# Patient Record
Sex: Female | Born: 1995 | Race: Black or African American | Hispanic: No | Smoking: Former smoker
Health system: Southern US, Community
[De-identification: ages and names within clinical notes are randomized; demographics above are authoritative.]

## PROBLEM LIST (undated history)

## (undated) ENCOUNTER — Inpatient Hospital Stay (HOSPITAL_COMMUNITY): Payer: Self-pay

## (undated) ENCOUNTER — Ambulatory Visit: Admission: EM

## (undated) DIAGNOSIS — N83209 Unspecified ovarian cyst, unspecified side: Secondary | ICD-10-CM

## (undated) DIAGNOSIS — R519 Headache, unspecified: Secondary | ICD-10-CM

## (undated) DIAGNOSIS — N719 Inflammatory disease of uterus, unspecified: Secondary | ICD-10-CM

## (undated) DIAGNOSIS — B999 Unspecified infectious disease: Secondary | ICD-10-CM

## (undated) DIAGNOSIS — K589 Irritable bowel syndrome without diarrhea: Secondary | ICD-10-CM

## (undated) DIAGNOSIS — I639 Cerebral infarction, unspecified: Secondary | ICD-10-CM

## (undated) DIAGNOSIS — D649 Anemia, unspecified: Secondary | ICD-10-CM

## (undated) DIAGNOSIS — K219 Gastro-esophageal reflux disease without esophagitis: Secondary | ICD-10-CM

## (undated) DIAGNOSIS — G43909 Migraine, unspecified, not intractable, without status migrainosus: Secondary | ICD-10-CM

## (undated) DIAGNOSIS — Z3490 Encounter for supervision of normal pregnancy, unspecified, unspecified trimester: Secondary | ICD-10-CM

## (undated) DIAGNOSIS — F419 Anxiety disorder, unspecified: Secondary | ICD-10-CM

## (undated) DIAGNOSIS — O139 Gestational [pregnancy-induced] hypertension without significant proteinuria, unspecified trimester: Secondary | ICD-10-CM

## (undated) DIAGNOSIS — Z8742 Personal history of other diseases of the female genital tract: Secondary | ICD-10-CM

## (undated) DIAGNOSIS — R112 Nausea with vomiting, unspecified: Secondary | ICD-10-CM

## (undated) DIAGNOSIS — R102 Pelvic and perineal pain: Principal | ICD-10-CM

## (undated) DIAGNOSIS — J45909 Unspecified asthma, uncomplicated: Secondary | ICD-10-CM

## (undated) HISTORY — DX: Unspecified asthma, uncomplicated: J45.909

## (undated) HISTORY — DX: Pelvic and perineal pain: R10.2

## (undated) HISTORY — DX: Unspecified ovarian cyst, unspecified side: N83.209

## (undated) HISTORY — DX: Inflammatory disease of uterus, unspecified: N71.9

## (undated) HISTORY — DX: Personal history of other diseases of the female genital tract: Z87.42

## (undated) HISTORY — PX: WISDOM TOOTH EXTRACTION: SHX21

## (undated) HISTORY — DX: Nausea with vomiting, unspecified: R11.2

## (undated) HISTORY — DX: Gastro-esophageal reflux disease without esophagitis: K21.9

## (undated) HISTORY — DX: Encounter for supervision of normal pregnancy, unspecified, unspecified trimester: Z34.90

## (undated) HISTORY — PX: OTHER SURGICAL HISTORY: SHX169

---
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
## 2013-01-09 ENCOUNTER — Ambulatory Visit (HOSPITAL_COMMUNITY)
Admission: RE | Admit: 2013-01-09 | Discharge: 2013-01-09 | Disposition: A | Discharge status: Home / Self Care | Payer: 59 | Source: Ambulatory Visit | Attending: Family Medicine | Admitting: Family Medicine

## 2013-01-09 ENCOUNTER — Other Ambulatory Visit: Payer: Self-pay | Admitting: Family Medicine

## 2013-01-09 DIAGNOSIS — M25569 Pain in unspecified knee: Secondary | ICD-10-CM

## 2013-01-09 DIAGNOSIS — M79609 Pain in unspecified limb: Secondary | ICD-10-CM | POA: Insufficient documentation

## 2013-01-11 ENCOUNTER — Telehealth: Payer: Self-pay | Admitting: Orthopedic Surgery

## 2013-01-11 NOTE — Telephone Encounter (Signed)
Received call this morning from [facility] at [facility] / Dr. Fletcher Anon office, ph [phone], asking to schedule an appointment per referral from Dr Lilyan Punt, for knee evaluation, earliest available, and for an afternoon.  I scheduled for Monday 01/16/13 which is the next afternoon Dr. Romeo Apple is available.  Patient was made aware.  Later this morning, Mom called to request an earlier date, as patient was really hurting.  We placed patient on office wait list if cancellation for sooner date.  Mom called back late this afternoon to relay that she has an appointment with another doctor, and requests to cancel appointment for Monday.  I called back to Dr. Fletcher Anon office and also to patient's Mom to offer appointment tomorrow morning, due to availability from a cancellation.  She states appreciates the call, however, elects to keep the other appointment for tomorrow.

## 2013-01-12 ENCOUNTER — Other Ambulatory Visit (HOSPITAL_COMMUNITY): Payer: Self-pay | Admitting: Orthopaedic Surgery

## 2013-01-12 DIAGNOSIS — M25569 Pain in unspecified knee: Secondary | ICD-10-CM

## 2013-01-16 ENCOUNTER — Ambulatory Visit: Payer: 59 | Admitting: Orthopedic Surgery

## 2013-01-17 ENCOUNTER — Ambulatory Visit (HOSPITAL_COMMUNITY)
Admission: RE | Admit: 2013-01-17 | Discharge: 2013-01-17 | Disposition: A | Discharge status: Home / Self Care | Payer: 59 | Source: Ambulatory Visit | Attending: Orthopaedic Surgery | Admitting: Orthopaedic Surgery

## 2013-01-17 DIAGNOSIS — R937 Abnormal findings on diagnostic imaging of other parts of musculoskeletal system: Secondary | ICD-10-CM | POA: Insufficient documentation

## 2013-01-17 DIAGNOSIS — M25569 Pain in unspecified knee: Secondary | ICD-10-CM | POA: Insufficient documentation

## 2013-01-17 MED ORDER — GADOBENATE DIMEGLUMINE 529 MG/ML IV SOLN
13.0000 mL | Freq: Once | INTRAVENOUS | Status: AC | PRN
  Administered 2013-01-17: 13 mL via INTRAVENOUS

## 2013-01-18 LAB — POCT I-STAT, CHEM 8
BUN: 4 mg/dL — ABNORMAL LOW (ref 6–23)
Calcium, Ion: 1.13 mmol/L (ref 1.12–1.23)
Hemoglobin: 13.3 g/dL (ref 12.0–15.0)
Sodium: 138 mEq/L (ref 135–145)
TCO2: 26 mmol/L (ref 0–100)

## 2013-03-27 ENCOUNTER — Encounter: Payer: Self-pay | Admitting: *Deleted

## 2013-03-27 DIAGNOSIS — J45909 Unspecified asthma, uncomplicated: Secondary | ICD-10-CM | POA: Insufficient documentation

## 2013-03-28 ENCOUNTER — Ambulatory Visit (INDEPENDENT_AMBULATORY_CARE_PROVIDER_SITE_OTHER): Payer: 59 | Admitting: Adult Health

## 2013-03-28 ENCOUNTER — Encounter: Payer: Self-pay | Admitting: Adult Health

## 2013-03-28 VITALS — BP 110/60 | Ht 63.0 in | Wt 150.0 lb

## 2013-03-28 DIAGNOSIS — Z3046 Encounter for surveillance of implantable subdermal contraceptive: Secondary | ICD-10-CM

## 2013-03-28 DIAGNOSIS — IMO0001 Reserved for inherently not codable concepts without codable children: Secondary | ICD-10-CM

## 2013-03-28 DIAGNOSIS — Z3009 Encounter for other general counseling and advice on contraception: Secondary | ICD-10-CM

## 2013-03-28 DIAGNOSIS — Z3202 Encounter for pregnancy test, result negative: Secondary | ICD-10-CM

## 2013-03-28 LAB — POCT URINE PREGNANCY: Preg Test, Ur: NEGATIVE

## 2013-03-28 MED ORDER — NORETHIN-ETH ESTRAD-FE BIPHAS 1 MG-10 MCG / 10 MCG PO TABS: 1.0000 | ORAL_TABLET | Freq: Every day | ORAL | Status: DC

## 2013-03-28 NOTE — Patient Instructions (Addendum)
Use condoms x 2 weeks, keep clean and dry x 24 hours, no heavy lifting, keep steri strips on x 72 hours, Keep pressure dressing on x 24 hours. Follow up prn problems. Start birth control pills today Sign up for my chart

## 2013-03-28 NOTE — Progress Notes (Signed)
Subjective:     Patient ID: Melissa Livingston, female   DOB: 1996/11/29, 17 y.o.   MRN: [id number]  HPI Melissa Livingston is a 17 year old in today to have her implanon removed and begin oral contraception   Review of SystemsNo complaints. Reviewed past medical,surgicall,social ,and family history.Reviewed medications and allergies.     Objective:   Physical Exam Blood pressure 110/60, height 5\' 3"  (1.6 m), weight 150 lb (68.04 kg), last menstrual period 03/21/2013.   Right arm cleansed with betadine, and injected with 1.5 cc 2% lidocaine and waited til numb. Under sterile technique a small vertical incision was made with #11 blade and implanon removed easily with forceps. Steri strips applied. Pressure dressing applied. Assessment:      Implanon removal Contraceptive management    Plan:       Use condoms x 2 weeks, keep clean and dry x 24 hours, no heavy lifting, keep steri strips on x 72 hours, Keep pressure dressing on x 24 hours. Follow up prn problems.   Start lo loestrin today Follow up 1 year

## 2013-04-04 ENCOUNTER — Telehealth: Payer: Self-pay | Admitting: Obstetrics and Gynecology

## 2013-04-04 NOTE — Telephone Encounter (Signed)
Left message. JSY 

## 2013-04-04 NOTE — Telephone Encounter (Signed)
Spoke with pt. Pt states she had her Implanon removed on 03/28/13 but still feels something in arm. Advised that it was more than likely scar tissue but pt wanted to schedule an appt to be checked. Appt scheduled.

## 2013-04-07 ENCOUNTER — Encounter: Payer: Self-pay | Admitting: Adult Health

## 2013-04-07 ENCOUNTER — Ambulatory Visit (INDEPENDENT_AMBULATORY_CARE_PROVIDER_SITE_OTHER): Payer: 59 | Admitting: Adult Health

## 2013-04-07 VITALS — BP 110/70 | Ht 63.0 in | Wt 153.0 lb

## 2013-04-07 DIAGNOSIS — R52 Pain, unspecified: Secondary | ICD-10-CM

## 2013-04-07 NOTE — Patient Instructions (Addendum)
Follow up prn Be patient, arm will feel better

## 2013-04-07 NOTE — Progress Notes (Signed)
Subjective:     Patient ID: Melissa Livingston, female   DOB: Aug 30, 1996, 17 y.o.   MRN: [id number]  HPI Melissa Livingston had the implanon removed 03/28/13 and has discomfort in right arm and says she feels the implanon even though she saw removed.  Review of Systems Positive as in HPI Reviewed past medical,surgical, social and family history. Reviewed medications and allergies.     Objective:   Physical Exam Blood pressure 110/70, height 5\' 3"  (1.6 m), weight 153 lb (69.4 kg), last menstrual period 03/21/2013. Right arm at removal site has steri strip in place,no redness or swelling, can feel ridge like scar tissue where rod removed.   Assessment:      Pain and scar tissue at implanon removal site    Plan:      Leave alone it will get better, call if any problems

## 2013-06-29 ENCOUNTER — Ambulatory Visit (INDEPENDENT_AMBULATORY_CARE_PROVIDER_SITE_OTHER): Payer: 59 | Admitting: Nurse Practitioner

## 2013-06-29 VITALS — BP 110/80 | Temp 98.3°F | Wt 144.2 lb

## 2013-06-29 DIAGNOSIS — R5383 Other fatigue: Secondary | ICD-10-CM

## 2013-06-29 DIAGNOSIS — K2990 Gastroduodenitis, unspecified, without bleeding: Secondary | ICD-10-CM

## 2013-06-29 DIAGNOSIS — K589 Irritable bowel syndrome without diarrhea: Secondary | ICD-10-CM

## 2013-06-29 DIAGNOSIS — K2970 Gastritis, unspecified, without bleeding: Secondary | ICD-10-CM

## 2013-06-29 DIAGNOSIS — F3289 Other specified depressive episodes: Secondary | ICD-10-CM

## 2013-06-29 DIAGNOSIS — F32A Depression, unspecified: Secondary | ICD-10-CM

## 2013-06-29 DIAGNOSIS — R5381 Other malaise: Secondary | ICD-10-CM

## 2013-06-29 DIAGNOSIS — K209 Esophagitis, unspecified without bleeding: Secondary | ICD-10-CM

## 2013-06-29 DIAGNOSIS — F329 Major depressive disorder, single episode, unspecified: Secondary | ICD-10-CM

## 2013-06-29 MED ORDER — CITALOPRAM HYDROBROMIDE 20 MG PO TABS: ORAL_TABLET | ORAL | Status: DC

## 2013-06-29 MED ORDER — PANTOPRAZOLE SODIUM 40 MG PO TBEC: 40.0000 mg | DELAYED_RELEASE_TABLET | Freq: Every day | ORAL | Status: DC

## 2013-06-29 MED ORDER — SUCRALFATE 1 GM/10ML PO SUSP: 1.0000 g | Freq: Three times a day (TID) | ORAL | Status: DC

## 2013-06-29 NOTE — Patient Instructions (Addendum)
Melatonin 5 mg Tylenol PM   Gastroesophageal Reflux Disease, Adult Gastroesophageal reflux disease (GERD) happens when acid from your stomach flows up into the esophagus. When acid comes in contact with the esophagus, the acid causes soreness (inflammation) in the esophagus. Over time, GERD may create small holes (ulcers) in the lining of the esophagus. CAUSES   Increased body weight. This puts pressure on the stomach, making acid rise from the stomach into the esophagus.  Smoking. This increases acid production in the stomach.  Drinking alcohol. This causes decreased pressure in the lower esophageal sphincter (valve or ring of muscle between the esophagus and stomach), allowing acid from the stomach into the esophagus.  Late evening meals and a full stomach. This increases pressure and acid production in the stomach.  A malformed lower esophageal sphincter. Sometimes, no cause is found. SYMPTOMS   Burning pain in the lower part of the mid-chest behind the breastbone and in the mid-stomach area. This may occur twice a week or more often.  Trouble swallowing.  Sore throat.  Dry cough.  Asthma-like symptoms including chest tightness, shortness of breath, or wheezing. DIAGNOSIS  Your caregiver may be able to diagnose GERD based on your symptoms. In some cases, X-rays and other tests may be done to check for complications or to check the condition of your stomach and esophagus. TREATMENT  Your caregiver may recommend over-the-counter or prescription medicines to help decrease acid production. Ask your caregiver before starting or adding any new medicines.  HOME CARE INSTRUCTIONS   Change the factors that you can control. Ask your caregiver for guidance concerning weight loss, quitting smoking, and alcohol consumption.  Avoid foods and drinks that make your symptoms worse, such as:  Caffeine or alcoholic drinks.  Chocolate.  Peppermint or mint flavorings.  Garlic and  onions.  Spicy foods.  Citrus fruits, such as oranges, lemons, or limes.  Tomato-based foods such as sauce, chili, salsa, and pizza.  Fried and fatty foods.  Avoid lying down for the 3 hours prior to your bedtime or prior to taking a nap.  Eat small, frequent meals instead of large meals.  Wear loose-fitting clothing. Do not wear anything tight around your waist that causes pressure on your stomach.  Raise the head of your bed 6 to 8 inches with wood blocks to help you sleep. Extra pillows will not help.  Only take over-the-counter or prescription medicines for pain, discomfort, or fever as directed by your caregiver.  Do not take aspirin, ibuprofen, or other nonsteroidal anti-inflammatory drugs (NSAIDs). SEEK IMMEDIATE MEDICAL CARE IF:   You have pain in your arms, neck, jaw, teeth, or back.  Your pain increases or changes in intensity or duration.  You develop nausea, vomiting, or sweating (diaphoresis).  You develop shortness of breath, or you faint.  Your vomit is green, yellow, black, or looks like coffee grounds or blood.  Your stool is red, bloody, or black. These symptoms could be signs of other problems, such as heart disease, gastric bleeding, or esophageal bleeding. MAKE SURE YOU:   Understand these instructions.  Will watch your condition.  Will get help right away if you are not doing well or get worse. Document Released: 09/02/2005 Document Revised: 02/15/2012 Document Reviewed: 06/12/2011 [facility] Patient Information 2014 [place], [place].

## 2013-07-03 ENCOUNTER — Encounter: Payer: Self-pay | Admitting: Nurse Practitioner

## 2013-07-03 DIAGNOSIS — K589 Irritable bowel syndrome without diarrhea: Secondary | ICD-10-CM | POA: Insufficient documentation

## 2013-07-03 DIAGNOSIS — K2970 Gastritis, unspecified, without bleeding: Secondary | ICD-10-CM | POA: Insufficient documentation

## 2013-07-03 DIAGNOSIS — F418 Other specified anxiety disorders: Secondary | ICD-10-CM | POA: Insufficient documentation

## 2013-07-03 DIAGNOSIS — F329 Major depressive disorder, single episode, unspecified: Secondary | ICD-10-CM | POA: Insufficient documentation

## 2013-07-03 NOTE — Assessment & Plan Note (Signed)
Prescribed Protonix and Carafate.

## 2013-07-03 NOTE — Assessment & Plan Note (Signed)
Start Celexa. Defers counseling at this point. Recheck in 3 weeks.

## 2013-07-03 NOTE — Progress Notes (Signed)
Subjective:  Presents with her mother for multiple complaints. Her mother relates most of patient's symptoms to her recent breakup with her boyfriend. Patient is adamant that this is not related but admits to sleep disturbance, weight loss, fatigue and emotional lability. No suicidal or homicidal thoughts or ideation. Mom has noticed a lot more irritation and fatigue. Complaints of epigastric area cramping and "a knot" worse after eating. Unassociated with any particular foods. Can last about an hour. Nausea but no vomiting. Having slight loose stools about twice a day. Normal color. No urinary symptoms. Drinks a lot of caffeine. Nonsmoker. No alcohol use. Has been taking NSAIDs on regular basis for some mild left knee pain. With abdominal discomfort, has a left midsternal tightness or achiness at the same time. No chest pain or shortness of breath with activity. Generalized headaches. Some body aches. Minimal cough. No sore throat. No wheezing.   Objective:   BP 110/80  Temp(Src) 98.3 F (36.8 C) (Oral)  Wt 144 lb 3.2 oz (65.409 kg)  BMI 25.55 kg/m2  LMP 06/18/2013  NAD. Alert, oriented. TMs normal limit. Pharynx clear moist. Neck supple with minimal adenopathy. Lungs clear. Heart regular rate rhythm. Abdomen soft nondistended with distinct epigastric area tenderness, otherwise nontender, no obvious organomegaly.   Assessment: Gastritis  Acute esophagitis  Irritable bowel syndrome  Other malaise and fatigue  Depression  Plan: Meds ordered this encounter  Medications  . citalopram (CELEXA) 20 MG tablet    Sig: 1/2 tab po q HS x 6 days then if needed increase to one tab po q HS    Dispense:  30 tablet    Refill:  0    Order Specific Question:  Supervising Provider    Answer:  Merlyn Albert [[id number]]  . pantoprazole (PROTONIX) 40 MG tablet    Sig: Take 1 tablet (40 mg total) by mouth daily. For acid reflux    Dispense:  30 tablet    Refill:  5    Order Specific Question:  Supervising  Provider    Answer:  Merlyn Albert [[id number]]  . sucralfate (CARAFATE) 1 GM/10ML suspension    Sig: Take 10 mLs (1 g total) by mouth 4 (four) times daily -  with meals and at bedtime.    Dispense:  420 mL    Refill:  2    Order Specific Question:  Supervising Provider    Answer:  Merlyn Albert [[id number]]   Cautioned about potential adverse effects from Celexa. DC med and call if any problems. Defers mental health counseling at this time. Otherwise recheck in 3 weeks. Slowly wean off caffeine.

## 2013-07-11 ENCOUNTER — Telehealth: Payer: Self-pay

## 2013-07-11 ENCOUNTER — Ambulatory Visit (INDEPENDENT_AMBULATORY_CARE_PROVIDER_SITE_OTHER): Payer: 59 | Admitting: Nurse Practitioner

## 2013-07-11 VITALS — BP 124/90 | Temp 99.0°F | Ht 64.0 in | Wt 138.0 lb

## 2013-07-11 DIAGNOSIS — K2970 Gastritis, unspecified, without bleeding: Secondary | ICD-10-CM

## 2013-07-11 DIAGNOSIS — R1032 Left lower quadrant pain: Secondary | ICD-10-CM

## 2013-07-11 DIAGNOSIS — R1011 Right upper quadrant pain: Secondary | ICD-10-CM

## 2013-07-11 DIAGNOSIS — K5900 Constipation, unspecified: Secondary | ICD-10-CM

## 2013-07-11 DIAGNOSIS — K5909 Other constipation: Secondary | ICD-10-CM

## 2013-07-11 DIAGNOSIS — R109 Unspecified abdominal pain: Secondary | ICD-10-CM

## 2013-07-11 DIAGNOSIS — R1013 Epigastric pain: Secondary | ICD-10-CM

## 2013-07-11 LAB — POCT URINE PREGNANCY: Preg Test, Ur: NEGATIVE

## 2013-07-11 NOTE — Patient Instructions (Addendum)
Increase Protonix 40 mg to twice a day Miralax as directed

## 2013-07-11 NOTE — Telephone Encounter (Signed)
Agree 

## 2013-07-11 NOTE — Telephone Encounter (Signed)
Pt's mom called and wanted to schedule OV. I told her that we are scheduling out several weeks. She said her daughter has acid reflux, but last night she had vomiting most of the night. This person never seen here before, but her mother is a pt. I told her that she probably should call and see PCP today and if they thought she needs an appt here sooner than a couple of weeks, they could let us know. She said OK.

## 2013-07-12 ENCOUNTER — Encounter: Payer: Self-pay | Admitting: Nurse Practitioner

## 2013-07-12 LAB — CBC WITH DIFFERENTIAL/PLATELET
Basophils Absolute: 0 10*3/uL (ref 0.0–0.1)
Eosinophils Relative: 1 % (ref 0–5)
Lymphocytes Relative: 25 % (ref 12–46)
MCV: 75 fL — ABNORMAL LOW (ref 78.0–100.0)
Monocytes Absolute: 0.9 10*3/uL (ref 0.1–1.0)
Monocytes Relative: 11 % (ref 3–12)
RBC: 5.48 MIL/uL — ABNORMAL HIGH (ref 3.87–5.11)
RDW: 16.3 % — ABNORMAL HIGH (ref 11.5–15.5)

## 2013-07-12 LAB — HEPATIC FUNCTION PANEL
ALT: <8 U/L (ref 0–35)
AST: 11 U/L (ref 0–37)
Alkaline Phosphatase: 72 U/L (ref 39–117)
Indirect Bilirubin: 1.1 mg/dL — ABNORMAL HIGH (ref 0.0–0.9)
Total Protein: 8.1 g/dL (ref 6.0–8.3)

## 2013-07-12 LAB — BASIC METABOLIC PANEL
BUN: 5 mg/dL — ABNORMAL LOW (ref 6–23)
Calcium: 10.2 mg/dL (ref 8.4–10.5)
Chloride: 100 mEq/L (ref 96–112)
Creat: 0.78 mg/dL (ref 0.50–1.10)

## 2013-07-12 NOTE — Assessment & Plan Note (Signed)
Plan: Given prescription for a GI cocktail with directions. Increase Protonix 40 mg 2 twice a day. Recommend bland diet. Warning signs reviewed with patient and her mother. Refer to GI specialist. Further followup based on test results, patient to call or go to ED sooner if worse.

## 2013-07-12 NOTE — Progress Notes (Signed)
Subjective:  Presents for recheck see previous note. Doing much better emotionally on Celexa 20 mg daily. Her reflux symptoms were much improved until 3 days ago. Began having nausea with occasional vomiting and decreased appetite. No fever. Some left lower quadrant pain usually worse with sitting up and after eating, unassociated with any particular foods. Some upper abdominal pain as well. Minimal relief with Carafate. Burning into the chest area. No caffeine intake. No urinary symptoms. No vaginal discharge. No pelvic pain. Has been having constipation since last visit, her mother states she has a history of chronic constipation since childhood. Her previous provider had her on MiraLAX frequently. No blood in her stool. No change in stool color.  Objective:   BP 124/90  Temp(Src) 99 F (37.2 C) (Oral)  Ht 5\' 4"  (1.626 m)  Wt 138 lb (62.596 kg)  BMI 23.68 kg/m2  LMP 06/18/2013 NAD. Alert, oriented. Calm affect. Lungs clear. Heart regular rate rhythm. Abdomen soft nondistended with active bowel sounds x4; distinct epigastric area tenderness with mild tenderness in the right upper quadrant. Also mild tenderness to deep palpation in the left lower quadrant. Some rebound tenderness in the lower abdomen, no obvious masses.  Assessment:Abdominal pain - Plan: POCT urine pregnancy, US Abdomen Complete  Abdominal pain, epigastric - Plan: Basic metabolic panel, CBC with Differential, Hepatic function panel, Lipase, Ambulatory referral to Gastroenterology, Basic metabolic panel, CBC with Differential, Hepatic function panel, Lipase  Abdominal pain, left lower quadrant - Plan: Basic metabolic panel, CBC with Differential, Ambulatory referral to Gastroenterology, Basic metabolic panel, CBC with Differential  Gastritis - Plan: Ambulatory referral to Gastroenterology  Constipation, chronic - Plan: Ambulatory referral to Gastroenterology  Abdominal pain, right upper quadrant - Plan: Hepatic function panel,  Lipase, Ambulatory referral to Gastroenterology, Hepatic function panel, Lipase   Plan: Given prescription for a GI cocktail with directions. Increase Protonix 40 mg 2 twice a day. Recommend bland diet. Warning signs reviewed with patient and her mother. Refer to GI specialist. Further followup based on test results, patient to call or go to ED sooner if worse.

## 2013-07-13 ENCOUNTER — Ambulatory Visit (HOSPITAL_COMMUNITY)
Admission: RE | Admit: 2013-07-13 | Discharge: 2013-07-13 | Disposition: A | Discharge status: Home / Self Care | Payer: 59 | Source: Ambulatory Visit | Attending: Nurse Practitioner | Admitting: Nurse Practitioner

## 2013-07-13 ENCOUNTER — Telehealth: Payer: Self-pay

## 2013-07-13 DIAGNOSIS — J45909 Unspecified asthma, uncomplicated: Secondary | ICD-10-CM | POA: Insufficient documentation

## 2013-07-13 DIAGNOSIS — R109 Unspecified abdominal pain: Secondary | ICD-10-CM | POA: Insufficient documentation

## 2013-07-13 NOTE — Telephone Encounter (Signed)
T/C from [place] at Dr. Fletcher Anon office. She said pt was seen there on 07/11/2013  ( see our phone note of 07/11/2013 ). [place] called for Sherie Don to see if pt can have appt earlier than a couple of weeks out. Pt is having [place] today. Enid Derry is going to tell pt's mom that we will be able to see the results and determine when she needs to be seen here. Routing to Gerrit Halls, NP to advise!

## 2013-07-13 NOTE — Telephone Encounter (Signed)
I called pt's mom and offered the 10:30 urgent slot on mon 07/17/2013 and she said she has an appt that day. Appt is scheduled for 07/18/2013 at 10:30 AM with Tana Coast, NP, AS is off next week.

## 2013-07-13 NOTE — Telephone Encounter (Signed)
We can see her in an urgent next week.

## 2013-07-14 NOTE — Telephone Encounter (Signed)
Noted  

## 2013-07-18 ENCOUNTER — Other Ambulatory Visit: Payer: Self-pay | Admitting: Internal Medicine

## 2013-07-18 ENCOUNTER — Encounter (HOSPITAL_COMMUNITY): Payer: Self-pay | Admitting: Pharmacy Technician

## 2013-07-18 ENCOUNTER — Ambulatory Visit (INDEPENDENT_AMBULATORY_CARE_PROVIDER_SITE_OTHER): Payer: 59 | Admitting: Gastroenterology

## 2013-07-18 ENCOUNTER — Encounter: Payer: Self-pay | Admitting: Gastroenterology

## 2013-07-18 VITALS — BP 125/82 | HR 116 | Temp 98.1°F | Ht 64.0 in | Wt 138.0 lb

## 2013-07-18 DIAGNOSIS — R1013 Epigastric pain: Secondary | ICD-10-CM

## 2013-07-18 DIAGNOSIS — K589 Irritable bowel syndrome without diarrhea: Secondary | ICD-10-CM

## 2013-07-18 DIAGNOSIS — K625 Hemorrhage of anus and rectum: Secondary | ICD-10-CM | POA: Insufficient documentation

## 2013-07-18 DIAGNOSIS — R1314 Dysphagia, pharyngoesophageal phase: Secondary | ICD-10-CM

## 2013-07-18 DIAGNOSIS — R1319 Other dysphagia: Secondary | ICD-10-CM | POA: Insufficient documentation

## 2013-07-18 DIAGNOSIS — R1310 Dysphagia, unspecified: Secondary | ICD-10-CM | POA: Insufficient documentation

## 2013-07-18 DIAGNOSIS — K219 Gastro-esophageal reflux disease without esophagitis: Secondary | ICD-10-CM

## 2013-07-18 HISTORY — DX: Hemorrhage of anus and rectum: K62.5

## 2013-07-18 NOTE — Progress Notes (Signed)
Primary Care Physician:  Lilyan Punt, MD  Primary Gastroenterologist:  Roetta Sessions, MD    Chief Complaint  Patient presents with  . Abdominal Pain    all over    HPI:  Melissa Livingston is a 17 y.o. female here for an urgent office visit to further evaluate abdominal pain at the request of Sherie Don, NP with Dr. Gerda Diss.   Please note, patient's mother gave much of the history today. I was able to have patient answer questions on her own as well. She complains of several month history of abdominal pain both in the upper abdomen and left lower quadrant. Pain is there most of the time but worse with meals. Nothing seems to make the pain better. She notes that the pain can be brought on after taking Relafen for knee injury that she developed while in the [facility]. She was discharged from service in January and has been home since that time. She has been followed by Dr. Hilda Lias for her knee injury. Patient admits to depression related to her injury and discharge from the service. She also broke up with her boyfriend during this time.   Weight down 16 pounds in last few weeks. C/o low grade 99.6. No appetite. On NSAIDs since January for knee. Implanon removed and now on OCP 03/2013. No heavy menses. Some nausea. Vomited twice last week. Lots of heartburn. Some esophageal dysphagia for couple of weeks. Some pain with swallowing. Usually BM once per day. Historically has had chronic constipation since childhood. Previously used MiraLax about 2-3 times per month. Intermittent bouts of diarrhea requiring Imodium at times. Over the past several months she has had change in her stools. She has loose stool up to three times a day approximately twice per week, but still has bouts of constipation as well.  Does not look at stool. Some brbpr on toilet tissue, with hard stool, about 4 times.  Recently started on pantoprazole, Carafate, Celexa and GI cocktail. Denies any notable improvement thus far.    Patient requested Dr. Jena Gauss has mother is his patient as well.  Current Outpatient Prescriptions  Medication Sig Dispense Refill  . citalopram (CELEXA) 20 MG tablet 1/2 tab po q HS x 6 days then if needed increase to one tab po q HS  30 tablet  0  . Norethindrone-Ethinyl Estradiol-Fe Biphas (LO LOESTRIN FE) 1 MG-10 MCG / 10 MCG tablet Take 1 tablet by mouth daily.  1 Package  11  . pantoprazole (PROTONIX) 40 MG tablet Take 1 tablet (40 mg total) by mouth daily. For acid reflux  30 tablet  5  . sucralfate (CARAFATE) 1 GM/10ML suspension Take 10 mLs (1 g total) by mouth 4 (four) times daily -  with meals and at bedtime.  420 mL  2   No current facility-administered medications for this visit.    Allergies as of 07/18/2013 - Review Complete 07/18/2013  Allergen Reaction Noted  . Lorabid (loracarbef) Swelling 03/28/2013    Past Medical History  Diagnosis Date  . Asthma     Past Surgical History  Procedure Laterality Date  . None      Family History  Problem Relation Age of Onset  . Coronary artery disease Other   . Hypertension Other   . Diabetes Other   . Multiple sclerosis Mother   . Colon cancer Other     maternal great uncle, age greater than 37  . Inflammatory bowel disease Other     Crohn's, second cousin  History   Social History  . Marital Status: Single    Spouse Name: N/A    Number of Children: 0  . Years of Education: N/A   Occupational History  . out due to knee   .  [employer]   Social History Main Topics  . Smoking status: Never Smoker   . Smokeless tobacco: Never Used  . Alcohol Use: No  . Drug Use: No  . Sexually Active: Yes    Birth Control/ Protection: Pill   Other Topics Concern  . Not on file   Social History Narrative  . No narrative on file      ROS:  General: Negative for fatigue, weakness. See history of present illness.  Eyes: Negative for vision changes.  ENT: Negative for hoarseness,  nasal congestion. CV: Negative for  chest pain, angina, palpitations, dyspnea on exertion, peripheral edema.  Respiratory: Negative for dyspnea at rest, dyspnea on exertion, cough, sputum, wheezing.  GI: See history of present illness. GU:  Negative for dysuria, hematuria, urinary incontinence, urinary frequency, nocturnal urination.  MS: Negative for joint pain, low back pain.  Derm: Negative for rash or itching.  Neuro: Negative for weakness, abnormal sensation, seizure, frequent headaches, memory loss, confusion.  Psych: Negative for anxiety, depression, suicidal ideation, hallucinations.  Endo: See history of present illness. Heme: Negative for bruising or bleeding. Allergy: Negative for rash or hives.    Physical Examination:  BP 125/82  Pulse 116  Temp(Src) 98.1 F (36.7 C) (Oral)  Ht 5\' 4"  (1.626 m)  Wt 138 lb (62.596 kg)  BMI 23.68 kg/m2  LMP 07/18/2013   General: Well-nourished, well-developed in no acute distress. Accompanied by mother. Head: Normocephalic, atraumatic.   Eyes: Conjunctiva pink, no icterus. Mouth: Oropharyngeal mucosa moist and pink , no lesions erythema or exudate. Neck: Supple without thyromegaly, masses, or lymphadenopathy.  Lungs: Clear to auscultation bilaterally.  Heart: Regular rate and rhythm, no murmurs rubs or gallops.  Abdomen: Bowel sounds are normal, diffuse abdominal tenderness but increased in the epigastrium. nondistended, no hepatosplenomegaly or masses, no abdominal bruits or    hernia , no rebound or guarding.   Rectal: not performed Extremities: No lower extremity edema. No clubbing or deformities.  Neuro: Alert and oriented x 4 , grossly normal neurologically.  Skin: Warm and dry, no rash or jaundice.   Psych: Alert and cooperative, flat affect. Tearful.  Labs: Lab Results  Component Value Date   CREATININE 0.78 07/11/2013   BUN 5* 07/11/2013   NA 137 07/11/2013   K 4.3 07/11/2013   CL 100 07/11/2013   CO2 26 07/11/2013   Lab Results  Component Value Date   WBC 8.4  07/11/2013   HGB 13.6 07/11/2013   HCT 41.1 07/11/2013   MCV 75.0* 07/11/2013   PLT 330 07/11/2013   Lab Results  Component Value Date   ALT <8 07/11/2013   AST 11 07/11/2013   ALKPHOS 72 07/11/2013   BILITOT 1.3* 07/11/2013   Lab Results  Component Value Date   LIPASE 27 07/11/2013     Imaging Studies: US Abdomen Complete  07/13/2013   *RADIOLOGY REPORT*  Clinical Data:  Abdominal pain, history asthma  ULTRASOUND ABDOMEN:  Technique:  Sonography of upper abdominal structures was performed.  Comparison:  None  Gallbladder:  Normally distended without stones or wall thickening. No pericholecystic fluid or sonographic Murphy sign.  Common bile duct:  Normal caliber 3 mm diameter  Liver:  Normal appearance  IVC:  Normal appearance  Pancreas:  Normal appearance  Spleen:  Normal appearance, 5.7 cm length  Right kidney:  9.9 cm length. Normal morphology without mass or hydronephrosis.  Left kidney:  9.9 cm length. Normal morphology without mass or hydronephrosis.  5 mm nonspecific nonshadowing echogenic focus at renal sinus fat in mid kidney.  No shadowing calculi.  Aorta:  Normal caliber  Other:  No free fluid  IMPRESSION: No acute upper abdominal sonographic abnormalities.   Original Report Authenticated By: Ulyses Southward, M.D.

## 2013-07-18 NOTE — Assessment & Plan Note (Signed)
17 year old lady with multiple GI issues which seem to have started after chronic NSAID use for knee injury. She complains of epigastric pain, worse with meals, 16 pound weight loss due to anorexia and decreased oral intake, heartburn, solid food esophageal dysphagia. Differential diagnosis includes gastritis, peptic ulcer disease, esophagitis, esophageal stricture. Recommend upper endoscopy for further evaluation.  I have discussed the risks, alternatives, benefits with regards to but not limited to the risk of reaction to medication, bleeding, infection, perforation and the patient is agreeable to proceed. Written consent to be obtained.  Continue pantoprazole daily. May continue Carafate as needed for now.

## 2013-07-18 NOTE — Progress Notes (Signed)
CC'd to PCP 

## 2013-07-18 NOTE — Patient Instructions (Addendum)
1. Continue pantoprazole 40 mg daily. 2. Continue Carafate as needed. 3. We have scheduled you for an upper endoscopy to further evaluate your symptoms. Please see separate instructions.

## 2013-07-18 NOTE — Assessment & Plan Note (Addendum)
Suspect irritable bowel syndrome. She has had a tendency more towards loose stools the last several months which may be due to IBS versus NSAID-induced enteropathy. IBD not excluded. Occasional bright red blood per rectum with hard stool likely benign anorectal source. I had a long discussion with the patient and her mother today. We offered colonoscopy for further evaluation of bowel change and bright red blood per but they're not interested at this time. They would like to pursue upper endoscopy initially as I feel she would not to operate the bowel prep given her nausea and diminished oral intake. We can readdress at a later date if necessary.  We will hold off on antispasmotics at this time as it may make her upper GI symptoms worse.

## 2013-07-19 ENCOUNTER — Ambulatory Visit (HOSPITAL_COMMUNITY)
Admission: RE | Admit: 2013-07-19 | Discharge: 2013-07-19 | Disposition: A | Discharge status: Home / Self Care | Payer: 59 | Source: Ambulatory Visit | Attending: Internal Medicine | Admitting: Internal Medicine

## 2013-07-19 ENCOUNTER — Encounter (HOSPITAL_COMMUNITY): Payer: Self-pay | Admitting: *Deleted

## 2013-07-19 ENCOUNTER — Encounter (HOSPITAL_COMMUNITY)
Admission: RE | Disposition: A | Discharge status: Home / Self Care | Payer: Self-pay | Source: Ambulatory Visit | Attending: Internal Medicine

## 2013-07-19 DIAGNOSIS — K3189 Other diseases of stomach and duodenum: Secondary | ICD-10-CM

## 2013-07-19 DIAGNOSIS — Z800 Family history of malignant neoplasm of digestive organs: Secondary | ICD-10-CM | POA: Insufficient documentation

## 2013-07-19 DIAGNOSIS — K219 Gastro-esophageal reflux disease without esophagitis: Secondary | ICD-10-CM | POA: Insufficient documentation

## 2013-07-19 DIAGNOSIS — Z8379 Family history of other diseases of the digestive system: Secondary | ICD-10-CM | POA: Insufficient documentation

## 2013-07-19 DIAGNOSIS — R1013 Epigastric pain: Secondary | ICD-10-CM

## 2013-07-19 DIAGNOSIS — K589 Irritable bowel syndrome without diarrhea: Secondary | ICD-10-CM | POA: Insufficient documentation

## 2013-07-19 DIAGNOSIS — K449 Diaphragmatic hernia without obstruction or gangrene: Secondary | ICD-10-CM | POA: Insufficient documentation

## 2013-07-19 DIAGNOSIS — Z79899 Other long term (current) drug therapy: Secondary | ICD-10-CM | POA: Insufficient documentation

## 2013-07-19 DIAGNOSIS — J45909 Unspecified asthma, uncomplicated: Secondary | ICD-10-CM | POA: Insufficient documentation

## 2013-07-19 DIAGNOSIS — R1084 Generalized abdominal pain: Secondary | ICD-10-CM | POA: Insufficient documentation

## 2013-07-19 DIAGNOSIS — R1310 Dysphagia, unspecified: Secondary | ICD-10-CM | POA: Insufficient documentation

## 2013-07-19 HISTORY — PX: ESOPHAGOGASTRODUODENOSCOPY (EGD) WITH ESOPHAGEAL DILATION: SHX5812

## 2013-07-19 SURGERY — ESOPHAGOGASTRODUODENOSCOPY (EGD) WITH ESOPHAGEAL DILATION
Anesthesia: Moderate Sedation

## 2013-07-19 MED ORDER — MIDAZOLAM HCL 5 MG/5ML IJ SOLN
INTRAMUSCULAR | Status: AC
  Filled 2013-07-19: qty 10

## 2013-07-19 MED ORDER — BUTAMBEN-TETRACAINE-BENZOCAINE 2-2-14 % EX AERO
INHALATION_SPRAY | CUTANEOUS | Status: DC | PRN
  Administered 2013-07-19: 2 via TOPICAL

## 2013-07-19 MED ORDER — ONDANSETRON HCL 4 MG/2ML IJ SOLN
INTRAMUSCULAR | Status: DC | PRN
  Administered 2013-07-19: 4 mg via INTRAVENOUS

## 2013-07-19 MED ORDER — STERILE WATER FOR IRRIGATION IR SOLN
Status: DC | PRN
  Administered 2013-07-19: 14:00:00

## 2013-07-19 MED ORDER — MIDAZOLAM HCL 5 MG/5ML IJ SOLN
INTRAMUSCULAR | Status: DC | PRN
  Administered 2013-07-19 (×2): 2 mg via INTRAVENOUS
  Administered 2013-07-19 (×2): 1 mg via INTRAVENOUS

## 2013-07-19 MED ORDER — MEPERIDINE HCL 100 MG/ML IJ SOLN
INTRAMUSCULAR | Status: DC | PRN
  Administered 2013-07-19 (×2): 50 mg via INTRAVENOUS
  Administered 2013-07-19: 25 mg via INTRAVENOUS

## 2013-07-19 MED ORDER — ONDANSETRON HCL 4 MG/2ML IJ SOLN
INTRAMUSCULAR | Status: AC
  Filled 2013-07-19: qty 2

## 2013-07-19 MED ORDER — MEPERIDINE HCL 100 MG/ML IJ SOLN
INTRAMUSCULAR | Status: AC
  Filled 2013-07-19: qty 2

## 2013-07-19 NOTE — Op Note (Signed)
[facility] [address] [place], [address]   ENDOSCOPY PROCEDURE REPORT  PATIENT: Melissa Livingston, Melissa Livingston  MR#: [id number] BIRTHDATE: January 31, 1996 , 17  yrs. old GENDER: Female ENDOSCOPIST: R.  Roetta Sessions, MD FACP FACG REFERRED BY:  Lilyan Punt, M.D. PROCEDURE DATE:  07/19/2013 PROCEDURE:     EGD with Elease Hashimoto dilation followed by esophageal biopsy  INDICATIONS:     dyspepsia/epigastric pain/esophageal dysphagia  INFORMED CONSENT:   The risks, benefits, limitations, alternatives and imponderables have been discussed.  The potential for biopsy, esophogeal dilation, etc. have also been reviewed.  Questions have been answered.  All parties agreeable.  Please see the history and physical in the medical record for more information.  MEDICATIONS:    Versed 6 mg IV and Demerol 125 mg IV in divided doses. Cetacaine spray. Zofran 4 mg IV  DESCRIPTION OF PROCEDURE:   The EG-[id number] ([id number])  endoscope was introduced through the mouth and advanced to the second portion of the duodenum without difficulty or limitations.  The mucosal surfaces were surveyed very carefully during advancement of the scope and upon withdrawal.  Retroflexion view of the proximal stomach and esophagogastric junction was performed.      FINDINGS: Grossly normal-appearing esophagus. Stomach empty. Tiny hiatal hernia. Normal gastric mucosa. Patent pylorus. Normal first and second portion of the duodenum.  THERAPEUTIC / DIAGNOSTIC MANEUVERS PERFORMED:  A 52 French Maloney dilator was passed to full insertion easily. A look back revealed a superficial tear through the upper esophageal sphincter mucosa but otherwise normal. No complication related to this maneuver. Subsequently, biopsies of the mid and distal esophageal mucosa were taken for histologic study.   COMPLICATIONS:  None  IMPRESSION:   Normal esophagus-status post Maloney dilation biopsy. Tiny hiatal hernia; otherwise normal  examination  RECOMMENDATIONS:    Continue pantoprazole. Continue Carafate as needed.  Followup on pathology    _______________________________ R. Roetta Sessions, MD FACP [facility] eSigned:  R. Roetta Sessions, MD FACP [facility] 07/19/2013 3:40 PM     CC:

## 2013-07-19 NOTE — H&P (View-Only) (Signed)
Primary Care Physician:  LUKING,SCOTT, MD  Primary Gastroenterologist:  Michael Rourk, MD    Chief Complaint  Patient presents with  . Abdominal Pain    all over    HPI:  Melissa Livingston is a 17 y.o. female here for an urgent office visit to further evaluate abdominal pain at the request of Carolyn Hoskins, NP with Dr. Luking.   Please note, patient's mother gave much of the history today. I was able to have patient answer questions on her own as well. She complains of several month history of abdominal pain both in the upper abdomen and left lower quadrant. Pain is there most of the time but worse with meals. Nothing seems to make the pain better. She notes that the pain can be brought on after taking Relafen for knee injury that she developed while in the [employer]. She was discharged from service in January and has been home since that time. She has been followed by Dr. Keeling for her knee injury. Patient admits to depression related to her injury and discharge from the service. She also broke up with her boyfriend during this time.   Weight down 16 pounds in last few weeks. C/o low grade 99.6. No appetite. On NSAIDs since January for knee. Implanon removed and now on OCP 03/2013. No heavy menses. Some nausea. Vomited twice last week. Lots of heartburn. Some esophageal dysphagia for couple of weeks. Some pain with swallowing. Usually BM once per day. Historically has had chronic constipation since childhood. Previously used MiraLax about 2-3 times per month. Intermittent bouts of diarrhea requiring Imodium at times. Over the past several months she has had change in her stools. She has loose stool up to three times a day approximately twice per week, but still has bouts of constipation as well.  Does not look at stool. Some brbpr on toilet tissue, with hard stool, about 4 times.  Recently started on pantoprazole, Carafate, Celexa and GI cocktail. Denies any notable improvement thus far.    Patient requested Dr. Rourk has mother is his patient as well.  Current Outpatient Prescriptions  Medication Sig Dispense Refill  . citalopram (CELEXA) 20 MG tablet 1/2 tab po q HS x 6 days then if needed increase to one tab po q HS  30 tablet  0  . Norethindrone-Ethinyl Estradiol-Fe Biphas (LO LOESTRIN FE) 1 MG-10 MCG / 10 MCG tablet Take 1 tablet by mouth daily.  1 Package  11  . pantoprazole (PROTONIX) 40 MG tablet Take 1 tablet (40 mg total) by mouth daily. For acid reflux  30 tablet  5  . sucralfate (CARAFATE) 1 GM/10ML suspension Take 10 mLs (1 g total) by mouth 4 (four) times daily -  with meals and at bedtime.  420 mL  2   No current facility-administered medications for this visit.    Allergies as of 07/18/2013 - Review Complete 07/18/2013  Allergen Reaction Noted  . Lorabid (loracarbef) Swelling 03/28/2013    Past Medical History  Diagnosis Date  . Asthma     Past Surgical History  Procedure Laterality Date  . None      Family History  Problem Relation Age of Onset  . Coronary artery disease Other   . Hypertension Other   . Diabetes Other   . Multiple sclerosis Mother   . Colon cancer Other     maternal great uncle, age greater than 60  . Inflammatory bowel disease Other     Crohn's, second cousin      History   Social History  . Marital Status: Single    Spouse Name: N/A    Number of Children: 0  . Years of Education: N/A   Occupational History  . out due to knee   .  [employer]   Social History Main Topics  . Smoking status: Never Smoker   . Smokeless tobacco: Never Used  . Alcohol Use: No  . Drug Use: No  . Sexually Active: Yes    Birth Control/ Protection: Pill   Other Topics Concern  . Not on file   Social History Narrative  . No narrative on file      ROS:  General: Negative for fatigue, weakness. See history of present illness.  Eyes: Negative for vision changes.  ENT: Negative for hoarseness,  nasal congestion. CV: Negative for  chest pain, angina, palpitations, dyspnea on exertion, peripheral edema.  Respiratory: Negative for dyspnea at rest, dyspnea on exertion, cough, sputum, wheezing.  GI: See history of present illness. GU:  Negative for dysuria, hematuria, urinary incontinence, urinary frequency, nocturnal urination.  MS: Negative for joint pain, low back pain.  Derm: Negative for rash or itching.  Neuro: Negative for weakness, abnormal sensation, seizure, frequent headaches, memory loss, confusion.  Psych: Negative for anxiety, depression, suicidal ideation, hallucinations.  Endo: See history of present illness. Heme: Negative for bruising or bleeding. Allergy: Negative for rash or hives.    Physical Examination:  BP 125/82  Pulse 116  Temp(Src) 98.1 F (36.7 C) (Oral)  Ht 5' 4" (1.626 m)  Wt 138 lb (62.596 kg)  BMI 23.68 kg/m2  LMP 07/18/2013   General: Well-nourished, well-developed in no acute distress. Accompanied by mother. Head: Normocephalic, atraumatic.   Eyes: Conjunctiva pink, no icterus. Mouth: Oropharyngeal mucosa moist and pink , no lesions erythema or exudate. Neck: Supple without thyromegaly, masses, or lymphadenopathy.  Lungs: Clear to auscultation bilaterally.  Heart: Regular rate and rhythm, no murmurs rubs or gallops.  Abdomen: Bowel sounds are normal, diffuse abdominal tenderness but increased in the epigastrium. nondistended, no hepatosplenomegaly or masses, no abdominal bruits or    hernia , no rebound or guarding.   Rectal: not performed Extremities: No lower extremity edema. No clubbing or deformities.  Neuro: Alert and oriented x 4 , grossly normal neurologically.  Skin: Warm and dry, no rash or jaundice.   Psych: Alert and cooperative, flat affect. Tearful.  Labs: Lab Results  Component Value Date   CREATININE 0.78 07/11/2013   BUN 5* 07/11/2013   NA 137 07/11/2013   K 4.3 07/11/2013   CL 100 07/11/2013   CO2 26 07/11/2013   Lab Results  Component Value Date   WBC 8.4  07/11/2013   HGB 13.6 07/11/2013   HCT 41.1 07/11/2013   MCV 75.0* 07/11/2013   PLT 330 07/11/2013   Lab Results  Component Value Date   ALT <8 07/11/2013   AST 11 07/11/2013   ALKPHOS 72 07/11/2013   BILITOT 1.3* 07/11/2013   Lab Results  Component Value Date   LIPASE 27 07/11/2013     Imaging Studies: Us Abdomen Complete  07/13/2013   *RADIOLOGY REPORT*  Clinical Data:  Abdominal pain, history asthma  ULTRASOUND ABDOMEN:  Technique:  Sonography of upper abdominal structures was performed.  Comparison:  None  Gallbladder:  Normally distended without stones or wall thickening. No pericholecystic fluid or sonographic Murphy sign.  Common bile duct:  Normal caliber 3 mm diameter  Liver:  Normal appearance  IVC:  Normal appearance    Pancreas:  Normal appearance  Spleen:  Normal appearance, 5.7 cm length  Right kidney:  9.9 cm length. Normal morphology without mass or hydronephrosis.  Left kidney:  9.9 cm length. Normal morphology without mass or hydronephrosis.  5 mm nonspecific nonshadowing echogenic focus at renal sinus fat in mid kidney.  No shadowing calculi.  Aorta:  Normal caliber  Other:  No free fluid  IMPRESSION: No acute upper abdominal sonographic abnormalities.   Original Report Authenticated By: Mark Boles, M.D.      

## 2013-07-19 NOTE — Interval H&P Note (Signed)
History and Physical Interval Note:  07/19/2013 2:25 PM  Melissa Livingston  has presented today for surgery, with the diagnosis of EPIGASTRIC PAIN, GERD ESOPHAGEAL DYSPHAGIA , IBS  The various methods of treatment have been discussed with the patient and family. After consideration of risks, benefits and other options for treatment, the patient has consented to  Procedure(s) with comments: ESOPHAGOGASTRODUODENOSCOPY (EGD) WITH ESOPHAGEAL DILATION (N/A) - 2:45 as a surgical intervention .  The patient's history has been reviewed, patient examined, no change in status, stable for surgery.  I have reviewed the patient's chart and labs.  Questions were answered to the patient's satisfaction.     [name]  No change. As above.

## 2013-07-20 ENCOUNTER — Ambulatory Visit: Payer: 59 | Admitting: Nurse Practitioner

## 2013-07-20 ENCOUNTER — Telehealth: Payer: Self-pay | Admitting: *Deleted

## 2013-07-20 NOTE — Telephone Encounter (Signed)
Spoke with pt- she still having some soreness in her throat from her procedure. Advised her to use chloraseptic spray, drink plenty of fluids and to eat soft foods, avoid anything hard like potato chips etc, and to call back if its not better in a couple of days. Pt verbalized understanding.  Pt stated she was still having some problems with her stomach and it was uncomfortable. She wants a note to be out of work until Monday. I advised her that we normally dont give work notes for that long but she wanted me to ask RMR and see if we could.

## 2013-07-20 NOTE — Telephone Encounter (Signed)
Pt's mother called, pt had a procedure done yesterday with Dr. Jena Gauss pt's mother states pt's throat is still sore and pt is wanting a note to return to work on Monday. Please advise [phone]

## 2013-07-21 ENCOUNTER — Encounter (HOSPITAL_COMMUNITY): Payer: Self-pay | Admitting: Internal Medicine

## 2013-07-21 NOTE — Telephone Encounter (Signed)
I tried to call pt- LMOM informed her that letter was done and reminded her that we closed today at 12:00. Also called mothers cell number and LM on her voicemail with the same information.

## 2013-07-21 NOTE — Telephone Encounter (Signed)
Per LSL- pt can have a work note for the day of procedure and the day after. Pt is aware.

## 2013-07-24 ENCOUNTER — Ambulatory Visit: Payer: 59 | Admitting: Nurse Practitioner

## 2013-07-24 ENCOUNTER — Encounter: Payer: Self-pay | Admitting: Nurse Practitioner

## 2013-07-26 ENCOUNTER — Ambulatory Visit: Payer: 59 | Admitting: Nurse Practitioner

## 2013-08-02 ENCOUNTER — Other Ambulatory Visit: Payer: Self-pay | Admitting: Nurse Practitioner

## 2013-08-12 ENCOUNTER — Encounter: Payer: Self-pay | Admitting: Internal Medicine

## 2013-09-25 ENCOUNTER — Ambulatory Visit (INDEPENDENT_AMBULATORY_CARE_PROVIDER_SITE_OTHER): Payer: 59 | Admitting: Family Medicine

## 2013-09-25 ENCOUNTER — Encounter: Payer: Self-pay | Admitting: Family Medicine

## 2013-09-25 VITALS — BP 122/82 | Temp 100.1°F | Ht 63.5 in | Wt 141.2 lb

## 2013-09-25 DIAGNOSIS — J029 Acute pharyngitis, unspecified: Secondary | ICD-10-CM

## 2013-09-25 LAB — CBC WITH DIFFERENTIAL/PLATELET
Basophils Relative: 0 % (ref 0–1)
HCT: 32.8 % — ABNORMAL LOW (ref 36.0–46.0)
Hemoglobin: 10.9 g/dL — ABNORMAL LOW (ref 12.0–15.0)
Lymphocytes Relative: 15 % (ref 12–46)
Lymphs Abs: 2 10*3/uL (ref 0.7–4.0)
MCHC: 33.2 g/dL (ref 30.0–36.0)
Monocytes Absolute: 1.3 10*3/uL — ABNORMAL HIGH (ref 0.1–1.0)
Monocytes Relative: 10 % (ref 3–12)
Neutro Abs: 10.2 10*3/uL — ABNORMAL HIGH (ref 1.7–7.7)
Neutrophils Relative %: 74 % (ref 43–77)
RBC: 4.3 MIL/uL (ref 3.87–5.11)

## 2013-09-25 MED ORDER — AZITHROMYCIN 250 MG PO TABS: ORAL_TABLET | ORAL | Status: DC

## 2013-09-25 NOTE — Addendum Note (Signed)
Addended by: Margaretha Sheffield on: 09/25/2013 04:51 PM   Modules accepted: Orders

## 2013-09-25 NOTE — Progress Notes (Signed)
  Subjective:    Patient ID: Melissa Livingston, female    DOB: [birth year], 17 y.o.   MRN: [id number]  Sore Throat  This is a new problem. The current episode started 1 to 4 weeks ago. The problem has been gradually worsening. Associated symptoms include congestion, swollen glands and trouble swallowing.   Painful swollen glands,  Sig headache back part  Some right ear pain  Results for orders placed in visit on 09/25/13  POCT RAPID STREP A (OFFICE)      Result Value Range   Rapid Strep A Screen Negative  Negative    Review of Systems  HENT: Positive for congestion and trouble swallowing.    no vomiting or diarrhea significant fatigue.     Objective:   Physical Exam  Alert hydration adequate. HEENT slight nasal congestion. Exudative tonsillitis 7 very impressive swelling and exudate tender anterior nodes. Neck supple lungs clear heart rare in rhythm.      Assessment & Plan:  Severe exudative tonsillitis may represent Monda discussed plan Z-Pak. CBC Mono spot test. Hycodan cough medicine for pain. Symptomatic care discussed. WSL

## 2013-09-26 LAB — MONONUCLEOSIS SCREEN: Mono Screen: POSITIVE — AB

## 2013-09-26 LAB — STREP A DNA PROBE: GASP: NEGATIVE

## 2013-09-27 ENCOUNTER — Other Ambulatory Visit: Payer: Self-pay | Admitting: *Deleted

## 2013-09-27 MED ORDER — PREDNISONE 20 MG PO TABS: ORAL_TABLET | ORAL | Status: DC

## 2013-09-27 NOTE — Progress Notes (Signed)
Spoke with patient's mom regarding: Call pt. Pos for mono. Start pred 20 three qd for three d two qd for three d one qd for two d. Tell her steroids help pain and swelling a lot. May stop zpk Avoid strenouos activity. Re ck next wk in office. Verbalized understanding.

## 2013-10-06 ENCOUNTER — Ambulatory Visit: Payer: 59 | Admitting: Family Medicine

## 2014-01-25 ENCOUNTER — Encounter: Payer: Self-pay | Admitting: Nurse Practitioner

## 2014-01-25 ENCOUNTER — Ambulatory Visit (INDEPENDENT_AMBULATORY_CARE_PROVIDER_SITE_OTHER): Payer: 59 | Admitting: Nurse Practitioner

## 2014-01-25 ENCOUNTER — Encounter: Payer: Self-pay | Admitting: Family Medicine

## 2014-01-25 VITALS — BP 112/82 | Ht 63.5 in | Wt 151.4 lb

## 2014-01-25 DIAGNOSIS — Z3201 Encounter for pregnancy test, result positive: Secondary | ICD-10-CM

## 2014-01-25 DIAGNOSIS — N912 Amenorrhea, unspecified: Secondary | ICD-10-CM

## 2014-01-25 DIAGNOSIS — K219 Gastro-esophageal reflux disease without esophagitis: Secondary | ICD-10-CM

## 2014-01-25 LAB — POCT URINE PREGNANCY: Preg Test, Ur: POSITIVE

## 2014-01-25 MED ORDER — RANITIDINE HCL 150 MG PO CAPS: 150.0000 mg | ORAL_CAPSULE | Freq: Two times a day (BID) | ORAL | Status: DC

## 2014-01-29 ENCOUNTER — Encounter: Payer: Self-pay | Admitting: Nurse Practitioner

## 2014-01-29 DIAGNOSIS — Z3201 Encounter for pregnancy test, result positive: Secondary | ICD-10-CM | POA: Insufficient documentation

## 2014-01-29 NOTE — Progress Notes (Signed)
Subjective:  Presents for amenorrhea since having her Nexplanon removed on 1/13. Only one current sexual partner. No pelvic pain. No fever. No discharge. No bleeding or spotting. Main complaint is frequent significant reflux which is been going on for while. Also has had some material in her cryptic tonsils causing bad breath. Nonsmoker. Has already stopped all medications.  Objective:   BP 112/82  Ht 5' 3.5" (1.613 m)  Wt 151 lb 6.4 oz (68.675 kg)  BMI 26.40 kg/m2 NAD. Alert, oriented. Lungs clear. Heart regular rate rhythm. Urine hCG positive. Abdomen soft nondistended with minimal epigastric area tenderness. Yellowish sebaceous material removed from tonsils by curette with minimal difficulty.  Assessment: Problem List Items Addressed This Visit     Digestive   GERD (gastroesophageal reflux disease)   Relevant Medications      ranitidine (ZANTAC) capsule     Other   Pregnancy test positive - Primary    Other Visit Diagnoses   Amenorrhea        Relevant Orders       POCT urine pregnancy (Completed)       Plan: Reviewed early prenatal care. Start prenatal vitamins. Zantac as directed for reflux. Wean off of any caffeine. Patient has an appointment with obstetrician on 3/2. Call back sooner if any problems.

## 2014-02-08 ENCOUNTER — Ambulatory Visit: Payer: 59 | Admitting: Obstetrics & Gynecology

## 2014-02-13 ENCOUNTER — Other Ambulatory Visit: Payer: Self-pay | Admitting: Obstetrics and Gynecology

## 2014-02-13 DIAGNOSIS — O3680X0 Pregnancy with inconclusive fetal viability, not applicable or unspecified: Secondary | ICD-10-CM

## 2014-02-15 ENCOUNTER — Ambulatory Visit (INDEPENDENT_AMBULATORY_CARE_PROVIDER_SITE_OTHER): Payer: 59

## 2014-02-15 DIAGNOSIS — O3680X0 Pregnancy with inconclusive fetal viability, not applicable or unspecified: Secondary | ICD-10-CM

## 2014-02-15 NOTE — Progress Notes (Signed)
U/S-single IUP with +FCA noted, FHR-153 BPM, cx appears long and closed (3.4cm), bilateral adnexa wnl, CRL c/w 7+4wks EDD 09/30/2014

## 2014-02-27 ENCOUNTER — Ambulatory Visit (INDEPENDENT_AMBULATORY_CARE_PROVIDER_SITE_OTHER): Payer: 59 | Admitting: Women's Health

## 2014-02-27 ENCOUNTER — Encounter (INDEPENDENT_AMBULATORY_CARE_PROVIDER_SITE_OTHER): Payer: Self-pay

## 2014-02-27 ENCOUNTER — Encounter: Payer: Self-pay | Admitting: Women's Health

## 2014-02-27 VITALS — BP 128/52 | Wt 148.2 lb

## 2014-02-27 DIAGNOSIS — O99019 Anemia complicating pregnancy, unspecified trimester: Secondary | ICD-10-CM

## 2014-02-27 DIAGNOSIS — Z3400 Encounter for supervision of normal first pregnancy, unspecified trimester: Secondary | ICD-10-CM

## 2014-02-27 DIAGNOSIS — Z1389 Encounter for screening for other disorder: Secondary | ICD-10-CM

## 2014-02-27 DIAGNOSIS — Z331 Pregnant state, incidental: Secondary | ICD-10-CM

## 2014-02-27 LAB — CBC
HCT: 34.2 % — ABNORMAL LOW (ref 36.0–46.0)
HEMOGLOBIN: 11.4 g/dL — AB (ref 12.0–15.0)
MCH: 24.5 pg — ABNORMAL LOW (ref 26.0–34.0)
MCHC: 33.3 g/dL (ref 30.0–36.0)
MCV: 73.5 fL — ABNORMAL LOW (ref 78.0–100.0)
Platelets: 288 10*3/uL (ref 150–400)
RBC: 4.65 MIL/uL (ref 3.87–5.11)
RDW: 17.9 % — AB (ref 11.5–15.5)
WBC: 6.9 10*3/uL (ref 4.0–10.5)

## 2014-02-27 LAB — POCT URINALYSIS DIPSTICK
Glucose, UA: NEGATIVE
KETONES UA: NEGATIVE
Nitrite, UA: NEGATIVE
Protein, UA: 1
RBC UA: NEGATIVE

## 2014-02-27 LAB — TSH: TSH: 0.028 u[IU]/mL — ABNORMAL LOW (ref 0.350–4.500)

## 2014-02-27 NOTE — Progress Notes (Signed)
  Subjective:  Melissa Livingston is a 18 y.o. G44P0 African American female at [redacted]w[redacted]d by 7wk u/s,  being seen today for her first obstetrical visit.  Her obstetrical history is significant for primigravida.  Pregnancy history fully reviewed.  Patient reports no n/v in 1 week. . Denies vb, cramping, uti s/s, abnormal/malodorous vag d/c, or vulvovaginal itching/irritation.  BP 128/52  Wt 148 lb 3.2 oz (67.223 kg)  LMP 07/18/2013  HISTORY: OB History  Gravida Para Term Preterm AB SAB TAB Ectopic Multiple Living  1             # Outcome Date GA Lbr Len/2nd Weight Sex Delivery Anes PTL Lv  1 CUR              Past Medical History  Diagnosis Date  . Asthma    Past Surgical History  Procedure Laterality Date  . None    . No past surgeries    . Esophagogastroduodenoscopy (egd) with esophageal dilation N/A 07/19/2013    Procedure: ESOPHAGOGASTRODUODENOSCOPY (EGD) WITH ESOPHAGEAL DILATION;  Surgeon: Daneil Dolin, MD;  Location: [facility];  Service: Endoscopy;  Laterality: N/A;  2:45   Family History  Problem Relation Age of Onset  . Multiple sclerosis Mother   . Colon cancer Other     maternal great uncle, age greater than 75  . Heart disease Maternal Grandfather   . Heart disease Paternal Uncle   . Diabetes Paternal Uncle   . Hypertension Paternal Uncle     Exam   System:     General: Well developed & nourished, no acute distress   Skin: Warm & dry, normal coloration and turgor, no rashes   Neurologic: Alert & oriented, normal mood   Cardiovascular: Regular rate & rhythm   Respiratory: Effort & rate normal, LCTAB, acyanotic   Abdomen: Soft, non tender   Extremities: normal strength, tone   Pelvic Exam:    Perineum: Normal perineum   Vulva: Normal, no lesions   Vagina:  Normal mucosa, normal discharge   Cervix: Normal, bulbous, appears closed   Uterus: Normal size/shape/contour for GA   Thin prep pap smear n/a d/t age  FHR: 170 via informal transabdominal u/s   Assessment:    Pregnancy: G1P0 Patient Active Problem List   Diagnosis Date Noted  . Supervision of normal first pregnancy 02/27/2014    Priority: High  . Pregnancy test positive 01/29/2014  . Rectal bleeding 07/18/2013  . GERD (gastroesophageal reflux disease) 07/18/2013  . Esophageal dysphagia 07/18/2013  . Abdominal pain, epigastric 07/18/2013  . Gastritis 07/03/2013  . Irritable bowel syndrome 07/03/2013  . Depression 07/03/2013  . Asthma 03/27/2013  . FRACTURE, TOE 02/22/2008    [redacted]w[redacted]d G1P0 New OB visit GERD  Plan:  Initial labs drawn Continue prenatal vitamins Continue zantac Problem list reviewed and updated Reviewed n/v relief measures and warning s/s to report Reviewed recommended weight gain based on pre-gravid BMI Encouraged well-balanced diet Genetic Screening discussed Integrated Screen: requested Cystic fibrosis screening discussed requested Ultrasound discussed; fetal survey: requested Follow up in 3 weeks for 1st IT/NT and visit [place] completed [facility]FPartnership referral completed  Tawnya Crook CNM, [facility] 02/27/2014 11:31 AM

## 2014-02-27 NOTE — Patient Instructions (Addendum)
Nausea & Vomiting  Have saltine crackers or pretzels by your bed and eat a few bites before you raise your head out of bed in the morning  Eat small frequent meals throughout the day instead of large meals  Drink plenty of fluids throughout the day to stay hydrated, just don't drink a lot of fluids with your meals.  This can make your stomach fill up faster making you feel sick  Do not brush your teeth right after you eat  Products with real ginger are good for nausea, like ginger ale and ginger hard candy Make sure it says made with real ginger!  Sucking on sour candy like lemon heads is also good for nausea  If your prenatal vitamins make you nauseated, take them at night so you will sleep through the nausea  If you feel like you need medicine for the nausea & vomiting please let us know  If you are unable to keep any fluids or food down please let us know    Pregnancy - First Trimester During sexual intercourse, millions of sperm go into the vagina. Only 1 sperm will penetrate and fertilize the female egg while it is in the Fallopian tube. One week later, the fertilized egg implants into the wall of the uterus. An embryo begins to develop into a baby. At 6 to 8 weeks, the eyes and face are formed and the heartbeat can be seen on ultrasound. At the end of 12 weeks (first trimester), all the baby's organs are formed. Now that you are pregnant, you will want to do everything you can to have a healthy baby. Two of the most important things are to get good prenatal care and follow your caregiver's instructions. Prenatal care is all the medical care you receive before the baby's birth. It is given to prevent, find, and treat problems during the pregnancy and childbirth. PRENATAL EXAMS  During prenatal visits, your weight, blood pressure, and urine are checked. This is done to make sure you are healthy and progressing normally during the pregnancy.  A pregnant woman should gain 25 to 35 pounds  during the pregnancy. However, if you are overweight or underweight, your caregiver will advise you regarding your weight.  Your caregiver will ask and answer questions for you.  Blood work, cervical cultures, other necessary tests, and a Pap test are done during your prenatal exams. These tests are done to check on your health and the probable health of your baby. Tests are strongly recommended and done for HIV with your permission. This is the virus that causes AIDS. These tests are done because medicines can be given to help prevent your baby from being born with this infection should you have been infected without knowing it. Blood work is also used to find out your blood type, previous infections, and follow your blood levels (hemoglobin).  Low hemoglobin (anemia) is common during pregnancy. Iron and vitamins are given to help prevent this. Later in the pregnancy, blood tests for diabetes will be done along with any other tests if any problems develop.  You may need other tests to make sure you and the baby are doing well. CHANGES DURING THE FIRST TRIMESTER  Your body goes through many changes during pregnancy. They vary from person to person. Talk to your caregiver about changes you notice and are concerned about. Changes can include:  Your menstrual period stops.  The egg and sperm carry the genes that determine what you look like. Genes from you  and your partner are forming a baby. The female genes determine whether the baby is a boy or a girl.  Your body increases in girth and you may feel bloated.  Feeling sick to your stomach (nauseous) and throwing up (vomiting). If the vomiting is uncontrollable, call your caregiver.  Your breasts will begin to enlarge and become tender.  Your nipples may stick out more and become darker.  The need to urinate more. Painful urination may mean you have a bladder infection.  Tiring easily.  Loss of appetite.  Cravings for certain kinds of  food.  At first, you may gain or lose a couple of pounds.  You may have changes in your emotions from day to day (excited to be pregnant or concerned something may go wrong with the pregnancy and baby).  You may have more vivid and strange dreams. HOME CARE INSTRUCTIONS   It is very important to avoid all smoking, alcohol and non-prescribed drugs during your pregnancy. These affect the formation and growth of the baby. Avoid chemicals while pregnant to ensure the delivery of a healthy infant.  Start your prenatal visits by the 12th week of pregnancy. They are usually scheduled monthly at first, then more often in the last 2 months before delivery. Keep your caregiver's appointments. Follow your caregiver's instructions regarding medicine use, blood and lab tests, exercise, and diet.  During pregnancy, you are providing food for you and your baby. Eat regular, well-balanced meals. Choose foods such as meat, fish, milk and other low fat dairy products, vegetables, fruits, and whole-grain breads and cereals. Your caregiver will tell you of the ideal weight gain.  You can help morning sickness by keeping soda crackers at the bedside. Eat a couple before arising in the morning. You may want to use the crackers without salt on them.  Eating 4 to 5 small meals rather than 3 large meals a day also may help the nausea and vomiting.  Drinking liquids between meals instead of during meals also seems to help nausea and vomiting.  A physical sexual relationship may be continued throughout pregnancy if there are no other problems. Problems may be early (premature) leaking of amniotic fluid from the membranes, vaginal bleeding, or belly (abdominal) pain.  Exercise regularly if there are no restrictions. Check with your caregiver or physical therapist if you are unsure of the safety of some of your exercises. Greater weight gain will occur in the last 2 trimesters of pregnancy. Exercising will  help:  Control your weight.  Keep you in shape.  Prepare you for labor and delivery.  Help you lose your pregnancy weight after you deliver your baby.  Wear a good support or jogging bra for breast tenderness during pregnancy. This may help if worn during sleep too.  Ask when prenatal classes are available. Begin classes when they are offered.  Do not use hot tubs, steam rooms, or saunas.  Wear your seat belt when driving. This protects you and your baby if you are in an accident.  Avoid raw meat, uncooked cheese, cat litter boxes, and soil used by cats throughout the pregnancy. These carry germs that can cause birth defects in the baby.  The first trimester is a good time to visit your dentist for your dental health. Getting your teeth cleaned is okay. Use a softer toothbrush and brush gently during pregnancy.  Ask for help if you have financial, counseling, or nutritional needs during pregnancy. Your caregiver will be able to offer counseling for  these needs as well as refer you for other special needs.  Do not take any medicines or herbs unless told by your caregiver.  Inform your caregiver if there is any mental or physical domestic violence.  Make a list of emergency phone numbers of family, friends, hospital, and police and fire departments.  Write down your questions. Take them to your prenatal visit.  Do not douche.  Do not cross your legs.  If you have to stand for long periods of time, rotate you feet or take small steps in a circle.  You may have more vaginal secretions that may require a sanitary pad. Do not use tampons or scented sanitary pads. MEDICINES AND DRUG USE IN PREGNANCY  Take prenatal vitamins as directed. The vitamin should contain 1 milligram of folic acid. Keep all vitamins out of reach of children. Only a couple vitamins or tablets containing iron may be fatal to a baby or young child when ingested.  Avoid use of all medicines, including herbs,  over-the-counter medicines, not prescribed or suggested by your caregiver. Only take over-the-counter or prescription medicines for pain, discomfort, or fever as directed by your caregiver. Do not use aspirin, ibuprofen, or naproxen unless directed by your caregiver.  Let your caregiver also know about herbs you may be using.  Alcohol is related to a number of birth defects. This includes fetal alcohol syndrome. All alcohol, in any form, should be avoided completely. Smoking will cause low birth rate and premature babies.  Street or illegal drugs are very harmful to the baby. They are absolutely forbidden. A baby born to an addicted mother will be addicted at birth. The baby will go through the same withdrawal an adult does.  Let your caregiver know about any medicines that you have to take and for what reason you take them. SEEK MEDICAL CARE IF:  You have any concerns or worries during your pregnancy. It is better to call with your questions if you feel they cannot wait, rather than worry about them. SEEK IMMEDIATE MEDICAL CARE IF:   An unexplained oral temperature above 102 F (38.9 C) develops, or as your caregiver suggests.  You have leaking of fluid from the vagina (birth canal). If leaking membranes are suspected, take your temperature and inform your caregiver of this when you call.  There is vaginal spotting or bleeding. Notify your caregiver of the amount and how many pads are used.  You develop a bad smelling vaginal discharge with a change in the color.  You continue to feel sick to your stomach (nauseated) and have no relief from remedies suggested. You vomit blood or coffee ground-like materials.  You lose more than 2 pounds of weight in 1 week.  You gain more than 2 pounds of weight in 1 week and you notice swelling of your face, hands, feet, or legs.  You gain 5 pounds or more in 1 week (even if you do not have swelling of your hands, face, legs, or feet).  You get  exposed to [place] measles and have never had them.  You are exposed to fifth disease or chickenpox.  You develop belly (abdominal) pain. Round ligament discomfort is a common non-cancerous (benign) cause of abdominal pain in pregnancy. Your caregiver still must evaluate this.  You develop headache, fever, diarrhea, pain with urination, or shortness of breath.  You fall or are in a car accident or have any kind of trauma.  There is mental or physical violence in your home. Document  Released: 11/17/2001 Document Revised: 08/17/2012 Document Reviewed: 05/21/2009 [employer] Patient Information 2014 [employer].  

## 2014-02-28 ENCOUNTER — Encounter: Payer: Self-pay | Admitting: Women's Health

## 2014-02-28 DIAGNOSIS — Z2839 Other underimmunization status: Secondary | ICD-10-CM | POA: Insufficient documentation

## 2014-02-28 DIAGNOSIS — O09899 Supervision of other high risk pregnancies, unspecified trimester: Secondary | ICD-10-CM | POA: Insufficient documentation

## 2014-02-28 DIAGNOSIS — Z283 Underimmunization status: Secondary | ICD-10-CM

## 2014-02-28 LAB — URINALYSIS, ROUTINE W REFLEX MICROSCOPIC
Bilirubin Urine: NEGATIVE
GLUCOSE, UA: NEGATIVE mg/dL
Hgb urine dipstick: NEGATIVE
Nitrite: NEGATIVE
PH: 6.5 (ref 5.0–8.0)
Protein, ur: 30 mg/dL — AB
SPECIFIC GRAVITY, URINE: 1.027 (ref 1.005–1.030)
Urobilinogen, UA: 1 mg/dL (ref 0.0–1.0)

## 2014-02-28 LAB — DRUG SCREEN, URINE, NO CONFIRMATION
Amphetamine Screen, Ur: NEGATIVE
BENZODIAZEPINES.: NEGATIVE
Barbiturate Quant, Ur: NEGATIVE
Cocaine Metabolites: NEGATIVE
Creatinine,U: 421.1 mg/dL
Marijuana Metabolite: NEGATIVE
Methadone: NEGATIVE
Opiate Screen, Urine: NEGATIVE
PHENCYCLIDINE (PCP): NEGATIVE
PROPOXYPHENE: NEGATIVE

## 2014-02-28 LAB — SICKLE CELL SCREEN: Sickle Cell Screen: NEGATIVE

## 2014-02-28 LAB — RUBELLA SCREEN: RUBELLA: 1.57 {index} — AB (ref <0.90)

## 2014-02-28 LAB — ABO AND RH: RH TYPE: POSITIVE

## 2014-02-28 LAB — GC/CHLAMYDIA PROBE AMP
CT Probe RNA: NEGATIVE
GC Probe RNA: NEGATIVE

## 2014-02-28 LAB — HEPATITIS B SURFACE ANTIGEN: HEP B S AG: NEGATIVE

## 2014-02-28 LAB — URINE CULTURE: Colony Count: >=100000

## 2014-02-28 LAB — URINALYSIS, MICROSCOPIC ONLY
Casts: NONE SEEN
Crystals: NONE SEEN

## 2014-02-28 LAB — ANTIBODY SCREEN: Antibody Screen: NEGATIVE

## 2014-02-28 LAB — HIV ANTIBODY (ROUTINE TESTING W REFLEX): HIV: NONREACTIVE

## 2014-02-28 LAB — RPR

## 2014-02-28 LAB — OXYCODONE SCREEN, UA, RFLX CONFIRM: Oxycodone Screen, Ur: NEGATIVE ng/mL

## 2014-02-28 LAB — VARICELLA ZOSTER ANTIBODY, IGG: VARICELLA IGG: 106.4 {index} (ref <135.00)

## 2014-03-01 LAB — CYSTIC FIBROSIS DIAGNOSTIC STUDY

## 2014-03-05 ENCOUNTER — Encounter: Payer: Self-pay | Admitting: Women's Health

## 2014-03-20 ENCOUNTER — Ambulatory Visit (INDEPENDENT_AMBULATORY_CARE_PROVIDER_SITE_OTHER): Payer: 59

## 2014-03-20 ENCOUNTER — Other Ambulatory Visit: Payer: Self-pay | Admitting: Women's Health

## 2014-03-20 ENCOUNTER — Encounter: Payer: Self-pay | Admitting: Advanced Practice Midwife

## 2014-03-20 ENCOUNTER — Ambulatory Visit (INDEPENDENT_AMBULATORY_CARE_PROVIDER_SITE_OTHER): Payer: Medicaid Other | Admitting: Advanced Practice Midwife

## 2014-03-20 VITALS — BP 108/65 | Wt 146.0 lb

## 2014-03-20 DIAGNOSIS — O1211 Gestational proteinuria, first trimester: Secondary | ICD-10-CM

## 2014-03-20 DIAGNOSIS — R7989 Other specified abnormal findings of blood chemistry: Secondary | ICD-10-CM

## 2014-03-20 DIAGNOSIS — E0590 Thyrotoxicosis, unspecified without thyrotoxic crisis or storm: Secondary | ICD-10-CM

## 2014-03-20 DIAGNOSIS — O2390 Unspecified genitourinary tract infection in pregnancy, unspecified trimester: Secondary | ICD-10-CM

## 2014-03-20 DIAGNOSIS — Z1389 Encounter for screening for other disorder: Secondary | ICD-10-CM

## 2014-03-20 DIAGNOSIS — Z3400 Encounter for supervision of normal first pregnancy, unspecified trimester: Secondary | ICD-10-CM

## 2014-03-20 DIAGNOSIS — Z331 Pregnant state, incidental: Secondary | ICD-10-CM

## 2014-03-20 DIAGNOSIS — Z36 Encounter for antenatal screening of mother: Secondary | ICD-10-CM

## 2014-03-20 DIAGNOSIS — O99019 Anemia complicating pregnancy, unspecified trimester: Secondary | ICD-10-CM

## 2014-03-20 DIAGNOSIS — O99281 Endocrine, nutritional and metabolic diseases complicating pregnancy, first trimester: Secondary | ICD-10-CM

## 2014-03-20 LAB — POCT URINALYSIS DIPSTICK
GLUCOSE UA: NEGATIVE
KETONES UA: NEGATIVE
Nitrite, UA: NEGATIVE

## 2014-03-20 NOTE — Addendum Note (Signed)
Addended by: Traci Sermon A on: 03/20/2014 12:58 PM   Modules accepted: Orders

## 2014-03-20 NOTE — Progress Notes (Signed)
Had NT/IT today.  Thyroid panel drawn today d/t 0.028 TSH 3/24.  Has never had thyroid checked before now, asymptomatic except for heat intolerance.  Still nauseated.  Diclegis samples given.  Will rx if it works well.  Initial urine culture showed contamination.  Will reculture d/t proteinuria--consider 24 hour baseline if culture negative and protein persistent.  F/u 4 weeks for 2nd IT/Low-risk ob appt

## 2014-03-20 NOTE — Progress Notes (Signed)
U/S(12+2wks)-single active fetus, CRL c/w dates, NB present, NT-1.2mm, cx appears closed (3.6cm), bilateral adnexa appears WNL, FHr- 169 BPM, anterior Gr 0 placenta

## 2014-03-20 NOTE — Addendum Note (Signed)
Addended by: Traci Sermon A on: 03/20/2014 02:59 PM   Modules accepted: Orders

## 2014-03-21 ENCOUNTER — Other Ambulatory Visit: Payer: Self-pay | Admitting: Nurse Practitioner

## 2014-03-21 LAB — THYROID PANEL
FREE T4: 1.39 ng/dL (ref 0.80–1.80)
T3 UPTAKE: 19 % — AB (ref 22.5–37.0)
T4 TOTAL: 17.3 ug/dL — AB (ref 5.0–12.5)
TSH: 0.482 u[IU]/mL (ref 0.350–4.500)

## 2014-03-22 LAB — URINE CULTURE
COLONY COUNT: NO GROWTH
Organism ID, Bacteria: NO GROWTH

## 2014-03-27 LAB — MATERNAL SCREEN, INTEGRATED #1

## 2014-03-29 ENCOUNTER — Telehealth: Payer: Self-pay | Admitting: *Deleted

## 2014-03-29 NOTE — Telephone Encounter (Signed)
Spoke with pt's mom. Pt is having a nose bleed. I advised to hold pressure on bony part of nose until bleeding stops, may take up to 10 minutes for bleeding to stop. Advised can put ice pack on back of neck, and don't blow nose. Family voiced understanding. Reviewed with Manus Gunning. [place]

## 2014-04-13 ENCOUNTER — Telehealth: Payer: Self-pay | Admitting: *Deleted

## 2014-04-13 NOTE — Telephone Encounter (Signed)
Pt c/o lower abdominal pain, vaginal discharge with itching, no vaginal bleeding. When asked pt to rate pain on scale of 1-10 pt states "little pain." Pt informed to take tylenol, push fluids, rest, no providers in the office due to office closes at 2 pm on Friday. If pain persist to go to [facility]. Call was transferred to front staff for an appt to be made for Monday.

## 2014-04-16 ENCOUNTER — Encounter: Payer: Self-pay | Admitting: Adult Health

## 2014-04-16 ENCOUNTER — Ambulatory Visit (INDEPENDENT_AMBULATORY_CARE_PROVIDER_SITE_OTHER): Payer: Medicaid Other | Admitting: Adult Health

## 2014-04-16 VITALS — BP 110/70 | Wt 148.0 lb

## 2014-04-16 DIAGNOSIS — Z3400 Encounter for supervision of normal first pregnancy, unspecified trimester: Secondary | ICD-10-CM

## 2014-04-16 DIAGNOSIS — Z1389 Encounter for screening for other disorder: Secondary | ICD-10-CM

## 2014-04-16 DIAGNOSIS — O99019 Anemia complicating pregnancy, unspecified trimester: Secondary | ICD-10-CM

## 2014-04-16 DIAGNOSIS — Z331 Pregnant state, incidental: Secondary | ICD-10-CM

## 2014-04-16 LAB — POCT URINALYSIS DIPSTICK
Blood, UA: NEGATIVE
GLUCOSE UA: NEGATIVE
KETONES UA: NEGATIVE
Nitrite, UA: NEGATIVE
PROTEIN UA: 1

## 2014-04-16 NOTE — Patient Instructions (Signed)
Second Trimester of Pregnancy The second trimester is from week 13 through week 28, months 4 through 6. The second trimester is often a time when you feel your best. Your body has also adjusted to being pregnant, and you begin to feel better physically. Usually, morning sickness has lessened or quit completely, you may have more energy, and you may have an increase in appetite. The second trimester is also a time when the fetus is growing rapidly. At the end of the sixth month, the fetus is about 9 inches long and weighs about 1 pounds. You will likely begin to feel the baby move (quickening) between 18 and 20 weeks of the pregnancy. BODY CHANGES Your body goes through many changes during pregnancy. The changes vary from woman to woman.   Your weight will continue to increase. You will notice your lower abdomen bulging out.  You may begin to get stretch marks on your hips, abdomen, and breasts.  You may develop headaches that can be relieved by medicines approved by your caregiver.  You may urinate more often because the fetus is pressing on your bladder.  You may develop or continue to have heartburn as a result of your pregnancy.  You may develop constipation because certain hormones are causing the muscles that push waste through your intestines to slow down.  You may develop hemorrhoids or swollen, bulging veins (varicose veins).  You may have back pain because of the weight gain and pregnancy hormones relaxing your joints between the bones in your pelvis and as a result of a shift in weight and the muscles that support your balance.  Your breasts will continue to grow and be tender.  Your gums may bleed and may be sensitive to brushing and flossing.  Dark spots or blotches (chloasma, mask of pregnancy) may develop on your face. This will likely fade after the baby is born.  A dark line from your belly button to the pubic area (linea nigra) may appear. This will likely fade after the  baby is born. WHAT TO EXPECT AT YOUR PRENATAL VISITS During a routine prenatal visit:  You will be weighed to make sure you and the fetus are growing normally.  Your blood pressure will be taken.  Your abdomen will be measured to track your baby's growth.  The fetal heartbeat will be listened to.  Any test results from the previous visit will be discussed. Your caregiver may ask you:  How you are feeling.  If you are feeling the baby move.  If you have had any abnormal symptoms, such as leaking fluid, bleeding, severe headaches, or abdominal cramping.  If you have any questions. Other tests that may be performed during your second trimester include:  Blood tests that check for:  Low iron levels (anemia).  Gestational diabetes (between 24 and 28 weeks).  Rh antibodies.  Urine tests to check for infections, diabetes, or protein in the urine.  An ultrasound to confirm the proper growth and development of the baby.  An amniocentesis to check for possible genetic problems.  Fetal screens for spina bifida and Down syndrome. HOME CARE INSTRUCTIONS   Avoid all smoking, herbs, alcohol, and unprescribed drugs. These chemicals affect the formation and growth of the baby.  Follow your caregiver's instructions regarding medicine use. There are medicines that are either safe or unsafe to take during pregnancy.  Exercise only as directed by your caregiver. Experiencing uterine cramps is a good sign to stop exercising.  Continue to eat regular,   healthy meals.  Wear a good support bra for breast tenderness.  Do not use hot tubs, steam rooms, or saunas.  Wear your seat belt at all times when driving.  Avoid raw meat, uncooked cheese, cat litter boxes, and soil used by cats. These carry germs that can cause birth defects in the baby.  Take your prenatal vitamins.  Try taking a stool softener (if your caregiver approves) if you develop constipation. Eat more high-fiber foods,  such as fresh vegetables or fruit and whole grains. Drink plenty of fluids to keep your urine clear or pale yellow.  Take warm sitz baths to soothe any pain or discomfort caused by hemorrhoids. Use hemorrhoid cream if your caregiver approves.  If you develop varicose veins, wear support hose. Elevate your feet for 15 minutes, 3 4 times a day. Limit salt in your diet.  Avoid heavy lifting, wear low heel shoes, and practice good posture.  Rest with your legs elevated if you have leg cramps or low back pain.  Visit your dentist if you have not gone yet during your pregnancy. Use a soft toothbrush to brush your teeth and be gentle when you floss.  A sexual relationship may be continued unless your caregiver directs you otherwise.  Continue to go to all your prenatal visits as directed by your caregiver. SEEK MEDICAL CARE IF:   You have dizziness.  You have mild pelvic cramps, pelvic pressure, or nagging pain in the abdominal area.  You have persistent nausea, vomiting, or diarrhea.  You have a bad smelling vaginal discharge.  You have pain with urination. SEEK IMMEDIATE MEDICAL CARE IF:   You have a fever.  You are leaking fluid from your vagina.  You have spotting or bleeding from your vagina.  You have severe abdominal cramping or pain.  You have rapid weight gain or loss.  You have shortness of breath with chest pain.  You notice sudden or extreme swelling of your face, hands, ankles, feet, or legs.  You have not felt your baby move in over an hour.  You have severe headaches that do not go away with medicine.  You have vision changes. Document Released: 11/17/2001 Document Revised: 07/26/2013 Document Reviewed: 01/24/2013 [facility] Patient Information 2014 [place]. Follow up in 4 weeks for [place]

## 2014-04-16 NOTE — Progress Notes (Signed)
Pt denies any problems or concerns at this time.  

## 2014-04-16 NOTE — Progress Notes (Signed)
No bleeding has ?round ligament pain Had FHR 158, eat often and push fluids, will get second IT today and return in 4 weeks for [place]

## 2014-04-17 ENCOUNTER — Encounter: Payer: 59 | Admitting: Advanced Practice Midwife

## 2014-04-21 LAB — MATERNAL SCREEN, INTEGRATED #2
AFP MoM: 0.97
AFP, Serum: 38 ng/mL
Calculated Gestational Age: 16.3
Crown Rump Length: 61 mm
ESTRIOL MOM MAT SCREEN: 1.04
Estriol, Free: 0.92 ng/mL
INHIBIN A DIMERIC MAT SCREEN: 152 pg/mL
Inhibin A MoM: 0.91
NT MoM: 1.04
NUMBER OF FETUSES MAT SCREEN 2: 1
Nuchal Translucency: 1.45 mm
PAPP-A MAT SCREEN: 1352 ng/mL
PAPP-A MOM MAT SCREEN: 1.05
Rish for ONTD: <1:5000 {titer}
hCG MoM: 0.87
hCG, Serum: 35 IU/mL

## 2014-04-23 ENCOUNTER — Encounter: Payer: Self-pay | Admitting: Adult Health

## 2014-05-03 ENCOUNTER — Telehealth: Payer: Self-pay | Admitting: Obstetrics and Gynecology

## 2014-05-03 NOTE — Telephone Encounter (Signed)
Spoke with pt. Pt woke up with nausea and diarrhea. No fever. I advised Immodium, follow instructions on bottle, and drink plenty of fluids. Advised to take it easy until this passes. If Immodium don't help, or if she gets worse, call us back. Pt voiced understanding. [place]

## 2014-05-14 ENCOUNTER — Ambulatory Visit (INDEPENDENT_AMBULATORY_CARE_PROVIDER_SITE_OTHER): Payer: 59

## 2014-05-14 ENCOUNTER — Encounter: Payer: Self-pay | Admitting: Women's Health

## 2014-05-14 ENCOUNTER — Ambulatory Visit (INDEPENDENT_AMBULATORY_CARE_PROVIDER_SITE_OTHER): Payer: Medicaid Other | Admitting: Women's Health

## 2014-05-14 VITALS — BP 120/80 | Wt 156.0 lb

## 2014-05-14 DIAGNOSIS — Z3400 Encounter for supervision of normal first pregnancy, unspecified trimester: Secondary | ICD-10-CM

## 2014-05-14 DIAGNOSIS — Z1389 Encounter for screening for other disorder: Secondary | ICD-10-CM

## 2014-05-14 DIAGNOSIS — Z331 Pregnant state, incidental: Secondary | ICD-10-CM

## 2014-05-14 LAB — POCT URINALYSIS DIPSTICK
Blood, UA: NEGATIVE
Glucose, UA: NEGATIVE
Ketones, UA: NEGATIVE
Nitrite, UA: NEGATIVE

## 2014-05-14 NOTE — Patient Instructions (Addendum)
Second Trimester of Pregnancy The second trimester is from week 13 through week 28, months 4 through 6. The second trimester is often a time when you feel your best. Your body has also adjusted to being pregnant, and you begin to feel better physically. Usually, morning sickness has lessened or quit completely, you may have more energy, and you may have an increase in appetite. The second trimester is also a time when the fetus is growing rapidly. At the end of the sixth month, the fetus is about 9 inches long and weighs about 1 pounds. You will likely begin to feel the baby move (quickening) between 18 and 20 weeks of the pregnancy. BODY CHANGES Your body goes through many changes during pregnancy. The changes vary from woman to woman.   Your weight will continue to increase. You will notice your lower abdomen bulging out.  You may begin to get stretch marks on your hips, abdomen, and breasts.  You may develop headaches that can be relieved by medicines approved by your caregiver.  You may urinate more often because the fetus is pressing on your bladder.  You may develop or continue to have heartburn as a result of your pregnancy.  You may develop constipation because certain hormones are causing the muscles that push waste through your intestines to slow down.  You may develop hemorrhoids or swollen, bulging veins (varicose veins).  You may have back pain because of the weight gain and pregnancy hormones relaxing your joints between the bones in your pelvis and as a result of a shift in weight and the muscles that support your balance.  Your breasts will continue to grow and be tender.  Your gums may bleed and may be sensitive to brushing and flossing.  Dark spots or blotches (chloasma, mask of pregnancy) may develop on your face. This will likely fade after the baby is born.  A dark line from your belly button to the pubic area (linea nigra) may appear. This will likely fade after the  baby is born. WHAT TO EXPECT AT YOUR PRENATAL VISITS During a routine prenatal visit:  You will be weighed to make sure you and the fetus are growing normally.  Your blood pressure will be taken.  Your abdomen will be measured to track your baby's growth.  The fetal heartbeat will be listened to.  Any test results from the previous visit will be discussed. Your caregiver may ask you:  How you are feeling.  If you are feeling the baby move.  If you have had any abnormal symptoms, such as leaking fluid, bleeding, severe headaches, or abdominal cramping.  If you have any questions. Other tests that may be performed during your second trimester include:  Blood tests that check for:  Low iron levels (anemia).  Gestational diabetes (between 24 and 28 weeks).  Rh antibodies.  Urine tests to check for infections, diabetes, or protein in the urine.  An ultrasound to confirm the proper growth and development of the baby.  An amniocentesis to check for possible genetic problems.  Fetal screens for spina bifida and Down syndrome. HOME CARE INSTRUCTIONS   Avoid all smoking, herbs, alcohol, and unprescribed drugs. These chemicals affect the formation and growth of the baby.  Follow your caregiver's instructions regarding medicine use. There are medicines that are either safe or unsafe to take during pregnancy.  Exercise only as directed by your caregiver. Experiencing uterine cramps is a good sign to stop exercising.  Continue to eat regular,   healthy meals.  Wear a good support bra for breast tenderness.  Do not use hot tubs, steam rooms, or saunas.  Wear your seat belt at all times when driving.  Avoid raw meat, uncooked cheese, cat litter boxes, and soil used by cats. These carry germs that can cause birth defects in the baby.  Take your prenatal vitamins.  Try taking a stool softener (if your caregiver approves) if you develop constipation. Eat more high-fiber foods,  such as fresh vegetables or fruit and whole grains. Drink plenty of fluids to keep your urine clear or pale yellow.  Take warm sitz baths to soothe any pain or discomfort caused by hemorrhoids. Use hemorrhoid cream if your caregiver approves.  If you develop varicose veins, wear support hose. Elevate your feet for 15 minutes, 3 4 times a day. Limit salt in your diet.  Avoid heavy lifting, wear low heel shoes, and practice good posture.  Rest with your legs elevated if you have leg cramps or low back pain.  Visit your dentist if you have not gone yet during your pregnancy. Use a soft toothbrush to brush your teeth and be gentle when you floss.  A sexual relationship may be continued unless your caregiver directs you otherwise.  Continue to go to all your prenatal visits as directed by your caregiver. SEEK MEDICAL CARE IF:   You have dizziness.  You have mild pelvic cramps, pelvic pressure, or nagging pain in the abdominal area.  You have persistent nausea, vomiting, or diarrhea.  You have a bad smelling vaginal discharge.  You have pain with urination. SEEK IMMEDIATE MEDICAL CARE IF:   You have a fever.  You are leaking fluid from your vagina.  You have spotting or bleeding from your vagina.  You have severe abdominal cramping or pain.  You have rapid weight gain or loss.  You have shortness of breath with chest pain.  You notice sudden or extreme swelling of your face, hands, ankles, feet, or legs.  You have not felt your baby move in over an hour.  You have severe headaches that do not go away with medicine.  You have vision changes. Document Released: 11/17/2001 Document Revised: 07/26/2013 Document Reviewed: 01/24/2013 [facility] Patient Information 2014 [place].  Nosebleed Nosebleeds can be caused by many conditions including trauma, infections, polyps, foreign bodies, dry mucous membranes or climate, medications and air conditioning. Most nosebleeds occur  in the front of the nose. It is because of this location that most nosebleeds can be controlled by pinching the nostrils gently and continuously. Do this for at least 10 to 20 minutes. The reason for this long continuous pressure is that you must hold it long enough for the blood to clot. If during that 10 to 20 minute time period, pressure is released, the process may have to be started again. The nosebleed may stop by itself, quit with pressure, need concentrated heating (cautery) or stop with pressure from packing. HOME CARE INSTRUCTIONS   If your nose was packed, try to maintain the pack inside until your caregiver removes it. If a gauze pack was used and it starts to fall out, gently replace or cut the end off. Do not cut if a balloon catheter was used to pack the nose. Otherwise, do not remove unless instructed.  Avoid blowing your nose for 12 hours after treatment. This could dislodge the pack or clot and start bleeding again.  If the bleeding starts again, sit up and bending forward, gently pinch  the front half of your nose continuously for 20 minutes.  If bleeding was caused by dry mucous membranes, cover the inside of your nose every morning with a petroleum or antibiotic ointment. Use your little fingertip as an applicator. Do this as needed during dry weather. This will keep the mucous membranes moist and allow them to heal.  Maintain humidity in your home by using less air conditioning or using a humidifier.  Do not use aspirin or medications which make bleeding more likely. Your caregiver can give you recommendations on this.  Resume normal activities as able but try to avoid straining, lifting or bending at the waist for several days.  If the nosebleeds become recurrent and the cause is unknown, your caregiver may suggest laboratory tests. SEEK IMMEDIATE MEDICAL CARE IF:   Bleeding recurs and cannot be controlled.  There is unusual bleeding from or bruising on other parts of the  body.  You have a fever.  Nosebleeds continue.  There is any worsening of the condition which originally brought you in.  You become lightheaded, feel faint, become sweaty or vomit blood. MAKE SURE YOU:   Understand these instructions.  Will watch your condition.  Will get help right away if you are not doing well or get worse. Document Released: 09/02/2005 Document Revised: 02/15/2012 Document Reviewed: 10/25/2009 [facility] Patient Information 2014 [place], [place].

## 2014-05-14 NOTE — Progress Notes (Signed)
U/S(20+1wks)-single active fetus, meas c/w dates, fluid wnl, anterior Gr 0 placenta, cx appears closed (3.4cm) bilateral adnexa appears wnl, no major abnl noted although unable to view cardiac OFT's female fetus would like to reck cardiac anatomy , FHR-156 bpm

## 2014-05-14 NOTE — Progress Notes (Signed)
Low-risk OB appointment G1P0 [redacted]w[redacted]d Estimated Date of Delivery: 09/30/14 Blood pressure 120/80, weight 156 lb (70.761 kg), last menstrual period 07/18/2013.  BP, weight, and urine results all reviewed and noted.  Please refer to the obstetrical flow sheet for the fundal height and fetal heart rate documentation. Reports good fm.  Denies regular uc's, lof, vb, or uti s/s. Nose bleeds 2x/daily x ~3wks- some sinus congestion.  Nares look inflammed/irritated, +frontal sinus pressure.  Reviewed today's anatomy u/s female-recheck cardiac oft's in 8wks, ptl s/s, fm. Plan:  Continued routine obstetrical care, to try claritin/zyrtec for sinuses, and saline nasal spray F/U in 4 weeks for OB appointment

## 2014-06-11 ENCOUNTER — Ambulatory Visit (INDEPENDENT_AMBULATORY_CARE_PROVIDER_SITE_OTHER): Payer: Medicaid Other | Admitting: Obstetrics & Gynecology

## 2014-06-11 ENCOUNTER — Encounter: Payer: Self-pay | Admitting: Obstetrics & Gynecology

## 2014-06-11 VITALS — BP 128/70 | Wt 162.4 lb

## 2014-06-11 DIAGNOSIS — Z3402 Encounter for supervision of normal first pregnancy, second trimester: Secondary | ICD-10-CM

## 2014-06-11 DIAGNOSIS — Z3400 Encounter for supervision of normal first pregnancy, unspecified trimester: Secondary | ICD-10-CM

## 2014-06-11 DIAGNOSIS — Z1389 Encounter for screening for other disorder: Secondary | ICD-10-CM

## 2014-06-11 DIAGNOSIS — Z331 Pregnant state, incidental: Secondary | ICD-10-CM

## 2014-06-11 LAB — POCT URINALYSIS DIPSTICK
Blood, UA: NEGATIVE
GLUCOSE UA: NEGATIVE
KETONES UA: NEGATIVE
NITRITE UA: NEGATIVE
Protein, UA: 1

## 2014-06-11 NOTE — Progress Notes (Signed)
G1P0 [redacted]w[redacted]d Estimated Date of Delivery: 09/30/14  Blood pressure 128/70, weight 162 lb 6.4 oz (73.664 kg), last menstrual period 07/18/2013.   BP weight and urine results all reviewed and noted.  Please refer to the obstetrical flow sheet for the fundal height and fetal heart rate documentation:  Patient reports good fetal movement, denies any bleeding and no rupture of membranes symptoms or regular contractions. Patient is without complaints. All questions were answered.  Plan:  Continued routine obstetrical care,   Follow up in 4 weeks for OB appointment, PN2

## 2014-07-09 ENCOUNTER — Encounter: Payer: Self-pay | Admitting: Women's Health

## 2014-07-09 ENCOUNTER — Ambulatory Visit (INDEPENDENT_AMBULATORY_CARE_PROVIDER_SITE_OTHER): Payer: Medicaid Other | Admitting: Women's Health

## 2014-07-09 ENCOUNTER — Ambulatory Visit (INDEPENDENT_AMBULATORY_CARE_PROVIDER_SITE_OTHER): Payer: Medicaid Other

## 2014-07-09 ENCOUNTER — Other Ambulatory Visit: Payer: Medicaid Other

## 2014-07-09 VITALS — BP 132/68 | Wt 172.0 lb

## 2014-07-09 DIAGNOSIS — Z331 Pregnant state, incidental: Secondary | ICD-10-CM

## 2014-07-09 DIAGNOSIS — O09899 Supervision of other high risk pregnancies, unspecified trimester: Secondary | ICD-10-CM

## 2014-07-09 DIAGNOSIS — Z3403 Encounter for supervision of normal first pregnancy, third trimester: Secondary | ICD-10-CM

## 2014-07-09 DIAGNOSIS — Z3400 Encounter for supervision of normal first pregnancy, unspecified trimester: Secondary | ICD-10-CM

## 2014-07-09 DIAGNOSIS — O358XX0 Maternal care for other (suspected) fetal abnormality and damage, not applicable or unspecified: Secondary | ICD-10-CM

## 2014-07-09 DIAGNOSIS — Z1389 Encounter for screening for other disorder: Secondary | ICD-10-CM

## 2014-07-09 DIAGNOSIS — Z283 Underimmunization status: Principal | ICD-10-CM

## 2014-07-09 DIAGNOSIS — Z3402 Encounter for supervision of normal first pregnancy, second trimester: Secondary | ICD-10-CM

## 2014-07-09 LAB — POCT URINALYSIS DIPSTICK
GLUCOSE UA: NEGATIVE
Ketones, UA: NEGATIVE
NITRITE UA: NEGATIVE
RBC UA: NEGATIVE

## 2014-07-09 NOTE — Patient Instructions (Signed)
Due Date: 09/30/14, 28wks1day on 07/09/14  Tdap vaccine at 28 weeks at health department or your family doctor, recommended for you and anyone who will be around the baby a lot   Call the office [phone]) or go to [facility] if:  You begin to have strong, frequent contractions  Your water breaks.  Sometimes it is a big gush of fluid, sometimes it is just a trickle that keeps getting your panties wet or running down your legs  You have vaginal bleeding.  It is normal to have a small amount of spotting if your cervix was checked.   You don't feel your baby moving like normal.  If you don't, get you something to eat and drink and lay down and focus on feeling your baby move.  You should feel at least 10 movements in 2 hours.  If you don't, you should call the office or go to [place] of Pregnancy The third trimester is from week 29 through week 42, months 7 through 9. The third trimester is a time when the fetus is growing rapidly. At the end of the ninth month, the fetus is about 20 inches in length and weighs 6-10 pounds.  BODY CHANGES Your body goes through many changes during pregnancy. The changes vary from woman to woman.   Your weight will continue to increase. You can expect to gain 25-35 pounds (11-16 kg) by the end of the pregnancy.  You may begin to get stretch marks on your hips, abdomen, and breasts.  You may urinate more often because the fetus is moving lower into your pelvis and pressing on your bladder.  You may develop or continue to have heartburn as a result of your pregnancy.  You may develop constipation because certain hormones are causing the muscles that push waste through your intestines to slow down.  You may develop hemorrhoids or swollen, bulging veins (varicose veins).  You may have pelvic pain because of the weight gain and pregnancy hormones relaxing your joints between the bones in your pelvis. Backaches may result from  overexertion of the muscles supporting your posture.  You may have changes in your hair. These can include thickening of your hair, rapid growth, and changes in texture. Some women also have hair loss during or after pregnancy, or hair that feels dry or thin. Your hair will most likely return to normal after your baby is born.  Your breasts will continue to grow and be tender. A yellow discharge may leak from your breasts called colostrum.  Your belly button may stick out.  You may feel short of breath because of your expanding uterus.  You may notice the fetus "dropping," or moving lower in your abdomen.  You may have a bloody mucus discharge. This usually occurs a few days to a week before labor begins.  Your cervix becomes thin and soft (effaced) near your due date. WHAT TO EXPECT AT YOUR PRENATAL EXAMS  You will have prenatal exams every 2 weeks until week 36. Then, you will have weekly prenatal exams. During a routine prenatal visit:  You will be weighed to make sure you and the fetus are growing normally.  Your blood pressure is taken.  Your abdomen will be measured to track your baby's growth.  The fetal heartbeat will be listened to.  Any test results from the previous visit will be discussed.  You may have a cervical check near your due date to see if you  have effaced. At around 36 weeks, your caregiver will check your cervix. At the same time, your caregiver will also perform a test on the secretions of the vaginal tissue. This test is to determine if a type of bacteria, Group B streptococcus, is present. Your caregiver will explain this further. Your caregiver may ask you:  What your birth plan is.  How you are feeling.  If you are feeling the baby move.  If you have had any abnormal symptoms, such as leaking fluid, bleeding, severe headaches, or abdominal cramping.  If you have any questions. Other tests or screenings that may be performed during your third  trimester include:  Blood tests that check for low iron levels (anemia).  Fetal testing to check the health, activity level, and growth of the fetus. Testing is done if you have certain medical conditions or if there are problems during the pregnancy. FALSE LABOR You may feel small, irregular contractions that eventually go away. These are called Braxton Hicks contractions, or false labor. Contractions may last for hours, days, or even weeks before true labor sets in. If contractions come at regular intervals, intensify, or become painful, it is best to be seen by your caregiver.  SIGNS OF LABOR   Menstrual-like cramps.  Contractions that are 5 minutes apart or less.  Contractions that start on the top of the uterus and spread down to the lower abdomen and back.  A sense of increased pelvic pressure or back pain.  A watery or bloody mucus discharge that comes from the vagina. If you have any of these signs before the 37th week of pregnancy, call your caregiver right away. You need to go to the hospital to get checked immediately. HOME CARE INSTRUCTIONS   Avoid all smoking, herbs, alcohol, and unprescribed drugs. These chemicals affect the formation and growth of the baby.  Follow your caregiver's instructions regarding medicine use. There are medicines that are either safe or unsafe to take during pregnancy.  Exercise only as directed by your caregiver. Experiencing uterine cramps is a good sign to stop exercising.  Continue to eat regular, healthy meals.  Wear a good support bra for breast tenderness.  Do not use hot tubs, steam rooms, or saunas.  Wear your seat belt at all times when driving.  Avoid raw meat, uncooked cheese, cat litter boxes, and soil used by cats. These carry germs that can cause birth defects in the baby.  Take your prenatal vitamins.  Try taking a stool softener (if your caregiver approves) if you develop constipation. Eat more high-fiber foods, such as  fresh vegetables or fruit and whole grains. Drink plenty of fluids to keep your urine clear or pale yellow.  Take warm sitz baths to soothe any pain or discomfort caused by hemorrhoids. Use hemorrhoid cream if your caregiver approves.  If you develop varicose veins, wear support hose. Elevate your feet for 15 minutes, 3-4 times a day. Limit salt in your diet.  Avoid heavy lifting, wear low heal shoes, and practice good posture.  Rest a lot with your legs elevated if you have leg cramps or low back pain.  Visit your dentist if you have not gone during your pregnancy. Use a soft toothbrush to brush your teeth and be gentle when you floss.  A sexual relationship may be continued unless your caregiver directs you otherwise.  Do not travel far distances unless it is absolutely necessary and only with the approval of your caregiver.  Take prenatal classes to  understand, practice, and ask questions about the labor and delivery.  Make a trial run to the hospital.  Pack your hospital bag.  Prepare the baby's nursery.  Continue to go to all your prenatal visits as directed by your caregiver. SEEK MEDICAL CARE IF:  You are unsure if you are in labor or if your water has broken.  You have dizziness.  You have mild pelvic cramps, pelvic pressure, or nagging pain in your abdominal area.  You have persistent nausea, vomiting, or diarrhea.  You have a bad smelling vaginal discharge.  You have pain with urination. SEEK IMMEDIATE MEDICAL CARE IF:   You have a fever.  You are leaking fluid from your vagina.  You have spotting or bleeding from your vagina.  You have severe abdominal cramping or pain.  You have rapid weight loss or gain.  You have shortness of breath with chest pain.  You notice sudden or extreme swelling of your face, hands, ankles, feet, or legs.  You have not felt your baby move in over an hour.  You have severe headaches that do not go away with  medicine.  You have vision changes. Document Released: 11/17/2001 Document Revised: 11/28/2013 Document Reviewed: 01/24/2013 [facility] Patient Information 2015 [place], [place]. This information is not intended to replace advice given to you by your health care provider. Make sure you discuss any questions you have with your health care provider.

## 2014-07-09 NOTE — Progress Notes (Signed)
Low-risk OB appointment G1P0 [redacted]w[redacted]d Estimated Date of Delivery: 09/30/14 BP 132/68  Wt 172 lb (78.019 kg)  LMP 07/18/2013  BP, weight, and urine reviewed.  Refer to obstetrical flow sheet for FH & FHR.  Reports good fm.  Denies regular uc's, lof, vb, or uti s/s. No complaints. Didn't get scheduled for recheck of cardiac OFTs today, talked w/ u/s tech, who will fit her in Still w/ persistent proteinuria- had neg urine cx, will get 24hr urine today for baseline protein Reviewed ptl s/s, fkc. Recommended Tdap at HD/PCP per CDC guidelines. Recommended signing up for cb classes asap.  Plan:  Continue routine obstetrical care  F/U in 4wks for OB appointment

## 2014-07-10 ENCOUNTER — Telehealth: Payer: Self-pay | Admitting: *Deleted

## 2014-07-10 ENCOUNTER — Encounter: Payer: Self-pay | Admitting: Women's Health

## 2014-07-10 ENCOUNTER — Other Ambulatory Visit: Payer: Self-pay | Admitting: Women's Health

## 2014-07-10 DIAGNOSIS — O99013 Anemia complicating pregnancy, third trimester: Secondary | ICD-10-CM | POA: Insufficient documentation

## 2014-07-10 LAB — CBC
HEMATOCRIT: 28.5 % — AB (ref 36.0–46.0)
Hemoglobin: 8.7 g/dL — ABNORMAL LOW (ref 12.0–15.0)
MCH: 21 pg — ABNORMAL LOW (ref 26.0–34.0)
MCHC: 30.5 g/dL (ref 30.0–36.0)
MCV: 68.7 fL — ABNORMAL LOW (ref 78.0–100.0)
Platelets: 292 10*3/uL (ref 150–400)
RBC: 4.15 MIL/uL (ref 3.87–5.11)
RDW: 16 % — ABNORMAL HIGH (ref 11.5–15.5)
WBC: 8.8 10*3/uL (ref 4.0–10.5)

## 2014-07-10 LAB — GLUCOSE TOLERANCE, 2 HOURS W/ 1HR
GLUCOSE, 2 HOUR: 79 mg/dL (ref 70–139)
GLUCOSE, FASTING: 74 mg/dL (ref 70–99)
Glucose, 1 hour: 112 mg/dL (ref 70–170)

## 2014-07-10 LAB — HIV ANTIBODY (ROUTINE TESTING W REFLEX): HIV 1&2 Ab, 4th Generation: NONREACTIVE

## 2014-07-10 LAB — RPR

## 2014-07-10 LAB — ANTIBODY SCREEN: ANTIBODY SCREEN: NEGATIVE

## 2014-07-10 MED ORDER — FUSION PLUS PO CAPS: 1.0000 | ORAL_CAPSULE | ORAL | Status: DC

## 2014-07-10 NOTE — Telephone Encounter (Signed)
Pt informed of anemia and RX sent to pharmacy, was instructed to continue pnv as well as iron and increase iron rich foods.

## 2014-07-10 NOTE — Telephone Encounter (Signed)
Pt informed of Kim's instructions and voiced understanding.

## 2014-07-11 ENCOUNTER — Telehealth: Payer: Self-pay | Admitting: Women's Health

## 2014-07-11 ENCOUNTER — Ambulatory Visit: Payer: Medicaid Other

## 2014-07-11 LAB — HSV 2 ANTIBODY, IGG: HSV 2 Glycoprotein G Ab, IgG: <0.1 IV

## 2014-07-11 NOTE — Telephone Encounter (Signed)
Pts mother informed that letter for the health department is ready to be picked up.

## 2014-07-16 ENCOUNTER — Encounter: Payer: Self-pay | Admitting: Women's Health

## 2014-08-06 ENCOUNTER — Ambulatory Visit (INDEPENDENT_AMBULATORY_CARE_PROVIDER_SITE_OTHER): Payer: Medicaid Other | Admitting: Women's Health

## 2014-08-06 ENCOUNTER — Encounter: Payer: Self-pay | Admitting: Women's Health

## 2014-08-06 VITALS — BP 122/64 | Wt 179.0 lb

## 2014-08-06 DIAGNOSIS — Z3400 Encounter for supervision of normal first pregnancy, unspecified trimester: Secondary | ICD-10-CM

## 2014-08-06 DIAGNOSIS — O99019 Anemia complicating pregnancy, unspecified trimester: Secondary | ICD-10-CM

## 2014-08-06 DIAGNOSIS — Z3403 Encounter for supervision of normal first pregnancy, third trimester: Secondary | ICD-10-CM

## 2014-08-06 DIAGNOSIS — O99013 Anemia complicating pregnancy, third trimester: Secondary | ICD-10-CM

## 2014-08-06 DIAGNOSIS — Z1389 Encounter for screening for other disorder: Secondary | ICD-10-CM

## 2014-08-06 DIAGNOSIS — Z331 Pregnant state, incidental: Secondary | ICD-10-CM

## 2014-08-06 LAB — POCT URINALYSIS DIPSTICK
Glucose, UA: NEGATIVE
Ketones, UA: NEGATIVE
Nitrite, UA: NEGATIVE
PROTEIN UA: NEGATIVE
RBC UA: NEGATIVE

## 2014-08-06 LAB — POCT HEMOGLOBIN: Hemoglobin: 9.3 g/dL — AB (ref 12.2–16.2)

## 2014-08-06 NOTE — Patient Instructions (Signed)
Call the office ([phone]) or go to [facility] if:  You begin to have strong, frequent contractions  Your water breaks.  Sometimes it is a big gush of fluid, sometimes it is just a trickle that keeps getting your panties wet or running down your legs  You have vaginal bleeding.  It is normal to have a small amount of spotting if your cervix was checked.   You don't feel your baby moving like normal.  If you don't, get you something to eat and drink and lay down and focus on feeling your baby move.  You should feel at least 10 movements in 2 hours.  If you don't, you should call the office or go to [facility].    Preterm Labor Information Preterm labor is when labor starts at less than 37 weeks of pregnancy. The normal length of a pregnancy is 39 to 41 weeks. CAUSES Often, there is no identifiable underlying cause as to why a woman goes into preterm labor. One of the most common known causes of preterm labor is infection. Infections of the uterus, cervix, vagina, amniotic sac, bladder, kidney, or even the lungs (pneumonia) can cause labor to start. Other suspected causes of preterm labor include:   Urogenital infections, such as yeast infections and bacterial vaginosis.   Uterine abnormalities (uterine shape, uterine septum, fibroids, or bleeding from the placenta).   A cervix that has been operated on (it may fail to stay closed).   Malformations in the fetus.   Multiple gestations (twins, triplets, and so on).   Breakage of the amniotic sac.  RISK FACTORS  Having a previous history of preterm labor.   Having premature rupture of membranes (PROM).   Having a placenta that covers the opening of the cervix (placenta previa).   Having a placenta that separates from the uterus (placental abruption).   Having a cervix that is too weak to hold the fetus in the uterus (incompetent cervix).   Having too much fluid in the amniotic sac (polyhydramnios).   Taking  illegal drugs or smoking while pregnant.   Not gaining enough weight while pregnant.   Being younger than 18 and older than 20 years old.   Having a low socioeconomic status.   Being African American. SYMPTOMS Signs and symptoms of preterm labor include:   Menstrual-like cramps, abdominal pain, or back pain.  Uterine contractions that are regular, as frequent as six in an hour, regardless of their intensity (may be mild or painful).  Contractions that start on the top of the uterus and spread down to the lower abdomen and back.   A sense of increased pelvic pressure.   A watery or bloody mucus discharge that comes from the vagina.  TREATMENT Depending on the length of the pregnancy and other circumstances, your health care provider may suggest bed rest. If necessary, there are medicines that can be given to stop contractions and to mature the fetal lungs. If labor happens before 34 weeks of pregnancy, a prolonged hospital stay may be recommended. Treatment depends on the condition of both you and the fetus.  WHAT SHOULD YOU DO IF YOU THINK YOU ARE IN PRETERM LABOR? Call your health care provider right away. You will need to go to the hospital to get checked immediately. HOW CAN YOU PREVENT PRETERM LABOR IN FUTURE PREGNANCIES? You should:   Stop smoking if you smoke.  Maintain healthy weight gain and avoid chemicals and drugs that are not necessary.  Be watchful for   any type of infection.  Inform your health care provider if you have a known history of preterm labor. Document Released: 02/13/2004 Document Revised: 07/26/2013 Document Reviewed: 12/26/2012 [employer] Patient Information 2015 [employer]. This information is not intended to replace advice given to you by your health care provider. Make sure you discuss any questions you have with your health care provider.  

## 2014-08-06 NOTE — Progress Notes (Signed)
Low-risk OB appointment G1P0 [redacted]w[redacted]d Estimated Date of Delivery: 09/30/14 BP 122/64  Wt 179 lb (81.194 kg)  LMP 07/18/2013  BP, weight, and urine reviewed.  Refer to obstetrical flow sheet for FH & FHR.  Reports good fm.  Denies regular uc's, lof, vb, or uti s/s. No complaints. Forgot to bring 24hr urine back in after last visit for persistent proteinuria- still has stuff at home- will start tomorrow am and bring in Wed am.  Taking Fe as rx'd and eating high-fe foods. Fingerstick Hgb today: up to 9.3 Hasn't made it to cb classes, too late now to sign up- recommended tour. States she needs note for Dr. Lance Sell office w/ due date so they will take her baby as pt- note given.  Reviewed ptl s/s, fkc. Plan:  Continue routine obstetrical care  F/U in 2wks for OB appointment

## 2014-08-10 ENCOUNTER — Other Ambulatory Visit: Payer: Medicaid Other

## 2014-08-10 DIAGNOSIS — O1212 Gestational proteinuria, second trimester: Secondary | ICD-10-CM

## 2014-08-11 LAB — PROTEIN, URINE, 24 HOUR
Protein, 24H Urine: 230 mg/d — ABNORMAL HIGH (ref <150)
Protein, Urine: 23 mg/dL (ref 5–24)

## 2014-08-14 ENCOUNTER — Encounter: Payer: Self-pay | Admitting: Women's Health

## 2014-08-14 DIAGNOSIS — O1210 Gestational proteinuria, unspecified trimester: Secondary | ICD-10-CM | POA: Insufficient documentation

## 2014-08-16 ENCOUNTER — Telehealth: Payer: Self-pay | Admitting: Advanced Practice Midwife

## 2014-08-16 ENCOUNTER — Encounter (HOSPITAL_COMMUNITY): Payer: Self-pay | Admitting: *Deleted

## 2014-08-16 ENCOUNTER — Inpatient Hospital Stay (HOSPITAL_COMMUNITY)
Admission: AD | Admit: 2014-08-16 | Discharge: 2014-08-16 | Disposition: A | Discharge status: Home / Self Care | Payer: Medicaid Other | Source: Ambulatory Visit | Attending: Obstetrics & Gynecology | Admitting: Obstetrics & Gynecology

## 2014-08-16 ENCOUNTER — Ambulatory Visit (INDEPENDENT_AMBULATORY_CARE_PROVIDER_SITE_OTHER): Payer: Medicaid Other | Admitting: Advanced Practice Midwife

## 2014-08-16 ENCOUNTER — Encounter: Payer: Self-pay | Admitting: Advanced Practice Midwife

## 2014-08-16 VITALS — BP 160/88 | Wt 181.0 lb

## 2014-08-16 DIAGNOSIS — Z331 Pregnant state, incidental: Secondary | ICD-10-CM

## 2014-08-16 DIAGNOSIS — Z3400 Encounter for supervision of normal first pregnancy, unspecified trimester: Secondary | ICD-10-CM

## 2014-08-16 DIAGNOSIS — O10019 Pre-existing essential hypertension complicating pregnancy, unspecified trimester: Secondary | ICD-10-CM | POA: Insufficient documentation

## 2014-08-16 DIAGNOSIS — O99013 Anemia complicating pregnancy, third trimester: Secondary | ICD-10-CM

## 2014-08-16 DIAGNOSIS — Z283 Underimmunization status: Secondary | ICD-10-CM

## 2014-08-16 DIAGNOSIS — Z3403 Encounter for supervision of normal first pregnancy, third trimester: Secondary | ICD-10-CM

## 2014-08-16 DIAGNOSIS — O4700 False labor before 37 completed weeks of gestation, unspecified trimester: Secondary | ICD-10-CM | POA: Insufficient documentation

## 2014-08-16 DIAGNOSIS — G44229 Chronic tension-type headache, not intractable: Secondary | ICD-10-CM

## 2014-08-16 DIAGNOSIS — O09899 Supervision of other high risk pregnancies, unspecified trimester: Secondary | ICD-10-CM

## 2014-08-16 DIAGNOSIS — Z1389 Encounter for screening for other disorder: Secondary | ICD-10-CM

## 2014-08-16 DIAGNOSIS — O139 Gestational [pregnancy-induced] hypertension without significant proteinuria, unspecified trimester: Secondary | ICD-10-CM

## 2014-08-16 LAB — COMPREHENSIVE METABOLIC PANEL
ALK PHOS: 102 U/L (ref 39–117)
ALT: 8 U/L (ref 0–35)
ANION GAP: 18 — AB (ref 5–15)
AST: 14 U/L (ref 0–37)
Albumin: 2.7 g/dL — ABNORMAL LOW (ref 3.5–5.2)
BUN: 5 mg/dL — AB (ref 6–23)
CALCIUM: 8.6 mg/dL (ref 8.4–10.5)
CO2: 18 mEq/L — ABNORMAL LOW (ref 19–32)
Chloride: 101 mEq/L (ref 96–112)
Creatinine, Ser: 0.61 mg/dL (ref 0.50–1.10)
GFR calc non Af Amer: >90 mL/min (ref >90)
Glucose, Bld: 105 mg/dL — ABNORMAL HIGH (ref 70–99)
Potassium: 3.8 mEq/L (ref 3.7–5.3)
Sodium: 137 mEq/L (ref 137–147)
TOTAL PROTEIN: 6.2 g/dL (ref 6.0–8.3)
Total Bilirubin: 0.5 mg/dL (ref 0.3–1.2)

## 2014-08-16 LAB — PROTEIN / CREATININE RATIO, URINE
CREATININE, URINE: 188.44 mg/dL
Protein Creatinine Ratio: 0.12 (ref 0.00–0.15)
TOTAL PROTEIN, URINE: 22 mg/dL

## 2014-08-16 LAB — CBC
HCT: 31.1 % — ABNORMAL LOW (ref 36.0–46.0)
HEMOGLOBIN: 9.6 g/dL — AB (ref 12.0–15.0)
MCH: 21.7 pg — ABNORMAL LOW (ref 26.0–34.0)
MCHC: 30.9 g/dL (ref 30.0–36.0)
MCV: 70.2 fL — ABNORMAL LOW (ref 78.0–100.0)
Platelets: 227 10*3/uL (ref 150–400)
RBC: 4.43 MIL/uL (ref 3.87–5.11)
RDW: 20 % — ABNORMAL HIGH (ref 11.5–15.5)
WBC: 9.6 10*3/uL (ref 4.0–10.5)

## 2014-08-16 LAB — POCT URINALYSIS DIPSTICK
Blood, UA: NEGATIVE
GLUCOSE UA: NEGATIVE
KETONES UA: NEGATIVE
Leukocytes, UA: NEGATIVE
Nitrite, UA: NEGATIVE
Protein, UA: 1

## 2014-08-16 LAB — URINALYSIS, ROUTINE W REFLEX MICROSCOPIC
Bilirubin Urine: NEGATIVE
Glucose, UA: NEGATIVE mg/dL
Ketones, ur: 15 mg/dL — AB
Nitrite: NEGATIVE
PROTEIN: NEGATIVE mg/dL
SPECIFIC GRAVITY, URINE: 1.015 (ref 1.005–1.030)
UROBILINOGEN UA: 1 mg/dL (ref 0.0–1.0)
pH: 6.5 (ref 5.0–8.0)

## 2014-08-16 LAB — URINE MICROSCOPIC-ADD ON

## 2014-08-16 MED ORDER — TRAMADOL HCL 50 MG PO TABS
50.0000 mg | ORAL_TABLET | Freq: Once | ORAL | Status: AC
  Administered 2014-08-16: 50 mg via ORAL
  Filled 2014-08-16: qty 1

## 2014-08-16 MED ORDER — TRAMADOL HCL 50 MG PO TABS: 50.0000 mg | ORAL_TABLET | Freq: Four times a day (QID) | ORAL | Status: DC | PRN

## 2014-08-16 NOTE — Progress Notes (Signed)
Pt states that her BP has been elevated since last night. Pt states that she has had headache, blurred vision, nausea, and dizziness since Monday.

## 2014-08-16 NOTE — Telephone Encounter (Signed)
Pt states blood pressure is 172/84 and c/o really bad headache. Pt to come in at 2 pm today. Pt verbalized understanding.

## 2014-08-16 NOTE — MAU Provider Note (Signed)
First Provider Initiated Contact with Patient 08/16/14 1720      Chief Complaint:  Hypertension   Melissa Livingston is  18 y.o. G1P0 at [redacted]w[redacted]d presents complaining of Hypertension She's had persistent proteinuria with stable BPs during this pregnancy.  This afternoon she called the office and noted her BP was 172/84.  In the outpatient clinic her BP was found to be 160/88.   Per the patient, she first started headaches on last Friday, 9/4. She also endorsed blurred vision and dizziness since Friday.  Headache behind her eyes. No  Notes urinary urgency since Saturday. No dysuria.  She denies scotomata or RUQ pain. Feels she has increased edema in her LE b/l and around her face.   Notes occasional Braxton Hicks contractions ever 4-5 hours. No LOF or vaginal bleeding. Good fetal movement.  Obstetrical/Gynecological History: OB History   Grav Para Term Preterm Abortions TAB SAB Ect Mult Living   1              Past Medical History: Past Medical History  Diagnosis Date  . Asthma     Past Surgical History: Past Surgical History  Procedure Laterality Date  . None    . No past surgeries    . Esophagogastroduodenoscopy (egd) with esophageal dilation N/A 07/19/2013    Procedure: ESOPHAGOGASTRODUODENOSCOPY (EGD) WITH ESOPHAGEAL DILATION;  Surgeon: Daneil Dolin, MD;  Location: [facility];  Service: Endoscopy;  Laterality: N/A;  2:45    Family History: Family History  Problem Relation Age of Onset  . Multiple sclerosis Mother   . Colon cancer Other     maternal great uncle, age greater than 57  . Heart disease Maternal Grandfather   . Heart disease Paternal Uncle   . Diabetes Paternal Uncle   . Hypertension Paternal Uncle     Social History: History  Substance Use Topics  . Smoking status: Never Smoker   . Smokeless tobacco: Never Used  . Alcohol Use: No    Allergies:  Allergies  Allergen Reactions  . Latex Itching and Other (See Comments)    Causes a burn mark   . Lorabid  [Loracarbef] Hives and Swelling  . Orange Fruit [Citrus] Rash    Meds:  Prescriptions prior to admission  Medication Sig Dispense Refill  . aspirin 81 MG tablet Take 162 mg by mouth once.      . Prenatal Vit-Min-FA-Fish Oil ([employer] PRENATAL GUMMY PO) Take 2 tablets by mouth daily.      . ranitidine (ZANTAC) 150 MG tablet Take 150 mg by mouth 2 (two) times daily as needed for heartburn.      . Iron-FA-B Cmp-C-Biot-Probiotic (FUSION PLUS) CAPS Take 1 capsule by mouth See admin instructions. 1 time daily between meals  30 capsule  6    Review of Systems -   Review of Systems  Constitutional: Negative for fever, chills, weight loss, malaise/fatigue and diaphoresis.  HENT: Negative for hearing loss, ear pain, nosebleeds, congestion, sore throat, neck pain, tinnitus and ear discharge.   Eyes: Negative for blurred vision, double vision, photophobia, pain, discharge and redness.  Respiratory: Negative for cough, hemoptysis, sputum production, shortness of breath, wheezing and stridor.   Cardiovascular: Negative for chest pain, palpitations, orthopnea,  leg swelling  Gastrointestinal: Negative for abdominal pain heartburn, nausea, vomiting, diarrhea, constipation, blood in stool Genitourinary: Negative for dysuria, urgency, frequency, hematuria and flank pain.  Musculoskeletal: Negative for myalgias, back pain, joint pain and falls.  Skin: Negative for itching and rash.  Neurological:  Negative for dizziness, tingling, tremors, sensory change, speech change, focal weakness, seizures, loss of consciousness, weakness and headaches.  Endo/Heme/Allergies: Negative for environmental allergies and polydipsia. Does not bruise/bleed easily.  Psychiatric/Behavioral: Negative for depression, suicidal ideas, hallucinations, memory loss and substance abuse. The patient is not nervous/anxious and does not have insomnia.      Physical Exam  Blood pressure 130/75, pulse 123, temperature 97.9 F (36.6 C),  temperature source Oral, resp. rate 18, height 5\' 1"  (1.549 m), weight 82.101 kg (181 lb), last menstrual period 07/18/2013. GENERAL: Well-developed, well-nourished female in no acute distress.  LUNGS: Clear to auscultation bilaterally.  HEART: Tachycardic. Regular rhythm. ABDOMEN: Soft, nontender, nondistended, gravid.  EXTREMITIES: Nontender, no edema, 2+ distal pulses. DTR's 2+ FHT:  Baseline rate 140 bpm   Variability moderate  Accelerations present   Decelerations none Contractions: None   Labs: Results for orders placed during the hospital encounter of 08/16/14 (from the past 24 hour(s))  URINALYSIS, ROUTINE W REFLEX MICROSCOPIC   Collection Time    08/16/14  4:13 PM      Result Value Ref Range   Color, Urine YELLOW  YELLOW   APPearance CLEAR  CLEAR   Specific Gravity, Urine 1.015  1.005 - 1.030   pH 6.5  5.0 - 8.0   Glucose, UA NEGATIVE  NEGATIVE mg/dL   Hgb urine dipstick TRACE (*) NEGATIVE   Bilirubin Urine NEGATIVE  NEGATIVE   Ketones, ur 15 (*) NEGATIVE mg/dL   Protein, ur NEGATIVE  NEGATIVE mg/dL   Urobilinogen, UA 1.0  0.0 - 1.0 mg/dL   Nitrite NEGATIVE  NEGATIVE   Leukocytes, UA LARGE (*) NEGATIVE  PROTEIN / CREATININE RATIO, URINE   Collection Time    08/16/14  4:13 PM      Result Value Ref Range   Creatinine, Urine 188.44     Total Protein, Urine 22     PROTEIN CREATININE RATIO 0.12  0.00 - 0.15  URINE MICROSCOPIC-ADD ON   Collection Time    08/16/14  4:13 PM      Result Value Ref Range   Squamous Epithelial / LPF FEW (*) RARE   WBC, UA 11-20  <3 WBC/hpf   RBC / HPF 0-2  <3 RBC/hpf   Bacteria, UA MANY (*) RARE   Crystals CA OXALATE CRYSTALS (*) NEGATIVE   Urine-Other RARE YEAST    CBC   Collection Time    08/16/14  4:29 PM      Result Value Ref Range   WBC 9.6  4.0 - 10.5 K/uL   RBC 4.43  3.87 - 5.11 MIL/uL   Hemoglobin 9.6 (*) 12.0 - 15.0 g/dL   HCT 31.1 (*) 36.0 - 46.0 %   MCV 70.2 (*) 78.0 - 100.0 fL   MCH 21.7 (*) 26.0 - 34.0 pg   MCHC 30.9   30.0 - 36.0 g/dL   RDW 20.0 (*) 11.5 - 15.5 %   Platelets 227  150 - 400 K/uL  POCT URINALYSIS DIPSTICK   Collection Time    08/16/14  2:15 PM      Result Value Ref Range   Color, UA       Clarity, UA       Glucose, UA neg     Bilirubin, UA       Ketones, UA neg     Spec Grav, UA       Blood, UA neg     pH, UA       Protein, UA 1  Urobilinogen, UA       Nitrite, UA neg     Leukocytes, UA Negative     Imaging Studies:  No results found.  Assessment: Neda Bracewell is  18 y.o. G1P0 at [redacted]w[redacted]d presents with elevated BPs at home and 1 BP of 160/88 in clinic with headache and blurred vision since Friday. She's had baseline proteinuria throughout pregnancy. Urine Pro/Cr 0.12. Creatinine, LFTs, platelets all wnl. The patient wears glasses and states she hasn't been back to see if her prescription needs to be changed in over a year. This could be one explanation for her headaches/blurred vision. Unable to take Tylenol as it leads to GI discomfort.  Patient's mother notes very high BPs at home with their wrist monitor. Wrist monitor from home noted a BP of 157/92 while the hospital BP cuff noted 127/77. Suggested changing the batteries or obtaining a new arm cuff, however I feel this may not be necessary. Pt was tachycardic on exam, however in discomfort due to her headache. She did not look dehydrated from physical exam or on labwork.  U/A did show large leukocytes and rare yeast but also had squamous cells. No urinary symptoms currently.   Plan: - Will discharge with tramadol for her headaches - Pre-eclampsia return precautions discussed - Continue to monitor spot protein/creatinine and BPs regularly as an outpatient.  Archie Patten 9/10/20155:27 PM  I have seen and examined this patient and agree the above assessment. CRESENZO-DISHMAN,Shirly Bartosiewicz 08/16/2014 9:41 PM

## 2014-08-16 NOTE — MAU Note (Signed)
Sent from OB's office for PIH eval;  

## 2014-08-16 NOTE — Discharge Instructions (Signed)

## 2014-08-16 NOTE — Progress Notes (Signed)
G1P0 [redacted]w[redacted]d Estimated Date of Delivery: 09/30/14  Blood pressure 160/88, weight 181 lb (82.101 kg), last menstrual period 07/18/2013.   BP weight and urine results all reviewed and noted. Has had frontal HA off and on since Monday (too two ASA, helped a little:  APAP makes her stomach hurt).  BP checked last night.  All b/p >161 systolic, 09'U diastolic.  Today she has had a 160/112 and 172/100 at home  1+ pedal edema, 2+-3+ reflexes.  Denies RUQ pain.   Patient reports good fetal movement, denies any bleeding and no rupture of membranes symptoms or regular contractions.  All questions were answered.  Plan:  To MAU for PreX labs, BP;s (pt to bring home cuff for comparison)  Follow up in Monday for OB appointment, bp check if not admitted

## 2014-08-20 ENCOUNTER — Ambulatory Visit (INDEPENDENT_AMBULATORY_CARE_PROVIDER_SITE_OTHER): Payer: Medicaid Other | Admitting: Women's Health

## 2014-08-20 ENCOUNTER — Encounter: Payer: Self-pay | Admitting: Women's Health

## 2014-08-20 VITALS — BP 128/78 | Wt 186.0 lb

## 2014-08-20 DIAGNOSIS — Z331 Pregnant state, incidental: Secondary | ICD-10-CM

## 2014-08-20 DIAGNOSIS — Z1389 Encounter for screening for other disorder: Secondary | ICD-10-CM

## 2014-08-20 DIAGNOSIS — Z3403 Encounter for supervision of normal first pregnancy, third trimester: Secondary | ICD-10-CM

## 2014-08-20 DIAGNOSIS — Z3400 Encounter for supervision of normal first pregnancy, unspecified trimester: Secondary | ICD-10-CM

## 2014-08-20 LAB — POCT URINALYSIS DIPSTICK
GLUCOSE UA: NEGATIVE
Ketones, UA: NEGATIVE
Nitrite, UA: NEGATIVE
Protein, UA: NEGATIVE
RBC UA: NEGATIVE

## 2014-08-20 NOTE — Patient Instructions (Signed)
Call the office [phone]) or go to [facility] hospital for these signs of pre-eclampsia:  Severe headache that does not go away with Tylenol  Visual changes- seeing spots, double, blurred vision  Pain under your right breast or upper abdomen that does not go away with Tums or heartburn medicine  Nausea and/or vomiting  Severe swelling in your hands, feet, and face    Call the office [phone]) or go to [facility] if:  You begin to have strong, frequent contractions  Your water breaks.  Sometimes it is a big gush of fluid, sometimes it is just a trickle that keeps getting your panties wet or running down your legs  You have vaginal bleeding.  It is normal to have a small amount of spotting if your cervix was checked.   You don't feel your baby moving like normal.  If you don't, get you something to eat and drink and lay down and focus on feeling your baby move.  You should feel at least 10 movements in 2 hours.  If you don't, you should call the office or go to [facility] Preterm labor is when labor starts at less than 37 weeks of pregnancy. The normal length of a pregnancy is 39 to 41 weeks. CAUSES Often, there is no identifiable underlying cause as to why a woman goes into preterm labor. One of the most common known causes of preterm labor is infection. Infections of the uterus, cervix, vagina, amniotic sac, bladder, kidney, or even the lungs (pneumonia) can cause labor to start. Other suspected causes of preterm labor include:   Urogenital infections, such as yeast infections and bacterial vaginosis.   Uterine abnormalities (uterine shape, uterine septum, fibroids, or bleeding from the placenta).   A cervix that has been operated on (it may fail to stay closed).   Malformations in the fetus.   Multiple gestations (twins, triplets, and so on).   Breakage of the amniotic sac.  RISK FACTORS  Having a previous history of preterm  labor.   Having premature rupture of membranes (PROM).   Having a placenta that covers the opening of the cervix (placenta previa).   Having a placenta that separates from the uterus (placental abruption).   Having a cervix that is too weak to hold the fetus in the uterus (incompetent cervix).   Having too much fluid in the amniotic sac (polyhydramnios).   Taking illegal drugs or smoking while pregnant.   Not gaining enough weight while pregnant.   Being younger than 45 and older than 20 years old.   Having a low socioeconomic status.   Being African American. SYMPTOMS Signs and symptoms of preterm labor include:   Menstrual-like cramps, abdominal pain, or back pain.  Uterine contractions that are regular, as frequent as six in an hour, regardless of their intensity (may be mild or painful).  Contractions that start on the top of the uterus and spread down to the lower abdomen and back.   A sense of increased pelvic pressure.   A watery or bloody mucus discharge that comes from the vagina.  TREATMENT Depending on the length of the pregnancy and other circumstances, your health care provider may suggest bed rest. If necessary, there are medicines that can be given to stop contractions and to mature the fetal lungs. If labor happens before 34 weeks of pregnancy, a prolonged hospital stay may be recommended. Treatment depends on the condition of both you and the fetus.  WHAT SHOULD YOU DO IF YOU THINK YOU ARE IN PRETERM LABOR? Call your health care provider right away. You will need to go to the hospital to get checked immediately. HOW CAN YOU PREVENT PRETERM LABOR IN FUTURE PREGNANCIES? You should:   Stop smoking if you smoke.  Maintain healthy weight gain and avoid chemicals and drugs that are not necessary.  Be watchful for any type of infection.  Inform your health care provider if you have a known history of preterm labor. Document Released: 02/13/2004  Document Revised: 07/26/2013 Document Reviewed: 12/26/2012 [facility] Patient Information 2015 [place], [place]. This information is not intended to replace advice given to you by your health care provider. Make sure you discuss any questions you have with your health care provider.

## 2014-08-20 NOTE — Progress Notes (Signed)
Low-risk OB appointment G1P0 [redacted]w[redacted]d Estimated Date of Delivery: 09/30/14 BP 128/78  Wt 186 lb (84.369 kg)  LMP 07/18/2013  BP, weight, and urine reviewed.  Refer to obstetrical flow sheet for FH & FHR.  Reports good fm.  Denies regular uc's, lof, vb, or uti s/s. Was sent to [facility] last thurs for pre-e eval, all bp's and labs were normal, states all home bp's have been normal since.  Still having daily frontal ha's, tramadol helps some. Offered to try fioricet, wants to stick w/ tramadol for now.  Reviewed ptl s/s, fkc, pre-e s/s. Plan:  Continue routine obstetrical care  F/U in 1wk for OB appointment

## 2014-08-23 ENCOUNTER — Telehealth: Payer: Self-pay | Admitting: Advanced Practice Midwife

## 2014-08-23 NOTE — Telephone Encounter (Signed)
Pt states blood pressure fluctuating thru out the day, range from 150/80 - 140/81. Pt states swelling legs hands and feet since yesterday. Pt informed to decrease salt intake, push fluids, elevate extremities, continue to monitor blood pressure anything over 150/90 to call office back. Pt verbalized understanding.

## 2014-08-23 NOTE — Telephone Encounter (Signed)
Pt informed to bring cuff at appt on 09/21. If blood pressures 150/100 x 2 pt to call our office per Nigel Berthold, CNM.

## 2014-08-23 NOTE — Telephone Encounter (Signed)
Melissa Livingston, this sounds fine. She has appt 9/21. If you wind up talking to her again, advise her to bring her cuff with her to the visit. At the hospital last week, it was reading 170/? When the hospital cuff read 120/70.  She was going to change batteries, so we could compare it to our readings to make sure it is accurate. At any rate, 150/100 or above X2 would warrant her coming here for a [place] b/p check. Thank! Manus Gunning

## 2014-08-27 ENCOUNTER — Encounter: Payer: Self-pay | Admitting: Women's Health

## 2014-08-27 ENCOUNTER — Ambulatory Visit (INDEPENDENT_AMBULATORY_CARE_PROVIDER_SITE_OTHER): Payer: Medicaid Other | Admitting: Women's Health

## 2014-08-27 VITALS — BP 126/72 | Wt 185.0 lb

## 2014-08-27 DIAGNOSIS — O368190 Decreased fetal movements, unspecified trimester, not applicable or unspecified: Secondary | ICD-10-CM

## 2014-08-27 DIAGNOSIS — Z331 Pregnant state, incidental: Secondary | ICD-10-CM

## 2014-08-27 DIAGNOSIS — O368131 Decreased fetal movements, third trimester, fetus 1: Secondary | ICD-10-CM

## 2014-08-27 DIAGNOSIS — Z3403 Encounter for supervision of normal first pregnancy, third trimester: Secondary | ICD-10-CM

## 2014-08-27 DIAGNOSIS — Z1389 Encounter for screening for other disorder: Secondary | ICD-10-CM

## 2014-08-27 DIAGNOSIS — Z3400 Encounter for supervision of normal first pregnancy, unspecified trimester: Secondary | ICD-10-CM

## 2014-08-27 LAB — POCT URINALYSIS DIPSTICK
Blood, UA: NEGATIVE
GLUCOSE UA: NEGATIVE
KETONES UA: NEGATIVE
Nitrite, UA: NEGATIVE

## 2014-08-27 MED ORDER — BUTALBITAL-APAP-CAFFEINE 50-325-40 MG PO TABS: 1.0000 | ORAL_TABLET | ORAL | Status: DC | PRN

## 2014-08-27 MED ORDER — PANTOPRAZOLE SODIUM 20 MG PO TBEC: 20.0000 mg | DELAYED_RELEASE_TABLET | Freq: Every day | ORAL | Status: DC

## 2014-08-27 NOTE — Patient Instructions (Signed)
Call the office ([phone]) or go to [facility] if:  You begin to have strong, frequent contractions  Your water breaks.  Sometimes it is a big gush of fluid, sometimes it is just a trickle that keeps getting your panties wet or running down your legs  You have vaginal bleeding.  It is normal to have a small amount of spotting if your cervix was checked.   You don't feel your baby moving like normal.  If you don't, get you something to eat and drink and lay down and focus on feeling your baby move.  You should feel at least 10 movements in 2 hours.  If you don't, you should call the office or go to [facility].    Preterm Labor Information Preterm labor is when labor starts at less than 37 weeks of pregnancy. The normal length of a pregnancy is 39 to 41 weeks. CAUSES Often, there is no identifiable underlying cause as to why a woman goes into preterm labor. One of the most common known causes of preterm labor is infection. Infections of the uterus, cervix, vagina, amniotic sac, bladder, kidney, or even the lungs (pneumonia) can cause labor to start. Other suspected causes of preterm labor include:   Urogenital infections, such as yeast infections and bacterial vaginosis.   Uterine abnormalities (uterine shape, uterine septum, fibroids, or bleeding from the placenta).   A cervix that has been operated on (it may fail to stay closed).   Malformations in the fetus.   Multiple gestations (twins, triplets, and so on).   Breakage of the amniotic sac.  RISK FACTORS  Having a previous history of preterm labor.   Having premature rupture of membranes (PROM).   Having a placenta that covers the opening of the cervix (placenta previa).   Having a placenta that separates from the uterus (placental abruption).   Having a cervix that is too weak to hold the fetus in the uterus (incompetent cervix).   Having too much fluid in the amniotic sac (polyhydramnios).   Taking  illegal drugs or smoking while pregnant.   Not gaining enough weight while pregnant.   Being younger than 18 and older than 20 years old.   Having a low socioeconomic status.   Being African American. SYMPTOMS Signs and symptoms of preterm labor include:   Menstrual-like cramps, abdominal pain, or back pain.  Uterine contractions that are regular, as frequent as six in an hour, regardless of their intensity (may be mild or painful).  Contractions that start on the top of the uterus and spread down to the lower abdomen and back.   A sense of increased pelvic pressure.   A watery or bloody mucus discharge that comes from the vagina.  TREATMENT Depending on the length of the pregnancy and other circumstances, your health care provider may suggest bed rest. If necessary, there are medicines that can be given to stop contractions and to mature the fetal lungs. If labor happens before 34 weeks of pregnancy, a prolonged hospital stay may be recommended. Treatment depends on the condition of both you and the fetus.  WHAT SHOULD YOU DO IF YOU THINK YOU ARE IN PRETERM LABOR? Call your health care provider right away. You will need to go to the hospital to get checked immediately. HOW CAN YOU PREVENT PRETERM LABOR IN FUTURE PREGNANCIES? You should:   Stop smoking if you smoke.  Maintain healthy weight gain and avoid chemicals and drugs that are not necessary.  Be watchful for   any type of infection.  Inform your health care provider if you have a known history of preterm labor. Document Released: 02/13/2004 Document Revised: 07/26/2013 Document Reviewed: 12/26/2012 [employer] Patient Information 2015 [employer]. This information is not intended to replace advice given to you by your health care provider. Make sure you discuss any questions you have with your health care provider.  

## 2014-08-27 NOTE — Progress Notes (Signed)
Low-risk OB appointment G1P0 [redacted]w[redacted]d Estimated Date of Delivery: 09/30/14 BP 126/72  Wt 185 lb (83.915 kg)  LMP 07/18/2013  BP, weight, and urine reviewed.  Refer to obstetrical flow sheet for FH & FHR.  Decreased fm x 2d.  Denies regular uc's, lof, vb, or uti s/s. Has been checking bp's q 1.5-2hrs at home!!! Strongly urged that if she is concerned about bp's to only check a maximum of 2x/day. Most of bp's at home have been normal, occ 140s/80s, possibly d/t high anxiety over checking bp's so often. Brought in her home bp monitor, I rechecked manual bp: 110/80, her monitor: 791/50, mild systolic discrepancy. BPs here last 2 visits great! Trace proteinuria, which is nothing new for her as she has had proteinuria the entire pregnancy. Still having ha's, tramadol not working at all now, will switch to fioricet. Heartburn, zantac not really helping anymore, will rx protonix. Had some vomiting the other day. Denies ruq/epigastric pain, visual changes.  DTRs: 2+ Clonus:none Edema:trace NST reactive, pt didn't feel any fm the entire time Reviewed ptl s/s, pre-e s/s, fkc. Plan:  Continue routine obstetrical care  F/U in 1wk for OB appointment

## 2014-09-02 ENCOUNTER — Encounter (HOSPITAL_COMMUNITY): Payer: Self-pay | Admitting: *Deleted

## 2014-09-02 ENCOUNTER — Inpatient Hospital Stay (HOSPITAL_COMMUNITY)
Admission: AD | Admit: 2014-09-02 | Discharge: 2014-09-02 | Disposition: A | Discharge status: Home / Self Care | Payer: 59 | Source: Ambulatory Visit | Attending: Student | Admitting: Student

## 2014-09-02 DIAGNOSIS — O99891 Other specified diseases and conditions complicating pregnancy: Secondary | ICD-10-CM | POA: Insufficient documentation

## 2014-09-02 DIAGNOSIS — O4700 False labor before 37 completed weeks of gestation, unspecified trimester: Secondary | ICD-10-CM | POA: Diagnosis not present

## 2014-09-02 DIAGNOSIS — O09899 Supervision of other high risk pregnancies, unspecified trimester: Secondary | ICD-10-CM

## 2014-09-02 DIAGNOSIS — N898 Other specified noninflammatory disorders of vagina: Secondary | ICD-10-CM | POA: Diagnosis not present

## 2014-09-02 DIAGNOSIS — Z283 Underimmunization status: Secondary | ICD-10-CM

## 2014-09-02 DIAGNOSIS — O26893 Other specified pregnancy related conditions, third trimester: Secondary | ICD-10-CM

## 2014-09-02 DIAGNOSIS — Z3403 Encounter for supervision of normal first pregnancy, third trimester: Secondary | ICD-10-CM

## 2014-09-02 DIAGNOSIS — O99013 Anemia complicating pregnancy, third trimester: Secondary | ICD-10-CM

## 2014-09-02 DIAGNOSIS — O9989 Other specified diseases and conditions complicating pregnancy, childbirth and the puerperium: Secondary | ICD-10-CM

## 2014-09-02 LAB — POCT FERN TEST: POCT Fern Test: NEGATIVE

## 2014-09-02 NOTE — MAU Provider Note (Signed)
Attestation of Attending Supervision of Advanced Practitioner: Evaluation and management procedures were performed by the PA/NP/CNM/OB Fellow under my supervision/collaboration. Chart reviewed and agree with management and plan.  Cadon Raczka V 09/02/2014 4:44 PM

## 2014-09-02 NOTE — MAU Provider Note (Signed)
History     CSN: [id number]  Arrival date and time: 09/02/14 1421   None     Chief Complaint  Patient presents with  . Rupture of Membranes   HPI This is a 18 y.o. female at [redacted]w[redacted]d who presents with c/o one episode of leaking fluid. Has some irregular contractions, described as painful.   RN Note:  Possible rupture of membranes around 1200, clear fluid. Contractions pain 8/10, denies VB.       OB History   Grav Para Term Preterm Abortions TAB SAB Ect Mult Living   1               Past Medical History  Diagnosis Date  . Asthma     Past Surgical History  Procedure Laterality Date  . None    . No past surgeries    . Esophagogastroduodenoscopy (egd) with esophageal dilation N/A 07/19/2013    Procedure: ESOPHAGOGASTRODUODENOSCOPY (EGD) WITH ESOPHAGEAL DILATION;  Surgeon: Daneil Dolin, MD;  Location: [facility];  Service: Endoscopy;  Laterality: N/A;  2:45    Family History  Problem Relation Age of Onset  . Multiple sclerosis Mother   . Colon cancer Other     maternal great uncle, age greater than 68  . Heart disease Maternal Grandfather   . Heart disease Paternal Uncle   . Diabetes Paternal Uncle   . Hypertension Paternal Uncle     History  Substance Use Topics  . Smoking status: Never Smoker   . Smokeless tobacco: Never Used  . Alcohol Use: No    Allergies:  Allergies  Allergen Reactions  . Latex Itching and Other (See Comments)    Causes a burn mark   . Lorabid [Loracarbef] Hives and Swelling  . Orange Fruit [Citrus] Rash    Prescriptions prior to admission  Medication Sig Dispense Refill  . butalbital-acetaminophen-caffeine (FIORICET) 50-325-40 MG per tablet Take 1 tablet by mouth every 4 (four) hours as needed for headache.  20 tablet  0  . Iron-FA-B Cmp-C-Biot-Probiotic (FUSION PLUS) CAPS Take 1 capsule by mouth See admin instructions. 1 time daily between meals  30 capsule  6  . pantoprazole (PROTONIX) 20 MG tablet Take 1 tablet (20 mg  total) by mouth daily.  30 tablet  0  . Prenatal Vit-Min-FA-Fish Oil ([employer] PRENATAL GUMMY PO) Take 2 tablets by mouth daily.        Review of Systems  Constitutional: Negative for fever, chills and malaise/fatigue.  Gastrointestinal: Positive for abdominal pain. Negative for nausea, vomiting, diarrhea and constipation.   Physical Exam   Blood pressure 152/72, pulse 125, temperature 98.3 F (36.8 C), temperature source Oral, resp. rate 18, last menstrual period 07/18/2013.  Physical Exam  Constitutional: She is oriented to person, place, and time. She appears well-developed and well-nourished. No distress.  Cardiovascular: Normal rate.   Respiratory: Effort normal.  GI: Soft.  Genitourinary: Vaginal discharge (thick white, no pooling, no ferning) found.  Cervix closed. long    Musculoskeletal: Normal range of motion.  Neurological: She is alert and oriented to person, place, and time.  Skin: Skin is warm and dry.  Psychiatric: She has a normal mood and affect.    MAU Course  Procedures  MDM FHR reactive Irregular contractions   Assessment and Plan  A:  SIUP at [redacted]w[redacted]d        Vaginal discharge, normal leukorrhea       Not in labor  P:  Discharge home  Followup in clinic  [facility] 09/02/2014, 3:21 PM

## 2014-09-02 NOTE — Discharge Instructions (Signed)

## 2014-09-02 NOTE — MAU Note (Signed)
Possible rupture of membranes around 1200, clear fluid.  Contractions pain 8/10, denies VB.

## 2014-09-03 ENCOUNTER — Ambulatory Visit (INDEPENDENT_AMBULATORY_CARE_PROVIDER_SITE_OTHER): Payer: Medicaid Other | Admitting: Women's Health

## 2014-09-03 ENCOUNTER — Encounter: Payer: Self-pay | Admitting: Women's Health

## 2014-09-03 VITALS — BP 126/78 | Wt 187.0 lb

## 2014-09-03 DIAGNOSIS — N898 Other specified noninflammatory disorders of vagina: Secondary | ICD-10-CM

## 2014-09-03 DIAGNOSIS — O26893 Other specified pregnancy related conditions, third trimester: Secondary | ICD-10-CM

## 2014-09-03 DIAGNOSIS — Z1389 Encounter for screening for other disorder: Secondary | ICD-10-CM

## 2014-09-03 DIAGNOSIS — O2390 Unspecified genitourinary tract infection in pregnancy, unspecified trimester: Secondary | ICD-10-CM

## 2014-09-03 DIAGNOSIS — Z3400 Encounter for supervision of normal first pregnancy, unspecified trimester: Secondary | ICD-10-CM

## 2014-09-03 DIAGNOSIS — Z331 Pregnant state, incidental: Secondary | ICD-10-CM

## 2014-09-03 DIAGNOSIS — O99013 Anemia complicating pregnancy, third trimester: Secondary | ICD-10-CM

## 2014-09-03 DIAGNOSIS — Z3403 Encounter for supervision of normal first pregnancy, third trimester: Secondary | ICD-10-CM

## 2014-09-03 LAB — POCT URINALYSIS DIPSTICK
Blood, UA: NEGATIVE
GLUCOSE UA: NEGATIVE
Ketones, UA: NEGATIVE
NITRITE UA: NEGATIVE

## 2014-09-03 LAB — POCT WET PREP (WET MOUNT): Clue Cells Wet Prep Whiff POC: NEGATIVE

## 2014-09-03 MED ORDER — FLUCONAZOLE 150 MG PO TABS: 150.0000 mg | ORAL_TABLET | Freq: Once | ORAL | Status: DC

## 2014-09-03 NOTE — Progress Notes (Addendum)
Low-risk OB appointment G1P0 [redacted]w[redacted]d Estimated Date of Delivery: 09/30/14 BP 126/78  Wt 187 lb (84.823 kg)  LMP 07/18/2013  BP, weight, and urine reviewed.  Refer to obstetrical flow sheet for FH & FHR.  Reports good fm.  Denies vb, or uti s/s. Went to [employer] [facility] yesterday for r/o rom, was pooling/fern neg. States she had more leakage today. Denies vag itching/irritation. UCs q 1mins. Appears to be uncomfortable w/ uc's. Home bp's have been normal. Still has daily frontal ha's- fioricet not helping either- will contact [facility] for other options/consult. Denies scotomata, ruq/epigastric pain, n/v.    SSE: cx visually closed, thick clumpy yellowish-white nonodorous d/c adherent to cx/vaginal walls, thinner yellowish-white d/c posterior fornix- all c/w yeast, nothing from os w/ cough/valsalva SVE: tight 1/th/ballotable, vtx Wet prep: many wbc's, mod yeast, otherwise neg- will send gc/ct Nitrazine and fern neg  Rx diflucan Reviewed ptl s/s, fkc. Increase fluids, enough so urine is clear.  Plan:  Continue routine obstetrical care  F/U in 1wk for OB appointment and gbs

## 2014-09-03 NOTE — Patient Instructions (Signed)
Call the office [phone]) or go to [facility] if:  You begin to have strong, frequent contractions  Your water breaks.  Sometimes it is a big gush of fluid, sometimes it is just a trickle that keeps getting your panties wet or running down your legs  You have vaginal bleeding.  It is normal to have a small amount of spotting if your cervix was checked.   You don't feel your baby moving like normal.  If you don't, get you something to eat and drink and lay down and focus on feeling your baby move.  You should feel at least 10 movements in 2 hours.  If you don't, you should call the office or go to [facility] Vaginitis in a soreness, swelling and redness (inflammation) of the vagina and vulva. Monilial vaginitis is not a sexually transmitted infection. CAUSES  Yeast vaginitis is caused by yeast (candida) that is normally found in your vagina. With a yeast infection, the candida has overgrown in number to a point that upsets the chemical balance. SYMPTOMS   White, thick vaginal discharge.  Swelling, itching, redness and irritation of the vagina and possibly the lips of the vagina (vulva).  Burning or painful urination.  Painful intercourse. DIAGNOSIS  Things that may contribute to monilial vaginitis are:  Postmenopausal and virginal states.  Pregnancy.  Infections.  Being tired, sick or stressed, especially if you had monilial vaginitis in the past.  Diabetes. Good control will help lower the chance.  Birth control pills.  Tight fitting garments.  Using bubble bath, feminine sprays, douches or deodorant tampons.  Taking certain medications that kill germs (antibiotics).  Sporadic recurrence can occur if you become ill. TREATMENT  Your caregiver will give you medication.  There are several kinds of anti monilial vaginal creams and suppositories specific for monilial vaginitis. For recurrent yeast infections, use a suppository or cream in  the vagina 2 times a week, or as directed.  Anti-monilial or steroid cream for the itching or irritation of the vulva may also be used. Get your caregiver's permission.  Painting the vagina with methylene blue solution may help if the monilial cream does not work.  Eating yogurt may help prevent monilial vaginitis. HOME CARE INSTRUCTIONS   Finish all medication as prescribed.  Do not have sex until treatment is completed or after your caregiver tells you it is okay.  Take warm sitz baths.  Do not douche.  Do not use tampons, especially scented ones.  Wear cotton underwear.  Avoid tight pants and panty hose.  Tell your sexual partner that you have a yeast infection. They should go to their caregiver if they have symptoms such as mild rash or itching.  Your sexual partner should be treated as well if your infection is difficult to eliminate.  Practice safer sex. Use condoms.  Some vaginal medications cause latex condoms to fail. Vaginal medications that harm condoms are:  Cleocin cream.  Butoconazole (Femstat).  Terconazole (Terazol) vaginal suppository.  Miconazole (Monistat) (may be purchased over the counter). SEEK MEDICAL CARE IF:   You have a temperature by mouth above 102 F (38.9 C).  The infection is getting worse after 2 days of treatment.  The infection is not getting better after 3 days of treatment.  You develop blisters in or around your vagina.  You develop vaginal bleeding, and it is not your menstrual period.  You have pain when you urinate.  You develop intestinal problems.  You have  pain with sexual intercourse. Document Released: 09/02/2005 Document Revised: 02/15/2012 Document Reviewed: 05/17/2009 [facility] Patient Information 2015 [place], [place]. This information is not intended to replace advice given to you by your health care provider. Make sure you discuss any questions you have with your health care provider.

## 2014-09-04 ENCOUNTER — Encounter: Payer: Self-pay | Admitting: Women's Health

## 2014-09-04 LAB — GC/CHLAMYDIA PROBE AMP
CT PROBE, AMP APTIMA: NEGATIVE
GC PROBE AMP APTIMA: NEGATIVE

## 2014-09-07 ENCOUNTER — Encounter (HOSPITAL_COMMUNITY): Payer: Self-pay | Admitting: *Deleted

## 2014-09-07 ENCOUNTER — Inpatient Hospital Stay (HOSPITAL_COMMUNITY)
Admission: AD | Admit: 2014-09-07 | Discharge: 2014-09-07 | Disposition: A | Discharge status: Home / Self Care | Payer: 59 | Source: Ambulatory Visit | Attending: Obstetrics & Gynecology | Admitting: Obstetrics & Gynecology

## 2014-09-07 DIAGNOSIS — R1030 Lower abdominal pain, unspecified: Secondary | ICD-10-CM | POA: Insufficient documentation

## 2014-09-07 DIAGNOSIS — Z3A36 36 weeks gestation of pregnancy: Secondary | ICD-10-CM | POA: Diagnosis not present

## 2014-09-07 DIAGNOSIS — R51 Headache: Secondary | ICD-10-CM | POA: Diagnosis not present

## 2014-09-07 DIAGNOSIS — O9989 Other specified diseases and conditions complicating pregnancy, childbirth and the puerperium: Secondary | ICD-10-CM | POA: Insufficient documentation

## 2014-09-07 DIAGNOSIS — O99013 Anemia complicating pregnancy, third trimester: Secondary | ICD-10-CM

## 2014-09-07 DIAGNOSIS — O09899 Supervision of other high risk pregnancies, unspecified trimester: Secondary | ICD-10-CM

## 2014-09-07 DIAGNOSIS — Z283 Underimmunization status: Secondary | ICD-10-CM

## 2014-09-07 DIAGNOSIS — Z3403 Encounter for supervision of normal first pregnancy, third trimester: Secondary | ICD-10-CM

## 2014-09-07 LAB — COMPREHENSIVE METABOLIC PANEL
ALT: 10 U/L (ref 0–35)
AST: 14 U/L (ref 0–37)
Albumin: 2.7 g/dL — ABNORMAL LOW (ref 3.5–5.2)
Alkaline Phosphatase: 139 U/L — ABNORMAL HIGH (ref 39–117)
Anion gap: 10 (ref 5–15)
BUN: 4 mg/dL — ABNORMAL LOW (ref 6–23)
CO2: 23 meq/L (ref 19–32)
Calcium: 8.6 mg/dL (ref 8.4–10.5)
Chloride: 102 mEq/L (ref 96–112)
Creatinine, Ser: 0.66 mg/dL (ref 0.50–1.10)
GFR calc non Af Amer: >90 mL/min (ref >90)
GLUCOSE: 100 mg/dL — AB (ref 70–99)
POTASSIUM: 3.4 meq/L — AB (ref 3.7–5.3)
SODIUM: 135 meq/L — AB (ref 137–147)
TOTAL PROTEIN: 6.6 g/dL (ref 6.0–8.3)
Total Bilirubin: 0.4 mg/dL (ref 0.3–1.2)

## 2014-09-07 LAB — CBC
HCT: 32 % — ABNORMAL LOW (ref 36.0–46.0)
Hemoglobin: 9.7 g/dL — ABNORMAL LOW (ref 12.0–15.0)
MCH: 21 pg — ABNORMAL LOW (ref 26.0–34.0)
MCHC: 30.3 g/dL (ref 30.0–36.0)
MCV: 69.1 fL — ABNORMAL LOW (ref 78.0–100.0)
Platelets: 271 10*3/uL (ref 150–400)
RBC: 4.63 MIL/uL (ref 3.87–5.11)
RDW: 20 % — ABNORMAL HIGH (ref 11.5–15.5)
WBC: 9.5 10*3/uL (ref 4.0–10.5)

## 2014-09-07 LAB — PROTEIN / CREATININE RATIO, URINE
Creatinine, Urine: 136.47 mg/dL
Protein Creatinine Ratio: 0.12 (ref 0.00–0.15)
Total Protein, Urine: 16.9 mg/dL

## 2014-09-07 MED ORDER — MORPHINE SULFATE 4 MG/ML IJ SOLN
2.0000 mg | Freq: Once | INTRAMUSCULAR | Status: AC
  Administered 2014-09-07: 2 mg via INTRAVENOUS
  Filled 2014-09-07: qty 1

## 2014-09-07 MED ORDER — DEXTROSE 5 % IV SOLN
20.0000 mg | Freq: Once | INTRAVENOUS | Status: AC
  Administered 2014-09-07: 20 mg via INTRAVENOUS
  Filled 2014-09-07: qty 4

## 2014-09-07 MED ORDER — LACTATED RINGERS IV BOLUS (SEPSIS): 1000.0000 mL | Freq: Once | INTRAVENOUS | Status: DC

## 2014-09-07 MED ORDER — SODIUM CHLORIDE 0.9 % IV BOLUS (SEPSIS): 1000.0000 mL | Freq: Once | INTRAVENOUS | Status: DC

## 2014-09-07 NOTE — MAU Provider Note (Signed)
History     CSN: [id number]  Arrival date and time: 09/07/14 1650   First Provider Initiated Contact with Patient 09/07/14 1747      Chief Complaint  Patient presents with  . Labor Eval   HPI  Melissa Livingston is a 18 y.o. G1P0 at [redacted]w[redacted]d by 7W ultrasound who presents with lower abdominal pain since midnight last night. The pain is constant and worsening. Patient states that she thinks it may be contractions. No loss of fluid, or vaginal bleeding. Reports good fetal movements.  Patient also with significant history of hypertension during this pregnancy. States that her blood pressure has been as high as 180s/100s. Also reports a frontal headache for the past 3 weeks that has not been alleviated with tylenol. Says that whatever she gets "just knocks me out." Also reports some blurred vision when watching TV at home.   OB History   Grav Para Term Preterm Abortions TAB SAB Ect Mult Living   1               Past Medical History  Diagnosis Date  . Asthma     Past Surgical History  Procedure Laterality Date  . None    . Esophagogastroduodenoscopy (egd) with esophageal dilation N/A 07/19/2013    Procedure: ESOPHAGOGASTRODUODENOSCOPY (EGD) WITH ESOPHAGEAL DILATION;  Surgeon: Daneil Dolin, MD;  [facility];  Service: Endoscopy;  Laterality: N/A;  2:45    Family History  Problem Relation Age of Onset  . Multiple sclerosis Mother   . Colon cancer Other     maternal great uncle, age greater than 35  . Heart disease Maternal Grandfather   . Heart disease Paternal Uncle   . Diabetes Paternal Uncle   . Hypertension Paternal Uncle     History  Substance Use Topics  . Smoking status: Never Smoker   . Smokeless tobacco: Never Used  . Alcohol Use: No    Allergies:  Allergies  Allergen Reactions  . Lorabid [Loracarbef] Hives and Swelling  . Latex Itching and Rash  . Orange Fruit [Citrus] Rash    Prescriptions prior to admission  Medication Sig Dispense Refill  .  acetaminophen (TYLENOL) 500 MG tablet Take 500 mg by mouth every 6 (six) hours as needed for headache.      . butalbital-acetaminophen-caffeine (FIORICET) 50-325-40 MG per tablet Take 1 tablet by mouth every 4 (four) hours as needed for headache.  20 tablet  0  . diphenhydrAMINE (BENADRYL) 25 MG tablet Take 25 mg by mouth at bedtime as needed for allergies.      . Iron-FA-B Cmp-C-Biot-Probiotic (FUSION PLUS) CAPS Take 1 capsule by mouth See admin instructions. 1 time daily between meals  30 capsule  6  . pantoprazole (PROTONIX) 20 MG tablet Take 1 tablet (20 mg total) by mouth daily.  30 tablet  0  . Prenatal Vit-Min-FA-Fish Oil ([employer] PRENATAL GUMMY PO) Take 2 each by mouth daily.         Review of Systems  All other systems reviewed and are negative.  Physical Exam   Blood pressure 150/83, pulse 118, temperature 98.3 F (36.8 C), temperature source Oral, resp. rate 18, last menstrual period 07/18/2013.  Physical Exam  Nursing note and vitals reviewed. Constitutional: She is oriented to person, place, and time. She appears well-developed and well-nourished. She appears distressed.  HENT:  Head: Normocephalic and atraumatic.  Eyes: Pupils are equal, round, and reactive to light.  Neck: Normal range of motion.  Cardiovascular: Normal rate  and regular rhythm.   No murmur heard. Respiratory: Effort normal and breath sounds normal. No respiratory distress. She has no wheezes.  GI: There is no tenderness.  Fundus size appropriate for gestational age  Musculoskeletal: Normal range of motion.  Neurological: She is alert and oriented to person, place, and time. She displays normal reflexes.  Skin: Skin is warm and dry.   FHT:  FHR: 135 bpm, variability: moderate,  accelerations:  present,  decelerations:  none  Results for orders placed during the hospital encounter of 09/07/14 (from the past 24 hour(s))  PROTEIN / CREATININE RATIO, URINE     Status: None   Collection Time    09/07/14   6:00 PM      Result Value Ref Range   Creatinine, Urine 136.47     Total Protein, Urine 16.9     Protein Creatinine Ratio 0.12  0.00 - 0.15  CBC     Status: Abnormal   Collection Time    09/07/14  6:05 PM      Result Value Ref Range   WBC 9.5  4.0 - 10.5 K/uL   RBC 4.63  3.87 - 5.11 MIL/uL   Hemoglobin 9.7 (*) 12.0 - 15.0 g/dL   HCT 32.0 (*) 36.0 - 46.0 %   MCV 69.1 (*) 78.0 - 100.0 fL   MCH 21.0 (*) 26.0 - 34.0 pg   MCHC 30.3  30.0 - 36.0 g/dL   RDW 20.0 (*) 11.5 - 15.5 %   Platelets 271  150 - 400 K/uL  COMPREHENSIVE METABOLIC PANEL     Status: Abnormal   Collection Time    09/07/14  6:05 PM      Result Value Ref Range   Sodium 135 (*) 137 - 147 mEq/L   Potassium 3.4 (*) 3.7 - 5.3 mEq/L   Chloride 102  96 - 112 mEq/L   CO2 23  19 - 32 mEq/L   Glucose, Bld 100 (*) 70 - 99 mg/dL   BUN 4 (*) 6 - 23 mg/dL   Creatinine, Ser 0.66  0.50 - 1.10 mg/dL   Calcium 8.6  8.4 - 10.5 mg/dL   Total Protein 6.6  6.0 - 8.3 g/dL   Albumin 2.7 (*) 3.5 - 5.2 g/dL   AST 14  0 - 37 U/L   ALT 10  0 - 35 U/L   Alkaline Phosphatase 139 (*) 39 - 117 U/L   Total Bilirubin 0.4  0.3 - 1.2 mg/dL   GFR calc non Af Amer >90  >90 mL/min   GFR calc Af Amer >90  >90 mL/min   Anion gap 10  5 - 15    Procedures: None  MDM: 6:00PM Presentation concerning for preecclampsia vs gestational hypertension. Will obtain CMP, CBC, UPC. Also will give bolus of LR, and one dose morphine and reglan.  7:50PM Labs within normal limits, headache improved, BP to 116/68.   Assessment and Plan  A: [redacted]w[redacted]d IUP, headache during pregnancy, resolved with IV fluids, morphine, and reglan. FHT: category 1. Labs normal.   P: Expectant management Labor precautions and kick counts reviewed   Dimas Chyle 09/07/2014, 6:03 PM   OB fellow attestation:  I have seen and examined this patient; I agree with above documentation in the resident's note.   Melissa Livingston is a 18 y.o. G1P0 reporting fatigue.  Incidentally she was found  to have elevated blood pressure and reported a headache.  +FM, denies LOF, VB, contractions, vaginal discharge.  PE: BP 128/78  Pulse 94  Temp(Src) 98.3 F (36.8 C) (Oral)  Resp 18  LMP 07/18/2013 Gen: calm comfortable, NAD Resp: normal effort, no distress Abd: gravid  ROS, labs, PMH reviewed NST reactive  Plan:  - no hx of gHTN, labs normal, headache resolved suspect 2/2 dehydration as tachycardia also improved.  Also received morphine and reglan for possible migraine. - fetal kick counts reinforced, preterm labor precautions - continue routine follow up in OB clinic  Merla Riches, MD 11:43 PM

## 2014-09-07 NOTE — MAU Note (Signed)
Pt states she has had severe pelvic pressure & uc's since midnight, denies bleeding or LOF.  Was 1 cm on Monday.

## 2014-09-07 NOTE — Discharge Instructions (Signed)
Third Trimester of Pregnancy The third trimester is from week 29 through week 42, months 7 through 9. This trimester is when your unborn baby (fetus) is growing very fast. At the end of the ninth month, the unborn baby is about 20 inches in length. It weighs about 6-10 pounds.  HOME CARE   Avoid all smoking, herbs, and alcohol. Avoid drugs not approved by your doctor.  Only take medicine as told by your doctor. Some medicines are safe and some are not during pregnancy.  Exercise only as told by your doctor. Stop exercising if you start having cramps.  Eat regular, healthy meals.  Wear a good support bra if your breasts are tender.  Do not use hot tubs, steam rooms, or saunas.  Wear your seat belt when driving.  Avoid raw meat, uncooked cheese, and liter boxes and soil used by cats.  Take your prenatal vitamins.  Try taking medicine that helps you poop (stool softener) as needed, and if your doctor approves. Eat more fiber by eating fresh fruit, vegetables, and whole grains. Drink enough fluids to keep your pee (urine) clear or pale yellow.  Take warm water baths (sitz baths) to soothe pain or discomfort caused by hemorrhoids. Use hemorrhoid cream if your doctor approves.  If you have puffy, bulging veins (varicose veins), wear support hose. Raise (elevate) your feet for 15 minutes, 3-4 times a day. Limit salt in your diet.  Avoid heavy lifting, wear low heels, and sit up straight.  Rest with your legs raised if you have leg cramps or low back pain.  Visit your dentist if you have not gone during your pregnancy. Use a soft toothbrush to brush your teeth. Be gentle when you floss.  You can have sex (intercourse) unless your doctor tells you not to.  Do not travel far distances unless you must. Only do so with your doctor's approval.  Take prenatal classes.  Practice driving to the hospital.  Pack your hospital bag.  Prepare the baby's room.  Go to your doctor visits. GET  HELP IF:  You are not sure if you are in labor or if your water has broken.  You are dizzy.  You have mild cramps or pressure in your lower belly (abdominal).  You have a nagging pain in your belly area.  You continue to feel sick to your stomach (nauseous), throw up (vomit), or have watery poop (diarrhea).  You have bad smelling fluid coming from your vagina.  You have pain with peeing (urination). GET HELP RIGHT AWAY IF:   You have a fever.  You are leaking fluid from your vagina.  You are spotting or bleeding from your vagina.  You have severe belly cramping or pain.  You lose or gain weight rapidly.  You have trouble catching your breath and have chest pain.  You notice sudden or extreme puffiness (swelling) of your face, hands, ankles, feet, or legs.  You have not felt the baby move in over an hour.  You have severe headaches that do not go away with medicine.  You have vision changes. Document Released: 02/17/2010 Document Revised: 03/20/2013 Document Reviewed: 01/24/2013 [employer] Patient Information 2015 [employer]. This information is not intended to replace advice given to you by your health care provider. Make sure you discuss any questions you have with your health care provider.  

## 2014-09-10 ENCOUNTER — Encounter: Payer: Self-pay | Admitting: Obstetrics and Gynecology

## 2014-09-10 ENCOUNTER — Ambulatory Visit (INDEPENDENT_AMBULATORY_CARE_PROVIDER_SITE_OTHER): Payer: Medicaid Other | Admitting: Obstetrics and Gynecology

## 2014-09-10 VITALS — BP 130/90 | Wt 188.0 lb

## 2014-09-10 DIAGNOSIS — Z331 Pregnant state, incidental: Secondary | ICD-10-CM

## 2014-09-10 DIAGNOSIS — Z118 Encounter for screening for other infectious and parasitic diseases: Secondary | ICD-10-CM

## 2014-09-10 DIAGNOSIS — Z1159 Encounter for screening for other viral diseases: Secondary | ICD-10-CM

## 2014-09-10 DIAGNOSIS — Z3403 Encounter for supervision of normal first pregnancy, third trimester: Secondary | ICD-10-CM

## 2014-09-10 DIAGNOSIS — Z3685 Encounter for antenatal screening for Streptococcus B: Secondary | ICD-10-CM

## 2014-09-10 DIAGNOSIS — Z1389 Encounter for screening for other disorder: Secondary | ICD-10-CM

## 2014-09-10 LAB — POCT URINALYSIS DIPSTICK
Glucose, UA: NEGATIVE
Ketones, UA: NEGATIVE
LEUKOCYTES UA: NEGATIVE
Nitrite, UA: NEGATIVE
RBC UA: NEGATIVE

## 2014-09-10 LAB — OB RESULTS CONSOLE GC/CHLAMYDIA
CHLAMYDIA, DNA PROBE: NEGATIVE
GC PROBE AMP, GENITAL: NEGATIVE

## 2014-09-10 NOTE — Progress Notes (Signed)
Pt states that she has been having contractions every 6-10 all through the night. Pt states that she is having a lot of pressure down there as well.

## 2014-09-10 NOTE — Progress Notes (Addendum)
Patient ID: Melissa Livingston, female   DOB: June 10, 1996, 18 y.o.   MRN: [id number] G1P0 [redacted]w[redacted]d Estimated Date of Delivery: 09/30/14  Blood pressure 130/90, weight 188 lb (85.276 kg), last menstrual period 07/18/2013.   refer to the ob flow sheet for FH and FHR, also BP, Wt, Urine results:negative Seen at [facility] last 10/2 and negative w/u for PRE-E with Pr/Cr 0.12. Patient reports +good fetal movement, denies any bleeding and no rupture of membranes symptoms or regular contractions. Patient complaints: Pt is complaining of insomnia due to cramping and vaginal pressure and recurrent headaches.  She is also complaining of back pain.    Physical Exam: Cervix - soft, long, -2, vertex, 1 cm dilated FH - 36cm FHR -  152   Assessment: [redacted]w[redacted]d, G1P0  Plan:  Continued routine obstetrical care F/u in 1 week   This chart was scribed for Jonnie Kind, MD by Donato Schultz, ED Scribe. This patient was seen in room 1 and the patient's care was started at 2:06 PM.  Reviewed by Angelyn Punt

## 2014-09-11 LAB — GC/CHLAMYDIA PROBE AMP
CT Probe RNA: NEGATIVE
GC Probe RNA: NEGATIVE

## 2014-09-12 LAB — STREP B DNA PROBE: STREP GROUP B AG: NOT DETECTED

## 2014-09-17 ENCOUNTER — Encounter: Payer: Medicaid Other | Admitting: Women's Health

## 2014-09-17 ENCOUNTER — Encounter: Payer: Self-pay | Admitting: Obstetrics and Gynecology

## 2014-09-19 ENCOUNTER — Encounter: Payer: Self-pay | Admitting: Obstetrics and Gynecology

## 2014-09-19 ENCOUNTER — Encounter: Payer: Medicaid Other | Admitting: Obstetrics and Gynecology

## 2014-09-19 ENCOUNTER — Ambulatory Visit (INDEPENDENT_AMBULATORY_CARE_PROVIDER_SITE_OTHER): Payer: Medicaid Other | Admitting: Obstetrics and Gynecology

## 2014-09-19 VITALS — BP 136/80 | Wt 191.5 lb

## 2014-09-19 DIAGNOSIS — Z1389 Encounter for screening for other disorder: Secondary | ICD-10-CM

## 2014-09-19 DIAGNOSIS — Z331 Pregnant state, incidental: Secondary | ICD-10-CM

## 2014-09-19 DIAGNOSIS — Z3483 Encounter for supervision of other normal pregnancy, third trimester: Secondary | ICD-10-CM

## 2014-09-19 LAB — POCT URINALYSIS DIPSTICK
Blood, UA: NEGATIVE
GLUCOSE UA: NEGATIVE
KETONES UA: NEGATIVE
NITRITE UA: NEGATIVE
Protein, UA: NEGATIVE

## 2014-09-19 NOTE — Progress Notes (Signed)
G1P0 [redacted]w[redacted]d Estimated Date of Delivery: 09/30/14  Blood pressure 136/80, weight 191 lb 8 oz (86.864 kg), last menstrual period 07/18/2013.  refer to the ob flow sheet for FH and FHR, also BP, Wt, Urine results:notable for moderate 2+ leukocytes, otherwise negative.  Patient reports good fetal movement, denies any bleeding and no rupture of membranes symptoms or regular contractions. Patient complaints: states that she has migraines for about two months now. She denies any other complaints at this time.  Physical Examination: BP 136/80   Wt 191 lb 8 oz (86.864 kg)   LMP 07/18/2013  Reflexes are 2+ to 3+ LE and 1+ Upper Extremities. General appearance - alert, well appearing, and in no distress Cervix: not dilated, long closed post -2 Fetal Heart Rate: 128  Fundal Height: 38 cm Edema: negative  Questions were answered. Plan:  Continued routine obstetrical care, and blood pressure recheck  F/u in 1 weeks for routine OB  This chart was scribed for Jonnie Kind, MD by Steva Colder, ED Scribe. The patient was seen in room 1 at 12:06 PM.

## 2014-09-19 NOTE — Progress Notes (Signed)
Pt states that she is having diarrhea, nausea, and vomiting since Saturday night. Pt states that she has had a headache for about 2 months, pt states that it is constant and never really goes away. Pt states that she has occasional blurred vision and dizziness.

## 2014-09-21 ENCOUNTER — Other Ambulatory Visit: Payer: Self-pay

## 2014-09-24 ENCOUNTER — Ambulatory Visit (INDEPENDENT_AMBULATORY_CARE_PROVIDER_SITE_OTHER): Payer: Medicaid Other | Admitting: Women's Health

## 2014-09-24 ENCOUNTER — Encounter: Payer: Self-pay | Admitting: Women's Health

## 2014-09-24 VITALS — BP 120/80 | Wt 193.0 lb

## 2014-09-24 DIAGNOSIS — Z23 Encounter for immunization: Secondary | ICD-10-CM

## 2014-09-24 DIAGNOSIS — A084 Viral intestinal infection, unspecified: Secondary | ICD-10-CM

## 2014-09-24 DIAGNOSIS — Z1389 Encounter for screening for other disorder: Secondary | ICD-10-CM

## 2014-09-24 DIAGNOSIS — Z3403 Encounter for supervision of normal first pregnancy, third trimester: Secondary | ICD-10-CM

## 2014-09-24 DIAGNOSIS — Z331 Pregnant state, incidental: Secondary | ICD-10-CM

## 2014-09-24 DIAGNOSIS — O368131 Decreased fetal movements, third trimester, fetus 1: Secondary | ICD-10-CM

## 2014-09-24 LAB — POCT URINALYSIS DIPSTICK
GLUCOSE UA: NEGATIVE
Ketones, UA: NEGATIVE
Leukocytes, UA: NEGATIVE
Nitrite, UA: NEGATIVE
RBC UA: NEGATIVE

## 2014-09-24 MED ORDER — PROMETHAZINE HCL 12.5 MG PO TABS: 12.5000 mg | ORAL_TABLET | Freq: Four times a day (QID) | ORAL | Status: DC | PRN

## 2014-09-24 NOTE — Progress Notes (Signed)
Work-in Low-risk OB appointment G1P0 [redacted]w[redacted]d Estimated Date of Delivery: 09/30/14 BP 120/80  Wt 193 lb (87.544 kg)  LMP 07/18/2013  BP, weight, and urine reviewed.  Refer to obstetrical flow sheet for FH & FHR.  Reports decreased fm.  Denies regular uc's, lof, vb, or uti s/s. N/V/D this weekend, diarrhea x 2 yesterday, none today, vomited maybe 2-3x. Reports bp at home sat night 150/70s.  BP normal here today. Moist mucous membranes, no skin tenting, no ketonuria. Will rx phenergan, to increase fluids when able.  SVE per request: 1.5/50/-3, vtx, posterior Reactive NST, uc's q 2-5 w/o cervical change- likely d/t mild dehydration w/ n/v.  Reviewed labor s/s, fkc, pre-e s/s. Plan:  Continue routine obstetrical care  F/U as scheduled for OB appointment

## 2014-09-25 ENCOUNTER — Encounter (HOSPITAL_COMMUNITY): Payer: Self-pay | Admitting: Emergency Medicine

## 2014-09-25 ENCOUNTER — Inpatient Hospital Stay (HOSPITAL_COMMUNITY)
Admission: AD | Admit: 2014-09-25 | Discharge: 2014-09-25 | Disposition: A | Discharge status: Other Acute Inpatient Hospital | Payer: 59 | Source: Ambulatory Visit | Attending: Emergency Medicine | Admitting: Emergency Medicine

## 2014-09-25 DIAGNOSIS — Z833 Family history of diabetes mellitus: Secondary | ICD-10-CM

## 2014-09-25 DIAGNOSIS — R110 Nausea: Secondary | ICD-10-CM

## 2014-09-25 DIAGNOSIS — O133 Gestational [pregnancy-induced] hypertension without significant proteinuria, third trimester: Secondary | ICD-10-CM | POA: Diagnosis not present

## 2014-09-25 DIAGNOSIS — R000 Tachycardia, unspecified: Secondary | ICD-10-CM | POA: Insufficient documentation

## 2014-09-25 DIAGNOSIS — Z3493 Encounter for supervision of normal pregnancy, unspecified, third trimester: Secondary | ICD-10-CM

## 2014-09-25 DIAGNOSIS — O99613 Diseases of the digestive system complicating pregnancy, third trimester: Secondary | ICD-10-CM | POA: Diagnosis present

## 2014-09-25 DIAGNOSIS — Z3A00 Weeks of gestation of pregnancy not specified: Secondary | ICD-10-CM | POA: Insufficient documentation

## 2014-09-25 DIAGNOSIS — J45909 Unspecified asthma, uncomplicated: Secondary | ICD-10-CM | POA: Insufficient documentation

## 2014-09-25 DIAGNOSIS — K219 Gastro-esophageal reflux disease without esophagitis: Secondary | ICD-10-CM | POA: Diagnosis present

## 2014-09-25 DIAGNOSIS — Z79899 Other long term (current) drug therapy: Secondary | ICD-10-CM | POA: Insufficient documentation

## 2014-09-25 DIAGNOSIS — Z91040 Latex allergy status: Secondary | ICD-10-CM

## 2014-09-25 DIAGNOSIS — Z3A39 39 weeks gestation of pregnancy: Secondary | ICD-10-CM | POA: Diagnosis present

## 2014-09-25 DIAGNOSIS — O26893 Other specified pregnancy related conditions, third trimester: Secondary | ICD-10-CM

## 2014-09-25 DIAGNOSIS — Z8249 Family history of ischemic heart disease and other diseases of the circulatory system: Secondary | ICD-10-CM

## 2014-09-25 MED ORDER — SODIUM CHLORIDE 0.9 % IV BOLUS (SEPSIS)
1000.0000 mL | Freq: Once | INTRAVENOUS | Status: AC
  Administered 2014-09-25: 1000 mL via INTRAVENOUS

## 2014-09-25 NOTE — ED Notes (Addendum)
Pt thinks she is in labor. No bleeding of d/c, alert.  Says she has been feeling baby move.    Due date 10/25   Dr Glo Herring

## 2014-09-25 NOTE — ED Notes (Signed)
RN notified Junie Panning, Rapid Response Nurse at [facility].

## 2014-09-25 NOTE — Progress Notes (Signed)
OB RR RN notified that patient is being transferred to MAU via [place].

## 2014-09-25 NOTE — Progress Notes (Signed)
OB RR RN notified that patient had previously been placed on EFM.  OB RR RN now placing patient in [facility] fetal monitoring system.

## 2014-09-25 NOTE — ED Provider Notes (Signed)
CSN: [id number]     Arrival date & time 09/25/14  1551 History   First MD Initiated Contact with Patient 09/25/14 1609     No chief complaint on file.    (Consider location/radiation/quality/duration/timing/severity/associated sxs/prior Treatment) The history is provided by the patient.   patient is complaining of possible labor.   patient is due in 5 days. She is G1 P1. Seen at gynecologist office yesterday. Reportedly was 1.5 cm dilated and contracting every 2-3 minutes. She states she's continued to contract that frequently but more severely now. No rush of fluid. She still feels really moving. She states the pain is getting more severe so she came to the ER. Mother states that she panicked thinking that maybe was coming and went to an independent [facility]. No fevers. Mild nausea. She's had some diarrhea. Past Medical History  Diagnosis Date  . Asthma    Past Surgical History  Procedure Laterality Date  . None    . Esophagogastroduodenoscopy (egd) with esophageal dilation N/A 07/19/2013    Procedure: ESOPHAGOGASTRODUODENOSCOPY (EGD) WITH ESOPHAGEAL DILATION;  Surgeon: Daneil Dolin, MD;  [facility];  Service: Endoscopy;  Laterality: N/A;  2:45   Family History  Problem Relation Age of Onset  . Multiple sclerosis Mother   . Colon cancer Other     maternal great uncle, age greater than 36  . Heart disease Maternal Grandfather   . Heart disease Paternal Uncle   . Diabetes Paternal Uncle   . Hypertension Paternal Uncle    History  Substance Use Topics  . Smoking status: Never Smoker   . Smokeless tobacco: Never Used  . Alcohol Use: No   OB History   Grav Para Term Preterm Abortions TAB SAB Ect Mult Living   1              Review of Systems  Constitutional: Positive for fatigue. Negative for fever.  Respiratory: Negative for shortness of breath.   Gastrointestinal: Positive for nausea and abdominal pain.  Genitourinary:       Abdominal  cramping without rush of fluid.  Skin: Negative for wound.  Neurological: Negative for weakness.      Allergies  Lorabid; Latex; and Orange fruit  Home Medications   Prior to Admission medications   Medication Sig Start Date End Date Taking? Authorizing Provider  acetaminophen (TYLENOL) 500 MG tablet Take 500 mg by mouth every 6 (six) hours as needed for headache.   Yes Historical Provider, MD  butalbital-acetaminophen-caffeine (FIORICET) [phone] MG per tablet Take 1 tablet by mouth every 4 (four) hours as needed for headache. 08/27/14 08/27/15 Yes Tawnya Crook, CNM  Iron-FA-B Cmp-C-Biot-Probiotic (FUSION PLUS) CAPS Take 1 capsule by mouth See admin instructions. 1 time daily between meals 07/10/14  Yes Tawnya Crook, CNM  pantoprazole (PROTONIX) 20 MG tablet Take 1 tablet (20 mg total) by mouth daily. 08/27/14  Yes Tawnya Crook, CNM  Prenatal Vit-Min-FA-Fish Oil ([employer] PRENATAL GUMMY PO) Take 2 each by mouth daily.    Yes Historical Provider, MD  promethazine (PHENERGAN) 12.5 MG tablet Take 1-2 tablets (12.5-25 mg total) by mouth every 6 (six) hours as needed for nausea or vomiting. 09/24/14   Tawnya Crook, CNM   BP 138/89  Pulse 122  Temp(Src) 97.9 F (36.6 C) (Oral)  Resp 20  Ht 5\' 1"  (1.549 m)  Wt 193 lb (87.544 kg)  BMI 36.49 kg/m2  SpO2 100%  LMP 07/18/2013 Physical Exam  Nursing note  and vitals reviewed. Constitutional: She is oriented to person, place, and time. She appears well-developed and well-nourished.  Patient appears somewhat uncomfortable  HENT:  Head: Normocephalic and atraumatic.  Eyes: EOM are normal. Pupils are equal, round, and reactive to light.  Neck: Normal range of motion. Neck supple.  Cardiovascular: Regular rhythm and normal heart sounds.   No murmur heard. Mild tachycardia  Pulmonary/Chest: Effort normal and breath sounds normal. No respiratory distress. She has no wheezes. She has no rales.  Abdominal:  Soft. Bowel sounds are normal. She exhibits mass. She exhibits no distension. There is no tenderness. There is no rebound and no guarding.  Patient is gravida 2 well above abdomen.  Genitourinary:  Sterile vaginal exam showed dilatation of approximately 2 cm. She is still very posterior.  Musculoskeletal: Normal range of motion.  Neurological: She is alert and oriented to person, place, and time. No cranial nerve deficit.  Skin: Skin is warm and dry.  Psychiatric: She has a normal mood and affect. Her speech is normal.    ED Course  Procedures (including critical care time) Labs Review Labs Reviewed - No data to display  Imaging Review No results found.   EKG Interpretation None      MDM   Final diagnoses:  Pregnant and not yet delivered in third trimester    Patient is concerned she may be in labor. Initial unable to use toco due to that room being used for CPR. Fetal heart tones are spot checked at 150. Still not very dilated and rather posterior/high. Discussed with Dr. Elonda Husky, who recommended transfer to [facility] hospital.    Jasper Riling. Alvino Chapel, MD 09/25/14 1701

## 2014-09-25 NOTE — Discharge Instructions (Signed)

## 2014-09-25 NOTE — MAU Note (Signed)
Patient states she was brought over from [facility] in labor.

## 2014-09-25 NOTE — Progress Notes (Signed)
[place]  Notified of this 18 yo G1 P0 @ 39.[redacted] wks GA in with complaint of abdominal pain.  Per ED RN patient was 2 cms dilated at recent OB appt and is now in the process of being transferred to MAU via [place].

## 2014-09-26 ENCOUNTER — Ambulatory Visit (INDEPENDENT_AMBULATORY_CARE_PROVIDER_SITE_OTHER): Payer: Medicaid Other | Admitting: Obstetrics and Gynecology

## 2014-09-26 ENCOUNTER — Encounter: Payer: Self-pay | Admitting: Obstetrics and Gynecology

## 2014-09-26 VITALS — BP 140/92 | Wt 194.0 lb

## 2014-09-26 DIAGNOSIS — Z1389 Encounter for screening for other disorder: Secondary | ICD-10-CM

## 2014-09-26 DIAGNOSIS — Z3483 Encounter for supervision of other normal pregnancy, third trimester: Secondary | ICD-10-CM

## 2014-09-26 DIAGNOSIS — Z331 Pregnant state, incidental: Secondary | ICD-10-CM

## 2014-09-26 LAB — POCT URINALYSIS DIPSTICK
Blood, UA: NEGATIVE
GLUCOSE UA: NEGATIVE
Ketones, UA: NEGATIVE
LEUKOCYTES UA: NEGATIVE
Nitrite, UA: NEGATIVE
PROTEIN UA: NEGATIVE

## 2014-09-26 NOTE — Progress Notes (Signed)
G1P0 [redacted]w[redacted]d Estimated Date of Delivery: 09/30/14  Blood pressure 140/92, weight 194 lb (87.998 kg), last menstrual period 07/18/2013.  refer to the ob flow sheet for FH and FHR, also BP, Wt, Urine results:notable for negative  Patient reports good fetal movement, denies any bleeding and no rupture of membranes symptoms or reg pular contractions. Patient complaints: she states that she is having lots of pain and pressure. She states that she is having constant HA with the last trimester and dizziness. She states that when she went to [facility] last night she was informed that she was dilated 2.5 cm.  Fetal Heart Rate:  Fundal Height:  Cervix is thinned, 2 cm, long,  Reflexes are 3+ LE and 1+ Upper Extremities Pt bp is 138/84 in office on recheck by jvf Questions were answered. Plan:  Continued routine obstetrical care,   F/u in 5 days for prenatal visit. Pt advised to be rechecked prn for H/a ruq pain, increased malaise,  This chart was scribed for Jonnie Kind, MD by Steva Colder, ED Scribe. The patient was seen in room 1 at 4:17 PM.

## 2014-09-26 NOTE — Progress Notes (Signed)
Pt states that she is having lots of pain and pressure. Pt states that she is having headaches and dizziness.

## 2014-09-27 ENCOUNTER — Encounter: Payer: Self-pay | Admitting: Advanced Practice Midwife

## 2014-09-27 ENCOUNTER — Telehealth: Payer: Self-pay | Admitting: *Deleted

## 2014-09-27 ENCOUNTER — Encounter (HOSPITAL_COMMUNITY): Payer: Self-pay

## 2014-09-27 ENCOUNTER — Other Ambulatory Visit: Payer: Medicaid Other

## 2014-09-27 ENCOUNTER — Inpatient Hospital Stay (HOSPITAL_COMMUNITY)
Admission: AD | Admit: 2014-09-27 | Discharge: 2014-10-01 | DRG: 766 | Disposition: A | Discharge status: Home / Self Care | Payer: 59 | Source: Ambulatory Visit | Attending: Obstetrics and Gynecology | Admitting: Obstetrics and Gynecology

## 2014-09-27 ENCOUNTER — Ambulatory Visit (INDEPENDENT_AMBULATORY_CARE_PROVIDER_SITE_OTHER): Payer: Medicaid Other | Admitting: Advanced Practice Midwife

## 2014-09-27 VITALS — BP 150/86

## 2014-09-27 DIAGNOSIS — O133 Gestational [pregnancy-induced] hypertension without significant proteinuria, third trimester: Secondary | ICD-10-CM

## 2014-09-27 DIAGNOSIS — IMO0001 Reserved for inherently not codable concepts without codable children: Secondary | ICD-10-CM

## 2014-09-27 DIAGNOSIS — K219 Gastro-esophageal reflux disease without esophagitis: Secondary | ICD-10-CM | POA: Diagnosis present

## 2014-09-27 DIAGNOSIS — Z3A39 39 weeks gestation of pregnancy: Secondary | ICD-10-CM | POA: Diagnosis present

## 2014-09-27 DIAGNOSIS — O99613 Diseases of the digestive system complicating pregnancy, third trimester: Secondary | ICD-10-CM | POA: Diagnosis present

## 2014-09-27 DIAGNOSIS — R030 Elevated blood-pressure reading, without diagnosis of hypertension: Principal | ICD-10-CM

## 2014-09-27 DIAGNOSIS — Z91040 Latex allergy status: Secondary | ICD-10-CM | POA: Diagnosis not present

## 2014-09-27 DIAGNOSIS — Z833 Family history of diabetes mellitus: Secondary | ICD-10-CM | POA: Diagnosis not present

## 2014-09-27 DIAGNOSIS — Z8249 Family history of ischemic heart disease and other diseases of the circulatory system: Secondary | ICD-10-CM | POA: Diagnosis not present

## 2014-09-27 LAB — CBC
HCT: 30 % — ABNORMAL LOW (ref 36.0–46.0)
HEMATOCRIT: 30.1 % — AB (ref 36.0–46.0)
Hemoglobin: 9.1 g/dL — ABNORMAL LOW (ref 12.0–15.0)
Hemoglobin: 9.2 g/dL — ABNORMAL LOW (ref 12.0–15.0)
MCH: 19.9 pg — ABNORMAL LOW (ref 26.0–34.0)
MCH: 20.7 pg — ABNORMAL LOW (ref 26.0–34.0)
MCHC: 30.3 g/dL (ref 30.0–36.0)
MCHC: 30.6 g/dL (ref 30.0–36.0)
MCV: 65.5 fL — ABNORMAL LOW (ref 78.0–100.0)
MCV: 67.8 fL — ABNORMAL LOW (ref 78.0–100.0)
PLATELETS: 223 10*3/uL (ref 150–400)
Platelets: 223 10*3/uL (ref 150–400)
RBC: 4.58 MIL/uL (ref 3.87–5.11)
RBC: 4.44 MIL/uL (ref 3.87–5.11)
RDW: 20.4 % — ABNORMAL HIGH (ref 11.5–15.5)
RDW: 19.5 % — AB (ref 11.5–15.5)
WBC: 7.3 10*3/uL (ref 4.0–10.5)
WBC: 9.1 10*3/uL (ref 4.0–10.5)

## 2014-09-27 LAB — COMPREHENSIVE METABOLIC PANEL
ALBUMIN: 3.1 g/dL — AB (ref 3.5–5.2)
ALBUMIN: 2.8 g/dL — AB (ref 3.5–5.2)
ALT: 8 U/L (ref 0–35)
ALT: 8 U/L (ref 0–35)
AST: 12 U/L (ref 0–37)
AST: 15 U/L (ref 0–37)
Alkaline Phosphatase: 155 U/L — ABNORMAL HIGH (ref 39–117)
Alkaline Phosphatase: 176 U/L — ABNORMAL HIGH (ref 39–117)
Anion gap: 13 (ref 5–15)
BUN: 3 mg/dL — ABNORMAL LOW (ref 6–23)
BUN: 7 mg/dL (ref 6–23)
CALCIUM: 8 mg/dL — AB (ref 8.4–10.5)
CHLORIDE: 104 meq/L (ref 96–112)
CO2: 22 mEq/L (ref 19–32)
CO2: 22 mEq/L (ref 19–32)
Calcium: 8.9 mg/dL (ref 8.4–10.5)
Chloride: 101 mEq/L (ref 96–112)
Creat: 0.67 mg/dL (ref 0.50–1.10)
Creatinine, Ser: 0.69 mg/dL (ref 0.50–1.10)
GFR calc Af Amer: >90 mL/min (ref >90)
GFR calc non Af Amer: >90 mL/min (ref >90)
Glucose, Bld: 82 mg/dL (ref 70–99)
Glucose, Bld: 90 mg/dL (ref 70–99)
POTASSIUM: 4.4 meq/L (ref 3.7–5.3)
Potassium: 3.9 mEq/L (ref 3.5–5.3)
Sodium: 135 mEq/L (ref 135–145)
Sodium: 136 mEq/L — ABNORMAL LOW (ref 137–147)
TOTAL PROTEIN: 5.9 g/dL — AB (ref 6.0–8.3)
Total Bilirubin: 0.6 mg/dL (ref 0.2–1.2)
Total Bilirubin: 0.5 mg/dL (ref 0.3–1.2)
Total Protein: 6.6 g/dL (ref 6.0–8.3)

## 2014-09-27 LAB — PROTEIN / CREATININE RATIO, URINE
Creatinine, Urine: 43.01 mg/dL
Protein Creatinine Ratio: 0.11 (ref 0.00–0.15)
Total Protein, Urine: 4.9 mg/dL

## 2014-09-27 LAB — TYPE AND SCREEN
ABO/RH(D): O POS
ANTIBODY SCREEN: NEGATIVE

## 2014-09-27 MED ORDER — LIDOCAINE HCL (PF) 1 % IJ SOLN
30.0000 mL | INTRAMUSCULAR | Status: DC | PRN
  Filled 2014-09-27: qty 30

## 2014-09-27 MED ORDER — OXYCODONE-ACETAMINOPHEN 5-325 MG PO TABS: 2.0000 | ORAL_TABLET | ORAL | Status: DC | PRN

## 2014-09-27 MED ORDER — OXYTOCIN 40 UNITS IN LACTATED RINGERS INFUSION - SIMPLE MED: 62.5000 mL/h | INTRAVENOUS | Status: DC

## 2014-09-27 MED ORDER — ZOLPIDEM TARTRATE 5 MG PO TABS
5.0000 mg | ORAL_TABLET | Freq: Every evening | ORAL | Status: DC | PRN
  Administered 2014-09-28: 5 mg via ORAL
  Filled 2014-09-27: qty 1

## 2014-09-27 MED ORDER — FLEET ENEMA 7-19 GM/118ML RE ENEM: 1.0000 | ENEMA | RECTAL | Status: DC | PRN

## 2014-09-27 MED ORDER — ACETAMINOPHEN 325 MG PO TABS
650.0000 mg | ORAL_TABLET | ORAL | Status: DC | PRN
  Administered 2014-09-27 – 2014-09-28 (×2): 650 mg via ORAL
  Filled 2014-09-27 (×2): qty 2

## 2014-09-27 MED ORDER — OXYTOCIN 40 UNITS IN LACTATED RINGERS INFUSION - SIMPLE MED
1.0000 m[IU]/min | INTRAVENOUS | Status: DC
  Administered 2014-09-28: 10 m[IU]/min via INTRAVENOUS
  Administered 2014-09-28: 2 m[IU]/min via INTRAVENOUS
  Filled 2014-09-27: qty 1000

## 2014-09-27 MED ORDER — LACTATED RINGERS IV SOLN
INTRAVENOUS | Status: DC
  Administered 2014-09-27 – 2014-09-28 (×5): via INTRAVENOUS

## 2014-09-27 MED ORDER — LACTATED RINGERS IV SOLN: 500.0000 mL | INTRAVENOUS | Status: DC | PRN

## 2014-09-27 MED ORDER — ONDANSETRON HCL 4 MG/2ML IJ SOLN: 4.0000 mg | Freq: Four times a day (QID) | INTRAMUSCULAR | Status: DC | PRN

## 2014-09-27 MED ORDER — FENTANYL CITRATE 0.05 MG/ML IJ SOLN
50.0000 ug | INTRAMUSCULAR | Status: DC | PRN
  Administered 2014-09-28: 50 ug via INTRAVENOUS
  Filled 2014-09-27: qty 2

## 2014-09-27 MED ORDER — TERBUTALINE SULFATE 1 MG/ML IJ SOLN: 0.2500 mg | Freq: Once | INTRAMUSCULAR | Status: AC | PRN

## 2014-09-27 MED ORDER — OXYCODONE-ACETAMINOPHEN 5-325 MG PO TABS: 1.0000 | ORAL_TABLET | ORAL | Status: DC | PRN

## 2014-09-27 MED ORDER — MISOPROSTOL 200 MCG PO TABS: 50.0000 ug | ORAL_TABLET | Freq: Once | ORAL | Status: DC

## 2014-09-27 MED ORDER — CITRIC ACID-SODIUM CITRATE 334-500 MG/5ML PO SOLN
30.0000 mL | ORAL | Status: DC | PRN
  Administered 2014-09-29: 30 mL via ORAL
  Filled 2014-09-27: qty 15

## 2014-09-27 MED ORDER — OXYTOCIN BOLUS FROM INFUSION: 500.0000 mL | INTRAVENOUS | Status: DC

## 2014-09-27 NOTE — Progress Notes (Signed)
   Melissa Livingston is a 18 y.o. G1P0 at [redacted]w[redacted]d  admitted for induction of labor due to Hypertension.  Subjective:  Feeling mild contractions Objective: Filed Vitals:   09/27/14 2025 09/27/14 2100 09/27/14 2256  BP: 149/87  139/84  Pulse: 113  114  Temp: 98.1 F (36.7 C)  98.5 F (36.9 C)  TempSrc: Oral  Oral  Resp: 18  18  Height:  5\' 1"  (1.549 m)   Weight:  87.544 kg (193 lb)    Total I/O In: 297.9 [I.V.:297.9] Out: 400 [Urine:400]  FHT:  For about an hour, baseline was in the 180's with good variability and no decels.  Fetus active. Pt afebrile.  IVF bolus and O2 given.  Baseline now in 150;s, so probable accels SVE:   Dilation: 1.5 Exam by:: Manus Gunning CNM   Labs: Lab Results  Component Value Date   WBC 9.1 09/27/2014   HGB 9.2* 09/27/2014   HCT 30.1* 09/27/2014   MCV 67.8* 09/27/2014   PLT 223 09/27/2014    Assessment / Plan: IOL for GHTn, ripening phase  Labor: no Fetal Wellbeing:  Category I Pain Control:  Labor support without medications Anticipated MOD:  NSVD  CRESENZO-DISHMAN,Justyn Boyson 09/27/2014, 11:56 PM

## 2014-09-27 NOTE — H&P (Signed)
Melissa Livingston is a 18 y.o. female G1P0 with IUP at [redacted]w[redacted]d presenting for IOL for GHTN. She has had labile blood pressures for a few months, such as 160/88 (at 33 weeks) usually in the 120's/70-80 range.  Yesterday, she had 140/92 in the office, and was asked to return today for preeclampsia labs.  She was noted to again, have elevated BP, meeting criteria for GHTN.Marland Kitchen  She has had an off and on HA for 2 months, has one today. C/O dizziness and seeing spots, neither which are new sx. However, because she is > 39 weeks, there is not much to be gained by keeping her pregnant..  There is active fetal movement.  [facility]  since 9 wks  Prenatal History/Complications:  as above  Past Medical History: Past Medical History  Diagnosis Date  . Asthma     Past Surgical History: Past Surgical History  Procedure Laterality Date  . None    . Esophagogastroduodenoscopy (egd) with esophageal dilation N/A 07/19/2013    Procedure: ESOPHAGOGASTRODUODENOSCOPY (EGD) WITH ESOPHAGEAL DILATION;  Surgeon: Daneil Dolin, MD;  Location: [facility];  Service: Endoscopy;  Laterality: N/A;  2:45    Obstetrical History: OB History   Grav Para Term Preterm Abortions TAB SAB Ect Mult Living   1                Social History: History   Social History  . Marital Status: Single    Spouse Name: N/A    Number of Children: 0  . Years of Education: N/A   Occupational History  . out due to knee   .  [employer]   Social History Main Topics  . Smoking status: Never Smoker   . Smokeless tobacco: Never Used  . Alcohol Use: No  . Drug Use: No  . Sexual Activity: Yes    Birth Control/ Protection: None   Other Topics Concern  . Not on file   Social History Narrative  . No narrative on file    Family History: Family History  Problem Relation Age of Onset  . Multiple sclerosis Mother   . Colon cancer Other     maternal great uncle, age greater than 43  . Heart disease Maternal Grandfather   .  Heart disease Paternal Uncle   . Diabetes Paternal Uncle   . Hypertension Paternal Uncle     Allergies: Allergies  Allergen Reactions  . Lorabid [Loracarbef] Hives and Swelling  . Latex Itching and Rash  . Orange Fruit [Citrus] Rash     (Not in a hospital admission)   Prenatal Transfer Tool  Maternal Diabetes: No Genetic Screening: Normal Maternal Ultrasounds/Referrals: Normal Fetal Ultrasounds or other Referrals:  None Maternal Substance Abuse:  No Significant Maternal Medications:  None Significant Maternal Lab Results: Lab values include: Group B Strep negative     Review of Systems   Constitutional: Negative for fever, chills, weight loss, malaise/fatigue and diaphoresis.  HENT: Negative for hearing loss, ear pain, nosebleeds, congestion, sore throat, neck pain, tinnitus and ear discharge.   Eyes: Negative for blurred vision, double vision, photophobia, pain, discharge and redness.  Respiratory: Negative for cough, hemoptysis, sputum production, shortness of breath, wheezing and stridor.   Cardiovascular: Negative for chest pain, palpitations, orthopnea,  leg swelling  Gastrointestinal: Positive for abdominal pain. Negative for heartburn, nausea, vomiting, diarrhea, constipation, blood in stool Genitourinary: Negative for dysuria, urgency, frequency, hematuria and flank pain.  Musculoskeletal: Negative for myalgias, back pain, joint pain and  falls.  Skin: Negative for itching and rash.  Neurological: Negative for dizziness, tingling, tremors, sensory change, speech change, focal weakness, seizures, loss of consciousness, weakness and headaches.  Endo/Heme/Allergies: Negative for environmental allergies and polydipsia. Does not bruise/bleed easily.  Psychiatric/Behavioral: Negative for depression, suicidal ideas, hallucinations, memory loss and substance abuse. The patient is not nervous/anxious and does not have insomnia.       Last menstrual period  07/18/2013. General appearance: alert, appears stated age and no distress Lungs: clear to auscultation bilaterally Heart: regular rate and rhythm Abdomen: soft, non-tender; bowel sounds normal Pelvic: 1-2/50/-2 Extremities: Homans sign is negative, no sign of DVT DTR's 3+ Presentation: cephalic Fetal monitoringBaseline: 150 bpm, Variability: Good {> 6 bpm), Accelerations: Reactive and Decelerations: Absent Uterine activity  Irregular and mild      Prenatal labs: ABO, Rh: O/POS/-- (03/24 1140) Antibody: NEG (08/03 0000) Rubella:  immune RPR: NON REAC (08/03 0000)  HBsAg: NEGATIVE (03/24 1140)  HIV: NONREACTIVE (08/03 0000)  GBS: NOT DETECTED (10/05 1425)  1 hr Glucola  74/112/79 Genetic screening  normal Anatomy US normal   Results for orders placed during the hospital encounter of 09/27/14 (from the past 24 hour(s))  CBC   Collection Time    09/27/14  8:35 PM      Result Value Ref Range   WBC 9.1  4.0 - 10.5 K/uL   RBC 4.44  3.87 - 5.11 MIL/uL   Hemoglobin 9.2 (*) 12.0 - 15.0 g/dL   HCT 30.1 (*) 36.0 - 46.0 %   MCV 67.8 (*) 78.0 - 100.0 fL   MCH 20.7 (*) 26.0 - 34.0 pg   MCHC 30.6  30.0 - 36.0 g/dL   RDW 19.5 (*) 11.5 - 15.5 %   Platelets 223  150 - 400 K/uL  COMPREHENSIVE METABOLIC PANEL   Collection Time    09/27/14  8:35 PM      Result Value Ref Range   Sodium 136 (*) 137 - 147 mEq/L   Potassium 4.4  3.7 - 5.3 mEq/L   Chloride 101  96 - 112 mEq/L   CO2 22  19 - 32 mEq/L   Glucose, Bld 90  70 - 99 mg/dL   BUN 7  6 - 23 mg/dL   Creatinine, Ser 0.69  0.50 - 1.10 mg/dL   Calcium 8.9  8.4 - 10.5 mg/dL   Total Protein 6.6  6.0 - 8.3 g/dL   Albumin 2.8 (*) 3.5 - 5.2 g/dL   AST 15  0 - 37 U/L   ALT 8  0 - 35 U/L   Alkaline Phosphatase 176 (*) 39 - 117 U/L   Total Bilirubin 0.5  0.3 - 1.2 mg/dL   GFR calc non Af Amer >90  >90 mL/min   GFR calc Af Amer >90  >90 mL/min   Anion gap 13  5 - 15    Assessment: Melissa Livingston is a 18 y.o. G1P0 with an IUP at [redacted]w[redacted]d  presenting for IOL for GHTN  Plan: #Labor: Foley placed in cervix and inflated with 60cc H20.  Will start pitocin when foley falls out #Pain:  epidural #FWB Cat 1 #ID: GBS: negative  #MOF:  breast #MOC: depo #[facility] at [facility] 09/27/2014, 9:46 PM

## 2014-09-27 NOTE — Telephone Encounter (Signed)
Message copied by Doyne Keel on Thu Sep 27, 2014  9:44 AM ------      Message from: Jonnie Kind      Created: Thu Sep 27, 2014  6:21 AM      Regarding: [place] labs       I should have ordered [place] labs on this pt, CBC,CMET, PR/CRratio on urine sample.      Please have these done today and an appt Friday for review ------

## 2014-09-27 NOTE — Telephone Encounter (Signed)
Pt informed to be here today for CBC, CMET, PR/CR ratio-urine. Pt appt for Friday with Dr. Elonda Husky to discuss results per Dr. Glo Herring.

## 2014-09-27 NOTE — Progress Notes (Signed)
G1P0 [id number] Date of Delivery: 09/30/14  Last menstrual period 07/18/2013.   F/U from yesterday's visit per Dr. FGlo Herringfor preeclampsia labs.  BP noted to be elevated.  Pt has met criteria for GHTN.     Patient reports good fetal movement, denies any bleeding and no rupture of membranes symptoms or regular contractions. Patient has a HA today, much like the HA she has been having off and on for the past 2 months. All questions were answered.  Plan:  Discussed with Dr. EElonda Husky  Given dx of GHTN and that pt is >39 weeks, [facility]  [facility] were [facility] here, but [facility] readily and more quickly available.

## 2014-09-27 NOTE — Addendum Note (Signed)
Addended by: Christin Fudge on: 09/27/2014 02:20 PM   Modules accepted: Level of Service

## 2014-09-28 ENCOUNTER — Inpatient Hospital Stay (HOSPITAL_COMMUNITY): Payer: 59 | Admitting: Anesthesiology

## 2014-09-28 ENCOUNTER — Encounter: Payer: Medicaid Other | Admitting: Obstetrics & Gynecology

## 2014-09-28 ENCOUNTER — Encounter (HOSPITAL_COMMUNITY): Payer: 59 | Admitting: Anesthesiology

## 2014-09-28 LAB — CBC
HCT: 31.4 % — ABNORMAL LOW (ref 36.0–46.0)
Hemoglobin: 9.6 g/dL — ABNORMAL LOW (ref 12.0–15.0)
MCH: 20.5 pg — ABNORMAL LOW (ref 26.0–34.0)
MCHC: 30.6 g/dL (ref 30.0–36.0)
MCV: 67.1 fL — ABNORMAL LOW (ref 78.0–100.0)
Platelets: 234 10*3/uL (ref 150–400)
RBC: 4.68 MIL/uL (ref 3.87–5.11)
RDW: 19.5 % — AB (ref 11.5–15.5)
WBC: 14.4 10*3/uL — ABNORMAL HIGH (ref 4.0–10.5)

## 2014-09-28 LAB — ABO/RH: ABO/RH(D): O POS

## 2014-09-28 LAB — RPR

## 2014-09-28 LAB — PROTEIN / CREATININE RATIO, URINE
Creatinine, Urine: 70.9 mg/dL
Protein Creatinine Ratio: 0.13 (ref <0.15)
Total Protein, Urine: 9 mg/dL (ref 5–24)

## 2014-09-28 MED ORDER — EPHEDRINE 5 MG/ML INJ: 10.0000 mg | INTRAVENOUS | Status: DC | PRN

## 2014-09-28 MED ORDER — DIPHENHYDRAMINE HCL 50 MG/ML IJ SOLN: 12.5000 mg | INTRAMUSCULAR | Status: DC | PRN

## 2014-09-28 MED ORDER — FENTANYL 2.5 MCG/ML BUPIVACAINE 1/10 % EPIDURAL INFUSION (WH - ANES)
14.0000 mL/h | INTRAMUSCULAR | Status: DC | PRN
  Administered 2014-09-28: 14 mL/h via EPIDURAL
  Filled 2014-09-28: qty 125

## 2014-09-28 MED ORDER — LIDOCAINE-EPINEPHRINE (PF) 2 %-1:200000 IJ SOLN
INTRAMUSCULAR | Status: DC | PRN
  Administered 2014-09-28: 3 mL

## 2014-09-28 MED ORDER — BUPIVACAINE HCL (PF) 0.25 % IJ SOLN
INTRAMUSCULAR | Status: DC | PRN
  Administered 2014-09-28 (×2): 4 mL via EPIDURAL

## 2014-09-28 MED ORDER — PHENYLEPHRINE 40 MCG/ML (10ML) SYRINGE FOR IV PUSH (FOR BLOOD PRESSURE SUPPORT)
80.0000 ug | PREFILLED_SYRINGE | INTRAVENOUS | Status: DC | PRN
  Filled 2014-09-28: qty 10

## 2014-09-28 MED ORDER — LACTATED RINGERS IV SOLN
500.0000 mL | Freq: Once | INTRAVENOUS | Status: AC
  Administered 2014-09-28: 500 mL via INTRAVENOUS

## 2014-09-28 MED ORDER — PHENYLEPHRINE 40 MCG/ML (10ML) SYRINGE FOR IV PUSH (FOR BLOOD PRESSURE SUPPORT): 80.0000 ug | PREFILLED_SYRINGE | INTRAVENOUS | Status: DC | PRN

## 2014-09-28 NOTE — Progress Notes (Signed)
Patient ID: [name], female   DOB: 1996/12/02, 18 y.o.   MRN: [id number]  Cervix checked by RN  Unchanged Dilation: 4 Effacement (%): 50 Cervical Position: Middle Station: -3;-2 Presentation: Vertex Exam by:: Ronni Rumble, RN  Will continue Pitocin

## 2014-09-28 NOTE — Progress Notes (Signed)
Patient ID: [name], female   DOB: 1996-02-21, 18 y.o.   MRN: [id number]  Doing well. Having some more pain than she was earlier  Filed Vitals:   09/28/14 1329 09/28/14 1402 09/28/14 1435 09/28/14 1500  BP: 138/76 120/66 148/84 138/72  Pulse: 92 95 104 91  Temp:  97.9 F (36.6 C)    TempSrc:  Oral    Resp:  18 20 20   Height:      Weight:       FHR stable UCs every 3-4 min  Dilation: 4 Effacement (%): 50 Cervical Position: Middle Station: -2 Presentation: Vertex Exam by:: Elgie Congo RN  Cervix has not been checked recently   Will recheck cervix at 3.

## 2014-09-28 NOTE — Progress Notes (Signed)
Patient ID: [name], female   DOB: 08/17/1996, 18 y.o.   MRN: [id number]  Doing well  FHR reassuring UCs every 2-3 min  Pitocin on 18Mu/min  AROM clear fluid IUPC inserted  Will await increased labor

## 2014-09-28 NOTE — Anesthesia Preprocedure Evaluation (Signed)
Anesthesia Evaluation  Patient identified by MRN, date of birth, ID band Patient awake    Reviewed: Allergy & Precautions, H&P , NPO status , Patient's Chart, lab work & pertinent test results  History of Anesthesia Complications Negative for: history of anesthetic complications  Airway Mallampati: III TM Distance: >3 FB Neck ROM: Full    Dental  (+) Teeth Intact   Pulmonary asthma ,          Cardiovascular hypertension, Rhythm:Regular     Neuro/Psych PSYCHIATRIC DISORDERS Depression negative neurological ROS     GI/Hepatic Neg liver ROS, GERD-  Medicated and Controlled,  Endo/Other  negative endocrine ROS  Renal/GU negative Renal ROS     Musculoskeletal   Abdominal   Peds  Hematology  (+) anemia ,   Anesthesia Other Findings   Reproductive/Obstetrics                           Anesthesia Physical Anesthesia Plan  ASA: II  Anesthesia Plan: Epidural   Post-op Pain Management:    Induction:   Airway Management Planned:   Additional Equipment:   Intra-op Plan:   Post-operative Plan:   Informed Consent: I have reviewed the patients History and Physical, chart, labs and discussed the procedure including the risks, benefits and alternatives for the proposed anesthesia with the patient or authorized representative who has indicated his/her understanding and acceptance.     Plan Discussed with: Anesthesiologist  Anesthesia Plan Comments:         Anesthesia Quick Evaluation

## 2014-09-28 NOTE — Progress Notes (Signed)
Melissa Livingston is a 18 y.o. G1P0 at [redacted]w[redacted]d by ultrasound admitted for induction of labor due to Hypertension.  Subjective: Comfortable right now. Epidural discussed  Objective: BP 125/65  Pulse 90  Temp(Src) 98.1 F (36.7 C) (Oral)  Resp 16  Ht 5\' 1"  (1.549 m)  Wt 193 lb (87.544 kg)  BMI 36.49 kg/m2  LMP 07/18/2013 I/O last 3 completed shifts: In: 1702.1 [P.O.:550; I.V.:1152.1] Out: 1400 [Urine:1400]    FHT:  FHR: 140 bpm, variability: moderate,  accelerations:  Abscent,  decelerations:  Absent  Correction, does have accelerations UC:   Irregular, mild SVE:   Dilation: 4 Effacement (%): 50 Station: -2 Exam by:: [employer]: Lab Results  Component Value Date   WBC 9.1 09/27/2014   HGB 9.2* 09/27/2014   HCT 30.1* 09/27/2014   MCV 67.8* 09/27/2014   PLT 223 09/27/2014    Assessment / Plan: Induction of labor due to gestational hypertension,  progressing well on pitocin  Labor: Progressing normally Preeclampsia:  not on Magnesium Sulfate Fetal Wellbeing:  Category I Pain Control:  Labor support without medications I/D:  n/a Anticipated MOD:  NSVD  Anjuli Gemmill 09/28/2014, 9:54 AM

## 2014-09-28 NOTE — Anesthesia Procedure Notes (Signed)
Epidural Patient location during procedure: OB Start time: 09/28/2014 9:16 PM End time: 09/28/2014 9:30 PM  Staffing Anesthesiologist: Anahis Furgeson, CHRIS Performed by: anesthesiologist   Preanesthetic Checklist Completed: patient identified, surgical consent, pre-op evaluation, timeout performed, IV checked, risks and benefits discussed and monitors and equipment checked  Epidural Patient position: sitting Prep: site prepped and draped and DuraPrep Patient monitoring: heart rate, cardiac monitor, continuous pulse ox and blood pressure Approach: midline Location: L4-L5 Injection technique: LOR saline  Needle:  Needle type: Tuohy  Needle gauge: 17 G Needle length: 9 cm Needle insertion depth: 7 cm Catheter type: closed end flexible Catheter size: 19 Gauge Catheter at skin depth: 13 cm Test dose: 2% lidocaine with Epi 1:200 K  Assessment Events: blood not aspirated, injection not painful, no injection resistance, negative IV test and no paresthesia  Additional Notes H+P and labs checked, risks and benefits discussed with the patient, consent obtained, procedure tolerated well and without complications.

## 2014-09-28 NOTE — Progress Notes (Signed)
   Melissa Livingston is a 18 y.o. G1P0 at [redacted]w[redacted]d  admitted for induction of labor due to Hypertension.  Subjective:  Foley out, contracting irregularly Objective: Filed Vitals:   09/27/14 2256 09/28/14 0142 09/28/14 0246 09/28/14 0326  BP: 139/84 150/81 124/71 139/80  Pulse: 114 117 114 106  Temp: 98.5 F (36.9 C) 97.7 F (36.5 C)  98 F (36.7 C)  TempSrc: Oral Oral  Oral  Resp: 18 18 18 16   Height:      Weight:       Total I/O In: 937.5 [P.O.:300; I.V.:637.5] Out: 700 [Urine:700]  FHT:  FHR: 145 bpm, variability: moderate,  accelerations:  Present,  decelerations:  Absent UC:   irregular, every 2-6 minutes SVE:   Dilation: 4 Effacement (%): 50 Station: -2 Exam by:: [employer]: Lab Results  Component Value Date   WBC 9.1 09/27/2014   HGB 9.2* 09/27/2014   HCT 30.1* 09/27/2014   MCV 67.8* 09/27/2014   PLT 223 09/27/2014    Assessment / Plan: IOL for GHTN, ripening phase complete.  Labor: Progressing normally Fetal Wellbeing:  Category I Pain Control:  Labor support without medications Anticipated MOD:  NSVD  CRESENZO-DISHMAN,Estelle Skibicki 09/28/2014, 3:43 AM

## 2014-09-28 NOTE — Progress Notes (Signed)
Upon RN entering room, family states the patient is "passing out."  Patient is unable to hold up her head and is not responding to verbal commands.  Patient opens eyes and moves head up with ammonia packet placed under nose.  Dr. Deniece Ree notified and comes to bedside to assess patient.  After three minutes the patient is able to communicate with RN and MD and states, "something isn't right."  Patient is then able to verbalize her name and DOB and that she is "just in pain" and verbalizes her request to receive on epidural.

## 2014-09-29 ENCOUNTER — Encounter (HOSPITAL_COMMUNITY): Payer: Self-pay | Admitting: *Deleted

## 2014-09-29 ENCOUNTER — Encounter (HOSPITAL_COMMUNITY)
Admission: AD | Disposition: A | Discharge status: Home / Self Care | Payer: Self-pay | Source: Ambulatory Visit | Attending: Obstetrics and Gynecology

## 2014-09-29 DIAGNOSIS — K219 Gastro-esophageal reflux disease without esophagitis: Secondary | ICD-10-CM

## 2014-09-29 DIAGNOSIS — O139 Gestational [pregnancy-induced] hypertension without significant proteinuria, unspecified trimester: Secondary | ICD-10-CM

## 2014-09-29 DIAGNOSIS — O133 Gestational [pregnancy-induced] hypertension without significant proteinuria, third trimester: Secondary | ICD-10-CM

## 2014-09-29 LAB — CBC
HCT: 28.3 % — ABNORMAL LOW (ref 36.0–46.0)
HCT: 25.9 % — ABNORMAL LOW (ref 36.0–46.0)
Hemoglobin: 8.7 g/dL — ABNORMAL LOW (ref 12.0–15.0)
Hemoglobin: 8 g/dL — ABNORMAL LOW (ref 12.0–15.0)
MCH: 20.8 pg — AB (ref 26.0–34.0)
MCH: 20.7 pg — ABNORMAL LOW (ref 26.0–34.0)
MCHC: 30.7 g/dL (ref 30.0–36.0)
MCHC: 30.9 g/dL (ref 30.0–36.0)
MCV: 67.7 fL — ABNORMAL LOW (ref 78.0–100.0)
MCV: 67.1 fL — AB (ref 78.0–100.0)
PLATELETS: 221 10*3/uL (ref 150–400)
Platelets: 201 10*3/uL (ref 150–400)
RBC: 4.18 MIL/uL (ref 3.87–5.11)
RBC: 3.86 MIL/uL — ABNORMAL LOW (ref 3.87–5.11)
RDW: 19.3 % — AB (ref 11.5–15.5)
RDW: 19.5 % — AB (ref 11.5–15.5)
WBC: 22.1 10*3/uL — ABNORMAL HIGH (ref 4.0–10.5)
WBC: 22.2 10*3/uL — AB (ref 4.0–10.5)

## 2014-09-29 SURGERY — Surgical Case
Anesthesia: Epidural | Site: Abdomen

## 2014-09-29 MED ORDER — ONDANSETRON HCL 4 MG/2ML IJ SOLN: 4.0000 mg | INTRAMUSCULAR | Status: DC | PRN

## 2014-09-29 MED ORDER — LACTATED RINGERS IV SOLN
INTRAVENOUS | Status: DC | PRN
  Administered 2014-09-29 (×2): via INTRAVENOUS

## 2014-09-29 MED ORDER — LIDOCAINE-EPINEPHRINE (PF) 2 %-1:200000 IJ SOLN
INTRAMUSCULAR | Status: AC
  Filled 2014-09-29: qty 20

## 2014-09-29 MED ORDER — LACTATED RINGERS IV SOLN
INTRAVENOUS | Status: DC | PRN
  Administered 2014-09-29: 03:00:00 via INTRAVENOUS

## 2014-09-29 MED ORDER — PNEUMOCOCCAL VAC POLYVALENT 25 MCG/0.5ML IJ INJ
0.5000 mL | INJECTION | INTRAMUSCULAR | Status: AC
Start: 2014-10-01 — End: 2014-10-01
  Administered 2014-10-01: 0.5 mL via INTRAMUSCULAR
  Filled 2014-09-29: qty 0.5

## 2014-09-29 MED ORDER — DEXAMETHASONE SODIUM PHOSPHATE 10 MG/ML IJ SOLN
INTRAMUSCULAR | Status: AC
  Filled 2014-09-29: qty 1

## 2014-09-29 MED ORDER — OXYTOCIN 10 UNIT/ML IJ SOLN
40.0000 [IU] | INTRAVENOUS | Status: DC | PRN
  Administered 2014-09-29: 40 [IU] via INTRAVENOUS

## 2014-09-29 MED ORDER — SENNOSIDES-DOCUSATE SODIUM 8.6-50 MG PO TABS
2.0000 | ORAL_TABLET | ORAL | Status: DC
  Administered 2014-09-29 – 2014-09-30 (×2): 2 via ORAL
  Filled 2014-09-29 (×2): qty 2

## 2014-09-29 MED ORDER — LANOLIN HYDROUS EX OINT: 1.0000 "application " | TOPICAL_OINTMENT | CUTANEOUS | Status: DC | PRN

## 2014-09-29 MED ORDER — DIPHENHYDRAMINE HCL 50 MG/ML IJ SOLN: 12.5000 mg | INTRAMUSCULAR | Status: DC | PRN

## 2014-09-29 MED ORDER — ONDANSETRON HCL 4 MG/2ML IJ SOLN
INTRAMUSCULAR | Status: AC
  Filled 2014-09-29: qty 2

## 2014-09-29 MED ORDER — OXYTOCIN 40 UNITS IN LACTATED RINGERS INFUSION - SIMPLE MED: 62.5000 mL/h | INTRAVENOUS | Status: AC

## 2014-09-29 MED ORDER — ONDANSETRON HCL 4 MG PO TABS: 4.0000 mg | ORAL_TABLET | ORAL | Status: DC | PRN

## 2014-09-29 MED ORDER — FENTANYL CITRATE 0.05 MG/ML IJ SOLN
INTRAMUSCULAR | Status: DC | PRN
  Administered 2014-09-29: 100 ug via INTRAVENOUS

## 2014-09-29 MED ORDER — WITCH HAZEL-GLYCERIN EX PADS: 1.0000 "application " | MEDICATED_PAD | CUTANEOUS | Status: DC | PRN

## 2014-09-29 MED ORDER — NALOXONE HCL 0.4 MG/ML IJ SOLN: 0.4000 mg | INTRAMUSCULAR | Status: DC | PRN

## 2014-09-29 MED ORDER — NALOXONE HCL 1 MG/ML IJ SOLN
1.0000 ug/kg/h | INTRAVENOUS | Status: DC | PRN
  Filled 2014-09-29: qty 2

## 2014-09-29 MED ORDER — SODIUM BICARBONATE 8.4 % IV SOLN
INTRAVENOUS | Status: AC
  Filled 2014-09-29: qty 50

## 2014-09-29 MED ORDER — SCOPOLAMINE 1 MG/3DAYS TD PT72
1.0000 | MEDICATED_PATCH | Freq: Once | TRANSDERMAL | Status: DC
  Filled 2014-09-29: qty 1

## 2014-09-29 MED ORDER — IBUPROFEN 600 MG PO TABS
600.0000 mg | ORAL_TABLET | Freq: Four times a day (QID) | ORAL | Status: DC
  Administered 2014-09-29 – 2014-10-01 (×9): 600 mg via ORAL
  Filled 2014-09-29 (×9): qty 1

## 2014-09-29 MED ORDER — CEFAZOLIN SODIUM-DEXTROSE 2-3 GM-% IV SOLR
INTRAVENOUS | Status: AC
  Filled 2014-09-29: qty 50

## 2014-09-29 MED ORDER — SODIUM BICARBONATE 8.4 % IV SOLN
INTRAVENOUS | Status: DC | PRN
  Administered 2014-09-29: 10 mL via EPIDURAL

## 2014-09-29 MED ORDER — NALBUPHINE HCL 10 MG/ML IJ SOLN: 5.0000 mg | INTRAMUSCULAR | Status: DC | PRN

## 2014-09-29 MED ORDER — TETANUS-DIPHTH-ACELL PERTUSSIS 5-2.5-18.5 LF-MCG/0.5 IM SUSP
0.5000 mL | Freq: Once | INTRAMUSCULAR | Status: AC
  Administered 2014-09-30: 0.5 mL via INTRAMUSCULAR

## 2014-09-29 MED ORDER — DIPHENHYDRAMINE HCL 25 MG PO CAPS: 25.0000 mg | ORAL_CAPSULE | Freq: Four times a day (QID) | ORAL | Status: DC | PRN

## 2014-09-29 MED ORDER — CEFAZOLIN SODIUM-DEXTROSE 2-3 GM-% IV SOLR
INTRAVENOUS | Status: DC | PRN
  Administered 2014-09-29: 2 g via INTRAVENOUS

## 2014-09-29 MED ORDER — KETOROLAC TROMETHAMINE 30 MG/ML IJ SOLN: 30.0000 mg | Freq: Four times a day (QID) | INTRAMUSCULAR | Status: DC | PRN

## 2014-09-29 MED ORDER — NALBUPHINE HCL 10 MG/ML IJ SOLN: 5.0000 mg | Freq: Once | INTRAMUSCULAR | Status: AC | PRN

## 2014-09-29 MED ORDER — NALBUPHINE HCL 10 MG/ML IJ SOLN
5.0000 mg | Freq: Once | INTRAMUSCULAR | Status: AC | PRN
  Filled 2014-09-29: qty 1

## 2014-09-29 MED ORDER — DIBUCAINE 1 % RE OINT
1.0000 | TOPICAL_OINTMENT | RECTAL | Status: DC | PRN
Start: 2014-09-29 — End: 2014-10-01

## 2014-09-29 MED ORDER — CHLOROPROCAINE HCL 3 % IJ SOLN
INTRAMUSCULAR | Status: AC
  Filled 2014-09-29: qty 20

## 2014-09-29 MED ORDER — PRENATAL MULTIVITAMIN CH
1.0000 | ORAL_TABLET | Freq: Every day | ORAL | Status: DC
  Administered 2014-09-29 – 2014-10-01 (×3): 1 via ORAL
  Filled 2014-09-29 (×3): qty 1

## 2014-09-29 MED ORDER — MENTHOL 3 MG MT LOZG: 1.0000 | LOZENGE | OROMUCOSAL | Status: DC | PRN

## 2014-09-29 MED ORDER — ONDANSETRON HCL 4 MG/2ML IJ SOLN
INTRAMUSCULAR | Status: DC | PRN
  Administered 2014-09-29: 4 mg via INTRAVENOUS

## 2014-09-29 MED ORDER — SIMETHICONE 80 MG PO CHEW: 80.0000 mg | CHEWABLE_TABLET | ORAL | Status: DC | PRN

## 2014-09-29 MED ORDER — OXYTOCIN 10 UNIT/ML IJ SOLN
INTRAMUSCULAR | Status: AC
  Filled 2014-09-29: qty 4

## 2014-09-29 MED ORDER — MEPERIDINE HCL 25 MG/ML IJ SOLN: 6.2500 mg | INTRAMUSCULAR | Status: DC | PRN

## 2014-09-29 MED ORDER — FENTANYL CITRATE 0.05 MG/ML IJ SOLN
INTRAMUSCULAR | Status: AC
  Filled 2014-09-29: qty 2

## 2014-09-29 MED ORDER — ZOLPIDEM TARTRATE 5 MG PO TABS: 5.0000 mg | ORAL_TABLET | Freq: Every evening | ORAL | Status: DC | PRN

## 2014-09-29 MED ORDER — LACTATED RINGERS IV SOLN
INTRAVENOUS | Status: DC
  Administered 2014-09-29 (×2): via INTRAVENOUS

## 2014-09-29 MED ORDER — SODIUM CHLORIDE 0.9 % IJ SOLN: 3.0000 mL | INTRAMUSCULAR | Status: DC | PRN

## 2014-09-29 MED ORDER — FENTANYL CITRATE 0.05 MG/ML IJ SOLN: 25.0000 ug | INTRAMUSCULAR | Status: DC | PRN

## 2014-09-29 MED ORDER — OXYCODONE-ACETAMINOPHEN 5-325 MG PO TABS
2.0000 | ORAL_TABLET | ORAL | Status: DC | PRN
Start: 2014-09-29 — End: 2014-10-01
  Administered 2014-09-30: 2 via ORAL
  Filled 2014-09-29: qty 2

## 2014-09-29 MED ORDER — ONDANSETRON HCL 4 MG/2ML IJ SOLN: 4.0000 mg | Freq: Three times a day (TID) | INTRAMUSCULAR | Status: DC | PRN

## 2014-09-29 MED ORDER — KETOROLAC TROMETHAMINE 30 MG/ML IJ SOLN
INTRAMUSCULAR | Status: AC
  Filled 2014-09-29: qty 1

## 2014-09-29 MED ORDER — KETOROLAC TROMETHAMINE 30 MG/ML IJ SOLN
30.0000 mg | Freq: Four times a day (QID) | INTRAMUSCULAR | Status: DC | PRN
  Administered 2014-09-29: 30 mg via INTRAVENOUS

## 2014-09-29 MED ORDER — SODIUM BICARBONATE 8.4 % IV SOLN
INTRAVENOUS | Status: DC | PRN
  Administered 2014-09-29 (×2): 10 mL via EPIDURAL

## 2014-09-29 MED ORDER — OXYCODONE-ACETAMINOPHEN 5-325 MG PO TABS
1.0000 | ORAL_TABLET | ORAL | Status: DC | PRN
Start: 2014-09-29 — End: 2014-10-01

## 2014-09-29 MED ORDER — SIMETHICONE 80 MG PO CHEW
80.0000 mg | CHEWABLE_TABLET | Freq: Three times a day (TID) | ORAL | Status: DC
  Administered 2014-09-29 – 2014-10-01 (×7): 80 mg via ORAL
  Filled 2014-09-29 (×7): qty 1

## 2014-09-29 MED ORDER — DIPHENHYDRAMINE HCL 25 MG PO CAPS
25.0000 mg | ORAL_CAPSULE | ORAL | Status: DC | PRN
  Administered 2014-09-29: 25 mg via ORAL
  Filled 2014-09-29: qty 1

## 2014-09-29 MED ORDER — MORPHINE SULFATE 0.5 MG/ML IJ SOLN
INTRAMUSCULAR | Status: AC
  Filled 2014-09-29: qty 10

## 2014-09-29 MED ORDER — DEXAMETHASONE SODIUM PHOSPHATE 10 MG/ML IJ SOLN
INTRAMUSCULAR | Status: DC | PRN
  Administered 2014-09-29: 10 mg via INTRAVENOUS

## 2014-09-29 MED ORDER — SIMETHICONE 80 MG PO CHEW
80.0000 mg | CHEWABLE_TABLET | ORAL | Status: DC
  Administered 2014-09-29 – 2014-09-30 (×2): 80 mg via ORAL
  Filled 2014-09-29 (×2): qty 1

## 2014-09-29 MED ORDER — SCOPOLAMINE 1 MG/3DAYS TD PT72
MEDICATED_PATCH | TRANSDERMAL | Status: AC
  Filled 2014-09-29: qty 1

## 2014-09-29 MED ORDER — MORPHINE SULFATE (PF) 0.5 MG/ML IJ SOLN
INTRAMUSCULAR | Status: DC | PRN
  Administered 2014-09-29: 4 mg via EPIDURAL
  Administered 2014-09-29: 1 mg via INTRAVENOUS

## 2014-09-29 MED ORDER — SCOPOLAMINE 1 MG/3DAYS TD PT72
MEDICATED_PATCH | TRANSDERMAL | Status: DC | PRN
  Administered 2014-09-29: 1 via TRANSDERMAL

## 2014-09-29 MED ORDER — NALBUPHINE HCL 10 MG/ML IJ SOLN
5.0000 mg | INTRAMUSCULAR | Status: DC | PRN
  Administered 2014-09-29: 5 mg via INTRAVENOUS

## 2014-09-29 SURGICAL SUPPLY — 34 items
APL SKNCLS STERI-STRIP NONHPOA (GAUZE/BANDAGES/DRESSINGS) ×1
BENZOIN TINCTURE PRP APPL 2/3 (GAUZE/BANDAGES/DRESSINGS) ×1 IMPLANT
CLAMP CORD UMBIL (MISCELLANEOUS) IMPLANT
CLOTH BEACON ORANGE TIMEOUT ST (SAFETY) ×2 IMPLANT
DRAPE SHEET LG 3/4 BI-LAMINATE (DRAPES) IMPLANT
DRSG OPSITE POSTOP 4X10 (GAUZE/BANDAGES/DRESSINGS) ×2 IMPLANT
DURAPREP 26ML APPLICATOR (WOUND CARE) ×2 IMPLANT
ELECT REM PT RETURN 9FT ADLT (ELECTROSURGICAL) ×2
ELECTRODE REM PT RTRN 9FT ADLT (ELECTROSURGICAL) ×1 IMPLANT
EXTRACTOR VACUUM KIWI (MISCELLANEOUS) IMPLANT
GLOVE BIO SURGEON ST LM GN SZ9 (GLOVE) ×2 IMPLANT
GLOVE BIOGEL PI IND STRL 9 (GLOVE) ×1 IMPLANT
GLOVE BIOGEL PI INDICATOR 9 (GLOVE) ×1
GOWN STRL REUS W/TWL 2XL LVL3 (GOWN DISPOSABLE) ×2 IMPLANT
GOWN STRL REUS W/TWL LRG LVL3 (GOWN DISPOSABLE) ×2 IMPLANT
NDL HYPO 25X5/8 SAFETYGLIDE (NEEDLE) IMPLANT
NEEDLE HYPO 25X5/8 SAFETYGLIDE (NEEDLE) IMPLANT
NS IRRIG 1000ML POUR BTL (IV SOLUTION) ×2 IMPLANT
PACK C SECTION WH (CUSTOM PROCEDURE TRAY) ×2 IMPLANT
PAD OB MATERNITY 4.3X12.25 (PERSONAL CARE ITEMS) ×2 IMPLANT
RTRCTR C-SECT PINK 25CM LRG (MISCELLANEOUS) IMPLANT
RTRCTR C-SECT PINK 34CM XLRG (MISCELLANEOUS) ×1 IMPLANT
STRIP CLOSURE SKIN 1/2X4 (GAUZE/BANDAGES/DRESSINGS) ×1 IMPLANT
SUT MNCRL 0 VIOLET CTX 36 (SUTURE) ×2 IMPLANT
SUT MONOCRYL 0 CTX 36 (SUTURE) ×3
SUT VIC AB 0 CT1 27 (SUTURE) ×2
SUT VIC AB 0 CT1 27XBRD ANBCTR (SUTURE) ×1 IMPLANT
SUT VIC AB 2-0 CT1 27 (SUTURE) ×4
SUT VIC AB 2-0 CT1 TAPERPNT 27 (SUTURE) ×1 IMPLANT
SUT VIC AB 4-0 KS 27 (SUTURE) ×2 IMPLANT
SYR BULB IRRIGATION 50ML (SYRINGE) IMPLANT
TOWEL OR 17X24 6PK STRL BLUE (TOWEL DISPOSABLE) ×2 IMPLANT
TRAY FOLEY CATH 14FR (SET/KITS/TRAYS/PACK) ×2 IMPLANT
WATER STERILE IRR 1000ML POUR (IV SOLUTION) ×2 IMPLANT

## 2014-09-29 NOTE — Lactation Note (Addendum)
This note was copied from the chart of Melissa Livingston. Lactation Consultation Note  Patient Name: Melissa Livingston Today's Date: 09/29/2014 Reason for consult: Initial assessment Baby 13 hours of life. Mom reports baby having hard time latching. Mom's left nipple is flat. Mom's right nipple more everted. LC able to demonstrate how to latch baby in football position to left breast using "teacup" hold. Began to assist mom to attempt to latch baby but mom's eyes closed, mom not able to stay awake. Discussed with mom and patient's MBU RN Eustaquio Maize that mom may need to be fitted with NS for left breast. Enc mom to keep putting baby directly to breast. Mom wearing breast shields and knows how to use hand pump to pre-pump and evert nipples prior to latching baby. Enc mom to call for assistance as needed. FOB holding infant so mom can rest.   Maternal Data Has patient been taught Hand Expression?: Yes Does the patient have breastfeeding experience prior to this delivery?: No  Feeding Feeding Type: Breast Fed Length of feed: 10 min  LATCH Score/Interventions Latch: Repeated attempts needed to sustain latch, nipple held in mouth throughout feeding, stimulation needed to elicit sucking reflex. Intervention(s): Adjust position;Assist with latch;Breast compression  Audible Swallowing: None  Type of Nipple: Flat  Comfort (Breast/Nipple): Soft / non-tender     Hold (Positioning): Assistance needed to correctly position infant at breast and maintain latch.  LATCH Score: 5  Lactation Tools Discussed/Used     Consult Status Consult Status: Follow-up Date: 09/30/14 Follow-up type: In-patient    Inocente Salles 09/29/2014, 4:05 PM

## 2014-09-29 NOTE — Transfer of Care (Signed)
Immediate Anesthesia Transfer of Care Note  Patient: Melissa Livingston  Procedure(s) Performed: Procedure(s): CESAREAN SECTION (N/A)  Patient Location: PACU  Anesthesia Type:Epidural  Level of Consciousness: awake, alert , oriented and patient cooperative  Airway & Oxygen Therapy: Patient Spontanous Breathing  Post-op Assessment: Report given to PACU RN and Post -op Vital signs reviewed and stable  Post vital signs: Reviewed and stable  Complications: No apparent anesthesia complications

## 2014-09-29 NOTE — Op Note (Signed)
09/27/2014 - 09/29/2014  3:50 AM  PATIENT:  Melissa Livingston  18 y.o. female  PRE-OPERATIVE DIAGNOSIS:  c-section for arrest of descent, pregnancy 39 weeks 6 days, cephalopelvic disproportion, failed induction for gestational hypertension  POST-OPERATIVE DIAGNOSIS:  c-section for arrest of descent, same, delivered PROCEDURE:  Procedure(s): CESAREAN SECTION (N/A) primary low transverse cesarean section  SURGEON:  Surgeon(s) and Role:    * Melissa Kind, MD - Primary    * Melissa [place] Prudencio Pair, MD - Fellow  PHYSICIAN ASSISTANT:   ASSISTANTS: none   ANESTHESIA:   spinal  EBL:  Total I/O In: 1000 [I.V.:1000] Out: 1175 [Urine:275; Blood:900]  BLOOD ADMINISTERED:none  DRAINS: Urinary Catheter (Foley)   LOCAL MEDICATIONS USED:  NONE  SPECIMEN:  Source of Specimen:  Placenta to labor and delivery  DISPOSITION OF SPECIMEN:  Labor and delivery  COUNTS:  YES  TOURNIQUET:  * No tourniquets in log *  DICTATION: .Dragon Dictation  PLAN OF CARE: Has inpatient orders  PATIENT DISPOSITION:  PACU - hemodynamically stable.   Delay start of Pharmacological VTE agent (>24hrs) due to surgical blood loss or risk of bleeding: not applicable Indications: [name] completely dilated, pushed for 1-1/2 hours, became exhausted, but was still at a +2 to +1 station between contractions she had some descent with pushing, but was in left occiput posterior or left occiput transverse position and had very prominent ischial spine on the left she was not considered an optimal candidate for vacuum assistance. Consideration was given to rotation of the vertex but patient declined operative vaginal delivery and was taken to cesarean section. Details of procedure:. Patient was taken operating room, prepped and draped for lower abdominal surgery with timeout conducted. The legs were in a flexed position with posterior the knees spread with a pillow beneath her knees to allow for vaginal access. Epidural was  topped up to give her adequate analgesia timeout conducted with surgical team confirming the procedure. Ancef was administered at 2 g. Heart well being had been documented prior to the prepping procedure. After adequate analgesia was confirmed, transverse lower abdominal incision was made 1 cm above the natural skin crease, sharp dissection to the fascia which was opened transversely, and sharp dissection off the fascia upward and caudad rectus muscles were split in the midline as were the pyramidalis muscles in order to allow access to peritoneal cavity with opening of the peritoneum bluntly achieved. Fascia was opened told that further the use of additional room after became apparent that there was apparently a tight fit. Replaced in the abdominal cavity, and bladder flap was developed on the lower uterine segment. Transverse incision was made at the level of the bladder flap, extended laterally and up cephalad and caudad to allow identification fetal parts, we were at the level of the fetal neck and shoulders. Occiput posterior to left occiput posterior position. The vertex was impacted sufficiently that we were having difficulty with delivery. The shoulder could be pushed upward and, with care given to avoid the arms escaping the uterine incision. Fetal mouth could be suctioned through the incision and prior to delivery of the head from the pelvis. A nurse was asked to push from the knees to lift the vertex. This only partial help and Dr. Deniece Livingston descend the fetal pushing position was able to lift the vertex of sufficiently to dislodge it that I could place my left hand beneath the vertex rotated into the incision and deliver the baby. The infant was passed to waiting pediatricians.  Apgars 8 and 9 were assigned. See their notes for details. The uterine incision was just above the cervix. The placenta delivered easily and response to uterine massage area and the uterus was swabbed out, then closed in 2 layer  closure from each side sewing to the midline with a running locking first layer of 0 Monocryl followed by a second layer of continuous running 0 Monocryl.. Abdomen was irrigated,, and anterior peritoneum closed with 2-0 Vicryl with sponge and needle counts correct. The fascia was closed with continuous running 0 Vicryl, the subcutaneous tissues loosely reapproximated with 3 interrupted sutures of horizontal mattress sutures of 2-0 Vicryl followed by subcuticular 4-0 Vicryl skin incision closure patient tolerated procedure well went to recovery in good condition sponge and needle counts were correct.

## 2014-09-29 NOTE — Brief Op Note (Signed)
09/27/2014 - 09/29/2014  3:50 AM  PATIENT:  Melissa Livingston  18 y.o. female  PRE-OPERATIVE DIAGNOSIS:  c-section for arrest of descent, pregnancy 39 weeks 6 days, cephalopelvic disproportion, failed induction for gestational hypertension  POST-OPERATIVE DIAGNOSIS:  c-section for arrest of descent, same, delivered PROCEDURE:  Procedure(s): CESAREAN SECTION (N/A) primary low transverse cesarean section  SURGEON:  Surgeon(s) and Role:    * Jonnie Kind, MD - Primary    * Marita [place] Prudencio Pair, MD - Fellow  PHYSICIAN ASSISTANT:   ASSISTANTS: none   ANESTHESIA:   spinal  EBL:  Total I/O In: 1000 [I.V.:1000] Out: 1175 [Urine:275; Blood:900]  BLOOD ADMINISTERED:none  DRAINS: Urinary Catheter (Foley)   LOCAL MEDICATIONS USED:  NONE  SPECIMEN:  Source of Specimen:  Placenta to labor and delivery  DISPOSITION OF SPECIMEN:  Labor and delivery  COUNTS:  YES  TOURNIQUET:  * No tourniquets in log *  DICTATION: .Dragon Dictation  PLAN OF CARE: Has inpatient orders  PATIENT DISPOSITION:  PACU - hemodynamically stable.   Delay start of Pharmacological VTE agent (>24hrs) due to surgical blood loss or risk of bleeding: not applicable

## 2014-09-29 NOTE — Anesthesia Postprocedure Evaluation (Signed)
  Anesthesia Post-op Note  Patient: Hydrographic surveyor) Performed: Procedure(s): CESAREAN SECTION (N/A)  Patient Location: PACU  Anesthesia Type:Epidural  Level of Consciousness: awake and oriented  Airway and Oxygen Therapy: Patient Spontanous Breathing  Post-op Pain: none  Post-op Assessment: Post-op Vital signs reviewed, Patient's Cardiovascular Status Stable, Respiratory Function Stable, Patent Airway, No signs of Nausea or vomiting and Pain level controlled  Post-op Vital Signs: Reviewed and stable  Last Vitals:  Filed Vitals:   09/29/14 0617  BP: 118/66  Pulse: 96  Temp: 36.8 C  Resp: 16    Complications: No apparent anesthesia complications

## 2014-09-30 LAB — CBC
HEMATOCRIT: 20.8 % — AB (ref 36.0–46.0)
Hemoglobin: 6.3 g/dL — CL (ref 12.0–15.0)
MCH: 20.7 pg — ABNORMAL LOW (ref 26.0–34.0)
MCHC: 30.3 g/dL (ref 30.0–36.0)
MCV: 68.2 fL — ABNORMAL LOW (ref 78.0–100.0)
Platelets: 199 10*3/uL (ref 150–400)
RBC: 3.05 MIL/uL — AB (ref 3.87–5.11)
RDW: 20.1 % — ABNORMAL HIGH (ref 11.5–15.5)
WBC: 16.5 10*3/uL — AB (ref 4.0–10.5)

## 2014-09-30 MED ORDER — FERROUS SULFATE 325 (65 FE) MG PO TABS
325.0000 mg | ORAL_TABLET | Freq: Three times a day (TID) | ORAL | Status: DC
  Administered 2014-09-30 – 2014-10-01 (×5): 325 mg via ORAL
  Filled 2014-09-30 (×5): qty 1

## 2014-09-30 NOTE — Progress Notes (Signed)
I spoke with and examined patient and agree with resident/PA/SNM's note and plan of care.  Baby under phototherapy. Plan for d/c tomorrow.  Roma Schanz, CNM, [facility] 09/30/2014 8:24 AM

## 2014-09-30 NOTE — Lactation Note (Signed)
This note was copied from the chart of Melissa Livingston. Lactation Consultation Note  Patient Name: Melissa Livingston Today's Date: 09/30/2014 Reason for consult: Follow-up assessment;Hyperbilirubinemia;Difficult latch.  Mom has been using a nipple shield to latch baby when able.  Baby remains under triple phototx and mom is pumping q3h and feeding by bottle, both ebm and formula.  She recently obtained 5 ml's but baby too sleepy to be fed at this time.  LC reviewed ebm milk storage guidelines (Baby and Me, page 25) and encouraged her to combine DEBP with hand expression and breast massage every 3 hours and latch baby to breast when able.  RN, Sharyn Lull reports that mom is able to demonstrate proper use of NS for latching to breast.  LC recommends mom cue feed baby and call for latch assistance as needed.   Maternal Data Has patient been taught Hand Expression?: Yes Does the patient have breastfeeding experience prior to this delivery?: No  Feeding Feeding Type: Formula Nipple Type: Slow - flow  LATCH Score/Interventions           most recent LATCH score=7 right before midnight, per RN assessment; baby has had multiple feedings at breast for 15-20  minutes today (early am, but RN caring for this couplet states she has not seen a latch on her shift)           Lactation Tools Discussed/Used Pump Review: Milk Storage (reviewed pumping recommendations, breast massage and hand expression) Cue feedings, pumping frequency q3h  Consult Status Consult Status: Follow-up Date: 10/01/14 Follow-up type: In-patient    Junious Dresser [facility] 09/30/2014, 5:11 PM

## 2014-09-30 NOTE — Anesthesia Postprocedure Evaluation (Signed)
  Anesthesia Post-op Note  Patient: Hydrographic surveyor) Performed: Procedure(s): CESAREAN SECTION (N/A)  Patient Location: Mother/Baby  Anesthesia Type:Epidural  Level of Consciousness: awake, alert  and oriented  Airway and Oxygen Therapy: Patient Spontanous Breathing  Post-op Pain: none  Post-op Assessment: Post-op Vital signs reviewed, Patient's Cardiovascular Status Stable, Respiratory Function Stable, No headache, No backache, No residual numbness and No residual motor weakness  Post-op Vital Signs: Reviewed and stable  Last Vitals:  Filed Vitals:   09/30/14 0547  BP: 114/68  Pulse: 93  Temp: 37.2 C  Resp: 20    Complications: No apparent anesthesia complications

## 2014-09-30 NOTE — Progress Notes (Signed)
Post Partum Day 1 Subjective:  Melissa Livingston is a 18 y.o. G1P1001 [redacted]w[redacted]d s/p pLTCS 2/2 cevalopelvic disproportion.  No acute events overnight.  Pt denies problems with ambulating, voiding or po intake.  She denies nausea or vomiting.  Pain is moderately controlled.  She has not had flatus. She has not had bowel movement.  Lochia Small.  Plan for birth control is Depo-Provera.  Method of Feeding: Breast  Denies dizziness, lightheadness, palpitations, and chest pain.  Objective: Blood pressure 114/68, pulse 93, temperature 98.9 F (37.2 C), temperature source Oral, resp. rate 20, height 5\' 1"  (1.549 m), weight 87.544 kg (193 lb), last menstrual period 07/18/2013, SpO2 100.00%, unknown if currently breastfeeding.  Physical Exam:  General: alert, cooperative and no distress Lochia:normal flow Chest: CTAB Heart: RRR no m/r/g Abdomen: +BS, soft, nontender,  Uterine Fundus: firm Incision: Dressing clean, dry, and intact DVT Evaluation: No evidence of DVT seen on physical exam. Extremities: No edema   Recent Labs  09/29/14 1200 09/30/14 0544  HGB 8.0* 6.3*  HCT 25.9* 20.8*    Assessment/Plan:  ASSESSMENT: Melissa Livingston is a 18 y.o. G1P1001 [redacted]w[redacted]d s/p pLTCS 2/2 CPD  Potential discharge home tomorrow   LOS: 3 days   Dimas Chyle 09/30/2014, 7:52 AM

## 2014-09-30 NOTE — Addendum Note (Signed)
Addendum created 09/30/14 0908 by Jonna Munro, CRNA   Modules edited: Notes Section   Notes Section:  File: [id number]

## 2014-09-30 NOTE — Progress Notes (Signed)
Acknowledged order for social work consult to assess mother's hx of depression.  Met with mother.   FOB and several other family were visiting.  Family was very pleasant and receptive to CSW.  Mother denies any hx of depression.  She and grandmother spoke of how difficult it has been not been able to hold the baby because she has been under the bili lights.   Validated their feelings and provided emotional support.  No acute social concerns related at this time.  Informed them of CSW availability.  

## 2014-10-01 ENCOUNTER — Encounter: Payer: Medicaid Other | Admitting: Obstetrics and Gynecology

## 2014-10-01 ENCOUNTER — Encounter (HOSPITAL_COMMUNITY): Payer: Self-pay | Admitting: Obstetrics and Gynecology

## 2014-10-01 MED ORDER — FUROSEMIDE 10 MG/ML IJ SOLN
20.0000 mg | Freq: Once | INTRAMUSCULAR | Status: DC
  Filled 2014-10-01: qty 2

## 2014-10-01 MED ORDER — OXYCODONE-ACETAMINOPHEN 5-325 MG PO TABS: 1.0000 | ORAL_TABLET | ORAL | Status: DC | PRN

## 2014-10-01 MED ORDER — FUROSEMIDE 20 MG PO TABS
20.0000 mg | ORAL_TABLET | Freq: Once | ORAL | Status: AC
Start: 2014-10-01 — End: 2014-10-01
  Administered 2014-10-01: 20 mg via ORAL
  Filled 2014-10-01: qty 1

## 2014-10-01 NOTE — Discharge Summary (Signed)
  Obstetric Discharge Summary Reason for Admission: induction of labor secondary to gestational hypertension Prenatal Procedures: none Intrapartum Procedures: cesarean: low cervical, transverse Postpartum Procedures: none Complications-Operative and Postpartum: none  Delivery Note At 3:02 AM a viable female was delivered via C-Section, Low Transverse secondary to CPD, malpresentation OP and maternal exhaustion (Presentation: ; Occiput Posterior).  APGAR: 8, 9; weight 7 lb 4.8 oz (3311 g).   Placenta status: Intact, Manual removal.  Cord: 3 vessels with the following complications: None   Mom to postpartum.  Baby to Couplet care / Skin to Skin.  Melissa Livingston 10/01/2014, 8:45 AM     Hospital Course:  Active Problems:   Gestational hypertension w/o significant proteinuria in 3rd trimester   Today: No acute events overnight.  Pt denies problems with ambulating, voiding or po intake.  She denies nausea or vomiting.  Pain is well controlled.  Plan for birth control is  depo.  Method of Feeding: breast  Melissa Livingston is a 18 y.o. G1P1001 s/p pLTCS. IOL secondary to gestational hypertension, once dilated to 10/100/+1 and pushed for 2+ hours patient requested cesarean section, OP presentation strongly suspected and noted on sono, declined vacuum. She has postpartum course that was uncomplicated including no problems with ambulating, PO intake, urination, pain, or bleeding. The pt feels ready to go home and  will be discharged with outpatient follow-up.    H/H: Lab Results  Component Value Date/Time   HGB 6.3* 09/30/2014  5:44 AM   HGB 9.3* 08/06/2014  9:57 AM   HCT 20.8* 09/30/2014  5:44 AM    Discharge Diagnoses: Term Pregnancy-delivered and gestational HTN  Discharge Information: Date: 10/01/2014 Activity: pelvic rest Diet: routine  Medications: Percocet Breast feeding:  Yes Condition: stable Instructions: refer to handout Discharge to: home      Medication List     ASK your doctor about these medications       acetaminophen 500 MG tablet  Commonly known as:  TYLENOL  Take 500 mg by mouth every 6 (six) hours as needed for headache.     butalbital-acetaminophen-caffeine 50-325-40 MG per tablet  Commonly known as:  FIORICET  Take 1 tablet by mouth every 4 (four) hours as needed for headache.     [employer] PRENATAL GUMMY PO  Take 2 each by mouth daily.     FUSION PLUS Caps  Take 1 capsule by mouth See admin instructions. 1 time daily between meals     pantoprazole 20 MG tablet  Commonly known as:  PROTONIX  Take 1 tablet (20 mg total) by mouth daily.     promethazine 12.5 MG tablet  Commonly known as:  PHENERGAN  Take 1-2 tablets (12.5-25 mg total) by mouth every 6 (six) hours as needed for nausea or vomiting.           Follow-up Information   Follow up with [facility] In 6 weeks.   Contact information:   [place] [address] [phone]      Merla Riches ,MD OB Fellow 10/01/2014,8:45 AM

## 2014-10-01 NOTE — Discharge Instructions (Signed)
Cesarean Delivery, Care After °Refer to this sheet in the next few weeks. These instructions provide you with information on caring for yourself after your procedure. Your health care provider may also give you specific instructions. Your treatment has been planned according to current medical practices, but problems sometimes occur. Call your health care provider if you have any problems or questions after you go home. °HOME CARE INSTRUCTIONS  °· Only take over-the-counter or prescription medications as directed by your health care provider. °· Do not drink alcohol, especially if you are breastfeeding or taking medication to relieve pain. °· Do not chew or smoke tobacco. °· Continue to use good perineal care. Good perineal care includes: °¨ Wiping your perineum from front to back. °¨ Keeping your perineum clean. °· Check your surgical cut (incision) daily for increased redness, drainage, swelling, or separation of skin. °· Clean your incision gently with soap and water every day, and then pat it dry. If your health care provider says it is okay, leave the incision uncovered. Use a bandage (dressing) if the incision is draining fluid or appears irritated. If the adhesive strips across the incision do not fall off within 7 days, carefully peel them off. °· Hug a pillow when coughing or sneezing until your incision is healed. This helps to relieve pain. °· Do not use tampons or douche until your health care provider says it is okay. °· Shower, wash your hair, and take tub baths as directed by your health care provider. °· Wear a well-fitting bra that provides breast support. °· Limit wearing support panties or control-top hose. °· Drink enough fluids to keep your urine clear or pale yellow. °· Eat high-fiber foods such as whole grain cereals and breads, brown rice, beans, and fresh fruits and vegetables every day. These foods may help prevent or relieve constipation. °· Resume activities such as climbing stairs,  driving, lifting, exercising, or traveling as directed by your health care provider. °· Talk to your health care provider about resuming sexual activities. This is dependent upon your risk of infection, your rate of healing, and your comfort and desire to resume sexual activity. °· Try to have someone help you with your household activities and your newborn for at least a few days after you leave the hospital. °· Rest as much as possible. Try to rest or take a nap when your newborn is sleeping. °· Increase your activities gradually. °· Keep all of your scheduled postpartum appointments. It is very important to keep your scheduled follow-up appointments. At these appointments, your health care provider will be checking to make sure that you are healing physically and emotionally. °SEEK MEDICAL CARE IF:  °· You are passing large clots from your vagina. Save any clots to show your health care provider. °· You have a foul smelling discharge from your vagina. °· You have trouble urinating. °· You are urinating frequently. °· You have pain when you urinate. °· You have a change in your bowel movements. °· You have increasing redness, pain, or swelling near your incision. °· You have pus draining from your incision. °· Your incision is separating. °· You have painful, hard, or reddened breasts. °· You have a severe headache. °· You have blurred vision or see spots. °· You feel sad or depressed. °· You have thoughts of hurting yourself or your newborn. °· You have questions about your care, the care of your newborn, or medications. °· You are dizzy or light-headed. °· You have a rash. °· You   have pain, redness, or swelling at the site of the removed intravenous access (IV) tube.  You have nausea or vomiting.  You stopped breastfeeding and have not had a menstrual period within 12 weeks of stopping.  You are not breastfeeding and have not had a menstrual period within 12 weeks of delivery.  You have a fever. SEEK  IMMEDIATE MEDICAL CARE IF:  You have persistent pain.  You have chest pain.  You have shortness of breath.  You faint.  You have leg pain.  You have stomach pain.  Your vaginal bleeding saturates 2 or more sanitary pads in 1 hour. MAKE SURE YOU:   Understand these instructions.  Will watch your condition.  Will get help right away if you are not doing well or get worse. Document Released: 08/15/2002 Document Revised: 04/09/2014 Document Reviewed: 07/20/2012 [facility] Patient Information 2015 [place], [place]. This information is not intended to replace advice given to you by your health care provider. Make sure you discuss any questions you have with your health care provider.    Preeclampsia and Eclampsia Preeclampsia is a serious condition that develops only during pregnancy. It is also called toxemia of pregnancy. This condition causes high blood pressure along with other symptoms, such as swelling and headaches. These may develop as the condition gets worse. Preeclampsia may occur 20 weeks or later into your pregnancy.  Diagnosing and treating preeclampsia early is very important. If not treated early, it can cause serious problems for you and your baby. One problem it can lead to is eclampsia, which is a condition that causes muscle jerking or shaking (convulsions) in the mother. Delivering your baby is the best treatment for preeclampsia or eclampsia.  RISK FACTORS The cause of preeclampsia is not known. You may be more likely to develop preeclampsia if you have certain risk factors. These include:   Being pregnant for the first time.  Having preeclampsia in a past pregnancy.  Having a family history of preeclampsia.  Having high blood pressure.  Being pregnant with twins or triplets.  Being 5 or older.  Being African American.  Having kidney disease or diabetes.  Having medical conditions such as lupus or blood diseases.  Being very overweight (obese). SIGNS  AND SYMPTOMS  The earliest signs of preeclampsia are:  High blood pressure.  Increased protein in your urine. Your health care provider will check for this at every prenatal visit. Other symptoms that can develop include:   Severe headaches.  Sudden weight gain.  Swelling of your hands, face, legs, and feet.  Feeling sick to your stomach (nauseous) and throwing up (vomiting).  Vision problems (blurred or double vision).  Numbness in your face, arms, legs, and feet.  Dizziness.  Slurred speech.  Sensitivity to bright lights.  Abdominal pain. DIAGNOSIS  There are no screening tests for preeclampsia. Your health care provider will ask you about symptoms and check for signs of preeclampsia during your prenatal visits. You may also have tests, including:  Urine testing.  Blood testing.  Checking your baby's heart rate.  Checking the health of your baby and your placenta using images created with sound waves (ultrasound). TREATMENT  You can work out the best treatment approach together with your health care provider. It is very important to keep all prenatal appointments. If you have an increased risk of preeclampsia, you may need more frequent prenatal exams.  Your health care provider may prescribe bed rest.  You may have to eat as little salt as possible.  You may need to take medicine to lower your blood pressure if the condition does not respond to more conservative measures.  You may need to stay in the hospital if your condition is severe. There, treatment will focus on controlling your blood pressure and fluid retention. You may also need to take medicine to prevent seizures.  If the condition gets worse, your baby may need to be delivered early to protect you and the baby. You may have your labor started with medicine (be induced), or you may have a cesarean delivery.  Preeclampsia usually goes away after the baby is born. HOME CARE INSTRUCTIONS   Only take  over-the-counter or prescription medicines as directed by your health care provider.  Lie on your left side while resting. This keeps pressure off your baby.  Elevate your feet while resting.  Get regular exercise. Ask your health care provider what type of exercise is safe for you.  Avoid caffeine and alcohol.  Do not smoke.  Drink 6-8 glasses of water every day.  Eat a balanced diet that is low in salt. Do not add salt to your food.  Avoid stressful situations as much as possible.  Get plenty of rest and sleep.  Keep all prenatal appointments and tests as scheduled. SEEK MEDICAL CARE IF:  You are gaining more weight than expected.  You have any headaches, abdominal pain, or nausea.  You are bruising more than usual.  You feel dizzy or light-headed. SEEK IMMEDIATE MEDICAL CARE IF:   You develop sudden or severe swelling anywhere in your body. This usually happens in the legs.  You gain 5 lb (2.3 kg) or more in a week.  You have a severe headache, dizziness, problems with your vision, or confusion.  You have severe abdominal pain.  You have lasting nausea or vomiting.  You have a seizure.  You have trouble moving any part of your body.  You develop numbness in your body.  You have trouble speaking.  You have any abnormal bleeding.  You develop a stiff neck.  You pass out. MAKE SURE YOU:   Understand these instructions.  Will watch your condition.  Will get help right away if you are not doing well or get worse. Document Released: 11/20/2000 Document Revised: 11/28/2013 Document Reviewed: 09/15/2013 [facility] Patient Information 2015 [place], [place]. This information is not intended to replace advice given to you by your health care provider. Make sure you discuss any questions you have with your health care provider.

## 2014-10-01 NOTE — Plan of Care (Signed)
Problem: Discharge Progression Outcomes Goal: Remove staples per MD order Outcome: Not Applicable Date Met:  96/29/52 Per OP note, incision is closed with sutures. No staples in use.

## 2014-10-04 NOTE — Discharge Summary (Signed)
Attestation of Attending Supervision of Advanced Practitioner (CNM/NP): Evaluation and management procedures were performed by the Advanced Practitioner under my supervision and collaboration.  I have reviewed the Advanced Practitioner's note and chart, and I agree with the management and plan.  Gagandeep Kossman 10/04/2014 10:23 AM

## 2014-10-05 NOTE — H&P (Signed)
Melissa Livingston is a 18 y.o. female G1P0 with IUP at [redacted]w[redacted]d presenting for IOL for GHTN. She has had labile blood pressures for a few months, such as 160/88 (at 33 weeks) usually in the 120's/70-80 range.  Yesterday, she had 140/92 in the office, and was asked to return today for preeclampsia labs.  She was noted to again, have elevated BP, meeting criteria for GHTN.Marland Kitchen  She has had an off and on HA for 2 months, has one today. C/O dizziness and seeing spots, neither which are new sx. However, because she is > 39 weeks, there is not much to be gained by keeping her pregnant..  There is active fetal movement.  [facility] at [facility]  since 9 wks  Prenatal History/Complications:  as above  Past Medical History: Past Medical History  Diagnosis Date  . Asthma     Past Surgical History: Past Surgical History  Procedure Laterality Date  . None    . Esophagogastroduodenoscopy (egd) with esophageal dilation N/A 07/19/2013    Procedure: ESOPHAGOGASTRODUODENOSCOPY (EGD) WITH ESOPHAGEAL DILATION;  Surgeon: Daneil Dolin, MD;  [facility];  Service: Endoscopy;  Laterality: N/A;  2:45  . Cesarean section N/A 09/29/2014    Procedure: CESAREAN SECTION;  Surgeon: Jonnie Kind, MD;  Location: [place] [facility];  Service: Obstetrics;  Laterality: N/A;    Obstetrical History: OB History as of 10/01/14   Grav Para Term Preterm Abortions TAB SAB Ect Mult Living   1 1 1       1        Social History: History   Social History  . Marital Status: Single    Spouse Name: N/A    Number of Children: 0  . Years of Education: N/A   Occupational History  . out due to knee   .  [employer]   Social History Main Topics  . Smoking status: Never Smoker   . Smokeless tobacco: Never Used  . Alcohol Use: No  . Drug Use: No  . Sexual Activity: Yes    Birth Control/ Protection: None   Other Topics Concern  . None   Social History Narrative  . None    Family History: Family History  Problem Relation Age of  Onset  . Multiple sclerosis Mother   . Colon cancer Other     maternal great uncle, age greater than 72  . Heart disease Maternal Grandfather   . Heart disease Paternal Uncle   . Diabetes Paternal Uncle   . Hypertension Paternal Uncle     Allergies: Allergies  Allergen Reactions  . Lorabid [Loracarbef] Hives and Swelling  . Latex Itching and Rash  . Orange Fruit [Citrus] Rash    No prescriptions prior to admission     Prenatal Transfer Tool  Maternal Diabetes: No Genetic Screening: Normal Maternal Ultrasounds/Referrals: Normal Fetal Ultrasounds or other Referrals:  None Maternal Substance Abuse:  No Significant Maternal Medications:  None Significant Maternal Lab Results: Lab values include: Group B Strep negative     Review of Systems   Constitutional: Negative for fever, chills, weight loss, malaise/fatigue and diaphoresis.  HENT: Negative for hearing loss, ear pain, nosebleeds, congestion, sore throat, neck pain, tinnitus and ear discharge.   Eyes: Negative for blurred vision, double vision, photophobia, pain, discharge and redness.  Respiratory: Negative for cough, hemoptysis, sputum production, shortness of breath, wheezing and stridor.   Cardiovascular: Negative for chest pain, palpitations, orthopnea,  leg swelling  Gastrointestinal: Positive for abdominal pain. Negative for heartburn,  nausea, vomiting, diarrhea, constipation, blood in stool Genitourinary: Negative for dysuria, urgency, frequency, hematuria and flank pain.  Musculoskeletal: Negative for myalgias, back pain, joint pain and falls.  Skin: Negative for itching and rash.  Neurological: Negative for dizziness, tingling, tremors, sensory change, speech change, focal weakness, seizures, loss of consciousness, weakness and headaches.  Endo/Heme/Allergies: Negative for environmental allergies and polydipsia. Does not bruise/bleed easily.  Psychiatric/Behavioral: Negative for depression, suicidal ideas,  hallucinations, memory loss and substance abuse. The patient is not nervous/anxious and does not have insomnia.       Blood pressure 136/68, pulse 113, temperature 98.1 F (36.7 C), temperature source Oral, resp. rate 20, height 5\' 1"  (1.549 m), weight 193 lb (87.544 kg), last menstrual period 07/18/2013, SpO2 100.00%, unknown if currently breastfeeding. General appearance: alert, appears stated age and no distress Lungs: clear to auscultation bilaterally Heart: regular rate and rhythm Abdomen: soft, non-tender; bowel sounds normal Pelvic: 1-2/50/-2 Extremities: Homans sign is negative, no sign of DVT DTR's 3+ Presentation: cephalic Fetal monitoringBaseline: 150 bpm, Variability: Good {> 6 bpm), Accelerations: Reactive and Decelerations: Absent Uterine activity  Irregular and mild  Dilation: 10 Effacement (%): 100 Station: 0 Exam by:: Deniece Ree MD   Prenatal labs: ABO, Rh: --/--/O POS, O POS (10/22 2035) Antibody: NEG (10/22 2035) Rubella:  immune RPR: NON REAC (10/22 2035)  HBsAg: NEGATIVE (03/24 1140)  HIV: NONREACTIVE (08/03 0000)  GBS: NOT DETECTED (10/05 1425)  1 hr Glucola  74/112/79 Genetic screening  normal Anatomy US normal   No results found for this or any previous visit (from the past 24 hour(s)).  Assessment: Melissa Livingston is a 18 y.o. G1P0 with an IUP at [redacted]w[redacted]d presenting for IOL for GHTN  Plan: #Labor: Foley placed in cervix and inflated with 60cc H20.  Will start pitocin when foley falls out #Pain:  epidural #FWB Cat 1 #ID: GBS: negative  #MOF:  breast #MOC: depo #[facility]: at [facility]  09/27/2014, 9:46 PM   Attestation of Attending Supervision of Advanced Practitioner (PA/CNM/NP): Evaluation and management procedures were performed by the Advanced Practitioner under my supervision and collaboration.  I have reviewed the Advanced Practitioner's note and chart, and I agree with the management and plan.  Verita Schneiders, MD,  [place] Attending [place], [facility]

## 2014-10-08 ENCOUNTER — Encounter (HOSPITAL_COMMUNITY): Payer: Self-pay | Admitting: Obstetrics and Gynecology

## 2014-10-08 ENCOUNTER — Telehealth: Payer: Self-pay | Admitting: *Deleted

## 2014-10-08 NOTE — Telephone Encounter (Signed)
Spoke with Melissa Livingston at [facility]. Pt's hemoglobin today was 8.8. Pt had a c section 09/29/14. Pt is taking her Prenatal Vit daily. I spoke with Maudie Mercury, CNM and she said pt was prescribed iron, so she needs to be taking that daily. Also, eat green leafy veg and red meat. Left message. [place]

## 2014-10-09 NOTE — Telephone Encounter (Signed)
Left message x 2. JSY 

## 2014-10-09 NOTE — Telephone Encounter (Signed)
Spoke with pt letting her know hgb was low at 8.8. I advised to take Prenatal vit daily and iron daily. Pt hasn't been taking the iron. I also advised to eat red meat and green leafy veg. Pt is due to come back in December. I advised to call sooner with any problems. Pt voiced understanding. [place]

## 2014-11-07 ENCOUNTER — Encounter: Payer: Self-pay | Admitting: Women's Health

## 2014-11-07 ENCOUNTER — Ambulatory Visit (INDEPENDENT_AMBULATORY_CARE_PROVIDER_SITE_OTHER): Payer: Medicaid Other | Admitting: Women's Health

## 2014-11-07 DIAGNOSIS — Z30011 Encounter for initial prescription of contraceptive pills: Secondary | ICD-10-CM

## 2014-11-07 DIAGNOSIS — Z3202 Encounter for pregnancy test, result negative: Secondary | ICD-10-CM

## 2014-11-07 LAB — POCT URINE PREGNANCY: Preg Test, Ur: NEGATIVE

## 2014-11-07 MED ORDER — NORETHIN-ETH ESTRAD-FE BIPHAS 1 MG-10 MCG / 10 MCG PO TABS: 1.0000 | ORAL_TABLET | Freq: Every day | ORAL | Status: DC

## 2014-11-07 NOTE — Patient Instructions (Signed)
Use condoms for 2 weeks after starting pills  Oral Contraception Use Oral contraceptive pills (OCPs) are medicines taken to prevent pregnancy. OCPs work by preventing the ovaries from releasing eggs. The hormones in OCPs also cause the cervical mucus to thicken, preventing the sperm from entering the uterus. The hormones also cause the uterine lining to become thin, not allowing a fertilized egg to attach to the inside of the uterus. OCPs are highly effective when taken exactly as prescribed. However, OCPs do not prevent sexually transmitted diseases (STDs). Safe sex practices, such as using condoms along with an OCP, can help prevent STDs. Before taking OCPs, you may have a physical exam and Pap test. Your health care provider may also order blood tests if necessary. Your health care provider will make sure you are a good candidate for oral contraception. Discuss with your health care provider the possible side effects of the OCP you may be prescribed. When starting an OCP, it can take 2 to 3 months for the body to adjust to the changes in hormone levels in your body.  HOW TO TAKE ORAL CONTRACEPTIVE PILLS Your health care provider may advise you on how to start taking the first cycle of OCPs. Otherwise, you can:   Start on day 1 of your menstrual period. You will not need any backup contraceptive protection with this start time.   Start on the first Sunday after your menstrual period or the day you get your prescription. In these cases, you will need to use backup contraceptive protection for the first week.   Start the pill at any time of your cycle. If you take the pill within 5 days of the start of your period, you are protected against pregnancy right away. In this case, you will not need a backup form of birth control. If you start at any other time of your menstrual cycle, you will need to use another form of birth control for 7 days. If your OCP is the type called a minipill, it will protect  you from pregnancy after taking it for 2 days (48 hours). After you have started taking OCPs:   If you forget to take 1 pill, take it as soon as you remember. Take the next pill at the regular time.   If you miss 2 or more pills, call your health care provider because different pills have different instructions for missed doses. Use backup birth control until your next menstrual period starts.   If you use a 28-day pack that contains inactive pills and you miss 1 of the last 7 pills (pills with no hormones), it will not matter. Throw away the rest of the non-hormone pills and start a new pill pack.  No matter which day you start the OCP, you will always start a new pack on that same day of the week. Have an extra pack of OCPs and a backup contraceptive method available in case you miss some pills or lose your OCP pack.  HOME CARE INSTRUCTIONS   Do not smoke.   Always use a condom to protect against STDs. OCPs do not protect against STDs.   Use a calendar to mark your menstrual period days.   Read the information and directions that came with your OCP. Talk to your health care provider if you have questions.  SEEK MEDICAL CARE IF:   You develop nausea and vomiting.   You have abnormal vaginal discharge or bleeding.   You develop a rash.   You  miss your menstrual period.   You are losing your hair.   You need treatment for mood swings or depression.   You get dizzy when taking the OCP.   You develop acne from taking the OCP.   You become pregnant.  SEEK IMMEDIATE MEDICAL CARE IF:   You develop chest pain.   You develop shortness of breath.   You have an uncontrolled or severe headache.   You develop numbness or slurred speech.   You develop visual problems.   You develop pain, redness, and swelling in the legs.  Document Released: 11/12/2011 Document Revised: 04/09/2014 Document Reviewed: 05/14/2013 [facility] Patient Information 2015 [place],  [place]. This information is not intended to replace advice given to you by your health care provider. Make sure you discuss any questions you have with your health care provider.

## 2014-11-07 NOTE — Progress Notes (Signed)
Patient ID: [name], female   DOB: 1996-01-04, 18 y.o.   MRN: [id number] Subjective:    Melissa Livingston is a 18 y.o. G61P1001 African American female who presents for a postpartum visit. She is 5 weeks postpartum following a primary cesarean section, low transverse incision at 39.6 gestational weeks after failed IOL for GHTN. Arrest of descent after 1.5hrs pushing, OP position, and maternal exhaustion. Anesthesia: epidural. I have fully reviewed the prenatal and intrapartum course. Postpartum course has been uncomplicated. Baby's course has been uncomplicated. Baby is feeding by bottle. Bleeding thin lochia. Bowel function is normal. Bladder function is normal. Patient is sexually active. Last sexual activity: 11/21. Contraception method is none and desires COCs. Does not smoke, no h/o HTN, DVT/PE, CVA, MI, or migraines w/ aura. Postpartum depression screening: negative. Score 3.  Last pap <21yo.  The following portions of the patient's history were reviewed and updated as appropriate: allergies, current medications, past medical history, past surgical history and problem list.  Review of Systems Pertinent items are noted in HPI.   Filed Vitals:   11/07/14 1354  BP: 122/70  Height: 5' 2.75" (1.594 m)  Weight: 170 lb (77.111 kg)   Patient's last menstrual period was 11/03/2014.  Objective:   General:  alert, cooperative and no distress   Breasts:  deferred, no complaints  Lungs: clear to auscultation bilaterally  Heart:  regular rate and rhythm  Abdomen: soft, nontender, incision well- healed   Vulva: normal  Vagina: normal vagina  Cervix:  closed  Corpus: Well-involuted  Adnexa:  Non-palpable  Rectal Exam: No hemorrhoids        Results for orders placed or performed in visit on 11/07/14 (from the past 24 hour(s))  POCT urine pregnancy     Status: None   Collection Time: 11/07/14  2:02 PM  Result Value Ref Range   Preg Test, Ur Negative     Assessment:   Postpartum exam 5 wks s/p  PLTCS after IOL for GHTN Bottlefeeding Depression screening Contraception counseling   Plan:   Contraception: rx Lo Loestrin w/ 11RF, condoms x 2wks Follow up in: 3 months for coc f/u or earlier if needed  Tawnya Crook CNM, [facility] 11/07/2014 2:07 PM

## 2014-11-13 ENCOUNTER — Other Ambulatory Visit (HOSPITAL_COMMUNITY): Payer: Self-pay | Admitting: Sports Medicine

## 2014-11-13 DIAGNOSIS — S83005D Unspecified dislocation of left patella, subsequent encounter: Secondary | ICD-10-CM

## 2014-11-13 DIAGNOSIS — M25562 Pain in left knee: Secondary | ICD-10-CM

## 2014-11-16 ENCOUNTER — Ambulatory Visit (HOSPITAL_COMMUNITY)
Admission: RE | Admit: 2014-11-16 | Discharge: 2014-11-16 | Disposition: A | Discharge status: Home / Self Care | Payer: 59 | Source: Ambulatory Visit | Attending: Sports Medicine | Admitting: Sports Medicine

## 2014-11-16 DIAGNOSIS — M25562 Pain in left knee: Secondary | ICD-10-CM

## 2014-11-16 DIAGNOSIS — R937 Abnormal findings on diagnostic imaging of other parts of musculoskeletal system: Secondary | ICD-10-CM | POA: Diagnosis not present

## 2014-11-16 DIAGNOSIS — S83005D Unspecified dislocation of left patella, subsequent encounter: Secondary | ICD-10-CM

## 2014-11-26 ENCOUNTER — Ambulatory Visit (INDEPENDENT_AMBULATORY_CARE_PROVIDER_SITE_OTHER): Payer: Medicaid Other | Admitting: Women's Health

## 2014-11-26 ENCOUNTER — Ambulatory Visit: Payer: Medicaid Other | Admitting: Women's Health

## 2014-11-26 ENCOUNTER — Encounter: Payer: Self-pay | Admitting: Women's Health

## 2014-11-26 VITALS — BP 130/80 | Wt 178.0 lb

## 2014-11-26 DIAGNOSIS — Z30430 Encounter for insertion of intrauterine contraceptive device: Secondary | ICD-10-CM

## 2014-11-26 DIAGNOSIS — Z3202 Encounter for pregnancy test, result negative: Secondary | ICD-10-CM

## 2014-11-26 LAB — POCT URINE PREGNANCY: PREG TEST UR: NEGATIVE

## 2014-11-26 NOTE — Progress Notes (Signed)
Patient ID: [name], female   DOB: 1996-10-04, 18 y.o.   MRN: [id number] [place] Tsou is a 18 y.o. year old G61P1001 African American female, 8wks s/p PLTCS, who presents for placement of a Liletta IUD. She had been placed on Lo Loestrin at her pp visit, but states she had a deal w/ her partner that if she missed any pills, she would get an iud- and she missed a pill.   Patient's last menstrual period was 11/16/2014. BP 130/80 mmHg  Wt 178 lb (80.74 kg)  LMP 11/16/2014 Last sexual intercourse was >2wks ago, and pregnancy test today was neg  The risks and benefits of the method and placement have been thouroughly reviewed with the patient and all questions were answered.  Specifically the patient is aware of failure rate of 12/998, expulsion of the IUD and of possible perforation.  The patient is aware of irregular bleeding due to the method and understands the incidence of irregular bleeding diminishes with time.  Signed copy of informed consent in chart.  She understands the [place] is currently marketed for 3 years, but may be approved for 5 year and possibly 7 year use during the lifetime of her Liletta.  Time out was performed.  A graves speculum was placed in the vagina.  The cervix was visualized, prepped using Betadine, and grasped with a single tooth tenaculum. The uterus was found to be anteroflexed and it sounded to 7 cm.  Liletta IUD placed per manufacturer's recommendations.   The strings were trimmed to 3 cm.  Sonogram was performed and the proper placement of the IUD was verified via transvaginal u/s by tasha, ultrasonographer.   The patient was given post procedure instructions, including signs and symptoms of infection and to check for the strings after each menses or each month, and refraining from intercourse or anything in the vagina for 3 days.  She was given a [place] care card with date Liletta placed, and date Liletta to be removed.  She is scheduled for a f/u appointment  in 4 weeks.  Tawnya Crook CNM, [facility] 11/26/2014 3:46 PM

## 2014-11-26 NOTE — Patient Instructions (Signed)
Nothing in vagina for 3 days (no sex, douching, tampons, etc...)  Check your strings once a month to make sure you can feel them, if you are not able to please let us know  If you develop a fever of 100.4 or more in the next few weeks, or if you develop severe abdominal pain, please let [place] know  Use a backup method of birth control, such as condoms, for 2 weeks   Intrauterine Device Insertion, Care After Refer to this sheet in the next few weeks. These instructions provide you with information on caring for yourself after your procedure. Your health care provider may also give you more specific instructions. Your treatment has been planned according to current medical practices, but problems sometimes occur. Call your health care provider if you have any problems or questions after your procedure. WHAT TO EXPECT AFTER THE PROCEDURE Insertion of the IUD may cause some discomfort, such as cramping. The cramping should improve after the IUD is in place. You may have bleeding after the procedure. This is normal. It varies from light spotting for a few days to menstrual-like bleeding. When the IUD is in place, a string will extend past the cervix into the vagina for 1-2 inches. The strings should not bother you or your partner. If they do, talk to your health care provider.  HOME CARE INSTRUCTIONS   Check your intrauterine device (IUD) to make sure it is in place before you resume sexual activity. You should be able to feel the strings. If you cannot feel the strings, something may be wrong. The IUD may have fallen out of the uterus, or the uterus may have been punctured (perforated) during placement. Also, if the strings are getting longer, it may mean that the IUD is being forced out of the uterus. You no longer have full protection from pregnancy if any of these problems occur.  You may resume sexual intercourse if you are not having problems with the IUD. The copper IUD is considered immediately  effective, and the hormone IUD works right away if inserted within 7 days of your period starting. You will need to use a backup method of birth control for 7 days if the IUD in inserted at any other time in your cycle.  Continue to check that the IUD is still in place by feeling for the strings after every menstrual period.  You may need to take pain medicine such as acetaminophen or ibuprofen. Only take medicines as directed by your health care provider. SEEK MEDICAL CARE IF:   You have bleeding that is heavier or lasts longer than a normal menstrual cycle.  You have a fever.  You have increasing cramps or abdominal pain not relieved with medicine.  You have abdominal pain that does not seem to be related to the same area of earlier cramping and pain.  You are lightheaded, unusually weak, or faint.  You have abnormal vaginal discharge or smells.  You have pain during sexual intercourse.  You cannot feel the IUD strings, or the IUD string has gotten longer.  You feel the IUD at the opening of the cervix in the vagina.  You think you are pregnant, or you miss your menstrual period.  The IUD string is hurting your sex partner. MAKE SURE YOU:  Understand these instructions.  Will watch your condition.  Will get help right away if you are not doing well or get worse. Document Released: 07/22/2011 Document Revised: 09/13/2013 Document Reviewed: 05/14/2013 [employer]  Patient Information 2015 [employer]. This information is not intended to replace advice given to you by your health care provider. Make sure you discuss any questions you have with your health care provider.  

## 2014-12-24 ENCOUNTER — Ambulatory Visit (INDEPENDENT_AMBULATORY_CARE_PROVIDER_SITE_OTHER): Payer: Medicaid Other | Admitting: Women's Health

## 2014-12-24 ENCOUNTER — Encounter: Payer: Self-pay | Admitting: Women's Health

## 2014-12-24 VITALS — BP 124/76 | Ht 62.0 in | Wt 169.0 lb

## 2014-12-24 DIAGNOSIS — T8332XA Displacement of intrauterine contraceptive device, initial encounter: Secondary | ICD-10-CM

## 2014-12-24 DIAGNOSIS — Z30431 Encounter for routine checking of intrauterine contraceptive device: Secondary | ICD-10-CM

## 2014-12-24 MED ORDER — DOXYCYCLINE HYCLATE 100 MG PO CAPS: 100.0000 mg | ORAL_CAPSULE | Freq: Two times a day (BID) | ORAL | Status: DC

## 2014-12-24 NOTE — Patient Instructions (Signed)
Feel for your IUD strings frequently, if you feel them getting longer, feel something hard at your cervix, or don't feel any strings at all, please let us know  Pap smear in May

## 2014-12-24 NOTE — Progress Notes (Signed)
Patient ID: [name], female   DOB: Jan 11, 1996, 19 y.o.   MRN: [id number]   [facility] Visit  Patient name: Melissa Livingston MRN [id number]  Date of birth: 1996-04-19  CC & HPI:  Melissa Livingston is a 19 y.o. African American female presenting today for scheduled 4wk post-insertion IUD check. Wasn't able to feel strings when she tried. Had a light period on 1/13. No constipation/straining w/ bm's. Bottlefeeding.   Pertinent History Reviewed:  Medical & Surgical Hx:   Past Medical History  Diagnosis Date  . Asthma    Past Surgical History  Procedure Laterality Date  . None    . Esophagogastroduodenoscopy (egd) with esophageal dilation N/A 07/19/2013    Procedure: ESOPHAGOGASTRODUODENOSCOPY (EGD) WITH ESOPHAGEAL DILATION;  Surgeon: Daneil Dolin, MD;  [facility];  Service: Endoscopy;  Laterality: N/A;  2:45  . Cesarean section N/A 09/29/2014    Procedure: CESAREAN SECTION;  Surgeon: Jonnie Kind, MD;  Location: [place] [facility];  Service: Obstetrics;  Laterality: N/A;   Medications: Reviewed & Updated - see associated section Social History: Reviewed -  reports that she has never smoked. She has never used smokeless tobacco.  Objective Findings:  Vitals: BP 124/76 mmHg  Ht 5\' 2"  (1.575 m)  Wt 169 lb (76.658 kg)  BMI 30.90 kg/m2  LMP 12/19/2014  Breastfeeding? No  Physical Examination: General appearance - alert, well appearing, and in no distress Pelvic - Liletta strings visible & long, bottom of IUD visible at os. Cervix prepped with betadine, single tooth tenaculum placed, and IUD gently advanced to uterine fundus w/ [place] forceps w/o difficulty, pt tolerated well. Correct placement verified via transvaginal u/s by Aniceto Boss, ultrasonographer  No results found for this or any previous visit (from the past 24 hour(s)).   Assessment & Plan:  A:   IUD check, malpositioned, now in correct placment  P:  Rx doxycycline 100mg  bid x 10d prophylactically   Frequent string checks,  if strings feel longer, feel IUD at cervix, or feel no strings at all, let us know  F/U May when turns 19yo for pap & physical   Tawnya Crook CNM, [facility] 12/24/2014 3:24 PM

## 2015-01-11 ENCOUNTER — Ambulatory Visit (INDEPENDENT_AMBULATORY_CARE_PROVIDER_SITE_OTHER): Payer: Medicaid Other | Admitting: Adult Health

## 2015-01-11 ENCOUNTER — Encounter: Payer: Self-pay | Admitting: Adult Health

## 2015-01-11 VITALS — BP 140/76 | Temp 97.7°F | Ht 62.75 in | Wt 169.0 lb

## 2015-01-11 DIAGNOSIS — R112 Nausea with vomiting, unspecified: Secondary | ICD-10-CM | POA: Insufficient documentation

## 2015-01-11 DIAGNOSIS — N719 Inflammatory disease of uterus, unspecified: Secondary | ICD-10-CM

## 2015-01-11 DIAGNOSIS — R102 Pelvic and perineal pain: Secondary | ICD-10-CM | POA: Insufficient documentation

## 2015-01-11 HISTORY — DX: Nausea with vomiting, unspecified: R11.2

## 2015-01-11 HISTORY — DX: Pelvic and perineal pain: R10.2

## 2015-01-11 HISTORY — DX: Inflammatory disease of uterus, unspecified: N71.9

## 2015-01-11 LAB — POCT WET PREP (WET MOUNT): Trichomonas Wet Prep HPF POC: NEGATIVE

## 2015-01-11 MED ORDER — METRONIDAZOLE 500 MG PO TABS: 500.0000 mg | ORAL_TABLET | Freq: Two times a day (BID) | ORAL | Status: DC

## 2015-01-11 MED ORDER — CIPROFLOXACIN HCL 500 MG PO TABS: 500.0000 mg | ORAL_TABLET | Freq: Two times a day (BID) | ORAL | Status: DC

## 2015-01-11 MED ORDER — HYDROCODONE-ACETAMINOPHEN 5-325 MG PO TABS: 1.0000 | ORAL_TABLET | ORAL | Status: DC | PRN

## 2015-01-11 NOTE — Patient Instructions (Signed)
Endometritis Endometritis is an irritation, soreness, and swelling (inflammation) of the lining of the uterus (endometrium).  CAUSES   Bacterial infections.  Sexually transmitted infections (STIs).  Having a miscarriage or childbirth, especially after a long labor or cesarean delivery.  Certain gynecological procedures (such as dilation and curettage, hysteroscopy, or contraceptive insertion). SIGNS AND SYMPTOMS   Fever.  Lower abdominal or pelvic pain.  Abnormal vaginal discharge or bleeding.  Abdominal bloating (distention) or swelling.  General discomfort or ill feeling.  Discomfort with bowel movements. DIAGNOSIS  A physical and pelvic exam are performed. Other tests may include:  Cultures from the cervix.  Blood tests.  Examining a tissue sample of the uterine lining (endometrial biopsy).  Examining discharge under a microscope (wet prep).  Laparoscopy. TREATMENT  Antibiotic medicines are usually given. Other treatments may include:  Fluids through an IV tube inserted in your vein.  Rest. HOME CARE INSTRUCTIONS   Take over-the-counter or prescription medicines for pain, discomfort, or fever as directed by your health care provider.  Take your antibiotics as directed. Finish them even if you start to feel better.  Resume your normal diet and activities as directed or as tolerated.  Do not douche or have sexual intercourse until your health care provider says it is okay.  Do not have sexual intercourse until your partner has been treated if your endometritis is caused by an STI. SEEK IMMEDIATE MEDICAL CARE IF:   You have swelling or increasing pain in the abdomen.  You have a fever.  You have bad smelling vaginal discharge, or you have an increased amount of discharge.  You have abnormal vaginal bleeding.  Your medicine is not helping with the pain.  You experience any problems that may be related to the medicine you are taking.  You have nausea  and vomiting, or you cannot keep foods down.  You have pain with bowel movements. MAKE SURE YOU:   Understand these instructions.  Will watch your condition.  Will get help right away if you are not doing well or get worse. Document Released: 11/17/2001 Document Revised: 07/26/2013 Document Reviewed: 06/22/2013 [facility] Patient Information 2015 [place], [place]. This information is not intended to replace advice given to you by your health care provider. Make sure you discuss any questions you have with your health care provider. Pelvic Pain Female pelvic pain can be caused by many different things and start from a variety of places. Pelvic pain refers to pain that is located in the lower half of the abdomen and between your hips. The pain may occur over a short period of time (acute) or may be reoccurring (chronic). The cause of pelvic pain may be related to disorders affecting the female reproductive organs (gynecologic), but it may also be related to the bladder, kidney stones, an intestinal complication, or muscle or skeletal problems. Getting help right away for pelvic pain is important, especially if there has been severe, sharp, or a sudden onset of unusual pain. It is also important to get help right away because some types of pelvic pain can be life threatening.  CAUSES  Below are only some of the causes of pelvic pain. The causes of pelvic pain can be in one of several categories.   Gynecologic.  Pelvic inflammatory disease.  Sexually transmitted infection.  Ovarian cyst or a twisted ovarian ligament (ovarian torsion).  Uterine lining that grows outside the uterus (endometriosis).  Fibroids, cysts, or tumors.  Ovulation.  Pregnancy.  Pregnancy that occurs outside the uterus (  ectopic pregnancy).  Miscarriage.  Labor.  Abruption of the placenta or ruptured uterus.  Infection.  Uterine infection (endometritis).  Bladder infection.  Diverticulitis.  Miscarriage  related to a uterine infection (septic abortion).  Bladder.  Inflammation of the bladder (cystitis).  Kidney stone(s).  Gastrointestinal.  Constipation.  Diverticulitis.  Neurologic.  Trauma.  Feeling pelvic pain because of mental or emotional causes (psychosomatic).  Cancers of the bowel or pelvis. EVALUATION  Your caregiver will want to take a careful history of your concerns. This includes recent changes in your health, a careful gynecologic history of your periods (menses), and a sexual history. Obtaining your family history and medical history is also important. Your caregiver may suggest a pelvic exam. A pelvic exam will help identify the location and severity of the pain. It also helps in the evaluation of which organ system may be involved. In order to identify the cause of the pelvic pain and be properly treated, your caregiver may order tests. These tests may include:   A pregnancy test.  Pelvic ultrasonography.  An X-ray exam of the abdomen.  A urinalysis or evaluation of vaginal discharge.  Blood tests. HOME CARE INSTRUCTIONS   Only take over-the-counter or prescription medicines for pain, discomfort, or fever as directed by your caregiver.   Rest as directed by your caregiver.   Eat a balanced diet.   Drink enough fluids to make your urine clear or pale yellow, or as directed.   Avoid sexual intercourse if it causes pain.   Apply warm or cold compresses to the lower abdomen depending on which one helps the pain.   Avoid stressful situations.   Keep a journal of your pelvic pain. Write down when it started, where the pain is located, and if there are things that seem to be associated with the pain, such as food or your menstrual cycle.  Follow up with your caregiver as directed.  SEEK MEDICAL CARE IF:  Your medicine does not help your pain.  You have abnormal vaginal discharge. SEEK IMMEDIATE MEDICAL CARE IF:   You have heavy bleeding  from the vagina.   Your pelvic pain increases.   You feel light-headed or faint.   You have chills.   You have pain with urination or blood in your urine.   You have uncontrolled diarrhea or vomiting.   You have a fever or persistent symptoms for more than 3 days.  You have a fever and your symptoms suddenly get worse.   You are being physically or sexually abused.  MAKE SURE YOU:  Understand these instructions.  Will watch your condition.  Will get help if you are not doing well or get worse. Document Released: 10/20/2004 Document Revised: 04/09/2014 Document Reviewed: 03/14/2012 [facility] Patient Information 2015 [place], [place]. This information is not intended to replace advice given to you by your health care provider. Make sure you discuss any questions you have with your health care provider. Take antibiotics Return in 3 days for follow up IF INCREASED pain go to ER

## 2015-01-11 NOTE — Progress Notes (Signed)
Subjective:     Patient ID: [name], female   DOB: 1996-09-29, 19 y.o.   MRN: [id number]  HPI Melissa Livingston is a 19 year old black in complaining of pelvic pain x 1.5 days, she says a 9 at times, and some nausea and vomiting, had fever at home.She thinks having reaction to IUD, she had it placed 11/26/14 and then saw Lou Cal 12/24/14 and had IUD pushed in further, and was treated with doxycycline for 10 days.Wants IUD removed.  Review of Systems  Patient denies any headaches, hearing loss, fatigue, blurred vision, shortness of breath, chest pain,  problems with bowel movements, urination, or intercourse, not having sex at present. No joint pain or mood swings. See HPI for positives Reviewed past medical,surgical, social and family history. Reviewed medications and allergies.     Objective:   Physical Exam BP 140/76 mmHg  Temp(Src) 97.7 F (36.5 C)  Ht 5' 2.75" (1.594 m)  Wt 169 lb (76.658 kg)  BMI 30.17 kg/m2  LMP 01/04/2015  Breastfeeding? No Skin warm and dry.Pelvic: external genitalia is normal in appearance no lesions, vagina: white discharge without odor,urethra has no lesions or masses noted, cervix: bulbous, mild CMT,no IUD strings seen today, uterus: normal size, shape and contour,  tender, no masses felt, adnexa: no masses, bilateral tenderness noted.No rebound tenderness noted. Bladder is non tender and no masses felt. Dr Elonda Husky in for co exam.He says wait to remove IUD has may have endometritis and need to treat that first.  Wet prep: + for few clue cells and + many WBCs. GC/CHL obtained.     Assessment:     Pelvic pain Endometritis Nausea and vomiting    Plan:    Take phenergan has some at home Check CBC with diff Check GC/CHL   Rx Cipro 500 mg #20 1 bid x 10 days Rx flagyl 500 mg #14 1 bid x 7 days Rx Norco 5-325 mg #30 1 every 4 hours prn pain no refills Follow up in 3 days, if pain increases go to ER Review handout on endometritis and pelvic pain

## 2015-01-12 LAB — CBC WITH DIFFERENTIAL/PLATELET
BASOS: 0 %
Basophils Absolute: 0 10*3/uL (ref 0.0–0.2)
Eos: 1 %
Eosinophils Absolute: 0.1 10*3/uL (ref 0.0–0.4)
HCT: 32.9 % — ABNORMAL LOW (ref 34.0–46.6)
Hemoglobin: 9.5 g/dL — ABNORMAL LOW (ref 11.1–15.9)
Immature Grans (Abs): 0 10*3/uL (ref 0.0–0.1)
Immature Granulocytes: 0 %
LYMPHS: 31 %
Lymphocytes Absolute: 1.6 10*3/uL (ref 0.7–3.1)
MCH: 19.5 pg — ABNORMAL LOW (ref 26.6–33.0)
MCHC: 28.9 g/dL — ABNORMAL LOW (ref 31.5–35.7)
MCV: 67 fL — ABNORMAL LOW (ref 79–97)
MONOS ABS: 0.6 10*3/uL (ref 0.1–0.9)
Monocytes: 11 %
Neutrophils Absolute: 2.9 10*3/uL (ref 1.4–7.0)
Neutrophils Relative %: 57 %
Platelets: 307 10*3/uL (ref 150–379)
RBC: 4.88 x10E6/uL (ref 3.77–5.28)
RDW: 18.3 % — AB (ref 12.3–15.4)
WBC: 5.2 10*3/uL (ref 3.4–10.8)

## 2015-01-14 ENCOUNTER — Ambulatory Visit: Payer: Medicaid Other | Admitting: Women's Health

## 2015-01-14 ENCOUNTER — Ambulatory Visit (INDEPENDENT_AMBULATORY_CARE_PROVIDER_SITE_OTHER): Payer: Medicaid Other | Admitting: Adult Health

## 2015-01-14 ENCOUNTER — Encounter: Payer: Self-pay | Admitting: Adult Health

## 2015-01-14 VITALS — BP 118/72 | Ht 62.75 in | Wt 171.0 lb

## 2015-01-14 DIAGNOSIS — N719 Inflammatory disease of uterus, unspecified: Secondary | ICD-10-CM

## 2015-01-14 DIAGNOSIS — R102 Pelvic and perineal pain unspecified side: Secondary | ICD-10-CM

## 2015-01-14 DIAGNOSIS — N83209 Unspecified ovarian cyst, unspecified side: Secondary | ICD-10-CM

## 2015-01-14 DIAGNOSIS — Z975 Presence of (intrauterine) contraceptive device: Secondary | ICD-10-CM

## 2015-01-14 DIAGNOSIS — N83201 Unspecified ovarian cyst, right side: Secondary | ICD-10-CM

## 2015-01-14 DIAGNOSIS — N8320 Unspecified ovarian cysts: Secondary | ICD-10-CM

## 2015-01-14 HISTORY — DX: Unspecified ovarian cyst, unspecified side: N83.209

## 2015-01-14 NOTE — Patient Instructions (Signed)
Ovarian Cyst An ovarian cyst is a fluid-filled sac that forms on an ovary. The ovaries are small organs that produce eggs in women. Various types of cysts can form on the ovaries. Most are not cancerous. Many do not cause problems, and they often go away on their own. Some may cause symptoms and require treatment. Common types of ovarian cysts include:  Functional cysts--These cysts may occur every month during the menstrual cycle. This is normal. The cysts usually go away with the next menstrual cycle if the woman does not get pregnant. Usually, there are no symptoms with a functional cyst.  Endometrioma cysts--These cysts form from the tissue that lines the uterus. They are also called "chocolate cysts" because they become filled with blood that turns brown. This type of cyst can cause pain in the lower abdomen during intercourse and with your menstrual period.  Cystadenoma cysts--This type develops from the cells on the outside of the ovary. These cysts can get very big and cause lower abdomen pain and pain with intercourse. This type of cyst can twist on itself, cut off its blood supply, and cause severe pain. It can also easily rupture and cause a lot of pain.  Dermoid cysts--This type of cyst is sometimes found in both ovaries. These cysts may contain different kinds of body tissue, such as skin, teeth, hair, or cartilage. They usually do not cause symptoms unless they get very big.  Theca lutein cysts--These cysts occur when too much of a certain hormone (human chorionic gonadotropin) is produced and overstimulates the ovaries to produce an egg. This is most common after procedures used to assist with the conception of a baby (in vitro fertilization). CAUSES   Fertility drugs can cause a condition in which multiple large cysts are formed on the ovaries. This is called ovarian hyperstimulation syndrome.  A condition called polycystic ovary syndrome can cause hormonal imbalances that can lead to  nonfunctional ovarian cysts. SIGNS AND SYMPTOMS  Many ovarian cysts do not cause symptoms. If symptoms are present, they may include:  Pelvic pain or pressure.  Pain in the lower abdomen.  Pain during sexual intercourse.  Increasing girth (swelling) of the abdomen.  Abnormal menstrual periods.  Increasing pain with menstrual periods.  Stopping having menstrual periods without being pregnant. DIAGNOSIS  These cysts are commonly found during a routine or annual pelvic exam. Tests may be ordered to find out more about the cyst. These tests may include:  Ultrasound.  X-ray of the pelvis.  CT scan.  MRI.  Blood tests. TREATMENT  Many ovarian cysts go away on their own without treatment. Your health care provider may want to check your cyst regularly for 2-3 months to see if it changes. For women in menopause, it is particularly important to monitor a cyst closely because of the higher rate of ovarian cancer in menopausal women. When treatment is needed, it may include any of the following:  A procedure to drain the cyst (aspiration). This may be done using a long needle and ultrasound. It can also be done through a laparoscopic procedure. This involves using a thin, lighted tube with a tiny camera on the end (laparoscope) inserted through a small incision.  Surgery to remove the whole cyst. This may be done using laparoscopic surgery or an open surgery involving a larger incision in the lower abdomen.  Hormone treatment or birth control pills. These methods are sometimes used to help dissolve a cyst. HOME CARE INSTRUCTIONS   Only take over-the-counter   or prescription medicines as directed by your health care provider.  Follow up with your health care provider as directed.  Get regular pelvic exams and Pap tests. SEEK MEDICAL CARE IF:   Your periods are late, irregular, or painful, or they stop.  Your pelvic pain or abdominal pain does not go away.  Your abdomen becomes  larger or swollen.  You have pressure on your bladder or trouble emptying your bladder completely.  You have pain during sexual intercourse.  You have feelings of fullness, pressure, or discomfort in your stomach.  You lose weight for no apparent reason.  You feel generally ill.  You become constipated.  You lose your appetite.  You develop acne.  You have an increase in body and facial hair.  You are gaining weight, without changing your exercise and eating habits.  You think you are pregnant. SEEK IMMEDIATE MEDICAL CARE IF:   You have increasing abdominal pain.  You feel sick to your stomach (nauseous), and you throw up (vomit).  You develop a fever that comes on suddenly.  You have abdominal pain during a bowel movement.  Your menstrual periods become heavier than usual. MAKE SURE YOU:  Understand these instructions.  Will watch your condition.  Will get help right away if you are not doing well or get worse. Document Released: 11/23/2005 Document Revised: 11/28/2013 Document Reviewed: 07/31/2013 [facility] Patient Information 2015 [place], [place]. This information is not intended to replace advice given to you by your health care provider. Make sure you discuss any questions you have with your health care provider. [place] in 10 days then see me next day

## 2015-01-14 NOTE — Progress Notes (Signed)
Subjective:     Patient ID: [name], female   DOB: 19-Apr-1996, 19 y.o.   MRN: [id number]  HPI Melissa Livingston is a 19 year old black female back in follow up of pelvic pain,and endometritis. She is still having pelvic pain.WBC was normal on CBC.  Review of Systems  Patient denies any headaches, hearing loss, fatigue, blurred vision, shortness of breath, chest pain, problems with bowel movements, urination, or intercourse, not having sex. No joint pain or mood,  Swings, still has pelvic pain and abdominal pain. Reviewed past medical,surgical, social and family history. Reviewed medications and allergies.     Objective:   Physical Exam BP 118/72 mmHg  Ht 5' 2.75" (1.594 m)  Wt 171 lb (77.565 kg)  BMI 30.53 kg/m2  LMP 01/04/2015  Breastfeeding? No Skin warm and dry.Pelvic: external genitalia is normal in appearance no lesions, vagina: white discharge without odor,urethra has no lesions or masses noted, cervix:smooth and bulbous, some mild CMT, uterus: normal size, shape and contour,  tender, no masses felt, adnexa: no masses, bilateral tenderness noted.No rebound. Bladder is non tender and no masses.I did [place] and it shows right ovarian cyst and IUD is in fundal area, Dr Glo Herring in for co exam.Will get pt to finish meds and recheck in 10 days, she is aware anemeic and takes iron GC/CHl not back.Discussed ovarian cyst with her and partner.    Assessment:     Pelvic pain Right ovarian cyst Endometritis IUD in place    Plan:     Finish taking meds Increase water  No sex Return in 10 days for [place] and then see me next day Review handout on ovarian cyst

## 2015-01-15 ENCOUNTER — Ambulatory Visit: Payer: Medicaid Other | Admitting: Family Medicine

## 2015-01-15 ENCOUNTER — Telehealth: Payer: Self-pay | Admitting: *Deleted

## 2015-01-15 LAB — GC/CHLAMYDIA PROBE AMP
Chlamydia trachomatis, NAA: NEGATIVE
Neisseria gonorrhoeae by PCR: NEGATIVE

## 2015-01-15 NOTE — Telephone Encounter (Signed)
Spoke with pt. Pt is in pain from the cyst on ovary. Pt states pain med is not helping. I spoke with JAG. She advised to take 2 tabs at 1 time. Pt is due another dose at 3:45 pm. Pt to let us know how that controls the pain. Pt may have to see Dr. Elonda Husky or Dr. Glo Herring per [place]. [place]

## 2015-01-22 ENCOUNTER — Other Ambulatory Visit: Payer: Self-pay | Admitting: Adult Health

## 2015-01-22 DIAGNOSIS — Z975 Presence of (intrauterine) contraceptive device: Secondary | ICD-10-CM

## 2015-01-22 DIAGNOSIS — R102 Pelvic and perineal pain: Secondary | ICD-10-CM

## 2015-01-24 ENCOUNTER — Other Ambulatory Visit: Payer: Self-pay | Admitting: Adult Health

## 2015-01-24 ENCOUNTER — Ambulatory Visit (INDEPENDENT_AMBULATORY_CARE_PROVIDER_SITE_OTHER): Payer: Medicaid Other

## 2015-01-24 DIAGNOSIS — Z975 Presence of (intrauterine) contraceptive device: Secondary | ICD-10-CM

## 2015-01-24 DIAGNOSIS — R102 Pelvic and perineal pain: Secondary | ICD-10-CM

## 2015-01-28 ENCOUNTER — Encounter: Payer: Self-pay | Admitting: Adult Health

## 2015-01-28 ENCOUNTER — Ambulatory Visit (INDEPENDENT_AMBULATORY_CARE_PROVIDER_SITE_OTHER): Payer: Medicaid Other | Admitting: Adult Health

## 2015-01-28 VITALS — BP 110/70 | Ht 62.0 in | Wt 165.5 lb

## 2015-01-28 DIAGNOSIS — Z30013 Encounter for initial prescription of injectable contraceptive: Secondary | ICD-10-CM

## 2015-01-28 DIAGNOSIS — Z8742 Personal history of other diseases of the female genital tract: Secondary | ICD-10-CM

## 2015-01-28 DIAGNOSIS — R102 Pelvic and perineal pain: Secondary | ICD-10-CM

## 2015-01-28 HISTORY — DX: Personal history of other diseases of the female genital tract: Z87.42

## 2015-01-28 MED ORDER — MEDROXYPROGESTERONE ACETATE 150 MG/ML IM SUSP: 150.0000 mg | INTRAMUSCULAR | Status: DC

## 2015-01-28 NOTE — Patient Instructions (Signed)

## 2015-01-28 NOTE — Progress Notes (Signed)
Subjective:     Patient ID: [name], female   DOB: 02-08-1996, 19 y.o.   MRN: [id number]  HPI Melissa Livingston is a 19 year old black female in complaining of pelvic pain still,but not as bad, wants IUD out, history of ovarian cyst..Had [place] 2/18 and cyst has resolved.She wants to try depo.  Review of Systems  Patient denies any headaches, hearing loss, fatigue, blurred vision, shortness of breath, chest pain,  problems with bowel movements, urination, or intercourse(last sex in January). No joint pain or mood swings.See HPI for positives.  Reviewed past medical,surgical, social and family history. Reviewed medications and allergies.     Objective:   Physical Exam BP 110/70 mmHg  Ht 5\' 2"  (1.575 m)  Wt 165 lb 8 oz (75.07 kg)  BMI 30.26 kg/m2  LMP 01/18/2015  Breastfeeding? NoReviewed [place] with pt, cyst has resolved.   Uterus Anteverted 7.6 x 5.5 x 4.4 cm,   Endometrium IUD noted centrally within the edometrium   Right ovary 2.9 x 2.3 x 2.1 cm,   Left ovary 2.9 x 2.0 x 1.8 cm,   No free fluid or adnexal masses noted within the pelvis  Technician Comments:  Anteverted uterus, IUD located centrally within the endometrium, bilateral adnexa/ovaries appears WNL, no free fluid or adnexal masses noted within the pelvis  Consent signed for IUD removal,time out called. Skin warm and dry.Pelvic: external genitalia is normal in appearance no lesions, vagina: pink with good moisture, no discharge,urethra has no lesions or masses noted, cervix:smooth and bulbous, No IUD strings seen, tried to grasp stings with forceps could not, uterus: normal size, shape and contour, non tender, no masses felt, adnexa: no masses, still has some tenderness. Bladder is non tender and no masses felt.Will order depo and bring back tomorrow to get depo and see Dr Elonda Husky for IUD removal may need to inject cervix with lidocaine prior to removal.  Assessment:     Pelvic  pain History of ovarian cyst resolved Contraceptive management Failed IUD removal    Plan:     Rx Depo provera 150 mg, disp.# 1 vail for IM injection every 3 months in office with 3 refills Return in 1 day for depo and IUD removal with Dr Elonda Husky   Review handout on depo

## 2015-01-29 ENCOUNTER — Ambulatory Visit: Payer: Medicaid Other

## 2015-01-29 ENCOUNTER — Encounter: Payer: Self-pay | Admitting: Obstetrics & Gynecology

## 2015-01-29 ENCOUNTER — Ambulatory Visit (INDEPENDENT_AMBULATORY_CARE_PROVIDER_SITE_OTHER): Payer: Medicaid Other | Admitting: Obstetrics & Gynecology

## 2015-01-29 VITALS — BP 120/80 | HR 68 | Ht 62.0 in | Wt 165.5 lb

## 2015-01-29 DIAGNOSIS — Z30432 Encounter for removal of intrauterine contraceptive device: Secondary | ICD-10-CM

## 2015-01-29 NOTE — Progress Notes (Signed)
Patient ID: Melissa Livingston, female   DOB: 1996-09-07, 19 y.o.   MRN: [id number] Pt is seen in follow up from yesterdayfor:  IUD removal, could not see or grsp strings  Blood pressure 120/80, pulse 68, height 5\' 2"  (1.575 m), weight 165 lb 8 oz (75.07 kg), last menstrual period 01/18/2015, not currently breastfeeding.  Exam Cervix no strings visible Dilated with dilators to allow passage of IUD hook, could feel but not grab the IUD Used long curved Georgina Peer to remove without difficulty  Pt will start depo provera and follow up otherwise prn

## 2015-01-30 ENCOUNTER — Ambulatory Visit: Payer: Medicaid Other

## 2015-01-31 ENCOUNTER — Encounter: Payer: Self-pay | Admitting: *Deleted

## 2015-01-31 ENCOUNTER — Ambulatory Visit (INDEPENDENT_AMBULATORY_CARE_PROVIDER_SITE_OTHER): Payer: Medicaid Other | Admitting: *Deleted

## 2015-01-31 DIAGNOSIS — Z3042 Encounter for surveillance of injectable contraceptive: Secondary | ICD-10-CM

## 2015-01-31 DIAGNOSIS — Z3202 Encounter for pregnancy test, result negative: Secondary | ICD-10-CM

## 2015-01-31 LAB — POCT URINE PREGNANCY: Preg Test, Ur: NEGATIVE

## 2015-01-31 MED ORDER — MEDROXYPROGESTERONE ACETATE 150 MG/ML IM SUSP
150.0000 mg | Freq: Once | INTRAMUSCULAR | Status: AC
  Administered 2015-01-31: 150 mg via INTRAMUSCULAR

## 2015-01-31 NOTE — Progress Notes (Signed)
Pt here for Depo. Reports no problems at this time. Return in 12 weeks for next shot. JSY 

## 2015-02-06 ENCOUNTER — Ambulatory Visit: Payer: Medicaid Other | Admitting: Advanced Practice Midwife

## 2015-02-06 ENCOUNTER — Ambulatory Visit: Payer: Medicaid Other | Admitting: Women's Health

## 2015-02-06 ENCOUNTER — Encounter: Payer: Self-pay | Admitting: Advanced Practice Midwife

## 2015-02-07 ENCOUNTER — Ambulatory Visit: Payer: Medicaid Other | Admitting: Obstetrics & Gynecology

## 2015-02-11 ENCOUNTER — Ambulatory Visit (INDEPENDENT_AMBULATORY_CARE_PROVIDER_SITE_OTHER): Payer: Medicaid Other | Admitting: Family Medicine

## 2015-02-11 ENCOUNTER — Encounter: Payer: Self-pay | Admitting: Family Medicine

## 2015-02-11 VITALS — BP 128/88 | Ht 62.0 in | Wt 163.0 lb

## 2015-02-11 DIAGNOSIS — O139 Gestational [pregnancy-induced] hypertension without significant proteinuria, unspecified trimester: Secondary | ICD-10-CM | POA: Diagnosis not present

## 2015-02-11 DIAGNOSIS — R600 Localized edema: Secondary | ICD-10-CM

## 2015-02-11 LAB — POCT URINALYSIS DIPSTICK
Protein, UA: NEGATIVE
Spec Grav, UA: <=1.005
pH, UA: 6

## 2015-02-11 MED ORDER — HYDROCHLOROTHIAZIDE 25 MG PO TABS: ORAL_TABLET | ORAL | Status: DC

## 2015-02-11 MED ORDER — POTASSIUM CHLORIDE CRYS ER 20 MEQ PO TBCR: EXTENDED_RELEASE_TABLET | ORAL | Status: DC

## 2015-02-11 NOTE — Progress Notes (Signed)
   Subjective:    Patient ID: [name], female    DOB: April 03, 1996, 19 y.o.   MRN: [id number]  HPI Patient is here today d/t leg swelling.  She said her legs swell at the end of her work day. Pt is on her feet all day at work, then when she gets home, they are swollen. She elevates them, which helps.  Pt used to be on a fluid pill during pregnancy. After her rx ran out, that is when her legs began to swell again.  They are not pitting.     Review of Systems  Constitutional: Negative for activity change, appetite change and fatigue.  HENT: Negative for congestion.   Respiratory: Negative for cough.   Cardiovascular: Negative for chest pain.  Gastrointestinal: Negative for abdominal pain.  Endocrine: Negative for polydipsia and polyphagia.  Neurological: Negative for weakness.  Psychiatric/Behavioral: Negative for confusion.       Objective:   Physical Exam  Constitutional: She appears well-nourished. No distress.  Cardiovascular: Normal rate, regular rhythm and normal heart sounds.   No murmur heard. Pulmonary/Chest: Effort normal and breath sounds normal. No respiratory distress.  Musculoskeletal: She exhibits no edema.  Lymphadenopathy:    She has no cervical adenopathy.  Neurological: She is alert. She exhibits normal muscle tone.  Psychiatric: Her behavior is normal.  Vitals reviewed.    blood pressure checked twice 120/72 trace edema in the ankles. Urinalysis without protein.      Assessment & Plan:   pedal edema use diuretics when necessary along with potassium No sign of HTN currently.  heart healthy diet regular physical activity recommended.

## 2015-02-11 NOTE — Patient Instructions (Signed)
DASH Eating Plan °DASH stands for "Dietary Approaches to Stop Hypertension." The DASH eating plan is a healthy eating plan that has been shown to reduce high blood pressure (hypertension). Additional health benefits may include reducing the risk of type 2 diabetes mellitus, heart disease, and stroke. The DASH eating plan may also help with weight loss. °WHAT DO I NEED TO KNOW ABOUT THE DASH EATING PLAN? °For the DASH eating plan, you will follow these general guidelines: °· Choose foods with a percent daily value for sodium of less than 5% (as listed on the food label). °· Use salt-free seasonings or herbs instead of table salt or sea salt. °· Check with your health care provider or pharmacist before using salt substitutes. °· Eat lower-sodium products, often labeled as "lower sodium" or "no salt added." °· Eat fresh foods. °· Eat more vegetables, fruits, and low-fat dairy products. °· Choose whole grains. Look for the word "whole" as the first word in the ingredient list. °· Choose fish and skinless chicken or turkey more often than red meat. Limit fish, poultry, and meat to 6 oz (170 g) each day. °· Limit sweets, desserts, sugars, and sugary drinks. °· Choose heart-healthy fats. °· Limit cheese to 1 oz (28 g) per day. °· Eat more home-cooked food and less restaurant, buffet, and fast food. °· Limit fried foods. °· Cook foods using methods other than frying. °· Limit canned vegetables. If you do use them, rinse them well to decrease the sodium. °· When eating at a restaurant, ask that your food be prepared with less salt, or no salt if possible. °WHAT FOODS CAN I EAT? °Seek help from a dietitian for individual calorie needs. °Grains °Whole grain or whole wheat bread. Brown rice. Whole grain or whole wheat pasta. Quinoa, bulgur, and whole grain cereals. Low-sodium cereals. Corn or whole wheat flour tortillas. Whole grain cornbread. Whole grain crackers. Low-sodium crackers. °Vegetables °Fresh or frozen vegetables  (raw, steamed, roasted, or grilled). Low-sodium or reduced-sodium tomato and vegetable juices. Low-sodium or reduced-sodium tomato sauce and paste. Low-sodium or reduced-sodium canned vegetables.  °Fruits °All fresh, canned (in natural juice), or frozen fruits. °Meat and Other Protein Products °Ground beef (85% or leaner), grass-fed beef, or beef trimmed of fat. Skinless chicken or turkey. Ground chicken or turkey. Pork trimmed of fat. All fish and seafood. Eggs. Dried beans, peas, or lentils. Unsalted nuts and seeds. Unsalted canned beans. °Dairy °Low-fat dairy products, such as skim or 1% milk, 2% or reduced-fat cheeses, low-fat ricotta or cottage cheese, or plain low-fat yogurt. Low-sodium or reduced-sodium cheeses. °Fats and Oils °Tub margarines without trans fats. Light or reduced-fat mayonnaise and salad dressings (reduced sodium). Avocado. Safflower, olive, or canola oils. Natural peanut or almond butter. °Other °Unsalted popcorn and pretzels. °The items listed above may not be a complete list of recommended foods or beverages. Contact your dietitian for more options. °WHAT FOODS ARE NOT RECOMMENDED? °Grains °White bread. White pasta. White rice. Refined cornbread. Bagels and croissants. Crackers that contain trans fat. °Vegetables °Creamed or fried vegetables. Vegetables in a cheese sauce. Regular canned vegetables. Regular canned tomato sauce and paste. Regular tomato and vegetable juices. °Fruits °Dried fruits. Canned fruit in light or heavy syrup. Fruit juice. °Meat and Other Protein Products °Fatty cuts of meat. Ribs, chicken wings, bacon, sausage, bologna, salami, chitterlings, fatback, hot dogs, bratwurst, and packaged luncheon meats. Salted nuts and seeds. Canned beans with salt. °Dairy °Whole or 2% milk, cream, half-and-half, and cream cheese. Whole-fat or sweetened yogurt. Full-fat   cheeses or blue cheese. Nondairy creamers and whipped toppings. Processed cheese, cheese spreads, or cheese  curds. °Condiments °Onion and garlic salt, seasoned salt, table salt, and sea salt. Canned and packaged gravies. Worcestershire sauce. Tartar sauce. Barbecue sauce. Teriyaki sauce. Soy sauce, including reduced sodium. Steak sauce. Fish sauce. Oyster sauce. Cocktail sauce. Horseradish. Ketchup and mustard. Meat flavorings and tenderizers. Bouillon cubes. Hot sauce. Tabasco sauce. Marinades. Taco seasonings. Relishes. °Fats and Oils °Butter, stick margarine, lard, shortening, ghee, and bacon fat. Coconut, palm kernel, or palm oils. Regular salad dressings. °Other °Pickles and olives. Salted popcorn and pretzels. °The items listed above may not be a complete list of foods and beverages to avoid. Contact your dietitian for more information. °WHERE CAN I FIND MORE INFORMATION? °[employer], [employer], [employer]: www.nhlbi.nih.gov/health/health-topics/topics/dash/ °Document Released: 11/12/2011 Document Revised: 04/09/2014 Document Reviewed: 09/27/2013 °ExitCare® Patient Information ©2015 [employer]. This information is not intended to replace advice given to you by your health care provider. Make sure you discuss any questions you have with your health care provider. ° °

## 2015-02-13 ENCOUNTER — Encounter: Payer: Self-pay | Admitting: Family Medicine

## 2015-02-13 ENCOUNTER — Ambulatory Visit (INDEPENDENT_AMBULATORY_CARE_PROVIDER_SITE_OTHER): Payer: Medicaid Other | Admitting: Family Medicine

## 2015-02-13 VITALS — BP 112/78 | Temp 98.8°F | Ht 62.0 in | Wt 161.0 lb

## 2015-02-13 DIAGNOSIS — M25512 Pain in left shoulder: Secondary | ICD-10-CM

## 2015-02-13 MED ORDER — DICLOFENAC SODIUM 75 MG PO TBEC: 75.0000 mg | DELAYED_RELEASE_TABLET | Freq: Two times a day (BID) | ORAL | Status: DC

## 2015-02-13 NOTE — Progress Notes (Signed)
   Subjective:    Patient ID: [name], female    DOB: 31-Jan-1996, 19 y.o.   MRN: [id number]  HPI Comments: SOB w/ activity  Headache  Shoulder Pain  The pain is present in the left shoulder and neck (left breast). This is a new problem. The current episode started in the past 7 days. The problem occurs 2 to 4 times per day. The quality of the pain is described as aching. Associated symptoms include a limited range of motion. Exacerbated by: sitting for a long period of time. She has tried NSAIDS for the symptoms.      Review of Systems     Objective:   Physical Exam  On examination lungs clear hearts regular she has tenderness in the chest wall anterior as well as around the shoulder blade.  Mild limitation of range of motion of the arm because of shoulder blade pain    Assessment & Plan:  Moderate shoulder pain as well as mild scapular pain I feel this is a muscle strain stretching exercises shown anti-inflammatory twice a day for the next 2-3 weeks. If not making significant improvement over the next few weeks call back we will set up for physical therapy no need for x-rays.  If not significantly better over the next few weeks follow-up  I believe the discomfort is what's causing her to hurt when she takes a deep breath and also is causing some slight limitation range of motion

## 2015-04-22 ENCOUNTER — Ambulatory Visit (INDEPENDENT_AMBULATORY_CARE_PROVIDER_SITE_OTHER): Payer: Medicaid Other | Admitting: Family Medicine

## 2015-04-22 ENCOUNTER — Encounter: Payer: Self-pay | Admitting: Family Medicine

## 2015-04-22 ENCOUNTER — Ambulatory Visit (HOSPITAL_COMMUNITY)
Admission: RE | Admit: 2015-04-22 | Discharge: 2015-04-22 | Disposition: A | Discharge status: Home / Self Care | Payer: 59 | Source: Ambulatory Visit | Attending: Family Medicine | Admitting: Family Medicine

## 2015-04-22 VITALS — BP 120/78 | Temp 98.7°F | Ht 62.0 in | Wt 167.4 lb

## 2015-04-22 DIAGNOSIS — M25552 Pain in left hip: Secondary | ICD-10-CM

## 2015-04-22 MED ORDER — MELOXICAM 15 MG PO TABS: 15.0000 mg | ORAL_TABLET | Freq: Every day | ORAL | Status: DC

## 2015-04-22 NOTE — Progress Notes (Signed)
   Subjective:    Patient ID: Melissa Livingston, female    DOB: Jul 02, 1996, 19 y.o.   MRN: [id number]  Hip Pain  The incident occurred more than 1 week ago. Injury mechanism: Postpartum. The pain is present in the left hip. The quality of the pain is described as aching. The symptoms are aggravated by movement. She has tried acetaminophen and NSAIDs for the symptoms. The treatment provided no relief.    PMH benign all of this seemed to occur after having a C-section because of a very difficult delivery  Review of Systems Patient with subjective complaints of left hip pain and lower back pain denies sciatica has anterior left hip pain with movement difficult with squatting standing for length of time owing up and down steps    Objective:   Physical Exam  Patient with increased pain discomfort with internal/external rotation of the left hip negative straight leg raise low back nontender      Assessment & Plan:  Left hip pain referral to orthopedics. I feel this Pridemore ligamentous strain. May need injection may need physical therapy anti-inflammatory prescribed x-rays ordered. Meloxicam 1 daily.

## 2015-04-23 ENCOUNTER — Ambulatory Visit (INDEPENDENT_AMBULATORY_CARE_PROVIDER_SITE_OTHER): Payer: Medicaid Other | Admitting: Women's Health

## 2015-04-23 ENCOUNTER — Other Ambulatory Visit (HOSPITAL_COMMUNITY)
Admission: RE | Admit: 2015-04-23 | Discharge: 2015-04-23 | Disposition: A | Discharge status: Home / Self Care | Payer: 59 | Source: Ambulatory Visit | Attending: Obstetrics & Gynecology | Admitting: Obstetrics & Gynecology

## 2015-04-23 ENCOUNTER — Encounter: Payer: Self-pay | Admitting: Women's Health

## 2015-04-23 VITALS — BP 112/64 | HR 58 | Ht 63.75 in | Wt 170.0 lb

## 2015-04-23 DIAGNOSIS — Z1151 Encounter for screening for human papillomavirus (HPV): Secondary | ICD-10-CM | POA: Diagnosis present

## 2015-04-23 DIAGNOSIS — Z01411 Encounter for gynecological examination (general) (routine) with abnormal findings: Secondary | ICD-10-CM | POA: Insufficient documentation

## 2015-04-23 DIAGNOSIS — Z113 Encounter for screening for infections with a predominantly sexual mode of transmission: Secondary | ICD-10-CM | POA: Diagnosis present

## 2015-04-23 DIAGNOSIS — Z8759 Personal history of other complications of pregnancy, childbirth and the puerperium: Secondary | ICD-10-CM | POA: Insufficient documentation

## 2015-04-23 DIAGNOSIS — R34 Anuria and oliguria: Secondary | ICD-10-CM

## 2015-04-23 DIAGNOSIS — N852 Hypertrophy of uterus: Secondary | ICD-10-CM

## 2015-04-23 DIAGNOSIS — Z01419 Encounter for gynecological examination (general) (routine) without abnormal findings: Secondary | ICD-10-CM | POA: Diagnosis not present

## 2015-04-23 DIAGNOSIS — R87810 Cervical high risk human papillomavirus (HPV) DNA test positive: Secondary | ICD-10-CM | POA: Diagnosis present

## 2015-04-23 DIAGNOSIS — R102 Pelvic and perineal pain: Secondary | ICD-10-CM

## 2015-04-23 NOTE — Progress Notes (Signed)
Patient ID: [name], female   DOB: 04-Mar-1996, 19 y.o.   MRN: [id number] Subjective:   Melissa Livingston is a 19 y.o. G29P1001 African American female here for a routine well-woman exam.  Patient's last menstrual period was 03/11/2015.    Current complaints: taking hctz ~3d/wk d/t lower extremity edema- takes a kdur on days she takes hctz. BPs have been normal. Does have some mild sob w/ climbing stairs on days she has edema. No CP.  Some abdominal pain. Doesn't feel like she's voiding as much as she used to, maybe 2x/day. Some abdominal discomfort post voiding. Lt hip pain, began during pregnancy, worsening recently- sees PCP for all of these problems- had xray lt hip yesterday- which was neg.  PCP: Dr. Sallee Lange       Does desire labs, including STI screening  Social History: Sexual: heterosexual Marital Status: dating Living situation: with parents Occupation: Hydrographic surveyor, Scientist, water quality Tobacco/alcohol: no tobacco, rarely etoh- did have some for her 60VO bday Illicit drugs: no illicit drug use  The following portions of the patient's history were reviewed and updated as appropriate: allergies, current medications, past family history, past medical history, past social history, past surgical history and problem list.  Past Medical History Past Medical History  Diagnosis Date  . Asthma   . Pelvic pain in female 01/11/2015  . Nausea and vomiting 01/11/2015  . Endometritis 01/11/2015  . Ovarian cyst 01/14/2015  . History of ovarian cyst 01/28/2015    Past Surgical History Past Surgical History  Procedure Laterality Date  . None    . Esophagogastroduodenoscopy (egd) with esophageal dilation N/A 07/19/2013    Procedure: ESOPHAGOGASTRODUODENOSCOPY (EGD) WITH ESOPHAGEAL DILATION;  Surgeon: Daneil Dolin, MD;  Location: [facility];  Service: Endoscopy;  Laterality: N/A;  2:45  . Cesarean section N/A 09/29/2014    Procedure: CESAREAN SECTION;  Surgeon: Jonnie Kind, MD;  Location: [facility];  Service:  Obstetrics;  Laterality: N/A;    Gynecologic History G1P1001  Patient's last menstrual period was 03/11/2015. Contraception: Depo-Provera injections Last Pap: never. Results were: never Last mammogram: never Results were: n/a, had Rt breast u/s in 2012 for palpable Rt breast abnormalities- u/s was normal Last TCS: never  Obstetric History OB History  Gravida Para Term Preterm AB SAB TAB Ectopic Multiple Living  1 1 1       1     # Outcome Date GA Lbr Len/2nd Weight Sex Delivery Anes PTL Lv  1 Term 09/29/14 [redacted]w[redacted]d 80:34 / 03:58 7 lb 4.8 oz (3.311 kg) F CS-LTranv EPI  Y      Current Medications Current Outpatient Prescriptions on File Prior to Visit  Medication Sig Dispense Refill  . hydrochlorothiazide (HYDRODIURIL) 25 MG tablet 1 qam prn pedal edema 30 tablet 4  . Iron-FA-B Cmp-C-Biot-Probiotic (FUSION PLUS) CAPS Take 1 capsule by mouth See admin instructions. 1 time daily between meals 30 capsule 6  . medroxyPROGESTERone (DEPO-PROVERA) 150 MG/ML injection Inject 1 mL (150 mg total) into the muscle every 3 (three) months. 1 mL 4  . meloxicam (MOBIC) 15 MG tablet Take 1 tablet (15 mg total) by mouth daily. 30 tablet 2  . potassium chloride SA (K-DUR,KLOR-CON) 20 MEQ tablet 1 qam prn diuretic use 30 tablet 3   No current facility-administered medications on file prior to visit.    Review of Systems Patient denies any headaches, blurred vision, shortness of breath, chest pain, abdominal pain, problems with bowel movements, urination, or intercourse.  Objective:  BP 112/64  mmHg  Pulse 58  Ht 5' 3.75" (1.619 m)  Wt 170 lb (77.111 kg)  BMI 29.42 kg/m2  LMP 03/11/2015 Physical Exam  General:  Well developed, well nourished, no acute distress. She is alert and oriented x3. Skin:  Warm and dry Neck:  Midline trachea, no thyromegaly or nodules Cardiovascular: Regular rate and rhythm, no murmur heard Lungs:  Effort normal, all lung fields clear to auscultation bilaterally Breasts:   No dominant palpable mass, retraction, or nipple discharge Abdomen:  Soft, non tender, no hepatosplenomegaly or masses Pelvic:  External genitalia is normal in appearance.  The vagina is normal in appearance. The cervix is bulbous, no CMT.  Thin prep pap is done w/ reflex HR HPV cotesting. Uterus is felt to be enlarged, No adnexal masses. + diffuse pelvic tenderness Extremities:  No swelling or varicosities noted Psych:  She has a normal mood and affect  Assessment:   Healthy well-woman exam STI screening Pelvic pain Abdominal pain postvoiding Lower extremity edema requiring diuretics & K+replacement  Plan:  UA, Urine cx, CBC, CMP, TSH, HIV, RPR, HepB&C, HSV2 today, GC/CT from pap F/U 1wk for pelvic u/s & f/u with me Mammogram @19yo  or sooner if problems Colonoscopy @19yo  or sooner if problems Return 5/19 for depo as scheduled  Tawnya Crook CNM, [facility] 04/23/2015 9:43 AM

## 2015-04-24 ENCOUNTER — Other Ambulatory Visit: Payer: Medicaid Other | Admitting: *Deleted

## 2015-04-24 ENCOUNTER — Telehealth: Payer: Self-pay | Admitting: *Deleted

## 2015-04-24 DIAGNOSIS — Z3202 Encounter for pregnancy test, result negative: Secondary | ICD-10-CM

## 2015-04-24 LAB — CBC
HEMOGLOBIN: 10.5 g/dL — AB (ref 11.1–15.9)
Hematocrit: 34.7 % (ref 34.0–46.6)
MCH: 19.4 pg — AB (ref 26.6–33.0)
MCHC: 30.3 g/dL — AB (ref 31.5–35.7)
MCV: 64 fL — ABNORMAL LOW (ref 79–97)
PLATELETS: 424 10*3/uL — AB (ref 150–379)
RBC: 5.4 x10E6/uL — ABNORMAL HIGH (ref 3.77–5.28)
RDW: 19.5 % — ABNORMAL HIGH (ref 12.3–15.4)
WBC: 5 10*3/uL (ref 3.4–10.8)

## 2015-04-24 LAB — URINALYSIS, ROUTINE W REFLEX MICROSCOPIC
BILIRUBIN UA: NEGATIVE
GLUCOSE, UA: NEGATIVE
KETONES UA: NEGATIVE
Nitrite, UA: NEGATIVE
RBC, UA: NEGATIVE
Specific Gravity, UA: 1.016 (ref 1.005–1.030)
UUROB: 0.2 mg/dL (ref 0.2–1.0)
pH, UA: 6.5 (ref 5.0–7.5)

## 2015-04-24 LAB — COMPREHENSIVE METABOLIC PANEL
ALK PHOS: 90 IU/L (ref 39–117)
ALT: 7 IU/L (ref 0–32)
AST: 11 IU/L (ref 0–40)
Albumin/Globulin Ratio: 1.6 (ref 1.1–2.5)
Albumin: 4.4 g/dL (ref 3.5–5.5)
BUN/Creatinine Ratio: 8 (ref 8–20)
BUN: 7 mg/dL (ref 6–20)
Bilirubin Total: 0.4 mg/dL (ref 0.0–1.2)
CALCIUM: 9.2 mg/dL (ref 8.7–10.2)
CO2: 20 mmol/L (ref 18–29)
Chloride: 103 mmol/L (ref 97–108)
Creatinine, Ser: 0.88 mg/dL (ref 0.57–1.00)
GFR calc Af Amer: 109 mL/min/{1.73_m2} (ref >59)
GFR, EST NON AFRICAN AMERICAN: 94 mL/min/{1.73_m2} (ref >59)
Globulin, Total: 2.7 g/dL (ref 1.5–4.5)
Glucose: 97 mg/dL (ref 65–99)
POTASSIUM: 4.8 mmol/L (ref 3.5–5.2)
Sodium: 138 mmol/L (ref 134–144)
TOTAL PROTEIN: 7.1 g/dL (ref 6.0–8.5)

## 2015-04-24 LAB — MICROSCOPIC EXAMINATION
Casts: NONE SEEN /lpf
Epithelial Cells (non renal): >10 /hpf — AB (ref 0–10)

## 2015-04-24 LAB — URINE CULTURE

## 2015-04-24 LAB — CYTOLOGY - PAP

## 2015-04-24 LAB — TSH: TSH: 1.93 u[IU]/mL (ref 0.450–4.500)

## 2015-04-24 MED ORDER — TRAMADOL HCL 50 MG PO TABS: ORAL_TABLET | ORAL | Status: DC

## 2015-04-24 NOTE — Telephone Encounter (Signed)
Pt informed RX at front desk to be picked up and of new date and time for u/s.  Pt verbalized understanding.

## 2015-04-24 NOTE — Telephone Encounter (Signed)
Pt informed Maudie Mercury wants to get a UPT on her when she comes for RX.

## 2015-04-24 NOTE — Telephone Encounter (Signed)
Pt states she was seen yesterday and had been having some abdominal pain and was told on exam that her cervix was enlarged or inflamed, she states that since her exam yesterday she has had increased abdominal pain and was not able to sleep much last night due to the pain.  She states she used Tylenol for pain with no relief and her U/S is not scheduled until 05/31.  Pt states she needs something done before the 31st for the pain.  Please advise.

## 2015-04-25 ENCOUNTER — Ambulatory Visit: Payer: Medicaid Other

## 2015-04-25 ENCOUNTER — Ambulatory Visit: Payer: Medicaid Other | Admitting: Family Medicine

## 2015-04-25 ENCOUNTER — Encounter: Payer: Self-pay | Admitting: *Deleted

## 2015-04-25 ENCOUNTER — Ambulatory Visit (INDEPENDENT_AMBULATORY_CARE_PROVIDER_SITE_OTHER): Payer: 59 | Admitting: *Deleted

## 2015-04-25 DIAGNOSIS — Z3042 Encounter for surveillance of injectable contraceptive: Secondary | ICD-10-CM | POA: Diagnosis not present

## 2015-04-25 DIAGNOSIS — Z3202 Encounter for pregnancy test, result negative: Secondary | ICD-10-CM | POA: Diagnosis not present

## 2015-04-25 LAB — POCT URINE PREGNANCY: Preg Test, Ur: NEGATIVE

## 2015-04-25 MED ORDER — MEDROXYPROGESTERONE ACETATE 150 MG/ML IM SUSP
150.0000 mg | Freq: Once | INTRAMUSCULAR | Status: AC
  Administered 2015-04-25: 150 mg via INTRAMUSCULAR

## 2015-04-25 NOTE — Progress Notes (Signed)
Pt here for Depo. Reports still having stomach pain. Has an [place] scheduled for Tuesday. Advised to keep that appt. Pt to have next Depo in 12 weeks. Pt requests work note for today. I spoke with Chrystal, RN and she advised that was fine. [place]

## 2015-04-26 ENCOUNTER — Telehealth: Payer: Self-pay | Admitting: Obstetrics & Gynecology

## 2015-04-26 NOTE — Telephone Encounter (Signed)
Duplicate message. 

## 2015-04-26 NOTE — Telephone Encounter (Signed)
Pt c/o severe abdominal pain and the Tramadol for enlarged uterus Knute Neu, CNM prescribed is not helping. Pt requesting med for pain until her appt next week for u/s and provider. Pt also requesting a note for work for today.

## 2015-04-26 NOTE — Telephone Encounter (Signed)
Has not been eating due to pain when stands up is light headed, instructed to go to ER may need IV fluids

## 2015-04-30 ENCOUNTER — Ambulatory Visit (INDEPENDENT_AMBULATORY_CARE_PROVIDER_SITE_OTHER): Payer: 59

## 2015-04-30 ENCOUNTER — Ambulatory Visit (INDEPENDENT_AMBULATORY_CARE_PROVIDER_SITE_OTHER): Payer: Medicaid Other | Admitting: Obstetrics & Gynecology

## 2015-04-30 ENCOUNTER — Telehealth: Payer: Self-pay | Admitting: Women's Health

## 2015-04-30 ENCOUNTER — Encounter: Payer: Self-pay | Admitting: Obstetrics & Gynecology

## 2015-04-30 VITALS — BP 126/80 | Ht 63.0 in | Wt 165.0 lb

## 2015-04-30 DIAGNOSIS — R102 Pelvic and perineal pain: Secondary | ICD-10-CM | POA: Diagnosis not present

## 2015-04-30 DIAGNOSIS — K219 Gastro-esophageal reflux disease without esophagitis: Secondary | ICD-10-CM | POA: Diagnosis not present

## 2015-04-30 DIAGNOSIS — N852 Hypertrophy of uterus: Secondary | ICD-10-CM | POA: Diagnosis not present

## 2015-04-30 DIAGNOSIS — Z7251 High risk heterosexual behavior: Secondary | ICD-10-CM | POA: Diagnosis not present

## 2015-04-30 NOTE — Telephone Encounter (Signed)
Notified pt of abnormal pap/need for colpo ([facility] also aware), anemia- has already started taking fusion plus. Pain has increased since visit, now upper abd as well and feels like she has a knot. Hasn't eaten since last night. Has appt for u/s and f/u w/ [facility] today- spoke w/ amber- will get her to look in upper abd as well.  Roma Schanz, CNM, [facility] 04/30/2015 12:03 PM

## 2015-04-30 NOTE — Progress Notes (Signed)
US pelvis: normal retroverted uterus 6.6 x 5.4 x 4.0cm,normal ov's bilat,small amount of cul de sac fluid,eec 5.68mm,limited view T/A because of bowel gas, pain on lt during ultrasound.

## 2015-04-30 NOTE — Progress Notes (Signed)
Patient ID: [name], female   DOB: [birth year], 19 y.o.   MRN: [id number]   Chief Complaint  Patient presents with  . Results    Pt had [place] and then to see Dr. Elonda Husky    Blood pressure 126/80, height 5\' 3"  (1.6 m), weight 165 lb (74.844 kg), last menstrual period 03/11/2015, not currently breastfeeding.   Here for sonogram and results  Normal  Pt really having more epigastic and GI pain IUD was taken out and she is on depo which is excellent for suppresion of any pelvic pathology  Referred to Dr Buford Dresser who she has seen before for her epigastric and lower abdominal pain     Face to face time:  15 minutes  Greater than 50% of the visit time was spent in counseling and coordination of care with the patient.  The summary and outline of the counseling and care coordination is summarized in the note above.   All questions were answered.     GYNECOLOGIC SONOGRAM   Somalia Blanchette is a 19 y.o. G1P1001 LMP 03/11/2015 for a pelvic sonogram for pelvic pain and enlarged uterus.  Uterus 6.6 x 5.4 x 4.02 cm, retroverted wnl  Endometrium 5.45 mm, symmetrical, wnl  Right ovary 4.04 x 3.66 x 2.01 cm, wnl  Left ovary 4.5 x 3.68 x 1.45 cm, wnl    Technician Comments:   US pelvis: normal retroverted uterus 6.6 x 5.4 x 4.0cm,normal ov's bilat,small amount of cul de sac fluid,eec 5.25mm,limited view T/A because of bowel gas, pain on lt during ultrasound.   Amber Heide Guile 04/30/2015 3:14 PM  Clinical Impression and recommendations:  I have reviewed the sonogram results above, combined with the patient's current clinical course, below are my impressions and any appropriate recommendations for management based on the sonographic findings.  Normal pelvic sonogram   Karrigan Messamore H 04/30/2015 3:26 PM

## 2015-05-01 LAB — HEPATITIS B SURFACE ANTIGEN: HEP B S AG: NEGATIVE

## 2015-05-01 LAB — RPR: RPR Ser Ql: NONREACTIVE

## 2015-05-01 LAB — H. PYLORI ANTIBODY, IGG

## 2015-05-01 LAB — HEPATITIS C ANTIBODY: Hep C Virus Ab: <0.1 s/co ratio (ref 0.0–0.9)

## 2015-05-01 LAB — HSV 2 ANTIBODY, IGG

## 2015-05-01 LAB — HIV ANTIBODY (ROUTINE TESTING W REFLEX): HIV Screen 4th Generation wRfx: NONREACTIVE

## 2015-05-07 ENCOUNTER — Other Ambulatory Visit: Payer: Medicaid Other

## 2015-05-07 ENCOUNTER — Ambulatory Visit: Payer: Medicaid Other | Admitting: Women's Health

## 2015-05-08 ENCOUNTER — Ambulatory Visit (HOSPITAL_COMMUNITY)
Admission: RE | Admit: 2015-05-08 | Discharge: 2015-05-08 | Disposition: A | Discharge status: Home / Self Care | Payer: Commercial Managed Care - HMO | Source: Ambulatory Visit | Attending: Gastroenterology | Admitting: Gastroenterology

## 2015-05-08 ENCOUNTER — Encounter: Payer: Self-pay | Admitting: Gastroenterology

## 2015-05-08 ENCOUNTER — Ambulatory Visit (INDEPENDENT_AMBULATORY_CARE_PROVIDER_SITE_OTHER): Payer: Commercial Managed Care - HMO | Admitting: Gastroenterology

## 2015-05-08 ENCOUNTER — Other Ambulatory Visit: Payer: Self-pay

## 2015-05-08 ENCOUNTER — Other Ambulatory Visit (HOSPITAL_COMMUNITY): Payer: Medicaid Other

## 2015-05-08 VITALS — BP 134/80 | HR 106 | Temp 97.6°F | Ht 63.0 in | Wt 166.2 lb

## 2015-05-08 DIAGNOSIS — K589 Irritable bowel syndrome without diarrhea: Secondary | ICD-10-CM

## 2015-05-08 DIAGNOSIS — J45909 Unspecified asthma, uncomplicated: Secondary | ICD-10-CM | POA: Insufficient documentation

## 2015-05-08 DIAGNOSIS — R1013 Epigastric pain: Secondary | ICD-10-CM

## 2015-05-08 DIAGNOSIS — K921 Melena: Secondary | ICD-10-CM | POA: Diagnosis not present

## 2015-05-08 DIAGNOSIS — R1314 Dysphagia, pharyngoesophageal phase: Secondary | ICD-10-CM

## 2015-05-08 DIAGNOSIS — K219 Gastro-esophageal reflux disease without esophagitis: Secondary | ICD-10-CM | POA: Insufficient documentation

## 2015-05-08 HISTORY — DX: Melena: K92.1

## 2015-05-08 MED ORDER — HYOSCYAMINE SULFATE 0.125 MG SL SUBL: 0.1250 mg | SUBLINGUAL_TABLET | SUBLINGUAL | Status: DC | PRN

## 2015-05-08 NOTE — Assessment & Plan Note (Signed)
Trial Levsin for intermittent abdominal cramping/loose stool. Low-volume hematochezia likely benign finding in setting of intermittent loose stool; however, unable to exclude other etiology. Recommend colonoscopy in future; will hold on scheduling this until it is known if she will need an EGD in near future. If so, proceed with both in near future.

## 2015-05-08 NOTE — Assessment & Plan Note (Signed)
Colonoscopy in near future.

## 2015-05-08 NOTE — Progress Notes (Signed)
Referring Provider: Kathyrn Drown, MD Primary Care Physician:  Sallee Lange, MD  Primary GI: Dr. Gala Romney   Chief Complaint  Patient presents with  . Follow-up    HPI:   Melissa Livingston is a 19 y.o. female presenting today with a history of chronic abdominal pain, IBS, GERD, chronic intermittent low-volume hematochezia. EGD on file from 2014 with empiric dilation and normal findings overall.   Anything she eats upsets her stomach. States pain is constant. Eating worsens. Usually hurts worse in the morning than at night. Pain located in epigastric area. Symptoms chronic but worsened during pregnancy last year, had a girl in October 2015. Associated nausea but no vomiting. Associated solid food dysphagia. Depending on food she eats will have loose stools. Heavier foods don't agree with stomach. Associated abdominal cramping with loose stool. Low-volume paper hematochezia in the past. Makes her bottom "raw". Notes lack of appetite. Was taking Mobic for about 2 months. Intermittent indigestion/heartburn/belching. Taking OTC agents without relief. Has taken Protonix and Prilosec during pregnancy without relief. Was taking Ibuprofen historically.    Usually normal BM when eating "lighter" foods.   Past Medical History  Diagnosis Date  . Asthma   . Pelvic pain in female 01/11/2015  . Nausea and vomiting 01/11/2015  . Endometritis 01/11/2015  . Ovarian cyst 01/14/2015  . History of ovarian cyst 01/28/2015  . GERD (gastroesophageal reflux disease)     Past Surgical History  Procedure Laterality Date  . None    . Esophagogastroduodenoscopy (egd) with esophageal dilation N/A 07/19/2013    Dr. Rourk:normal appearing esophagus s/p dilation, normal D1 and D2, unremarkable path   . Cesarean section N/A 09/29/2014    Procedure: CESAREAN SECTION;  Surgeon: Jonnie Kind, MD;  Location: [place];  Service: Obstetrics;  Laterality: N/A;    Current Outpatient Prescriptions  Medication Sig Dispense  Refill  . Iron-FA-B Cmp-C-Biot-Probiotic (FUSION PLUS) CAPS Take 1 capsule by mouth See admin instructions. 1 time daily between meals 30 capsule 6  . medroxyPROGESTERone (DEPO-PROVERA) 150 MG/ML injection Inject 1 mL (150 mg total) into the muscle every 3 (three) months. 1 mL 4  . hyoscyamine (LEVSIN SL) 0.125 MG SL tablet Place 1 tablet (0.125 mg total) under the tongue every 4 (four) hours as needed. 30 tablet 0   No current facility-administered medications for this visit.    Allergies as of 05/08/2015 - Review Complete 05/08/2015  Allergen Reaction Noted  . Lorabid [loracarbef] Hives and Swelling 03/28/2013  . Latex Itching and Rash 07/18/2013  . Orange fruit [citrus] Rash 07/18/2013    Family History  Problem Relation Age of Onset  . Multiple sclerosis Mother   . Colon cancer Other     maternal great uncle, age greater than 38  . Heart disease Maternal Grandfather   . Heart disease Paternal Uncle   . Diabetes Paternal Uncle   . Hypertension Paternal Uncle   . Cancer Other     father's aunt had breast cancer    History   Social History  . Marital Status: Single    Spouse Name: N/A  . Number of Children: 0  . Years of Education: N/A   Occupational History  . out due to knee    Social History Main Topics  . Smoking status: Never Smoker   . Smokeless tobacco: Never Used  . Alcohol Use: No  . Drug Use: No  . Sexual Activity: Yes    Birth Control/ Protection: Injection  Other Topics Concern  . None   Social History Narrative    Review of Systems: As mentioned in HPI  Physical Exam: BP 134/80 mmHg  Pulse 106  Temp(Src) 97.6 F (36.4 C) (Oral)  Ht 5\' 3"  (1.6 m)  Wt 166 lb 3.2 oz (75.388 kg)  BMI 29.45 kg/m2  LMP 03/11/2015 General:   Alert and oriented. No distress noted. Pleasant and cooperative.  Head:  Normocephalic and atraumatic. Eyes:  Conjuctiva clear without scleral icterus. Mouth:  Oral mucosa pink and moist. Good dentition. No  lesions. Heart:  S1, S2 present without murmurs, rubs, or gallops. Regular rate and rhythm. Abdomen:  +BS, soft, mild TTP upper abdomen and non-distended. No rebound or guarding. No HSM or masses noted. Msk:  Symmetrical without gross deformities. Normal posture. Extremities:  Without edema. Neurologic:  Alert and  oriented x4;  grossly normal neurologically. Skin:  Intact without significant lesions or rashes. Psych:  Alert and cooperative. Normal mood and affect.  Lab Results  Component Value Date   WBC 5.0 04/23/2015   HGB 9.5* 01/11/2015   HCT 34.7 04/23/2015   MCV 67* 01/11/2015   PLT 307 01/11/2015   Lab Results  Component Value Date   ALT 7 04/23/2015   AST 11 04/23/2015   ALKPHOS 90 04/23/2015   BILITOT 0.4 04/23/2015   May 2016 H.pylori serology indeterminate at 0.9   US abdomen Aug 2014: normal

## 2015-05-08 NOTE — Assessment & Plan Note (Addendum)
19 year old female with acute on chronic dyspepsia in the setting of recent NSAID use, no PPI. Intermittent GERD, nausea, exacerbated by eating. H.pylori serology indeterminate at 0.9. Last EGD normal in 2014. Likely NSAID induced gastritis, possible ulcer disease, esophagitis, unable to exclude biliary etiology as gallbladder remains in situ. H.pylori serology not ideal for identifying active infection. Will obtain urea breath test now, as she has not been on a PPI. Then, will trial Dexilant X 2 weeks and if no improvement, proceed with repeat EGD and dilation as appropriate with biopsies. In interim, obtain US abdomen to assess for evidence of cholelithiasis. May eventually need HIDA in future if biliary etiology remains a concern. Will contact patient in 10 days for a progress report. Will need EGD with dilation with Dr. Gala Romney if no improvement at that time.

## 2015-05-08 NOTE — Patient Instructions (Signed)
For reflux: start taking Dexilant once each morning. I have provided samples.   For loose stool and cramping, you may take Levsin as needed every 4 hours. Monitor for constipation.   We have set you up for an ultrasound of your abdomen in the meantime.  We will call in 10 days for a progress report. If you aren't better, I suggest an endoscopy with possible dilation and a colonoscopy due to rectal bleeding.

## 2015-05-08 NOTE — Assessment & Plan Note (Signed)
Likely secondary to uncontrolled GERD, esophagitis, less likely web, ring, or stricture. Empiric dilation in 2014. Proceed with starting Dexilant and pursue EGD/dilation if no improvement.

## 2015-05-09 LAB — H. PYLORI BREATH TEST: H. pylori Breath Test: NOT DETECTED

## 2015-05-14 ENCOUNTER — Encounter: Payer: Medicaid Other | Admitting: Obstetrics & Gynecology

## 2015-05-14 NOTE — Progress Notes (Signed)
Quick Note:  US abdomen normal.  UREA breath test normal. Negative for H.pylori.  How is she doing on Dexilant? If she is not better, needs EGD for dyspepsia, with colonoscopy at time of EGD due to history of low-volume hematochezia. ______

## 2015-05-16 ENCOUNTER — Other Ambulatory Visit: Payer: Self-pay

## 2015-05-16 DIAGNOSIS — R1013 Epigastric pain: Secondary | ICD-10-CM

## 2015-05-16 DIAGNOSIS — K921 Melena: Secondary | ICD-10-CM

## 2015-05-16 MED ORDER — PEG-KCL-NACL-NASULF-NA ASC-C 100 G PO SOLR: 1.0000 | Freq: Once | ORAL | Status: AC

## 2015-05-20 ENCOUNTER — Telehealth: Payer: Self-pay

## 2015-05-20 ENCOUNTER — Encounter: Payer: Medicaid Other | Admitting: Obstetrics & Gynecology

## 2015-05-20 NOTE — Telephone Encounter (Signed)
Pt is calling to change her TCS because she does not want to have to be on clear liquids on July 4th. She is waning to have her TCS done on July 8th. I have moved her to the 8th @ 11:15.

## 2015-05-22 NOTE — Progress Notes (Signed)
cc'd to pcp 

## 2015-05-23 ENCOUNTER — Encounter: Payer: Self-pay | Admitting: Obstetrics & Gynecology

## 2015-05-23 ENCOUNTER — Ambulatory Visit (INDEPENDENT_AMBULATORY_CARE_PROVIDER_SITE_OTHER): Payer: 59 | Admitting: Obstetrics & Gynecology

## 2015-05-23 VITALS — BP 120/80 | Wt 160.0 lb

## 2015-05-23 DIAGNOSIS — IMO0002 Reserved for concepts with insufficient information to code with codable children: Secondary | ICD-10-CM

## 2015-05-23 DIAGNOSIS — R896 Abnormal cytological findings in specimens from other organs, systems and tissues: Secondary | ICD-10-CM

## 2015-05-23 NOTE — Progress Notes (Signed)
Patient ID: [name], female   DOB: 21-Feb-1996, 21 y.o.   MRN: [id number] Colposcopy Procedure Note:  Colposcopy Procedure Note  Indications: Pap smear 1 months ago showed: ASCUS with POSITIVE high risk HPV. The prior pap showed first pap ever done.  Prior cervical/vaginal disease:na Prior cervical treatment: na.  Smoker:  No. New sexual partner:  Yes.    : time frame:    History of abnormal Pap: first Pap  Procedure Details  The risks and benefits of the procedure and Verbal informed consent obtained.  Speculum placed in vagina and excellent visualization of cervix achieved, cervix swabbed x 3 with acetic acid solution.  Findings: Cervix: no visible lesions, no mosaicism, no punctation, no abnormal vasculature and HPV changes noted at 2 o'clock; no biopsies taken. Vaginal inspection: normal without visible lesions. Vulvar colposcopy: vulvar colposcopy not performed.  Specimens: none  Complications: none.  Plan: Follow up Pap in 1 year

## 2015-06-11 ENCOUNTER — Telehealth: Payer: Self-pay

## 2015-06-11 NOTE — Telephone Encounter (Signed)
error 

## 2015-06-12 ENCOUNTER — Inpatient Hospital Stay (HOSPITAL_COMMUNITY)
Admission: EM | Admit: 2015-06-12 | Discharge: 2015-06-16 | DRG: 871 | Disposition: A | Discharge status: Home / Self Care | Payer: Commercial Managed Care - HMO | Attending: Internal Medicine | Admitting: Internal Medicine

## 2015-06-12 ENCOUNTER — Encounter (HOSPITAL_COMMUNITY): Payer: Self-pay | Admitting: Emergency Medicine

## 2015-06-12 DIAGNOSIS — B9620 Unspecified Escherichia coli [E. coli] as the cause of diseases classified elsewhere: Secondary | ICD-10-CM | POA: Diagnosis present

## 2015-06-12 DIAGNOSIS — Z8249 Family history of ischemic heart disease and other diseases of the circulatory system: Secondary | ICD-10-CM

## 2015-06-12 DIAGNOSIS — Z820 Family history of epilepsy and other diseases of the nervous system: Secondary | ICD-10-CM

## 2015-06-12 DIAGNOSIS — R6521 Severe sepsis with septic shock: Secondary | ICD-10-CM | POA: Diagnosis present

## 2015-06-12 DIAGNOSIS — N12 Tubulo-interstitial nephritis, not specified as acute or chronic: Secondary | ICD-10-CM

## 2015-06-12 DIAGNOSIS — N10 Acute tubulo-interstitial nephritis: Secondary | ICD-10-CM | POA: Diagnosis present

## 2015-06-12 DIAGNOSIS — D509 Iron deficiency anemia, unspecified: Secondary | ICD-10-CM | POA: Diagnosis present

## 2015-06-12 DIAGNOSIS — Z800 Family history of malignant neoplasm of digestive organs: Secondary | ICD-10-CM

## 2015-06-12 DIAGNOSIS — K219 Gastro-esophageal reflux disease without esophagitis: Secondary | ICD-10-CM | POA: Diagnosis present

## 2015-06-12 DIAGNOSIS — R17 Unspecified jaundice: Secondary | ICD-10-CM | POA: Diagnosis present

## 2015-06-12 DIAGNOSIS — R112 Nausea with vomiting, unspecified: Secondary | ICD-10-CM

## 2015-06-12 DIAGNOSIS — Z833 Family history of diabetes mellitus: Secondary | ICD-10-CM

## 2015-06-12 DIAGNOSIS — E860 Dehydration: Secondary | ICD-10-CM | POA: Diagnosis present

## 2015-06-12 DIAGNOSIS — A419 Sepsis, unspecified organism: Principal | ICD-10-CM | POA: Diagnosis present

## 2015-06-12 DIAGNOSIS — R509 Fever, unspecified: Secondary | ICD-10-CM | POA: Diagnosis not present

## 2015-06-12 DIAGNOSIS — E871 Hypo-osmolality and hyponatremia: Secondary | ICD-10-CM | POA: Diagnosis present

## 2015-06-12 DIAGNOSIS — K589 Irritable bowel syndrome without diarrhea: Secondary | ICD-10-CM | POA: Diagnosis present

## 2015-06-12 DIAGNOSIS — J45909 Unspecified asthma, uncomplicated: Secondary | ICD-10-CM | POA: Diagnosis present

## 2015-06-12 DIAGNOSIS — Q433 Congenital malformations of intestinal fixation: Secondary | ICD-10-CM

## 2015-06-12 DIAGNOSIS — E876 Hypokalemia: Secondary | ICD-10-CM

## 2015-06-12 DIAGNOSIS — D649 Anemia, unspecified: Secondary | ICD-10-CM | POA: Diagnosis present

## 2015-06-12 LAB — PREGNANCY, URINE: PREG TEST UR: NEGATIVE

## 2015-06-12 LAB — CBC WITH DIFFERENTIAL/PLATELET
BASOS PCT: 0 % (ref 0–1)
Basophils Absolute: 0 10*3/uL (ref 0.0–0.1)
EOS ABS: 0 10*3/uL (ref 0.0–0.7)
Eosinophils Relative: 0 % (ref 0–5)
HCT: 33.5 % — ABNORMAL LOW (ref 36.0–46.0)
HEMOGLOBIN: 10.2 g/dL — AB (ref 12.0–15.0)
Lymphocytes Relative: 6 % — ABNORMAL LOW (ref 12–46)
Lymphs Abs: 1 10*3/uL (ref 0.7–4.0)
MCH: 19.7 pg — AB (ref 26.0–34.0)
MCHC: 30.4 g/dL (ref 30.0–36.0)
MCV: 64.8 fL — ABNORMAL LOW (ref 78.0–100.0)
MONO ABS: 1.4 10*3/uL — AB (ref 0.1–1.0)
MONOS PCT: 9 % (ref 3–12)
Neutro Abs: 13 10*3/uL — ABNORMAL HIGH (ref 1.7–7.7)
Neutrophils Relative %: 85 % — ABNORMAL HIGH (ref 43–77)
Platelets: 326 10*3/uL (ref 150–400)
RBC: 5.17 MIL/uL — ABNORMAL HIGH (ref 3.87–5.11)
RDW: 17 % — ABNORMAL HIGH (ref 11.5–15.5)
WBC: 15.4 10*3/uL — AB (ref 4.0–10.5)

## 2015-06-12 LAB — URINALYSIS, ROUTINE W REFLEX MICROSCOPIC
Glucose, UA: 250 mg/dL — AB
Ketones, ur: NEGATIVE mg/dL
Nitrite: POSITIVE — AB
Protein, ur: >300 mg/dL — AB
Specific Gravity, Urine: 1.015 (ref 1.005–1.030)
pH: 5 (ref 5.0–8.0)

## 2015-06-12 LAB — URINE MICROSCOPIC-ADD ON

## 2015-06-12 LAB — COMPREHENSIVE METABOLIC PANEL
ALK PHOS: 80 U/L (ref 38–126)
ALT: 14 U/L (ref 14–54)
AST: 16 U/L (ref 15–41)
Albumin: 4.2 g/dL (ref 3.5–5.0)
Anion gap: 10 (ref 5–15)
BILIRUBIN TOTAL: 2.5 mg/dL — AB (ref 0.3–1.2)
BUN: 6 mg/dL (ref 6–20)
CO2: 21 mmol/L — AB (ref 22–32)
Calcium: 8.4 mg/dL — ABNORMAL LOW (ref 8.9–10.3)
Chloride: 103 mmol/L (ref 101–111)
Creatinine, Ser: 0.97 mg/dL (ref 0.44–1.00)
GFR calc Af Amer: >60 mL/min (ref >60)
GFR calc non Af Amer: >60 mL/min (ref >60)
GLUCOSE: 107 mg/dL — AB (ref 65–99)
POTASSIUM: 3.2 mmol/L — AB (ref 3.5–5.1)
Sodium: 134 mmol/L — ABNORMAL LOW (ref 135–145)
TOTAL PROTEIN: 7.7 g/dL (ref 6.5–8.1)

## 2015-06-12 MED ORDER — DEXTROSE 5 % IV SOLN
1.0000 g | Freq: Once | INTRAVENOUS | Status: AC
  Administered 2015-06-12: 1 g via INTRAVENOUS
  Filled 2015-06-12: qty 10

## 2015-06-12 MED ORDER — SODIUM CHLORIDE 0.9 % IV BOLUS (SEPSIS)
1000.0000 mL | Freq: Once | INTRAVENOUS | Status: AC
Start: 2015-06-12 — End: 2015-06-13
  Administered 2015-06-13: 1000 mL via INTRAVENOUS

## 2015-06-12 MED ORDER — SODIUM CHLORIDE 0.9 % IV BOLUS (SEPSIS)
1000.0000 mL | Freq: Once | INTRAVENOUS | Status: AC
Start: 2015-06-12 — End: 2015-06-13
  Administered 2015-06-12: 1000 mL via INTRAVENOUS

## 2015-06-12 MED ORDER — ONDANSETRON HCL 4 MG/2ML IJ SOLN
4.0000 mg | Freq: Once | INTRAMUSCULAR | Status: AC
  Administered 2015-06-12: 4 mg via INTRAVENOUS
  Filled 2015-06-12: qty 2

## 2015-06-12 NOTE — ED Provider Notes (Signed)
CSN: [id number]     Arrival date & time 06/12/15  2205 History  This chart was scribed for Rolland Porter, MD by Julien Nordmann, ED Scribe. This patient was seen in room APA19/APA19 and the patient's care was started at 11:09 PM.    Chief Complaint  Patient presents with  . Fever      The history is provided by the patient. No language interpreter was used.   HPI Comments: Melissa Livingston is a 19 y.o. female who presents to the Emergency Department complaining of lower back pain onset Monday, July 4th. Pt notes having associated vomiting x 2 today, fever starting today (102.8) 2 hours ago, chills since yesterday, sore throat, right sided flank pain starting on July 5th. She notes vomiting twice today with the last time being about 6 hours ago. Pt also notes having urinary frequency, , urgency and dysuria, without hematuria since July 2. She states her fingers went numb and loss color when she took her temperature this evening. She reports feeling dizzy and lightheaded when she stands up. Pt says she has never has these symptoms before. She notes calling the doctor on July 2 and notes they told her she might have a UTI and was told to get some OTC UTI medication, AZO, which she has been taking.  She denies coughing, rhinorrhea, diarrhea and hematuria.   PCP Dr Wolfgang Phoenix  Past Medical History  Diagnosis Date  . Asthma   . Pelvic pain in female 01/11/2015  . Nausea and vomiting 01/11/2015  . Endometritis 01/11/2015  . Ovarian cyst 01/14/2015  . History of ovarian cyst 01/28/2015  . GERD (gastroesophageal reflux disease)    Past Surgical History  Procedure Laterality Date  . None    . Esophagogastroduodenoscopy (egd) with esophageal dilation N/A 07/19/2013    Dr. Rourk:normal appearing esophagus s/p dilation, normal D1 and D2, unremarkable path   . Cesarean section N/A 09/29/2014    Procedure: CESAREAN SECTION;  Surgeon: Jonnie Kind, MD;  Location: [facility];  Service: Obstetrics;  Laterality: N/A;   Family  History  Problem Relation Age of Onset  . Multiple sclerosis Mother   . Colon cancer Other     maternal great uncle, age greater than 6  . Heart disease Maternal Grandfather   . Heart disease Paternal Uncle   . Diabetes Paternal Uncle   . Hypertension Paternal Uncle   . Cancer Other     father's aunt had breast cancer   History  Substance Use Topics  . Smoking status: Never Smoker   . Smokeless tobacco: Never Used  . Alcohol Use: No  employed   OB History    Gravida Para Term Preterm AB TAB SAB Ectopic Multiple Living   _0 Review of Systems  Constitutional: Positive for fever and chills.  HENT: Positive for sore throat. Negative for rhinorrhea.   Respiratory: Negative for cough.   Gastrointestinal: Positive for nausea, vomiting and abdominal pain. Negative for diarrhea.  Genitourinary: Positive for dysuria and frequency. Negative for hematuria.  Musculoskeletal: Positive for back pain.  All other systems reviewed and are negative.     Allergies  Lorabid; Latex; and Orange fruit  Home Medications   Prior to Admission medications   Medication Sig Start Date End Date Taking? Authorizing Provider  hyoscyamine (LEVSIN SL) 0.125 MG SL tablet Place 1 tablet (0.125 mg total) under the tongue every 4 (four) hours as needed. 05/08/15  Orvil Feil, NP  Iron-FA-B Cmp-C-Biot-Probiotic (FUSION PLUS) CAPS Take 1 capsule by mouth See admin instructions. 1 time daily between meals 07/10/14   Roma Schanz, CNM  medroxyPROGESTERone (DEPO-PROVERA) 150 MG/ML injection Inject 1 mL (150 mg total) into the muscle every 3 (three) months. 01/28/15   Estill Dooms, NP  peg 3350 powder (MOVIPREP) 100 G SOLR Take 1 kit (200 g total) by mouth once. 05/16/15 06/15/15  Orvil Feil, NP   Triage vitals: BP 119/80 mmHg  Pulse 126  Temp(Src) 100.5 F (38.1 C)  Resp 21  Ht _0  (1.575 m)  Wt 160 lb (72.576 kg)  BMI 29.26 kg/m2  SpO2 99%  Vital signs normal except for fever,  tachycardia  01:32:13 Orthostatic Vital Signs Orthostatic Lying  - BP- Lying: 110/78 mmHg ; Pulse- Lying: 118  Orthostatic Sitting - BP- Sitting: 106/74 mmHg ; Pulse- Sitting: 122  Orthostatic Standing at 0 minutes - BP- Standing at 0 minutes: 103/65 mmHg ; Pulse- Standing at 0 minutes: 125     Orthostatic VS normal except for persistent tachycardia  Physical Exam  Constitutional: She is oriented to person, place, and time. She appears well-developed and well-nourished.  Non-toxic appearance. She does not appear ill. No distress.  HENT:  Head: Normocephalic and atraumatic.  Right Ear: External ear normal.  Left Ear: External ear normal.  Nose: Nose normal. No mucosal edema or rhinorrhea.  Mouth/Throat: Mucous membranes are normal. No dental abscesses or uvula swelling.  Mucus membranes are dry  Eyes: Conjunctivae and EOM are normal. Pupils are equal, round, and reactive to light.  Neck: Normal range of motion and full passive range of motion without pain. Neck supple.  Cardiovascular: Regular rhythm and normal heart sounds.  Tachycardia present.  Exam reveals no gallop and no friction rub.   No murmur heard. HR was 135-145 during my exam  Pulmonary/Chest: Effort normal and breath sounds normal. No respiratory distress. She has no wheezes. She has no rhonchi. She has no rales. She exhibits no tenderness and no crepitus.  Abdominal: Soft. Normal appearance and bowel sounds are normal. She exhibits no distension. There is tenderness. There is no rebound and no guarding.  Tender in the right and left upper quadrant  Genitourinary:  Bilateral CVAT but worse on the right  Musculoskeletal: Normal range of motion. She exhibits no edema or tenderness.  Moves all extremities well.   Neurological: She is alert and oriented to person, place, and time. She has normal strength. No cranial nerve deficit.  Skin: Skin is warm, dry and intact. No rash noted. No erythema. No pallor.  Hot to touch   Psychiatric: She has a normal mood and affect. Her speech is normal and behavior is normal. Her mood appears not anxious.  Nursing note and vitals reviewed.   ED Course  Procedures   Medications  cefTRIAXone (ROCEPHIN) 1 g in dextrose 5 % 50 mL IVPB (not administered)  sodium chloride 0.9 % bolus 1,000 mL (not administered)  0.9 %  sodium chloride infusion (not administered)  sodium chloride 0.9 % bolus 1,000 mL (0 mLs Intravenous Stopped 06/13/15 0131)  sodium chloride 0.9 % bolus 1,000 mL (0 mLs Intravenous Stopped 06/13/15 0036)  cefTRIAXone (ROCEPHIN) 1 g in dextrose 5 % 50 mL IVPB (0 g Intravenous Stopped 06/13/15 0011)  ondansetron (ZOFRAN) injection 4 mg (4 mg Intravenous Given 06/12/15 2339)  sodium chloride 0.9 % bolus 1,000 mL (1,000 mLs Intravenous New Bag/Given 06/13/15 0138)  iohexol (OMNIPAQUE)  300 MG/ML solution 100 mL (100 mLs Intravenous Contrast Given 06/13/15 0254)  ketorolac (TORADOL) 30 MG/ML injection 30 mg (30 mg Intravenous Given 06/13/15 0317)    DIAGNOSTIC STUDIES: Oxygen Saturation is 99% on RA, normal by my interpretation.  COORDINATION OF CARE:  11:15 PM Discussed treatment plan which includes check fever, IV fluids, antibiotics with pt at bedside and pt agreed to plan.  Patient was given IV fluids and was given 1 g Rocephin for presumed UTI with possible pyelonephritis.  Patient was noted to have persistent chills and her temp was in the 99 range. She however had persistent tachycardia despite being given IV fluids. CT abdomen was done to rule out renal abscess or other etiology of her fever.  Patient had originally felt like she could go home. However I have talked to the patient that with for persistent tachycardia and her general appearance of looking like she feels bad she should stay and be admitted and she is agreeable to staying in the hospital. Patient appears short of breath when she walked to the bathroom. Her pulse ox is been 100% so this is most likely from  her tachycardia.  03:26 Dr Shanon Brow, get blood cultures, wants to increase rocephin to 2 grams. Admit to stepdown.   Labs Review Results for orders placed or performed during the hospital encounter of 06/12/15  CBC with Differential  Result Value Ref Range   WBC 15.4 (H) 4.0 - 10.5 K/uL   RBC 5.17 (H) 3.87 - 5.11 MIL/uL   Hemoglobin 10.2 (L) 12.0 - 15.0 g/dL   HCT 33.5 (L) 36.0 - 46.0 %   MCV 64.8 (L) 78.0 - 100.0 fL   MCH 19.7 (L) 26.0 - 34.0 pg   MCHC 30.4 30.0 - 36.0 g/dL   RDW 17.0 (H) 11.5 - 15.5 %   Platelets 326 150 - 400 K/uL   Neutrophils Relative % 85 (H) 43 - 77 %   Neutro Abs 13.0 (H) 1.7 - 7.7 K/uL   Lymphocytes Relative 6 (L) 12 - 46 %   Lymphs Abs 1.0 0.7 - 4.0 K/uL   Monocytes Relative 9 3 - 12 %   Monocytes Absolute 1.4 (H) 0.1 - 1.0 K/uL   Eosinophils Relative 0 0 - 5 %   Eosinophils Absolute 0.0 0.0 - 0.7 K/uL   Basophils Relative 0 0 - 1 %   Basophils Absolute 0.0 0.0 - 0.1 K/uL   RBC Morphology ELLIPTOCYTES   Comprehensive metabolic panel  Result Value Ref Range   Sodium 134 (L) 135 - 145 mmol/L   Potassium 3.2 (L) 3.5 - 5.1 mmol/L   Chloride 103 101 - 111 mmol/L   CO2 21 (L) 22 - 32 mmol/L   Glucose, Bld 107 (H) 65 - 99 mg/dL   BUN 6 6 - 20 mg/dL   Creatinine, Ser 0.97 0.44 - 1.00 mg/dL   Calcium 8.4 (L) 8.9 - 10.3 mg/dL   Total Protein 7.7 6.5 - 8.1 g/dL   Albumin 4.2 3.5 - 5.0 g/dL   AST 16 15 - 41 U/L   ALT 14 14 - 54 U/L   Alkaline Phosphatase 80 38 - 126 U/L   Total Bilirubin 2.5 (H) 0.3 - 1.2 mg/dL   GFR calc non Af Amer >60 >60 mL/min   GFR calc Af Amer >60 >60 mL/min   Anion gap 10 5 - 15  Pregnancy, urine  Result Value Ref Range   Preg Test, Ur NEGATIVE NEGATIVE  Urinalysis, Routine w reflex  microscopic (not at [facility])  Result Value Ref Range   Color, Urine AMBER (A) YELLOW   APPearance HAZY (A) CLEAR   Specific Gravity, Urine 1.015 1.005 - 1.030   pH 5.0 5.0 - 8.0   Glucose, UA 250 (A) NEGATIVE mg/dL   Hgb urine dipstick MODERATE (A)  NEGATIVE   Bilirubin Urine MODERATE (A) NEGATIVE   Ketones, ur NEGATIVE NEGATIVE mg/dL   Protein, ur >300 (A) NEGATIVE mg/dL   Urobilinogen, UA >8.0 (H) 0.0 - 1.0 mg/dL   Nitrite POSITIVE (A) NEGATIVE   Leukocytes, UA LARGE (A) NEGATIVE  Urine microscopic-add on  Result Value Ref Range   Squamous Epithelial / LPF MANY (A) RARE   WBC, UA TOO NUMEROUS TO COUNT <3 WBC/hpf   RBC / HPF 0-2 <3 RBC/hpf   Bacteria, UA MANY (A) RARE  Lactic acid, plasma  Result Value Ref Range   Lactic Acid, Venous 2.1 (HH) 0.5 - 2.0 mmol/L   Laboratory interpretation all normal except for uti,leukocytosis, mild anemia, mild hyponatremia, hypokalemia, mildly positive LA     Imaging Review Ct Abdomen Pelvis W Contrast  06/13/2015   CLINICAL DATA:  Low back pain, onset Monday July 4th.  Fever.  EXAM: CT ABDOMEN AND PELVIS WITH CONTRAST  TECHNIQUE: Multidetector CT imaging of the abdomen and pelvis was performed using the standard protocol following bolus administration of intravenous contrast.  CONTRAST:  165m OMNIPAQUE IOHEXOL 300 MG/ML  SOLN  COMPARISON:  None.  FINDINGS: BODY WALL: No contributory findings.  LOWER CHEST: No contributory findings.  ABDOMEN/PELVIS:  Liver: No focal abnormality.  Biliary: No evidence of biliary obstruction or stone.  Pancreas: Unremarkable.  Spleen: Unremarkable.  Adrenals: Unremarkable.  Kidneys and ureters: There is bilateral urothelial thickening with right perinephric and periureteric edema. There is patchy hypo enhancement of the bilateral renal cortex, greater on the right and best seen on coronal reformats. No stone or hydronephrosis. No abscess.  Bladder: Circumferential thickening with perivesicular fat haziness.  Reproductive: No pathologic findings.  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 The duodenum does not cross the midline. No appendicitis.  Retroperitoneum: Enlarged ileocolic lymph nodes, likely from remote  inflammation given the circumstances.  Peritoneum: No ascites or pneumoperitoneum.  Vascular: No acute abnormality. Reversed SMV and SMA positioning, as above.  OSSEOUS: No acute abnormalities.  IMPRESSION: 1. Cystitis and bilateral pyelonephritis. No hydronephrosis or abscess. 2. Malrotation/non-rotation of bowel.   Electronically Signed   By: JMonte FantasiaM.D.   On: 06/13/2015 03:12     EKG Interpretation None      ED ECG REPORT   Date: 06/13/2015  Rate: 131  Rhythm: sinus tachycardia  QRS Axis: normal  Intervals: QT prolonged  ST/T Wave abnormalities: nonspecific ST changes  Conduction Disutrbances:none  Narrative Interpretation:   Old EKG Reviewed: none available     MDM   Final diagnoses:  Pyelonephritis  Hypokalemia  Nausea and vomiting, vomiting of unspecified type    Plan admission  IRolland Porter MD, FACEP   I personally performed the services described in this documentation, which was scribed in my presence. The recorded information has been reviewed and considered.  IRolland Porter MD, FBarbette Or MD 06/13/15 [phone]

## 2015-06-12 NOTE — ED Notes (Signed)
Pt c/o fever, back pain, chills, n/v and weakness since Monday.

## 2015-06-13 ENCOUNTER — Other Ambulatory Visit: Payer: Self-pay

## 2015-06-13 ENCOUNTER — Other Ambulatory Visit (HOSPITAL_COMMUNITY): Payer: Self-pay

## 2015-06-13 ENCOUNTER — Emergency Department (HOSPITAL_COMMUNITY): Payer: Commercial Managed Care - HMO

## 2015-06-13 ENCOUNTER — Telehealth: Payer: Self-pay

## 2015-06-13 DIAGNOSIS — R509 Fever, unspecified: Secondary | ICD-10-CM | POA: Diagnosis present

## 2015-06-13 DIAGNOSIS — D649 Anemia, unspecified: Secondary | ICD-10-CM | POA: Diagnosis present

## 2015-06-13 DIAGNOSIS — B9620 Unspecified Escherichia coli [E. coli] as the cause of diseases classified elsewhere: Secondary | ICD-10-CM | POA: Diagnosis present

## 2015-06-13 DIAGNOSIS — D509 Iron deficiency anemia, unspecified: Secondary | ICD-10-CM | POA: Diagnosis present

## 2015-06-13 DIAGNOSIS — R112 Nausea with vomiting, unspecified: Secondary | ICD-10-CM

## 2015-06-13 DIAGNOSIS — E876 Hypokalemia: Secondary | ICD-10-CM | POA: Diagnosis present

## 2015-06-13 DIAGNOSIS — J45909 Unspecified asthma, uncomplicated: Secondary | ICD-10-CM | POA: Diagnosis present

## 2015-06-13 DIAGNOSIS — Z800 Family history of malignant neoplasm of digestive organs: Secondary | ICD-10-CM | POA: Diagnosis not present

## 2015-06-13 DIAGNOSIS — A419 Sepsis, unspecified organism: Secondary | ICD-10-CM | POA: Diagnosis present

## 2015-06-13 DIAGNOSIS — E871 Hypo-osmolality and hyponatremia: Secondary | ICD-10-CM | POA: Diagnosis present

## 2015-06-13 DIAGNOSIS — Z820 Family history of epilepsy and other diseases of the nervous system: Secondary | ICD-10-CM | POA: Diagnosis not present

## 2015-06-13 DIAGNOSIS — K219 Gastro-esophageal reflux disease without esophagitis: Secondary | ICD-10-CM | POA: Diagnosis present

## 2015-06-13 DIAGNOSIS — R6521 Severe sepsis with septic shock: Secondary | ICD-10-CM | POA: Diagnosis present

## 2015-06-13 DIAGNOSIS — E860 Dehydration: Secondary | ICD-10-CM | POA: Diagnosis present

## 2015-06-13 DIAGNOSIS — N10 Acute tubulo-interstitial nephritis: Secondary | ICD-10-CM | POA: Diagnosis present

## 2015-06-13 DIAGNOSIS — Z8249 Family history of ischemic heart disease and other diseases of the circulatory system: Secondary | ICD-10-CM | POA: Diagnosis not present

## 2015-06-13 DIAGNOSIS — R17 Unspecified jaundice: Secondary | ICD-10-CM

## 2015-06-13 DIAGNOSIS — Q433 Congenital malformations of intestinal fixation: Secondary | ICD-10-CM

## 2015-06-13 DIAGNOSIS — Z833 Family history of diabetes mellitus: Secondary | ICD-10-CM | POA: Diagnosis not present

## 2015-06-13 DIAGNOSIS — K589 Irritable bowel syndrome without diarrhea: Secondary | ICD-10-CM | POA: Diagnosis present

## 2015-06-13 LAB — COMPREHENSIVE METABOLIC PANEL
ALBUMIN: 3.1 g/dL — AB (ref 3.5–5.0)
ALK PHOS: 69 U/L (ref 38–126)
ALT: 15 U/L (ref 14–54)
AST: 17 U/L (ref 15–41)
Anion gap: 7 (ref 5–15)
BUN: 5 mg/dL — ABNORMAL LOW (ref 6–20)
CALCIUM: 7.4 mg/dL — AB (ref 8.9–10.3)
CO2: 22 mmol/L (ref 22–32)
Chloride: 108 mmol/L (ref 101–111)
Creatinine, Ser: 0.85 mg/dL (ref 0.44–1.00)
Glucose, Bld: 111 mg/dL — ABNORMAL HIGH (ref 65–99)
POTASSIUM: 3.3 mmol/L — AB (ref 3.5–5.1)
SODIUM: 137 mmol/L (ref 135–145)
Total Bilirubin: 1.8 mg/dL — ABNORMAL HIGH (ref 0.3–1.2)
Total Protein: 6.3 g/dL — ABNORMAL LOW (ref 6.5–8.1)

## 2015-06-13 LAB — PROTIME-INR
INR: 1.4 (ref 0.00–1.49)
Prothrombin Time: 17.2 seconds — ABNORMAL HIGH (ref 11.6–15.2)

## 2015-06-13 LAB — CBC
HCT: 30.1 % — ABNORMAL LOW (ref 36.0–46.0)
Hemoglobin: 9.1 g/dL — ABNORMAL LOW (ref 12.0–15.0)
MCH: 19.7 pg — ABNORMAL LOW (ref 26.0–34.0)
MCHC: 30.2 g/dL (ref 30.0–36.0)
MCV: 65.3 fL — ABNORMAL LOW (ref 78.0–100.0)
PLATELETS: 273 10*3/uL (ref 150–400)
RBC: 4.61 MIL/uL (ref 3.87–5.11)
RDW: 17.1 % — AB (ref 11.5–15.5)
WBC: 12.2 10*3/uL — ABNORMAL HIGH (ref 4.0–10.5)

## 2015-06-13 LAB — LACTIC ACID, PLASMA
Lactic Acid, Venous: 2.1 mmol/L (ref 0.5–2.0)
Lactic Acid, Venous: 1 mmol/L (ref 0.5–2.0)

## 2015-06-13 LAB — MAGNESIUM: MAGNESIUM: 1.6 mg/dL — AB (ref 1.7–2.4)

## 2015-06-13 LAB — MRSA PCR SCREENING: MRSA by PCR: NEGATIVE

## 2015-06-13 MED ORDER — DEXTROSE 5 % IV SOLN
1.0000 g | Freq: Once | INTRAVENOUS | Status: AC
  Administered 2015-06-13: 1 g via INTRAVENOUS
  Filled 2015-06-13: qty 10

## 2015-06-13 MED ORDER — SODIUM CHLORIDE 0.9 % IV SOLN
INTRAVENOUS | Status: DC
  Administered 2015-06-13: 04:00:00 via INTRAVENOUS

## 2015-06-13 MED ORDER — PIPERACILLIN-TAZOBACTAM 3.375 G IVPB
INTRAVENOUS | Status: AC
  Filled 2015-06-13: qty 50

## 2015-06-13 MED ORDER — SODIUM CHLORIDE 0.9 % IV SOLN: INTRAVENOUS | Status: DC

## 2015-06-13 MED ORDER — ACETAMINOPHEN 650 MG RE SUPP: 650.0000 mg | Freq: Four times a day (QID) | RECTAL | Status: DC | PRN

## 2015-06-13 MED ORDER — SODIUM CHLORIDE 0.9 % IV BOLUS (SEPSIS)
1000.0000 mL | Freq: Once | INTRAVENOUS | Status: AC
  Administered 2015-06-13: 1000 mL via INTRAVENOUS

## 2015-06-13 MED ORDER — POTASSIUM CHLORIDE IN NACL 20-0.9 MEQ/L-% IV SOLN
INTRAVENOUS | Status: AC
  Administered 2015-06-13: 05:00:00 via INTRAVENOUS

## 2015-06-13 MED ORDER — FENTANYL CITRATE (PF) 100 MCG/2ML IJ SOLN
25.0000 ug | Freq: Once | INTRAMUSCULAR | Status: AC
Start: 2015-06-13 — End: 2015-06-13
  Administered 2015-06-13: 25 ug via INTRAVENOUS

## 2015-06-13 MED ORDER — HYDROMORPHONE HCL 1 MG/ML IJ SOLN
1.0000 mg | INTRAMUSCULAR | Status: DC | PRN
  Administered 2015-06-13 (×2): 1 mg via INTRAVENOUS
  Filled 2015-06-13 (×2): qty 1

## 2015-06-13 MED ORDER — FENTANYL CITRATE (PF) 100 MCG/2ML IJ SOLN
50.0000 ug | INTRAMUSCULAR | Status: DC | PRN
  Administered 2015-06-13 – 2015-06-16 (×10): 50 ug via INTRAVENOUS
  Filled 2015-06-13 (×11): qty 2

## 2015-06-13 MED ORDER — ENOXAPARIN SODIUM 40 MG/0.4ML ~~LOC~~ SOLN
40.0000 mg | SUBCUTANEOUS | Status: DC
  Filled 2015-06-13 (×2): qty 0.4

## 2015-06-13 MED ORDER — MAGNESIUM SULFATE 2 GM/50ML IV SOLN
2.0000 g | Freq: Once | INTRAVENOUS | Status: AC
  Administered 2015-06-13: 2 g via INTRAVENOUS
  Filled 2015-06-13: qty 50

## 2015-06-13 MED ORDER — POTASSIUM CHLORIDE CRYS ER 20 MEQ PO TBCR
40.0000 meq | EXTENDED_RELEASE_TABLET | Freq: Once | ORAL | Status: AC
  Administered 2015-06-13: 40 meq via ORAL
  Filled 2015-06-13: qty 2

## 2015-06-13 MED ORDER — ONDANSETRON HCL 4 MG/2ML IJ SOLN
4.0000 mg | Freq: Four times a day (QID) | INTRAMUSCULAR | Status: DC | PRN
  Administered 2015-06-13 – 2015-06-14 (×2): 4 mg via INTRAVENOUS
  Filled 2015-06-13 (×2): qty 2

## 2015-06-13 MED ORDER — ONDANSETRON HCL 4 MG PO TABS: 4.0000 mg | ORAL_TABLET | Freq: Four times a day (QID) | ORAL | Status: DC | PRN

## 2015-06-13 MED ORDER — PIPERACILLIN-TAZOBACTAM 3.375 G IVPB
3.3750 g | Freq: Three times a day (TID) | INTRAVENOUS | Status: DC
  Administered 2015-06-13 – 2015-06-16 (×10): 3.375 g via INTRAVENOUS
  Filled 2015-06-13 (×14): qty 50

## 2015-06-13 MED ORDER — ACETAMINOPHEN 325 MG PO TABS
650.0000 mg | ORAL_TABLET | Freq: Four times a day (QID) | ORAL | Status: DC | PRN
  Administered 2015-06-13 – 2015-06-15 (×5): 650 mg via ORAL
  Filled 2015-06-13 (×5): qty 2

## 2015-06-13 MED ORDER — SODIUM CHLORIDE 0.9 % IV SOLN
1.0000 g | Freq: Once | INTRAVENOUS | Status: AC
  Administered 2015-06-13: 1 g via INTRAVENOUS
  Filled 2015-06-13: qty 10

## 2015-06-13 MED ORDER — FENTANYL CITRATE (PF) 100 MCG/2ML IJ SOLN
INTRAMUSCULAR | Status: AC
  Filled 2015-06-13: qty 2

## 2015-06-13 MED ORDER — ALUM & MAG HYDROXIDE-SIMETH 200-200-20 MG/5ML PO SUSP: 30.0000 mL | Freq: Four times a day (QID) | ORAL | Status: DC | PRN

## 2015-06-13 MED ORDER — IOHEXOL 300 MG/ML  SOLN
100.0000 mL | Freq: Once | INTRAMUSCULAR | Status: AC | PRN
  Administered 2015-06-13: 100 mL via INTRAVENOUS

## 2015-06-13 MED ORDER — KETOROLAC TROMETHAMINE 30 MG/ML IJ SOLN
30.0000 mg | Freq: Once | INTRAMUSCULAR | Status: AC
  Administered 2015-06-13: 30 mg via INTRAVENOUS
  Filled 2015-06-13: qty 1

## 2015-06-13 NOTE — Progress Notes (Signed)
ANTIBIOTIC CONSULT NOTE-Preliminary  Pharmacy Consult for Zosyn Indication: intra abdominal infection  Allergies  Allergen Reactions  . Lorabid [Loracarbef] Hives and Swelling  . Latex Itching and Rash  . Orange Fruit [Citrus] Rash    Patient Measurements: Height: 5\' 2"  (157.5 cm) Weight: 164 lb 10.9 oz (74.7 kg) IBW/kg (Calculated) : 50.1   Vital Signs: Temp: 98.9 F (37.2 C) (07/07 0518) Temp Source: Oral (07/07 0518) BP: 106/58 mmHg (07/07 0500) Pulse Rate: 123 (07/07 0500)  Labs:  Recent Labs  06/12/15 2235  WBC 15.4*  HGB 10.2*  PLT 326  CREATININE 0.97    Estimated Creatinine Clearance: 86.8 mL/min (by C-G formula based on Cr of 0.97).  No results for input(s): VANCOTROUGH, VANCOPEAK, VANCORANDOM, GENTTROUGH, GENTPEAK, GENTRANDOM, TOBRATROUGH, TOBRAPEAK, TOBRARND, AMIKACINPEAK, AMIKACINTROU, AMIKACIN in the last 72 hours.   Microbiology: Recent Results (from the past 720 hour(s))  Culture, blood (routine x 2)     Status: None (Preliminary result)   Collection Time: 06/13/15  3:06 AM  Result Value Ref Range Status   Specimen Description BLOOD LEFT ANTECUBITAL  Final   Special Requests   Final    BOTTLES DRAWN AEROBIC AND ANAEROBIC AEB 8CC ANA 6CC   Culture PENDING  Incomplete   Report Status PENDING  Incomplete  Culture, blood (routine x 2)     Status: None (Preliminary result)   Collection Time: 06/13/15  3:34 AM  Result Value Ref Range Status   Specimen Description BLOOD LEFT ANTECUBITAL  Final   Special Requests BOTTLES DRAWN AEROBIC AND ANAEROBIC 10CC EACH  Final   Culture PENDING  Incomplete   Report Status PENDING  Incomplete    Medical History: Past Medical History  Diagnosis Date  . Asthma   . Pelvic pain in female 01/11/2015  . Nausea and vomiting 01/11/2015  . Endometritis 01/11/2015  . Ovarian cyst 01/14/2015  . History of ovarian cyst 01/28/2015  . GERD (gastroesophageal reflux disease)     Medications:  Scheduled:  . enoxaparin  (LOVENOX) injection  40 mg Subcutaneous Q24H  . piperacillin-tazobactam (ZOSYN)  IV  3.375 g Intravenous 3 times per day    Assessment: 19 yo female with bilateral acute pyelonephritis. Hives to lorabid, has taken amoxicillin in past. Treat with zosyn.   Goal of Therapy:  Eradication of infection  Plan:  Preliminary review of pertinent patient information completed.  Protocol will be initiated with zosyn 3.375 grams Q 8 hours.  Forestine Na clinical pharmacist will complete review during morning rounds to assess patient and finalize treatment regimen.  Emrey Thornley, Magdalene Molly, [place] 06/13/2015,5:44 AM

## 2015-06-13 NOTE — Telephone Encounter (Signed)
Routing to EG 

## 2015-06-13 NOTE — Telephone Encounter (Signed)
Reviewed patient's IP record. Is in the ICU at least through today for bilateral pyelonephritis sepsis. Has been febrile within the past 24 hours. Last set of VS show patient remains tachycardic (HR 123) with soft BPs (100/83). Per hospitalist she will remain in ICU through today. At this time it is likely best to hold her elective procedures (dyspepsia with NSAID use and irritable bowel) and plan for outpatient follow-up and rescheduling when she is less ill.

## 2015-06-13 NOTE — Telephone Encounter (Signed)
Routing to [place] for follow-up

## 2015-06-13 NOTE — Care Management Note (Signed)
Case Management Note  Patient Details  Name: Melissa Livingston MRN: [id number] Date of Birth: December 23, 1995  Expected Discharge Date:                  Expected Discharge Plan:  Home/Self Care  In-House Referral:  NA  Discharge planning Services  CM Consult  Post Acute Care Choice:  NA Choice offered to:  NA  DME Arranged:    DME Agency:     HH Arranged:    [place] Agency:     Status of Service:  Completed, signed off  Medicare Important Message Given:    Date Medicare IM Given:    Medicare IM give by:    Date Additional Medicare IM Given:    Additional Medicare Important Message give by:     If discussed at [place] of Stay Meetings, dates discussed:    Additional Comments: Pt is from home and independent with ADL's. Pt plans to discharge home with self care. No CM needs.  Sherald Barge, RN 06/13/2015, 3:08 PM

## 2015-06-13 NOTE — H&P (Signed)
PCP:   Sallee Lange, MD   Chief Complaint:  N/v, back pain  HPI: 19 yo female comes in with fevers, chills, dysuria, increased in urinary frequency since Saturday (4 days ago).  She has progressively gotten worse and started vomiting today.  Started with bilateral flank pain on Monday which has also worsened.  Her pain was 10/10 pain, was given toradol which helped to 8/10, given fentanyl also which helped to 7/10.  Still in significant amount of pain, but she doesn't really complain a lot or look like she is in a lot of pain.  She has remained tachycardic in the 130s despite 3 liters of ivf in the ED.  Ct shows bilateral pyelonephritis.  No recent abx.  She is 8 months postpartum.  Did not have any kidney /bladder issues with her pregnancy, or ever in the past.  Allergy to lorabid as a child which is a second generation cephalosporin was hives/swelling.  Per epic chart has had ancef before.  Also azithromycin, flagyl, doxycycline and amoxicillin taken before with no difficulty.   Has never had rocephin or keflex before.  Not on levaquin before but recalls taking cipro in past.    Review of Systems:  Positive and negative as per HPI otherwise all other systems are negative  Past Medical History: Past Medical History  Diagnosis Date  . Asthma   . Pelvic pain in female 01/11/2015  . Nausea and vomiting 01/11/2015  . Endometritis 01/11/2015  . Ovarian cyst 01/14/2015  . History of ovarian cyst 01/28/2015  . GERD (gastroesophageal reflux disease)    Past Surgical History  Procedure Laterality Date  . None    . Esophagogastroduodenoscopy (egd) with esophageal dilation N/A 07/19/2013    Dr. Rourk:normal appearing esophagus s/p dilation, normal D1 and D2, unremarkable path   . Cesarean section N/A 09/29/2014    Procedure: CESAREAN SECTION;  Surgeon: Jonnie Kind, MD;  Location: [facility];  Service: Obstetrics;  Laterality: N/A;    Medications: Prior to Admission medications   Medication Sig  Start Date End Date Taking? Authorizing Provider  hyoscyamine (LEVSIN SL) 0.125 MG SL tablet Place 1 tablet (0.125 mg total) under the tongue every 4 (four) hours as needed. 05/08/15   Orvil Feil, NP  Iron-FA-B Cmp-C-Biot-Probiotic (FUSION PLUS) CAPS Take 1 capsule by mouth See admin instructions. 1 time daily between meals 07/10/14   Roma Schanz, CNM  medroxyPROGESTERone (DEPO-PROVERA) 150 MG/ML injection Inject 1 mL (150 mg total) into the muscle every 3 (three) months. 01/28/15   Estill Dooms, NP  peg 3350 powder (MOVIPREP) 100 G SOLR Take 1 kit (200 g total) by mouth once. 05/16/15 06/15/15  Orvil Feil, NP    Allergies:   Allergies  Allergen Reactions  . Lorabid [Loracarbef] Hives and Swelling  . Latex Itching and Rash  . Orange Fruit [Citrus] Rash    Social History:  reports that she has never smoked. She has never used smokeless tobacco. She reports that she does not drink alcohol or use illicit drugs.  Family History: Family History  Problem Relation Age of Onset  . Multiple sclerosis Mother   . Colon cancer Other     maternal great uncle, age greater than 74  . Heart disease Maternal Grandfather   . Heart disease Paternal Uncle   . Diabetes Paternal Uncle   . Hypertension Paternal Uncle   . Cancer Other     father's aunt had breast cancer    Physical Exam:  Filed Vitals:   06/13/15 0132 06/13/15 0223 06/13/15 0315 06/13/15 0330  BP:  104/65 110/64 106/64  Pulse:  148 138 129  Temp: 98.7 F (37.1 C) 99.2 F (37.3 C) 99.1 F (37.3 C)   TempSrc:  Oral Oral   Resp: 18 22 20    Height:      Weight:      SpO2:  100% 100% 93%   General appearance: alert, cooperative and no distress Head: Normocephalic, without obvious abnormality, atraumatic Eyes: negative Nose: Nares normal. Septum midline. Mucosa normal. No drainage or sinus tenderness. Neck: no JVD and supple, symmetrical, trachea midline Lungs: clear to auscultation bilaterally Heart: regular rate and  rhythm tachycardic Abdomen: soft, non-tender; bowel sounds normal; no masses,  no organomegaly  Bilateral cva tenderness Extremities: extremities normal, atraumatic, no cyanosis or edema Pulses: 2+ and symmetric Skin: Skin color, texture, turgor normal. No rashes or lesions Neurologic: Grossly normal  Labs on Admission:   Recent Labs  06/12/15 2235 06/13/15 0303  NA 134*  --   K 3.2*  --   CL 103  --   CO2 21*  --   GLUCOSE 107*  --   BUN 6  --   CREATININE 0.97  --   CALCIUM 8.4*  --   MG  --  1.6*    Recent Labs  06/12/15 2235  AST 16  ALT 14  ALKPHOS 80  BILITOT 2.5*  PROT 7.7  ALBUMIN 4.2    Recent Labs  06/12/15 2235  WBC 15.4*  NEUTROABS 13.0*  HGB 10.2*  HCT 33.5*  MCV 64.8*  PLT 326   Radiological Exams on Admission: Ct Abdomen Pelvis W Contrast  06/13/2015   CLINICAL DATA:  Low back pain, onset Monday July 4th.  Fever.  EXAM: CT ABDOMEN AND PELVIS WITH CONTRAST  TECHNIQUE: Multidetector CT imaging of the abdomen and pelvis was performed using the standard protocol following bolus administration of intravenous contrast.  CONTRAST:  165m OMNIPAQUE IOHEXOL 300 MG/ML  SOLN  COMPARISON:  None.  FINDINGS: BODY WALL: No contributory findings.  LOWER CHEST: No contributory findings.  ABDOMEN/PELVIS:  Liver: No focal abnormality.  Biliary: No evidence of biliary obstruction or stone.  Pancreas: Unremarkable.  Spleen: Unremarkable.  Adrenals: Unremarkable.  Kidneys and ureters: There is bilateral urothelial thickening with right perinephric and periureteric edema. There is patchy hypo enhancement of the bilateral renal cortex, greater on the right and best seen on coronal reformats. No stone or hydronephrosis. No abscess.  Bladder: Circumferential thickening with perivesicular fat haziness.  Reproductive: No pathologic findings.  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 The duodenum does not cross the midline. No  appendicitis.  Retroperitoneum: Enlarged ileocolic lymph nodes, likely from remote inflammation given the circumstances.  Peritoneum: No ascites or pneumoperitoneum.  Vascular: No acute abnormality. Reversed SMV and SMA positioning, as above.  OSSEOUS: No acute abnormalities.  IMPRESSION: 1. Cystitis and bilateral pyelonephritis. No hydronephrosis or abscess. 2. Malrotation/non-rotation of bowel.   Electronically Signed   By: JMonte FantasiaM.D.   On: 06/13/2015 03:12   Old chart reviewed Case discussed with dr kTomi Bambergeredp  Assessment/Plan  19yo female with bilateral acute pyelonephritis  Principal Problem:   Pyelonephritis, acute-  Has received 2 gm of rocephin in the ED without any issues yet.  Will change to zosyn for now due to her allergy to lorabid which im assuming is an old antibiotic.  Ivf.  Give more pain medications.  i think her tachycardia is more to pain (must have a high pain tolerance and does not complain a lot) than from sepsis.  Urine and blood cultures done.  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  No issue at this time.   Hypokalemia-  Replete in ivf   Normocytic anemia- no overt bleeding, outpt w/u, likely iron deficient   Total bilirubin, elevated-  Repeat in am , monitor, lfts/alk phos are normal   Hyponatremia-  Ivf, due to dehydration   Hypomagnesemia-  Give 2 gm mag sulfate iv once.  Admit to stepdown.  Full code.  Merit Maybee A 06/13/2015, 4:09 AM

## 2015-06-13 NOTE — Progress Notes (Signed)
[facility] PROGRESS NOTE  Melissa Livingston MAU:[id number] DOB: Aug 28, 1996    PCP:   Sallee Lange, MD   HPI: Melissa Livingston is an 19 y.o. female admitted yesterday for bilateral pyelonephritis, likely early sepsis, with hypokalemia, hypocalcemia, tachycardia and hypotension.  She received over 3 L of IVF, and has normotensive at this time.  Alert, orient, and is feeling better. She was given IV Rocephin, then maintained on IV Zosyn at this time.   Rewiew of Systems:  Constitutional: Negative for malaise, fever and chills. No significant weight loss or weight gain Eyes: Negative for eye pain, redness and discharge, diplopia, visual changes, or flashes of light. ENMT: Negative for ear pain, hoarseness, nasal congestion, sinus pressure and sore throat. No headaches; tinnitus, drooling, or problem swallowing. Cardiovascular: Negative for chest pain, palpitations, diaphoresis, dyspnea and peripheral edema. ; No orthopnea, PND Respiratory: Negative for cough, hemoptysis, wheezing and stridor. No pleuritic chestpain. Gastrointestinal: Negative for nausea, vomiting, diarrhea, constipation, abdominal pain, melena, blood in stool, hematemesis, jaundice and rectal bleeding.    Genitourinary: Negative for frequency, dysuria, incontinence,flank pain and hematuria; Musculoskeletal: Negative for back pain and neck pain. Negative for swelling and trauma.;  Skin: . Negative for pruritus, rash, abrasions, bruising and skin lesion.; ulcerations Neuro: Negative for headache, lightheadedness and neck stiffness. Negative for weakness, altered level of consciousness , altered mental status, extremity weakness, burning feet, involuntary movement, seizure and syncope.  Psych: negative for anxiety, depression, insomnia, tearfulness, panic attacks, hallucinations, paranoia, suicidal or homicidal ideation    Past Medical History  Diagnosis Date  . Asthma   . Pelvic pain in female 01/11/2015  . Nausea and vomiting 01/11/2015   . Endometritis 01/11/2015  . Ovarian cyst 01/14/2015  . History of ovarian cyst 01/28/2015  . GERD (gastroesophageal reflux disease)     Past Surgical History  Procedure Laterality Date  . None    . Esophagogastroduodenoscopy (egd) with esophageal dilation N/A 07/19/2013    Dr. Rourk:normal appearing esophagus s/p dilation, normal D1 and D2, unremarkable path   . Cesarean section N/A 09/29/2014    Procedure: CESAREAN SECTION;  Surgeon: Jonnie Kind, MD;  Location: [facility];  Service: Obstetrics;  Laterality: N/A;    Medications:  HOME MEDS: Prior to Admission medications   Medication Sig Start Date End Date Taking? Authorizing Provider  hyoscyamine (LEVSIN SL) 0.125 MG SL tablet Place 1 tablet (0.125 mg total) under the tongue every 4 (four) hours as needed. 05/08/15   Orvil Feil, NP  Iron-FA-B Cmp-C-Biot-Probiotic (FUSION PLUS) CAPS Take 1 capsule by mouth See admin instructions. 1 time daily between meals 07/10/14   Roma Schanz, CNM  medroxyPROGESTERone (DEPO-PROVERA) 150 MG/ML injection Inject 1 mL (150 mg total) into the muscle every 3 (three) months. 01/28/15   Estill Dooms, NP  peg 3350 powder (MOVIPREP) 100 G SOLR Take 1 kit (200 g total) by mouth once. 05/16/15 06/15/15  Orvil Feil, NP     Allergies:  Allergies  Allergen Reactions  . Lorabid [Loracarbef] Hives and Swelling  . Latex Itching and Rash  . Orange Fruit [Citrus] Rash    Social History:   reports that she has never smoked. She has never used smokeless tobacco. She reports that she does not drink alcohol or use illicit drugs.  Family History: Family History  Problem Relation Age of Onset  . Multiple sclerosis Mother   . Colon cancer Other     maternal great uncle, age greater than 44  . Heart  disease Maternal Grandfather   . Heart disease Paternal Uncle   . Diabetes Paternal Uncle   . Hypertension Paternal Uncle   . Cancer Other     father's aunt had breast cancer     Physical Exam: Filed  Vitals:   06/13/15 0530 06/13/15 0545 06/13/15 0600 06/13/15 0700  BP: 125/85  120/84 100/83  Pulse: 135 125 116 123  Temp:      TempSrc:      Resp: 21 18 15 19   Height:      Weight:      SpO2: 98% 98% 98% 100%   Blood pressure 100/83, pulse 123, temperature 98.9 F (37.2 C), temperature source Oral, resp. rate 19, height 5' 2"  (1.575 m), weight 74.7 kg (164 lb 10.9 oz), SpO2 100 %, not currently breastfeeding.  GEN:  Pleasant  patient lying in the stretcher in no acute distress; cooperative with exam. PSYCH:  alert and oriented x4; does not appear anxious or depressed; affect is appropriate. HEENT: Mucous membranes pink and anicteric; PERRLA; EOM intact; no cervical lymphadenopathy nor thyromegaly or carotid bruit; no JVD; There were no stridor. Neck is very supple. Breasts:: Not examined CHEST WALL: No tenderness CHEST: Normal respiration, clear to auscultation bilaterally.  HEART: Regular rate and rhythm.  There are no murmur, rub, or gallops.   BACK: No kyphosis or scoliosis; no CVA tenderness ABDOMEN: soft and slightly tender, no masses, no organomegaly, normal abdominal bowel sounds; no pannus; no intertriginous candida. There is no rebound and no distention. Rectal Exam: Not done EXTREMITIES: No bone or joint deformity; age-appropriate arthropathy of the hands and knees; no edema; no ulcerations.  There is no calf tenderness. Genitalia: not examined PULSES: 2+ and symmetric SKIN: Normal hydration no rash or ulceration CNS: Cranial nerves 2-12 grossly intact no focal lateralizing neurologic deficit.  Speech is fluent; uvula elevated with phonation, facial symmetry and tongue midline. DTR are normal bilaterally, cerebella exam is intact, barbinski is negative and strengths are equaled bilaterally.  No sensory loss.   Labs on Admission:  Basic Metabolic Panel:  Recent Labs Lab 06/12/15 2235 06/13/15 0303 06/13/15 0525  NA 134*  --  137  K 3.2*  --  3.3*  CL 103  --  108   CO2 21*  --  22  GLUCOSE 107*  --  111*  BUN 6  --  5*  CREATININE 0.97  --  0.85  CALCIUM 8.4*  --  7.4*  MG  --  1.6*  --    Liver Function Tests:  Recent Labs Lab 06/12/15 2235 06/13/15 0525  AST 16 17  ALT 14 15  ALKPHOS 80 69  BILITOT 2.5* 1.8*  PROT 7.7 6.3*  ALBUMIN 4.2 3.1*   CBC:  Recent Labs Lab 06/12/15 2235 06/13/15 0525  WBC 15.4* 12.2*  NEUTROABS 13.0*  --   HGB 10.2* 9.1*  HCT 33.5* 30.1*  MCV 64.8* 65.3*  PLT 326 273    Radiological Exams on Admission: Ct Abdomen Pelvis W Contrast  06/13/2015   CLINICAL DATA:  Low back pain, onset Monday July 4th.  Fever.  EXAM: CT ABDOMEN AND PELVIS WITH CONTRAST  TECHNIQUE: Multidetector CT imaging of the abdomen and pelvis was performed using the standard protocol following bolus administration of intravenous contrast.  CONTRAST:  153m OMNIPAQUE IOHEXOL 300 MG/ML  SOLN  COMPARISON:  None.  FINDINGS: BODY WALL: No contributory findings.  LOWER CHEST: No contributory findings.  ABDOMEN/PELVIS:  Liver: No focal abnormality.  Biliary: No  evidence of biliary obstruction or stone.  Pancreas: Unremarkable.  Spleen: Unremarkable.  Adrenals: Unremarkable.  Kidneys and ureters: There is bilateral urothelial thickening with right perinephric and periureteric edema. There is patchy hypo enhancement of the bilateral renal cortex, greater on the right and best seen on coronal reformats. No stone or hydronephrosis. No abscess.  Bladder: Circumferential thickening with perivesicular fat haziness.  Reproductive: No pathologic findings.  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 The duodenum does not cross the midline. No appendicitis.  Retroperitoneum: Enlarged ileocolic lymph nodes, likely from remote inflammation given the circumstances.  Peritoneum: No ascites or pneumoperitoneum.  Vascular: No acute abnormality. Reversed SMV and SMA positioning, as above.  OSSEOUS: No acute abnormalities.   IMPRESSION: 1. Cystitis and bilateral pyelonephritis. No hydronephrosis or abscess. 2. Malrotation/non-rotation of bowel.   Electronically Signed   By: Monte Fantasia M.D.   On: 06/13/2015 03:12   Assessment/Plan Present on Admission:  . Nausea and vomiting . Pyelonephritis, acute . Asthma . Irritable bowel syndrome . Hypokalemia . Normocytic anemia . Total bilirubin, elevated . Hyponatremia . Hypomagnesemia  PLAN:  Will keep her in ICU today.  Continue with IV Zosyn, IVF, replete her electrolytes, and give PRN pain medication.   Follow up on cultures tomorrow.  Total bili is going down, with normal LFTs, I didn't see fractionation, but probably [place]. She can eat.   Thanks.  Other plans as per orders.  Code Status: FULL Haskel Khan, MD. [facility] Pager [phone] 7pm to 7am.  06/13/2015, 8:07 AM

## 2015-06-13 NOTE — Telephone Encounter (Signed)
This report needs to go to hospital-based extend today. They can check out the inpatient and figure it out. Thanks.

## 2015-06-13 NOTE — Telephone Encounter (Signed)
NOTED

## 2015-06-13 NOTE — Telephone Encounter (Signed)
Pt's mother called this morning to inform us that she is in the hospital [facility]). She is scheduled to have a TCS/EGD Friday. They are wanting to know if she is still going to be able to have the TCS/EGD. Please advise . She is in ICU 13for now but will be moving shortly to another room.

## 2015-06-13 NOTE — Telephone Encounter (Signed)
Agree. Patient needs to get over her acute illness. She will need to come back to the office for reassessment prior to scheduling proposed elective procedures

## 2015-06-13 NOTE — Progress Notes (Signed)
ANTIBIOTIC CONSULT NOTE- follow up  Pharmacy Consult for Zosyn Indication: intra abdominal infection  Allergies  Allergen Reactions  . Lorabid [Loracarbef] Hives and Swelling  . Latex Itching and Rash  . Orange Fruit [Citrus] Rash   Patient Measurements: Height: 5\' 2"  (157.5 cm) Weight: 164 lb 10.9 oz (74.7 kg) IBW/kg (Calculated) : 50.1  Vital Signs: Temp: 98.9 F (37.2 C) (07/07 0518) Temp Source: Oral (07/07 0518) BP: 100/83 mmHg (07/07 0700) Pulse Rate: 123 (07/07 0700)  Labs:  Recent Labs  06/12/15 2235 06/13/15 0525  WBC 15.4* 12.2*  HGB 10.2* 9.1*  PLT 326 273  CREATININE 0.97 0.85   Estimated Creatinine Clearance: 99 mL/min (by C-G formula based on Cr of 0.85).  No results for input(s): VANCOTROUGH, VANCOPEAK, VANCORANDOM, GENTTROUGH, GENTPEAK, GENTRANDOM, TOBRATROUGH, TOBRAPEAK, TOBRARND, AMIKACINPEAK, AMIKACINTROU, AMIKACIN in the last 72 hours.   Microbiology: Recent Results (from the past 720 hour(s))  Culture, blood (routine x 2)     Status: None (Preliminary result)   Collection Time: 06/13/15  3:06 AM  Result Value Ref Range Status   Specimen Description BLOOD LEFT ANTECUBITAL  Final   Special Requests   Final    BOTTLES DRAWN AEROBIC AND ANAEROBIC AEB 8CC ANA [place]   Culture PENDING  Incomplete   Report Status PENDING  Incomplete  Culture, blood (routine x 2)     Status: None (Preliminary result)   Collection Time: 06/13/15  3:34 AM  Result Value Ref Range Status   Specimen Description BLOOD LEFT ANTECUBITAL  Final   Special Requests BOTTLES DRAWN AEROBIC AND ANAEROBIC 10CC EACH  Final   Culture PENDING  Incomplete   Report Status PENDING  Incomplete  MRSA PCR Screening     Status: None   Collection Time: 06/13/15  4:58 AM  Result Value Ref Range Status   MRSA by PCR NEGATIVE NEGATIVE Final    Comment:        The GeneXpert MRSA Assay (FDA approved for NASAL specimens only), is one component of a comprehensive MRSA colonization surveillance  program. It is not intended to diagnose MRSA infection nor to guide or monitor treatment for MRSA infections.    Medical History: Past Medical History  Diagnosis Date  . Asthma   . Pelvic pain in female 01/11/2015  . Nausea and vomiting 01/11/2015  . Endometritis 01/11/2015  . Ovarian cyst 01/14/2015  . History of ovarian cyst 01/28/2015  . GERD (gastroesophageal reflux disease)    Medications:  Scheduled:  . enoxaparin (LOVENOX) injection  40 mg Subcutaneous Q24H  . piperacillin-tazobactam (ZOSYN)  IV  3.375 g Intravenous 3 times per day   Assessment: 19 yo female with bilateral acute pyelonephritis. Hives to lorabid, has taken amoxicillin in past. Treat with zosyn.   Goal of Therapy:  Eradication of infection  Plan:  Zosyn 3.375gm IV q8h, each dose over 4 hrs Monitor labs, renal fxn, c/s Deescalate ABX to PO when improved / appropriate  Hart Robinsons A, RPH 06/13/2015,7:57 AM

## 2015-06-13 NOTE — ED Notes (Signed)
CRITICAL VALUE ALERT  Critical value received:  Lactic acid 2.1  Date of notification:  06-13-2015  Time of notification:  03:45  Critical value read back: yes  Nurse who received alert:  Rip Harbour Gerrod Maule RN   MD notified (1st page):  Dr Rolland Porter  Time of first page:  03:45  MD notified (2nd page):  Time of second page:  Responding MD:  Dr Rolland Porter  Time MD responded:  03:46

## 2015-06-14 ENCOUNTER — Ambulatory Visit (HOSPITAL_COMMUNITY): Admission: RE | Admit: 2015-06-14 | Payer: Medicaid Other | Source: Ambulatory Visit | Admitting: Internal Medicine

## 2015-06-14 ENCOUNTER — Encounter (HOSPITAL_COMMUNITY): Admission: RE | Payer: Self-pay | Source: Ambulatory Visit

## 2015-06-14 LAB — BASIC METABOLIC PANEL
ANION GAP: 9 (ref 5–15)
BUN: <5 mg/dL — ABNORMAL LOW (ref 6–20)
CALCIUM: 8.3 mg/dL — AB (ref 8.9–10.3)
CHLORIDE: 108 mmol/L (ref 101–111)
CO2: 20 mmol/L — AB (ref 22–32)
CREATININE: 0.97 mg/dL (ref 0.44–1.00)
GFR calc Af Amer: >60 mL/min (ref >60)
GFR calc non Af Amer: >60 mL/min (ref >60)
GLUCOSE: 105 mg/dL — AB (ref 65–99)
Potassium: 4.2 mmol/L (ref 3.5–5.1)
Sodium: 137 mmol/L (ref 135–145)

## 2015-06-14 LAB — CBC WITH DIFFERENTIAL/PLATELET
Basophils Absolute: 0 10*3/uL (ref 0.0–0.1)
Basophils Relative: 0 % (ref 0–1)
Eosinophils Absolute: 0.1 10*3/uL (ref 0.0–0.7)
Eosinophils Relative: 1 % (ref 0–5)
HEMATOCRIT: 30.4 % — AB (ref 36.0–46.0)
Hemoglobin: 9.1 g/dL — ABNORMAL LOW (ref 12.0–15.0)
LYMPHS ABS: 0.9 10*3/uL (ref 0.7–4.0)
LYMPHS PCT: 10 % — AB (ref 12–46)
MCH: 19.4 pg — ABNORMAL LOW (ref 26.0–34.0)
MCHC: 29.9 g/dL — ABNORMAL LOW (ref 30.0–36.0)
MCV: 65 fL — AB (ref 78.0–100.0)
MONO ABS: 0.7 10*3/uL (ref 0.1–1.0)
Monocytes Relative: 7 % (ref 3–12)
NEUTROS ABS: 7.7 10*3/uL (ref 1.7–7.7)
Neutrophils Relative %: 83 % — ABNORMAL HIGH (ref 43–77)
Platelets: 322 10*3/uL (ref 150–400)
RBC: 4.68 MIL/uL (ref 3.87–5.11)
RDW: 17.3 % — ABNORMAL HIGH (ref 11.5–15.5)
WBC: 9.4 10*3/uL (ref 4.0–10.5)

## 2015-06-14 SURGERY — COLONOSCOPY
Anesthesia: Moderate Sedation

## 2015-06-14 MED ORDER — KCL IN DEXTROSE-NACL 20-5-0.9 MEQ/L-%-% IV SOLN
INTRAVENOUS | Status: DC
  Administered 2015-06-14 (×2): via INTRAVENOUS

## 2015-06-14 MED ORDER — SODIUM CHLORIDE 0.9 % IV BOLUS (SEPSIS)
1000.0000 mL | INTRAVENOUS | Status: AC
  Administered 2015-06-14 (×2): 1000 mL via INTRAVENOUS

## 2015-06-14 MED ORDER — SODIUM CHLORIDE 0.9 % IV BOLUS (SEPSIS)
1000.0000 mL | Freq: Once | INTRAVENOUS | Status: AC
  Administered 2015-06-14: 1000 mL via INTRAVENOUS

## 2015-06-14 NOTE — Progress Notes (Signed)
[facility] PROGRESS NOTE  Melissa Livingston FWY:[id number] DOB: Jul 14, 1996    PCP:   Sallee Lange, MD   HPI: Melissa Livingston is an 19 y.o. female admitted yesterday for bilateral pyelonephritis, likely early sepsis, with hypokalemia, hypocalcemia, tachycardia and hypotension. She still has borderline hypotension and tachycardia.  She is alert, orient, and is feeling better. She was given IV Rocephin, then maintained on IV Zosyn at this time.   Rewiew of Systems:  Constitutional: Negative for malaise, fever and chills. No significant weight loss or weight gain Eyes: Negative for eye pain, redness and discharge, diplopia, visual changes, or flashes of light. ENMT: Negative for ear pain, hoarseness, nasal congestion, sinus pressure and sore throat. No headaches; tinnitus, drooling, or problem swallowing. Cardiovascular: Negative for chest pain, palpitations, diaphoresis, dyspnea and peripheral edema. ; No orthopnea, PND Respiratory: Negative for cough, hemoptysis, wheezing and stridor. No pleuritic chestpain. Gastrointestinal: Negative for nausea, vomiting, diarrhea, constipation, abdominal pain, melena, blood in stool, hematemesis, jaundice and rectal bleeding.    Genitourinary: Negative for frequency, dysuria, incontinence,flank pain and hematuria; Musculoskeletal: Negative for back pain and neck pain. Negative for swelling and trauma.;  Skin: . Negative for pruritus, rash, abrasions, bruising and skin lesion.; ulcerations Neuro: Negative for headache, lightheadedness and neck stiffness. Negative for weakness, altered level of consciousness , altered mental status, extremity weakness, burning feet, involuntary movement, seizure and syncope.  Psych: negative for anxiety, depression, insomnia, tearfulness, panic attacks, hallucinations, paranoia, suicidal or homicidal ideation    Past Medical History  Diagnosis Date  . Asthma   . Pelvic pain in female 01/11/2015  . Nausea and vomiting 01/11/2015  .  Endometritis 01/11/2015  . Ovarian cyst 01/14/2015  . History of ovarian cyst 01/28/2015  . GERD (gastroesophageal reflux disease)     Past Surgical History  Procedure Laterality Date  . None    . Esophagogastroduodenoscopy (egd) with esophageal dilation N/A 07/19/2013    Dr. Rourk:normal appearing esophagus s/p dilation, normal D1 and D2, unremarkable path   . Cesarean section N/A 09/29/2014    Procedure: CESAREAN SECTION;  Surgeon: Jonnie Kind, MD;  Location: [facility];  Service: Obstetrics;  Laterality: N/A;    Medications:  HOME MEDS: Prior to Admission medications   Medication Sig Start Date End Date Taking? Authorizing Provider  acetaminophen (TYLENOL) 500 MG tablet Take 1,000 mg by mouth every 6 (six) hours as needed.   Yes Historical Provider, MD  hyoscyamine (LEVSIN SL) 0.125 MG SL tablet Place 1 tablet (0.125 mg total) under the tongue every 4 (four) hours as needed. 05/08/15  Yes Orvil Feil, NP  Iron-FA-B Cmp-C-Biot-Probiotic (FUSION PLUS) CAPS Take 1 capsule by mouth See admin instructions. 1 time daily between meals 07/10/14  Yes Roma Schanz, CNM  medroxyPROGESTERone (DEPO-PROVERA) 150 MG/ML injection Inject 1 mL (150 mg total) into the muscle every 3 (three) months. 01/28/15  Yes Estill Dooms, NP  peg 3350 powder (MOVIPREP) 100 G SOLR Take 1 kit (200 g total) by mouth once. 05/16/15 06/15/15 Yes Orvil Feil, NP     Allergies:  Allergies  Allergen Reactions  . Lorabid [Loracarbef] Hives and Swelling  . Latex Itching and Rash  . Orange Fruit [Citrus] Rash    Social History:   reports that she has never smoked. She has never used smokeless tobacco. She reports that she does not drink alcohol or use illicit drugs.  Family History: Family History  Problem Relation Age of Onset  . Multiple sclerosis Mother   .  Colon cancer Other     maternal great uncle, age greater than 84  . Heart disease Maternal Grandfather   . Heart disease Paternal Uncle   . Diabetes  Paternal Uncle   . Hypertension Paternal Uncle   . Cancer Other     father's aunt had breast cancer     Physical Exam: Filed Vitals:   06/14/15 0400 06/14/15 0500 06/14/15 0600 06/14/15 0810  BP: 105/72  94/49   Pulse: 112 127 119   Temp: 103 F (39.4 C) 99.9 F (37.7 C)  98.9 F (37.2 C)  TempSrc: Oral Oral  Oral  Resp: _0 Height:      Weight:  57.1 kg (125 lb 14.1 oz)    SpO2: 97% 100% 98%    Blood pressure 94/49, pulse 119, temperature 98.9 F (37.2 C), temperature source Oral, resp. rate 19, height _1  (1.575 m), weight 57.1 kg (125 lb 14.1 oz), SpO2 98 %, not currently breastfeeding.  GEN:  Pleasant  patient lying in the stretcher in no acute distress; cooperative with exam. PSYCH:  alert and oriented x4; does not appear anxious or depressed; affect is appropriate. HEENT: Mucous membranes pink and anicteric; PERRLA; EOM intact; no cervical lymphadenopathy nor thyromegaly or carotid bruit; no JVD; There were no stridor. Neck is very supple. Breasts:: Not examined CHEST WALL: No tenderness CHEST: Normal respiration, clear to auscultation bilaterally.  HEART: Regular rate and rhythm.  There are no murmur, rub, or gallops.   BACK: No kyphosis or scoliosis; no CVA tenderness ABDOMEN: soft and slightly tender diffusely, but no rebound.  No masses, no organomegaly, normal abdominal bowel sounds; no pannus; no intertriginous candida. There is no rebound and no distention. Rectal Exam: Not done EXTREMITIES: No bone or joint deformity; age-appropriate arthropathy of the hands and knees; no edema; no ulcerations.  There is no calf tenderness. Genitalia: not examined PULSES: 2+ and symmetric SKIN: Normal hydration no rash or ulceration CNS: Cranial nerves 2-12 grossly intact no focal lateralizing neurologic deficit.  Speech is fluent; uvula elevated with phonation, facial symmetry and tongue midline. DTR are normal bilaterally, cerebella exam is intact, barbinski is negative  and strengths are equaled bilaterally.  No sensory loss.   Labs on Admission:  Basic Metabolic Panel:  Recent Labs Lab 06/12/15 2235 06/13/15 0303 06/13/15 0525 06/14/15 0449  NA 134*  --  137 137  K 3.2*  --  3.3* 4.2  CL 103  --  108 108  CO2 21*  --  22 20*  GLUCOSE 107*  --  111* 105*  BUN 6  --  5* <5*  CREATININE 0.97  --  0.85 0.97  CALCIUM 8.4*  --  7.4* 8.3*  MG  --  1.6*  --   --    Liver Function Tests:  Recent Labs Lab 06/12/15 2235 06/13/15 0525  AST 16 17  ALT 14 15  ALKPHOS 80 69  BILITOT 2.5* 1.8*  PROT 7.7 6.3*  ALBUMIN 4.2 3.1*   No results for input(s): LIPASE, AMYLASE in the last 168 hours. No results for input(s): AMMONIA in the last 168 hours. CBC:  Recent Labs Lab 06/12/15 2235 06/13/15 0525 06/14/15 0449  WBC 15.4* 12.2* 9.4  NEUTROABS 13.0*  --  7.7  HGB 10.2* 9.1* 9.1*  HCT 33.5* 30.1* 30.4*  MCV 64.8* 65.3* 65.0*  PLT 326 273 322    Radiological Exams on Admission: Ct Abdomen Pelvis W Contrast  06/13/2015   CLINICAL  DATA:  Low back pain, onset Monday July 4th.  Fever.  EXAM: CT ABDOMEN AND PELVIS WITH CONTRAST  TECHNIQUE: Multidetector CT imaging of the abdomen and pelvis was performed using the standard protocol following bolus administration of intravenous contrast.  CONTRAST:  139m OMNIPAQUE IOHEXOL 300 MG/ML  SOLN  COMPARISON:  None.  FINDINGS: BODY WALL: No contributory findings.  LOWER CHEST: No contributory findings.  ABDOMEN/PELVIS:  Liver: No focal abnormality.  Biliary: No evidence of biliary obstruction or stone.  Pancreas: Unremarkable.  Spleen: Unremarkable.  Adrenals: Unremarkable.  Kidneys and ureters: There is bilateral urothelial thickening with right perinephric and periureteric edema. There is patchy hypo enhancement of the bilateral renal cortex, greater on the right and best seen on coronal reformats. No stone or hydronephrosis. No abscess.  Bladder: Circumferential thickening with perivesicular fat haziness.   Reproductive: No pathologic findings.  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 The duodenum does not cross the midline. No appendicitis.  Retroperitoneum: Enlarged ileocolic lymph nodes, likely from remote inflammation given the circumstances.  Peritoneum: No ascites or pneumoperitoneum.  Vascular: No acute abnormality. Reversed SMV and SMA positioning, as above.  OSSEOUS: No acute abnormalities.  IMPRESSION: 1. Cystitis and bilateral pyelonephritis. No hydronephrosis or abscess. 2. Malrotation/non-rotation of bowel.   Electronically Signed   By: JMonte FantasiaM.D.   On: 06/13/2015 03:12    EKG: Independently reviewed.    Assessment/Plan Present on Admission:  . Nausea and vomiting . Pyelonephritis, acute . Asthma . Irritable bowel syndrome . Hypokalemia . Normocytic anemia . Total bilirubin, elevated . Hyponatremia . Hypomagnesemia  PLAN:  Septic shock from pyelonephritis.  Resolving slowly.  Will give more IVF.  Check cultures.  Continue with IV Zosyn.  She has microcytic anemia, so will check Ferritin.  Electrolytes were repleted.  Will recheck tomorrow.  Keep in ICU today as well.   Other plans as per orders.  Code Status: FULL CODE>    LOrvan Falconer MD. [facility] Pager [phone] to 7am.  06/14/2015, 9:31 AM

## 2015-06-14 NOTE — Plan of Care (Signed)
Problem: Phase I Progression Outcomes Goal: Pain controlled with appropriate interventions Outcome: Progressing Continued with PRN pain maedications Goal: OOB as tolerated unless otherwise ordered Outcome: Completed/Met Date Met:  06/14/15 Steady Gait  Goal: Vital Signs stable- temperature less than 102 Outcome: Progressing Remains ST Goal: Initial discharge plan identified Outcome: Completed/Met Date Met:  06/14/15 Home Goal: Voiding-avoid urinary catheter unless indicated Outcome: Completed/Met Date Met:  06/14/15 Voiding without difficulty in [facility] Goal: Tolerating diet Outcome: Completed/Met Date Met:  06/14/15 Tolerating diet without difficulty Goal: Hemodynamically stable Outcome: Progressing Continues with intermediate fevers and elevated HR

## 2015-06-15 DIAGNOSIS — A419 Sepsis, unspecified organism: Principal | ICD-10-CM

## 2015-06-15 DIAGNOSIS — R6521 Severe sepsis with septic shock: Secondary | ICD-10-CM

## 2015-06-15 LAB — CBC
HCT: 25.9 % — ABNORMAL LOW (ref 36.0–46.0)
HCT: 30.5 % — ABNORMAL LOW (ref 36.0–46.0)
HEMOGLOBIN: 9.3 g/dL — AB (ref 12.0–15.0)
Hemoglobin: 7.8 g/dL — ABNORMAL LOW (ref 12.0–15.0)
MCH: 19.4 pg — ABNORMAL LOW (ref 26.0–34.0)
MCH: 19.6 pg — AB (ref 26.0–34.0)
MCHC: 30.1 g/dL (ref 30.0–36.0)
MCHC: 30.5 g/dL (ref 30.0–36.0)
MCV: 64.4 fL — AB (ref 78.0–100.0)
MCV: 64.2 fL — AB (ref 78.0–100.0)
PLATELETS: 295 10*3/uL (ref 150–400)
Platelets: 344 10*3/uL (ref 150–400)
RBC: 4.02 MIL/uL (ref 3.87–5.11)
RBC: 4.75 MIL/uL (ref 3.87–5.11)
RDW: 17.1 % — ABNORMAL HIGH (ref 11.5–15.5)
RDW: 17.2 % — AB (ref 11.5–15.5)
WBC: 6.4 10*3/uL (ref 4.0–10.5)
WBC: 6 10*3/uL (ref 4.0–10.5)

## 2015-06-15 LAB — COMPREHENSIVE METABOLIC PANEL
ALT: 48 U/L (ref 14–54)
AST: 55 U/L — ABNORMAL HIGH (ref 15–41)
Albumin: 2.7 g/dL — ABNORMAL LOW (ref 3.5–5.0)
Alkaline Phosphatase: 84 U/L (ref 38–126)
Anion gap: 6 (ref 5–15)
BUN: <5 mg/dL — ABNORMAL LOW (ref 6–20)
CO2: 19 mmol/L — ABNORMAL LOW (ref 22–32)
Calcium: 8 mg/dL — ABNORMAL LOW (ref 8.9–10.3)
Chloride: 113 mmol/L — ABNORMAL HIGH (ref 101–111)
Creatinine, Ser: 0.79 mg/dL (ref 0.44–1.00)
GFR calc non Af Amer: >60 mL/min (ref >60)
Glucose, Bld: 117 mg/dL — ABNORMAL HIGH (ref 65–99)
Potassium: 4.1 mmol/L (ref 3.5–5.1)
Sodium: 138 mmol/L (ref 135–145)
TOTAL PROTEIN: 5.9 g/dL — AB (ref 6.5–8.1)
Total Bilirubin: 0.8 mg/dL (ref 0.3–1.2)

## 2015-06-15 LAB — URINE CULTURE: Special Requests: NORMAL

## 2015-06-15 LAB — PREPARE RBC (CROSSMATCH)

## 2015-06-15 LAB — FERRITIN: Ferritin: 53 ng/mL (ref 11–307)

## 2015-06-15 LAB — ABO/RH: ABO/RH(D): O POS

## 2015-06-15 MED ORDER — POTASSIUM CHLORIDE IN NACL 20-0.9 MEQ/L-% IV SOLN
INTRAVENOUS | Status: DC
  Administered 2015-06-15 (×2): via INTRAVENOUS

## 2015-06-15 MED ORDER — SODIUM CHLORIDE 0.9 % IV SOLN: Freq: Once | INTRAVENOUS | Status: AC

## 2015-06-15 NOTE — Progress Notes (Signed)
Report called to C. Knight,RN. Patient transferred to [id number] in stable condition via wheelchair.

## 2015-06-15 NOTE — Progress Notes (Signed)
[facility] PROGRESS NOTE  Melissa Livingston WCB:[id number] DOB: 08/19/1996    PCP:   Sallee Lange, MD   HPI:   Melissa Livingston is an 19 y.o. female admitted yesterday for bilateral pyelonephritis, likely early sepsis, with hypokalemia, hypocalcemia, tachycardia and hypotension. She received over 3 L of IVF, and has normotensive at this time. Alert, orient, and is feeling better. She was given IV Rocephin, then maintained on IV Zosyn at this time.  Her Hb is now 7.8 g/dL, and she has been feeling weak and DOE for the past month.  Rewiew of Systems:  Constitutional: Negative for malaise, fever and chills. No significant weight loss or weight gain Eyes: Negative for eye pain, redness and discharge, diplopia, visual changes, or flashes of light. ENMT: Negative for ear pain, hoarseness, nasal congestion, sinus pressure and sore throat. No headaches; tinnitus, drooling, or problem swallowing. Cardiovascular: Negative for chest pain, palpitations, diaphoresis, dyspnea and peripheral edema. ; No orthopnea, PND Respiratory: Negative for cough, hemoptysis, wheezing and stridor. No pleuritic chestpain. Gastrointestinal: Negative for nausea, vomiting, diarrhea, constipation, abdominal pain, melena, blood in stool, hematemesis, jaundice and rectal bleeding.    Genitourinary: Negative for frequency, dysuria, incontinence,flank pain and hematuria; Musculoskeletal: Negative for back pain and neck pain. Negative for swelling and trauma.;  Skin: . Negative for pruritus, rash, abrasions, bruising and skin lesion.; ulcerations Neuro: Negative for headache, lightheadedness and neck stiffness.  altered level of consciousness , altered mental status, extremity weakness, burning feet, involuntary movement, seizure and syncope.  Psych: negative for anxiety, depression, insomnia, tearfulness, panic attacks, hallucinations, paranoia, suicidal or homicidal ideation   Past Medical History  Diagnosis Date  . Asthma   .  Pelvic pain in female 01/11/2015  . Nausea and vomiting 01/11/2015  . Endometritis 01/11/2015  . Ovarian cyst 01/14/2015  . History of ovarian cyst 01/28/2015  . GERD (gastroesophageal reflux disease)     Past Surgical History  Procedure Laterality Date  . None    . Esophagogastroduodenoscopy (egd) with esophageal dilation N/A 07/19/2013    Dr. Rourk:normal appearing esophagus s/p dilation, normal D1 and D2, unremarkable path   . Cesarean section N/A 09/29/2014    Procedure: CESAREAN SECTION;  Surgeon: Jonnie Kind, MD;  Location: [facility];  Service: Obstetrics;  Laterality: N/A;    Medications:  HOME MEDS: Prior to Admission medications   Medication Sig Start Date End Date Taking? Authorizing Provider  acetaminophen (TYLENOL) 500 MG tablet Take 1,000 mg by mouth every 6 (six) hours as needed.   Yes Historical Provider, MD  hyoscyamine (LEVSIN SL) 0.125 MG SL tablet Place 1 tablet (0.125 mg total) under the tongue every 4 (four) hours as needed. 05/08/15  Yes Orvil Feil, NP  Iron-FA-B Cmp-C-Biot-Probiotic (FUSION PLUS) CAPS Take 1 capsule by mouth See admin instructions. 1 time daily between meals 07/10/14  Yes Roma Schanz, CNM  medroxyPROGESTERone (DEPO-PROVERA) 150 MG/ML injection Inject 1 mL (150 mg total) into the muscle every 3 (three) months. 01/28/15  Yes Estill Dooms, NP  peg 3350 powder (MOVIPREP) 100 G SOLR Take 1 kit (200 g total) by mouth once. 05/16/15 06/15/15 Yes Orvil Feil, NP     Allergies:  Allergies  Allergen Reactions  . Adhesive [Tape] Other (See Comments)    blisters  . Lorabid [Loracarbef] Hives and Swelling  . Latex Itching and Rash  . Orange Fruit [Citrus] Rash    Social History:   reports that she has never smoked. She has never used smokeless  tobacco. She reports that she does not drink alcohol or use illicit drugs.  Family History: Family History  Problem Relation Age of Onset  . Multiple sclerosis Mother   . Colon cancer Other     maternal  great uncle, age greater than 71  . Heart disease Maternal Grandfather   . Heart disease Paternal Uncle   . Diabetes Paternal Uncle   . Hypertension Paternal Uncle   . Cancer Other     father's aunt had breast cancer     Physical Exam: Filed Vitals:   06/15/15 0400 06/15/15 0407 06/15/15 0802 06/15/15 0809  BP:  126/69 107/56   Pulse:  99 109   Temp: 99.2 F (37.3 C)   99.4 F (37.4 C)  TempSrc: Oral   Oral  Resp:  20 16   Height:      Weight: 56.246 kg (124 lb)     SpO2:  100% 92%    Blood pressure 107/56, pulse 109, temperature 99.4 F (37.4 C), temperature source Oral, resp. rate 16, height 5' 2"  (1.575 m), weight 56.246 kg (124 lb), SpO2 92 %, not currently breastfeeding.  GEN:  Pleasant  patient lying in the stretcher in no acute distress; cooperative with exam. PSYCH:  alert and oriented x4; does not appear anxious or depressed; affect is appropriate. HEENT: Mucous membranes pink and anicteric; PERRLA; EOM intact; no cervical lymphadenopathy nor thyromegaly or carotid bruit; no JVD; There were no stridor. Neck is very supple. Breasts:: Not examined CHEST WALL: No tenderness CHEST: Normal respiration, clear to auscultation bilaterally.  HEART: Regular rate and rhythm.  There are no murmur, rub, or gallops.   BACK: No kyphosis or scoliosis; no CVA tenderness ABDOMEN: soft and non-tender; no masses, no organomegaly, normal abdominal bowel sounds; no pannus; no intertriginous candida. There is no rebound and no distention. Rectal Exam: Not done EXTREMITIES: No bone or joint deformity; age-appropriate arthropathy of the hands and knees; no edema; no ulcerations.  There is no calf tenderness. Genitalia: not examined PULSES: 2+ and symmetric SKIN: Normal hydration no rash or ulceration CNS: Cranial nerves 2-12 grossly intact no focal lateralizing neurologic deficit.  Speech is fluent; uvula elevated with phonation, facial symmetry and tongue midline. DTR are normal  bilaterally, cerebella exam is intact, barbinski is negative and strengths are equaled bilaterally.  No sensory loss.   Labs on Admission:  Basic Metabolic Panel:  Recent Labs Lab 06/12/15 2235 06/13/15 0303 06/13/15 0525 06/14/15 0449 06/15/15 0425  NA 134*  --  137 137 138  K 3.2*  --  3.3* 4.2 4.1  CL 103  --  108 108 113*  CO2 21*  --  22 20* 19*  GLUCOSE 107*  --  111* 105* 117*  BUN 6  --  5* <5* <5*  CREATININE 0.97  --  0.85 0.97 0.79  CALCIUM 8.4*  --  7.4* 8.3* 8.0*  MG  --  1.6*  --   --   --    Liver Function Tests:  Recent Labs Lab 06/12/15 2235 06/13/15 0525 06/15/15 0425  AST 16 17 55*  ALT 14 15 48  ALKPHOS 80 69 84  BILITOT 2.5* 1.8* 0.8  PROT 7.7 6.3* 5.9*  ALBUMIN 4.2 3.1* 2.7*   CBC:  Recent Labs Lab 06/12/15 2235 06/13/15 0525 06/14/15 0449 06/15/15 0425  WBC 15.4* 12.2* 9.4 6.4  NEUTROABS 13.0*  --  7.7  --   HGB 10.2* 9.1* 9.1* 7.8*  HCT 33.5* 30.1* 30.4* 25.9*  MCV 64.8* 65.3* 65.0* 64.4*  PLT 326 273 322 295    EKG: Independently reviewed.    Assessment/Plan Present on Admission:  . Nausea and vomiting . Pyelonephritis, acute . Asthma . Irritable bowel syndrome . Hypokalemia . Normocytic anemia . Total bilirubin, elevated . Hyponatremia . Hypomagnesemia  PLAN: Septic shock:  Stable now. Will transfer to the floor today. Pyelonephritis with urine culture E Coli, pan sensitive. Continue with Zosyn.  Will plan d/c home on Cipro tomorrow. Tachycardia:  Volume depletion, and anemia.  Will transfuse 2 units of PRBCs. Anemia:  Likely iron deficient anemia.  Discussed risks and benefits, and she would like to receive transfusion.  Will give 2 units PRBCs. Electrolytes abnormality:  Repleted.   Other plans as per orders.  Code Status: FULL Haskel Khan, MD. [facility] Pager [phone] 7pm to 7am.  06/15/2015, 10:29 AM

## 2015-06-16 LAB — CBC
HEMATOCRIT: 28.6 % — AB (ref 36.0–46.0)
HEMOGLOBIN: 8.7 g/dL — AB (ref 12.0–15.0)
MCH: 19.3 pg — AB (ref 26.0–34.0)
MCHC: 30.4 g/dL (ref 30.0–36.0)
MCV: 63.4 fL — ABNORMAL LOW (ref 78.0–100.0)
Platelets: 388 10*3/uL (ref 150–400)
RBC: 4.51 MIL/uL (ref 3.87–5.11)
RDW: 17.5 % — ABNORMAL HIGH (ref 11.5–15.5)
WBC: 5.7 10*3/uL (ref 4.0–10.5)

## 2015-06-16 MED ORDER — CIPROFLOXACIN HCL 500 MG PO TABS: 500.0000 mg | ORAL_TABLET | Freq: Two times a day (BID) | ORAL | Status: DC

## 2015-06-16 NOTE — Discharge Summary (Signed)
Physician Discharge Summary  Melissa Livingston:[id number] DOB: Sep 28, 1996 DOA: 06/12/2015  PCP: Sallee Lange, MD  Admit date: 06/12/2015 Discharge date: 06/16/2015  Time spent: 35 minutes  Recommendations for Outpatient Follow-up:  1. Follow up with PCP in 1 week.   Discharge Diagnoses:  Principal Problem:   Pyelonephritis, acute Active Problems:   Asthma   Irritable bowel syndrome   Nausea and vomiting   Congenital malrotation of intestine   Hypokalemia   Normocytic anemia   Total bilirubin, elevated   Hyponatremia   Hypomagnesemia   Nausea with vomiting   Discharge Condition: Markedly improved, no abd pain, no back pain.  Diet recommendation: cardiac diet.   Filed Weights   06/14/15 0500 06/15/15 0400 06/16/15 0529  Weight: 57.1 kg (125 lb 14.1 oz) 56.246 kg (124 lb) 73.211 kg (161 lb 6.4 oz)    History of present illness: patient was admitted into the ICU in septic shock by Dr Shanon Brow on June 13, 2015.  As per her H and P: " 19 yo female comes in with fevers, chills, dysuria, increased in urinary frequency since Saturday (4 days ago). She has progressively gotten worse and started vomiting today. Started with bilateral flank pain on Monday which has also worsened. Her pain was 10/10 pain, was given toradol which helped to 8/10, given fentanyl also which helped to 7/10. Still in significant amount of pain, but she doesn't really complain a lot or look like she is in a lot of pain. She has remained tachycardic in the 130s despite 3 liters of ivf in the ED. Ct shows bilateral pyelonephritis. No recent abx. She is 8 months postpartum. Did not have any kidney /bladder issues with her pregnancy, or ever in the past.  Allergy to lorabid as a child which is a second generation cephalosporin was hives/swelling. Per epic chart has had ancef before. Also azithromycin, flagyl, doxycycline and amoxicillin taken before with no difficulty. Has never had rocephin or keflex before. Not on  levaquin before but recalls taking cipro in past.    Hospital Course:  Patient was admitted into the ICU, and all parameters were indicative of septic shock.  She was given significant IVF, and started on Zosyn.  Over several days, her BP was borderline, and she was tachycardic.  Her TSH was normal.  She also had anemia, but did not require transfusion.  Her Ferritin was on the low side, at 53.  She spiked fever daily for 3 days, then defervest.  She was subsequently transferred to the floor, and started ambulating.  Her abdominal pelvic CT confirmed bilateral pyelonephritis, no abscess, and no hydronephritits.  Her urine culture grew E coli pan sensitive.  She felt well, anxious to go home, and is stable for discharge.  She will be given another 5 days of Cipro, and will follow up with her PCP.  I will restart her on her iron as well.  Dr Wolfgang Phoenix, thank you so much for allowing me to participate in her care.      Consultations:  None.   Discharge Exam: Filed Vitals:   06/16/15 0529  BP:   Pulse: 72  Temp: 98 F (36.7 C)  Resp: 18   Discharge Instructions   Discharge Instructions    Diet - low sodium heart healthy    Complete by:  As directed      Increase activity slowly    Complete by:  As directed           Current Discharge Medication  List    START taking these medications   Details  ciprofloxacin (CIPRO) 500 MG tablet Take 1 tablet (500 mg total) by mouth 2 (two) times daily. Qty: 10 tablet, Refills: 0      CONTINUE these medications which have NOT CHANGED   Details  acetaminophen (TYLENOL) 500 MG tablet Take 1,000 mg by mouth every 6 (six) hours as needed.    hyoscyamine (LEVSIN SL) 0.125 MG SL tablet Place 1 tablet (0.125 mg total) under the tongue every 4 (four) hours as needed. Qty: 30 tablet, Refills: 0    Iron-FA-B Cmp-C-Biot-Probiotic (FUSION PLUS) CAPS Take 1 capsule by mouth See admin instructions. 1 time daily between meals Qty: 30 capsule, Refills: 6     medroxyPROGESTERone (DEPO-PROVERA) 150 MG/ML injection Inject 1 mL (150 mg total) into the muscle every 3 (three) months. Qty: 1 mL, Refills: 4      STOP taking these medications     peg 3350 powder (MOVIPREP) 100 G SOLR        Allergies  Allergen Reactions  . Adhesive [Tape] Other (See Comments)    blisters  . Lorabid [Loracarbef] Hives and Swelling  . Latex Itching and Rash  . Orange Fruit [Citrus] Rash      The results of significant diagnostics from this hospitalization (including imaging, microbiology, ancillary and laboratory) are listed below for reference.    Significant Diagnostic Studies: Ct Abdomen Pelvis W Contrast  06/13/2015   CLINICAL DATA:  Low back pain, onset Monday July 4th.  Fever.  EXAM: CT ABDOMEN AND PELVIS WITH CONTRAST  TECHNIQUE: Multidetector CT imaging of the abdomen and pelvis was performed using the standard protocol following bolus administration of intravenous contrast.  CONTRAST:  160mL OMNIPAQUE IOHEXOL 300 MG/ML  SOLN  COMPARISON:  None.  FINDINGS: BODY WALL: No contributory findings.  LOWER CHEST: No contributory findings.  ABDOMEN/PELVIS:  Liver: No focal abnormality.  Biliary: No evidence of biliary obstruction or stone.  Pancreas: Unremarkable.  Spleen: Unremarkable.  Adrenals: Unremarkable.  Kidneys and ureters: There is bilateral urothelial thickening with right perinephric and periureteric edema. There is patchy hypo enhancement of the bilateral renal cortex, greater on the right and best seen on coronal reformats. No stone or hydronephrosis. No abscess.  Bladder: Circumferential thickening with perivesicular fat haziness.  Reproductive: No pathologic findings.  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 The duodenum does not cross the midline. No appendicitis.  Retroperitoneum: Enlarged ileocolic lymph nodes, likely from remote inflammation given the circumstances.  Peritoneum: No ascites or  pneumoperitoneum.  Vascular: No acute abnormality. Reversed SMV and SMA positioning, as above.  OSSEOUS: No acute abnormalities.  IMPRESSION: 1. Cystitis and bilateral pyelonephritis. No hydronephrosis or abscess. 2. Malrotation/non-rotation of bowel.   Electronically Signed   By: Monte Fantasia M.D.   On: 06/13/2015 03:12    Microbiology: Recent Results (from the past 240 hour(s))  Urine culture     Status: None   Collection Time: 06/12/15 10:30 PM  Result Value Ref Range Status   Specimen Description URINE, CLEAN CATCH  Final   Special Requests Normal  Final   Culture   Final    >=100,000 COLONIES/mL ESCHERICHIA COLI Performed at [facility]    Report Status 06/15/2015 FINAL  Final   Organism ID, Bacteria ESCHERICHIA COLI  Final      Susceptibility   Escherichia coli - MIC*    AMPICILLIN <=2 SENSITIVE Sensitive  CEFAZOLIN <=4 SENSITIVE Sensitive     CEFTRIAXONE <=1 SENSITIVE Sensitive     CIPROFLOXACIN <=0.25 SENSITIVE Sensitive     GENTAMICIN <=1 SENSITIVE Sensitive     IMIPENEM <=0.25 SENSITIVE Sensitive     NITROFURANTOIN <=16 SENSITIVE Sensitive     TRIMETH/SULFA <=20 SENSITIVE Sensitive     AMPICILLIN/SULBACTAM <=2 SENSITIVE Sensitive     PIP/TAZO <=4 SENSITIVE Sensitive     * >=100,000 COLONIES/mL ESCHERICHIA COLI  Culture, blood (routine x 2)     Status: None (Preliminary result)   Collection Time: 06/13/15  3:06 AM  Result Value Ref Range Status   Specimen Description BLOOD LEFT ANTECUBITAL  Final   Special Requests   Final    BOTTLES DRAWN AEROBIC AND ANAEROBIC AEB 8CC ANA [place]   Culture NO GROWTH 2 DAYS  Final   Report Status PENDING  Incomplete  Culture, blood (routine x 2)     Status: None (Preliminary result)   Collection Time: 06/13/15  3:34 AM  Result Value Ref Range Status   Specimen Description BLOOD LEFT ANTECUBITAL  Final   Special Requests BOTTLES DRAWN AEROBIC AND ANAEROBIC 10CC EACH  Final   Culture NO GROWTH 2 DAYS  Final   Report Status  PENDING  Incomplete  MRSA PCR Screening     Status: None   Collection Time: 06/13/15  4:58 AM  Result Value Ref Range Status   MRSA by PCR NEGATIVE NEGATIVE Final    Comment:        The GeneXpert MRSA Assay (FDA approved for NASAL specimens only), is one component of a comprehensive MRSA colonization surveillance program. It is not intended to diagnose MRSA infection nor to guide or monitor treatment for MRSA infections.   Culture, blood (routine x 2)     Status: None (Preliminary result)   Collection Time: 06/14/15  4:49 AM  Result Value Ref Range Status   Specimen Description BLOOD  Final   Special Requests NONE  Final   Culture NO GROWTH 1 DAY  Final   Report Status PENDING  Incomplete  Culture, blood (routine x 2)     Status: None (Preliminary result)   Collection Time: 06/14/15  5:05 AM  Result Value Ref Range Status   Specimen Description BLOOD  Final   Special Requests NONE  Final   Culture NO GROWTH 1 DAY  Final   Report Status PENDING  Incomplete     Labs: Basic Metabolic Panel:  Recent Labs Lab 06/12/15 2235 06/13/15 0303 06/13/15 0525 06/14/15 0449 06/15/15 0425  NA 134*  --  137 137 138  K 3.2*  --  3.3* 4.2 4.1  CL 103  --  108 108 113*  CO2 21*  --  22 20* 19*  GLUCOSE 107*  --  111* 105* 117*  BUN 6  --  5* <5* <5*  CREATININE 0.97  --  0.85 0.97 0.79  CALCIUM 8.4*  --  7.4* 8.3* 8.0*  MG  --  1.6*  --   --   --    Liver Function Tests:  Recent Labs Lab 06/12/15 2235 06/13/15 0525 06/15/15 0425  AST 16 17 55*  ALT 14 15 48  ALKPHOS 80 69 84  BILITOT 2.5* 1.8* 0.8  PROT 7.7 6.3* 5.9*  ALBUMIN 4.2 3.1* 2.7*   No results for input(s): LIPASE, AMYLASE in the last 168 hours. No results for input(s): AMMONIA in the last 168 hours. CBC:  Recent Labs Lab 06/12/15 2235 06/13/15 0525 06/14/15  9597 06/15/15 0425 06/15/15 1117 06/16/15 0552  WBC 15.4* 12.2* 9.4 6.4 6.0 5.7  NEUTROABS 13.0*  --  7.7  --   --   --   HGB 10.2* 9.1* 9.1*  7.8* 9.3* 8.7*  HCT 33.5* 30.1* 30.4* 25.9* 30.5* 28.6*  MCV 64.8* 65.3* 65.0* 64.4* 64.2* 63.4*  PLT 326 273 322 295 344 388   Signed:  Denyse Fillion  [facility] 06/16/2015, 10:56 AM

## 2015-06-16 NOTE — Progress Notes (Signed)
Removed pt IV and telemetry, pt tolerated well.  Reviewed discharge instructions with pt and answered all questions at this time.

## 2015-06-17 ENCOUNTER — Other Ambulatory Visit: Payer: Self-pay | Admitting: *Deleted

## 2015-06-17 ENCOUNTER — Telehealth: Payer: Self-pay | Admitting: Family Medicine

## 2015-06-17 MED ORDER — PROMETHAZINE HCL 25 MG PO TABS: ORAL_TABLET | ORAL | Status: DC

## 2015-06-17 NOTE — Telephone Encounter (Signed)
She may have to use an enema to help out. This might have to be done for 1-3 days depending on the results. Also MiraLAX powder one capful in 8 ounces of water once or twice a day for the next several days to keep the bowel movements soft if ongoing troubles let us know

## 2015-06-17 NOTE — Telephone Encounter (Signed)
Discussed with pt. Pt verbalized understanding.  °

## 2015-06-17 NOTE — Telephone Encounter (Signed)
Mother states pt just told her that she is constipated from taking pain meds in hospital. Last bm 3 days ago and it was hard. She is having some abd pain. No fever.

## 2015-06-17 NOTE — Telephone Encounter (Signed)
Seen yesterday er for pyelonephritis

## 2015-06-17 NOTE — Telephone Encounter (Signed)
Patient was recently seen in the hospital for a kidney infection.  Mom says that she is having a lot of nausea and would like phenergan called in.   Melissa Livingston

## 2015-06-17 NOTE — Telephone Encounter (Signed)
Phenergan 25 mg one bid prn nausea, #12, caution drowsiness, if fevers or worse then NTBS

## 2015-06-17 NOTE — Telephone Encounter (Signed)
Discussed with mother. Med sent to pharm.

## 2015-06-18 LAB — TYPE AND SCREEN
ABO/RH(D): O POS
Antibody Screen: NEGATIVE
UNIT DIVISION: 0
Unit division: 0

## 2015-06-18 LAB — CULTURE, BLOOD (ROUTINE X 2)
CULTURE: NO GROWTH
Culture: NO GROWTH

## 2015-06-19 LAB — CULTURE, BLOOD (ROUTINE X 2)
Culture: NO GROWTH
Culture: NO GROWTH

## 2015-07-01 ENCOUNTER — Encounter: Payer: Self-pay | Admitting: Family Medicine

## 2015-07-01 ENCOUNTER — Ambulatory Visit (INDEPENDENT_AMBULATORY_CARE_PROVIDER_SITE_OTHER): Payer: Commercial Managed Care - HMO | Admitting: Family Medicine

## 2015-07-01 DIAGNOSIS — R5383 Other fatigue: Secondary | ICD-10-CM

## 2015-07-01 DIAGNOSIS — R109 Unspecified abdominal pain: Secondary | ICD-10-CM | POA: Diagnosis not present

## 2015-07-01 DIAGNOSIS — D509 Iron deficiency anemia, unspecified: Secondary | ICD-10-CM

## 2015-07-01 DIAGNOSIS — N3000 Acute cystitis without hematuria: Secondary | ICD-10-CM | POA: Diagnosis not present

## 2015-07-01 LAB — POCT URINALYSIS DIPSTICK: pH, UA: 6

## 2015-07-01 MED ORDER — SULFAMETHOXAZOLE-TRIMETHOPRIM 800-160 MG PO TABS: 1.0000 | ORAL_TABLET | Freq: Two times a day (BID) | ORAL | Status: DC

## 2015-07-01 NOTE — Progress Notes (Signed)
   Subjective:    Patient ID: [name], female    DOB: [birth year], 19 y.o.   MRN: [id number]  HPIFollow up from hospitalization. Pt admitted to hospital for pyelonephritis and hypokalemia.  Pt still having abd pain. Finished cipro.   Cough, headache, sob, and fatigue started about 3 days ago.  I reviewed over her ER records hospital records and lab work. Patient needs to have repeat of testing completed. Increased heart rate. Pulse today 104.  Greater than 25 minutes was spent going over the multiple test results discussing the fatigue with the patient plus also her abnormal lab results while in the hospital Review of Systems    patient with some fatigue and some shortness of breath when she moves around feels rundown denies high fever relates some urinary frequency denies vomiting or diarrhea denies any bleeding Objective:   Physical Exam Lungs clear hearts regular abdomen soft no guarding or rebound flanks nontender patient states she feels bad.       Assessment & Plan:  1. Abdominal pain, unspecified abdominal location The patient's abdominal symptoms are actually doing better but we will need to check the patient again in a couple weeks time to repeat her urine I believe she does have a mild UTI rule go ahead and treat with antibiotics - POCT urinalysis dipstick - PTH, Intact and Calcium - Comprehensive metabolic panel - Iron Binding Cap (TIBC) - TSH - T4, free - Urine culture - Reticulocytes - CBC with Differential/Platelet  2. Acute cystitis without hematuria UTI treated with antibiotics - PTH, Intact and Calcium - Comprehensive metabolic panel - Iron Binding Cap (TIBC) - TSH - T4, free - Urine culture - Reticulocytes - CBC with Differential/Platelet  3. Hypocalcemia Patient with hypocalcemia while in the hospital we will check PT H as well as calcium - PTH, Intact and Calcium - Comprehensive metabolic panel - Iron Binding Cap (TIBC) - TSH - T4, free - Urine  culture - Reticulocytes - CBC with Differential/Platelet  4. Anemia, iron deficiency Patient with iron deficient anemia I believe the patient should go ahead with her colonoscopy she would be perfectly fine to do so in August we will notify the gastroenterologist apparently patient had an appointment earlier than canceled because of the sickness - PTH, Intact and Calcium - Comprehensive metabolic panel - Iron Binding Cap (TIBC) - TSH - T4, free - Urine culture - Reticulocytes - CBC with Differential/Platelet  5. Other fatigue The fatigue is related to the anemia but we will be checking other lab work - PTH, Intact and Calcium - Comprehensive metabolic panel - Iron Binding Cap (TIBC) - TSH - T4, free - Urine culture - Reticulocytes - CBC with Differential/Platelet  Patient to follow-up in several weeks

## 2015-07-03 LAB — CBC WITH DIFFERENTIAL/PLATELET
BASOS: 0 %
Basophils Absolute: 0 10*3/uL (ref 0.0–0.2)
EOS (ABSOLUTE): 0.1 10*3/uL (ref 0.0–0.4)
Eos: 2 %
Hematocrit: 33.2 % — ABNORMAL LOW (ref 34.0–46.6)
Hemoglobin: 10.1 g/dL — ABNORMAL LOW (ref 11.1–15.9)
IMMATURE GRANULOCYTES: 0 %
Immature Grans (Abs): 0 10*3/uL (ref 0.0–0.1)
LYMPHS ABS: 2 10*3/uL (ref 0.7–3.1)
LYMPHS: 35 %
MCH: 19.7 pg — ABNORMAL LOW (ref 26.6–33.0)
MCHC: 30.4 g/dL — ABNORMAL LOW (ref 31.5–35.7)
MCV: 65 fL — ABNORMAL LOW (ref 79–97)
MONOS ABS: 0.5 10*3/uL (ref 0.1–0.9)
Monocytes: 8 %
NEUTROS ABS: 3.2 10*3/uL (ref 1.4–7.0)
Neutrophils: 55 %
PLATELETS: 611 10*3/uL — AB (ref 150–379)
RBC: 5.12 x10E6/uL (ref 3.77–5.28)
RDW: 18.4 % — AB (ref 12.3–15.4)
WBC: 5.8 10*3/uL (ref 3.4–10.8)

## 2015-07-03 LAB — COMPREHENSIVE METABOLIC PANEL
ALT: 10 IU/L (ref 0–32)
AST: 11 IU/L (ref 0–40)
Albumin/Globulin Ratio: 1.4 (ref 1.1–2.5)
Albumin: 4.3 g/dL (ref 3.5–5.5)
Alkaline Phosphatase: 104 IU/L (ref 39–117)
BUN/Creatinine Ratio: 7 — ABNORMAL LOW (ref 8–20)
BUN: 6 mg/dL (ref 6–20)
Bilirubin Total: 0.6 mg/dL (ref 0.0–1.2)
CHLORIDE: 100 mmol/L (ref 97–108)
CO2: 20 mmol/L (ref 18–29)
Calcium: 9.4 mg/dL (ref 8.7–10.2)
Creatinine, Ser: 0.81 mg/dL (ref 0.57–1.00)
GFR calc Af Amer: 120 mL/min/{1.73_m2} (ref >59)
GFR calc non Af Amer: 104 mL/min/{1.73_m2} (ref >59)
GLOBULIN, TOTAL: 3.1 g/dL (ref 1.5–4.5)
Glucose: 91 mg/dL (ref 65–99)
Potassium: 4.3 mmol/L (ref 3.5–5.2)
SODIUM: 139 mmol/L (ref 134–144)
Total Protein: 7.4 g/dL (ref 6.0–8.5)

## 2015-07-03 LAB — URINE CULTURE

## 2015-07-03 LAB — IRON AND TIBC
IRON SATURATION: 6 % — AB (ref 15–55)
Iron: 23 ug/dL — ABNORMAL LOW (ref 27–159)
Total Iron Binding Capacity: 384 ug/dL (ref 250–450)
UIBC: 361 ug/dL (ref 131–425)

## 2015-07-03 LAB — PTH, INTACT AND CALCIUM: PTH: 30 pg/mL (ref 15–65)

## 2015-07-03 LAB — T4, FREE: FREE T4: 1.25 ng/dL (ref 0.82–1.77)

## 2015-07-03 LAB — TSH: TSH: 0.962 u[IU]/mL (ref 0.450–4.500)

## 2015-07-03 LAB — RETICULOCYTES: Retic Ct Pct: 1.1 % (ref 0.6–2.6)

## 2015-07-08 ENCOUNTER — Telehealth: Payer: Self-pay | Admitting: Family Medicine

## 2015-07-08 DIAGNOSIS — D509 Iron deficiency anemia, unspecified: Secondary | ICD-10-CM

## 2015-07-08 NOTE — Addendum Note (Signed)
Addended by: Dairl Ponder on: 07/08/2015 11:32 AM   Modules accepted: Orders

## 2015-07-08 NOTE — Telephone Encounter (Signed)
Pt would like to go ahead with the iron infusion you had advised her to get last week  Please set this up as soon as possible

## 2015-07-08 NOTE — Telephone Encounter (Signed)
Referral placed in Epic. Patient notified.

## 2015-07-08 NOTE — Telephone Encounter (Signed)
Refer to hematology Dr Whitney Muse, (let pt know this is a process regarding referrals) rea: iron def anemia

## 2015-07-18 ENCOUNTER — Ambulatory Visit (INDEPENDENT_AMBULATORY_CARE_PROVIDER_SITE_OTHER): Payer: Commercial Managed Care - HMO | Admitting: *Deleted

## 2015-07-18 ENCOUNTER — Encounter: Payer: Self-pay | Admitting: *Deleted

## 2015-07-18 DIAGNOSIS — Z3202 Encounter for pregnancy test, result negative: Secondary | ICD-10-CM | POA: Diagnosis not present

## 2015-07-18 DIAGNOSIS — Z3042 Encounter for surveillance of injectable contraceptive: Secondary | ICD-10-CM

## 2015-07-18 LAB — POCT URINE PREGNANCY: Preg Test, Ur: NEGATIVE

## 2015-07-18 MED ORDER — MEDROXYPROGESTERONE ACETATE 150 MG/ML IM SUSP
150.0000 mg | Freq: Once | INTRAMUSCULAR | Status: AC
  Administered 2015-07-18: 150 mg via INTRAMUSCULAR

## 2015-07-18 NOTE — Progress Notes (Signed)
Pt here for Depo. Reports no problems at this time. Return in 12 weeks for next shot. JSY 

## 2015-07-19 ENCOUNTER — Ambulatory Visit (INDEPENDENT_AMBULATORY_CARE_PROVIDER_SITE_OTHER): Payer: Commercial Managed Care - HMO | Admitting: Family Medicine

## 2015-07-19 ENCOUNTER — Encounter: Payer: Self-pay | Admitting: Family Medicine

## 2015-07-19 VITALS — BP 112/78 | Ht 62.0 in | Wt 168.4 lb

## 2015-07-19 DIAGNOSIS — R3589 Other polyuria: Secondary | ICD-10-CM

## 2015-07-19 DIAGNOSIS — R358 Other polyuria: Secondary | ICD-10-CM

## 2015-07-19 LAB — POCT URINALYSIS DIPSTICK
SPEC GRAV UA: 1.02
pH, UA: 6

## 2015-07-19 NOTE — Progress Notes (Signed)
   Subjective:    Patient ID: [name], female    DOB: Jul 05, 1996, 19 y.o.   MRN: [id number]  HPI Patient arrives for a follow up on fatigue and abd pain. Patient has appt with hematology 07/24/15 to set up iron infusion for iron deficiency anemia.  I did show the patient older lab work from [facility] we went over each one greater than half of her time spent today was in discussion and answering questions. Patient with slight urinary frequency but denies true dysuria denies fever chills  Review of Systems  Constitutional: Negative for fatigue.  HENT: Negative for congestion.   Respiratory: Negative for cough.   Gastrointestinal: Negative for vomiting, diarrhea and constipation.  Genitourinary: Positive for frequency.       Objective:   Physical Exam  Constitutional: She appears well-nourished. No distress.  Cardiovascular: Normal rate, regular rhythm and normal heart sounds.   No murmur heard. Pulmonary/Chest: Effort normal and breath sounds normal. No respiratory distress.  Musculoskeletal: She exhibits no edema.  Lymphadenopathy:    She has no cervical adenopathy.  Neurological: She is alert. She exhibits normal muscle tone.  Psychiatric: Her behavior is normal.  Vitals reviewed.     UA overall look good     Assessment & Plan:   patient with iron deficient anemia. Testing ordered on previous visit she will get iron infusion coming up    UTI resolved urinalysis negative  Patient to follow-up when necessary

## 2015-07-23 ENCOUNTER — Ambulatory Visit: Payer: Commercial Managed Care - HMO | Admitting: Family Medicine

## 2015-07-23 ENCOUNTER — Encounter (HOSPITAL_COMMUNITY): Payer: Commercial Managed Care - HMO | Attending: Hematology & Oncology | Admitting: Hematology & Oncology

## 2015-07-23 VITALS — BP 111/74 | HR 104 | Temp 98.3°F | Resp 16 | Ht 62.0 in | Wt 167.1 lb

## 2015-07-23 DIAGNOSIS — D509 Iron deficiency anemia, unspecified: Secondary | ICD-10-CM

## 2015-07-23 LAB — CBC WITH DIFFERENTIAL/PLATELET
Basophils Absolute: 0 10*3/uL (ref 0.0–0.1)
Basophils Relative: 0 % (ref 0–1)
EOS PCT: 1 % (ref 0–5)
Eosinophils Absolute: 0.1 10*3/uL (ref 0.0–0.7)
HEMATOCRIT: 33.7 % — AB (ref 36.0–46.0)
HEMOGLOBIN: 10.2 g/dL — AB (ref 12.0–15.0)
LYMPHS ABS: 1.9 10*3/uL (ref 0.7–4.0)
LYMPHS PCT: 26 % (ref 12–46)
MCH: 20.2 pg — ABNORMAL LOW (ref 26.0–34.0)
MCHC: 30.3 g/dL (ref 30.0–36.0)
MCV: 66.6 fL — AB (ref 78.0–100.0)
MONO ABS: 0.6 10*3/uL (ref 0.1–1.0)
Monocytes Relative: 8 % (ref 3–12)
NEUTROS ABS: 4.6 10*3/uL (ref 1.7–7.7)
Neutrophils Relative %: 65 % (ref 43–77)
Platelets: 408 10*3/uL — ABNORMAL HIGH (ref 150–400)
RBC: 5.06 MIL/uL (ref 3.87–5.11)
RDW: 19.3 % — AB (ref 11.5–15.5)
WBC: 7.1 10*3/uL (ref 4.0–10.5)

## 2015-07-23 LAB — FERRITIN: Ferritin: 3 ng/mL — ABNORMAL LOW (ref 11–307)

## 2015-07-23 NOTE — Progress Notes (Signed)
[facility] [place] NOTE  Patient Care Team: Kathyrn Drown, MD as PCP - General (Family Medicine) Daneil Dolin, MD as Consulting Physician (Gastroenterology)  CHIEF COMPLAINTS/PURPOSE OF CONSULTATION: Iron deficiency anemia   Hemoglobin of 7.8 with MCV of 64.4 on 06/15/2015 Iron studies on 07/02/2015 with an iron saturation of 6%, serum iron of 23 UG/DL  HISTORY OF PRESENTING ILLNESS:  Melissa Livingston 19 y.o. female is here because of  iron deficiency anemia.  The patient is here today with her mother.  She's feeling tired due to her anemia.  She does not have heavy menstrual cycles. She is on Depo-Provera. She states that she eats red meat; not a vegetarian or anything like that. She's been on iron pills before when she was pregnant, and did well with them. He does admit that she did not remember to take them daily. She was iron deficient and anemic while pregnant. She eats ice a lot due to her anemia. She says she goes to the fridge to eat ice, and used to get it at [employer] as well. She found out she was anemic her senior year of high school when she went to give blood, but her anemia didn't become an issue until recently, after her C-section when she lost a lot of blood. She did not breast feed. She took prescription iron throughout the pregnancy, but stayed anemic.  She says she likes to swim, and wants to be able to take care of her 30 month old baby. She notes that her fatigue is very limiting.  MEDICAL HISTORY:  Past Medical History  Diagnosis Date  . Asthma   . Pelvic pain in female 01/11/2015  . Nausea and vomiting 01/11/2015  . Endometritis 01/11/2015  . Ovarian cyst 01/14/2015  . History of ovarian cyst 01/28/2015  . GERD (gastroesophageal reflux disease)     SURGICAL HISTORY: Past Surgical History  Procedure Laterality Date  . None    . Esophagogastroduodenoscopy (egd) with esophageal dilation N/A 07/19/2013    Dr. Rourk:normal appearing esophagus  s/p dilation, normal D1 and D2, unremarkable path   . Cesarean section N/A 09/29/2014    Procedure: CESAREAN SECTION;  Surgeon: Jonnie Kind, MD;  Location: [facility];  Service: Obstetrics;  Laterality: N/A;    SOCIAL HISTORY: Social History   Social History  . Marital Status: Single    Spouse Name: N/A  . Number of Children: 0  . Years of Education: N/A   Occupational History  . out due to knee    Social History Main Topics  . Smoking status: Never Smoker   . Smokeless tobacco: Never Used  . Alcohol Use: No  . Drug Use: No  . Sexual Activity: Yes    Birth Control/ Protection: Injection   Other Topics Concern  . Not on file   Social History Narrative   Going back to school; [employer] in [place] Non smoker, no alcohol problems No major medical problems She enjoys swimming  FAMILY HISTORY: Family History  Problem Relation Age of Onset  . Multiple sclerosis Mother   . Colon cancer Other     maternal great uncle, age greater than 64  . Heart disease Maternal Grandfather   . Heart disease Paternal Uncle   . Diabetes Paternal Uncle   . Hypertension Paternal Uncle   . Cancer Other     father's aunt had breast cancer   indicated that her mother is alive. She indicated that her  father is alive. She indicated that her sister is alive. She indicated that her maternal grandmother is alive. She indicated that her maternal grandfather is deceased. She indicated that her paternal grandmother is deceased. She indicated that her paternal grandfather is deceased. She indicated that her paternal uncle is alive. She indicated that her other is alive.  //  Mother is healthy other than her MS Father has high blood pressure Sister is also anemic; otherwise healthy No known sickle-cell or thallassemia (sp) She has a 18 old daughter   ALLERGIES:  is allergic to adhesive; lorabid; latex; and orange fruit.  MEDICATIONS:  Current Outpatient Prescriptions  Medication  Sig Dispense Refill  . ibuprofen (ADVIL,MOTRIN) 200 MG tablet Take 800 mg by mouth every 6 (six) hours as needed.    . Iron-FA-B Cmp-C-Biot-Probiotic (FUSION PLUS) CAPS Take 1 capsule by mouth See admin instructions. 1 time daily between meals 30 capsule 6  . medroxyPROGESTERone (DEPO-PROVERA) 150 MG/ML injection Inject 1 mL (150 mg total) into the muscle every 3 (three) months. 1 mL 4  . Multiple Vitamin (MULTIVITAMIN) tablet Take 1 tablet by mouth daily.    . promethazine (PHENERGAN) 25 MG tablet Take one bid prn nausea. Caution drowiness 12 tablet 0   No current facility-administered medications for this visit.    Review of Systems  Constitutional: Positive for malaise/fatigue. Negative for fever, chills and weight loss.       Fatigue due to anemia  HENT: Negative for congestion, hearing loss, nosebleeds, sore throat and tinnitus.   Eyes: Negative for blurred vision, double vision, pain and discharge.  Respiratory: Negative for cough, hemoptysis, sputum production, shortness of breath and wheezing.   Cardiovascular: Negative for chest pain, palpitations, claudication, leg swelling and PND.  Gastrointestinal: Negative for heartburn, nausea, vomiting, abdominal pain, diarrhea, constipation, blood in stool and melena.  Genitourinary: Negative for dysuria, urgency, frequency and hematuria.  Musculoskeletal: Negative for myalgias, joint pain and falls.  Skin: Negative for itching and rash.  Neurological: Positive for weakness. Negative for dizziness, tingling, tremors, sensory change, speech change, focal weakness, seizures, loss of consciousness and headaches.  Endo/Heme/Allergies: Does not bruise/bleed easily.  Psychiatric/Behavioral: Negative for depression, suicidal ideas, memory loss and substance abuse. The patient is not nervous/anxious and does not have insomnia.   All other systems reviewed and are negative.  14 point ROS was done and is otherwise as detailed above or in  HPI   PHYSICAL EXAMINATION: ECOG PERFORMANCE STATUS: 1 - Symptomatic but completely ambulatory  Filed Vitals:   07/23/15 1500  BP: 111/74  Pulse: 104  Temp: 98.3 F (36.8 C)  Resp: 16   Filed Weights   07/23/15 1500  Weight: 167 lb 1.6 oz (75.796 kg)     Physical Exam  Constitutional: She is oriented to person, place, and time and well-developed, well-nourished, and in no distress.  HENT:  Head: Normocephalic and atraumatic.  Nose: Nose normal.  Mouth/Throat: Oropharynx is clear and moist. No oropharyngeal exudate.  Tongue piercing  Eyes: Conjunctivae and EOM are normal. Pupils are equal, round, and reactive to light. Right eye exhibits no discharge. Left eye exhibits no discharge. No scleral icterus.  Neck: Normal range of motion. Neck supple. No tracheal deviation present. No thyromegaly present.  Cardiovascular: Normal rate, regular rhythm and normal heart sounds.  Exam reveals no gallop and no friction rub.   No murmur heard. Pulmonary/Chest: Effort normal and breath sounds normal. She has no wheezes. She has no rales.  Abdominal: Soft. Bowel  sounds are normal. She exhibits no distension and no mass. There is no tenderness. There is no rebound and no guarding.  Musculoskeletal: Normal range of motion. She exhibits no edema.  Lymphadenopathy:    She has no cervical adenopathy.  Neurological: She is alert and oriented to person, place, and time. She has normal reflexes. No cranial nerve deficit. Gait normal. Coordination normal.  Skin: Skin is warm and dry. No rash noted.  Psychiatric: Mood, memory, affect and judgment normal.  Nursing note and vitals reviewed.    LABORATORY DATA:  I have reviewed the data as listed Lab Results  Component Value Date   WBC 7.1 07/23/2015   HGB 10.2* 07/23/2015   HCT 33.7* 07/23/2015   MCV 66.6* 07/23/2015   PLT 408* 07/23/2015     ASSESSMENT & PLAN:  Iron deficiency anemia   Hemoglobin of 7.8 with MCV of 64.4 on  06/15/2015 Iron studies on 07/02/2015 with an iron saturation of 6%, serum iron of 23 UG/DL  I discussed with the patient that taking oral iron supplementation requires strict compliance. She has been on oral iron before but admits to not taking it on a daily basis. Given the degree of her anemia and iron deficiency she would strongly benefit from IV iron replacement.The most likely cause of her anemia is due to chronic blood loss/malabsorption syndrome. We discussed some of the risks, benefits, and alternatives of intravenous iron infusions. The patient is symptomatic from anemia and the iron level is critically low.  Some of the side-effects to be expected including risks of infusion reactions, phlebitis, headaches, nausea and fatigue.  The patient is willing to proceed. Patient education material was dispensed.  Goal is to keep ferritin level greater than or equal to 100 ng/ml  Follow-up in four weeks after IV iron is given, to check blood counts and see if she needs additional iron.   Orders Placed This Encounter  Procedures  . Ferritin  . CBC with Differential  . CBC with Differential    Standing Status: Future     Number of Occurrences:      Standing Expiration Date: 07/22/2016  . Reticulocytes    Standing Status: Future     Number of Occurrences:      Standing Expiration Date: 07/22/2016  . Ferritin    Standing Status: Future     Number of Occurrences:      Standing Expiration Date: 07/22/2016  . Iron and TIBC    Standing Status: Future     Number of Occurrences:      Standing Expiration Date: 07/22/2016    All questions were answered. The patient knows to call the clinic with any problems, questions or concerns.  This document serves as a record of services personally performed by Ancil Linsey, MD. It was created on her behalf by Toni Amend, a trained medical scribe. The creation of this record is based on the scribe's personal observations and the provider's  statements to them. This document has been checked and approved by the attending provider.  I have reviewed the above documentation for accuracy and completeness, and I agree with the above.   Molli Hazard, MD  08/18/2015 6:40 PM

## 2015-07-23 NOTE — Patient Instructions (Signed)
[place] at [facility] Discharge Instructions  RECOMMENDATIONS MADE BY THE CONSULTANT AND ANY TEST RESULTS WILL BE SENT TO YOUR REFERRING PHYSICIAN.  Lab work today. We will determine how much iron you need and get you set up for an iron infusion. Return as scheduled.  Thank you for choosing [place] at [facility] to provide your oncology and hematology care.  To afford each patient quality time with our provider, please arrive at least 15 minutes before your scheduled appointment time.    You need to re-schedule your appointment should you arrive 10 or more minutes late.  We strive to give you quality time with our providers, and arriving late affects you and other patients whose appointments are after yours.  Also, if you no show three or more times for appointments you may be dismissed from the clinic at the providers discretion.     Again, thank you for choosing [facility].  Our hope is that these requests will decrease the amount of time that you wait before being seen by our physicians.       _____________________________________________________________  Should you have questions after your visit to [facility], please contact our office at [phone] between the hours of 8:30 a.m. and 4:30 p.m.  Voicemails left after 4:30 p.m. will not be returned until the following business day.  For prescription refill requests, have your pharmacy contact our office.

## 2015-07-23 NOTE — Progress Notes (Signed)
[name] presented for [employer]. Labs per MD order drawn via Peripheral Line 23 gauge needle inserted in right antecubital.  Good blood return present. Procedure without incident.  Needle removed intact. Patient tolerated procedure well.

## 2015-07-24 ENCOUNTER — Ambulatory Visit (HOSPITAL_COMMUNITY): Payer: Commercial Managed Care - HMO | Admitting: Hematology & Oncology

## 2015-07-26 ENCOUNTER — Ambulatory Visit (HOSPITAL_COMMUNITY): Payer: Commercial Managed Care - HMO | Admitting: Hematology & Oncology

## 2015-07-30 ENCOUNTER — Encounter (HOSPITAL_BASED_OUTPATIENT_CLINIC_OR_DEPARTMENT_OTHER): Payer: Commercial Managed Care - HMO

## 2015-07-30 ENCOUNTER — Encounter (HOSPITAL_COMMUNITY): Payer: Self-pay

## 2015-07-30 DIAGNOSIS — D509 Iron deficiency anemia, unspecified: Secondary | ICD-10-CM

## 2015-07-30 MED ORDER — SODIUM CHLORIDE 0.9 % IV SOLN
INTRAVENOUS | Status: DC
  Administered 2015-07-30: 14:00:00 via INTRAVENOUS

## 2015-07-30 MED ORDER — FERUMOXYTOL INJECTION 510 MG/17 ML
510.0000 mg | Freq: Once | INTRAVENOUS | Status: AC
  Administered 2015-07-30: 510 mg via INTRAVENOUS
  Filled 2015-07-30: qty 17

## 2015-07-30 NOTE — Patient Instructions (Signed)
[place] at [facility] Discharge Instructions  RECOMMENDATIONS MADE BY THE CONSULTANT AND ANY TEST RESULTS WILL BE SENT TO YOUR REFERRING PHYSICIAN.  IV iron infusion Follow up as scheduled Please call the clinic if you have any questions or concerns  Thank you for choosing [place] at [facility] to provide your oncology and hematology care.  To afford each patient quality time with our provider, please arrive at least 15 minutes before your scheduled appointment time.    You need to re-schedule your appointment should you arrive 10 or more minutes late.  We strive to give you quality time with our providers, and arriving late affects you and other patients whose appointments are after yours.  Also, if you no show three or more times for appointments you may be dismissed from the clinic at the providers discretion.     Again, thank you for choosing [facility].  Our hope is that these requests will decrease the amount of time that you wait before being seen by our physicians.       _____________________________________________________________  Should you have questions after your visit to [facility], please contact our office at [phone] between the hours of 8:30 a.m. and 4:30 p.m.  Voicemails left after 4:30 p.m. will not be returned until the following business day.  For prescription refill requests, have your pharmacy contact our office.

## 2015-07-30 NOTE — Progress Notes (Signed)
Melissa Livingston Tolerated iron infusion well today Discharged ambulatory

## 2015-08-06 ENCOUNTER — Ambulatory Visit (HOSPITAL_COMMUNITY): Payer: Commercial Managed Care - HMO

## 2015-08-14 ENCOUNTER — Ambulatory Visit (HOSPITAL_COMMUNITY): Payer: Commercial Managed Care - HMO

## 2015-08-16 ENCOUNTER — Encounter (HOSPITAL_COMMUNITY): Payer: Commercial Managed Care - HMO | Attending: Hematology & Oncology

## 2015-08-16 ENCOUNTER — Encounter (HOSPITAL_COMMUNITY): Payer: Self-pay

## 2015-08-16 ENCOUNTER — Ambulatory Visit (HOSPITAL_COMMUNITY): Payer: Commercial Managed Care - HMO

## 2015-08-16 DIAGNOSIS — K909 Intestinal malabsorption, unspecified: Secondary | ICD-10-CM | POA: Diagnosis not present

## 2015-08-16 DIAGNOSIS — D508 Other iron deficiency anemias: Secondary | ICD-10-CM | POA: Diagnosis not present

## 2015-08-16 DIAGNOSIS — D509 Iron deficiency anemia, unspecified: Secondary | ICD-10-CM | POA: Insufficient documentation

## 2015-08-16 MED ORDER — SODIUM CHLORIDE 0.9 % IV SOLN
INTRAVENOUS | Status: DC
  Administered 2015-08-16: 13:00:00 via INTRAVENOUS

## 2015-08-16 MED ORDER — FERUMOXYTOL INJECTION 510 MG/17 ML
510.0000 mg | Freq: Once | INTRAVENOUS | Status: AC
  Administered 2015-08-16: 510 mg via INTRAVENOUS
  Filled 2015-08-16: qty 17

## 2015-08-16 NOTE — Progress Notes (Signed)
1330:  Tolerated infusion w/o adverse reaction.  Discharged ambulatory.

## 2015-08-16 NOTE — Patient Instructions (Signed)
[facility] at [facility] Discharge Instructions  RECOMMENDATIONS MADE BY THE CONSULTANT AND ANY TEST RESULTS WILL BE SENT TO YOUR REFERRING PHYSICIAN.  Iron infusion today. Return as scheduled for lab work and office visit.    Thank you for choosing [facility] at [facility] to provide your oncology and hematology care.  To afford each patient quality time with our Francoise Chojnowski, please arrive at least 15 minutes before your scheduled appointment time.    You need to re-schedule your appointment should you arrive 10 or more minutes late.  We strive to give you quality time with our providers, and arriving late affects you and other patients whose appointments are after yours.  Also, if you no show three or more times for appointments you may be dismissed from the clinic at the providers discretion.     Again, thank you for choosing [facility].  Our hope is that these requests will decrease the amount of time that you wait before being seen by our physicians.       _____________________________________________________________  Should you have questions after your visit to [facility], please contact our office at [phone] between the hours of 8:30 a.m. and 4:30 p.m.  Voicemails left after 4:30 p.m. will not be returned until the following business day.  For prescription refill requests, have your pharmacy contact our office.    

## 2015-08-18 ENCOUNTER — Encounter (HOSPITAL_COMMUNITY): Payer: Self-pay | Admitting: Hematology & Oncology

## 2015-08-23 ENCOUNTER — Ambulatory Visit: Payer: Commercial Managed Care - HMO | Admitting: Gastroenterology

## 2015-08-23 ENCOUNTER — Telehealth: Payer: Self-pay | Admitting: Gastroenterology

## 2015-08-23 NOTE — Telephone Encounter (Signed)
Pt was a no show

## 2015-08-28 ENCOUNTER — Other Ambulatory Visit (HOSPITAL_COMMUNITY): Payer: Commercial Managed Care - HMO

## 2015-08-28 ENCOUNTER — Ambulatory Visit (HOSPITAL_COMMUNITY): Payer: Commercial Managed Care - HMO | Admitting: Hematology & Oncology

## 2015-09-02 ENCOUNTER — Encounter (HOSPITAL_COMMUNITY): Payer: Self-pay

## 2015-09-05 NOTE — Progress Notes (Signed)
This encounter was created in error - please disregard.

## 2015-10-10 ENCOUNTER — Encounter: Payer: Self-pay | Admitting: *Deleted

## 2015-10-10 ENCOUNTER — Ambulatory Visit: Payer: Commercial Managed Care - HMO

## 2016-01-14 ENCOUNTER — Ambulatory Visit (INDEPENDENT_AMBULATORY_CARE_PROVIDER_SITE_OTHER): Payer: Commercial Managed Care - HMO | Admitting: *Deleted

## 2016-01-14 DIAGNOSIS — Z111 Encounter for screening for respiratory tuberculosis: Secondary | ICD-10-CM

## 2016-01-16 LAB — TB SKIN TEST
Induration: 0 mm
TB SKIN TEST: NEGATIVE

## 2016-01-27 ENCOUNTER — Ambulatory Visit (INDEPENDENT_AMBULATORY_CARE_PROVIDER_SITE_OTHER): Payer: Commercial Managed Care - HMO | Admitting: Nurse Practitioner

## 2016-01-27 ENCOUNTER — Encounter: Payer: Self-pay | Admitting: Nurse Practitioner

## 2016-01-27 VITALS — BP 116/74 | Ht 62.0 in | Wt 186.1 lb

## 2016-01-27 DIAGNOSIS — Z30013 Encounter for initial prescription of injectable contraceptive: Secondary | ICD-10-CM

## 2016-01-27 MED ORDER — PHENTERMINE HCL 37.5 MG PO TABS: 37.5000 mg | ORAL_TABLET | Freq: Every day | ORAL | Status: DC

## 2016-01-27 MED ORDER — MEDROXYPROGESTERONE ACETATE 150 MG/ML IM SUSP: 150.0000 mg | INTRAMUSCULAR | Status: DC

## 2016-01-27 NOTE — Progress Notes (Signed)
Subjective:  Presents to discuss restarting Depo Provera. This seems to work best for her. Has tried some other methods. Same sexual partner. Unprotected sex. Last intercourse was this weekend. Last menses 12/31/15. Would like to start Phentermine for weight loss. Has had slow steady weight gain since August that she attributes to stress.   Objective:   BP 116/74 mmHg  Ht 5\' 2"  (1.575 m)  Wt 186 lb 2 oz (84.426 kg)  BMI 34.03 kg/m2  LMP 12/31/2015 NAD. Alert, oriented. Lungs clear. Heart RRR.   Assessment:  Problem List Items Addressed This Visit      Other   Morbid obesity due to excess calories (HCC)   Relevant Medications   phentermine (ADIPEX-P) 37.5 MG tablet    Other Visit Diagnoses    Encounter for initial prescription of injectable contraceptive    -  Primary      Plan:  Meds ordered this encounter  Medications  . medroxyPROGESTERone (DEPO-PROVERA) 150 MG/ML injection    Sig: Inject 1 mL (150 mg total) into the muscle every 3 (three) months.    Dispense:  1 mL    Refill:  4    Order Specific Question:  Supervising Provider    Answer:  Mikey Kirschner [[id number]]  . phentermine (ADIPEX-P) 37.5 MG tablet    Sig: Take 1 tablet (37.5 mg total) by mouth daily before breakfast.    Dispense:  30 tablet    Refill:  0    Order Specific Question:  Supervising Provider    Answer:  Mikey Kirschner [[id number]]   To ensure that she is not pregnant and considering recent unprotected sex, patient to come in with Depo Provera within the first 4 days of her next menstrual cycle. Once she has received Depo Provera, she can be given a written Rx for Phentermine left at nurses' desk. Reviewed potential adverse effects. DC med and call if any problems. Otherwise recheck one month after starting med.

## 2016-02-05 ENCOUNTER — Encounter: Payer: Self-pay | Admitting: Adult Health

## 2016-02-05 ENCOUNTER — Ambulatory Visit (INDEPENDENT_AMBULATORY_CARE_PROVIDER_SITE_OTHER): Payer: Commercial Managed Care - HMO | Admitting: Adult Health

## 2016-02-05 VITALS — BP 140/82 | HR 102 | Ht 62.0 in | Wt 186.0 lb

## 2016-02-05 DIAGNOSIS — Z3201 Encounter for pregnancy test, result positive: Secondary | ICD-10-CM | POA: Diagnosis not present

## 2016-02-05 DIAGNOSIS — Z3490 Encounter for supervision of normal pregnancy, unspecified, unspecified trimester: Secondary | ICD-10-CM

## 2016-02-05 DIAGNOSIS — O3680X0 Pregnancy with inconclusive fetal viability, not applicable or unspecified: Secondary | ICD-10-CM

## 2016-02-05 HISTORY — DX: Encounter for supervision of normal pregnancy, unspecified, unspecified trimester: Z34.90

## 2016-02-05 LAB — POCT URINE PREGNANCY: PREG TEST UR: POSITIVE — AB

## 2016-02-05 NOTE — Progress Notes (Signed)
Subjective:     Patient ID: Melissa Livingston, female   DOB: 05/12/1996, 20 y.o.   MRN: [id number]  HPI Melissa Livingston is a  20 year old black female in for a UPT, she has had 2+HPT, no nausea or bleeding, this was not planned, but she is OK.She had wisdom teeth removed last week.   Review of Systems Patient denies any headaches, hearing loss, fatigue, blurred vision, shortness of breath, chest pain, abdominal pain, problems with bowel movements, urination, or intercourse. No joint pain or mood swings.See HPI for positives. Reviewed past medical,surgical, social and family history. Reviewed medications and allergies.     Objective:   Physical Exam BP 140/82 mmHg  Pulse 102  Ht 5\' 2"  (1.575 m)  Wt 186 lb (84.369 kg)  BMI 34.01 kg/m2  LMP 12/31/2015  Breastfeeding? No UPT + about 5+1 week by LMP, with EDD 10/06/16.Skin warm and dry. Neck: mid line trachea, normal thyroid, good ROM, no lymphadenopathy noted. Lungs: clear to ausculation bilaterally. Cardiovascular: regular rate and rhythm.abdomen is soft and non tender.    Assessment:     +UPT Pregnant     Plan:     Take OTC gummies Return in 2 weeks for dating [place] Review handout on first trimester

## 2016-02-05 NOTE — Patient Instructions (Signed)
First Trimester of Pregnancy The first trimester of pregnancy is from week 1 until the end of week 12 (months 1 through 3). A week after a sperm fertilizes an egg, the egg will implant on the wall of the uterus. This embryo will begin to develop into a baby. Genes from you and your partner are forming the baby. The female genes determine whether the baby is a boy or a girl. At 6-8 weeks, the eyes and face are formed, and the heartbeat can be seen on ultrasound. At the end of 12 weeks, all the baby's organs are formed.  Now that you are pregnant, you will want to do everything you can to have a healthy baby. Two of the most important things are to get good prenatal care and to follow your health care provider's instructions. Prenatal care is all the medical care you receive before the baby's birth. This care will help prevent, find, and treat any problems during the pregnancy and childbirth. BODY CHANGES Your body goes through many changes during pregnancy. The changes vary from woman to woman.   You may gain or lose a couple of pounds at first.  You may feel sick to your stomach (nauseous) and throw up (vomit). If the vomiting is uncontrollable, call your health care provider.  You may tire easily.  You may develop headaches that can be relieved by medicines approved by your health care provider.  You may urinate more often. Painful urination may mean you have a bladder infection.  You may develop heartburn as a result of your pregnancy.  You may develop constipation because certain hormones are causing the muscles that push waste through your intestines to slow down.  You may develop hemorrhoids or swollen, bulging veins (varicose veins).  Your breasts may begin to grow larger and become tender. Your nipples may stick out more, and the tissue that surrounds them (areola) may become darker.  Your gums may bleed and may be sensitive to brushing and flossing.  Dark spots or blotches (chloasma,  mask of pregnancy) may develop on your face. This will likely fade after the baby is born.  Your menstrual periods will stop.  You may have a loss of appetite.  You may develop cravings for certain kinds of food.  You may have changes in your emotions from day to day, such as being excited to be pregnant or being concerned that something may go wrong with the pregnancy and baby.  You may have more vivid and strange dreams.  You may have changes in your hair. These can include thickening of your hair, rapid growth, and changes in texture. Some women also have hair loss during or after pregnancy, or hair that feels dry or thin. Your hair will most likely return to normal after your baby is born. WHAT TO EXPECT AT YOUR PRENATAL VISITS During a routine prenatal visit:  You will be weighed to make sure you and the baby are growing normally.  Your blood pressure will be taken.  Your abdomen will be measured to track your baby's growth.  The fetal heartbeat will be listened to starting around week 10 or 12 of your pregnancy.  Test results from any previous visits will be discussed. Your health care provider may ask you:  How you are feeling.  If you are feeling the baby move.  If you have had any abnormal symptoms, such as leaking fluid, bleeding, severe headaches, or abdominal cramping.  If you are using any tobacco products,   including cigarettes, chewing tobacco, and electronic cigarettes.  If you have any questions. Other tests that may be performed during your first trimester include:  Blood tests to find your blood type and to check for the presence of any previous infections. They will also be used to check for low iron levels (anemia) and Rh antibodies. Later in the pregnancy, blood tests for diabetes will be done along with other tests if problems develop.  Urine tests to check for infections, diabetes, or protein in the urine.  An ultrasound to confirm the proper growth  and development of the baby.  An amniocentesis to check for possible genetic problems.  Fetal screens for spina bifida and Down syndrome.  You may need other tests to make sure you and the baby are doing well.  HIV (human immunodeficiency virus) testing. Routine prenatal testing includes screening for HIV, unless you choose not to have this test. HOME CARE INSTRUCTIONS  Medicines  Follow your health care provider's instructions regarding medicine use. Specific medicines may be either safe or unsafe to take during pregnancy.  Take your prenatal vitamins as directed.  If you develop constipation, try taking a stool softener if your health care provider approves. Diet  Eat regular, well-balanced meals. Choose a variety of foods, such as meat or vegetable-based protein, fish, milk and low-fat dairy products, vegetables, fruits, and whole grain breads and cereals. Your health care provider will help you determine the amount of weight gain that is right for you.  Avoid raw meat and uncooked cheese. These carry germs that can cause birth defects in the baby.  Eating four or five small meals rather than three large meals a day may help relieve nausea and vomiting. If you start to feel nauseous, eating a few soda crackers can be helpful. Drinking liquids between meals instead of during meals also seems to help nausea and vomiting.  If you develop constipation, eat more high-fiber foods, such as fresh vegetables or fruit and whole grains. Drink enough fluids to keep your urine clear or pale yellow. Activity and Exercise  Exercise only as directed by your health care provider. Exercising will help you:  Control your weight.  Stay in shape.  Be prepared for labor and delivery.  Experiencing pain or cramping in the lower abdomen or low back is a good sign that you should stop exercising. Check with your health care provider before continuing normal exercises.  Try to avoid standing for long  periods of time. Move your legs often if you must stand in one place for a long time.  Avoid heavy lifting.  Wear low-heeled shoes, and practice good posture.  You may continue to have sex unless your health care provider directs you otherwise. Relief of Pain or Discomfort  Wear a good support bra for breast tenderness.   Take warm sitz baths to soothe any pain or discomfort caused by hemorrhoids. Use hemorrhoid cream if your health care provider approves.   Rest with your legs elevated if you have leg cramps or low back pain.  If you develop varicose veins in your legs, wear support hose. Elevate your feet for 15 minutes, 3-4 times a day. Limit salt in your diet. Prenatal Care  Schedule your prenatal visits by the twelfth week of pregnancy. They are usually scheduled monthly at first, then more often in the last 2 months before delivery.  Write down your questions. Take them to your prenatal visits.  Keep all your prenatal visits as directed by your   health care provider. Safety  Wear your seat belt at all times when driving.  Make a list of emergency phone numbers, including numbers for family, friends, the hospital, and police and fire departments. General Tips  Ask your health care provider for a referral to a local prenatal education class. Begin classes no later than at the beginning of month 6 of your pregnancy.  Ask for help if you have counseling or nutritional needs during pregnancy. Your health care provider can offer advice or refer you to specialists for help with various needs.  Do not use hot tubs, steam rooms, or saunas.  Do not douche or use tampons or scented sanitary pads.  Do not cross your legs for long periods of time.  Avoid cat litter boxes and soil used by cats. These carry germs that can cause birth defects in the baby and possibly loss of the fetus by miscarriage or stillbirth.  Avoid all smoking, herbs, alcohol, and medicines not prescribed by  your health care provider. Chemicals in these affect the formation and growth of the baby.  Do not use any tobacco products, including cigarettes, chewing tobacco, and electronic cigarettes. If you need help quitting, ask your health care provider. You may receive counseling support and other resources to help you quit.  Schedule a dentist appointment. At home, brush your teeth with a soft toothbrush and be gentle when you floss. SEEK MEDICAL CARE IF:   You have dizziness.  You have mild pelvic cramps, pelvic pressure, or nagging pain in the abdominal area.  You have persistent nausea, vomiting, or diarrhea.  You have a bad smelling vaginal discharge.  You have pain with urination.  You notice increased swelling in your face, hands, legs, or ankles. SEEK IMMEDIATE MEDICAL CARE IF:   You have a fever.  You are leaking fluid from your vagina.  You have spotting or bleeding from your vagina.  You have severe abdominal cramping or pain.  You have rapid weight gain or loss.  You vomit blood or material that looks like coffee grounds.  You are exposed to German measles and have never had them.  You are exposed to fifth disease or chickenpox.  You develop a severe headache.  You have shortness of breath.  You have any kind of trauma, such as from a fall or a car accident.   This information is not intended to replace advice given to you by your health care provider. Make sure you discuss any questions you have with your health care provider.   Document Released: 11/17/2001 Document Revised: 12/14/2014 Document Reviewed: 10/03/2013 Elsevier Interactive Patient Education 2016 [employer]vier [employer] Return in 2 weeks for dating US 

## 2016-02-07 ENCOUNTER — Telehealth: Payer: Self-pay | Admitting: *Deleted

## 2016-02-07 NOTE — Telephone Encounter (Signed)
Pt never did pickup script for phentermine. Pt is pregnant. Script for phentermine shredded.

## 2016-02-17 ENCOUNTER — Encounter (HOSPITAL_COMMUNITY): Payer: Self-pay | Admitting: *Deleted

## 2016-02-17 ENCOUNTER — Telehealth: Payer: Self-pay | Admitting: Adult Health

## 2016-02-17 ENCOUNTER — Inpatient Hospital Stay (HOSPITAL_COMMUNITY)
Admission: AD | Admit: 2016-02-17 | Discharge: 2016-02-17 | Disposition: A | Discharge status: Home / Self Care | Payer: Medicaid Other | Source: Ambulatory Visit | Attending: Family Medicine | Admitting: Family Medicine

## 2016-02-17 ENCOUNTER — Telehealth: Payer: Self-pay | Admitting: Family Medicine

## 2016-02-17 DIAGNOSIS — A0819 Acute gastroenteropathy due to other small round viruses: Secondary | ICD-10-CM | POA: Diagnosis not present

## 2016-02-17 DIAGNOSIS — Z3A01 Less than 8 weeks gestation of pregnancy: Secondary | ICD-10-CM | POA: Diagnosis not present

## 2016-02-17 DIAGNOSIS — K219 Gastro-esophageal reflux disease without esophagitis: Secondary | ICD-10-CM | POA: Insufficient documentation

## 2016-02-17 DIAGNOSIS — A0811 Acute gastroenteropathy due to Norwalk agent: Secondary | ICD-10-CM | POA: Diagnosis not present

## 2016-02-17 DIAGNOSIS — O99611 Diseases of the digestive system complicating pregnancy, first trimester: Secondary | ICD-10-CM | POA: Diagnosis not present

## 2016-02-17 DIAGNOSIS — O9989 Other specified diseases and conditions complicating pregnancy, childbirth and the puerperium: Secondary | ICD-10-CM | POA: Diagnosis not present

## 2016-02-17 DIAGNOSIS — O210 Mild hyperemesis gravidarum: Secondary | ICD-10-CM | POA: Diagnosis present

## 2016-02-17 LAB — URINALYSIS, ROUTINE W REFLEX MICROSCOPIC
Bilirubin Urine: NEGATIVE
GLUCOSE, UA: NEGATIVE mg/dL
Ketones, ur: NEGATIVE mg/dL
Nitrite: NEGATIVE
Protein, ur: NEGATIVE mg/dL
Specific Gravity, Urine: <1.005 — ABNORMAL LOW (ref 1.005–1.030)
pH: 6.5 (ref 5.0–8.0)

## 2016-02-17 LAB — URINE MICROSCOPIC-ADD ON

## 2016-02-17 MED ORDER — PROMETHAZINE HCL 25 MG/ML IJ SOLN
12.5000 mg | Freq: Once | INTRAMUSCULAR | Status: AC
  Administered 2016-02-17: 12.5 mg via INTRAMUSCULAR
  Filled 2016-02-17: qty 1

## 2016-02-17 MED ORDER — PROMETHAZINE HCL 12.5 MG PO TABS: 12.5000 mg | ORAL_TABLET | Freq: Four times a day (QID) | ORAL | Status: DC | PRN

## 2016-02-17 NOTE — Telephone Encounter (Signed)
Has not eaten in 2 days, feels dry, has had diarrhea and vomiting, has [place] appt 3/15,di drink some water and voided, to go to MAU at [facility], may need IV fluids

## 2016-02-17 NOTE — MAU Provider Note (Signed)
History     CSN: [id number]  Arrival date and time: 02/17/16 1642   None     Chief Complaint  Patient presents with  . Emesis   HPI  [name] is a 20 y.o. female G2P1001 at [redacted]w[redacted]d presenting to MAU with N.V.D. Symptoms started on Saturday. Her Co-workers at her job had the same symptoms.   In the last 24 hours she vomited twice In the last 24 hours she has had 3 episodes of diarrhea.  Symptoms are starting to slow down.   She has not tried any medication over the counter for the symptoms.   She tried some crackers earlier and that made her nauseous.  OB History    Gravida Para Term Preterm AB TAB SAB Ectopic Multiple Living   2 1 1       1       Past Medical History  Diagnosis Date  . Asthma   . Pelvic pain in female 01/11/2015  . Nausea and vomiting 01/11/2015  . Endometritis 01/11/2015  . Ovarian cyst 01/14/2015  . History of ovarian cyst 01/28/2015  . GERD (gastroesophageal reflux disease)   . Pregnant 02/05/2016    Past Surgical History  Procedure Laterality Date  . None    . Esophagogastroduodenoscopy (egd) with esophageal dilation N/A 07/19/2013    Dr. Rourk:normal appearing esophagus s/p dilation, normal D1 and D2, unremarkable path   . Cesarean section N/A 09/29/2014    Procedure: CESAREAN SECTION;  Surgeon: Melissa Kind, MD;  Location: [facility];  Service: Obstetrics;  Laterality: N/A;  . Wisdom tooth extraction      Family History  Problem Relation Age of Onset  . Multiple sclerosis Mother   . Colon cancer Other     maternal great uncle, age greater than 51  . Heart disease Maternal Grandfather   . Heart disease Paternal Uncle   . Diabetes Paternal Uncle   . Hypertension Paternal Uncle   . Cancer Other     father's aunt had breast cancer    Social History  Substance Use Topics  . Smoking status: Never Smoker   . Smokeless tobacco: Never Used  . Alcohol Use: No    Allergies:  Allergies  Allergen Reactions  . Adhesive [Tape] Other (See  Comments)    blisters  . Lorabid [Loracarbef] Hives and Swelling  . Latex Itching and Rash  . Orange Fruit [Citrus] Rash    Prescriptions prior to admission  Medication Sig Dispense Refill Last Dose  . acetaminophen (TYLENOL) 325 MG tablet Take 650 mg by mouth every 6 (six) hours as needed for headache.   Past Week at Unknown time  . Iron-FA-B Cmp-C-Biot-Probiotic (FUSION PLUS) CAPS Take 1 capsule by mouth See admin instructions. 1 time daily between meals 30 capsule 6 02/16/2016 at Unknown time  . Prenatal Vit-Fe Fumarate-FA (PRENATAL MULTIVITAMIN) TABS tablet Take 1 tablet by mouth daily at 12 noon.   02/16/2016 at Unknown time  . promethazine (PHENERGAN) 25 MG tablet Take one bid prn nausea. Caution drowiness (Patient taking differently: Take 25 mg by mouth 2 (two) times daily as needed for nausea. Take one bid prn nausea. Caution drowiness) 12 tablet 0 02/16/2016 at Unknown time   Results for orders placed or performed during the hospital encounter of 02/17/16 (from the past 48 hour(s))  Urinalysis, Routine w reflex microscopic (not at [facility])     Status: Abnormal   Collection Time: 02/17/16  4:50 PM  Result Value Ref Range   Color,  Urine YELLOW YELLOW   APPearance CLEAR CLEAR   Specific Gravity, Urine <1.005 (L) 1.005 - 1.030   pH 6.5 5.0 - 8.0   Glucose, UA NEGATIVE NEGATIVE mg/dL   Hgb urine dipstick SMALL (A) NEGATIVE   Bilirubin Urine NEGATIVE NEGATIVE   Ketones, ur NEGATIVE NEGATIVE mg/dL   Protein, ur NEGATIVE NEGATIVE mg/dL   Nitrite NEGATIVE NEGATIVE   Leukocytes, UA LARGE (A) NEGATIVE  Urine microscopic-add on     Status: Abnormal   Collection Time: 02/17/16  4:50 PM  Result Value Ref Range   Squamous Epithelial / LPF 0-5 (A) NONE SEEN   WBC, UA 6-30 0 - 5 WBC/hpf   RBC / HPF 0-5 0 - 5 RBC/hpf   Bacteria, UA FEW (A) NONE SEEN    Review of Systems  Constitutional: Negative for fever and chills.  Gastrointestinal: Positive for heartburn, nausea, vomiting, abdominal pain  (Upper abdomial cramping and burning. ) and diarrhea. Negative for constipation.  Genitourinary: Negative for dysuria and urgency.       Pink vaginal spotting; last time she saw it was 0400 this morning when she wiped. None since.    Physical Exam   Blood pressure 114/63, pulse 86, temperature 99.3 F (37.4 C), temperature source Oral, resp. rate 18, weight 184 lb 9.6 oz (83.734 kg), last menstrual period 12/31/2015, not currently breastfeeding.  Physical Exam  Constitutional: She is oriented to person, place, and time. She appears well-developed and well-nourished. No distress.  HENT:  Head: Normocephalic.  Eyes: Pupils are equal, round, and reactive to light.  Respiratory: Effort normal.  GI: Soft.  Genitourinary:  Speculum exam: Vagina - Small amount of creamy discharge, no odor Cervix - No contact bleeding Chaperone present for exam.  Musculoskeletal: Normal range of motion.  Neurological: She is alert and oriented to person, place, and time.  Skin: Skin is warm. She is not diaphoretic.  Psychiatric: Her behavior is normal.    MAU Course  Procedures  None  MDM  Urine culture pending  Phenergan given IM   Assessment and Plan   A:  1. Norovirus     P:  Discharge home in stable condition RX: Phenergan; ok to use in the vagina or rectum if needed Follow up with OB as scheduled  Ok to use TUMS over the counter as directed on the bottle.  BRAT diet     Melissa Lye, NP 02/17/2016 6:59 PM

## 2016-02-17 NOTE — Discharge Instructions (Signed)
Norovirus Infection °A norovirus infection is caused by exposure to a virus in a group of similar viruses (noroviruses). This type of infection causes inflammation in your stomach and intestines (gastroenteritis). Norovirus is the most common cause of gastroenteritis. It also causes food poisoning. °Anyone can get a norovirus infection. It spreads very easily (contagious). You can get it from contaminated food, water, surfaces, or other people. Norovirus is found in the stool or vomit of infected people. You can spread the infection as soon as you feel sick until 2 weeks after you recover.  °Symptoms usually begin within 2 days after you become infected. Most norovirus symptoms affect the digestive system. °CAUSES °Norovirus infection is caused by contact with norovirus. You can catch norovirus if you: °· Eat or drink something contaminated with norovirus. °· Touch surfaces or objects contaminated with norovirus and then put your hand in your mouth. °· Have direct contact with an infected person who has symptoms. °· Share food, drink, or utensils with someone with who is sick with norovirus. °SIGNS AND SYMPTOMS °Symptoms of norovirus may include: °· Nausea. °· Vomiting. °· Diarrhea. °· Stomach cramps. °· Fever. °· Chills. °· Headache. °· Muscle aches. °· Tiredness. °DIAGNOSIS °Your health care provider may suspect norovirus based on your symptoms and physical exam. Your health care provider may also test a sample of your stool or vomit for the virus.  °TREATMENT °There is no specific treatment for norovirus. Most people get better without treatment in about 2 days. °HOME CARE INSTRUCTIONS °· Replace lost fluids by drinking plenty of water or rehydration fluids containing important minerals called electrolytes. This prevents dehydration. Drink enough fluid to keep your urine clear or pale yellow. °· Do not prepare food for others while you are infected. Wait at least 3 days after recovering from the illness to do  that. °PREVENTION  °· Wash your hands often, especially after using the toilet or changing a diaper. °· Wash fruits and vegetables thoroughly before preparing or serving them. °· Throw out any food that a sick person may have touched. °· Disinfect contaminated surfaces immediately after someone in the household has been sick. Use a bleach-based household cleaner. °· Immediately remove and wash soiled clothes or sheets. °SEEK MEDICAL CARE IF: °· Your vomiting, diarrhea, and stomach pain is getting worse. °· Your symptoms of norovirus do not go away after 2-3 days. °SEEK IMMEDIATE MEDICAL CARE IF:  °You develop symptoms of dehydration that do not improve with fluid replacement. This may include: °· Excessive sleepiness. °· Lack of tears. °· Dry mouth. °· Dizziness when standing. °· Weak pulse. °  °This information is not intended to replace advice given to you by your health care provider. Make sure you discuss any questions you have with your health care provider. °  °Document Released: 02/13/2003 Document Revised: 12/14/2014 Document Reviewed: 05/03/2014 °Elsevier Interactive Patient Education ©2016 Elsevier Inc. ° °

## 2016-02-17 NOTE — Telephone Encounter (Signed)
error 

## 2016-02-17 NOTE — MAU Note (Signed)
Pt states called ob/gyn and they instructed herto come to MAU.  Pt complains of nausea, vomiting, and diarrhea that started Saturday evening.  Pt states had a fever earlier today.

## 2016-02-18 LAB — URINE CULTURE: SPECIAL REQUESTS: NORMAL

## 2016-02-19 ENCOUNTER — Ambulatory Visit (INDEPENDENT_AMBULATORY_CARE_PROVIDER_SITE_OTHER): Payer: Commercial Managed Care - HMO

## 2016-02-19 DIAGNOSIS — O3680X0 Pregnancy with inconclusive fetal viability, not applicable or unspecified: Secondary | ICD-10-CM

## 2016-02-19 NOTE — Progress Notes (Signed)
[place] 5+6wks single IUP w/ys,pos fht 116 bpm,crl 3.41mm,simple corpus luteal cyst rt ov 2.7 x 2.4 x 1.6 cm,normal lt ov

## 2016-02-24 ENCOUNTER — Ambulatory Visit: Payer: Commercial Managed Care - HMO | Admitting: Nurse Practitioner

## 2016-03-02 ENCOUNTER — Encounter: Payer: Commercial Managed Care - HMO | Admitting: Women's Health

## 2016-03-04 ENCOUNTER — Ambulatory Visit (INDEPENDENT_AMBULATORY_CARE_PROVIDER_SITE_OTHER): Payer: Commercial Managed Care - HMO | Admitting: Women's Health

## 2016-03-04 ENCOUNTER — Encounter: Payer: Self-pay | Admitting: Women's Health

## 2016-03-04 VITALS — BP 110/60 | HR 84 | Wt 184.0 lb

## 2016-03-04 DIAGNOSIS — Z3491 Encounter for supervision of normal pregnancy, unspecified, first trimester: Secondary | ICD-10-CM

## 2016-03-04 DIAGNOSIS — Z0283 Encounter for blood-alcohol and blood-drug test: Secondary | ICD-10-CM

## 2016-03-04 DIAGNOSIS — Z23 Encounter for immunization: Secondary | ICD-10-CM

## 2016-03-04 DIAGNOSIS — Z1389 Encounter for screening for other disorder: Secondary | ICD-10-CM

## 2016-03-04 DIAGNOSIS — O0990 Supervision of high risk pregnancy, unspecified, unspecified trimester: Secondary | ICD-10-CM | POA: Insufficient documentation

## 2016-03-04 DIAGNOSIS — Z3682 Encounter for antenatal screening for nuchal translucency: Secondary | ICD-10-CM

## 2016-03-04 DIAGNOSIS — Z369 Encounter for antenatal screening, unspecified: Secondary | ICD-10-CM

## 2016-03-04 DIAGNOSIS — Z98891 History of uterine scar from previous surgery: Secondary | ICD-10-CM | POA: Insufficient documentation

## 2016-03-04 DIAGNOSIS — R87611 Atypical squamous cells cannot exclude high grade squamous intraepithelial lesion on cytologic smear of cervix (ASC-H): Secondary | ICD-10-CM

## 2016-03-04 DIAGNOSIS — O34219 Maternal care for unspecified type scar from previous cesarean delivery: Secondary | ICD-10-CM

## 2016-03-04 DIAGNOSIS — Z331 Pregnant state, incidental: Secondary | ICD-10-CM

## 2016-03-04 DIAGNOSIS — R87619 Unspecified abnormal cytological findings in specimens from cervix uteri: Secondary | ICD-10-CM | POA: Insufficient documentation

## 2016-03-04 DIAGNOSIS — Z8759 Personal history of other complications of pregnancy, childbirth and the puerperium: Secondary | ICD-10-CM

## 2016-03-04 NOTE — Patient Instructions (Addendum)
Begin taking a 81mg  baby aspirin daily at 12 weeks of pregnancy (4/27) to decrease risk of preeclampsia during pregnancy   Nausea & Vomiting  Have saltine crackers or pretzels by your bed and eat a few bites before you raise your head out of bed in the morning  Eat small frequent meals throughout the day instead of large meals  Drink plenty of fluids throughout the day to stay hydrated, just don't drink a lot of fluids with your meals.  This can make your stomach fill up faster making you feel sick  Do not brush your teeth right after you eat  Products with real ginger are good for nausea, like ginger ale and ginger hard candy Make sure it says made with real ginger!  Sucking on sour candy like lemon heads is also good for nausea  If your prenatal vitamins make you nauseated, take them at night so you will sleep through the nausea  Sea Bands  If you feel like you need medicine for the nausea & vomiting please let us know  If you are unable to keep any fluids or food down please let us know   Constipation  Drink plenty of fluid, preferably water, throughout the day  Eat foods high in fiber such as fruits, vegetables, and grains  Exercise, such as walking, is a good way to keep your bowels regular  Drink warm fluids, especially warm prune juice, or decaf coffee  Eat a 1/2 cup of real oatmeal (not instant), 1/2 cup applesauce, and 1/2-1 cup warm prune juice every day  If needed, you may take Colace (docusate sodium) stool softener once or twice a day to help keep the stool soft. If you are pregnant, wait until you are out of your first trimester (12-14 weeks of pregnancy)  If you still are having problems with constipation, you may take Miralax once daily as needed to help keep your bowels regular.  If you are pregnant, wait until you are out of your first trimester (12-14 weeks of pregnancy)   First Trimester of Pregnancy The first trimester of pregnancy is from week 1 until  the end of week 12 (months 1 through 3). A week after a sperm fertilizes an egg, the egg will implant on the wall of the uterus. This embryo will begin to develop into a baby. Genes from you and your partner are forming the baby. The female genes determine whether the baby is a boy or a girl. At 6-8 weeks, the eyes and face are formed, and the heartbeat can be seen on ultrasound. At the end of 12 weeks, all the baby's organs are formed.  Now that you are pregnant, you will want to do everything you can to have a healthy baby. Two of the most important things are to get good prenatal care and to follow your health care provider's instructions. Prenatal care is all the medical care you receive before the baby's birth. This care will help prevent, find, and treat any problems during the pregnancy and childbirth. BODY CHANGES Your body goes through many changes during pregnancy. The changes vary from woman to woman.   You may gain or lose a couple of pounds at first.  You may feel sick to your stomach (nauseous) and throw up (vomit). If the vomiting is uncontrollable, call your health care provider.  You may tire easily.  You may develop headaches that can be relieved by medicines approved by your health care provider.  You may urinate  more often. Painful urination may mean you have a bladder infection.  You may develop heartburn as a result of your pregnancy.  You may develop constipation because certain hormones are causing the muscles that push waste through your intestines to slow down.  You may develop hemorrhoids or swollen, bulging veins (varicose veins).  Your breasts may begin to grow larger and become tender. Your nipples may stick out more, and the tissue that surrounds them (areola) may become darker.  Your gums may bleed and may be sensitive to brushing and flossing.  Dark spots or blotches (chloasma, mask of pregnancy) may develop on your face. This will likely fade after the baby is  born.  Your menstrual periods will stop.  You may have a loss of appetite.  You may develop cravings for certain kinds of food.  You may have changes in your emotions from day to day, such as being excited to be pregnant or being concerned that something may go wrong with the pregnancy and baby.  You may have more vivid and strange dreams.  You may have changes in your hair. These can include thickening of your hair, rapid growth, and changes in texture. Some women also have hair loss during or after pregnancy, or hair that feels dry or thin. Your hair will most likely return to normal after your baby is born. WHAT TO EXPECT AT YOUR PRENATAL VISITS During a routine prenatal visit:  You will be weighed to make sure you and the baby are growing normally.  Your blood pressure will be taken.  Your abdomen will be measured to track your baby's growth.  The fetal heartbeat will be listened to starting around week 10 or 12 of your pregnancy.  Test results from any previous visits will be discussed. Your health care provider may ask you:  How you are feeling.  If you are feeling the baby move.  If you have had any abnormal symptoms, such as leaking fluid, bleeding, severe headaches, or abdominal cramping.  If you have any questions. Other tests that may be performed during your first trimester include:  Blood tests to find your blood type and to check for the presence of any previous infections. They will also be used to check for low iron levels (anemia) and Rh antibodies. Later in the pregnancy, blood tests for diabetes will be done along with other tests if problems develop.  Urine tests to check for infections, diabetes, or protein in the urine.  An ultrasound to confirm the proper growth and development of the baby.  An amniocentesis to check for possible genetic problems.  Fetal screens for spina bifida and Down syndrome.  You may need other tests to make sure you and the  baby are doing well. HOME CARE INSTRUCTIONS  Medicines  Follow your health care provider's instructions regarding medicine use. Specific medicines may be either safe or unsafe to take during pregnancy.  Take your prenatal vitamins as directed.  If you develop constipation, try taking a stool softener if your health care provider approves. Diet  Eat regular, well-balanced meals. Choose a variety of foods, such as meat or vegetable-based protein, fish, milk and low-fat dairy products, vegetables, fruits, and whole grain breads and cereals. Your health care provider will help you determine the amount of weight gain that is right for you.  Avoid raw meat and uncooked cheese. These carry germs that can cause birth defects in the baby.  Eating four or five small meals rather than three  large meals a day may help relieve nausea and vomiting. If you start to feel nauseous, eating a few soda crackers can be helpful. Drinking liquids between meals instead of during meals also seems to help nausea and vomiting.  If you develop constipation, eat more high-fiber foods, such as fresh vegetables or fruit and whole grains. Drink enough fluids to keep your urine clear or pale yellow. Activity and Exercise  Exercise only as directed by your health care provider. Exercising will help you:  Control your weight.  Stay in shape.  Be prepared for labor and delivery.  Experiencing pain or cramping in the lower abdomen or low back is a good sign that you should stop exercising. Check with your health care provider before continuing normal exercises.  Try to avoid standing for long periods of time. Move your legs often if you must stand in one place for a long time.  Avoid heavy lifting.  Wear low-heeled shoes, and practice good posture.  You may continue to have sex unless your health care provider directs you otherwise. Relief of Pain or Discomfort  Wear a good support bra for breast tenderness.    Take warm sitz baths to soothe any pain or discomfort caused by hemorrhoids. Use hemorrhoid cream if your health care provider approves.   Rest with your legs elevated if you have leg cramps or low back pain.  If you develop varicose veins in your legs, wear support hose. Elevate your feet for 15 minutes, 3-4 times a day. Limit salt in your diet. Prenatal Care  Schedule your prenatal visits by the twelfth week of pregnancy. They are usually scheduled monthly at first, then more often in the last 2 months before delivery.  Write down your questions. Take them to your prenatal visits.  Keep all your prenatal visits as directed by your health care provider. Safety  Wear your seat belt at all times when driving.  Make a list of emergency phone numbers, including numbers for family, friends, the hospital, and police and fire departments. General Tips  Ask your health care provider for a referral to a local prenatal education class. Begin classes no later than at the beginning of month 6 of your pregnancy.  Ask for help if you have counseling or nutritional needs during pregnancy. Your health care provider can offer advice or refer you to specialists for help with various needs.  Do not use hot tubs, steam rooms, or saunas.  Do not douche or use tampons or scented sanitary pads.  Do not cross your legs for long periods of time.  Avoid cat litter boxes and soil used by cats. These carry germs that can cause birth defects in the baby and possibly loss of the fetus by miscarriage or stillbirth.  Avoid all smoking, herbs, alcohol, and medicines not prescribed by your health care provider. Chemicals in these affect the formation and growth of the baby.  Schedule a dentist appointment. At home, brush your teeth with a soft toothbrush and be gentle when you floss. SEEK MEDICAL CARE IF:   You have dizziness.  You have mild pelvic cramps, pelvic pressure, or nagging pain in the abdominal  area.  You have persistent nausea, vomiting, or diarrhea.  You have a bad smelling vaginal discharge.  You have pain with urination.  You notice increased swelling in your face, hands, legs, or ankles. SEEK IMMEDIATE MEDICAL CARE IF:   You have a fever.  You are leaking fluid from your vagina.  You  have spotting or bleeding from your vagina.  You have severe abdominal cramping or pain.  You have rapid weight gain or loss.  You vomit blood or material that looks like coffee grounds.  You are exposed to [place] measles and have never had them.  You are exposed to fifth disease or chickenpox.  You develop a severe headache.  You have shortness of breath.  You have any kind of trauma, such as from a fall or a car accident. Document Released: 11/17/2001 Document Revised: 04/09/2014 Document Reviewed: 10/03/2013 [facility] Patient Information 2015 [place], [place]. This information is not intended to replace advice given to you by your health care provider. Make sure you discuss any questions you have with your health care provider.

## 2016-03-04 NOTE — Progress Notes (Signed)
Subjective:  Melissa Livingston is a 20 y.o. G49P1001 African American female at [redacted]w[redacted]d by 5.6wk u/s, being seen today for her first obstetrical visit.  Her obstetrical history is significant for IOL @ 39wks d/t GHTN- pushed x 1.5hrs, exhausted, baby LOP/LOT w/ prominent Lt ischial spine- so proceeded w/ c/s.  Pregnancy history fully reviewed. Abnormal pap 04/23/15 w/ normal colpo June 2016.   Patient reports some n/v- declines meds at present. GERD- was taking otc antacid but stopped w/ +PT. Denies vb, cramping, uti s/s, abnormal/malodorous vag d/c, or vulvovaginal itching/irritation.  BP 110/60 mmHg  Pulse 84  Wt 184 lb (83.462 kg)  LMP 12/31/2015  HISTORY: OB History  Gravida Para Term Preterm AB SAB TAB Ectopic Multiple Living  2 1 1       1     # Outcome Date GA Lbr Len/2nd Weight Sex Delivery Anes PTL Lv  2 Current           1 Term 09/29/14 [redacted]w[redacted]d 80:34 / 03:58 7 lb 4.8 oz (3.311 kg) F CS-LTranv EPI N Y     Past Medical History  Diagnosis Date  . Asthma   . Pelvic pain in female 01/11/2015  . Nausea and vomiting 01/11/2015  . Endometritis 01/11/2015  . Ovarian cyst 01/14/2015  . History of ovarian cyst 01/28/2015  . GERD (gastroesophageal reflux disease)   . Pregnant 02/05/2016   Past Surgical History  Procedure Laterality Date  . None    . Esophagogastroduodenoscopy (egd) with esophageal dilation N/A 07/19/2013    Dr. Rourk:normal appearing esophagus s/p dilation, normal D1 and D2, unremarkable path   . Cesarean section N/A 09/29/2014    Procedure: CESAREAN SECTION;  Surgeon: Jonnie Kind, MD;  Location: [facility];  Service: Obstetrics;  Laterality: N/A;  . Wisdom tooth extraction     Family History  Problem Relation Age of Onset  . Multiple sclerosis Mother   . Colon cancer Other     maternal great uncle, age greater than 64  . Heart disease Maternal Grandfather   . Heart disease Paternal Uncle   . Diabetes Paternal Uncle   . Hypertension Paternal Uncle   . Cancer Other     father's  aunt had breast cancer    Exam   System:     General: Well developed & nourished, no acute distress   Skin: Warm & dry, normal coloration and turgor, no rashes   Neurologic: Alert & oriented, normal mood   Cardiovascular: Regular rate & rhythm   Respiratory: Effort & rate normal, LCTAB, acyanotic   Abdomen: Soft, non tender   Extremities: normal strength, tone  Thin prep pap smear ASC-H w/ +HRHPV 04/2015 w/ normal colpo    Assessment:   Pregnancy: G2P1001 Patient Active Problem List   Diagnosis Date Noted  . Previous cesarean delivery, antepartum 03/04/2016    Priority: High  . Supervision of normal pregnancy 03/04/2016    Priority: High  . Abnormal Pap smear of cervix 03/04/2016    Priority: High  . History of gestational hypertension 04/23/2015    Priority: High  . Susceptible to varicella (non-immune), currently pregnant 02/28/2014    Priority: High  . Morbid obesity due to excess calories ([place]) 01/27/2016  . Pyelonephritis, acute 06/13/2015  . 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 Hypokalemia 06/13/2015  . Normocytic anemia 06/13/2015  . Total bilirubin, elevated 06/13/2015  . Hyponatremia 06/13/2015  . Hypomagnesemia 06/13/2015  . Dyspepsia 05/08/2015  . Dysphagia, pharyngoesophageal phase 05/08/2015  . Hematochezia  05/08/2015  . Oliguria 04/23/2015  . Ovarian cyst 01/14/2015  . Pelvic pain in female 01/11/2015  . Nausea and vomiting 01/11/2015  . Endometritis 01/11/2015  . Rectal bleeding 07/18/2013  . GERD (gastroesophageal reflux disease) 07/18/2013  . Esophageal dysphagia 07/18/2013  . Abdominal pain, epigastric 07/18/2013  . Gastritis 07/03/2013  . Irritable bowel syndrome 07/03/2013  . Depression 07/03/2013  . Asthma 03/27/2013  . FRACTURE, TOE 02/22/2008    [redacted]w[redacted]d G2P1001 New OB visit Prev c/s H/O abnormal pap H/O GHTN GERD  Plan:  Initial labs drawn Continue prenatal vitamins Problem list reviewed and updated Reviewed n/v  relief measures and warning s/s to report Reviewed recommended weight gain based on pre-gravid BMI Encouraged well-balanced diet Genetic Screening discussed Integrated Screen: requested Cystic fibrosis screening discussed neg prev preg Ultrasound discussed; fetal survey: requested Follow up in 4 weeks for 1st it/nt and visit [place] completed Discussed VBAC consent given to take home and review Pap after 04/22/16 Flu shot today Baby ASA daily @ 12wks d/t h/o GHTN OK to take OTC antacid for GERD  Tawnya Crook CNM, [facility] 03/04/2016 4:16 PM

## 2016-03-06 LAB — CBC
HEMOGLOBIN: 13.9 g/dL (ref 11.1–15.9)
Hematocrit: 41.8 % (ref 34.0–46.6)
MCH: 27.4 pg (ref 26.6–33.0)
MCHC: 33.3 g/dL (ref 31.5–35.7)
MCV: 82 fL (ref 79–97)
PLATELETS: 327 10*3/uL (ref 150–379)
RBC: 5.08 x10E6/uL (ref 3.77–5.28)
RDW: 13.2 % (ref 12.3–15.4)
WBC: 11 10*3/uL — AB (ref 3.4–10.8)

## 2016-03-06 LAB — HEPATITIS B SURFACE ANTIGEN: HEP B S AG: NEGATIVE

## 2016-03-06 LAB — URINALYSIS, ROUTINE W REFLEX MICROSCOPIC
Bilirubin, UA: NEGATIVE
Glucose, UA: NEGATIVE
Ketones, UA: NEGATIVE
NITRITE UA: NEGATIVE
RBC, UA: NEGATIVE
Specific Gravity, UA: 1.022 (ref 1.005–1.030)
Urobilinogen, Ur: 1 mg/dL (ref 0.2–1.0)
pH, UA: 6 (ref 5.0–7.5)

## 2016-03-06 LAB — PMP SCREEN PROFILE (10S), URINE
Amphetamine Screen, Ur: NEGATIVE ng/mL
BARBITURATE SCRN UR: NEGATIVE ng/mL
BENZODIAZEPINE SCREEN, URINE: NEGATIVE ng/mL
COCAINE(METAB.) SCREEN, URINE: NEGATIVE ng/mL
Cannabinoids Ur Ql Scn: NEGATIVE ng/mL
Creatinine(Crt), U: 86.8 mg/dL (ref 20.0–300.0)
METHADONE SCREEN, URINE: NEGATIVE ng/mL
OPIATE SCRN UR: NEGATIVE ng/mL
Oxycodone+Oxymorphone Ur Ql Scn: NEGATIVE ng/mL
PCP Scrn, Ur: NEGATIVE ng/mL
Ph of Urine: 6.2 (ref 4.5–8.9)
Propoxyphene, Screen: NEGATIVE ng/mL

## 2016-03-06 LAB — MICROSCOPIC EXAMINATION
Casts: NONE SEEN /lpf
Epithelial Cells (non renal): >10 /hpf — AB (ref 0–10)

## 2016-03-06 LAB — ANTIBODY SCREEN: ANTIBODY SCREEN: NEGATIVE

## 2016-03-06 LAB — RUBELLA SCREEN: RUBELLA: 1.85 {index} (ref >0.99)

## 2016-03-06 LAB — GC/CHLAMYDIA PROBE AMP
Chlamydia trachomatis, NAA: NEGATIVE
Neisseria gonorrhoeae by PCR: NEGATIVE

## 2016-03-06 LAB — ABO/RH: RH TYPE: POSITIVE

## 2016-03-06 LAB — RPR: RPR Ser Ql: NONREACTIVE

## 2016-03-06 LAB — URINE CULTURE

## 2016-03-06 LAB — HIV ANTIBODY (ROUTINE TESTING W REFLEX): HIV SCREEN 4TH GENERATION: NONREACTIVE

## 2016-03-06 LAB — VARICELLA ZOSTER ANTIBODY, IGG

## 2016-04-01 ENCOUNTER — Encounter: Payer: Commercial Managed Care - HMO | Admitting: Advanced Practice Midwife

## 2016-04-01 ENCOUNTER — Other Ambulatory Visit: Payer: Commercial Managed Care - HMO

## 2016-04-02 ENCOUNTER — Ambulatory Visit (INDEPENDENT_AMBULATORY_CARE_PROVIDER_SITE_OTHER): Payer: Commercial Managed Care - HMO

## 2016-04-02 ENCOUNTER — Ambulatory Visit (INDEPENDENT_AMBULATORY_CARE_PROVIDER_SITE_OTHER): Payer: Commercial Managed Care - HMO | Admitting: Advanced Practice Midwife

## 2016-04-02 ENCOUNTER — Encounter: Payer: Self-pay | Admitting: Advanced Practice Midwife

## 2016-04-02 VITALS — BP 124/80 | HR 78 | Wt 177.8 lb

## 2016-04-02 DIAGNOSIS — Z3A12 12 weeks gestation of pregnancy: Secondary | ICD-10-CM | POA: Diagnosis not present

## 2016-04-02 DIAGNOSIS — Z36 Encounter for antenatal screening of mother: Secondary | ICD-10-CM

## 2016-04-02 DIAGNOSIS — Z3491 Encounter for supervision of normal pregnancy, unspecified, first trimester: Secondary | ICD-10-CM

## 2016-04-02 DIAGNOSIS — Z369 Encounter for antenatal screening, unspecified: Secondary | ICD-10-CM

## 2016-04-02 DIAGNOSIS — Z1389 Encounter for screening for other disorder: Secondary | ICD-10-CM

## 2016-04-02 DIAGNOSIS — Z3682 Encounter for antenatal screening for nuchal translucency: Secondary | ICD-10-CM

## 2016-04-02 DIAGNOSIS — Z3481 Encounter for supervision of other normal pregnancy, first trimester: Secondary | ICD-10-CM

## 2016-04-02 DIAGNOSIS — Z331 Pregnant state, incidental: Secondary | ICD-10-CM

## 2016-04-02 LAB — POCT URINALYSIS DIPSTICK
Blood, UA: NEGATIVE
Glucose, UA: NEGATIVE
LEUKOCYTES UA: NEGATIVE
NITRITE UA: NEGATIVE
PROTEIN UA: NEGATIVE

## 2016-04-02 NOTE — Progress Notes (Signed)
[place] 12 wks,measurements c/w dates,crl 62.2 mm,post pl gr 0, normal ov's bilat,fhr 162 bpm, NB present,NT 1.5 mm

## 2016-04-02 NOTE — Progress Notes (Signed)
G2P1001 [redacted]w[redacted]d Estimated Date of Delivery: 10/15/16  Blood pressure 124/80, pulse 78, weight 177 lb 12.8 oz (80.65 kg), last menstrual period 12/31/2015, unknown if currently breastfeeding.   BP weight and urine results all reviewed and noted.  Please refer to the obstetrical flow sheet for the fundal height and fetal heart rate documentation: [place] 12 wks,measurements c/w dates,crl 62.2 mm,post pl gr 0, normal ov's bilat,fhr 162 bpm, NB present,NT 1.5 mm         Patient denies any bleeding and no rupture of membranes symptoms or regular contractions. Patient is without complaints. All questions were answered.  Orders Placed This Encounter  Procedures  . Maternal Screen, Integrated #1  . POCT Urinalysis Dipstick    Plan:  Continued routine obstetrical care, NTIT today  Return in about 4 weeks (around 04/30/2016) for 2nd IT, LROB.

## 2016-04-05 LAB — MATERNAL SCREEN, INTEGRATED #1
CROWN RUMP LENGTH MAT SCREEN: 62.2 mm
GEST. AGE ON COLLECTION DATE: 12.6 wk
MATERNAL AGE AT EDD: 22.5 a
NUMBER OF FETUSES: 1
Nuchal Translucency (NT): 1.5 mm
PAPP-A Value: 1461.9 ng/mL
WEIGHT: 178 [lb_av]

## 2016-04-28 ENCOUNTER — Encounter: Payer: Self-pay | Admitting: Obstetrics & Gynecology

## 2016-04-28 ENCOUNTER — Ambulatory Visit (INDEPENDENT_AMBULATORY_CARE_PROVIDER_SITE_OTHER): Payer: Commercial Managed Care - HMO | Admitting: Obstetrics & Gynecology

## 2016-04-28 VITALS — BP 110/80 | HR 80 | Wt 180.0 lb

## 2016-04-28 DIAGNOSIS — Z3482 Encounter for supervision of other normal pregnancy, second trimester: Secondary | ICD-10-CM

## 2016-04-28 DIAGNOSIS — Z369 Encounter for antenatal screening, unspecified: Secondary | ICD-10-CM

## 2016-04-28 DIAGNOSIS — Z3A16 16 weeks gestation of pregnancy: Secondary | ICD-10-CM

## 2016-04-28 DIAGNOSIS — Z3492 Encounter for supervision of normal pregnancy, unspecified, second trimester: Secondary | ICD-10-CM

## 2016-04-28 DIAGNOSIS — Z1389 Encounter for screening for other disorder: Secondary | ICD-10-CM

## 2016-04-28 DIAGNOSIS — Z331 Pregnant state, incidental: Secondary | ICD-10-CM

## 2016-04-28 LAB — POCT URINALYSIS DIPSTICK
Blood, UA: NEGATIVE
Glucose, UA: NEGATIVE
KETONES UA: NEGATIVE
Leukocytes, UA: NEGATIVE
Nitrite, UA: NEGATIVE

## 2016-04-28 NOTE — Progress Notes (Signed)
G2P1001 [redacted]w[redacted]d Estimated Date of Delivery: 10/15/16  Blood pressure 110/80, pulse 80, weight 180 lb (81.647 kg), last menstrual period 12/31/2015, unknown if currently breastfeeding.   BP weight and urine results all reviewed and noted.  Please refer to the obstetrical flow sheet for the fundal height and fetal heart rate documentation:  Patient reports good fetal movement, denies any bleeding and no rupture of membranes symptoms or regular contractions. Patient is without complaints. All questions were answered.  Orders Placed This Encounter  Procedures  . US OB Comp + 14 Wk  . Maternal Screen, Integrated #2  . POCT urinalysis dipstick    Plan:  Continued routine obstetrical care, 2nd IT today  20 weeks sonogram 4 weeks  Return in about 4 weeks (around 05/26/2016) for 20 week sono, LROB.

## 2016-04-29 ENCOUNTER — Telehealth: Payer: Self-pay | Admitting: Women's Health

## 2016-04-29 NOTE — Telephone Encounter (Signed)
C/o burning with urination, lower back pain. Pt given an appt for tomorrow

## 2016-04-30 ENCOUNTER — Ambulatory Visit (INDEPENDENT_AMBULATORY_CARE_PROVIDER_SITE_OTHER): Payer: Commercial Managed Care - HMO | Admitting: Obstetrics & Gynecology

## 2016-04-30 ENCOUNTER — Encounter: Payer: Commercial Managed Care - HMO | Admitting: Advanced Practice Midwife

## 2016-04-30 ENCOUNTER — Encounter: Payer: Self-pay | Admitting: Obstetrics & Gynecology

## 2016-04-30 VITALS — BP 120/80 | HR 80 | Wt 182.0 lb

## 2016-04-30 DIAGNOSIS — N390 Urinary tract infection, site not specified: Secondary | ICD-10-CM | POA: Diagnosis not present

## 2016-04-30 DIAGNOSIS — Z1389 Encounter for screening for other disorder: Secondary | ICD-10-CM

## 2016-04-30 DIAGNOSIS — R350 Frequency of micturition: Secondary | ICD-10-CM

## 2016-04-30 DIAGNOSIS — O2342 Unspecified infection of urinary tract in pregnancy, second trimester: Secondary | ICD-10-CM

## 2016-04-30 DIAGNOSIS — Z331 Pregnant state, incidental: Secondary | ICD-10-CM | POA: Diagnosis not present

## 2016-04-30 DIAGNOSIS — R300 Dysuria: Secondary | ICD-10-CM

## 2016-04-30 LAB — MATERNAL SCREEN, INTEGRATED #2
ADSF: 0.99
AFP MOM: 0.89
Alpha-Fetoprotein: 27.5 ng/mL
Crown Rump Length: 62.2 mm
DIA MoM: 0.68
DIA VALUE: 107.3 pg/mL
ESTRIOL UNCONJUGATED: 0.81 ng/mL
GEST. AGE ON COLLECTION DATE: 12.6 wk
Gestational Age: 16.3 weeks
HCG MOM: 0.89
HCG VALUE: 26.9 [IU]/mL
MATERNAL AGE AT EDD: 22.5 a
NUCHAL TRANSLUCENCY (NT): 1.5 mm
NUMBER OF FETUSES: 1
Nuchal Translucency MoM: 0.95
PAPP-A MoM: 1.83
PAPP-A Value: 1461.9 ng/mL
Test Results:: NEGATIVE
WEIGHT: 178 [lb_av]
Weight: 180 [lb_av]

## 2016-04-30 LAB — POCT URINALYSIS DIPSTICK
Glucose, UA: NEGATIVE
LEUKOCYTES UA: NEGATIVE
NITRITE UA: NEGATIVE
PROTEIN UA: NEGATIVE
RBC UA: NEGATIVE

## 2016-04-30 MED ORDER — CEPHALEXIN 500 MG PO CAPS: 500.0000 mg | ORAL_CAPSULE | Freq: Four times a day (QID) | ORAL | Status: DC

## 2016-04-30 MED ORDER — CEFTRIAXONE SODIUM 1 G IJ SOLR
1.0000 g | Freq: Once | INTRAMUSCULAR | Status: AC
  Administered 2016-04-30: 1 g via INTRAMUSCULAR

## 2016-04-30 NOTE — Progress Notes (Signed)
Work in ob appt: [redacted]w[redacted]d Estimated Date of Delivery: 10/15/16   Pt presents complaining of burnig frequency pressure now for a few days Urinalysis WBC neg nitrites  Pt has had UTI previously feels the same     UTI (urinary tract infection) in pregnancy in second trimester  Pregnant state, incidental - Plan: POCT urinalysis dipstick  Screening for genitourinary condition - Plan: POCT urinalysis dipstick  Burning with urination - Plan: Urine culture  Meds ordered this encounter  Medications  . DISCONTD: cephALEXin (KEFLEX) 500 MG capsule    Sig: Take 1 capsule (500 mg total) by mouth 4 (four) times daily.    Dispense:  28 capsule    Refill:  0  . cefTRIAXone (ROCEPHIN) injection 1 g    Order Specific Question:   Antibiotic Indication:    Answer:   UTI   Return for keep.      Face to face time:  10 minutes  Greater than 50% of the visit time was spent in counseling and coordination of care with the patient.  The summary and outline of the counseling and care coordination is summarized in the note above.   All questions were answered.

## 2016-05-02 LAB — URINE CULTURE

## 2016-05-25 ENCOUNTER — Other Ambulatory Visit: Payer: Self-pay | Admitting: Obstetrics & Gynecology

## 2016-05-25 DIAGNOSIS — Z1389 Encounter for screening for other disorder: Secondary | ICD-10-CM

## 2016-05-26 ENCOUNTER — Encounter: Payer: Self-pay | Admitting: Women's Health

## 2016-05-26 ENCOUNTER — Other Ambulatory Visit (HOSPITAL_COMMUNITY)
Admission: RE | Admit: 2016-05-26 | Discharge: 2016-05-26 | Disposition: A | Discharge status: Home / Self Care | Payer: Medicaid Other | Source: Ambulatory Visit | Attending: Obstetrics & Gynecology | Admitting: Obstetrics & Gynecology

## 2016-05-26 ENCOUNTER — Ambulatory Visit (INDEPENDENT_AMBULATORY_CARE_PROVIDER_SITE_OTHER): Payer: Commercial Managed Care - HMO

## 2016-05-26 ENCOUNTER — Ambulatory Visit (INDEPENDENT_AMBULATORY_CARE_PROVIDER_SITE_OTHER): Payer: Commercial Managed Care - HMO | Admitting: Women's Health

## 2016-05-26 VITALS — BP 118/64 | HR 64 | Wt 185.0 lb

## 2016-05-26 DIAGNOSIS — Z3492 Encounter for supervision of normal pregnancy, unspecified, second trimester: Secondary | ICD-10-CM

## 2016-05-26 DIAGNOSIS — Z01419 Encounter for gynecological examination (general) (routine) without abnormal findings: Secondary | ICD-10-CM | POA: Insufficient documentation

## 2016-05-26 DIAGNOSIS — Z1389 Encounter for screening for other disorder: Secondary | ICD-10-CM

## 2016-05-26 DIAGNOSIS — Z8759 Personal history of other complications of pregnancy, childbirth and the puerperium: Secondary | ICD-10-CM

## 2016-05-26 DIAGNOSIS — Z3A20 20 weeks gestation of pregnancy: Secondary | ICD-10-CM | POA: Diagnosis not present

## 2016-05-26 DIAGNOSIS — Z331 Pregnant state, incidental: Secondary | ICD-10-CM

## 2016-05-26 DIAGNOSIS — Z36 Encounter for antenatal screening of mother: Secondary | ICD-10-CM

## 2016-05-26 DIAGNOSIS — Z3482 Encounter for supervision of other normal pregnancy, second trimester: Secondary | ICD-10-CM

## 2016-05-26 DIAGNOSIS — Z124 Encounter for screening for malignant neoplasm of cervix: Secondary | ICD-10-CM

## 2016-05-26 DIAGNOSIS — O34219 Maternal care for unspecified type scar from previous cesarean delivery: Secondary | ICD-10-CM

## 2016-05-26 NOTE — Progress Notes (Signed)
Low-risk OB appointment G2P1001 [redacted]w[redacted]d Estimated Date of Delivery: 10/15/16 BP 118/64 mmHg  Pulse 64  Wt 185 lb (83.915 kg)  LMP 12/31/2015  BP, weight,  reviewed.  Unable to void. Refer to obstetrical flow sheet for FH & FHR.  Reports good fm.  Denies regular uc's, lof, vb, or uti s/s. No complaints. Thin prep pap obtained today Reviewed today's normal anatomy u/s, warning s/s to report. Plan:  Continue routine obstetrical care  F/U in 4wks for OB appointment

## 2016-05-26 NOTE — Patient Instructions (Signed)

## 2016-05-26 NOTE — Progress Notes (Signed)
[place] [id number] wks,cephalic,cx 3.6 cm,normal ov's bilat,post pl gr 0,svp of fluid 6.3 cm,fhr 158 bpm,efw 326 g,anatomy complete,no obvious abnormalities seen

## 2016-05-27 LAB — CYTOLOGY - PAP

## 2016-06-10 ENCOUNTER — Telehealth: Payer: Self-pay | Admitting: *Deleted

## 2016-06-10 NOTE — Telephone Encounter (Signed)
Pt c/o retaining a lot of fluid feet and legs, c/o aching and skin peeling off of her feet. Pt states some improvement with elevation of extremities. Pt given an appt for evaluation tomorrow at 10:45 am with Knute Neu, CNM.

## 2016-06-11 ENCOUNTER — Encounter: Payer: Self-pay | Admitting: Women's Health

## 2016-06-11 ENCOUNTER — Ambulatory Visit (INDEPENDENT_AMBULATORY_CARE_PROVIDER_SITE_OTHER): Payer: Commercial Managed Care - HMO | Admitting: Women's Health

## 2016-06-11 VITALS — BP 120/80 | HR 94 | Wt 186.0 lb

## 2016-06-11 DIAGNOSIS — Z3A22 22 weeks gestation of pregnancy: Secondary | ICD-10-CM

## 2016-06-11 DIAGNOSIS — O1202 Gestational edema, second trimester: Secondary | ICD-10-CM

## 2016-06-11 DIAGNOSIS — Z1389 Encounter for screening for other disorder: Secondary | ICD-10-CM

## 2016-06-11 DIAGNOSIS — Z3492 Encounter for supervision of normal pregnancy, unspecified, second trimester: Secondary | ICD-10-CM

## 2016-06-11 DIAGNOSIS — Z331 Pregnant state, incidental: Secondary | ICD-10-CM

## 2016-06-11 DIAGNOSIS — Z3482 Encounter for supervision of other normal pregnancy, second trimester: Secondary | ICD-10-CM

## 2016-06-11 LAB — POCT URINALYSIS DIPSTICK
Blood, UA: NEGATIVE
GLUCOSE UA: NEGATIVE
GLUCOSE UA: NEGATIVE
KETONES UA: NEGATIVE
Ketones, UA: NEGATIVE
Leukocytes, UA: NEGATIVE
Leukocytes, UA: NEGATIVE
NITRITE UA: NEGATIVE
Nitrite, UA: NEGATIVE
PROTEIN UA: NEGATIVE
Protein, UA: NEGATIVE
RBC UA: NEGATIVE

## 2016-06-11 NOTE — Progress Notes (Signed)
Work-in Low-risk OB appointment [id number] [redacted]w[redacted]d Estimated Date of Delivery: 10/15/16 BP 120/80 mmHg  Pulse 94  Wt 186 lb (84.369 kg)  LMP 12/31/2015  BP, weight, and urine reviewed.  Refer to obstetrical flow sheet for FH & FHR.  Reports good fm.  Denies regular uc's, lof, vb, or uti s/s. Swelling in legs while at work- works at [employer], doesn't get a break. To get highest non-rx strength compression stockings from [place] and wear at work. Note given to take 10-49min break once/shift to elevate legs.  No swelling today, hasn't worked since [place] warning s/s to report. Plan:  Continue routine obstetrical care  F/U as scheduled for OB appointment

## 2016-06-11 NOTE — Progress Notes (Signed)
Pt worked in today for swelling in her legs and feet. Pt states that the swelling causes itching.

## 2016-06-23 ENCOUNTER — Ambulatory Visit (INDEPENDENT_AMBULATORY_CARE_PROVIDER_SITE_OTHER): Payer: Commercial Managed Care - HMO | Admitting: Women's Health

## 2016-06-23 ENCOUNTER — Encounter: Payer: Self-pay | Admitting: Women's Health

## 2016-06-23 VITALS — BP 120/80 | HR 98 | Wt 190.0 lb

## 2016-06-23 DIAGNOSIS — Z3482 Encounter for supervision of other normal pregnancy, second trimester: Secondary | ICD-10-CM

## 2016-06-23 DIAGNOSIS — Z3A24 24 weeks gestation of pregnancy: Secondary | ICD-10-CM | POA: Diagnosis not present

## 2016-06-23 DIAGNOSIS — Z331 Pregnant state, incidental: Secondary | ICD-10-CM

## 2016-06-23 DIAGNOSIS — Z1389 Encounter for screening for other disorder: Secondary | ICD-10-CM

## 2016-06-23 DIAGNOSIS — O1202 Gestational edema, second trimester: Secondary | ICD-10-CM

## 2016-06-23 DIAGNOSIS — Z3492 Encounter for supervision of normal pregnancy, unspecified, second trimester: Secondary | ICD-10-CM

## 2016-06-23 LAB — POCT URINALYSIS DIPSTICK
Blood, UA: NEGATIVE
GLUCOSE UA: NEGATIVE
KETONES UA: NEGATIVE
Leukocytes, UA: NEGATIVE
Nitrite, UA: NEGATIVE

## 2016-06-23 NOTE — Progress Notes (Signed)
Pt denies any problems or concerns at this time.  

## 2016-06-23 NOTE — Progress Notes (Signed)
Low-risk OB appointment G2P1001 [redacted]w[redacted]d Estimated Date of Delivery: 10/15/16 BP 120/80 mmHg  Pulse 98  Wt 190 lb (86.183 kg)  LMP 12/31/2015  BP, weight, and urine reviewed.  Refer to obstetrical flow sheet for FH & FHR.  Reports good fm.  Denies regular uc's, lof, vb, or uti s/s. Lt sciatica pain- gave printed relief measures/exercises. Swelling has improved w/ compression stockings. Some pressure when up on feet at work- to get maternity belt.  Reviewed ptl s/s, fm. Plan:  Continue routine obstetrical care  F/U in 4wks for OB appointment and pn2

## 2016-06-23 NOTE — Patient Instructions (Signed)
You will have your sugar test next visit.  Please do not eat or drink anything after midnight the night before you come, not even water.  You will be here for at least two hours.     Call the office [phone]) or go to [facility] if:  You begin to have strong, frequent contractions  Your water breaks.  Sometimes it is a big gush of fluid, sometimes it is just a trickle that keeps getting your panties wet or running down your legs  You have vaginal bleeding.  It is normal to have a small amount of spotting if your cervix was checked.   You don't feel your baby moving like normal.  If you don't, get you something to eat and drink and lay down and focus on feeling your baby move.   If your baby is still not moving like normal, you should call the office or go to [facility] The second trimester is from week 13 through week 28, months 4 through 6. The second trimester is often a time when you feel your best. Your body has also adjusted to being pregnant, and you begin to feel better physically. Usually, morning sickness has lessened or quit completely, you may have more energy, and you may have an increase in appetite. The second trimester is also a time when the fetus is growing rapidly. At the end of the sixth month, the fetus is about 9 inches long and weighs about 1 pounds. You will likely begin to feel the baby move (quickening) between 18 and 20 weeks of the pregnancy. BODY CHANGES Your body goes through many changes during pregnancy. The changes vary from woman to woman.   Your weight will continue to increase. You will notice your lower abdomen bulging out.  You may begin to get stretch marks on your hips, abdomen, and breasts.  You may develop headaches that can be relieved by medicines approved by your health care provider.  You may urinate more often because the fetus is pressing on your bladder.  You may develop or continue to have  heartburn as a result of your pregnancy.  You may develop constipation because certain hormones are causing the muscles that push waste through your intestines to slow down.  You may develop hemorrhoids or swollen, bulging veins (varicose veins).  You may have back pain because of the weight gain and pregnancy hormones relaxing your joints between the bones in your pelvis and as a result of a shift in weight and the muscles that support your balance.  Your breasts will continue to grow and be tender.  Your gums may bleed and may be sensitive to brushing and flossing.  Dark spots or blotches (chloasma, mask of pregnancy) may develop on your face. This will likely fade after the baby is born.  A dark line from your belly button to the pubic area (linea nigra) may appear. This will likely fade after the baby is born.  You may have changes in your hair. These can include thickening of your hair, rapid growth, and changes in texture. Some women also have hair loss during or after pregnancy, or hair that feels dry or thin. Your hair will most likely return to normal after your baby is born. WHAT TO EXPECT AT YOUR PRENATAL VISITS During a routine prenatal visit:  You will be weighed to make sure you and the fetus are growing normally.  Your blood pressure will be taken.  Your abdomen will be measured to track your baby's growth.  The fetal heartbeat will be listened to.  Any test results from the previous visit will be discussed. Your health care provider may ask you:  How you are feeling.  If you are feeling the baby move.  If you have had any abnormal symptoms, such as leaking fluid, bleeding, severe headaches, or abdominal cramping.  If you have any questions. Other tests that may be performed during your second trimester include:  Blood tests that check for:  Low iron levels (anemia).  Gestational diabetes (between 24 and 28 weeks).  Rh antibodies.  Urine tests to check  for infections, diabetes, or protein in the urine.  An ultrasound to confirm the proper growth and development of the baby.  An amniocentesis to check for possible genetic problems.  Fetal screens for spina bifida and Down syndrome. HOME CARE INSTRUCTIONS   Avoid all smoking, herbs, alcohol, and unprescribed drugs. These chemicals affect the formation and growth of the baby.  Follow your health care provider's instructions regarding medicine use. There are medicines that are either safe or unsafe to take during pregnancy.  Exercise only as directed by your health care provider. Experiencing uterine cramps is a good sign to stop exercising.  Continue to eat regular, healthy meals.  Wear a good support bra for breast tenderness.  Do not use hot tubs, steam rooms, or saunas.  Wear your seat belt at all times when driving.  Avoid raw meat, uncooked cheese, cat litter boxes, and soil used by cats. These carry germs that can cause birth defects in the baby.  Take your prenatal vitamins.  Try taking a stool softener (if your health care provider approves) if you develop constipation. Eat more high-fiber foods, such as fresh vegetables or fruit and whole grains. Drink plenty of fluids to keep your urine clear or pale yellow.  Take warm sitz baths to soothe any pain or discomfort caused by hemorrhoids. Use hemorrhoid cream if your health care provider approves.  If you develop varicose veins, wear support hose. Elevate your feet for 15 minutes, 3-4 times a day. Limit salt in your diet.  Avoid heavy lifting, wear low heel shoes, and practice good posture.  Rest with your legs elevated if you have leg cramps or low back pain.  Visit your dentist if you have not gone yet during your pregnancy. Use a soft toothbrush to brush your teeth and be gentle when you floss.  A sexual relationship may be continued unless your health care provider directs you otherwise.  Continue to go to all your  prenatal visits as directed by your health care provider. SEEK MEDICAL CARE IF:   You have dizziness.  You have mild pelvic cramps, pelvic pressure, or nagging pain in the abdominal area.  You have persistent nausea, vomiting, or diarrhea.  You have a bad smelling vaginal discharge.  You have pain with urination. SEEK IMMEDIATE MEDICAL CARE IF:   You have a fever.  You are leaking fluid from your vagina.  You have spotting or bleeding from your vagina.  You have severe abdominal cramping or pain.  You have rapid weight gain or loss.  You have shortness of breath with chest pain.  You notice sudden or extreme swelling of your face, hands, ankles, feet, or legs.  You have not felt your baby move in over an hour.  You have severe headaches that do not go away with medicine.  You have vision changes.  Document Released: 11/17/2001 Document Revised: 11/28/2013 Document Reviewed: 01/24/2013 [facility] Patient Information 2015 [place], [place]. This information is not intended to replace advice given to you by your health care provider. Make sure you discuss any questions you have with your health care provider.     Sciatica With Rehab The sciatic nerve runs from the back down the leg and is responsible for sensation and control of the muscles in the back (posterior) side of the thigh, lower leg, and foot. Sciatica is a condition that is characterized by inflammation of this nerve.  SYMPTOMS   Signs of nerve damage, including numbness and/or weakness along the posterior side of the lower extremity.  Pain in the back of the thigh that may also travel down the leg.  Pain that worsens when sitting for long periods of time.  Occasionally, pain in the back or buttock. CAUSES  Inflammation of the sciatic nerve is the cause of sciatica. The inflammation is due to something irritating the nerve. Common sources of irritation include:  Sitting for long periods of time.  Direct trauma  to the nerve.  Arthritis of the spine.  Herniated or ruptured disk.  Slipping of the vertebrae (spondylolisthesis).  Pressure from soft tissues, such as muscles or ligament-like tissue (fascia). RISK INCREASES WITH:  Sports that place pressure or stress on the spine (football or weightlifting).  Poor strength and flexibility.  Failure to warm up properly before activity.  Family history of low back pain or disk disorders.  Previous back injury or surgery.  Poor body mechanics, especially when lifting, or poor posture. PREVENTION   Warm up and stretch properly before activity.  Maintain physical fitness:  Strength, flexibility, and endurance.  Cardiovascular fitness.  Learn and use proper technique, especially with posture and lifting. When possible, have coach correct improper technique.  Avoid activities that place stress on the spine. PROGNOSIS If treated properly, then sciatica usually resolves within 6 weeks. However, occasionally surgery is necessary.  RELATED COMPLICATIONS   Permanent nerve damage, including pain, numbness, tingle, or weakness.  Chronic back pain.  Risks of surgery: infection, bleeding, nerve damage, or damage to surrounding tissues. TREATMENT Treatment initially involves resting from any activities that aggravate your symptoms. The use of ice and medication may help reduce pain and inflammation. The use of strengthening and stretching exercises may help reduce pain with activity. These exercises may be performed at home or with referral to a therapist. A therapist may recommend further treatments, such as transcutaneous electronic nerve stimulation (TENS) or ultrasound. Your caregiver may recommend corticosteroid injections to help reduce inflammation of the sciatic nerve. If symptoms persist despite non-surgical (conservative) treatment, then surgery may be recommended. MEDICATION  If pain medication is necessary, then nonsteroidal  anti-inflammatory medications, such as aspirin and ibuprofen, or other minor pain relievers, such as acetaminophen, are often recommended.  Do not take pain medication for 7 days before surgery.  Prescription pain relievers may be given if deemed necessary by your caregiver. Use only as directed and only as much as you need.  Ointments applied to the skin may be helpful.  Corticosteroid injections may be given by your caregiver. These injections should be reserved for the most serious cases, because they may only be given a certain number of times. HEAT AND COLD  Cold treatment (icing) relieves pain and reduces inflammation. Cold treatment should be applied for 10 to 15 minutes every 2 to 3 hours for inflammation and pain and immediately after any activity that aggravates your  symptoms. Use ice packs or massage the area with a piece of ice (ice massage).  Heat treatment may be used prior to performing the stretching and strengthening activities prescribed by your caregiver, physical therapist, or athletic trainer. Use a heat pack or soak the injury in warm water. SEEK MEDICAL CARE IF:  Treatment seems to offer no benefit, or the condition worsens.  Any medications produce adverse side effects. EXERCISES  RANGE OF MOTION (ROM) AND STRETCHING EXERCISES - Sciatica Most people with sciatic will find that their symptoms worsen with either excessive bending forward (flexion) or arching at the low back (extension). The exercises which will help resolve your symptoms will focus on the opposite motion. Your physician, physical therapist or athletic trainer will help you determine which exercises will be most helpful to resolve your low back pain. Do not complete any exercises without first consulting with your clinician. Discontinue any exercises which worsen your symptoms until you speak to your clinician. If you have pain, numbness or tingling which travels down into your buttocks, leg or foot, the  goal of the therapy is for these symptoms to move closer to your back and eventually resolve. Occasionally, these leg symptoms will get better, but your low back pain may worsen; this is typically an indication of progress in your rehabilitation. Be certain to be very alert to any changes in your symptoms and the activities in which you participated in the 24 hours prior to the change. Sharing this information with your clinician will allow him/her to most efficiently treat your condition. These exercises may help you when beginning to rehabilitate your injury. Your symptoms may resolve with or without further involvement from your physician, physical therapist or athletic trainer. While completing these exercises, remember:   Restoring tissue flexibility helps normal motion to return to the joints. This allows healthier, less painful movement and activity.  An effective stretch should be held for at least 30 seconds.  A stretch should never be painful. You should only feel a gentle lengthening or release in the stretched tissue. FLEXION RANGE OF MOTION AND STRETCHING EXERCISES: STRETCH - Flexion, Single Knee to Chest   Lie on a firm bed or floor with both legs extended in front of you.  Keeping one leg in contact with the floor, bring your opposite knee to your chest. Hold your leg in place by either grabbing behind your thigh or at your knee.  Pull until you feel a gentle stretch in your low back. Hold __________ seconds.  Slowly release your grasp and repeat the exercise with the opposite side. Repeat __________ times. Complete this exercise __________ times per day.  STRETCH - Flexion, Double Knee to Chest  Lie on a firm bed or floor with both legs extended in front of you.  Keeping one leg in contact with the floor, bring your opposite knee to your chest.  Tense your stomach muscles to support your back and then lift your other knee to your chest. Hold your legs in place by either  grabbing behind your thighs or at your knees.  Pull both knees toward your chest until you feel a gentle stretch in your low back. Hold __________ seconds.  Tense your stomach muscles and slowly return one leg at a time to the floor. Repeat __________ times. Complete this exercise __________ times per day.  STRETCH - Low Trunk Rotation   Lie on a firm bed or floor. Keeping your legs in front of you, bend your knees so they  are both pointed toward the ceiling and your feet are flat on the floor.  Extend your arms out to the side. This will stabilize your upper body by keeping your shoulders in contact with the floor.  Gently and slowly drop both knees together to one side until you feel a gentle stretch in your low back. Hold for __________ seconds.  Tense your stomach muscles to support your low back as you bring your knees back to the starting position. Repeat the exercise to the other side. Repeat __________ times. Complete this exercise __________ times per day  EXTENSION RANGE OF MOTION AND FLEXIBILITY EXERCISES: STRETCH - Extension, Prone on Elbows  Lie on your stomach on the floor, a bed will be too soft. Place your palms about shoulder width apart and at the height of your head.  Place your elbows under your shoulders. If this is too painful, stack pillows under your chest.  Allow your body to relax so that your hips drop lower and make contact more completely with the floor.  Hold this position for __________ seconds.  Slowly return to lying flat on the floor. Repeat __________ times. Complete this exercise __________ times per day.  RANGE OF MOTION - Extension, Prone Press Ups  Lie on your stomach on the floor, a bed will be too soft. Place your palms about shoulder width apart and at the height of your head.  Keeping your back as relaxed as possible, slowly straighten your elbows while keeping your hips on the floor. You may adjust the placement of your hands to maximize  your comfort. As you gain motion, your hands will come more underneath your shoulders.  Hold this position __________ seconds.  Slowly return to lying flat on the floor. Repeat __________ times. Complete this exercise __________ times per day.  STRENGTHENING EXERCISES - Sciatica  These exercises may help you when beginning to rehabilitate your injury. These exercises should be done near your "sweet spot." This is the neutral, low-back arch, somewhere between fully rounded and fully arched, that is your least painful position. When performed in this safe range of motion, these exercises can be used for people who have either a flexion or extension based injury. These exercises may resolve your symptoms with or without further involvement from your physician, physical therapist or athletic trainer. While completing these exercises, remember:   Muscles can gain both the endurance and the strength needed for everyday activities through controlled exercises.  Complete these exercises as instructed by your physician, physical therapist or athletic trainer. Progress with the resistance and repetition exercises only as your caregiver advises.  You may experience muscle soreness or fatigue, but the pain or discomfort you are trying to eliminate should never worsen during these exercises. If this pain does worsen, stop and make certain you are following the directions exactly. If the pain is still present after adjustments, discontinue the exercise until you can discuss the trouble with your clinician. STRENGTHENING - Deep Abdominals, Pelvic Tilt   Lie on a firm bed or floor. Keeping your legs in front of you, bend your knees so they are both pointed toward the ceiling and your feet are flat on the floor.  Tense your lower abdominal muscles to press your low back into the floor. This motion will rotate your pelvis so that your tail bone is scooping upwards rather than pointing at your feet or into the  floor.  With a gentle tension and even breathing, hold this position for __________ seconds.  Repeat __________ times. Complete this exercise __________ times per day.  STRENGTHENING - Abdominals, Crunches   Lie on a firm bed or floor. Keeping your legs in front of you, bend your knees so they are both pointed toward the ceiling and your feet are flat on the floor. Cross your arms over your chest.  Slightly tip your chin down without bending your neck.  Tense your abdominals and slowly lift your trunk high enough to just clear your shoulder blades. Lifting higher can put excessive stress on the low back and does not further strengthen your abdominal muscles.  Control your return to the starting position. Repeat __________ times. Complete this exercise __________ times per day.  STRENGTHENING - Quadruped, Opposite UE/LE Lift  Assume a hands and knees position on a firm surface. Keep your hands under your shoulders and your knees under your hips. You may place padding under your knees for comfort.  Find your neutral spine and gently tense your abdominal muscles so that you can maintain this position. Your shoulders and hips should form a rectangle that is parallel with the floor and is not twisted.  Keeping your trunk steady, lift your right hand no higher than your shoulder and then your left leg no higher than your hip. Make sure you are not holding your breath. Hold this position __________ seconds.  Continuing to keep your abdominal muscles tense and your back steady, slowly return to your starting position. Repeat with the opposite arm and leg. Repeat __________ times. Complete this exercise __________ times per day.  STRENGTHENING - Abdominals and Quadriceps, Straight Leg Raise   Lie on a firm bed or floor with both legs extended in front of you.  Keeping one leg in contact with the floor, bend the other knee so that your foot can rest flat on the floor.  Find your neutral spine, and  tense your abdominal muscles to maintain your spinal position throughout the exercise.  Slowly lift your straight leg off the floor about 6 inches for a count of 15, making sure to not hold your breath.  Still keeping your neutral spine, slowly lower your leg all the way to the floor. Repeat this exercise with each leg __________ times. Complete this exercise __________ times per day. POSTURE AND BODY MECHANICS CONSIDERATIONS - Sciatica Keeping correct posture when sitting, standing or completing your activities will reduce the stress put on different body tissues, allowing injured tissues a chance to heal and limiting painful experiences. The following are general guidelines for improved posture. Your physician or physical therapist will provide you with any instructions specific to your needs. While reading these guidelines, remember:  The exercises prescribed by your provider will help you have the flexibility and strength to maintain correct postures.  The correct posture provides the optimal environment for your joints to work. All of your joints have less wear and tear when properly supported by a spine with good posture. This means you will experience a healthier, less painful body.  Correct posture must be practiced with all of your activities, especially prolonged sitting and standing. Correct posture is as important when doing repetitive low-stress activities (typing) as it is when doing a single heavy-load activity (lifting). RESTING POSITIONS Consider which positions are most painful for you when choosing a resting position. If you have pain with flexion-based activities (sitting, bending, stooping, squatting), choose a position that allows you to rest in a less flexed posture. You would want to avoid curling into a fetal position  on your side. If your pain worsens with extension-based activities (prolonged standing, working overhead), avoid resting in an extended position such as sleeping  on your stomach. Most people will find more comfort when they rest with their spine in a more neutral position, neither too rounded nor too arched. Lying on a non-sagging bed on your side with a pillow between your knees, or on your back with a pillow under your knees will often provide some relief. Keep in mind, being in any one position for a prolonged period of time, no matter how correct your posture, can still lead to stiffness. PROPER SITTING POSTURE In order to minimize stress and discomfort on your spine, you must sit with correct posture Sitting with good posture should be effortless for a healthy body. Returning to good posture is a gradual process. Many people can work toward this most comfortably by using various supports until they have the flexibility and strength to maintain this posture on their own. When sitting with proper posture, your ears will fall over your shoulders and your shoulders will fall over your hips. You should use the back of the chair to support your upper back. Your low back will be in a neutral position, just slightly arched. You may place a small pillow or folded towel at the base of your low back for support.  When working at a desk, create an environment that supports good, upright posture. Without extra support, muscles fatigue and lead to excessive strain on joints and other tissues. Keep these recommendations in mind: CHAIR:   A chair should be able to slide under your desk when your back makes contact with the back of the chair. This allows you to work closely.  The chair's height should allow your eyes to be level with the upper part of your monitor and your hands to be slightly lower than your elbows. BODY POSITION  Your feet should make contact with the floor. If this is not possible, use a foot rest.  Keep your ears over your shoulders. This will reduce stress on your neck and low back. INCORRECT SITTING POSTURES   If you are feeling tired and unable  to assume a healthy sitting posture, do not slouch or slump. This puts excessive strain on your back tissues, causing more damage and pain. Healthier options include:  Using more support, like a lumbar pillow.  Switching tasks to something that requires you to be upright or walking.  Talking a brief walk.  Lying down to rest in a neutral-spine position. PROLONGED STANDING WHILE SLIGHTLY LEANING FORWARD  When completing a task that requires you to lean forward while standing in one place for a long time, place either foot up on a stationary 2-4 inch high object to help maintain the best posture. When both feet are on the ground, the low back tends to lose its slight inward curve. If this curve flattens (or becomes too large), then the back and your other joints will experience too much stress, fatigue more quickly and can cause pain.  CORRECT STANDING POSTURES Proper standing posture should be assumed with all daily activities, even if they only take a few moments, like when brushing your teeth. As in sitting, your ears should fall over your shoulders and your shoulders should fall over your hips. You should keep a slight tension in your abdominal muscles to brace your spine. Your tailbone should point down to the ground, not behind your body, resulting in an over-extended swayback posture.  INCORRECT STANDING POSTURES  Common incorrect standing postures include a forward head, locked knees and/or an excessive swayback. WALKING Walk with an upright posture. Your ears, shoulders and hips should all line-up. PROLONGED ACTIVITY IN A FLEXED POSITION When completing a task that requires you to bend forward at your waist or lean over a low surface, try to find a way to stabilize 3 of 4 of your limbs. You can place a hand or elbow on your thigh or rest a knee on the surface you are reaching across. This will provide you more stability so that your muscles do not fatigue as quickly. By keeping your knees  relaxed, or slightly bent, you will also reduce stress across your low back. CORRECT LIFTING TECHNIQUES DO :   Assume a wide stance. This will provide you more stability and the opportunity to get as close as possible to the object which you are lifting.  Tense your abdominals to brace your spine; then bend at the knees and hips. Keeping your back locked in a neutral-spine position, lift using your leg muscles. Lift with your legs, keeping your back straight.  Test the weight of unknown objects before attempting to lift them.  Try to keep your elbows locked down at your sides in order get the best strength from your shoulders when carrying an object.  Always ask for help when lifting heavy or awkward objects. INCORRECT LIFTING TECHNIQUES DO NOT:   Lock your knees when lifting, even if it is a small object.  Bend and twist. Pivot at your feet or move your feet when needing to change directions.  Assume that you cannot safely pick up a paperclip without proper posture.   This information is not intended to replace advice given to you by your health care provider. Make sure you discuss any questions you have with your health care provider.   Document Released: 11/23/2005 Document Revised: 04/09/2015 Document Reviewed: 03/07/2009 Elsevier Interactive Patient Education [employer].

## 2016-07-21 ENCOUNTER — Encounter: Payer: Self-pay | Admitting: Advanced Practice Midwife

## 2016-07-21 ENCOUNTER — Other Ambulatory Visit: Payer: Commercial Managed Care - HMO

## 2016-07-21 ENCOUNTER — Ambulatory Visit (INDEPENDENT_AMBULATORY_CARE_PROVIDER_SITE_OTHER): Payer: Commercial Managed Care - HMO | Admitting: Advanced Practice Midwife

## 2016-07-21 VITALS — BP 126/72 | HR 105 | Wt 197.0 lb

## 2016-07-21 DIAGNOSIS — Z8759 Personal history of other complications of pregnancy, childbirth and the puerperium: Secondary | ICD-10-CM

## 2016-07-21 DIAGNOSIS — Z369 Encounter for antenatal screening, unspecified: Secondary | ICD-10-CM

## 2016-07-21 DIAGNOSIS — Z131 Encounter for screening for diabetes mellitus: Secondary | ICD-10-CM

## 2016-07-21 DIAGNOSIS — Z1389 Encounter for screening for other disorder: Secondary | ICD-10-CM

## 2016-07-21 DIAGNOSIS — Z3492 Encounter for supervision of normal pregnancy, unspecified, second trimester: Secondary | ICD-10-CM

## 2016-07-21 DIAGNOSIS — Z331 Pregnant state, incidental: Secondary | ICD-10-CM

## 2016-07-21 DIAGNOSIS — R87611 Atypical squamous cells cannot exclude high grade squamous intraepithelial lesion on cytologic smear of cervix (ASC-H): Secondary | ICD-10-CM

## 2016-07-21 LAB — POCT URINALYSIS DIPSTICK
Blood, UA: NEGATIVE
GLUCOSE UA: NEGATIVE
KETONES UA: NEGATIVE
LEUKOCYTES UA: NEGATIVE
Nitrite, UA: NEGATIVE

## 2016-07-21 MED ORDER — BUTALBITAL-APAP-CAFFEINE 50-325-40 MG PO TABS: 1.0000 | ORAL_TABLET | Freq: Four times a day (QID) | ORAL | 0 refills | Status: DC | PRN

## 2016-07-21 NOTE — Progress Notes (Signed)
G2P1001 [redacted]w[redacted]d Estimated Date of Delivery: 10/15/16  Blood pressure 126/72, pulse (!) 105, weight 197 lb (89.4 kg), last menstrual period 12/31/2015, unknown if currently breastfeeding.   BP weight and urine results all reviewed and noted.  Please refer to the obstetrical flow sheet for the fundal height and fetal heart rate documentation:  Patient reports good fetal movement, denies any bleeding and no rupture of membranes symptoms or regular contractions. Patient c/o frontal to back of head HA for a few days. Responded fairly well to accupressure.   All questions were answered.  Orders Placed This Encounter  Procedures  . POCT urinalysis dipstick    Plan:  Continued routine obstetrical care, PN2 today.  Rx fioricet prn  Return in about 3 weeks (around 08/11/2016) for LROB.

## 2016-07-21 NOTE — Progress Notes (Signed)
Pt states that she has had a headache for a couple days and is a little concerned about that.

## 2016-07-21 NOTE — Patient Instructions (Signed)
Third Trimester of Pregnancy The third trimester is from week 29 through week 42, months 7 through 9. The third trimester is a time when the fetus is growing rapidly. At the end of the ninth month, the fetus is about 20 inches in length and weighs 6-10 pounds.  BODY CHANGES Your body goes through many changes during pregnancy. The changes vary from woman to woman.   Your weight will continue to increase. You can expect to gain 25-35 pounds (11-16 kg) by the end of the pregnancy.  You may begin to get stretch marks on your hips, abdomen, and breasts.  You may urinate more often because the fetus is moving lower into your pelvis and pressing on your bladder.  You may develop or continue to have heartburn as a result of your pregnancy.  You may develop constipation because certain hormones are causing the muscles that push waste through your intestines to slow down.  You may develop hemorrhoids or swollen, bulging veins (varicose veins).  You may have pelvic pain because of the weight gain and pregnancy hormones relaxing your joints between the bones in your pelvis. Backaches may result from overexertion of the muscles supporting your posture.  You may have changes in your hair. These can include thickening of your hair, rapid growth, and changes in texture. Some women also have hair loss during or after pregnancy, or hair that feels dry or thin. Your hair will most likely return to normal after your baby is born.  Your breasts will continue to grow and be tender. A yellow discharge may leak from your breasts called colostrum.  Your belly button may stick out.  You may feel short of breath because of your expanding uterus.  You may notice the fetus "dropping," or moving lower in your abdomen.  You may have a bloody mucus discharge. This usually occurs a few days to a week before labor begins.  Your cervix becomes thin and soft (effaced) near your due date. WHAT TO EXPECT AT YOUR PRENATAL  EXAMS  You will have prenatal exams every 2 weeks until week 36. Then, you will have weekly prenatal exams. During a routine prenatal visit:  You will be weighed to make sure you and the fetus are growing normally.  Your blood pressure is taken.  Your abdomen will be measured to track your baby's growth.  The fetal heartbeat will be listened to.  Any test results from the previous visit will be discussed.  You may have a cervical check near your due date to see if you have effaced. At around 36 weeks, your caregiver will check your cervix. At the same time, your caregiver will also perform a test on the secretions of the vaginal tissue. This test is to determine if a type of bacteria, Group B streptococcus, is present. Your caregiver will explain this further. Your caregiver may ask you:  What your birth plan is.  How you are feeling.  If you are feeling the baby move.  If you have had any abnormal symptoms, such as leaking fluid, bleeding, severe headaches, or abdominal cramping.  If you are using any tobacco products, including cigarettes, chewing tobacco, and electronic cigarettes.  If you have any questions. Other tests or screenings that may be performed during your third trimester include:  Blood tests that check for low iron levels (anemia).  Fetal testing to check the health, activity level, and growth of the fetus. Testing is done if you have certain medical conditions or if  there are problems during the pregnancy.  HIV (human immunodeficiency virus) testing. If you are at high risk, you may be screened for HIV during your third trimester of pregnancy. FALSE LABOR You may feel small, irregular contractions that eventually go away. These are called Braxton Hicks contractions, or false labor. Contractions may last for hours, days, or even weeks before true labor sets in. If contractions come at regular intervals, intensify, or become painful, it is best to be seen by your  caregiver.  SIGNS OF LABOR   Menstrual-like cramps.  Contractions that are 5 minutes apart or less.  Contractions that start on the top of the uterus and spread down to the lower abdomen and back.  A sense of increased pelvic pressure or back pain.  A watery or bloody mucus discharge that comes from the vagina. If you have any of these signs before the 37th week of pregnancy, call your caregiver right away. You need to go to the hospital to get checked immediately. HOME CARE INSTRUCTIONS   Avoid all smoking, herbs, alcohol, and unprescribed drugs. These chemicals affect the formation and growth of the baby.  Do not use any tobacco products, including cigarettes, chewing tobacco, and electronic cigarettes. If you need help quitting, ask your health care provider. You may receive counseling support and other resources to help you quit.  Follow your caregiver's instructions regarding medicine use. There are medicines that are either safe or unsafe to take during pregnancy.  Exercise only as directed by your caregiver. Experiencing uterine cramps is a good sign to stop exercising.  Continue to eat regular, healthy meals.  Wear a good support bra for breast tenderness.  Do not use hot tubs, steam rooms, or saunas.  Wear your seat belt at all times when driving.  Avoid raw meat, uncooked cheese, cat litter boxes, and soil used by cats. These carry germs that can cause birth defects in the baby.  Take your prenatal vitamins.  Take 1500-2000 mg of calcium daily starting at the 20th week of pregnancy until you deliver your baby.  Try taking a stool softener (if your caregiver approves) if you develop constipation. Eat more high-fiber foods, such as fresh vegetables or fruit and whole grains. Drink plenty of fluids to keep your urine clear or pale yellow.  Take warm sitz baths to soothe any pain or discomfort caused by hemorrhoids. Use hemorrhoid cream if your caregiver approves.  If  you develop varicose veins, wear support hose. Elevate your feet for 15 minutes, 3-4 times a day. Limit salt in your diet.  Avoid heavy lifting, wear low heal shoes, and practice good posture.  Rest a lot with your legs elevated if you have leg cramps or low back pain.  Visit your dentist if you have not gone during your pregnancy. Use a soft toothbrush to brush your teeth and be gentle when you floss.  A sexual relationship may be continued unless your caregiver directs you otherwise.  Do not travel far distances unless it is absolutely necessary and only with the approval of your caregiver.  Take prenatal classes to understand, practice, and ask questions about the labor and delivery.  Make a trial run to the hospital.  Pack your hospital bag.  Prepare the baby's nursery.  Continue to go to all your prenatal visits as directed by your caregiver. SEEK MEDICAL CARE IF:  You are unsure if you are in labor or if your water has broken.  You have dizziness.  You have  mild pelvic cramps, pelvic pressure, or nagging pain in your abdominal area.  You have persistent nausea, vomiting, or diarrhea.  You have a bad smelling vaginal discharge.  You have pain with urination. SEEK IMMEDIATE MEDICAL CARE IF:   You have a fever.  You are leaking fluid from your vagina.  You have spotting or bleeding from your vagina.  You have severe abdominal cramping or pain.  You have rapid weight loss or gain.  You have shortness of breath with chest pain.  You notice sudden or extreme swelling of your face, hands, ankles, feet, or legs.  You have not felt your baby move in over an hour.  You have severe headaches that do not go away with medicine.  You have vision changes.   This information is not intended to replace advice given to you by your health care provider. Make sure you discuss any questions you have with your health care provider.   Document Released: 11/17/2001 Document  Revised: 12/14/2014 Document Reviewed: 01/24/2013 Elsevier Interactive Patient Education 2016 [employer] Back Pain in Pregnancy Back pain during pregnancy is common. It happens in about half of all pregnancies. It is important for you and your baby that you remain active during your pregnancy.If you feel that back pain is not allowing you to remain active or sleep well, it is time to see your caregiver. Back pain may be caused by several factors related to changes during your pregnancy.Fortunately, unless you had trouble with your back before your pregnancy, the pain is likely to get better after you deliver. Low back pain usually occurs between the fifth and seventh months of pregnancy. It can, however, happen in the first couple months. Factors that increase the risk of back problems include:   Previous back problems.  Injury to your back.  Having twins or multiple births.  A chronic cough.  Stress.  Job-related repetitive motions.  Muscle or spinal disease in the back.  Family history of back problems, ruptured (herniated) discs, or osteoporosis.  Depression, anxiety, and panic attacks. CAUSES   When you are pregnant, your body produces a hormone called relaxin. This hormonemakes the ligaments connecting the low back and pubic bones more flexible. This flexibility allows the baby to be delivered more easily. When your ligaments are loose, your muscles need to work harder to support your back. Soreness in your back can come from tired muscles. Soreness can also come from back tissues that are irritated since they are receiving less support.  As the baby grows, it puts pressure on the nerves and blood vessels in your pelvis. This can cause back pain.  As the baby grows and gets heavier during pregnancy, the uterus pushes the stomach muscles forward and changes your center of gravity. This makes your back muscles work harder to maintain good posture. SYMPTOMS  Lumbar pain during  pregnancy Lumbar pain during pregnancy usually occurs at or above the waist in the center of the back. There may be pain and numbness that radiates into your leg or foot. This is similar to low back pain experienced by non-pregnant women. It usually increases with sitting for long periods of time, standing, or repetitive lifting. Tenderness may also be present in the muscles along your upper back. Posterior pelvic pain during pregnancy Pain in the back of the pelvis is more common than lumbar pain in pregnancy. It is a deep pain felt in your side at the waistline, or across the tailbone (sacrum), or in both places.  You may have pain on one or both sides. This pain can also go into the buttocks and backs of the upper thighs. Pubic and groin pain may also be present. The pain does not quickly resolve with rest, and morning stiffness may also be present. Pelvic pain during pregnancy can be brought on by most activities. A high level of fitness before and during pregnancy may or may not prevent this problem. Labor pain is usually 1 to 2 minutes apart, lasts for about 1 minute, and involves a bearing down feeling or pressure in your pelvis. However, if you are at term with the pregnancy, constant low back pain can be the beginning of early labor, and you should be aware of this. DIAGNOSIS  X-rays of the back should not be done during the first 12 to 14 weeks of the pregnancy and only when absolutely necessary during the rest of the pregnancy. MRIs do not give off radiation and are safe during pregnancy. MRIs also should only be done when absolutely necessary. HOME CARE INSTRUCTIONS  Exercise as directed by your caregiver. Exercise is the most effective way to prevent or manage back pain. If you have a back problem, it is especially important to avoid sports that require sudden body movements. Swimming and walking are great activities.  Do not stand in one place for long periods of time.  Do not wear high  heels.  Sit in chairs with good posture. Use a pillow on your lower back if necessary. Make sure your head rests over your shoulders and is not hanging forward.  Try sleeping on your side, preferably the left side, with a pillow or two between your legs. If you are sore after a night's rest, your bedmay betoo soft.Try placing a board between your mattress and box spring.  Listen to your body when lifting.If you are experiencing pain, ask for help or try bending yourknees more so you can use your leg muscles rather than your back muscles. Squat down when picking up something from the floor. Do not bend over.  Eat a healthy diet. Try to gain weight within your caregiver's recommendations.  Use heat or cold packs 3 to 4 times a day for 15 minutes to help with the pain.  Only take over-the-counter or prescription medicines for pain, discomfort, or fever as directed by your caregiver. Sudden (acute) back pain  Use bed rest for only the most extreme, acute episodes of back pain. Prolonged bed rest over 48 hours will aggravate your condition.  Ice is very effective for acute conditions.  Put ice in a plastic bag.  Place a towel between your skin and the bag.  Leave the ice on for 10 to 20 minutes every 2 hours, or as needed.  Using heat packs for 30 minutes prior to activities is also helpful. Continued back pain See your caregiver if you have continued problems. Your caregiver can help or refer you for appropriate physical therapy. With conditioning, most back problems can be avoided. Sometimes, a more serious issue may be the cause of back pain. You should be seen right away if new problems seem to be developing. Your caregiver may recommend:  A maternity girdle.  An elastic sling.  A back brace.  A massage therapist or acupuncture. SEEK MEDICAL CARE IF:   You are not able to do most of your daily activities, even when taking the pain medicine you were given.  You need a  referral to a physical therapist or chiropractor.  You want to try acupuncture. SEEK IMMEDIATE MEDICAL CARE IF:  You develop numbness, tingling, weakness, or problems with the use of your arms or legs.  You develop severe back pain that is no longer relieved with medicines.  You have a sudden change in bowel or bladder control.  You have increasing pain in other areas of the body.  You develop shortness of breath, dizziness, or fainting.  You develop nausea, vomiting, or sweating.  You have back pain which is similar to labor pains.  You have back pain along with your water breaking or vaginal bleeding.  You have back pain or numbness that travels down your leg.  Your back pain developed after you fell.  You develop pain on one side of your back. You may have a kidney stone.  You see blood in your urine. You may have a bladder infection or kidney stone.  You have back pain with blisters. You may have shingles. Back pain is fairly common during pregnancy but should not be accepted as just part of the process. Back pain should always be treated as soon as possible. This will make your pregnancy as pleasant as possible.   This information is not intended to replace advice given to you by your health care provider. Make sure you discuss any questions you have with your health care provider.   Document Released: 03/03/2006 Document Revised: 02/15/2012 Document Reviewed: 04/14/2011 Elsevier Interactive Patient Education [employer].

## 2016-07-22 ENCOUNTER — Other Ambulatory Visit: Payer: Self-pay | Admitting: Women's Health

## 2016-07-22 LAB — GLUCOSE TOLERANCE, 2 HOURS W/ 1HR
GLUCOSE, 2 HOUR: 98 mg/dL (ref 65–152)
Glucose, 1 hour: 101 mg/dL (ref 65–179)
Glucose, Fasting: 76 mg/dL (ref 65–91)

## 2016-07-22 LAB — RPR: RPR: NONREACTIVE

## 2016-07-22 LAB — CBC
HEMOGLOBIN: 10.3 g/dL — AB (ref 11.1–15.9)
Hematocrit: 32.7 % — ABNORMAL LOW (ref 34.0–46.6)
MCH: 24.1 pg — ABNORMAL LOW (ref 26.6–33.0)
MCHC: 31.5 g/dL (ref 31.5–35.7)
MCV: 76 fL — ABNORMAL LOW (ref 79–97)
PLATELETS: 235 10*3/uL (ref 150–379)
RBC: 4.28 x10E6/uL (ref 3.77–5.28)
RDW: 14.1 % (ref 12.3–15.4)
WBC: 8.9 10*3/uL (ref 3.4–10.8)

## 2016-07-22 LAB — ANTIBODY SCREEN: Antibody Screen: NEGATIVE

## 2016-07-22 LAB — HIV ANTIBODY (ROUTINE TESTING W REFLEX): HIV SCREEN 4TH GENERATION: NONREACTIVE

## 2016-07-22 MED ORDER — FUSION PLUS PO CAPS: 1.0000 | ORAL_CAPSULE | ORAL | 6 refills | Status: DC

## 2016-08-11 ENCOUNTER — Encounter: Payer: Self-pay | Admitting: Obstetrics & Gynecology

## 2016-08-11 ENCOUNTER — Ambulatory Visit (INDEPENDENT_AMBULATORY_CARE_PROVIDER_SITE_OTHER): Payer: Commercial Managed Care - HMO | Admitting: Obstetrics & Gynecology

## 2016-08-11 VITALS — BP 130/80 | HR 84 | Wt 198.0 lb

## 2016-08-11 DIAGNOSIS — Z3493 Encounter for supervision of normal pregnancy, unspecified, third trimester: Secondary | ICD-10-CM

## 2016-08-11 DIAGNOSIS — F32A Depression, unspecified: Secondary | ICD-10-CM

## 2016-08-11 DIAGNOSIS — O99343 Other mental disorders complicating pregnancy, third trimester: Secondary | ICD-10-CM

## 2016-08-11 DIAGNOSIS — Z3483 Encounter for supervision of other normal pregnancy, third trimester: Secondary | ICD-10-CM

## 2016-08-11 DIAGNOSIS — Z3A31 31 weeks gestation of pregnancy: Secondary | ICD-10-CM

## 2016-08-11 DIAGNOSIS — F329 Major depressive disorder, single episode, unspecified: Secondary | ICD-10-CM

## 2016-08-11 DIAGNOSIS — Z331 Pregnant state, incidental: Secondary | ICD-10-CM

## 2016-08-11 DIAGNOSIS — Z1389 Encounter for screening for other disorder: Secondary | ICD-10-CM

## 2016-08-11 LAB — POCT URINALYSIS DIPSTICK
Blood, UA: NEGATIVE
GLUCOSE UA: NEGATIVE
Ketones, UA: NEGATIVE
Leukocytes, UA: NEGATIVE
NITRITE UA: NEGATIVE
Protein, UA: NEGATIVE

## 2016-08-11 MED ORDER — ESCITALOPRAM OXALATE 10 MG PO TABS: 10.0000 mg | ORAL_TABLET | Freq: Every day | ORAL | 3 refills | Status: DC

## 2016-08-11 NOTE — Progress Notes (Signed)
G2P1001 [redacted]w[redacted]d Estimated Date of Delivery: 10/15/16  Blood pressure 130/80, pulse 84, weight 198 lb (89.8 kg), last menstrual period 12/31/2015, unknown if currently breastfeeding.   BP weight and urine results all reviewed and noted.  Please refer to the obstetrical flow sheet for the fundal height and fetal heart rate documentation:  Patient reports good fetal movement, denies any bleeding and no rupture of membranes symptoms or regular contractions. Patient is without complaints. All questions were answered.  Orders Placed This Encounter  Procedures  . POCT urinalysis dipstick    Plan:  Continued routine obstetrical care, see note below  No Follow-up on file.  Psychiatry Consult for Depression  Reason for Consult:   Referring Physician:   Somalia Sackett is an 20 y.o. female  Assessment: Axis I: Depressive Disorder NOS  Plan:  No evidence of imminent risk to self or others at present.   Patient does not meet criteria for psychiatric inpatient admission. Discussed crisis plan, support from social network, calling 911, coming to the Emergency Department, and calling Suicide Hotline.  Subjective: Somalia Toalson is a 20 y.o. female patient consulted for Depression.  Psychiatry is consulted for Depression.  HPI:  Somalia Rehberg is a 20 yo G2P1001 who presents today for routine follow up. Patient denies any vaginal discharge, spotting, pelvic pain. Admits to irregular contractions and is feeling the baby move. When asked about her mood she states she is feeling "meh". Upon further questioning she admits she has been feeling depressed. Denies any suicidal or homicidal ideation at this time. Is concerned that she may develop post partum depression. She has been treated for depression before 4 years ago and received treatment for 7 months. Does not remember the medication name.  Past Psychiatric History: Therapy, Out Patient  high school diploma/GED None Past Medical History:  Diagnosis Date  .  Asthma   . Endometritis 01/11/2015  . GERD (gastroesophageal reflux disease)   . History of ovarian cyst 01/28/2015  . Nausea and vomiting 01/11/2015  . Ovarian cyst 01/14/2015  . Pelvic pain in female 01/11/2015  . Pregnant 02/05/2016   Past Surgical History:  Procedure Laterality Date  . CESAREAN SECTION N/A 09/29/2014   Procedure: CESAREAN SECTION;  Surgeon: Jonnie Kind, MD;  Location: [facility];  Service: Obstetrics;  Laterality: N/A;  . ESOPHAGOGASTRODUODENOSCOPY (EGD) WITH ESOPHAGEAL DILATION N/A 07/19/2013   Dr. Rourk:normal appearing esophagus s/p dilation, normal D1 and D2, unremarkable path   . none    . WISDOM TOOTH EXTRACTION      reports that she has never smoked. She has never used smokeless tobacco. She reports that she does not drink alcohol or use drugs. Family History  Problem Relation Age of Onset  . Multiple sclerosis Mother   . Heart disease Maternal Grandfather   . Heart disease Paternal Uncle   . Diabetes Paternal Uncle   . Hypertension Paternal Uncle   . Cancer Other     father's aunt had breast cancer  . Colon cancer Other     maternal great uncle, age greater than 14   Allergies:   Allergies  Allergen Reactions  . Adhesive [Tape] Other (See Comments)    blisters  . Lorabid [Loracarbef] Hives and Swelling  . Latex Itching and Rash  . Orange Fruit [Citrus] Rash    Objective: Blood pressure 130/80, pulse 84, weight 198 lb (89.8 kg), last menstrual period 12/31/2015, unknown if currently breastfeeding.Body mass index is 36.21 kg/m. Results for orders placed or performed in visit  on 08/11/16 (from the past 72 hour(s))  POCT urinalysis dipstick     Status: None   Collection Time: 08/11/16 10:36 AM  Result Value Ref Range   Color, UA     Clarity, UA     Glucose, UA neg    Bilirubin, UA     Ketones, UA neg    Spec Grav, UA     Blood, UA neg    pH, UA     Protein, UA neg    Urobilinogen, UA     Nitrite, UA neg    Leukocytes, UA Negative Negative    Labs are reviewed and are pertinent for .  Current Outpatient Prescriptions  Medication Sig Dispense Refill  . acetaminophen (TYLENOL) 325 MG tablet Take 650 mg by mouth every 6 (six) hours as needed for headache. Reported on 04/28/2016    . aspirin 81 MG tablet Take 81 mg by mouth daily.    . butalbital-acetaminophen-caffeine (FIORICET) 50-325-40 MG tablet Take 1-2 tablets by mouth every 6 (six) hours as needed for headache. 20 tablet 0  . Iron-FA-B Cmp-C-Biot-Probiotic (FUSION PLUS) CAPS Take 1 capsule by mouth See admin instructions. 1 time daily between meals 30 capsule 6  . Prenatal Vit-Fe Fumarate-FA (PRENATAL MULTIVITAMIN) TABS tablet Take 1 tablet by mouth daily at 12 noon.     No current facility-administered medications for this visit.     Psychiatric Specialty Exam: Physical Exam  ROS  Blood pressure 130/80, pulse 84, weight 198 lb (89.8 kg), last menstrual period 12/31/2015, unknown if currently breastfeeding.Body mass index is 36.21 kg/m.  General Appearance: Neat and Well Groomed  Engineer, water::  Good  Speech:  Clear and Coherent  Volume:  Normal  Mood:  Depressed  Affect:  Depressed and Tearful  Thought Process:  Coherent  Orientation:  Full (Time, Place, and Person)  Thought Content:  Negative  Suicidal Thoughts:  No  Homicidal Thoughts:  No  Memory:  Negative  Judgement:  Good  Insight:  Good  Psychomotor Activity:  Normal  Concentration:  Good  Recall:  Good  Akathisia:  No  Handed:  Right  AIMS (if indicated):     Assets:  Desire for Improvement  Sleep:   difficuty getting to sleep, sleeps late   EURE,LUTHER H 08/11/2016 11:09 AM

## 2016-08-25 ENCOUNTER — Ambulatory Visit (INDEPENDENT_AMBULATORY_CARE_PROVIDER_SITE_OTHER): Payer: Commercial Managed Care - HMO | Admitting: Obstetrics & Gynecology

## 2016-08-25 VITALS — BP 140/68 | HR 86 | Wt 195.0 lb

## 2016-08-25 DIAGNOSIS — O34219 Maternal care for unspecified type scar from previous cesarean delivery: Secondary | ICD-10-CM

## 2016-08-25 DIAGNOSIS — F329 Major depressive disorder, single episode, unspecified: Secondary | ICD-10-CM

## 2016-08-25 DIAGNOSIS — Z3493 Encounter for supervision of normal pregnancy, unspecified, third trimester: Secondary | ICD-10-CM

## 2016-08-25 DIAGNOSIS — Z3A33 33 weeks gestation of pregnancy: Secondary | ICD-10-CM

## 2016-08-25 DIAGNOSIS — F32A Depression, unspecified: Secondary | ICD-10-CM

## 2016-08-25 DIAGNOSIS — O99343 Other mental disorders complicating pregnancy, third trimester: Secondary | ICD-10-CM

## 2016-08-25 DIAGNOSIS — Z1389 Encounter for screening for other disorder: Secondary | ICD-10-CM

## 2016-08-25 DIAGNOSIS — Z331 Pregnant state, incidental: Secondary | ICD-10-CM

## 2016-08-25 DIAGNOSIS — Z3483 Encounter for supervision of other normal pregnancy, third trimester: Secondary | ICD-10-CM

## 2016-08-25 LAB — POCT URINALYSIS DIPSTICK
GLUCOSE UA: NEGATIVE
Ketones, UA: NEGATIVE
NITRITE UA: NEGATIVE
RBC UA: NEGATIVE

## 2016-08-25 NOTE — Progress Notes (Signed)
G2P1001 [redacted]w[redacted]d Estimated Date of Delivery: 10/15/16  Blood pressure 140/68, pulse 86, weight 195 lb (88.5 kg), last menstrual period 12/31/2015, unknown if currently breastfeeding.   BP weight and urine results all reviewed and noted.  Please refer to the obstetrical flow sheet for the fundal height and fetal heart rate documentation:  Patient reports good fetal movement, denies any bleeding and no rupture of membranes symptoms or regular contractions. Patient is without complaints. All questions were answered.  Orders Placed This Encounter  Procedures  . POCT urinalysis dipstick    Plan:  Continued routine obstetrical care, taking baby ASA Doing well on lexapro 10 daily feels much better  Return in about 2 weeks (around 09/08/2016) for [place], with Dr Elonda Husky.

## 2016-09-08 ENCOUNTER — Ambulatory Visit (INDEPENDENT_AMBULATORY_CARE_PROVIDER_SITE_OTHER): Payer: Commercial Managed Care - HMO | Admitting: Obstetrics & Gynecology

## 2016-09-08 ENCOUNTER — Encounter: Payer: Self-pay | Admitting: Obstetrics & Gynecology

## 2016-09-08 VITALS — BP 130/80 | HR 80 | Wt 198.0 lb

## 2016-09-08 DIAGNOSIS — Z1389 Encounter for screening for other disorder: Secondary | ICD-10-CM

## 2016-09-08 DIAGNOSIS — Z331 Pregnant state, incidental: Secondary | ICD-10-CM

## 2016-09-08 DIAGNOSIS — Z3493 Encounter for supervision of normal pregnancy, unspecified, third trimester: Secondary | ICD-10-CM

## 2016-09-08 DIAGNOSIS — O34219 Maternal care for unspecified type scar from previous cesarean delivery: Secondary | ICD-10-CM

## 2016-09-08 LAB — POCT URINALYSIS DIPSTICK
Blood, UA: NEGATIVE
Glucose, UA: NEGATIVE
KETONES UA: NEGATIVE
Nitrite, UA: NEGATIVE
PROTEIN UA: NEGATIVE

## 2016-09-08 NOTE — Progress Notes (Signed)
G2P1001 [redacted]w[redacted]d Estimated Date of Delivery: 10/15/16  Blood pressure 130/80, pulse 80, weight 198 lb (89.8 kg), last menstrual period 12/31/2015, unknown if currently breastfeeding.   BP weight and urine results all reviewed and noted.  Please refer to the obstetrical flow sheet for the fundal height and fetal heart rate documentation:  Patient reports good fetal movement, denies any bleeding and no rupture of membranes symptoms or regular contractions. Patient is without complaints. All questions were answered.  Orders Placed This Encounter  Procedures  . POCT urinalysis dipstick    Plan:  Continued routine obstetrical care, repeat Caesarean section 10/08/2016  No Follow-up on file.

## 2016-09-21 ENCOUNTER — Inpatient Hospital Stay (HOSPITAL_COMMUNITY)
Admission: AD | Admit: 2016-09-21 | Discharge: 2016-09-21 | Disposition: A | Discharge status: Home / Self Care | Payer: Medicaid Other | Source: Ambulatory Visit | Attending: Family Medicine | Admitting: Family Medicine

## 2016-09-21 ENCOUNTER — Encounter (HOSPITAL_COMMUNITY): Payer: Self-pay | Admitting: *Deleted

## 2016-09-21 DIAGNOSIS — Z3A37 37 weeks gestation of pregnancy: Secondary | ICD-10-CM | POA: Insufficient documentation

## 2016-09-21 DIAGNOSIS — Z3493 Encounter for supervision of normal pregnancy, unspecified, third trimester: Secondary | ICD-10-CM | POA: Insufficient documentation

## 2016-09-21 DIAGNOSIS — Z3403 Encounter for supervision of normal first pregnancy, third trimester: Secondary | ICD-10-CM

## 2016-09-21 HISTORY — DX: Irritable bowel syndrome, unspecified: K58.9

## 2016-09-21 LAB — URINE MICROSCOPIC-ADD ON: RBC / HPF: NONE SEEN RBC/hpf (ref 0–5)

## 2016-09-21 LAB — URINALYSIS, ROUTINE W REFLEX MICROSCOPIC
BILIRUBIN URINE: NEGATIVE
GLUCOSE, UA: NEGATIVE mg/dL
HGB URINE DIPSTICK: NEGATIVE
Ketones, ur: NEGATIVE mg/dL
Nitrite: NEGATIVE
PH: 7 (ref 5.0–8.0)
Protein, ur: NEGATIVE mg/dL

## 2016-09-21 NOTE — Discharge Instructions (Signed)
Braxton Hicks Contractions °Contractions of the uterus can occur throughout pregnancy. Contractions are not always a sign that you are in labor.  °WHAT ARE BRAXTON HICKS CONTRACTIONS?  °Contractions that occur before labor are called Braxton Hicks contractions, or false labor. Toward the end of pregnancy (32-34 weeks), these contractions can develop more often and may become more forceful. This is not true labor because these contractions do not result in opening (dilatation) and thinning of the cervix. They are sometimes difficult to tell apart from true labor because these contractions can be forceful and people have different pain tolerances. You should not feel embarrassed if you go to the hospital with false labor. Sometimes, the only way to tell if you are in true labor is for your health care provider to look for changes in the cervix. °If there are no prenatal problems or other health problems associated with the pregnancy, it is completely safe to be sent home with false labor and await the onset of true labor. °HOW CAN YOU TELL THE DIFFERENCE BETWEEN TRUE AND FALSE LABOR? °False Labor °· The contractions of false labor are usually shorter and not as hard as those of true labor.   °· The contractions are usually irregular.   °· The contractions are often felt in the front of the lower abdomen and in the groin.   °· The contractions may go away when you walk around or change positions while lying down.   °· The contractions get weaker and are shorter lasting as time goes on.   °· The contractions do not usually become progressively stronger, regular, and closer together as with true labor.   °True Labor °· Contractions in true labor last 30-70 seconds, become very regular, usually become more intense, and increase in frequency.   °· The contractions do not go away with walking.   °· The discomfort is usually felt in the top of the uterus and spreads to the lower abdomen and low back.   °· True labor can be  determined by your health care provider with an exam. This will show that the cervix is dilating and getting thinner.   °WHAT TO REMEMBER °· Keep up with your usual exercises and follow other instructions given by your health care provider.   °· Take medicines as directed by your health care provider.   °· Keep your regular prenatal appointments.   °· Eat and drink lightly if you think you are going into labor.   °· If Braxton Hicks contractions are making you uncomfortable:   °¨ Change your position from lying down or resting to walking, or from walking to resting.   °¨ Sit and rest in a tub of warm water.   °¨ Drink 2-3 glasses of water. Dehydration may cause these contractions.   °¨ Do slow and deep breathing several times an hour.   °WHEN SHOULD I SEEK IMMEDIATE MEDICAL CARE? °Seek immediate medical care if: °· Your contractions become stronger, more regular, and closer together.   °· You have fluid leaking or gushing from your vagina.   °· You have a fever.   °· You pass blood-tinged mucus.   °· You have vaginal bleeding.   °· You have continuous abdominal pain.   °· You have low back pain that you never had before.   °· You feel your baby's head pushing down and causing pelvic pressure.   °· Your baby is not moving as much as it used to.   °  °This information is not intended to replace advice given to you by your health care provider. Make sure you discuss any questions you have with your health care   provider. °  °Document Released: 11/23/2005 Document Revised: 11/28/2013 Document Reviewed: 09/04/2013 °Elsevier Interactive Patient Education ©2016 Elsevier Inc. ° °

## 2016-09-21 NOTE — MAU Note (Signed)
Pt presents complaining of contractions since last night. States they are every 5-10 minutes. Denies bleeding or leaking. Reports a milky white discharge. Reports good fetal movement.

## 2016-09-23 ENCOUNTER — Ambulatory Visit (INDEPENDENT_AMBULATORY_CARE_PROVIDER_SITE_OTHER): Payer: Commercial Managed Care - HMO | Admitting: Women's Health

## 2016-09-23 ENCOUNTER — Encounter: Payer: Self-pay | Admitting: Women's Health

## 2016-09-23 VITALS — BP 152/96 | HR 84 | Wt 197.0 lb

## 2016-09-23 DIAGNOSIS — Z118 Encounter for screening for other infectious and parasitic diseases: Secondary | ICD-10-CM

## 2016-09-23 DIAGNOSIS — Z1159 Encounter for screening for other viral diseases: Secondary | ICD-10-CM

## 2016-09-23 DIAGNOSIS — O0993 Supervision of high risk pregnancy, unspecified, third trimester: Secondary | ICD-10-CM

## 2016-09-23 DIAGNOSIS — Z331 Pregnant state, incidental: Secondary | ICD-10-CM

## 2016-09-23 DIAGNOSIS — Z1389 Encounter for screening for other disorder: Secondary | ICD-10-CM

## 2016-09-23 DIAGNOSIS — O133 Gestational [pregnancy-induced] hypertension without significant proteinuria, third trimester: Secondary | ICD-10-CM

## 2016-09-23 DIAGNOSIS — Z3685 Encounter for antenatal screening for Streptococcus B: Secondary | ICD-10-CM

## 2016-09-23 DIAGNOSIS — R030 Elevated blood-pressure reading, without diagnosis of hypertension: Secondary | ICD-10-CM

## 2016-09-23 LAB — POCT URINALYSIS DIPSTICK
Blood, UA: NEGATIVE
Glucose, UA: NEGATIVE
Ketones, UA: NEGATIVE
NITRITE UA: NEGATIVE

## 2016-09-23 NOTE — Progress Notes (Signed)
High Risk Pregnancy Diagnosis(es): GHTN dx today G2P1001 [redacted]w[redacted]d Estimated Date of Delivery: 10/15/16 BP (!) 152/96   Pulse 84   Wt 197 lb (89.4 kg)   LMP 12/31/2015   BMI 36.03 kg/m   Urinalysis: Positive for tr leuks, tr protein HPI:  Lots of pressure, low back pain. Has had ha's throughout pregnancy taking fioricet w/ relief- maybe a 'little stronger' lately but fioricet still helps, denies visual changes, epigastric/ruq pain, some n/v the other day. Taking baby asa as directed. For repeat c/s BP, weight, and urine reviewed.  Reports good fm. Denies regular uc's, lof, vb, uti s/s. No complaints.  Fundal Height:  37  Fetal Heart rate:  135, reactive NST/Cat I Edema:  Trace, DTRs 2+, no clonus GBS, gc/ct collected SVE per request: 3.5/th/-2, vtx, posterior  Reviewed labor s/s, fkc, pre-e s/s All questions were answered Assessment: [redacted]w[redacted]d GHTN dx today  Medication(s) Plans:  None for now Treatment Plan:  Pre-e labs today Discussed w/ [facility]- Follow up in 1d for high-risk OB appt w/ [facility], come NPO, possible c/s tomorrow depending on labs, if labs ok- c/s Friday

## 2016-09-23 NOTE — Patient Instructions (Addendum)
Nothing to eat or drink after midnight tonight  Call the office [phone]) or go to [facility] if:  You begin to have strong, frequent contractions  Your water breaks.  Sometimes it is a big gush of fluid, sometimes it is just a trickle that keeps getting your panties wet or running down your legs  You have vaginal bleeding.  It is normal to have a small amount of spotting if your cervix was checked.   You don't feel your baby moving like normal.  If you don't, get you something to eat and drink and lay down and focus on feeling your baby move.  You should feel at least 10 movements in 2 hours.  If you don't, you should call the office or go to [facility].    Call the office [phone]) or go to [facility] hospital for these signs of pre-eclampsia:  Severe headache that does not go away with Tylenol  Visual changes- seeing spots, double, blurred vision  Pain under your right breast or upper abdomen that does not go away with Tums or heartburn medicine  Nausea and/or vomiting  Severe swelling in your hands, feet, and face      Hypertension During Pregnancy Hypertension, or high blood pressure, is when there is extra pressure inside your blood vessels that carry blood from the heart to the rest of your body (arteries). It can happen at any time in life, including pregnancy. Hypertension during pregnancy can cause problems for you and your baby. Your baby might not weigh as much as he or she should at birth or might be born early (premature). Very bad cases of hypertension during pregnancy can be life-threatening.  Different types of hypertension can occur during pregnancy. These include:  Chronic hypertension. This happens when a woman has hypertension before pregnancy and it continues during pregnancy.  Gestational hypertension. This is when hypertension develops during pregnancy.  Preeclampsia or toxemia of pregnancy. This is a very serious type of hypertension that develops  only during pregnancy. It affects the whole body and can be very dangerous for both mother and baby.  Gestational hypertension and preeclampsia usually go away after your baby is born. Your blood pressure will likely stabilize within 6 weeks. Women who have hypertension during pregnancy have a greater chance of developing hypertension later in life or with future pregnancies. RISK FACTORS There are certain factors that make it more likely for you to develop hypertension during pregnancy. These include:  Having hypertension before pregnancy.  Having hypertension during a previous pregnancy.  Being overweight.  Being older than 40 years.  Being pregnant with more than one baby.  Having diabetes or kidney problems. SIGNS AND SYMPTOMS Chronic and gestational hypertension rarely cause symptoms. Preeclampsia has symptoms, which may include:  Increased protein in your urine. Your health care provider will check for this at every prenatal visit.  Swelling of your hands and face.  Rapid weight gain.  Headaches.  Visual changes.  Being bothered by light.  Abdominal pain, especially in the upper right area.  Chest pain.  Shortness of breath.  Increased reflexes.  Seizures. These occur with a more severe form of preeclampsia, called eclampsia. DIAGNOSIS  You may be diagnosed with hypertension during a regular prenatal exam. At each prenatal visit, you may have:  Your blood pressure checked.  A urine test to check for protein in your urine. The type of hypertension you are diagnosed with depends on when you developed it. It also depends on your  specific blood pressure reading.  Developing hypertension before 20 weeks of pregnancy is consistent with chronic hypertension.  Developing hypertension after 20 weeks of pregnancy is consistent with gestational hypertension.  Hypertension with increased urinary protein is diagnosed as preeclampsia.  Blood pressure measurements that  stay above 0000000 systolic or [id number] diastolic are a sign of severe preeclampsia. TREATMENT Treatment for hypertension during pregnancy varies. Treatment depends on the type of hypertension and how serious it is.  If you take medicine for chronic hypertension, you may need to switch medicines.  Medicines called ACE inhibitors should not be taken during pregnancy.  Low-dose aspirin may be suggested for women who have risk factors for preeclampsia.  If you have gestational hypertension, you may need to take a blood pressure medicine that is safe during pregnancy. Your health care provider will recommend the correct medicine.  If you have severe preeclampsia, you may need to be in the hospital. Health care providers will watch you and your baby very closely. You also may need to take medicine called magnesium sulfate to prevent seizures and lower blood pressure.  Sometimes, an early delivery is needed. This may be the case if the condition worsens. It would be done to protect you and your baby. The only cure for preeclampsia is delivery.  Your health care provider may recommend that you take one low-dose aspirin (81 mg) each day to help prevent high blood pressure during your pregnancy if you are at risk for preeclampsia. You may be at risk for preeclampsia if:  You had preeclampsia or eclampsia during a previous pregnancy.  Your baby did not grow as expected during a previous pregnancy.  You experienced preterm birth with a previous pregnancy.  You experienced a separation of the placenta from the uterus (placental abruption) during a previous pregnancy.  You experienced the loss of your baby during a previous pregnancy.  You are pregnant with more than one baby.  You have other medical conditions, such as diabetes or an autoimmune disease. HOME CARE INSTRUCTIONS  Schedule and keep all of your regular prenatal care appointments. This is important.  Take medicines only as directed by your  health care provider. Tell your health care provider about all medicines you take.  Eat as little salt as possible.  Get regular exercise.  Do not drink alcohol.  Do not use tobacco products.  Do not drink products with caffeine.  Lie on your left side when resting. SEEK IMMEDIATE MEDICAL CARE IF:  You have severe abdominal pain.  You have sudden swelling in your hands, ankles, or face.  You gain 4 pounds (1.8 kg) or more in 1 week.  You vomit repeatedly.  You have vaginal bleeding.  You do not feel your baby moving as much.  You have a headache.  You have blurred or double vision.  You have muscle twitching or spasms.  You have shortness of breath.  You have blue fingernails or lips.  You have blood in your urine. MAKE SURE YOU:  Understand these instructions.  Will watch your condition.  Will get help right away if you are not doing well or get worse.   This information is not intended to replace advice given to you by your health care provider. Make sure you discuss any questions you have with your health care provider.   Document Released: 08/11/2011 Document Revised: 12/14/2014 Document Reviewed: 06/22/2013 Elsevier Interactive Patient Education 2016 Elsevier Inc.  [employer] of the uterus can occur throughout pregnancy.  Contractions are not always a sign that you are in labor.  WHAT ARE BRAXTON HICKS CONTRACTIONS?  Contractions that occur before labor are called Braxton Hicks contractions, or false labor. Toward the end of pregnancy (32-34 weeks), these contractions can develop more often and may become more forceful. This is not true labor because these contractions do not result in opening (dilatation) and thinning of the cervix. They are sometimes difficult to tell apart from true labor because these contractions can be forceful and people have different pain tolerances. You should not feel embarrassed if you go to the  hospital with false labor. Sometimes, the only way to tell if you are in true labor is for your health care provider to look for changes in the cervix. If there are no prenatal problems or other health problems associated with the pregnancy, it is completely safe to be sent home with false labor and await the onset of true labor. HOW CAN YOU TELL THE DIFFERENCE BETWEEN TRUE AND FALSE LABOR? False Labor  The contractions of false labor are usually shorter and not as hard as those of true labor.   The contractions are usually irregular.   The contractions are often felt in the front of the lower abdomen and in the groin.   The contractions may go away when you walk around or change positions while lying down.   The contractions get weaker and are shorter lasting as time goes on.   The contractions do not usually become progressively stronger, regular, and closer together as with true labor.  True Labor  Contractions in true labor last 30-70 seconds, become very regular, usually become more intense, and increase in frequency.   The contractions do not go away with walking.   The discomfort is usually felt in the top of the uterus and spreads to the lower abdomen and low back.   True labor can be determined by your health care provider with an exam. This will show that the cervix is dilating and getting thinner.  WHAT TO REMEMBER  Keep up with your usual exercises and follow other instructions given by your health care provider.   Take medicines as directed by your health care provider.   Keep your regular prenatal appointments.   Eat and drink lightly if you think you are going into labor.   If Braxton Hicks contractions are making you uncomfortable:   Change your position from lying down or resting to walking, or from walking to resting.   Sit and rest in a tub of warm water.   Drink 2-3 glasses of water. Dehydration may cause these contractions.   Do slow  and deep breathing several times an hour.  WHEN SHOULD I SEEK IMMEDIATE MEDICAL CARE? Seek immediate medical care if:  Your contractions become stronger, more regular, and closer together.   You have fluid leaking or gushing from your vagina.   You have a fever.   You pass blood-tinged mucus.   You have vaginal bleeding.   You have continuous abdominal pain.   You have low back pain that you never had before.   You feel your baby's head pushing down and causing pelvic pressure.   Your baby is not moving as much as it used to.    This information is not intended to replace advice given to you by your health care provider. Make sure you discuss any questions you have with your health care provider.   Document Released: 11/23/2005 Document Revised: 11/28/2013 Document  Reviewed: 09/04/2013 Elsevier Interactive Patient Education [employer].

## 2016-09-24 ENCOUNTER — Encounter: Payer: Self-pay | Admitting: Obstetrics and Gynecology

## 2016-09-24 ENCOUNTER — Ambulatory Visit (INDEPENDENT_AMBULATORY_CARE_PROVIDER_SITE_OTHER): Payer: Commercial Managed Care - HMO | Admitting: Obstetrics and Gynecology

## 2016-09-24 ENCOUNTER — Encounter (HOSPITAL_COMMUNITY): Payer: Self-pay

## 2016-09-24 VITALS — BP 132/92 | HR 88 | Wt 196.0 lb

## 2016-09-24 DIAGNOSIS — O0993 Supervision of high risk pregnancy, unspecified, third trimester: Secondary | ICD-10-CM

## 2016-09-24 DIAGNOSIS — Z1389 Encounter for screening for other disorder: Secondary | ICD-10-CM

## 2016-09-24 DIAGNOSIS — O133 Gestational [pregnancy-induced] hypertension without significant proteinuria, third trimester: Secondary | ICD-10-CM | POA: Diagnosis not present

## 2016-09-24 DIAGNOSIS — Z3A37 37 weeks gestation of pregnancy: Secondary | ICD-10-CM

## 2016-09-24 DIAGNOSIS — Z331 Pregnant state, incidental: Secondary | ICD-10-CM

## 2016-09-24 LAB — CBC
HEMATOCRIT: 30.8 % — AB (ref 34.0–46.6)
HEMOGLOBIN: 9.7 g/dL — AB (ref 11.1–15.9)
MCH: 21 pg — ABNORMAL LOW (ref 26.6–33.0)
MCHC: 31.5 g/dL (ref 31.5–35.7)
MCV: 67 fL — ABNORMAL LOW (ref 79–97)
Platelets: 249 10*3/uL (ref 150–379)
RBC: 4.61 x10E6/uL (ref 3.77–5.28)
RDW: 16.6 % — ABNORMAL HIGH (ref 12.3–15.4)
WBC: 7.1 10*3/uL (ref 3.4–10.8)

## 2016-09-24 LAB — COMPREHENSIVE METABOLIC PANEL
ALBUMIN: 3.5 g/dL (ref 3.5–5.5)
ALT: 8 IU/L (ref 0–32)
AST: 12 IU/L (ref 0–40)
Albumin/Globulin Ratio: 1.2 (ref 1.2–2.2)
Alkaline Phosphatase: 151 IU/L — ABNORMAL HIGH (ref 39–117)
BUN / CREAT RATIO: 5 — AB (ref 9–23)
BUN: 3 mg/dL — ABNORMAL LOW (ref 6–20)
Bilirubin Total: 0.7 mg/dL (ref 0.0–1.2)
CALCIUM: 8.8 mg/dL (ref 8.7–10.2)
CO2: 19 mmol/L (ref 18–29)
CREATININE: 0.57 mg/dL (ref 0.57–1.00)
Chloride: 100 mmol/L (ref 96–106)
GFR calc Af Amer: 152 mL/min/{1.73_m2} (ref >59)
GFR, EST NON AFRICAN AMERICAN: 132 mL/min/{1.73_m2} (ref >59)
GLOBULIN, TOTAL: 3 g/dL (ref 1.5–4.5)
Glucose: 81 mg/dL (ref 65–99)
Potassium: 3.9 mmol/L (ref 3.5–5.2)
SODIUM: 137 mmol/L (ref 134–144)
TOTAL PROTEIN: 6.5 g/dL (ref 6.0–8.5)

## 2016-09-24 LAB — PROTEIN / CREATININE RATIO, URINE
Creatinine, Urine: 146.8 mg/dL
PROTEIN UR: 24.3 mg/dL
Protein/Creat Ratio: 166 mg/g creat (ref 0–200)

## 2016-09-24 LAB — POCT URINALYSIS DIPSTICK
GLUCOSE UA: NEGATIVE
Ketones, UA: NEGATIVE
NITRITE UA: NEGATIVE
RBC UA: NEGATIVE

## 2016-09-24 NOTE — Progress Notes (Signed)
High Risk Pregnancy Diagnosis(es):  Gestational HTN  G2P1001 [redacted]w[redacted]d Estimated Date of Delivery: 10/15/16  Blood pressure (!) 132/92, pulse 88, weight 196 lb (88.9 kg), last menstrual period 12/31/2015, unknown if currently breastfeeding.  Urinalysis: Positive for Moderate 2+ Leukocytes, otherwise negative   HPI: Melissa Livingston is a 20 y.o. female who presents today for a follow up from her OB visit yesterday. Pt states that she was dx with gestational HTN on her OB visit yesterday. Pt notes that she is having associated symptoms of intermittent HA and contractions. Mother states that the pt contractions are between 5-8 minutes and the pt was seen at [facility] for evaluation of her symptoms 3 days ago and informed that she was 3 cm dilated. Mother states that the pt is having a C-section for this pregnancy due to an issue with a vaginal delivery with her prior pregnancy. Pt denies any other symptoms. Pt notes that her contraceptive measures following this pregnancy is the nexplanon.    BP weight and urine results all reviewed and noted. Patient reports good fetal movement, denies any bleeding and no rupture of membranes symptoms or regular contractions.  Fundal Height:  37 cm Fetal Heart rate:  140 in RLQ Edema:    Patient is without complaints. All questions were answered.  Assessment:  [redacted]w[redacted]d, Gestational HTN Medication(s) Plans: Treatment Plan: Plan to move up her repeat C-section due to gestational HTN for tomorrow, 09/25/2016 at 3 PM with pre-op labs at 66 PM at [facility].  Follow up in 1 week for post-op check   By signing my name below, I, Soijett Blue, attest that this documentation has been prepared under the direction and in the presence of Jonnie Kind, MD. Electronically Signed: Soijett Blue, ED Scribe. 09/24/16. 9:18 AM.    I personally performed the services described in this documentation, which was SCRIBED in my presence. The recorded information has been reviewed and  considered accurate. It has been edited as necessary during review. Jonnie Kind, MD

## 2016-09-25 ENCOUNTER — Encounter (HOSPITAL_COMMUNITY): Payer: Self-pay | Admitting: *Deleted

## 2016-09-25 ENCOUNTER — Inpatient Hospital Stay (HOSPITAL_COMMUNITY): Payer: Commercial Managed Care - HMO | Admitting: Anesthesiology

## 2016-09-25 ENCOUNTER — Encounter (HOSPITAL_COMMUNITY)
Admission: RE | Disposition: A | Discharge status: Home / Self Care | Payer: Self-pay | Source: Ambulatory Visit | Attending: Obstetrics and Gynecology

## 2016-09-25 ENCOUNTER — Inpatient Hospital Stay (HOSPITAL_COMMUNITY)
Admission: RE | Admit: 2016-09-25 | Discharge: 2016-09-27 | DRG: 766 | Disposition: A | Discharge status: Home / Self Care | Payer: Commercial Managed Care - HMO | Source: Ambulatory Visit | Attending: Obstetrics and Gynecology | Admitting: Obstetrics and Gynecology

## 2016-09-25 DIAGNOSIS — Z833 Family history of diabetes mellitus: Secondary | ICD-10-CM | POA: Diagnosis not present

## 2016-09-25 DIAGNOSIS — O34211 Maternal care for low transverse scar from previous cesarean delivery: Secondary | ICD-10-CM | POA: Diagnosis present

## 2016-09-25 DIAGNOSIS — O134 Gestational [pregnancy-induced] hypertension without significant proteinuria, complicating childbirth: Secondary | ICD-10-CM | POA: Diagnosis present

## 2016-09-25 DIAGNOSIS — O139 Gestational [pregnancy-induced] hypertension without significant proteinuria, unspecified trimester: Secondary | ICD-10-CM | POA: Diagnosis present

## 2016-09-25 DIAGNOSIS — K219 Gastro-esophageal reflux disease without esophagitis: Secondary | ICD-10-CM | POA: Diagnosis present

## 2016-09-25 DIAGNOSIS — Z8249 Family history of ischemic heart disease and other diseases of the circulatory system: Secondary | ICD-10-CM

## 2016-09-25 DIAGNOSIS — Z3A37 37 weeks gestation of pregnancy: Secondary | ICD-10-CM | POA: Diagnosis not present

## 2016-09-25 DIAGNOSIS — O0990 Supervision of high risk pregnancy, unspecified, unspecified trimester: Secondary | ICD-10-CM

## 2016-09-25 DIAGNOSIS — O9962 Diseases of the digestive system complicating childbirth: Secondary | ICD-10-CM | POA: Diagnosis not present

## 2016-09-25 LAB — CBC
HEMATOCRIT: 32 % — AB (ref 36.0–46.0)
HEMOGLOBIN: 10 g/dL — AB (ref 12.0–15.0)
MCH: 21.1 pg — ABNORMAL LOW (ref 26.0–34.0)
MCHC: 31.3 g/dL (ref 30.0–36.0)
MCV: 67.7 fL — ABNORMAL LOW (ref 78.0–100.0)
Platelets: 266 10*3/uL (ref 150–400)
RBC: 4.73 MIL/uL (ref 3.87–5.11)
RDW: 16.7 % — ABNORMAL HIGH (ref 11.5–15.5)
WBC: 9.7 10*3/uL (ref 4.0–10.5)

## 2016-09-25 LAB — PREPARE RBC (CROSSMATCH)

## 2016-09-25 LAB — GC/CHLAMYDIA PROBE AMP
CHLAMYDIA, DNA PROBE: NEGATIVE
Neisseria gonorrhoeae by PCR: NEGATIVE

## 2016-09-25 LAB — STREP GP B NAA: Strep Gp B NAA: NEGATIVE

## 2016-09-25 SURGERY — Surgical Case
Anesthesia: Spinal

## 2016-09-25 MED ORDER — LACTATED RINGERS IV SOLN
INTRAVENOUS | Status: DC | PRN
  Administered 2016-09-25: 16:00:00 via INTRAVENOUS

## 2016-09-25 MED ORDER — MORPHINE SULFATE-NACL 0.5-0.9 MG/ML-% IV SOSY
PREFILLED_SYRINGE | INTRAVENOUS | Status: AC
  Filled 2016-09-25: qty 1

## 2016-09-25 MED ORDER — IBUPROFEN 600 MG PO TABS
600.0000 mg | ORAL_TABLET | Freq: Four times a day (QID) | ORAL | Status: DC
  Administered 2016-09-26 – 2016-09-27 (×4): 600 mg via ORAL
  Filled 2016-09-25 (×5): qty 1

## 2016-09-25 MED ORDER — KETOROLAC TROMETHAMINE 30 MG/ML IJ SOLN
INTRAMUSCULAR | Status: AC
  Filled 2016-09-25: qty 1

## 2016-09-25 MED ORDER — DIPHENHYDRAMINE HCL 50 MG/ML IJ SOLN
12.5000 mg | INTRAMUSCULAR | Status: DC | PRN
  Administered 2016-09-25: 50 mg via INTRAVENOUS
  Filled 2016-09-25: qty 1

## 2016-09-25 MED ORDER — SCOPOLAMINE 1 MG/3DAYS TD PT72
MEDICATED_PATCH | TRANSDERMAL | Status: DC
Start: 2016-09-25 — End: 2016-09-25
  Filled 2016-09-25: qty 1

## 2016-09-25 MED ORDER — DIBUCAINE 1 % RE OINT: 1.0000 "application " | TOPICAL_OINTMENT | RECTAL | Status: DC | PRN

## 2016-09-25 MED ORDER — COCONUT OIL OIL: 1.0000 "application " | TOPICAL_OIL | Status: DC | PRN

## 2016-09-25 MED ORDER — NALOXONE HCL 2 MG/2ML IJ SOSY: 1.0000 ug/kg/h | PREFILLED_SYRINGE | INTRAVENOUS | Status: DC | PRN

## 2016-09-25 MED ORDER — LACTATED RINGERS IV SOLN
INTRAVENOUS | Status: DC
  Administered 2016-09-25: 125 mL/h via INTRAVENOUS
  Administered 2016-09-25 (×2): via INTRAVENOUS

## 2016-09-25 MED ORDER — GENTAMICIN SULFATE 40 MG/ML IJ SOLN
Freq: Once | INTRAVENOUS | Status: AC
  Administered 2016-09-25: 114.5 mL via INTRAVENOUS
  Filled 2016-09-25: qty 8.5

## 2016-09-25 MED ORDER — SIMETHICONE 80 MG PO CHEW: 80.0000 mg | CHEWABLE_TABLET | ORAL | Status: DC | PRN

## 2016-09-25 MED ORDER — SODIUM CHLORIDE 0.9 % IR SOLN
Status: DC | PRN
  Administered 2016-09-25: 1000 mL

## 2016-09-25 MED ORDER — DEXAMETHASONE SODIUM PHOSPHATE 4 MG/ML IJ SOLN
INTRAMUSCULAR | Status: DC | PRN
  Administered 2016-09-25: 4 mg via INTRAVENOUS

## 2016-09-25 MED ORDER — SIMETHICONE 80 MG PO CHEW
80.0000 mg | CHEWABLE_TABLET | ORAL | Status: DC
  Administered 2016-09-27: 80 mg via ORAL
  Filled 2016-09-25: qty 1

## 2016-09-25 MED ORDER — SODIUM CHLORIDE 0.9% FLUSH: 3.0000 mL | INTRAVENOUS | Status: DC | PRN

## 2016-09-25 MED ORDER — METOCLOPRAMIDE HCL 5 MG/ML IJ SOLN
INTRAMUSCULAR | Status: AC
  Filled 2016-09-25: qty 2

## 2016-09-25 MED ORDER — MEPERIDINE HCL 25 MG/ML IJ SOLN: 6.2500 mg | INTRAMUSCULAR | Status: DC | PRN

## 2016-09-25 MED ORDER — FENTANYL CITRATE (PF) 100 MCG/2ML IJ SOLN
INTRAMUSCULAR | Status: DC | PRN
  Administered 2016-09-25: 20 ug via INTRATHECAL
  Administered 2016-09-25: 80 ug via INTRAVENOUS

## 2016-09-25 MED ORDER — OXYTOCIN 10 UNIT/ML IJ SOLN
INTRAMUSCULAR | Status: AC
  Filled 2016-09-25: qty 4

## 2016-09-25 MED ORDER — NALBUPHINE HCL 10 MG/ML IJ SOLN: 5.0000 mg | INTRAMUSCULAR | Status: DC | PRN

## 2016-09-25 MED ORDER — ONDANSETRON HCL 4 MG/2ML IJ SOLN
4.0000 mg | Freq: Three times a day (TID) | INTRAMUSCULAR | Status: DC | PRN
  Administered 2016-09-25: 4 mg via INTRAVENOUS
  Filled 2016-09-25: qty 2

## 2016-09-25 MED ORDER — PROMETHAZINE HCL 25 MG/ML IJ SOLN: 6.2500 mg | INTRAMUSCULAR | Status: DC | PRN

## 2016-09-25 MED ORDER — OXYCODONE HCL 5 MG PO TABS
5.0000 mg | ORAL_TABLET | ORAL | Status: DC | PRN
  Administered 2016-09-26 – 2016-09-27 (×6): 5 mg via ORAL
  Filled 2016-09-25 (×6): qty 1

## 2016-09-25 MED ORDER — ACETAMINOPHEN 500 MG PO TABS
1000.0000 mg | ORAL_TABLET | Freq: Four times a day (QID) | ORAL | Status: AC
  Administered 2016-09-26: 1000 mg via ORAL
  Filled 2016-09-25: qty 2

## 2016-09-25 MED ORDER — METOCLOPRAMIDE HCL 5 MG/ML IJ SOLN
INTRAMUSCULAR | Status: DC | PRN
  Administered 2016-09-25 (×2): 5 mg via INTRAVENOUS

## 2016-09-25 MED ORDER — PHENYLEPHRINE 8 MG IN D5W 100 ML (0.08MG/ML) PREMIX OPTIME
INJECTION | INTRAVENOUS | Status: AC
  Filled 2016-09-25: qty 100

## 2016-09-25 MED ORDER — DIPHENHYDRAMINE HCL 25 MG PO CAPS
25.0000 mg | ORAL_CAPSULE | Freq: Four times a day (QID) | ORAL | Status: DC | PRN
  Administered 2016-09-26: 25 mg via ORAL
  Filled 2016-09-25 (×2): qty 1

## 2016-09-25 MED ORDER — ONDANSETRON HCL 4 MG/2ML IJ SOLN
INTRAMUSCULAR | Status: AC
  Filled 2016-09-25: qty 2

## 2016-09-25 MED ORDER — SENNOSIDES-DOCUSATE SODIUM 8.6-50 MG PO TABS
2.0000 | ORAL_TABLET | ORAL | Status: DC
  Administered 2016-09-27: 2 via ORAL
  Filled 2016-09-25: qty 2

## 2016-09-25 MED ORDER — ESCITALOPRAM OXALATE 10 MG PO TABS
10.0000 mg | ORAL_TABLET | Freq: Every day | ORAL | Status: DC
  Administered 2016-09-26 – 2016-09-27 (×2): 10 mg via ORAL
  Filled 2016-09-25 (×2): qty 1

## 2016-09-25 MED ORDER — TETANUS-DIPHTH-ACELL PERTUSSIS 5-2.5-18.5 LF-MCG/0.5 IM SUSP
0.5000 mL | Freq: Once | INTRAMUSCULAR | Status: AC
  Administered 2016-09-26: 0.5 mL via INTRAMUSCULAR
  Filled 2016-09-25: qty 0.5

## 2016-09-25 MED ORDER — OXYTOCIN 10 UNIT/ML IJ SOLN
INTRAVENOUS | Status: DC | PRN
  Administered 2016-09-25: 40 [IU] via INTRAVENOUS

## 2016-09-25 MED ORDER — IBUPROFEN 600 MG PO TABS: 600.0000 mg | ORAL_TABLET | Freq: Four times a day (QID) | ORAL | Status: DC | PRN

## 2016-09-25 MED ORDER — NALOXONE HCL 0.4 MG/ML IJ SOLN: 0.4000 mg | INTRAMUSCULAR | Status: DC | PRN

## 2016-09-25 MED ORDER — NALBUPHINE HCL 10 MG/ML IJ SOLN
5.0000 mg | Freq: Once | INTRAMUSCULAR | Status: AC | PRN
  Administered 2016-09-25: 5 mg via INTRAVENOUS
  Filled 2016-09-25: qty 1

## 2016-09-25 MED ORDER — DIPHENHYDRAMINE HCL 25 MG PO CAPS
25.0000 mg | ORAL_CAPSULE | ORAL | Status: DC | PRN
Start: 2016-09-25 — End: 2016-09-27
  Administered 2016-09-26: 25 mg via ORAL

## 2016-09-25 MED ORDER — PHENYLEPHRINE HCL 10 MG/ML IJ SOLN
INTRAMUSCULAR | Status: DC | PRN
  Administered 2016-09-25: 40 ug via INTRAVENOUS

## 2016-09-25 MED ORDER — LACTATED RINGERS IV SOLN
INTRAVENOUS | Status: DC
  Administered 2016-09-25: 20:00:00 via INTRAVENOUS

## 2016-09-25 MED ORDER — WITCH HAZEL-GLYCERIN EX PADS: 1.0000 "application " | MEDICATED_PAD | CUTANEOUS | Status: DC | PRN

## 2016-09-25 MED ORDER — KETOROLAC TROMETHAMINE 30 MG/ML IJ SOLN
30.0000 mg | Freq: Four times a day (QID) | INTRAMUSCULAR | Status: AC | PRN
  Administered 2016-09-26: 30 mg via INTRAVENOUS
  Filled 2016-09-25: qty 1

## 2016-09-25 MED ORDER — SIMETHICONE 80 MG PO CHEW
80.0000 mg | CHEWABLE_TABLET | Freq: Three times a day (TID) | ORAL | Status: DC
  Administered 2016-09-26 – 2016-09-27 (×4): 80 mg via ORAL
  Filled 2016-09-25 (×4): qty 1

## 2016-09-25 MED ORDER — SCOPOLAMINE 1 MG/3DAYS TD PT72
MEDICATED_PATCH | TRANSDERMAL | Status: AC
  Administered 2016-09-25: 1.5 mg via TRANSDERMAL
  Filled 2016-09-25: qty 1

## 2016-09-25 MED ORDER — FENTANYL CITRATE (PF) 100 MCG/2ML IJ SOLN
INTRAMUSCULAR | Status: AC
  Filled 2016-09-25: qty 2

## 2016-09-25 MED ORDER — SCOPOLAMINE 1 MG/3DAYS TD PT72
1.0000 | MEDICATED_PATCH | Freq: Once | TRANSDERMAL | Status: DC
  Administered 2016-09-25: 1.5 mg via TRANSDERMAL

## 2016-09-25 MED ORDER — KETOROLAC TROMETHAMINE 30 MG/ML IJ SOLN: 30.0000 mg | Freq: Once | INTRAMUSCULAR | Status: DC

## 2016-09-25 MED ORDER — OXYCODONE HCL 5 MG PO TABS: 10.0000 mg | ORAL_TABLET | ORAL | Status: DC | PRN

## 2016-09-25 MED ORDER — SCOPOLAMINE 1 MG/3DAYS TD PT72: 1.0000 | MEDICATED_PATCH | Freq: Once | TRANSDERMAL | Status: DC

## 2016-09-25 MED ORDER — ACETAMINOPHEN 325 MG PO TABS: 650.0000 mg | ORAL_TABLET | ORAL | Status: DC | PRN

## 2016-09-25 MED ORDER — PRENATAL MULTIVITAMIN CH
1.0000 | ORAL_TABLET | Freq: Every day | ORAL | Status: DC
  Administered 2016-09-26: 1 via ORAL
  Filled 2016-09-25: qty 1

## 2016-09-25 MED ORDER — BUPIVACAINE IN DEXTROSE 0.75-8.25 % IT SOLN
INTRATHECAL | Status: DC | PRN
Start: 2016-09-25 — End: 2016-09-25
  Administered 2016-09-25: 1.5 mL via INTRATHECAL

## 2016-09-25 MED ORDER — DIPHENHYDRAMINE HCL 50 MG/ML IJ SOLN
INTRAMUSCULAR | Status: DC | PRN
  Administered 2016-09-25: 6.25 mg via INTRAVENOUS

## 2016-09-25 MED ORDER — NALBUPHINE HCL 10 MG/ML IJ SOLN: 5.0000 mg | Freq: Once | INTRAMUSCULAR | Status: AC | PRN

## 2016-09-25 MED ORDER — MORPHINE SULFATE (PF) 0.5 MG/ML IJ SOLN
INTRAMUSCULAR | Status: DC | PRN
  Administered 2016-09-25: .2 mg via INTRATHECAL

## 2016-09-25 MED ORDER — KETOROLAC TROMETHAMINE 30 MG/ML IJ SOLN
30.0000 mg | Freq: Four times a day (QID) | INTRAMUSCULAR | Status: AC | PRN
  Administered 2016-09-25: 30 mg via INTRAMUSCULAR

## 2016-09-25 MED ORDER — ONDANSETRON HCL 4 MG/2ML IJ SOLN
INTRAMUSCULAR | Status: DC | PRN
  Administered 2016-09-25: 4 mg via INTRAVENOUS

## 2016-09-25 MED ORDER — HYDROMORPHONE HCL 1 MG/ML IJ SOLN: 0.2500 mg | INTRAMUSCULAR | Status: DC | PRN

## 2016-09-25 MED ORDER — PHENYLEPHRINE 8 MG IN D5W 100 ML (0.08MG/ML) PREMIX OPTIME
INJECTION | INTRAVENOUS | Status: DC | PRN
  Administered 2016-09-25: 60 ug/min via INTRAVENOUS

## 2016-09-25 MED ORDER — ZOLPIDEM TARTRATE 5 MG PO TABS: 5.0000 mg | ORAL_TABLET | Freq: Every evening | ORAL | Status: DC | PRN

## 2016-09-25 MED ORDER — LACTATED RINGERS IV SOLN
Freq: Once | INTRAVENOUS | Status: AC
  Administered 2016-09-25: 13:00:00 via INTRAVENOUS

## 2016-09-25 MED ORDER — MENTHOL 3 MG MT LOZG: 1.0000 | LOZENGE | OROMUCOSAL | Status: DC | PRN

## 2016-09-25 MED ORDER — OXYTOCIN 40 UNITS IN LACTATED RINGERS INFUSION - SIMPLE MED: 2.5000 [IU]/h | INTRAVENOUS | Status: AC

## 2016-09-25 SURGICAL SUPPLY — 37 items
APL SKNCLS STERI-STRIP NONHPOA (GAUZE/BANDAGES/DRESSINGS) ×1
BENZOIN TINCTURE PRP APPL 2/3 (GAUZE/BANDAGES/DRESSINGS) ×1 IMPLANT
CHLORAPREP W/TINT 26ML (MISCELLANEOUS) ×2 IMPLANT
CLAMP CORD UMBIL (MISCELLANEOUS) IMPLANT
CLOTH BEACON ORANGE TIMEOUT ST (SAFETY) ×2 IMPLANT
DRSG OPSITE POSTOP 4X10 (GAUZE/BANDAGES/DRESSINGS) ×2 IMPLANT
ELECT REM PT RETURN 9FT ADLT (ELECTROSURGICAL) ×2
ELECTRODE REM PT RTRN 9FT ADLT (ELECTROSURGICAL) ×1 IMPLANT
EXTRACTOR VACUUM KIWI (MISCELLANEOUS) IMPLANT
GLOVE BIO SURGEON ST LM GN SZ9 (GLOVE) ×2 IMPLANT
GLOVE BIOGEL PI IND STRL 7.0 (GLOVE) ×1 IMPLANT
GLOVE BIOGEL PI IND STRL 9 (GLOVE) ×1 IMPLANT
GLOVE BIOGEL PI INDICATOR 7.0 (GLOVE) ×1
GLOVE BIOGEL PI INDICATOR 9 (GLOVE) ×1
GOWN STRL REUS W/TWL 2XL LVL3 (GOWN DISPOSABLE) ×2 IMPLANT
GOWN STRL REUS W/TWL LRG LVL3 (GOWN DISPOSABLE) ×2 IMPLANT
NDL HYPO 25X5/8 SAFETYGLIDE (NEEDLE) IMPLANT
NEEDLE HYPO 25X5/8 SAFETYGLIDE (NEEDLE) IMPLANT
NS IRRIG 1000ML POUR BTL (IV SOLUTION) ×2 IMPLANT
PACK C SECTION WH (CUSTOM PROCEDURE TRAY) ×2 IMPLANT
PAD ABD 7.5X8 STRL (GAUZE/BANDAGES/DRESSINGS) ×1 IMPLANT
PAD OB MATERNITY 4.3X12.25 (PERSONAL CARE ITEMS) ×2 IMPLANT
PENCIL SMOKE EVAC W/HOLSTER (ELECTROSURGICAL) ×2 IMPLANT
RTRCTR C-SECT PINK 25CM LRG (MISCELLANEOUS) IMPLANT
RTRCTR C-SECT PINK 34CM XLRG (MISCELLANEOUS) IMPLANT
SPONGE GAUZE 4X4 12PLY STER LF (GAUZE/BANDAGES/DRESSINGS) ×1 IMPLANT
STRIP CLOSURE SKIN 1/2X4 (GAUZE/BANDAGES/DRESSINGS) ×1 IMPLANT
SUT MNCRL 0 VIOLET CTX 36 (SUTURE) ×2 IMPLANT
SUT MONOCRYL 0 CTX 36 (SUTURE) ×2
SUT VIC AB 0 CT1 27 (SUTURE) ×2
SUT VIC AB 0 CT1 27XBRD ANBCTR (SUTURE) ×1 IMPLANT
SUT VIC AB 2-0 CT1 27 (SUTURE) ×4
SUT VIC AB 2-0 CT1 TAPERPNT 27 (SUTURE) ×1 IMPLANT
SUT VIC AB 4-0 KS 27 (SUTURE) ×2 IMPLANT
SYR BULB IRRIGATION 50ML (SYRINGE) IMPLANT
TOWEL OR 17X24 6PK STRL BLUE (TOWEL DISPOSABLE) ×2 IMPLANT
TRAY FOLEY CATH SILVER 14FR (SET/KITS/TRAYS/PACK) ×2 IMPLANT

## 2016-09-25 NOTE — H&P (Signed)
Melissa Livingston is a 20 y.o. female G2P1001  presenting for repeat cesarean section, at 49 + weeks due to gestational hypertension.. BP's have been up for the past week, with normal pih labs. She has had persistent headaches, and contractions off and on over the past week, with cervix 3 cm at last MAU visit.  Pelvis is considered snug, and pt is NOT interested in TOLAC OB History    Gravida Para Term Preterm AB Living   2 1 1     1    SAB TAB Ectopic Multiple Live Births           1     Past Medical History:  Diagnosis Date  . Asthma   . Endometritis 01/11/2015  . GERD (gastroesophageal reflux disease)   . History of ovarian cyst 01/28/2015  . IBS (irritable bowel syndrome)   . Nausea and vomiting 01/11/2015  . Ovarian cyst 01/14/2015  . Pelvic pain in female 01/11/2015  . Pregnancy induced hypertension   . Pregnant 02/05/2016   Past Surgical History:  Procedure Laterality Date  . CESAREAN SECTION N/A 09/29/2014   Procedure: CESAREAN SECTION;  Surgeon: Jonnie Kind, MD;  Location: [facility];  Service: Obstetrics;  Laterality: N/A;  . ESOPHAGOGASTRODUODENOSCOPY (EGD) WITH ESOPHAGEAL DILATION N/A 07/19/2013   Dr. Rourk:normal appearing esophagus s/p dilation, normal D1 and D2, unremarkable path   . none    . WISDOM TOOTH EXTRACTION     Family History: family history includes Cancer in her other; Colon cancer in her other; Diabetes in her paternal uncle; Heart disease in her maternal grandfather and paternal uncle; Hypertension in her paternal uncle; Multiple sclerosis in her mother. Social History:  reports that she has never smoked. She has never used smokeless tobacco. She reports that she does not drink alcohol or use drugs.     Maternal Diabetes: No Genetic Screening: Normal Maternal Ultrasounds/Referrals: Normal Fetal Ultrasounds or other Referrals:  None Maternal Substance Abuse:  No Significant Maternal Medications:  None Significant Maternal Lab Results:  None Other Comments:   None  Review of Systems  Eyes: Negative for blurred vision, double vision and photophobia.  Cardiovascular: Negative.   Neurological: Positive for headaches.   History   Blood pressure 136/75, pulse 95, temperature 98.3 F (36.8 C), temperature source Oral, resp. rate 18, last menstrual period 12/31/2015, SpO2 100 %, unknown if currently breastfeeding. Exam Physical Exam  Constitutional: She is oriented to person, place, and time. She appears well-developed and well-nourished.  HENT:  Head: Normocephalic.  Eyes: Pupils are equal, round, and reactive to light.  Neck: Neck supple.  Cardiovascular: Normal rate.   Respiratory: Effort normal.  GI: Soft.  Gravid uterus, c/w dates.  pfannensteil incision  Musculoskeletal: Normal range of motion.  Neurological: She is alert and oriented to person, place, and time.  Skin: Skin is warm and dry.  Psychiatric: She has a normal mood and affect. Her behavior is normal. Thought content normal.    Prenatal labs: ABO, Rh: --/--/O POS (10/20 1250) Antibody: NEG (10/20 1250) Rubella: 1.85 (03/29 1631) RPR: Non Reactive (08/15 0904)  HBsAg: Negative (03/29 1631)  HIV: Non Reactive (08/15 0904)  GBS: Negative (10/18 1200)   Assessment/Plan: Pregnancy 37+ wk, prior cesarean, not for tolac.  Gestational HTN   Repeat cesarean  Melissa Livingston 09/25/2016, 3:09 PM

## 2016-09-25 NOTE — Anesthesia Procedure Notes (Signed)
Spinal  Patient location during procedure: OR Start time: 09/25/2016 3:38 PM End time: 09/25/2016 3:41 PM Staffing Anesthesiologist: Lyn Hollingshead Performed: anesthesiologist  Preanesthetic Checklist Completed: patient identified, surgical consent, pre-op evaluation, timeout performed, IV checked, risks and benefits discussed and monitors and equipment checked Spinal Block Patient position: sitting Prep: site prepped and draped and DuraPrep Patient monitoring: heart rate, cardiac monitor, continuous pulse ox and blood pressure Approach: midline Location: L3-4 Injection technique: single-shot Needle Needle type: Sprotte  Needle gauge: 24 G Needle length: 9 cm Needle insertion depth: 6 cm Assessment Sensory level: T4

## 2016-09-25 NOTE — Op Note (Signed)
Cesarean Section Operative Report  Melissa Livingston  09/25/2016  Indications: Repeat cesarean, gestational hypertension   Pre-operative Diagnosis: repeat cesearean section, gestational hypertension.   Post-operative Diagnosis: Same   Surgeon: Surgeon(s) and Role:    * Jonnie Kind, MD - Primary    * Isaias Sakai, DO - Fellow   Attending Attestation: I was present and scrubbed for the entire procedure.   Assistants: Katherine Basset, DO  Anesthesia: spinal    Estimated Blood Loss: 750 ml  Total IV Fluids: 3700 ml LR  Urine Output:: 300 ml clear yellow urine  Specimens: None  Findings: Viable female infant in cephalic presentation; Apgars 8/9; weight pending, skin to skin; clear amniotic fluid; intact placenta with three vessel cord; normal uterus, fallopian tubes and ovaries bilaterally.  Baby condition / location:  Couplet care / Skin to Skin   Complications: no complications  Indications: Melissa Livingston is a 20 y.o. G2P1001 with an IUP [redacted]w[redacted]d presenting for repeat cesarean, 2/2 gestational hypertension.  The risks, benefits, complications, treatment options, and exected outcomes were discussed with the patient . The patient dwith the proposed plan, giving informed consent. identified as [name] and the procedure verified as C-Section Delivery.  Procedure Details:  The patient was taken back to the operative suite where spinal anesthesia was placed.  A time out was held and the above information confirmed.   After induction of anesthesia, the patient was draped and prepped in the usual sterile manner and placed in a dorsal supine position with a leftward tilt. A Pfannenstiel incision was made and carried down through the subcutaneous tissue to the fascia. Fascial incision was made and extended transversely. The fascia was separated from the underlying rectus tissue superiorly and inferiorly. The peritoneum was identified and bluntly entered and extended  longitudinally. Alexis retractor was placed. A low transverse uterine incision was made and extended bluntly. Delivered from cephalic presentation was a viable infant with Apgars and weight as above.  After waiting 60 seconds for delayed cord cutting, the umbilical cord was clamped and cut cord blood was obtained for evaluation. Cord ph was not sent. The placenta was removed Intact and appeared normal. The uterine outline, tubes and ovaries appeared normal. The uterine incision was closed with running locked sutures of 0Monocryl with an imbricating layer of the same.   Hemostasis was observed. The peritoneum was closed with 2-0 Plain gut. The rectus muscles were examined and hemostasis observed. The fascia was then reapproximated with running sutures of 0Vicryl. The subcuticular closure was performed using 2-0plain gut. The skin was closed with 4-0Vicryl on a Keith needle.   Instrument, sponge, and needle counts were correct prior the abdominal closure and were correct at the conclusion of the case.     Disposition: PACU - hemodynamically stable.   Maternal Condition: stable       SignedRobert Bellow 09/25/2016 5:25 PM

## 2016-09-25 NOTE — Anesthesia Preprocedure Evaluation (Signed)
Anesthesia Evaluation  Patient identified by MRN, date of birth, ID band Patient awake    Reviewed: Allergy & Precautions, H&P , NPO status , Patient's Chart, lab work & pertinent test results  Airway Mallampati: II  TM Distance: >3 FB Neck ROM: full    Dental no notable dental hx.    Pulmonary    Pulmonary exam normal        Cardiovascular hypertension, negative cardio ROS Normal cardiovascular exam     Neuro/Psych negative neurological ROS     GI/Hepatic Neg liver ROS,   Endo/Other  negative endocrine ROS  Renal/GU      Musculoskeletal   Abdominal (+) + obese,   Peds  Hematology   Anesthesia Other Findings   Reproductive/Obstetrics (+) Pregnancy                             Anesthesia Physical Anesthesia Plan  ASA: II  Anesthesia Plan: Spinal   Post-op Pain Management:    Induction:   Airway Management Planned:   Additional Equipment:   Intra-op Plan:   Post-operative Plan:   Informed Consent: I have reviewed the patients History and Physical, chart, labs and discussed the procedure including the risks, benefits and alternatives for the proposed anesthesia with the patient or authorized representative who has indicated his/her understanding and acceptance.     Plan Discussed with:   Anesthesia Plan Comments:         Anesthesia Quick Evaluation

## 2016-09-25 NOTE — Lactation Note (Signed)
This note was copied from a baby's chart. Lactation Consultation Note  Patient Name: Melissa Livingston Today's Date: 09/25/2016 Reason for consult: Initial assessment   Initial consult with exp BF mom of 1 hour old infant. Infant born by c/s and RN reports infant nuzzled in the OR. Infant STS with mom and dad in PACU. After weight infant was placed to breast. She latched easily to right breast in laid back position. Infant latched easily with flanged lips, rhythmic sucking and intermittent swallows. Infant fed for 7 minutes and self detached. Infant burped and then placed to left breast in laid back cross cradle hold. She latched on and off for about 20 minutes and then was placed STS on mom's chest.   Mom with large compressible breasts and areola. Nipples are semi flat and evert with stimulation. Colostrum easy to express. Showed mom how to hand express and 1 large gtt colostrum noted, fed to infant on finger, not enough to show parents spoon feeding. Enc mom to hand express prior to each feeding and after BF, advised mom to spoon feed infant any EBM.   Discussed with parents that infant is a Early Term Infant by 1 day and that she may act like a term infant and may act like a late preterm infant. LPT infant policy Handout given and explained to parents. Asked them to follow LPT policy and offer any EBM that is expressed to infant. Will reevaluate plan tomorrow. Advised mom that she can begin pumping if she desires today and if infant sleepy tomorrow I would advise that she begin pumping to stimulate milk production.   Feeding log given and asked parents to maintain, BF Resources Handout and [place] given, mom informed of IP/OP Services, BF Support Groups and [place] phone #. Follow up tomorrow and prn.    Maternal Data Formula Feeding for Exclusion: No Has patient been taught Hand Expression?: Yes Does the patient have breastfeeding experience prior to this delivery?: Yes  Feeding Feeding Type:  Breast Fed Length of feed: 25 min  LATCH Score/Interventions Latch: Grasps breast easily, tongue down, lips flanged, rhythmical sucking.  Audible Swallowing: A few with stimulation Intervention(s): Alternate breast massage;Hand expression;Skin to skin  Type of Nipple: Everted at rest and after stimulation  Comfort (Breast/Nipple): Soft / non-tender     Hold (Positioning): Assistance needed to correctly position infant at breast and maintain latch. Intervention(s): Breastfeeding basics reviewed;Support Pillows;Position options;Skin to skin  LATCH Score: 8  Lactation Tools Discussed/Used WIC Program: Yes   Consult Status Consult Status: Follow-up Date: 09/26/16 Follow-up type: In-patient    Melissa Livingston Melissa Livingston 09/25/2016, 5:58 PM

## 2016-09-25 NOTE — Transfer of Care (Signed)
Immediate Anesthesia Transfer of Care Note  Patient: Melissa Livingston  Procedure(s) Performed: Procedure(s): CESAREAN SECTION (N/A)  Patient Location: PACU  Anesthesia Type:Spinal  Level of Consciousness: awake, alert  and oriented  Airway & Oxygen Therapy: Patient Spontanous Breathing  Post-op Assessment: Report given to RN and Post -op Vital signs reviewed and stable  Post vital signs: Reviewed and stable  Last Vitals:  Vitals:   09/25/16 1254 09/25/16 1720  BP: 136/75 129/83  Pulse: 95 90  Resp: 18 (!) 22  Temp: 36.8 C 36.3 C    Last Pain:  Vitals:   09/25/16 1720  TempSrc: Oral  PainSc:       Patients Stated Pain Goal: 4 ([id number])  Complications: No apparent anesthesia complications

## 2016-09-26 ENCOUNTER — Encounter (HOSPITAL_COMMUNITY): Payer: Self-pay | Admitting: Obstetrics and Gynecology

## 2016-09-26 LAB — CBC
HEMATOCRIT: 27.4 % — AB (ref 36.0–46.0)
HEMOGLOBIN: 8.6 g/dL — AB (ref 12.0–15.0)
MCH: 21.1 pg — ABNORMAL LOW (ref 26.0–34.0)
MCHC: 31.4 g/dL (ref 30.0–36.0)
MCV: 67.2 fL — ABNORMAL LOW (ref 78.0–100.0)
Platelets: 246 10*3/uL (ref 150–400)
RBC: 4.08 MIL/uL (ref 3.87–5.11)
RDW: 16.7 % — ABNORMAL HIGH (ref 11.5–15.5)
WBC: 13.9 10*3/uL — ABNORMAL HIGH (ref 4.0–10.5)

## 2016-09-26 LAB — RPR: RPR: NONREACTIVE

## 2016-09-26 MED ORDER — INFLUENZA VAC SPLIT QUAD 0.5 ML IM SUSY
0.5000 mL | PREFILLED_SYRINGE | INTRAMUSCULAR | Status: AC
  Administered 2016-09-27: 0.5 mL via INTRAMUSCULAR
  Filled 2016-09-26: qty 0.5

## 2016-09-26 NOTE — Lactation Note (Signed)
This note was copied from a baby's chart. Lactation Consultation Note  Patient Name: Girl Rosalea Poff Today's Date: 09/26/2016 Reason for consult: Follow-up assessment Baby at 29 hr of life. Baby was not cueing but mom requested latch help. Baby sucked about 3 times before falling asleep. Mom stated that she would like to pump to feed if baby does not start latching. Reviewed LPT infant behavior. Mom stated that she prefers that hand pump but it is hard to use, gave [place]. Mom will offer the breast 8+/24hr on demand, if baby does not latch she will pump and offer expressed milk/formula per guidelines. If baby does latch she will post pump and offer her expressed milk per guidelines. She is aware of lactation services and support group. She will call as needed.   Maternal Data    Feeding Feeding Type: Breast Fed Nipple Type: Regular Length of feed: 0 min  LATCH Score/Interventions Latch: Too sleepy or reluctant, no latch achieved, no sucking elicited.                    Lactation Tools Discussed/Used     Consult Status Consult Status: Follow-up Date: 09/27/16 Follow-up type: In-patient    Denzil Hughes 09/26/2016, 9:25 PM

## 2016-09-26 NOTE — Progress Notes (Signed)
Pt. OOB ambulating well with assist from Rn. Pt. Denies dizziness/lightheadedness. Light lochia. IV saline locked and foley catheter d/ced, pt. Due to void. Instructed to void in measuring hat x 2 and to call RN with ambulation to void, pt. Verbalized understanding.

## 2016-09-26 NOTE — Progress Notes (Signed)
POSTPARTUM PROGRESS NOTE  Post Operative Day 1 Subjective:  Melissa Livingston is a 20 y.o. [id number] [redacted]w[redacted]d s/p RLTCS.  No acute events overnight.  Pt denies problems with ambulating, voiding or po intake.  She denies nausea or vomiting.  Pain is moderately controlled.  She has had flatus. She has not had bowel movement.  Lochia Small.   Objective: Blood pressure (!) 121/58, pulse 79, temperature 98 F (36.7 C), temperature source Oral, resp. rate 18, last menstrual period 12/31/2015, SpO2 96 %, unknown if currently breastfeeding.  Physical Exam:  General: alert, cooperative and no distress Lochia:normal flow Chest: no respiratory distress Heart:regular rate, distal pulses intact Incision: pressure dressing c/d/i Abdomen: soft, nontender,  Uterine Fundus: firm, appropriately tender DVT Evaluation: No calf swelling or tenderness Extremities: trace edema   Recent Labs  09/25/16 1250 09/26/16 0510  HGB 10.0* 8.6*  HCT 32.0* 27.4*    Assessment/Plan:  ASSESSMENT: Melissa Livingston is a 20 y.o. [id number] [redacted]w[redacted]d s/p RLTCS for gHTN  All BP have been wnl since surgery.  Plan for discharge tomorrow and Contraception Nexplanon   LOS: 1 day   Brayton Mars 09/26/2016, 7:40 AM

## 2016-09-26 NOTE — Progress Notes (Signed)
CSW aware of consult. This Probation officer attempted to meet with MOB at bedside to complete assessment; however, MOB had several visitors and did not want to complete assessment with them present. MOB verbalized she will not be d/c today. CSW will meet with MOB tomorrow morning at bedside to complete assessment.   Jynesis Nakamura, MSW, LCSW-A Clinical Social Worker  [facility]  Office: [phone]

## 2016-09-26 NOTE — Anesthesia Postprocedure Evaluation (Signed)
Anesthesia Post Note  Patient: Hydrographic surveyor) Performed: Procedure(s) (LRB): CESAREAN SECTION (N/A)  Patient location during evaluation: Mother Baby Anesthesia Type: Spinal Level of consciousness: awake, awake and alert, oriented and patient cooperative Pain management: pain level controlled Vital Signs Assessment: post-procedure vital signs reviewed and stable Respiratory status: spontaneous breathing, nonlabored ventilation and respiratory function stable Cardiovascular status: stable Postop Assessment: patient able to bend at knees, no headache, no backache and no signs of nausea or vomiting Anesthetic complications: no     Last Vitals:  Vitals:   09/26/16 0200 09/26/16 0555  BP: 127/60 (!) 121/58  Pulse: 82 79  Resp: 18 18  Temp: 36.7 C 36.7 C    Last Pain:  Vitals:   09/26/16 0555  TempSrc: Oral  PainSc: 3    Pain Goal: Patients Stated Pain Goal: 3 (09/26/16 0430)               Tanya Crothers L

## 2016-09-27 MED ORDER — OXYCODONE-ACETAMINOPHEN 5-325 MG PO TABS: 1.0000 | ORAL_TABLET | Freq: Four times a day (QID) | ORAL | Status: DC | PRN

## 2016-09-27 MED ORDER — OXYCODONE HCL 5 MG PO TABS: 5.0000 mg | ORAL_TABLET | ORAL | 0 refills | Status: DC | PRN

## 2016-09-27 NOTE — Discharge Summary (Signed)
OB Discharge Summary     Patient Name: Melissa Livingston DOB: [birth year] MRN: [id number]  Date of admission: 09/25/2016 Delivering MD: Jonnie Kind   Date of discharge: 09/27/2016  Admitting diagnosis: CESAREAN SECTION Intrauterine pregnancy: [redacted]w[redacted]d    Gestational hypertension, prior cesarean not for [place] Secondary diagnosis:  Active Problems:   Gestational hypertension  Additional problems:      Discharge diagnosis: Term Pregnancy Delivered, Gestational Hypertension and prior cesarean not for tolac= repeat cesarean section                                                                                                Post partum procedures:  Augmentation:   Complications: None  Hospital course:  Sceduled C/S   20 y.o. yo G2P2002 at [redacted]w[redacted]d was admitted to the hospital 09/25/2016 for scheduled cesarean section with the following indication:Elective Repeat and gestational hypertension.  Membrane Rupture Time/Date: 4:11 PM ,09/25/2016   Patient delivered a Viable infant.09/25/2016  Details of operation can be found in separate operative note.  Pateint had an uncomplicated postpartum course.  She is ambulating, tolerating a regular diet, passing flatus, and urinating well. Patient is discharged home in stable condition on  09/27/16          Physical exam Vitals:   09/26/16 0830 09/26/16 1230 09/26/16 1700 09/27/16 0500  BP: 121/66 124/66 133/66 126/61  Pulse: 80 76 71 73  Resp: 18 18 16 18   Temp: 98.2 F (36.8 C) 97.8 F (36.6 C) 98.2 F (36.8 C) 98 F (36.7 C)  TempSrc: Oral Oral Oral Axillary  SpO2: 97% 98% 99%    General: no distress Lochia: appropriate Uterine Fundus: firm Incision: Healing well with no significant drainage DVT Evaluation: No evidence of DVT seen on physical exam. No cords or calf tenderness. Labs: Lab Results  Component Value Date   WBC 13.9 (H) 09/26/2016   HGB 8.6 (L) 09/26/2016   HCT 27.4 (L) 09/26/2016   MCV 67.2 (L) 09/26/2016   PLT 246  09/26/2016   CBC Latest Ref Rng & Units 09/26/2016 09/25/2016 09/23/2016  WBC 4.0 - 10.5 K/uL 13.9(H) 9.7 7.1  Hemoglobin 12.0 - 15.0 g/dL 8.6(L) 10.0(L) -  Hematocrit 36.0 - 46.0 % 27.4(L) 32.0(L) 30.8(L)  Platelets 150 - 400 K/uL 246 266 249    CMP Latest Ref Rng & Units 09/23/2016  Glucose 65 - 99 mg/dL 81  BUN 6 - 20 mg/dL 3(L)  Creatinine 0.57 - 1.00 mg/dL 0.57  Sodium 134 - 144 mmol/L 137  Potassium 3.5 - 5.2 mmol/L 3.9  Chloride 96 - 106 mmol/L 100  CO2 18 - 29 mmol/L 19  Calcium 8.7 - 10.2 mg/dL 8.8  Total Protein 6.0 - 8.5 g/dL 6.5  Total Bilirubin 0.0 - 1.2 mg/dL 0.7  Alkaline Phos 39 - 117 IU/L 151(H)  AST 0 - 40 IU/L 12  ALT 0 - 32 IU/L 8    Discharge instruction: per After Visit Summary and "Baby and Me Booklet".  After visit meds: percocet, vits lexapro  Diet: routine diet  Activity: Advance as tolerated. Pelvic rest for 6 weeks.  Outpatient follow up:6 weeks Follow up Appt:Future Appointments Date Time Provider [place]  10/01/2016 10:30 AM Christin Fudge, CNM [facility]   Follow up Visit:No Follow-up on file.  Postpartum contraception: Nexplanon  Newborn Data: Live born female  Birth Weight: 6 lb 7.7 oz (2940 g) APGAR: 8, 9  Baby Feeding: Breast Disposition:home with mother   09/27/2016 Jonnie Kind, MD

## 2016-09-29 LAB — TYPE AND SCREEN
ABO/RH(D): O POS
Antibody Screen: NEGATIVE
UNIT DIVISION: 0
UNIT DIVISION: 0

## 2016-10-01 ENCOUNTER — Encounter: Payer: Commercial Managed Care - HMO | Admitting: Advanced Practice Midwife

## 2016-10-02 ENCOUNTER — Ambulatory Visit (INDEPENDENT_AMBULATORY_CARE_PROVIDER_SITE_OTHER): Payer: Commercial Managed Care - HMO | Admitting: Obstetrics and Gynecology

## 2016-10-02 ENCOUNTER — Encounter: Payer: Self-pay | Admitting: Obstetrics and Gynecology

## 2016-10-02 VITALS — BP 130/78 | HR 76 | Wt 182.4 lb

## 2016-10-02 DIAGNOSIS — Z98891 History of uterine scar from previous surgery: Secondary | ICD-10-CM | POA: Diagnosis not present

## 2016-10-02 DIAGNOSIS — Z9889 Other specified postprocedural states: Secondary | ICD-10-CM | POA: Diagnosis not present

## 2016-10-02 DIAGNOSIS — O133 Gestational [pregnancy-induced] hypertension without significant proteinuria, third trimester: Secondary | ICD-10-CM

## 2016-10-02 NOTE — Progress Notes (Signed)
Patient ID: [name], female   DOB: [birth year], 20 y.o.   MRN: [id number]  Subjective:  Melissa Livingston is a 20 y.o. female now 1 weeks status post C-section. Pt has not tried any medications for the relief of her symptoms. Pt denies any symptoms at this time.   Review of Systems Negative except   Diet:    Bowel movements : normal.  The patient is not having any pain.  Objective:  BP 130/78 (BP Location: Right Arm, Patient Position: Sitting, Cuff Size: Normal)   Pulse 76   Wt 182 lb 6.4 oz (82.7 kg)   Breastfeeding? Yes   BMI 32.31 kg/m  General:Well developed, well nourished.  No acute distress. Abdomen: Bowel sounds normal, soft, non-tender. Pelvic Exam:    External Genitalia:  Normal.    Vagina: Normal    Cervix: Normal    Uterus: Normal    Adnexa/Bimanual: Normal  Incision(s):   Healing well, no drainage, no erythema, no hernia, no swelling, no dehiscence  Assessment:  Post-Op 1 weeks s/p C-section   Doing well postoperatively.   Plan:  1.Wound care discussed  2. . current medications. 3. Activity restrictions: desk job only, no bending, stooping, or squatting and no lifting more than 15 pounds 4. return to work: not applicable. 5. Follow up in 4 weeks. For postpartum visit  By signing my name below, I, Soijett Blue, attest that this documentation has been prepared under the direction and in the presence of Jonnie Kind, MD. Electronically Signed: Soijett Blue, ED Scribe. 10/02/16. 12:27 PM.  I personally performed the services described in this documentation, which was SCRIBED in my presence. The recorded information has been reviewed and considered accurate. It has been edited as necessary during review. Jonnie Kind, MD

## 2016-10-07 ENCOUNTER — Encounter: Payer: Commercial Managed Care - HMO | Admitting: Obstetrics and Gynecology

## 2016-10-07 ENCOUNTER — Encounter (HOSPITAL_COMMUNITY)
Admission: RE | Admit: 2016-10-07 | Discharge: 2016-10-07 | Disposition: A | Discharge status: Home / Self Care | Payer: Commercial Managed Care - HMO | Source: Ambulatory Visit | Attending: Family Medicine | Admitting: Family Medicine

## 2016-10-07 HISTORY — DX: Gestational (pregnancy-induced) hypertension without significant proteinuria, unspecified trimester: O13.9

## 2016-10-08 ENCOUNTER — Inpatient Hospital Stay (HOSPITAL_COMMUNITY)
Admission: RE | Admit: 2016-10-08 | Payer: Commercial Managed Care - HMO | Source: Ambulatory Visit | Admitting: Obstetrics & Gynecology

## 2016-10-08 ENCOUNTER — Encounter (HOSPITAL_COMMUNITY): Admission: RE | Payer: Self-pay | Source: Ambulatory Visit

## 2016-10-08 SURGERY — Surgical Case
Anesthesia: Regional

## 2016-10-14 ENCOUNTER — Telehealth: Payer: Self-pay | Admitting: Obstetrics and Gynecology

## 2016-10-14 NOTE — Telephone Encounter (Signed)
Pt states she had her baby 10/20 and is still having some bleeding, had stopped for a few days then started again.  Pt denies pain or other issues, she is breast feeding and bottle feeding.  Informed her this all sounds normal.

## 2016-11-02 ENCOUNTER — Encounter: Payer: Self-pay | Admitting: Women's Health

## 2016-11-02 ENCOUNTER — Ambulatory Visit (INDEPENDENT_AMBULATORY_CARE_PROVIDER_SITE_OTHER): Payer: Commercial Managed Care - HMO | Admitting: Women's Health

## 2016-11-02 DIAGNOSIS — R51 Headache: Secondary | ICD-10-CM

## 2016-11-02 DIAGNOSIS — Z8759 Personal history of other complications of pregnancy, childbirth and the puerperium: Secondary | ICD-10-CM

## 2016-11-02 DIAGNOSIS — R519 Headache, unspecified: Secondary | ICD-10-CM

## 2016-11-02 NOTE — Patient Instructions (Signed)
For Headaches:   Stay well hydrated, drink enough water so that your urine is clear, sometimes if you are dehydrated you can get headaches  Eat small frequent meals and snacks, sometimes if you are hungry you can get headaches  Sometimes you get headaches during pregnancy from the pregnancy hormones  You can try tylenol (1-2 regular strength 325mg  or 1-2 extra strength 500mg ) as directed on the box. The least amount of medication that works is best.   Cool compresses (cool wet washcloth or ice pack) to area of head that is hurting  You can also try drinking a caffeinated drink to see if this will help  Make sure you are using good posture  Try to get enough sleep, if possible  If not helping, try below:  For Prevention of Headaches/Migraines:  CoQ10 100mg  three times daily  Vitamin B2 400mg  daily  Magnesium Oxide 400-600mg  daily  If You Get a Bad Headache/Migraine:  Benadryl 25mg    Magnesium Oxide  1 large Gatorade  2 extra strength Tylenol (1,000mg  total)  1 cup coffee or Coke  If this doesn't help please call us @ [phone]

## 2016-11-02 NOTE — Progress Notes (Signed)
Subjective:    Melissa Livingston is a 20 y.o. G16P2002 African American female who presents for a postpartum visit. She is 5 weeks postpartum following a repeat cesarean section, low transverse incision at 37.1 gestational weeks d/t GHTN. Anesthesia: spinal. I have fully reviewed the prenatal and intrapartum course. Postpartum course has been complicated by headaches- almost daily, Rt side of face and into neck and down into back. Feels like she's eating and drinking enough. Not getting much sleep w/ baby and not using good posture. Usually just tries to sleep it off, ibuprofen and apap don't help a lot.  Baby's course has been uncomplicated. Baby is feeding by bottle. Bleeding no bleeding. Bowel function is normal. Bladder function is normal. Patient is not sexually active. Last sexual activity: prior to birth of baby. Contraception method is has appt for nexplanon tomorrow. Postpartum depression screening: negative. Score 0.  Last pap June 2017 and was normal.  The following portions of the patient's history were reviewed and updated as appropriate: allergies, current medications, past medical history, past surgical history and problem list.  Review of Systems Pertinent items are noted in HPI.   Vitals:   11/02/16 0920  BP: 126/68  Pulse: 68  Weight: 182 lb (82.6 kg)   No LMP recorded.  Objective:   General:  alert, cooperative and no distress   Breasts:  deferred, no complaints  Lungs: clear to auscultation bilaterally  Heart:  regular rate and rhythm  Abdomen: soft, nontender, c/s incision well-healed   Vulva: normal  Vagina: normal vagina  Cervix:  closed  Corpus: Well-involuted  Adnexa:  Non-palpable  Rectal Exam: No hemorrhoids        Assessment:   Postpartum exam 5 wks s/p RLTCS Bottlefeeding Headaches Depression screening Contraception counseling   Plan:  Discussed and gave printed info on headache prevention/relief, let us or pcp know if not helping Contraception:  abstinence until nexplanon placement tomorrow Follow up in: 1 day for nexplanon insertion, or earlier if needed, then pap in June d/t h/o abnormal pap  Tawnya Crook CNM, [facility] 11/02/2016 9:44 AM

## 2016-11-03 ENCOUNTER — Encounter: Payer: Self-pay | Admitting: Adult Health

## 2016-11-03 ENCOUNTER — Ambulatory Visit (INDEPENDENT_AMBULATORY_CARE_PROVIDER_SITE_OTHER): Payer: Commercial Managed Care - HMO | Admitting: Adult Health

## 2016-11-03 VITALS — BP 122/70 | HR 96 | Ht 63.0 in | Wt 182.0 lb

## 2016-11-03 DIAGNOSIS — Z3049 Encounter for surveillance of other contraceptives: Secondary | ICD-10-CM

## 2016-11-03 DIAGNOSIS — Z30017 Encounter for initial prescription of implantable subdermal contraceptive: Secondary | ICD-10-CM | POA: Diagnosis not present

## 2016-11-03 DIAGNOSIS — M62838 Other muscle spasm: Secondary | ICD-10-CM | POA: Diagnosis not present

## 2016-11-03 DIAGNOSIS — Z01419 Encounter for gynecological examination (general) (routine) without abnormal findings: Secondary | ICD-10-CM | POA: Insufficient documentation

## 2016-11-03 MED ORDER — CYCLOBENZAPRINE HCL 10 MG PO TABS: 10.0000 mg | ORAL_TABLET | Freq: Three times a day (TID) | ORAL | 1 refills | Status: DC | PRN

## 2016-11-03 NOTE — Progress Notes (Signed)
Subjective:     Patient ID: [name], female   DOB: [birth year], 20 y.o.   MRN: [id number]  HPI Melissa Livingston is a 20 year old black female in for nexplanon insertion and says back hurts esp in am.She had C section 09/27/16 and had postpartum visit yesterday.  Review of Systems For nexplanon insertion +back pain at times,esp in am Reviewed past medical,surgical, social and family history. Reviewed medications and allergies.     Objective:   Physical Exam BP 122/70 (BP Location: Left Arm, Patient Position: Sitting, Cuff Size: Normal)   Pulse 96   Ht 5\' 3"  (1.6 m)   Wt 182 lb (82.6 kg)   Breastfeeding? No   BMI 32.24 kg/m UPT negative, Consent signed, time out called. Right  arm cleansed with betadine, and injected with 1.5 cc 1% lidocaine and waited til numb. Nexplanon easily inserted and steri strips applied.Rod easily palpated by provider and pt. Pressure dressing applied.   PHQ 2 score 0.  Assessment:     1. Nexplanon insertion   2. Muscle spasm       Plan:     Use condoms x 4 weeks, keep clean and dry x 24 hours, no heavy lifting, keep steri strips on x 72 hours, Keep pressure dressing on x 24 hours. Follow up prn problems.  Rx flexeril 10 mg #30 take 1 every 8 hours prn with 1 refill Use ice, call if persists

## 2016-11-03 NOTE — Patient Instructions (Addendum)
Use condoms x 4 weeks, keep clean and dry x 24 hours, no heavy lifting, keep steri strips on x 72 hours, Keep pressure dressing on x 24 hours. Follow up prn problems. Try ice and flexeril, call if persists

## 2016-11-09 ENCOUNTER — Ambulatory Visit (INDEPENDENT_AMBULATORY_CARE_PROVIDER_SITE_OTHER): Payer: Commercial Managed Care - HMO | Admitting: Family Medicine

## 2016-11-09 VITALS — BP 110/72 | Temp 99.5°F | Ht 63.0 in | Wt 175.0 lb

## 2016-11-09 DIAGNOSIS — J028 Acute pharyngitis due to other specified organisms: Secondary | ICD-10-CM

## 2016-11-09 DIAGNOSIS — J019 Acute sinusitis, unspecified: Secondary | ICD-10-CM | POA: Diagnosis not present

## 2016-11-09 MED ORDER — PROMETHAZINE HCL 25 MG PO TABS: ORAL_TABLET | ORAL | 0 refills | Status: DC

## 2016-11-09 MED ORDER — AZITHROMYCIN 250 MG PO TABS: ORAL_TABLET | ORAL | 0 refills | Status: DC

## 2016-11-09 NOTE — Progress Notes (Signed)
   Subjective:    Patient ID: [name], female    DOB: 05-04-1996, 21 y.o.   MRN: [id number]  Sinusitis  This is a new problem. Episode onset: one week. Associated symptoms include chills, congestion, coughing, headaches and a sore throat. Pertinent negatives include no ear pain or shortness of breath. (Body aches, nausea) Past treatments include acetaminophen (ibuprofen).   Patient with severe sore throat some congestion slight cough some chills and fever symptoms over the past couple days PMH benign   Review of Systems  Constitutional: Positive for chills and fever. Negative for activity change.  HENT: Positive for congestion and sore throat. Negative for ear pain and rhinorrhea.   Eyes: Negative for discharge.  Respiratory: Positive for cough. Negative for shortness of breath and wheezing.   Cardiovascular: Negative for chest pain.  Neurological: Positive for headaches.       Objective:   Physical Exam  Constitutional: She appears well-developed.  HENT:  Head: Normocephalic.  Nose: Nose normal.  Mouth/Throat: Oropharyngeal exudate present.  Neck: Neck supple.  Cardiovascular: Normal rate and normal heart sounds.   No murmur heard. Pulmonary/Chest: Effort normal and breath sounds normal. She has no wheezes.  Lymphadenopathy:    She has no cervical adenopathy.  Skin: Skin is warm and dry.  Nursing note and vitals reviewed.         Assessment & Plan:  Probable strep go ahead and treat with azithromycin Secondary rhinosinusitis should get better with treatment warning signs discuss follow-up if problems

## 2016-11-09 NOTE — Patient Instructions (Signed)
Strep Throat Strep throat is a bacterial infection of the throat. Your health care provider may call the infection tonsillitis or pharyngitis, depending on whether there is swelling in the tonsils or at the back of the throat. Strep throat is most common during the cold months of the year in children who are 5-22 years of age, but it can happen during any season in people of any age. This infection is spread from person to person (contagious) through coughing, sneezing, or close contact. What are the causes? Strep throat is caused by the bacteria called Streptococcus pyogenes. What increases the risk? This condition is more likely to develop in:  People who spend time in crowded places where the infection can spread easily.  People who have close contact with someone who has strep throat.  What are the signs or symptoms? Symptoms of this condition include:  Fever or chills.  Redness, swelling, or pain in the tonsils or throat.  Pain or difficulty when swallowing.  White or yellow spots on the tonsils or throat.  Swollen, tender glands in the neck or under the jaw.  Red rash all over the body (rare).  How is this diagnosed? This condition is diagnosed by performing a rapid strep test or by taking a swab of your throat (throat culture test). Results from a rapid strep test are usually ready in a few minutes, but throat culture test results are available after one or two days. How is this treated? This condition is treated with antibiotic medicine. Follow these instructions at home: Medicines  Take over-the-counter and prescription medicines only as told by your health care provider.  Take your antibiotic as told by your health care provider. Do not stop taking the antibiotic even if you start to feel better.  Have family members who also have a sore throat or fever tested for strep throat. They may need antibiotics if they have the strep infection. Eating and drinking  Do not  share food, drinking cups, or personal items that could cause the infection to spread to other people.  If swallowing is difficult, try eating soft foods until your sore throat feels better.  Drink enough fluid to keep your urine clear or pale yellow. General instructions  Gargle with a salt-water mixture 3-4 times per day or as needed. To make a salt-water mixture, completely dissolve -1 tsp of salt in 1 cup of warm water.  Make sure that all household members wash their hands well.  Get plenty of rest.  Stay home from school or work until you have been taking antibiotics for 24 hours.  Keep all follow-up visits as told by your health care provider. This is important. Contact a health care provider if:  The glands in your neck continue to get bigger.  You develop a rash, cough, or earache.  You cough up a thick liquid that is green, yellow-brown, or bloody.  You have pain or discomfort that does not get better with medicine.  Your problems seem to be getting worse rather than better.  You have a fever. Get help right away if:  You have new symptoms, such as vomiting, severe headache, stiff or painful neck, chest pain, or shortness of breath.  You have severe throat pain, drooling, or changes in your voice.  You have swelling of the neck, or the skin on the neck becomes red and tender.  You have signs of dehydration, such as fatigue, dry mouth, and decreased urination.  You become increasingly sleepy, or   you cannot wake up completely.  Your joints become red or painful. This information is not intended to replace advice given to you by your health care provider. Make sure you discuss any questions you have with your health care provider. Document Released: 11/20/2000 Document Revised: 07/22/2016 Document Reviewed: 03/18/2015 Elsevier Interactive Patient Education  2017 Elsevier Inc.  

## 2016-11-19 ENCOUNTER — Telehealth: Payer: Self-pay | Admitting: Adult Health

## 2016-11-19 MED ORDER — MEGESTROL ACETATE 40 MG PO TABS: ORAL_TABLET | ORAL | 1 refills | Status: DC

## 2016-11-19 NOTE — Telephone Encounter (Signed)
Will rx megace to stop bleeding  °

## 2016-11-19 NOTE — Telephone Encounter (Signed)
Spoke with pt. Pt got Nexplanon placed 11/03/16. She has been bleeding everyday since, medium flow. I advised it is normal to have irregular bleeding when you start a new birth control but hopefully bleeding will gradually get better. Pt is requesting something to help with bleeding. Please advise. Thanks!! Laurens

## 2016-12-30 ENCOUNTER — Ambulatory Visit: Payer: Commercial Managed Care - HMO | Admitting: Nurse Practitioner

## 2016-12-31 IMAGING — CT CT ABD-PELV W/ CM
2 of 4 series · 15 of 46 positions shown, 17 images · IV contrast (omnipaque)
Comparison: None.

CLINICAL DATA: Low back pain, onset [REDACTED] [REDACTED].  Fever.

EXAM:
CT ABDOMEN AND PELVIS WITH CONTRAST
TECHNIQUE: Multidetector CT imaging of the abdomen and pelvis was performed
using the standard protocol following bolus administration of
intravenous contrast.
CONTRAST:  100mL OMNIPAQUE IOHEXOL 300 MG/ML  SOLN

[Series 2: abd_pel_with 5.0 b40f · axial · 0.66mm/px · z∈[-481,-51]mm · 12 of 96 slices shown, 14 images]
[im 5/96  soft-tissue]
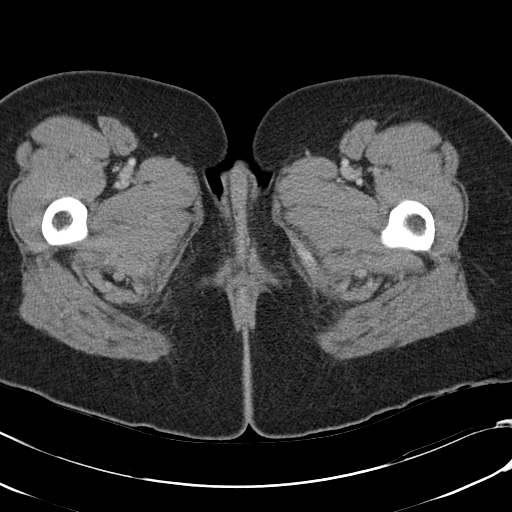
[im 5/96  bone]
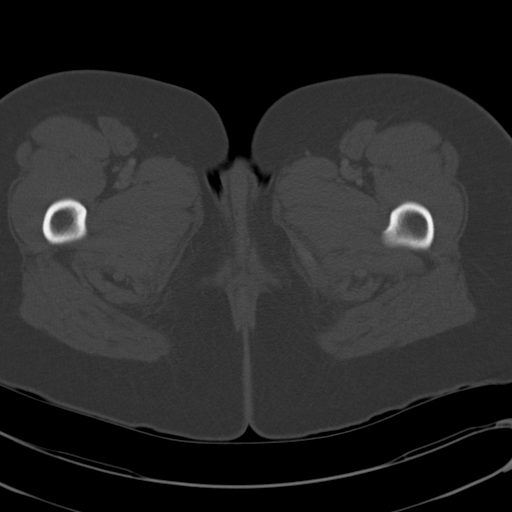
[im 14/96  soft-tissue]
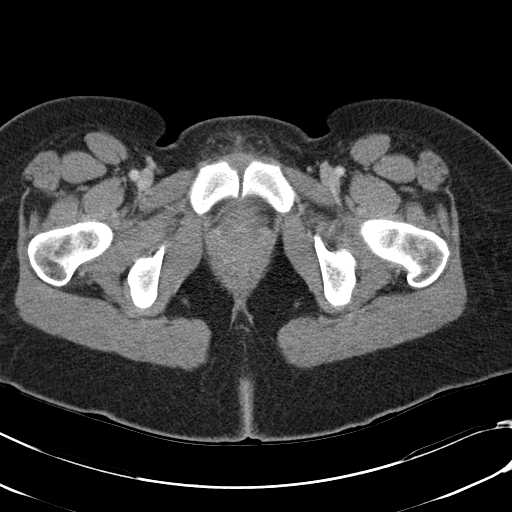
[im 23/96  soft-tissue]
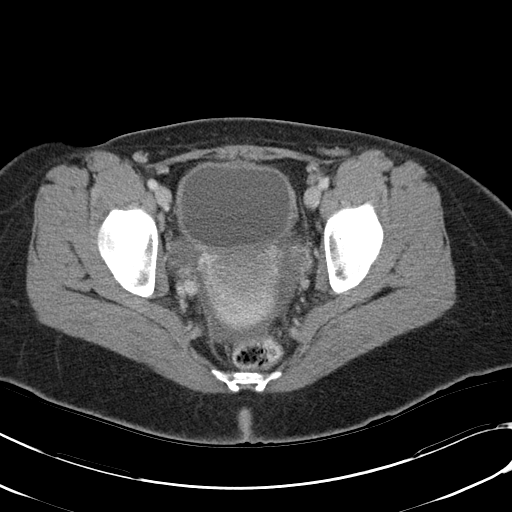
[im 28/96  soft-tissue]
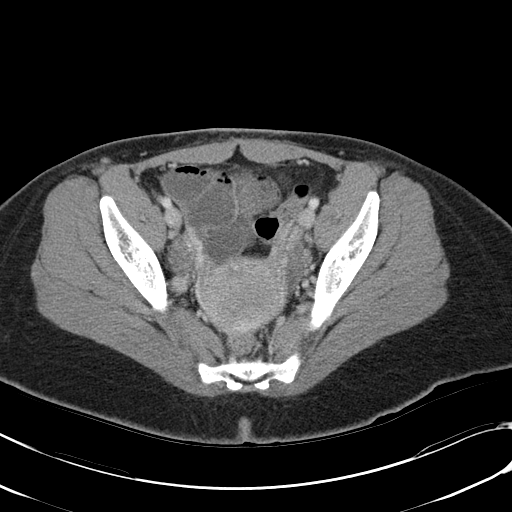
[im 37/96  soft-tissue]
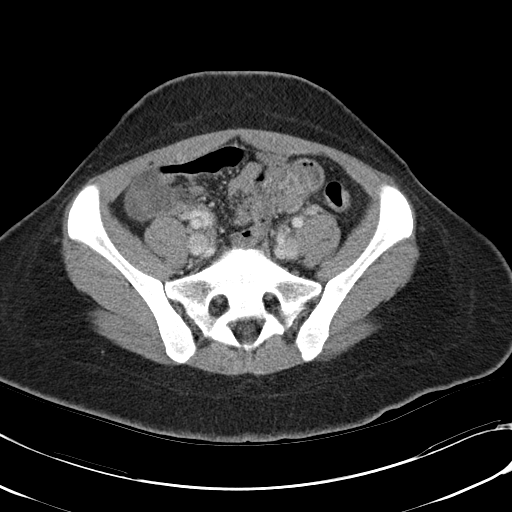
[im 46/96  soft-tissue]
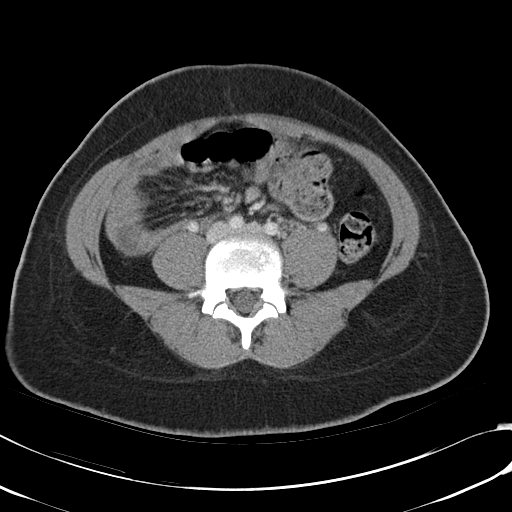
[im 50/96  soft-tissue]
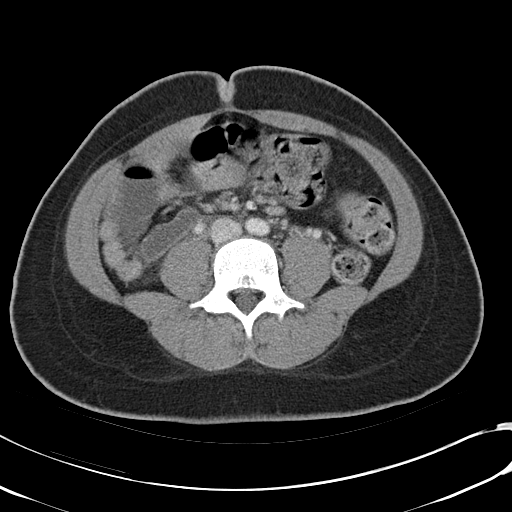
[im 59/96  soft-tissue]
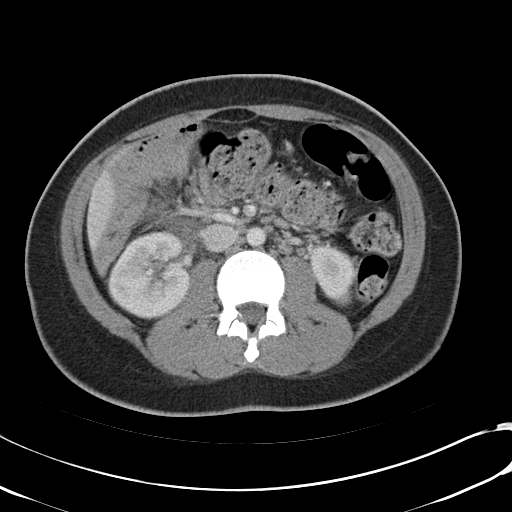
[im 68/96  soft-tissue]
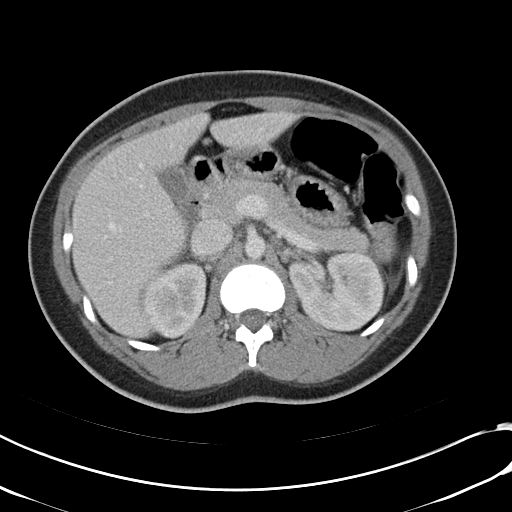
[im 68/96  bone]
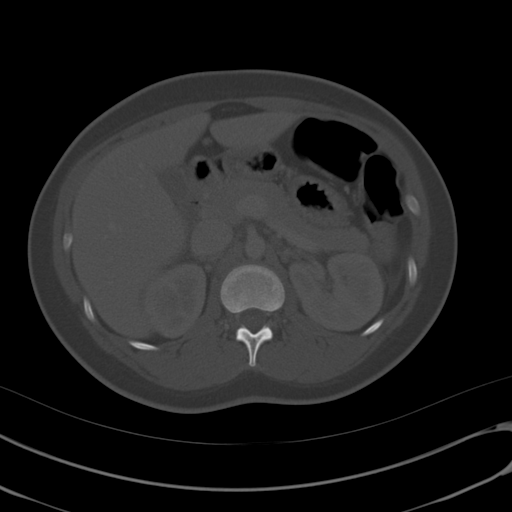
[im 73/96  soft-tissue]
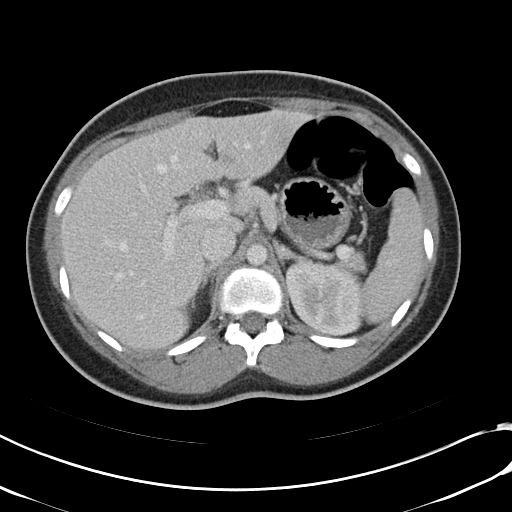
[im 82/96  soft-tissue]
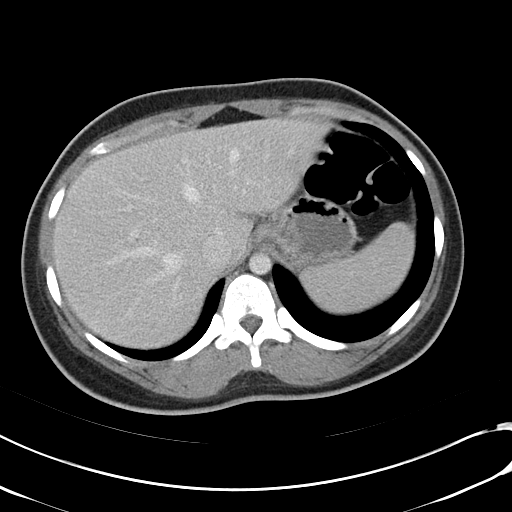
[im 91/96  soft-tissue]
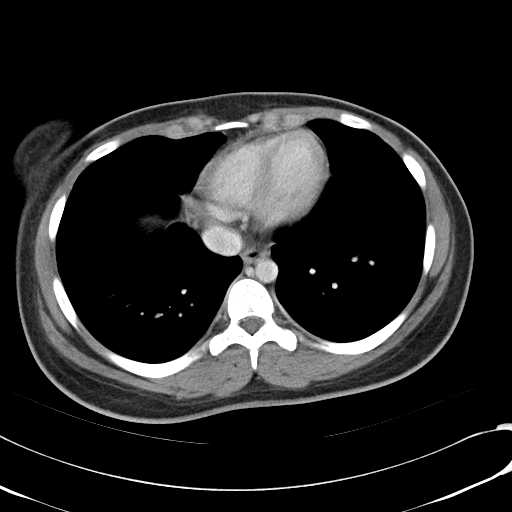

[Series 3: abd_pel_with 3.0 spo cor · coronal · 0.63mm/px · 3 of 79 slices shown]
[im 27/79  soft-tissue]
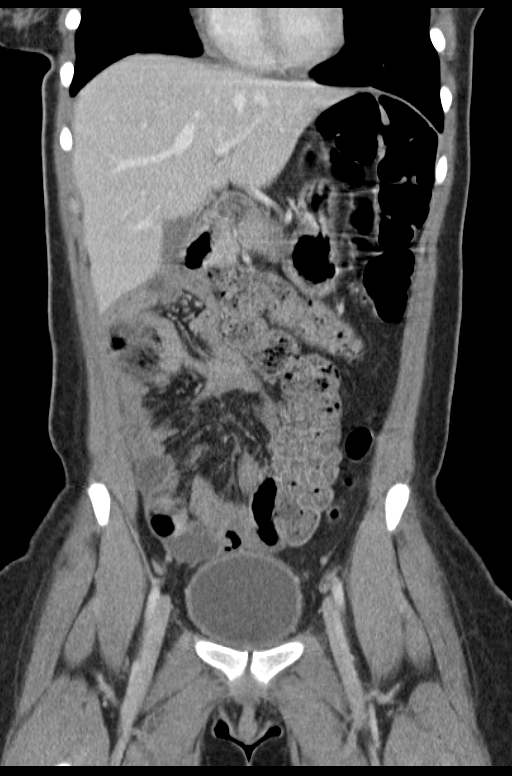
[im 35/79  soft-tissue]
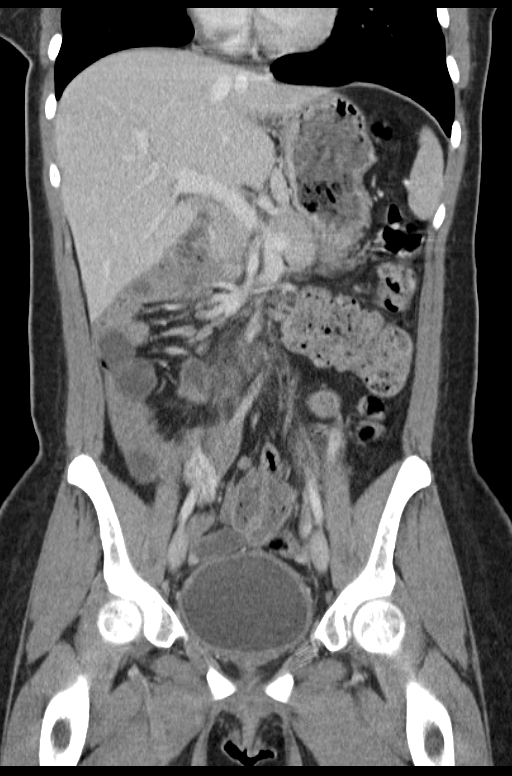
[im 44/79  soft-tissue]
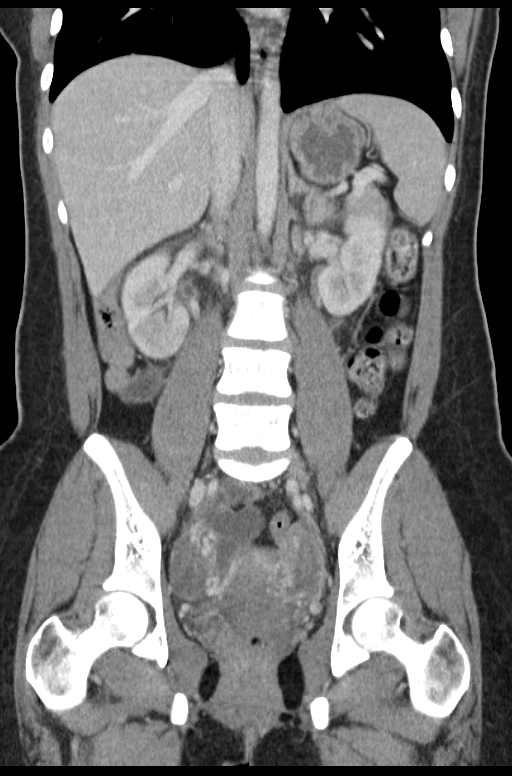

[15 of 46 positions shown; findings below may reference images not displayed]

FINDINGS: BODY WALL: No contributory findings.

LOWER CHEST: No contributory findings.

ABDOMEN/PELVIS:

Liver: No focal abnormality.

Biliary: No evidence of biliary obstruction or stone.

Pancreas: Unremarkable.

Spleen: Unremarkable.

Adrenals: Unremarkable.

Kidneys and ureters: There is bilateral urothelial thickening with
right perinephric and periureteric edema. There is patchy hypo
enhancement of the bilateral renal cortex, greater on the right and
best seen on coronal reformats. No stone or hydronephrosis. No
abscess.

Bladder: Circumferential thickening with perivesicular fat haziness.

Reproductive: No pathologic findings.

Bowel: Congenital non rotation of bowel with small bowel on the
right and colon on the left. The duodenum does not cross the
midline. No appendicitis.

Retroperitoneum: Enlarged ileocolic lymph nodes, likely from remote
inflammation given the circumstances.

Peritoneum: No ascites or pneumoperitoneum.

Vascular: No acute abnormality. Reversed SMV and SMA positioning, as
above.

OSSEOUS: No acute abnormalities.
IMPRESSION: 1. Cystitis and bilateral pyelonephritis. No hydronephrosis or
abscess.
2. Malrotation/non-rotation of bowel.

## 2017-01-08 ENCOUNTER — Encounter: Payer: Self-pay | Admitting: Nurse Practitioner

## 2017-01-08 ENCOUNTER — Ambulatory Visit (INDEPENDENT_AMBULATORY_CARE_PROVIDER_SITE_OTHER): Payer: Commercial Managed Care - HMO | Admitting: Nurse Practitioner

## 2017-01-08 VITALS — BP 122/76 | Ht 63.0 in | Wt 191.4 lb

## 2017-01-08 DIAGNOSIS — M5442 Lumbago with sciatica, left side: Secondary | ICD-10-CM

## 2017-01-08 MED ORDER — DICLOFENAC SODIUM 75 MG PO TBEC: 75.0000 mg | DELAYED_RELEASE_TABLET | Freq: Two times a day (BID) | ORAL | 0 refills | Status: DC

## 2017-01-08 MED ORDER — PHENTERMINE HCL 37.5 MG PO TABS: 37.5000 mg | ORAL_TABLET | Freq: Every day | ORAL | 0 refills | Status: DC

## 2017-01-08 NOTE — Patient Instructions (Signed)
Stretching and back exercises Ice or heat applications

## 2017-01-09 ENCOUNTER — Encounter: Payer: Self-pay | Admitting: Nurse Practitioner

## 2017-01-09 DIAGNOSIS — M5442 Lumbago with sciatica, left side: Secondary | ICD-10-CM | POA: Insufficient documentation

## 2017-01-09 NOTE — Progress Notes (Signed)
Subjective:  Presents for complaints of low back pain that began during her fifth month of pregnancy. Delivered her baby in October. Has been consistent. Mainly in the left low back area going down the left buttock into the lateral left thigh. Does not extend beyond this area. No numbness or weakness of the left leg. Worse with prolonged standing or sitting. Also concerned about her weight, has had difficulty losing weight and has gained weight postpartum. Has On for birth control. Diet high in simple carbs. Has begun walking about 3 times per week. Is not breast-feeding.  Objective:   BP 122/76   Ht 5\' 3"  (1.6 m)   Wt 191 lb 6.4 oz (86.8 kg)   BMI 33.90 kg/m  NAD. Alert, oriented. Lungs clear. Heart regular rate rhythm. Mild tenderness in the left low back area radiating into the left mid buttock. SLR negative on the right, positive on the left. Reflexes normal limit lower extremities. Gait normal limit. Slow steady weight gain noted postpartum.  Assessment:   Problem List Items Addressed This Visit      Other   Acute left-sided low back pain with left-sided sciatica - Primary   Relevant Medications   diclofenac (VOLTAREN) 75 MG EC tablet   Morbid obesity (HCC)   Relevant Medications   phentermine (ADIPEX-P) 37.5 MG tablet       Plan:  Meds ordered this encounter  Medications  . phentermine (ADIPEX-P) 37.5 MG tablet    Sig: Take 1 tablet (37.5 mg total) by mouth daily before breakfast.    Dispense:  30 tablet    Refill:  0    Order Specific Question:   Supervising Provider    Answer:   Mikey Kirschner [[id number]]  . diclofenac (VOLTAREN) 75 MG EC tablet    Sig: Take 1 tablet (75 mg total) by mouth 2 (two) times daily.    Dispense:  30 tablet    Refill:  0    Order Specific Question:   Supervising Provider    Answer:   Mikey Kirschner [[id number]]   Diclofenac twice a day with food when necessary. Ice or heat applications.  Given a copy of low back exercises. Start phentermine as  directed. Reviewed potential adverse effects. DC med and call if any problems. Recheck in one month, call back sooner if any problems.

## 2017-02-05 ENCOUNTER — Ambulatory Visit: Payer: Commercial Managed Care - HMO | Admitting: Nurse Practitioner

## 2017-02-10 ENCOUNTER — Ambulatory Visit: Payer: Commercial Managed Care - HMO | Admitting: Nurse Practitioner

## 2017-02-18 ENCOUNTER — Encounter: Payer: Self-pay | Admitting: Family Medicine

## 2017-03-10 ENCOUNTER — Encounter: Payer: Self-pay | Admitting: Family Medicine

## 2017-03-10 ENCOUNTER — Ambulatory Visit (INDEPENDENT_AMBULATORY_CARE_PROVIDER_SITE_OTHER): Payer: Commercial Managed Care - HMO | Admitting: Family Medicine

## 2017-03-10 VITALS — Temp 98.6°F | Ht 63.0 in | Wt 191.4 lb

## 2017-03-10 DIAGNOSIS — J019 Acute sinusitis, unspecified: Secondary | ICD-10-CM | POA: Diagnosis not present

## 2017-03-10 DIAGNOSIS — B9689 Other specified bacterial agents as the cause of diseases classified elsewhere: Secondary | ICD-10-CM

## 2017-03-10 MED ORDER — AZITHROMYCIN 250 MG PO TABS: ORAL_TABLET | ORAL | 0 refills | Status: DC

## 2017-03-10 NOTE — Progress Notes (Signed)
   Subjective:    Patient ID: Melissa Livingston, female    DOB: 1996/01/02, 21 y.o.   MRN: [id number]  Cough  This is a new problem. The current episode started in the past 7 days. Associated symptoms include rhinorrhea. Pertinent negatives include no chest pain, ear pain, fever, shortness of breath or wheezing. Treatments tried: mucinex.  Viral like illness for about a week over the past several days congestion coughing sinus pressure not feeling good denies high fever chills or wheezing    Review of Systems  Constitutional: Negative for activity change and fever.  HENT: Positive for congestion and rhinorrhea. Negative for ear pain.   Eyes: Negative for discharge.  Respiratory: Positive for cough. Negative for shortness of breath and wheezing.   Cardiovascular: Negative for chest pain.       Objective:   Physical Exam  Constitutional: She appears well-developed.  HENT:  Head: Normocephalic.  Nose: Nose normal.  Mouth/Throat: Oropharynx is clear and moist. No oropharyngeal exudate.  Neck: Neck supple.  Cardiovascular: Normal rate and normal heart sounds.   No murmur heard. Pulmonary/Chest: Effort normal and breath sounds normal. She has no wheezes.  Lymphadenopathy:    She has no cervical adenopathy.  Skin: Skin is warm and dry.  Nursing note and vitals reviewed.         Assessment & Plan:  Viral syndrome Secondary rhinosinusitis Antibiotics prescribed warning signs discussed Follow-up if progressive troubles or worse

## 2017-04-20 ENCOUNTER — Encounter: Payer: Self-pay | Admitting: Family Medicine

## 2017-04-20 ENCOUNTER — Ambulatory Visit (INDEPENDENT_AMBULATORY_CARE_PROVIDER_SITE_OTHER): Payer: Commercial Managed Care - HMO | Admitting: Family Medicine

## 2017-04-20 VITALS — Temp 98.3°F | Ht 63.0 in | Wt 194.2 lb

## 2017-04-20 DIAGNOSIS — K219 Gastro-esophageal reflux disease without esophagitis: Secondary | ICD-10-CM | POA: Diagnosis not present

## 2017-04-20 DIAGNOSIS — I889 Nonspecific lymphadenitis, unspecified: Secondary | ICD-10-CM

## 2017-04-20 MED ORDER — DOXYCYCLINE HYCLATE 100 MG PO TABS: 100.0000 mg | ORAL_TABLET | Freq: Two times a day (BID) | ORAL | 0 refills | Status: DC

## 2017-04-20 MED ORDER — PANTOPRAZOLE SODIUM 40 MG PO TBEC: 40.0000 mg | DELAYED_RELEASE_TABLET | Freq: Every day | ORAL | 3 refills | Status: DC

## 2017-04-20 NOTE — Progress Notes (Signed)
   Subjective:    Patient ID: Melissa Livingston, female    DOB: 1996-08-25, 21 y.o.   MRN: [id number]  HPI Patient arrives with c/o congestion and drainage with swollen and sore lymph nodes in neck and sore throat for several days Patient relates with lymph nodes on left side neck head congestion drainage coughing denies high fever chills sweats in addition to this has some burning esophageal issues with reflux. Patient relates a lot of heartburn issues with reflux tried over-the-counter measures without success Review of Systems Denies high fever chills sweats denies wheezing difficulty breathing    Objective:   Physical Exam Minimal lymphadenitis left side throat normal lungs clear heart regular       Assessment & Plan:  Pantoprazole for reflux dietary measures discuss follow-up if ongoing troubles recheck in the fall  Mild cervical lymphadenitis antibiotics for one week call us if progressive troubles or worse

## 2017-04-26 ENCOUNTER — Ambulatory Visit: Payer: Commercial Managed Care - HMO | Admitting: Family Medicine

## 2017-06-03 ENCOUNTER — Ambulatory Visit (INDEPENDENT_AMBULATORY_CARE_PROVIDER_SITE_OTHER): Payer: 59 | Admitting: Family Medicine

## 2017-06-03 ENCOUNTER — Encounter: Payer: Self-pay | Admitting: Family Medicine

## 2017-06-03 VITALS — BP 124/72 | Ht 63.0 in | Wt 199.4 lb

## 2017-06-03 DIAGNOSIS — K2100 Gastro-esophageal reflux disease with esophagitis, without bleeding: Secondary | ICD-10-CM

## 2017-06-03 MED ORDER — ESOMEPRAZOLE MAGNESIUM 40 MG PO CPDR: 40.0000 mg | DELAYED_RELEASE_CAPSULE | Freq: Every day | ORAL | 3 refills | Status: DC

## 2017-06-03 NOTE — Patient Instructions (Signed)
Food Choices for Gastroesophageal Reflux Disease, Adult When you have gastroesophageal reflux disease (GERD), the foods you eat and your eating habits are very important. Choosing the right foods can help ease your discomfort. What guidelines do I need to follow?  Choose fruits, vegetables, whole grains, and low-fat dairy products.  Choose low-fat meat, fish, and poultry.  Limit fats such as oils, salad dressings, butter, nuts, and avocado.  Keep a food diary. This helps you identify foods that cause symptoms.  Avoid foods that cause symptoms. These may be different for everyone.  Eat small meals often instead of 3 large meals a day.  Eat your meals slowly, in a place where you are relaxed.  Limit fried foods.  Cook foods using methods other than frying.  Avoid drinking alcohol.  Avoid drinking large amounts of liquids with your meals.  Avoid bending over or lying down until 2-3 hours after eating. What foods are not recommended? These are some foods and drinks that may make your symptoms worse: Vegetables  Tomatoes. Tomato juice. Tomato and spaghetti sauce. Chili peppers. Onion and garlic. Horseradish. Fruits  Oranges, grapefruit, and lemon (fruit and juice). Meats  High-fat meats, fish, and poultry. This includes hot dogs, ribs, ham, sausage, salami, and bacon. Dairy  Whole milk and chocolate milk. Sour cream. Cream. Butter. Ice cream. Cream cheese. Drinks  Coffee and tea. Bubbly (carbonated) drinks or energy drinks. Condiments  Hot sauce. Barbecue sauce. Sweets/Desserts  Chocolate and cocoa. Donuts. Peppermint and spearmint. Fats and Oils  High-fat foods. This includes French fries and potato chips. Other  Vinegar. Strong spices. This includes black pepper, white pepper, red pepper, cayenne, curry powder, cloves, ginger, and chili powder. The items listed above may not be a complete list of foods and drinks to avoid. Contact your dietitian for more information.    This information is not intended to replace advice given to you by your health care provider. Make sure you discuss any questions you have with your health care provider. Document Released: 05/24/2012 Document Revised: 04/30/2016 Document Reviewed: 09/27/2013 Elsevier Interactive Patient Education  2017 Elsevier Inc.  

## 2017-06-03 NOTE — Progress Notes (Signed)
   Subjective:    Patient ID: Melissa Livingston, female    DOB: [birth year], 21 y.o.   MRN: [id number]  Gastroesophageal Reflux  She complains of abdominal pain, chest pain and heartburn. The current episode started more than 1 month ago. Treatments tried: zantac, protonix.   Patient relates significant epigastric and reflux related discomfort denies high fever chills sweats EGD from 2014 reviewed including biopsy result  Review of Systems  Cardiovascular: Positive for chest pain.  Gastrointestinal: Positive for abdominal pain and heartburn.  Patient with slight discomfort when she swallows but no true dysphagia no vomiting no weight loss     Objective:   Physical Exam Lungs clear heart regular abdomen soft no guarding rebound or tenderness       Assessment & Plan:  Significant gastritis unfortunately unable to predict whether or not insurance company will cover Nexium we will try for that may need to change to a different medicine patient was told if not significant improvement in the next 2 weeks notify us we'll set her up with gastroenterology educational information given

## 2017-06-14 ENCOUNTER — Encounter: Payer: Self-pay | Admitting: Obstetrics & Gynecology

## 2017-06-14 ENCOUNTER — Ambulatory Visit (INDEPENDENT_AMBULATORY_CARE_PROVIDER_SITE_OTHER): Payer: 59 | Admitting: Obstetrics & Gynecology

## 2017-06-14 VITALS — BP 122/74 | HR 85 | Wt 202.0 lb

## 2017-06-14 DIAGNOSIS — B9689 Other specified bacterial agents as the cause of diseases classified elsewhere: Secondary | ICD-10-CM

## 2017-06-14 DIAGNOSIS — N760 Acute vaginitis: Secondary | ICD-10-CM | POA: Diagnosis not present

## 2017-06-14 MED ORDER — METRONIDAZOLE 0.75 % VA GEL: 1.0000 | Freq: Two times a day (BID) | VAGINAL | 0 refills | Status: DC

## 2017-06-14 NOTE — Progress Notes (Signed)
       Chief Complaint  Patient presents with  . Vaginal Discharge    Blood pressure 122/74, pulse 85, weight 202 lb (91.6 kg), not currently breastfeeding.  22 y.o. [id number] No LMP recorded (lmp unknown). Patient has had an implant. The current method of family planning is nexplanon.  Subjective Vaginal discharge for 2weeks Itching no Irritation yes Odor yes Similar to previous no  Previous treatment none  Objective Vulva:  normal appearing vulva with no masses, tenderness or lesions Vagina:  normal mucosa, thin grey discharge Cervix:  no lesions Uterus:  normal size, contour, position, consistency, mobility, non-tender Adnexa: ovaries:,       Pertinent ROS Pt has been on a lot of antibiotics due to sinuses and an infected wisdom teeth  Labs or studies Wet Prep:   A sample of vaginal discharge was obtained from the posterior fornix using a cotton swab. 2 drops of saline were placed on a slide and the cotton swab was immersed in the saline. Microscopic evaluation was performed and results were as follows:  Negative  for yeast  Positive for clue cells , consistent with Bacterial vaginosis Negative for trichomonas  Normal WBC population   Whiff test: Positive     Impression Diagnoses this Encounter::   ICD-10-CM   1. BV (bacterial vaginosis) N76.0    B96.89     Established relevant diagnosis(es):   Plan/Recommendations: Meds ordered this encounter  Medications  . metroNIDAZOLE (METROGEL VAGINAL) 0.75 % vaginal gel    Sig: Place 1 Applicatorful vaginally 2 (two) times daily.    Dispense:  70 g    Refill:  0    Labs or Scans Ordered: No orders of the defined types were placed in this encounter.   Management:: Metro gel vaginal cream x 5  Follow up Return if symptoms worsen or fail to improve.         All questions were answered.

## 2017-06-30 ENCOUNTER — Encounter: Payer: Self-pay | Admitting: Family Medicine

## 2017-06-30 ENCOUNTER — Telehealth: Payer: Self-pay | Admitting: Internal Medicine

## 2017-06-30 ENCOUNTER — Encounter: Payer: Self-pay | Admitting: Gastroenterology

## 2017-06-30 ENCOUNTER — Ambulatory Visit (INDEPENDENT_AMBULATORY_CARE_PROVIDER_SITE_OTHER): Payer: 59 | Admitting: Family Medicine

## 2017-06-30 VITALS — BP 116/78 | Ht 63.0 in | Wt 202.0 lb

## 2017-06-30 DIAGNOSIS — R197 Diarrhea, unspecified: Secondary | ICD-10-CM

## 2017-06-30 DIAGNOSIS — R1010 Upper abdominal pain, unspecified: Secondary | ICD-10-CM | POA: Diagnosis not present

## 2017-06-30 MED ORDER — DICYCLOMINE HCL 20 MG PO TABS: 20.0000 mg | ORAL_TABLET | Freq: Three times a day (TID) | ORAL | 1 refills | Status: DC | PRN

## 2017-06-30 NOTE — Telephone Encounter (Signed)
Yes, an urgent will be fine.

## 2017-06-30 NOTE — Progress Notes (Signed)
   Subjective:    Patient ID: [name], female    DOB: [birth year], 21 y.o.   MRN: [id number]  Abdominal Pain  This is a new problem. The current episode started more than 1 month ago. The onset quality is sudden. The problem occurs constantly. The problem has been unchanged. The pain is located in the periumbilical region. The pain is at a severity of 7/10. The pain is severe. The quality of the pain is cramping. The abdominal pain does not radiate. The pain is aggravated by eating, movement and belching. The pain is relieved by belching and bowel movements. She has tried proton pump inhibitors and antacids for the symptoms. The treatment provided mild relief.   Diet does not matter she feels the same whether food is spicy/greasy or not.  No other concerns.  Review of Systems  Gastrointestinal: Positive for abdominal pain.       Objective:   Physical Exam Patient relates a lot of intermittent abdominal pains some nausea some heartburn intermittent diarrhea denies bloody stools denies hematuria no fever chills sweats appetite okay findings multiple foods including milk products many type of meats cause her trouble       Assessment & Plan:  Reoccurring abdominal pain with reflux Some element of IBS Referral back to gastroenterology may end up needing another scope tests Patient had an ultrasound back in 2016 gallbladder looked good then May need other intervention await lab work referral to GI

## 2017-06-30 NOTE — Telephone Encounter (Signed)
OV made and letter mailed °

## 2017-06-30 NOTE — Telephone Encounter (Signed)
Brendale from Dr Malachy Moan' office called to say that the PCP wanted the patient seen sooner than 8/22 @ 0830 with AB. Pt is having reflux and RUQ pain. Please advise if I can use an URG spot.

## 2017-06-30 NOTE — Progress Notes (Signed)
Nurses please let the patient be aware of her appointment on August 6 11 AM with Magda Paganini Lewis-patient should arrive at 1045

## 2017-07-01 LAB — CBC WITH DIFFERENTIAL/PLATELET
BASOS ABS: 0 10*3/uL (ref 0.0–0.2)
Basos: 0 %
EOS (ABSOLUTE): 0 10*3/uL (ref 0.0–0.4)
Eos: 1 %
Hematocrit: 39.6 % (ref 34.0–46.6)
Hemoglobin: 12.5 g/dL (ref 11.1–15.9)
IMMATURE GRANULOCYTES: 0 %
Immature Grans (Abs): 0 10*3/uL (ref 0.0–0.1)
LYMPHS ABS: 0.8 10*3/uL (ref 0.7–3.1)
Lymphs: 11 %
MCH: 23.5 pg — ABNORMAL LOW (ref 26.6–33.0)
MCHC: 31.6 g/dL (ref 31.5–35.7)
MCV: 74 fL — ABNORMAL LOW (ref 79–97)
MONOS ABS: 0.5 10*3/uL (ref 0.1–0.9)
Monocytes: 6 %
NEUTROS PCT: 82 %
Neutrophils Absolute: 6.6 10*3/uL (ref 1.4–7.0)
PLATELETS: 343 10*3/uL (ref 150–379)
RBC: 5.33 x10E6/uL — AB (ref 3.77–5.28)
RDW: 17.3 % — AB (ref 12.3–15.4)
WBC: 7.9 10*3/uL (ref 3.4–10.8)

## 2017-07-01 LAB — TISSUE TRANSGLUTAMINASE, IGA: t-Transglutaminase (tTG) IgA: <2 U/mL (ref 0–3)

## 2017-07-01 LAB — HEPATIC FUNCTION PANEL
ALK PHOS: 107 IU/L (ref 39–117)
ALT: 10 IU/L (ref 0–32)
AST: 16 IU/L (ref 0–40)
Albumin: 4.2 g/dL (ref 3.5–5.5)
Bilirubin Total: 0.6 mg/dL (ref 0.0–1.2)
Bilirubin, Direct: 0.12 mg/dL (ref 0.00–0.40)
TOTAL PROTEIN: 7.3 g/dL (ref 6.0–8.5)

## 2017-07-01 LAB — SEDIMENTATION RATE: Sed Rate: 7 mm/hr (ref 0–32)

## 2017-07-01 LAB — LIPASE: Lipase: 32 U/L (ref 14–72)

## 2017-07-12 ENCOUNTER — Other Ambulatory Visit: Payer: Self-pay

## 2017-07-12 ENCOUNTER — Encounter: Payer: Self-pay | Admitting: Gastroenterology

## 2017-07-12 ENCOUNTER — Telehealth: Payer: Self-pay

## 2017-07-12 ENCOUNTER — Ambulatory Visit (INDEPENDENT_AMBULATORY_CARE_PROVIDER_SITE_OTHER): Payer: 59 | Admitting: Gastroenterology

## 2017-07-12 VITALS — BP 141/88 | HR 94 | Temp 97.5°F | Ht 63.0 in | Wt 199.0 lb

## 2017-07-12 DIAGNOSIS — K2950 Unspecified chronic gastritis without bleeding: Secondary | ICD-10-CM | POA: Diagnosis not present

## 2017-07-12 DIAGNOSIS — K219 Gastro-esophageal reflux disease without esophagitis: Secondary | ICD-10-CM

## 2017-07-12 DIAGNOSIS — K582 Mixed irritable bowel syndrome: Secondary | ICD-10-CM

## 2017-07-12 DIAGNOSIS — K625 Hemorrhage of anus and rectum: Secondary | ICD-10-CM | POA: Diagnosis not present

## 2017-07-12 DIAGNOSIS — R1013 Epigastric pain: Secondary | ICD-10-CM

## 2017-07-12 MED ORDER — CLENPIQ 10-3.5-12 MG-GM -GM/160ML PO SOLN: 1.0000 | Freq: Once | ORAL | 0 refills | Status: AC

## 2017-07-12 MED ORDER — DEXLANSOPRAZOLE 60 MG PO CPDR: 60.0000 mg | DELAYED_RELEASE_CAPSULE | Freq: Every day | ORAL | 3 refills | Status: DC

## 2017-07-12 NOTE — Progress Notes (Signed)
cc'ed to pcp °

## 2017-07-12 NOTE — Progress Notes (Signed)
Primary Care Physician: Kathyrn Drown, MD  Primary Gastroenterologist:  Garfield Cornea, MD   Chief Complaint  Patient presents with  . Abdominal Pain    HPI: Melissa Livingston is a 21 y.o. female hereFor follow-up of acute on chronic abdominal pain. Symptoms have been worse for the past 1 month. We initially met the patient back in 2014, at that time was having similar symptoms. On Relafen for knee injury at the time. She had an EGD which was unremarkable. Patient and mother had refused colonoscopy at the time which was offered due to change in bowel habits and bright red blood per rectum. She was seen back in follow-up a couple years later, mid 2016, colonoscopy scheduled but prior to having this done she ended up acutely ill in the ICU with bilateral pyelonephritis/sepsis.  Essentially her symptoms have worsened over the past 1-2 months. Complains of ongoing alternating constipation and diarrhea, about 50% each. Some bright red blood per rectum especially with hard stool. No melena. She notes postprandial epigastric pain, crampy in nature. Sometimes when it happens she actually has some regurgitation and heartburn. We'll feel the urge to have a BM, sometime she does other time she can't go. Some modest relief and abdominal cramping after BM but it does not go away. Tried various over-the-counter acid reducers. Has been back on pantoprazole 40 mg daily for about 6 months. Really not a big benefit. Couple weeks ago started on Bentyl 20 mg 3 times a day with some modest relief. She takes ibuprofen 400 mg about 3-4 times weekly. Denies aspirin powders. Currently has Nexplanon implant, states actively having menses.  Crohn's on dad's side of family.   Recent labs including sedimentation rate, TTG, LFTs, lipase, CBC unremarkable.  Current Outpatient Prescriptions  Medication Sig Dispense Refill  . acetaminophen (TYLENOL) 325 MG tablet Take 650 mg by mouth every 6 (six) hours as needed for mild  pain, moderate pain or headache.     . dicyclomine (BENTYL) 20 MG tablet Take 1 tablet (20 mg total) by mouth 3 (three) times daily as needed. 30 tablet 1  . escitalopram (LEXAPRO) 10 MG tablet Take 1 tablet (10 mg total) by mouth daily. 30 tablet 3  . etonogestrel (NEXPLANON) 68 MG IMPL implant 1 each by Subdermal route once.    . Iron-FA-B Cmp-C-Biot-Probiotic (FUSION PLUS) CAPS Take 1 capsule by mouth daily.    . pantoprazole (PROTONIX) 40 MG tablet Take 1 tablet (40 mg total) by mouth daily. 30 tablet 3   No current facility-administered medications for this visit.     Allergies as of 07/12/2017 - Review Complete 07/12/2017  Allergen Reaction Noted  . Adhesive [tape] Other (See Comments) 06/14/2015  . Lorabid [loracarbef] Hives, Swelling, and Other (See Comments) 03/28/2013  . Latex Itching and Rash 07/18/2013  . Orange fruit [citrus] Rash 07/18/2013   Past Medical History:  Diagnosis Date  . Asthma   . Endometritis 01/11/2015  . GERD (gastroesophageal reflux disease)   . History of ovarian cyst 01/28/2015  . IBS (irritable bowel syndrome)   . Nausea and vomiting 01/11/2015  . Ovarian cyst 01/14/2015  . Pelvic pain in female 01/11/2015  . Pregnancy induced hypertension   . Pregnant 02/05/2016   Past Surgical History:  Procedure Laterality Date  . CESAREAN SECTION N/A 09/29/2014   Procedure: CESAREAN SECTION;  Surgeon: Jonnie Kind, MD;  Location: [facility];  Service: Obstetrics;  Laterality: N/A;  . CESAREAN SECTION N/A 09/25/2016  Procedure: CESAREAN SECTION;  Surgeon: John V Ferguson, MD;  Location: [facility];  Service: Obstetrics;  Laterality: N/A;  . ESOPHAGOGASTRODUODENOSCOPY (EGD) WITH ESOPHAGEAL DILATION N/A 07/19/2013   Dr. Rourk:normal appearing esophagus s/p dilation, normal D1 and D2, unremarkable path   . none    . WISDOM TOOTH EXTRACTION     Family History  Problem Relation Age of Onset  . Multiple sclerosis Mother   . Heart disease Maternal Grandfather   .  Heart disease Paternal Uncle   . Diabetes Paternal Uncle   . Hypertension Paternal Uncle   . Cancer Other        father's aunt had breast cancer  . Colon cancer Other        maternal great uncle, age greater than 60  . Crohn's disease Other        couple of family members on father's side of family   Social History  Substance Use Topics  . Smoking status: Never Smoker  . Smokeless tobacco: Never Used  . Alcohol use No    ROS:  General: Negative for anorexia, weight loss, fever, chills, fatigue, weakness. ENT: Negative for hoarseness, difficulty swallowing , nasal congestion. CV: Negative for chest pain, angina, palpitations, dyspnea on exertion, peripheral edema.  Respiratory: Negative for dyspnea at rest, dyspnea on exertion, cough, sputum, wheezing.  GI: See history of present illness. GU:  Negative for dysuria, hematuria, urinary incontinence, urinary frequency, nocturnal urination.  Endo: Negative for unusual weight change.    Physical Examination:   BP (!) 141/88   Pulse 94   Temp (!) 97.5 F (36.4 C) (Oral)   Ht 5' 3" (1.6 m)   Wt 199 lb (90.3 kg)   LMP 07/12/2017   BMI 35.25 kg/m   General: Well-nourished, well-developed in no acute distress.  Eyes: No icterus. Mouth: Oropharyngeal mucosa moist and pink , no lesions erythema or exudate. Lungs: Clear to auscultation bilaterally.  Heart: Regular rate and rhythm, no murmurs rubs or gallops.  Abdomen: Bowel sounds are normal, nondistended, no hepatosplenomegaly or masses, no abdominal bruits or hernia , no rebound or guarding.  Moderate epigastric tenderness to deep palpation Extremities: No lower extremity edema. No clubbing or deformities. Neuro: Alert and oriented x 4   Skin: Warm and dry, no jaundice.   Psych: Alert and cooperative, normal mood and affect.  Labs:  Lab Results  Component Value Date   ESRSEDRATE 7 06/30/2017   Lab Results  Component Value Date   WBC 7.9 06/30/2017   HGB 12.5 06/30/2017     HCT 39.6 06/30/2017   MCV 74 (L) 06/30/2017   PLT 343 06/30/2017    Lab Results  Component Value Date   ALT 10 06/30/2017   AST 16 06/30/2017   ALKPHOS 107 06/30/2017   BILITOT 0.6 06/30/2017   Lab Results  Component Value Date   LIPASE 32 06/30/2017    TTG <2 on 06/30/17   Imaging Studies: No results found.   Impression/plan:  21-year-old female with acute on chronic abdominal pain including alternating constipation/diarrhea, crampy epigastric pain worse with meals and sometimes relieved with BM, GERD, intermittent rectal bleeding. Chronic intermittent NSAIDs for knee injury. Partial relief with use of PPI and Bentyl. Suspect gastritis, IBS, benign anorectal bleeding however cannot exclude peptic ulcer disease, IBD.  Plan for colonoscopy with possible upper endoscopy in the near future with Dr. Rourk, deep sedation in the OR.  I have discussed the risks, alternatives, benefits with regards to but not limited   to the risk of reaction to medication, bleeding, infection, perforation and the patient is agreeable to proceed. Written consent to be obtained.   Switch pantoprazole to Dexilant 23m daily. Continue Bentyl but became mindful that I can worsen constipation. Hold if necessary.

## 2017-07-12 NOTE — Patient Instructions (Signed)
1. Colonoscopy with possible upper endoscopy as scheduled. See separate instructions.  2. Stop pantoprazole. Start dexilant once daily before breakfast.  3. If constipated, hold bentyl for day or two until stools return.

## 2017-07-12 NOTE — Telephone Encounter (Signed)
Melissa Livingston called to inform me that they could not get the Clenpiq in so we changed her prep to Suprep. New instructions are in the mail.

## 2017-07-23 NOTE — Patient Instructions (Signed)
[place]  07/23/2017     @PREFPERIOPPHARMACY @   Your procedure is scheduled on  08/02/2017.  Report to [facility] at  800  A.M.  Call this number if you have problems the morning of surgery:  [phone]   Remember:  Do not eat food or drink liquids after midnight.  Take these medicines the morning of surgery with A SIP OF WATER  Dexilant, lexapro.   Do not wear jewelry, make-up or nail polish.  Do not wear lotions, powders, or perfumes, or deoderant.  Do not shave 48 hours prior to surgery.  Men may shave face and neck.  Do not bring valuables to the hospital.  [facility] is not responsible for any belongings or valuables.  Contacts, dentures or bridgework may not be worn into surgery.  Leave your suitcase in the car.  After surgery it may be brought to your room.  For patients admitted to the hospital, discharge time will be determined by your treatment team.  Patients discharged the day of surgery will not be allowed to drive home.   Name and phone number of your driver:   family Special instructions:  Follow the diet and prep instructions given to you by Dr Roseanne Kaufman office.  Please read over the following fact sheets that you were given. Anesthesia Post-op Instructions and Care and Recovery After Surgery       Esophagogastroduodenoscopy Esophagogastroduodenoscopy (EGD) is a procedure to examine the lining of the esophagus, stomach, and first part of the small intestine (duodenum). This procedure is done to check for problems such as inflammation, bleeding, ulcers, or growths. During this procedure, a long, flexible, lighted tube with a camera attached (endoscope) is inserted down the throat. Tell a health care provider about:  Any allergies you have.  All medicines you are taking, including vitamins, herbs, eye drops, creams, and over-the-counter medicines.  Any problems you or family members have had with anesthetic medicines.  Any blood  disorders you have.  Any surgeries you have had.  Any medical conditions you have.  Whether you are pregnant or may be pregnant. What are the risks? Generally, this is a safe procedure. However, problems may occur, including:  Infection.  Bleeding.  A tear (perforation) in the esophagus, stomach, or duodenum.  Trouble breathing.  Excessive sweating.  Spasms of the larynx.  A slowed heartbeat.  Low blood pressure.  What happens before the procedure?  Follow instructions from your health care provider about eating or drinking restrictions.  Ask your health care provider about: ? Changing or stopping your regular medicines. This is especially important if you are taking diabetes medicines or blood thinners. ? Taking medicines such as aspirin and ibuprofen. These medicines can thin your blood. Do not take these medicines before your procedure if your health care provider instructs you not to.  Plan to have someone take you home after the procedure.  If you wear dentures, be ready to remove them before the procedure. What happens during the procedure?  To reduce your risk of infection, your health care team will wash or sanitize their hands.  An IV tube will be put in a vein in your hand or arm. You will get medicines and fluids through this tube.  You will be given one or more of the following: ? A medicine to help you relax (sedative). ? A medicine to numb the area (local anesthetic). This medicine may  be sprayed into your throat. It will make you feel more comfortable and keep you from gagging or coughing during the procedure. ? A medicine for pain.  A mouth guard may be placed in your mouth to protect your teeth and to keep you from biting on the endoscope.  You will be asked to lie on your left side.  The endoscope will be lowered down your throat into your esophagus, stomach, and duodenum.  Air will be put into the endoscope. This will help your health care  provider see better.  The lining of your esophagus, stomach, and duodenum will be examined.  Your health care provider may: ? Take a tissue sample so it can be looked at in a lab (biopsy). ? Remove growths. ? Remove objects (foreign bodies) that are stuck. ? Treat any bleeding with medicines or other devices that stop tissue from bleeding. ? Widen (dilate) or stretch narrowed areas of your esophagus and stomach.  The endoscope will be taken out. The procedure may vary among health care providers and hospitals. What happens after the procedure?  Your blood pressure, heart rate, breathing rate, and blood oxygen level will be monitored often until the medicines you were given have worn off.  Do not eat or drink anything until the numbing medicine has worn off and your gag reflex has returned. This information is not intended to replace advice given to you by your health care provider. Make sure you discuss any questions you have with your health care provider. Document Released: 03/26/2005 Document Revised: 04/30/2016 Document Reviewed: 10/17/2015 Elsevier Interactive Patient Education  2018 [employer]. Esophagogastroduodenoscopy, Care After Refer to this sheet in the next few weeks. These instructions provide you with information about caring for yourself after your procedure. Your health care provider may also give you more specific instructions. Your treatment has been planned according to current medical practices, but problems sometimes occur. Call your health care provider if you have any problems or questions after your procedure. What can I expect after the procedure? After the procedure, it is common to have:  A sore throat.  Nausea.  Bloating.  Dizziness.  Fatigue.  Follow these instructions at home:  Do not eat or drink anything until the numbing medicine (local anesthetic) has worn off and your gag reflex has returned. You will know that the local anesthetic has worn  off when you can swallow comfortably.  Do not drive for 24 hours if you received a medicine to help you relax (sedative).  If your health care provider took a tissue sample for testing during the procedure, make sure to get your test results. This is your responsibility. Ask your health care provider or the department performing the test when your results will be ready.  Keep all follow-up visits as told by your health care provider. This is important. Contact a health care provider if:  You cannot stop coughing.  You are not urinating.  You are urinating less than usual. Get help right away if:  You have trouble swallowing.  You cannot eat or drink.  You have throat or chest pain that gets worse.  You are dizzy or light-headed.  You faint.  You have nausea or vomiting.  You have chills.  You have a fever.  You have severe abdominal pain.  You have black, tarry, or bloody stools. This information is not intended to replace advice given to you by your health care provider. Make sure you discuss any questions you have with  your health care provider. Document Released: 11/09/2012 Document Revised: 04/30/2016 Document Reviewed: 10/17/2015 Elsevier Interactive Patient Education  2018 [employer].  Colonoscopy, Adult A colonoscopy is an exam to look at the entire large intestine. During the exam, a lubricated, bendable tube is inserted into the anus and then passed into the rectum, colon, and other parts of the large intestine. A colonoscopy is often done as a part of normal colorectal screening or in response to certain symptoms, such as anemia, persistent diarrhea, abdominal pain, and blood in the stool. The exam can help screen for and diagnose medical problems, including:  Tumors.  Polyps.  Inflammation.  Areas of bleeding.  Tell a health care provider about:  Any allergies you have.  All medicines you are taking, including vitamins, herbs, eye drops, creams,  and over-the-counter medicines.  Any problems you or family members have had with anesthetic medicines.  Any blood disorders you have.  Any surgeries you have had.  Any medical conditions you have.  Any problems you have had passing stool. What are the risks? Generally, this is a safe procedure. However, problems may occur, including:  Bleeding.  A tear in the intestine.  A reaction to medicines given during the exam.  Infection (rare).  What happens before the procedure? Eating and drinking restrictions Follow instructions from your health care provider about eating and drinking, which may include:  A few days before the procedure - follow a low-fiber diet. Avoid nuts, seeds, dried fruit, raw fruits, and vegetables.  1-3 days before the procedure - follow a clear liquid diet. Drink only clear liquids, such as clear broth or bouillon, black coffee or tea, clear juice, clear soft drinks or sports drinks, gelatin dessert, and popsicles. Avoid any liquids that contain red or purple dye.  On the day of the procedure - do not eat or drink anything during the 2 hours before the procedure, or within the time period that your health care provider recommends.  Bowel prep If you were prescribed an oral bowel prep to clean out your colon:  Take it as told by your health care provider. Starting the day before your procedure, you will need to drink a large amount of medicated liquid. The liquid will cause you to have multiple loose stools until your stool is almost clear or light green.  If your skin or anus gets irritated from diarrhea, you may use these to relieve the irritation: ? Medicated wipes, such as adult wet wipes with aloe and vitamin E. ? A skin soothing-product like petroleum jelly.  If you vomit while drinking the bowel prep, take a break for up to 60 minutes and then begin the bowel prep again. If vomiting continues and you cannot take the bowel prep without vomiting, call  your health care provider.  General instructions  Ask your health care provider about changing or stopping your regular medicines. This is especially important if you are taking diabetes medicines or blood thinners.  Plan to have someone take you home from the hospital or clinic. What happens during the procedure?  An IV tube may be inserted into one of your veins.  You will be given medicine to help you relax (sedative).  To reduce your risk of infection: ? Your health care team will wash or sanitize their hands. ? Your anal area will be washed with soap.  You will be asked to lie on your side with your knees bent.  Your health care provider will lubricate a long,  thin, flexible tube. The tube will have a camera and a light on the end.  The tube will be inserted into your anus.  The tube will be gently eased through your rectum and colon.  Air will be delivered into your colon to keep it open. You may feel some pressure or cramping.  The camera will be used to take images during the procedure.  A small tissue sample may be removed from your body to be examined under a microscope (biopsy). If any potential problems are found, the tissue will be sent to a lab for testing.  If small polyps are found, your health care provider may remove them and have them checked for cancer cells.  The tube that was inserted into your anus will be slowly removed. The procedure may vary among health care providers and hospitals. What happens after the procedure?  Your blood pressure, heart rate, breathing rate, and blood oxygen level will be monitored until the medicines you were given have worn off.  Do not drive for 24 hours after the exam.  You may have a small amount of blood in your stool.  You may pass gas and have mild abdominal cramping or bloating due to the air that was used to inflate your colon during the exam.  It is up to you to get the results of your procedure. Ask your  health care provider, or the department performing the procedure, when your results will be ready. This information is not intended to replace advice given to you by your health care provider. Make sure you discuss any questions you have with your health care provider. Document Released: 11/20/2000 Document Revised: 09/23/2016 Document Reviewed: 02/04/2016 Elsevier Interactive Patient Education  2018 [employer].  Colonoscopy, Adult, Care After This sheet gives you information about how to care for yourself after your procedure. Your health care provider may also give you more specific instructions. If you have problems or questions, contact your health care provider. What can I expect after the procedure? After the procedure, it is common to have:  A small amount of blood in your stool for 24 hours after the procedure.  Some gas.  Mild abdominal cramping or bloating.  Follow these instructions at home: General instructions   For the first 24 hours after the procedure: ? Do not drive or use machinery. ? Do not sign important documents. ? Do not drink alcohol. ? Do your regular daily activities at a slower pace than normal. ? Eat soft, easy-to-digest foods. ? Rest often.  Take over-the-counter or prescription medicines only as told by your health care provider.  It is up to you to get the results of your procedure. Ask your health care provider, or the department performing the procedure, when your results will be ready. Relieving cramping and bloating  Try walking around when you have cramps or feel bloated.  Apply heat to your abdomen as told by your health care provider. Use a heat source that your health care provider recommends, such as a moist heat pack or a heating pad. ? Place a towel between your skin and the heat source. ? Leave the heat on for 20-30 minutes. ? Remove the heat if your skin turns bright red. This is especially important if you are unable to feel pain,  heat, or cold. You may have a greater risk of getting burned. Eating and drinking  Drink enough fluid to keep your urine clear or pale yellow.  Resume your normal diet as  instructed by your health care provider. Avoid heavy or fried foods that are hard to digest.  Avoid drinking alcohol for as long as instructed by your health care provider. Contact a health care provider if:  You have blood in your stool 2-3 days after the procedure. Get help right away if:  You have more than a small spotting of blood in your stool.  You pass large blood clots in your stool.  Your abdomen is swollen.  You have nausea or vomiting.  You have a fever.  You have increasing abdominal pain that is not relieved with medicine. This information is not intended to replace advice given to you by your health care provider. Make sure you discuss any questions you have with your health care provider. Document Released: 07/07/2004 Document Revised: 08/17/2016 Document Reviewed: 02/04/2016 Elsevier Interactive Patient Education  2018 [employer] Anesthesia is a term that refers to techniques, procedures, and medicines that help a person stay safe and comfortable during a medical procedure. Monitored anesthesia care, or sedation, is one type of anesthesia. Your anesthesia specialist may recommend sedation if you will be having a procedure that does not require you to be unconscious, such as:  Cataract surgery.  A dental procedure.  A biopsy.  A colonoscopy.  During the procedure, you may receive a medicine to help you relax (sedative). There are three levels of sedation:  Mild sedation. At this level, you may feel awake and relaxed. You will be able to follow directions.  Moderate sedation. At this level, you will be sleepy. You may not remember the procedure.  Deep sedation. At this level, you will be asleep. You will not remember the procedure.  The more medicine you  are given, the deeper your level of sedation will be. Depending on how you respond to the procedure, the anesthesia specialist may change your level of sedation or the type of anesthesia to fit your needs. An anesthesia specialist will monitor you closely during the procedure. Let your health care provider know about:  Any allergies you have.  All medicines you are taking, including vitamins, herbs, eye drops, creams, and over-the-counter medicines.  Any use of steroids (by mouth or as a cream).  Any problems you or family members have had with sedatives and anesthetic medicines.  Any blood disorders you have.  Any surgeries you have had.  Any medical conditions you have, such as sleep apnea.  Whether you are pregnant or may be pregnant.  Any use of cigarettes, alcohol, or street drugs. What are the risks? Generally, this is a safe procedure. However, problems may occur, including:  Getting too much medicine (oversedation).  Nausea.  Allergic reaction to medicines.  Trouble breathing. If this happens, a breathing tube may be used to help with breathing. It will be removed when you are awake and breathing on your own.  Heart trouble.  Lung trouble.  Before the procedure Staying hydrated Follow instructions from your health care provider about hydration, which may include:  Up to 2 hours before the procedure - you may continue to drink clear liquids, such as water, clear fruit juice, black coffee, and plain tea.  Eating and drinking restrictions Follow instructions from your health care provider about eating and drinking, which may include:  8 hours before the procedure - stop eating heavy meals or foods such as meat, fried foods, or fatty foods.  6 hours before the procedure - stop eating light meals or foods, such as  toast or cereal.  6 hours before the procedure - stop drinking milk or drinks that contain milk.  2 hours before the procedure - stop drinking clear  liquids.  Medicines Ask your health care provider about:  Changing or stopping your regular medicines. This is especially important if you are taking diabetes medicines or blood thinners.  Taking medicines such as aspirin and ibuprofen. These medicines can thin your blood. Do not take these medicines before your procedure if your health care provider instructs you not to.  Tests and exams  You will have a physical exam.  You may have blood tests done to show: ? How well your kidneys and liver are working. ? How well your blood can clot.  General instructions  Plan to have someone take you home from the hospital or clinic.  If you will be going home right after the procedure, plan to have someone with you for 24 hours.  What happens during the procedure?  Your blood pressure, heart rate, breathing, level of pain and overall condition will be monitored.  An IV tube will be inserted into one of your veins.  Your anesthesia specialist will give you medicines as needed to keep you comfortable during the procedure. This may mean changing the level of sedation.  The procedure will be performed. After the procedure  Your blood pressure, heart rate, breathing rate, and blood oxygen level will be monitored until the medicines you were given have worn off.  Do not drive for 24 hours if you received a sedative.  You may: ? Feel sleepy, clumsy, or nauseous. ? Feel forgetful about what happened after the procedure. ? Have a sore throat if you had a breathing tube during the procedure. ? Vomit. This information is not intended to replace advice given to you by your health care provider. Make sure you discuss any questions you have with your health care provider. Document Released: 08/19/2005 Document Revised: 05/01/2016 Document Reviewed: 03/15/2016 Elsevier Interactive Patient Education  2018 [place], Care After These instructions provide you with  information about caring for yourself after your procedure. Your health care provider may also give you more specific instructions. Your treatment has been planned according to current medical practices, but problems sometimes occur. Call your health care provider if you have any problems or questions after your procedure. What can I expect after the procedure? After your procedure, it is common to:  Feel sleepy for several hours.  Feel clumsy and have poor balance for several hours.  Feel forgetful about what happened after the procedure.  Have poor judgment for several hours.  Feel nauseous or vomit.  Have a sore throat if you had a breathing tube during the procedure.  Follow these instructions at home: For at least 24 hours after the procedure:   Do not: ? Participate in activities in which you could fall or become injured. ? Drive. ? Use heavy machinery. ? Drink alcohol. ? Take sleeping pills or medicines that cause drowsiness. ? Make important decisions or sign legal documents. ? Take care of children on your own.  Rest. Eating and drinking  Follow the diet that is recommended by your health care provider.  If you vomit, drink water, juice, or soup when you can drink without vomiting.  Make sure you have little or no nausea before eating solid foods. General instructions  Have a responsible adult stay with you until you are awake and alert.  Take over-the-counter and prescription medicines  only as told by your health care provider.  If you smoke, do not smoke without supervision.  Keep all follow-up visits as told by your health care provider. This is important. Contact a health care provider if:  You keep feeling nauseous or you keep vomiting.  You feel light-headed.  You develop a rash.  You have a fever. Get help right away if:  You have trouble breathing. This information is not intended to replace advice given to you by your health care provider.  Make sure you discuss any questions you have with your health care provider. Document Released: 03/15/2016 Document Revised: 07/15/2016 Document Reviewed: 03/15/2016 Elsevier Interactive Patient Education  [employer].

## 2017-07-28 ENCOUNTER — Ambulatory Visit: Payer: Commercial Managed Care - HMO | Admitting: Gastroenterology

## 2017-07-29 ENCOUNTER — Encounter (HOSPITAL_COMMUNITY)
Admission: RE | Admit: 2017-07-29 | Discharge: 2017-07-29 | Disposition: A | Discharge status: Home / Self Care | Payer: 59 | Source: Ambulatory Visit | Attending: Internal Medicine | Admitting: Internal Medicine

## 2017-07-29 ENCOUNTER — Encounter (HOSPITAL_COMMUNITY): Payer: Self-pay

## 2017-07-29 DIAGNOSIS — Z01812 Encounter for preprocedural laboratory examination: Secondary | ICD-10-CM | POA: Diagnosis not present

## 2017-07-29 HISTORY — DX: Anemia, unspecified: D64.9

## 2017-07-29 HISTORY — DX: Anxiety disorder, unspecified: F41.9

## 2017-07-29 LAB — BASIC METABOLIC PANEL
ANION GAP: 8 (ref 5–15)
BUN: 9 mg/dL (ref 6–20)
CHLORIDE: 108 mmol/L (ref 101–111)
CO2: 22 mmol/L (ref 22–32)
Calcium: 8.8 mg/dL — ABNORMAL LOW (ref 8.9–10.3)
Creatinine, Ser: 1.01 mg/dL — ABNORMAL HIGH (ref 0.44–1.00)
GFR calc non Af Amer: >60 mL/min (ref >60)
Glucose, Bld: 102 mg/dL — ABNORMAL HIGH (ref 65–99)
POTASSIUM: 4 mmol/L (ref 3.5–5.1)
SODIUM: 138 mmol/L (ref 135–145)

## 2017-07-29 LAB — CBC WITH DIFFERENTIAL/PLATELET
Basophils Absolute: 0 10*3/uL (ref 0.0–0.1)
Basophils Relative: 0 %
Eosinophils Absolute: 0.1 10*3/uL (ref 0.0–0.7)
Eosinophils Relative: 1 %
HEMATOCRIT: 37.8 % (ref 36.0–46.0)
HEMOGLOBIN: 12 g/dL (ref 12.0–15.0)
LYMPHS ABS: 1.9 10*3/uL (ref 0.7–4.0)
Lymphocytes Relative: 25 %
MCH: 23.6 pg — AB (ref 26.0–34.0)
MCHC: 31.7 g/dL (ref 30.0–36.0)
MCV: 74.3 fL — AB (ref 78.0–100.0)
MONO ABS: 0.6 10*3/uL (ref 0.1–1.0)
MONOS PCT: 8 %
NEUTROS ABS: 4.9 10*3/uL (ref 1.7–7.7)
NEUTROS PCT: 66 %
Platelets: 318 10*3/uL (ref 150–400)
RBC: 5.09 MIL/uL (ref 3.87–5.11)
RDW: 17 % — ABNORMAL HIGH (ref 11.5–15.5)
WBC: 7.5 10*3/uL (ref 4.0–10.5)

## 2017-07-29 LAB — HCG, SERUM, QUALITATIVE: PREG SERUM: NEGATIVE

## 2017-08-02 ENCOUNTER — Ambulatory Visit (HOSPITAL_COMMUNITY)
Admission: RE | Admit: 2017-08-02 | Discharge: 2017-08-02 | Disposition: A | Discharge status: Home / Self Care | Payer: 59 | Source: Ambulatory Visit | Attending: Internal Medicine | Admitting: Internal Medicine

## 2017-08-02 ENCOUNTER — Ambulatory Visit (HOSPITAL_COMMUNITY): Payer: 59 | Admitting: Anesthesiology

## 2017-08-02 ENCOUNTER — Encounter (HOSPITAL_COMMUNITY): Payer: Self-pay | Admitting: *Deleted

## 2017-08-02 ENCOUNTER — Encounter (HOSPITAL_COMMUNITY)
Admission: RE | Disposition: A | Discharge status: Home / Self Care | Payer: Self-pay | Source: Ambulatory Visit | Attending: Internal Medicine

## 2017-08-02 DIAGNOSIS — K219 Gastro-esophageal reflux disease without esophagitis: Secondary | ICD-10-CM | POA: Insufficient documentation

## 2017-08-02 DIAGNOSIS — Z791 Long term (current) use of non-steroidal anti-inflammatories (NSAID): Secondary | ICD-10-CM | POA: Diagnosis not present

## 2017-08-02 DIAGNOSIS — G8929 Other chronic pain: Secondary | ICD-10-CM | POA: Diagnosis not present

## 2017-08-02 DIAGNOSIS — K921 Melena: Secondary | ICD-10-CM | POA: Diagnosis not present

## 2017-08-02 DIAGNOSIS — R1013 Epigastric pain: Secondary | ICD-10-CM

## 2017-08-02 DIAGNOSIS — K640 First degree hemorrhoids: Secondary | ICD-10-CM | POA: Diagnosis not present

## 2017-08-02 DIAGNOSIS — Z79899 Other long term (current) drug therapy: Secondary | ICD-10-CM | POA: Diagnosis not present

## 2017-08-02 DIAGNOSIS — Z8379 Family history of other diseases of the digestive system: Secondary | ICD-10-CM | POA: Insufficient documentation

## 2017-08-02 DIAGNOSIS — K625 Hemorrhage of anus and rectum: Secondary | ICD-10-CM | POA: Diagnosis not present

## 2017-08-02 DIAGNOSIS — K449 Diaphragmatic hernia without obstruction or gangrene: Secondary | ICD-10-CM | POA: Diagnosis not present

## 2017-08-02 HISTORY — PX: ESOPHAGOGASTRODUODENOSCOPY (EGD) WITH PROPOFOL: SHX5813

## 2017-08-02 HISTORY — PX: COLONOSCOPY WITH PROPOFOL: SHX5780

## 2017-08-02 SURGERY — COLONOSCOPY WITH PROPOFOL
Anesthesia: Monitor Anesthesia Care

## 2017-08-02 MED ORDER — MIDAZOLAM HCL 5 MG/5ML IJ SOLN
INTRAMUSCULAR | Status: DC | PRN
  Administered 2017-08-02: 2 mg via INTRAVENOUS

## 2017-08-02 MED ORDER — LIDOCAINE VISCOUS 2 % MT SOLN
OROMUCOSAL | Status: DC | PRN
  Administered 2017-08-02: 4 mL via OROMUCOSAL

## 2017-08-02 MED ORDER — ONDANSETRON 4 MG PO TBDP
ORAL_TABLET | ORAL | Status: AC
Start: 2017-08-02 — End: 2017-08-02
  Filled 2017-08-02: qty 1

## 2017-08-02 MED ORDER — MIDAZOLAM HCL 2 MG/2ML IJ SOLN
INTRAMUSCULAR | Status: AC
  Filled 2017-08-02: qty 2

## 2017-08-02 MED ORDER — PROPOFOL 500 MG/50ML IV EMUL
INTRAVENOUS | Status: DC | PRN
  Administered 2017-08-02: 09:00:00 via INTRAVENOUS
  Administered 2017-08-02: 150 ug/kg/min via INTRAVENOUS
  Administered 2017-08-02: 10:00:00 via INTRAVENOUS

## 2017-08-02 MED ORDER — CHLORHEXIDINE GLUCONATE CLOTH 2 % EX PADS
6.0000 | MEDICATED_PAD | Freq: Once | CUTANEOUS | Status: DC
Start: 2017-08-02 — End: 2017-08-02

## 2017-08-02 MED ORDER — ONDANSETRON 4 MG PO TBDP
4.0000 mg | ORAL_TABLET | Freq: Once | ORAL | Status: AC
  Administered 2017-08-02: 4 mg via ORAL

## 2017-08-02 MED ORDER — PROPOFOL 10 MG/ML IV BOLUS
INTRAVENOUS | Status: AC
  Filled 2017-08-02: qty 40

## 2017-08-02 MED ORDER — LIDOCAINE HCL (CARDIAC) 10 MG/ML IV SOLN
INTRAVENOUS | Status: DC | PRN
  Administered 2017-08-02: 40 mg via INTRAVENOUS

## 2017-08-02 MED ORDER — LACTATED RINGERS IV SOLN
INTRAVENOUS | Status: DC
  Administered 2017-08-02: 09:00:00 via INTRAVENOUS

## 2017-08-02 MED ORDER — CHLORHEXIDINE GLUCONATE CLOTH 2 % EX PADS: 6.0000 | MEDICATED_PAD | Freq: Once | CUTANEOUS | Status: DC

## 2017-08-02 MED ORDER — LIDOCAINE VISCOUS 2 % MT SOLN
OROMUCOSAL | Status: AC
  Filled 2017-08-02: qty 15

## 2017-08-02 MED ORDER — MIDAZOLAM HCL 2 MG/2ML IJ SOLN
1.0000 mg | Freq: Once | INTRAMUSCULAR | Status: AC | PRN
  Administered 2017-08-02: 2 mg via INTRAVENOUS

## 2017-08-02 MED ORDER — LIDOCAINE VISCOUS 2 % MT SOLN: 15.0000 mL | Freq: Once | OROMUCOSAL | Status: DC

## 2017-08-02 NOTE — Anesthesia Postprocedure Evaluation (Signed)
Anesthesia Post Note  Patient: Melissa Livingston  Procedure(s) Performed: Procedure(s) (LRB): COLONOSCOPY WITH PROPOFOL (N/A) ESOPHAGOGASTRODUODENOSCOPY (EGD) WITH PROPOFOL (N/A)  Patient location during evaluation: PACU Anesthesia Type: MAC Level of consciousness: oriented, awake and patient cooperative Pain management: pain level controlled Vital Signs Assessment: post-procedure vital signs reviewed and stable Respiratory status: spontaneous breathing and patient connected to face mask oxygen Cardiovascular status: stable Postop Assessment: no signs of nausea or vomiting Anesthetic complications: no     Last Vitals:  Vitals:   08/02/17 0905 08/02/17 0910  BP: 111/75   Pulse:    Resp: 16 19  Temp:    SpO2: 100% 100%    Last Pain:  Vitals:   08/02/17 0913  TempSrc:   PainSc: 4                  Andrell Bergeson A

## 2017-08-02 NOTE — Anesthesia Procedure Notes (Signed)
Procedure Name: MAC Date/Time: 08/02/2017 9:11 AM Performed by: Andree Elk, AMY A Pre-anesthesia Checklist: Patient identified, Emergency Drugs available, Suction available, Patient being monitored and Timeout performed Oxygen Delivery Method: Simple face mask

## 2017-08-02 NOTE — Op Note (Signed)
[facility] Patient Name: Melissa Livingston Procedure Date: 08/02/2017 9:31 AM MRN: [id number] Date of Birth: 12/01/1996 Attending MD: Norvel Richards , MD CSN: [id number] Age: 21 Admit Type: Outpatient Procedure:                Colonoscopy Indications:              Hematochezia Providers:                Norvel Richards, MD, Jeanann Lewandowsky. Sharon Seller, RN,                            Aram Candela Referring MD:              Medicines:                Propofol per Anesthesia Complications:            No immediate complications. Estimated Blood Loss:     Estimated blood loss: none. Procedure:                Pre-Anesthesia Assessment:                           - Prior to the procedure, a History and Physical                            was performed, and patient medications and                            allergies were reviewed. The patient's tolerance of                            previous anesthesia was also reviewed. The risks                            and benefits of the procedure and the sedation                            options and risks were discussed with the patient.                            All questions were answered, and informed consent                            was obtained. Prior Anticoagulants: The patient has                            taken no previous anticoagulant or antiplatelet                            agents. ASA Grade Assessment: II - A patient with                            mild systemic disease. After reviewing the risks  and benefits, the patient was deemed in                            satisfactory condition to undergo the procedure.                           After obtaining informed consent, the colonoscope                            was passed under direct vision. Throughout the                            procedure, the patient's blood pressure, pulse, and                            oxygen saturations were monitored  continuously. The                            [id number]) scope was introduced through the                            and advanced to the 5 cm into the ileum. The                            terminal ileum, ileocecal valve, appendiceal                            orifice, and rectum were photographed. The quality                            of the bowel preparation was adequate. Scope In: 9:37:08 AM Scope Out: 9:50:37 AM Scope Withdrawal Time: 0 hours 6 minutes 30 seconds  Total Procedure Duration: 0 hours 13 minutes 29 seconds  Findings:      The perianal and digital rectal examinations were normal.      Internal hemorrhoids were found during retroflexion. The hemorrhoids       were moderate and Grade I (internal hemorrhoids that do not prolapse).       The distal 5 cm of terminal ileal mucosa also appeared normal.      The exam was otherwise without abnormality on direct and retroflexion       views. Impression:               - Internal hemorrhoids.                           - The examination was otherwise normal on direct                            and retroflexion views.                           - No specimens collected. Moderate Sedation:      Moderate (conscious) sedation was personally administered by an       anesthesia professional. The following parameters were monitored: oxygen  saturation, heart rate, blood pressure, respiratory rate, EKG, adequacy       of pulmonary ventilation, and response to care. Total physician       intraservice time was 34 minutes. Recommendation:           - Patient has a contact number available for                            emergencies. The signs and symptoms of potential                            delayed complications were discussed with the                            patient. Return to normal activities tomorrow.                            Written discharge instructions were provided to the                            patient.                            - Resume previous diet.                           - Continue present medications. Anusol HC cream to                            the anorectum 4 times a day. See EGD report.                           - No repeat colonoscopy due to no evidence of                            mucosal or other abnormalities on today's exam.                           - Return to GI clinic in 6 weeks. Procedure Code(s):        --- Professional ---                           [phone], Colonoscopy, flexible; diagnostic, including                            collection of specimen(s) by brushing or washing,                            when performed (separate procedure) Diagnosis Code(s):        --- Professional ---                           K64.0, First degree hemorrhoids                           K92.1, Melena (includes Hematochezia)  CPT copyright 2016 [employer]. All rights reserved. The codes documented in this report are preliminary and upon coder review may  be revised to meet current compliance requirements. Cristopher Estimable. Rogina Schiano, MD Norvel Richards, MD 08/02/2017 9:55:16 AM This report has been signed electronically. Number of Addenda: 0

## 2017-08-02 NOTE — Anesthesia Preprocedure Evaluation (Signed)
Anesthesia Evaluation  Patient identified by MRN, date of birth, ID band Patient awake    Airway Mallampati: I  TM Distance: >3 FB Neck ROM: Full    Dental  (+) Teeth Intact   Pulmonary asthma (well controlled) ,    Pulmonary exam normal breath sounds clear to auscultation       Cardiovascular Exercise Tolerance: Good Normal cardiovascular exam Rhythm:Regular Rate:Normal     Neuro/Psych    GI/Hepatic GERD  Controlled and Medicated,  Endo/Other    Renal/GU Recent Results (from the past 2160 hour(s)) -Lipase     Status: None Collection Time: 06/30/17 12:11 PM      Result                                            Value                         Ref Range                      Lipase                                            32                            14 - 72 U/L               -Hepatic function panel     Status: None Collection Time: 06/30/17 12:11 PM      Result                                            Value                         Ref Range                      Total Protein                                     7.3                           6.0 - 8.5 g/dL                 Albumin                                           4.2                           3.5 - 5.5 g/dL                 Bilirubin Total  0.6                           0.0 - 1.2 mg/dL                Bilirubin, Direct                                 0.12                          0.00 - 0.40 mg/dL              Alkaline Phosphatase                              107                           39 - 117 IU/L                  AST                                               16                            0 - 40 IU/L                    ALT                                               10                            0 - 32 IU/L               -CBC with Differential/Platelet     Status: Abnormal Collection Time: 06/30/17 12:11 PM      Result                                             Value                         Ref Range                      WBC                                               7.9                           3.4 - 10.8 x10E3/uL            RBC  5.33 (H)                      3.77 - 5.28 x10E6/uL           Hemoglobin                                        12.5                          11.1 - 15.9 g/dL               Hematocrit                                        39.6                          34.0 - 46.6 %                  MCV                                               74 (L)                        79 - 97 fL                     MCH                                               23.5 (L)                      26.6 - 33.0 pg                 MCHC                                              31.6                          31.5 - 35.7 g/dL               RDW                                               17.3 (H)                      12.3 - 15.4 %                  Platelets  343                           150 - 379 x10E3/uL             Neutrophils                                       82                            Not Estab. %                   Lymphs                                            11                            Not Estab. %                   Monocytes                                         6                             Not Estab. %                   Eos                                               1                             Not Estab. %                   Basos                                             0                             Not Estab. %                   Neutrophils Absolute                              6.6                           1.4 - 7.0 x10E3/uL             Lymphocytes Absolute  0.8                           0.7 - 3.1 x10E3/uL             Monocytes Absolute                                0.5                            0.1 - 0.9 x10E3/uL             EOS (ABSOLUTE)                                    0.0                           0.0 - 0.4 x10E3/uL             Basophils Absolute                                0.0                           0.0 - 0.2 x10E3/uL             Immature Granulocytes                             0                             Not Estab. %                   Immature Grans (Abs)                              0.0                           0.0 - 0.1 x10E3/uL        -Sed Rate (ESR)     Status: None Collection Time: 06/30/17 12:11 PM      Result                                            Value                         Ref Range                      Sed Rate                                          7  0 - 32 mm/hr              -Tissue Transglutaminase, IGA     Status: None Collection Time: 06/30/17 12:11 PM      Result                                            Value                         Ref Range                      Transglutaminase IgA                              <2                            0 - 3 U/mL                  Comment:                                 Negative        0 -  3                                 Weak Positive   4 - 10                                 Positive           >10    Tissue Transglutaminase (tTG) has been identified    as the endomysial antigen.  Studies have demonstr-    ated that endomysial IgA antibodies have over 99%    specificity for gluten sensitive enteropathy.    -CBC with Differential/Platelet     Status: Abnormal Collection Time: 07/29/17  1:55 PM      Result                                            Value                         Ref Range                      WBC                                               7.5                           4.0 - 10.5 K/uL                RBC  5.09                          3.87 - 5.11 MIL/uL             Hemoglobin                                         12.0                          12.0 - 15.0 g/dL               HCT                                               37.8                          36.0 - 46.0 %                  MCV                                               74.3 (L)                      78.0 - 100.0 fL                MCH                                               23.6 (L)                      26.0 - 34.0 pg                 MCHC                                              31.7                          30.0 - 36.0 g/dL               RDW                                               17.0 (H)                      11.5 - 15.5 %                  Platelets  318                           150 - 400 K/uL                 Neutrophils Relative %                            66                            %                              Neutro Abs                                        4.9                           1.7 - 7.7 K/uL                 Lymphocytes Relative                              25                            %                              Lymphs Abs                                        1.9                           0.7 - 4.0 K/uL                 Monocytes Relative                                8                             %                              Monocytes Absolute                                0.6                           0.1 - 1.0 K/uL                 Eosinophils Relative  1                             %                              Eosinophils Absolute                              0.1                           0.0 - 0.7 K/uL                 Basophils Relative                                0                             %                              Basophils Absolute                                0.0                           0.0 - 0.1 K/uL            -Basic metabolic panel     Status: Abnormal Collection Time: 07/29/17  1:55  PM      Result                                            Value                         Ref Range                      Sodium                                            138                           135 - 145 mmol/L               Potassium                                         4.0                           3.5 - 5.1 mmol/L               Chloride  108                           101 - 111 mmol/L               CO2                                               22                            22 - 32 mmol/L                 Glucose, Bld                                      102 (H)                       65 - 99 mg/dL                  BUN                                               9                             6 - 20 mg/dL                   Creatinine, Ser                                   1.01 (H)                      0.44 - 1.00 mg/dL              Calcium                                           8.8 (L)                       8.9 - 10.3 mg/dL               GFR calc non Af Amer                              >60                           >60 mL/min                     GFR calc Af Amer                                  >  60                           >60 mL/min                  Comment:   (NOTE)   The eGFR has been calculated using the CKD EPI equation.   This calculation has not been validated in all clinical situations.   eGFR's persistently <60 mL/min signify possible Chronic Kidney   Disease.         Anion gap                                         8                             5 - 15                    -hCG, serum, qualitative     Status: None Collection Time: 07/29/17  1:55 PM      Result                                            Value                         Ref Range                      Preg, Serum                                       NEGATIVE                      NEGATIVE                    Comment:            THE SENSITIVITY OF THIS   METHODOLOGY  IS >10 mIU/mL.        Musculoskeletal   Abdominal (+)  Abdomen: soft.    Peds  Hematology   Anesthesia Other Findings   Reproductive/Obstetrics                             Anesthesia Physical Anesthesia Plan  ASA: II  Anesthesia Plan: MAC   Post-op Pain Management:    Induction: Intravenous  PONV Risk Score and Plan:   Airway Management Planned: Mask and Natural Airway  Additional Equipment:   Intra-op Plan:   Post-operative Plan:   Informed Consent: I have reviewed the patients History and Physical, chart, labs and discussed the procedure including the risks, benefits and alternatives for the proposed anesthesia with the patient or authorized representative who has indicated his/her understanding and acceptance.   Dental advisory given  Plan Discussed with:   Anesthesia Plan Comments:         Anesthesia Quick Evaluation

## 2017-08-02 NOTE — Op Note (Signed)
[facility] Patient Name: Melissa Livingston Procedure Date: 08/02/2017 8:55 AM MRN: [id number] Date of Birth: 03-May-1996 Attending MD: Norvel Richards , MD CSN: [id number] Age: 21 Admit Type: Outpatient Procedure:                Upper GI endoscopy Indications:              Epigastric abdominal pain Providers:                Norvel Richards, MD, Otis Peak B. Sharon Seller, RN,                            Aram Candela Referring MD:              Medicines:                Propofol per Anesthesia Complications:            No immediate complications. Estimated Blood Loss:     Estimated blood loss: none. Procedure:                Pre-Anesthesia Assessment:                           - Prior to the procedure, a History and Physical                            was performed, and patient medications and                            allergies were reviewed. The patient's tolerance of                            previous anesthesia was also reviewed. The risks                            and benefits of the procedure and the sedation                            options and risks were discussed with the patient.                            All questions were answered, and informed consent                            was obtained. Prior Anticoagulants: The patient has                            taken no previous anticoagulant or antiplatelet                            agents. ASA Grade Assessment: II - A patient with                            mild systemic disease. After reviewing the risks  and benefits, the patient was deemed in                            satisfactory condition to undergo the procedure.                           After obtaining informed consent, the endoscope was                            passed under direct vision. Throughout the                            procedure, the patient's blood pressure, pulse, and                            oxygen saturations were  monitored continuously. The                            [id number]) scope was introduced through the                            and advanced to the second part of duodenum. The                            upper GI endoscopy was accomplished without                            difficulty. The patient tolerated the procedure                            well. Scope In: 9:22:08 AM Scope Out: 9:31:08 AM Total Procedure Duration: 0 hours 9 minutes 0 seconds  Findings:      The examined esophagus was normal.      A small hiatal hernia was present.      The duodenal bulb and second portion of the duodenum were normal. Impression:               - Normal esophagus.                           - Small hiatal hernia.                           - Normal duodenal bulb and second portion of the                            duodenum.                           - No specimens collected. Moderate Sedation:      Moderate (conscious) sedation was personally administered by an       anesthesia professional. The following parameters were monitored: oxygen       saturation, heart rate, blood pressure, respiratory rate, EKG, adequacy       of pulmonary ventilation, and response to care. Total physician       intraservice  time was 15 minutes. Recommendation:           - Patient has a contact number available for                            emergencies. The signs and symptoms of potential                            delayed complications were discussed with the                            patient. Return to normal activities tomorrow.                            Written discharge instructions were provided to the                            patient.                           - Resume previous diet.                           - Continue present medications. Continue Dexilant                            60 mg daily. See colonoscopy report.                           - Return to GI office in 6 weeks. Procedure Code(s):         --- Professional ---                           [phone], Esophagogastroduodenoscopy, flexible,                            transoral; diagnostic, including collection of                            specimen(s) by brushing or washing, when performed                            (separate procedure) Diagnosis Code(s):        --- Professional ---                           K44.9, Diaphragmatic hernia without obstruction or                            gangrene                           R10.13, Epigastric pain CPT copyright 2016 [employer]. All rights reserved. The codes documented in this report are preliminary and upon coder review may  be revised to meet current compliance requirements. Cristopher Estimable. Charmion Hapke, MD Norvel Richards, MD 08/02/2017 9:35:38 AM This report has been signed electronically. Number  of Addenda: 0

## 2017-08-02 NOTE — Discharge Instructions (Signed)
°Colonoscopy °Discharge Instructions ° °Read the instructions outlined below and refer to this sheet in the next few weeks. These discharge instructions provide you with general information on caring for yourself after you leave the hospital. Your doctor may also give you specific instructions. While your treatment has been planned according to the most current medical practices available, unavoidable complications occasionally occur. If you have any problems or questions after discharge, call Dr. Rourk at [phone]. °ACTIVITY °· You may resume your regular activity, but move at a slower pace for the next 24 hours.  °· Take frequent rest periods for the next 24 hours.  °· Walking will help get rid of the air and reduce the bloated feeling in your belly (abdomen).  °· No driving for 24 hours (because of the medicine (anesthesia) used during the test).   °· Do not sign any important legal documents or operate any machinery for 24 hours (because of the anesthesia used during the test).  °NUTRITION °· Drink plenty of fluids.  °· You may resume your normal diet as instructed by your doctor.  °· Begin with a light meal and progress to your normal diet. Heavy or fried foods are harder to digest and may make you feel sick to your stomach (nauseated).  °· Avoid alcoholic beverages for 24 hours or as instructed.  °MEDICATIONS °· You may resume your normal medications unless your doctor tells you otherwise.  °WHAT YOU CAN EXPECT TODAY °· Some feelings of bloating in the abdomen.  °· Passage of more gas than usual.  °· Spotting of blood in your stool or on the toilet paper.  °IF YOU HAD POLYPS REMOVED DURING THE COLONOSCOPY: °· No aspirin products for 7 days or as instructed.  °· No alcohol for 7 days or as instructed.  °· Eat a soft diet for the next 24 hours.  °FINDING OUT THE RESULTS OF YOUR TEST °Not all test results are available during your visit. If your test results are not back during the visit, make an appointment  with your caregiver to find out the results. Do not assume everything is normal if you have not heard from your caregiver or the medical facility. It is important for you to follow up on all of your test results.  °SEEK IMMEDIATE MEDICAL ATTENTION IF: °· You have more than a spotting of blood in your stool.  °· Your belly is swollen (abdominal distention).  °· You are nauseated or vomiting.  °· You have a temperature over 101.  °· You have abdominal pain or discomfort that is severe or gets worse throughout the day.  °EGD °Discharge instructions °Please read the instructions outlined below and refer to this sheet in the next few weeks. These discharge instructions provide you with general information on caring for yourself after you leave the hospital. Your doctor may also give you specific instructions. While your treatment has been planned according to the most current medical practices available, unavoidable complications occasionally occur. If you have any problems or questions after discharge, please call your doctor. °ACTIVITY °· You may resume your regular activity but move at a slower pace for the next 24 hours.  °· Take frequent rest periods for the next 24 hours.  °· Walking will help expel (get rid of) the air and reduce the bloated feeling in your abdomen.  °· No driving for 24 hours (because of the anesthesia (medicine) used during the test).  °· You may shower.  °· Do not sign any important   legal documents or operate any machinery for 24 hours (because of the anesthesia used during the test).  NUTRITION  Drink plenty of fluids.   You may resume your normal diet.   Begin with a light meal and progress to your normal diet.   Avoid alcoholic beverages for 24 hours or as instructed by your caregiver.  MEDICATIONS  You may resume your normal medications unless your caregiver tells you otherwise.  WHAT YOU CAN EXPECT TODAY  You may experience abdominal discomfort such as a feeling of fullness  or gas pains.  FOLLOW-UP  Your doctor will discuss the results of your test with you.  SEEK IMMEDIATE MEDICAL ATTENTION IF ANY OF THE FOLLOWING OCCUR:  Excessive nausea (feeling sick to your stomach) and/or vomiting.   Severe abdominal pain and distention (swelling).   Trouble swallowing.   Temperature over 101 F (37.8 C).   Rectal bleeding or vomiting of blood.     GERD and hiatal hernia information provided  Hemorrhoid information provided  Continue Dexilant 60 mg daily  Anusol HC cream applied to the anorectum 4 times a day  Office visit with [place] in 6 weeks     Gastroesophageal Reflux Disease, Adult Normally, food travels down the esophagus and stays in the stomach to be digested. If a person has gastroesophageal reflux disease (GERD), food and stomach acid move back up into the esophagus. When this happens, the esophagus becomes sore and swollen (inflamed). Over time, GERD can make small holes (ulcers) in the lining of the esophagus. Follow these instructions at home: Diet  Follow a diet as told by your doctor. You may need to avoid foods and drinks such as: ? Coffee and tea (with or without caffeine). ? Drinks that contain alcohol. ? Energy drinks and sports drinks. ? Carbonated drinks or sodas. ? Chocolate and cocoa. ? Peppermint and mint flavorings. ? Garlic and onions. ? Horseradish. ? Spicy and acidic foods, such as peppers, chili powder, curry powder, vinegar, hot sauces, and BBQ sauce. ? Citrus fruit juices and citrus fruits, such as oranges, lemons, and limes. ? Tomato-based foods, such as red sauce, chili, salsa, and pizza with red sauce. ? Fried and fatty foods, such as donuts, french fries, potato chips, and high-fat dressings. ? High-fat meats, such as hot dogs, rib eye steak, sausage, ham, and bacon. ? High-fat dairy items, such as whole milk, butter, and cream cheese.  Eat small meals often. Avoid eating large meals.  Avoid drinking large  amounts of liquid with your meals.  Avoid eating meals during the 2-3 hours before bedtime.  Avoid lying down right after you eat.  Do not exercise right after you eat. General instructions  Pay attention to any changes in your symptoms.  Take over-the-counter and prescription medicines only as told by your doctor. Do not take aspirin, ibuprofen, or other NSAIDs unless your doctor says it is okay.  Do not use any tobacco products, including cigarettes, chewing tobacco, and e-cigarettes. If you need help quitting, ask your doctor.  Wear loose clothes. Do not wear anything tight around your waist.  Raise (elevate) the head of your bed about 6 inches (15 cm).  Try to lower your stress. If you need help doing this, ask your doctor.  If you are overweight, lose an amount of weight that is healthy for you. Ask your doctor about a safe weight loss goal.  Keep all follow-up visits as told by your doctor. This is important. Contact a doctor if:  You have new symptoms.  You lose weight and you do not know why it is happening.  You have trouble swallowing, or it hurts to swallow.  You have wheezing or a cough that keeps happening.  Your symptoms do not get better with treatment.  You have a hoarse voice. Get help right away if:  You have pain in your arms, neck, jaw, teeth, or back.  You feel sweaty, dizzy, or light-headed.  You have chest pain or shortness of breath.  You throw up (vomit) and your throw up looks like blood or coffee grounds.  You pass out (faint).  Your poop (stool) is bloody or black.  You cannot swallow, drink, or eat. This information is not intended to replace advice given to you by your health care provider. Make sure you discuss any questions you have with your health care provider. Document Released: 05/11/2008 Document Revised: 04/30/2016 Document Reviewed: 03/20/2015 Elsevier Interactive Patient Education  2018 [employer].    Hiatal  Hernia A hiatal hernia occurs when part of the stomach slides above the muscle that separates the abdomen from the chest (diaphragm). A person can be born with a hiatal hernia (congenital), or it may develop over time. In almost all cases of hiatal hernia, only the top part of the stomach pushes through the diaphragm. Many people have a hiatal hernia with no symptoms. The larger the hernia, the more likely it is that you will have symptoms. In some cases, a hiatal hernia allows stomach acid to flow back into the tube that carries food from your mouth to your stomach (esophagus). This may cause heartburn symptoms. Severe heartburn symptoms may mean that you have developed a condition called gastroesophageal reflux disease (GERD). What are the causes? This condition is caused by a weakness in the opening (hiatus) where the esophagus passes through the diaphragm to attach to the upper part of the stomach. A person may be born with a weakness in the hiatus, or a weakness can develop over time. What increases the risk? This condition is more likely to develop in:  Older people. Age is a major risk factor for a hiatal hernia, especially if you are over the age of 67.  Pregnant women.  People who are overweight.  People who have frequent constipation.  What are the signs or symptoms? Symptoms of this condition usually develop in the form of GERD symptoms. Symptoms include:  Heartburn.  Belching.  Indigestion.  Trouble swallowing.  Coughing or wheezing.  Sore throat.  Hoarseness.  Chest pain.  Nausea and vomiting.  How is this diagnosed? This condition may be diagnosed during testing for GERD. Tests that may be done include:  X-rays of your stomach or chest.  An upper gastrointestinal (GI) series. This is an X-ray exam of your GI tract that is taken after you swallow a chalky liquid that shows up clearly on the X-ray.  Endoscopy. This is a procedure to look into your stomach using  a thin, flexible tube that has a tiny camera and light on the end of it.  How is this treated? This condition may be treated by:  Dietary and lifestyle changes to help reduce GERD symptoms.  Medicines. These may include: ? Over-the-counter antacids. ? Medicines that make your stomach empty more quickly. ? Medicines that block the production of stomach acid (H2 blockers). ? Stronger medicines to reduce stomach acid (proton pump inhibitors).  Surgery to repair the hernia, if other treatments are not helping.  If you  have no symptoms, you may not need treatment. Follow these instructions at home: Lifestyle and activity  Do not use any products that contain nicotine or tobacco, such as cigarettes and e-cigarettes. If you need help quitting, ask your health care provider.  Try to achieve and maintain a healthy body weight.  Avoid putting pressure on your abdomen. Anything that puts pressure on your abdomen increases the amount of acid that may be pushed up into your esophagus. ? Avoid bending over, especially after eating. ? Raise the head of your bed by putting blocks under the legs. This keeps your head and esophagus higher than your stomach. ? Do not wear tight clothing around your chest or stomach. ? Try not to strain when having a bowel movement, when urinating, or when lifting heavy objects. Eating and drinking  Avoid foods that can worsen GERD symptoms. These may include: ? Fatty foods, like fried foods. ? Citrus fruits, like oranges or lemon. ? Other foods and drinks that contain acid, like orange juice or tomatoes. ? Spicy food. ? Chocolate.  Eat frequent small meals instead of three large meals a day. This helps prevent your stomach from getting too full. ? Eat slowly. ? Do not lie down right after eating. ? Do not eat 1-2 hours before bed.  Do not drink beverages with caffeine. These include cola, coffee, cocoa, and tea.  Do not drink alcohol. General  instructions  Take over-the-counter and prescription medicines only as told by your health care provider.  Keep all follow-up visits as told by your health care provider. This is important. Contact a health care provider if:  Your symptoms are not controlled with medicines or lifestyle changes.  You are having trouble swallowing.  You have coughing or wheezing that will not go away. Get help right away if:  Your pain is getting worse.  Your pain spreads to your arms, neck, jaw, teeth, or back.  You have shortness of breath.  You sweat for no reason.  You feel sick to your stomach (nauseous) or you vomit.  You vomit blood.  You have bright red blood in your stools.  You have black, tarry stools. This information is not intended to replace advice given to you by your health care provider. Make sure you discuss any questions you have with your health care provider. Document Released: 02/13/2004 Document Revised: 11/16/2016 Document Reviewed: 11/16/2016 Elsevier Interactive Patient Education  2018 [employer].    Hemorrhoids Hemorrhoids are swollen veins in and around the rectum or anus. There are two types of hemorrhoids:  Internal hemorrhoids. These occur in the veins that are just inside the rectum. They may poke through to the outside and become irritated and painful.  External hemorrhoids. These occur in the veins that are outside of the anus and can be felt as a painful swelling or hard lump near the anus.  Most hemorrhoids do not cause serious problems, and they can be managed with home treatments such as diet and lifestyle changes. If home treatments do not help your symptoms, procedures can be done to shrink or remove the hemorrhoids. What are the causes? This condition is caused by increased pressure in the anal area. This pressure may result from various things, including:  Constipation.  Straining to have a bowel  movement.  Diarrhea.  Pregnancy.  Obesity.  Sitting for long periods of time.  Heavy lifting or other activity that causes you to strain.  Anal sex.  What are the signs or symptoms?  Symptoms of this condition include:  Pain.  Anal itching or irritation.  Rectal bleeding.  Leakage of stool (feces).  Anal swelling.  One or more lumps around the anus.  How is this diagnosed? This condition can often be diagnosed through a visual exam. Other exams or tests may also be done, such as:  Examination of the rectal area with a gloved hand (digital rectal exam).  Examination of the anal canal using a small tube (anoscope).  A blood test, if you have lost a significant amount of blood.  A test to look inside the colon (sigmoidoscopy or colonoscopy).  How is this treated? This condition can usually be treated at home. However, various procedures may be done if dietary changes, lifestyle changes, and other home treatments do not help your symptoms. These procedures can help make the hemorrhoids smaller or remove them completely. Some of these procedures involve surgery, and others do not. Common procedures include:  Rubber band ligation. Rubber bands are placed at the base of the hemorrhoids to cut off the blood supply to them.  Sclerotherapy. Medicine is injected into the hemorrhoids to shrink them.  Infrared coagulation. A type of light energy is used to get rid of the hemorrhoids.  Hemorrhoidectomy surgery. The hemorrhoids are surgically removed, and the veins that supply them are tied off.  Stapled hemorrhoidopexy surgery. A circular stapling device is used to remove the hemorrhoids and use staples to cut off the blood supply to them.  Follow these instructions at home: Eating and drinking  Eat foods that have a lot of fiber in them, such as whole grains, beans, nuts, fruits, and vegetables. Ask your health care provider about taking products that have added fiber (fiber  supplements).  Drink enough fluid to keep your urine clear or pale yellow. Managing pain and swelling  Take warm sitz baths for 20 minutes, 3-4 times a day to ease pain and discomfort.  If directed, apply ice to the affected area. Using ice packs between sitz baths may be helpful. ? Put ice in a plastic bag. ? Place a towel between your skin and the bag. ? Leave the ice on for 20 minutes, 2-3 times a day. General instructions  Take over-the-counter and prescription medicines only as told by your health care provider.  Use medicated creams or suppositories as told.  Exercise regularly.  Go to the bathroom when you have the urge to have a bowel movement. Do not wait.  Avoid straining to have bowel movements.  Keep the anal area dry and clean. Use wet toilet paper or moist towelettes after a bowel movement.  Do not sit on the toilet for long periods of time. This increases blood pooling and pain. Contact a health care provider if:  You have increasing pain and swelling that are not controlled by treatment or medicine.  You have uncontrolled bleeding.  You have difficulty having a bowel movement, or you are unable to have a bowel movement.  You have pain or inflammation outside the area of the hemorrhoids. This information is not intended to replace advice given to you by your health care provider. Make sure you discuss any questions you have with your health care provider. Document Released: 11/20/2000 Document Revised: 04/22/2016 Document Reviewed: 08/07/2015 Elsevier Interactive Patient Education  2017 [employer].

## 2017-08-02 NOTE — Transfer of Care (Signed)
Immediate Anesthesia Transfer of Care Note  Patient: [place]  Procedure(s) Performed: Procedure(s) with comments: COLONOSCOPY WITH PROPOFOL (N/A) - 930  ESOPHAGOGASTRODUODENOSCOPY (EGD) WITH PROPOFOL (N/A)  Patient Location: PACU  Anesthesia Type:MAC  Level of Consciousness: awake, oriented and patient cooperative  Airway & Oxygen Therapy: Patient Spontanous Breathing and Patient connected to face mask oxygen  Post-op Assessment: Report given to RN and Post -op Vital signs reviewed and stable  Post vital signs: Reviewed and stable  Last Vitals:  Vitals:   08/02/17 0905 08/02/17 0910  BP: 111/75   Pulse:    Resp: 16 19  Temp:    SpO2: 100% 100%    Last Pain:  Vitals:   08/02/17 0913  TempSrc:   PainSc: 4       Patients Stated Pain Goal: 9 (32/35/57 [id number])  Complications: No apparent anesthesia complications

## 2017-08-02 NOTE — Interval H&P Note (Signed)
History and Physical Interval Note:  08/02/2017 9:07 AM  Melissa [place]  has presented today for surgery, with the diagnosis of RECTAL BLEEDING/EPIG PAIN  The various methods of treatment have been discussed with the patient and family. After consideration of risks, benefits and other options for treatment, the patient has consented to  Procedure(s) with comments: COLONOSCOPY WITH PROPOFOL (N/A) - 930  ESOPHAGOGASTRODUODENOSCOPY (EGD) WITH PROPOFOL (N/A) as a surgical intervention .  The patient's history has been reviewed, patient examined, no change in status, stable for surgery.  I have reviewed the patient's chart and labs.  Questions were answered to the patient's satisfaction.     Manus Rudd  With Epigastric Pain. No Dysphagia. EGD and Colonoscopy Today for Further Evaluation.  The risks, benefits, limitations, imponderables and alternatives regarding both EGD and colonoscopy have been reviewed with the patient. Questions have been answered. All parties agreeable.

## 2017-08-02 NOTE — H&P (View-Only) (Signed)
    Primary Care Physician: Luking, Scott A, MD  Primary Gastroenterologist:  Michael Rourk, MD   Chief Complaint  Patient presents with  . Abdominal Pain    HPI: Melissa Livingston is a 21 y.o. female hereFor follow-up of acute on chronic abdominal pain. Symptoms have been worse for the past 1 month. We initially met the patient back in 2014, at that time was having similar symptoms. On Relafen for knee injury at the time. She had an EGD which was unremarkable. Patient and mother had refused colonoscopy at the time which was offered due to change in bowel habits and bright red blood per rectum. She was seen back in follow-up a couple years later, mid 2016, colonoscopy scheduled but prior to having this done she ended up acutely ill in the ICU with bilateral pyelonephritis/sepsis.  Essentially her symptoms have worsened over the past 1-2 months. Complains of ongoing alternating constipation and diarrhea, about 50% each. Some bright red blood per rectum especially with hard stool. No melena. She notes postprandial epigastric pain, crampy in nature. Sometimes when it happens she actually has some regurgitation and heartburn. We'll feel the urge to have a BM, sometime she does other time she can't go. Some modest relief and abdominal cramping after BM but it does not go away. Tried various over-the-counter acid reducers. Has been back on pantoprazole 40 mg daily for about 6 months. Really not a big benefit. Couple weeks ago started on Bentyl 20 mg 3 times a day with some modest relief. She takes ibuprofen 400 mg about 3-4 times weekly. Denies aspirin powders. Currently has Nexplanon implant, states actively having menses.  Crohn's on dad's side of family.   Recent labs including sedimentation rate, TTG, LFTs, lipase, CBC unremarkable.  Current Outpatient Prescriptions  Medication Sig Dispense Refill  . acetaminophen (TYLENOL) 325 MG tablet Take 650 mg by mouth every 6 (six) hours as needed for mild  pain, moderate pain or headache.     . dicyclomine (BENTYL) 20 MG tablet Take 1 tablet (20 mg total) by mouth 3 (three) times daily as needed. 30 tablet 1  . escitalopram (LEXAPRO) 10 MG tablet Take 1 tablet (10 mg total) by mouth daily. 30 tablet 3  . etonogestrel (NEXPLANON) 68 MG IMPL implant 1 each by Subdermal route once.    . Iron-FA-B Cmp-C-Biot-Probiotic (FUSION PLUS) CAPS Take 1 capsule by mouth daily.    . pantoprazole (PROTONIX) 40 MG tablet Take 1 tablet (40 mg total) by mouth daily. 30 tablet 3   No current facility-administered medications for this visit.     Allergies as of 07/12/2017 - Review Complete 07/12/2017  Allergen Reaction Noted  . Adhesive [tape] Other (See Comments) 06/14/2015  . Lorabid [loracarbef] Hives, Swelling, and Other (See Comments) 03/28/2013  . Latex Itching and Rash 07/18/2013  . Orange fruit [citrus] Rash 07/18/2013   Past Medical History:  Diagnosis Date  . Asthma   . Endometritis 01/11/2015  . GERD (gastroesophageal reflux disease)   . History of ovarian cyst 01/28/2015  . IBS (irritable bowel syndrome)   . Nausea and vomiting 01/11/2015  . Ovarian cyst 01/14/2015  . Pelvic pain in female 01/11/2015  . Pregnancy induced hypertension   . Pregnant 02/05/2016   Past Surgical History:  Procedure Laterality Date  . CESAREAN SECTION N/A 09/29/2014   Procedure: CESAREAN SECTION;  Surgeon: John Ferguson V, MD;  Location: [facility];  Service: Obstetrics;  Laterality: N/A;  . CESAREAN SECTION N/A 09/25/2016     Procedure: CESAREAN SECTION;  Surgeon: John V Ferguson, MD;  Location: [facility];  Service: Obstetrics;  Laterality: N/A;  . ESOPHAGOGASTRODUODENOSCOPY (EGD) WITH ESOPHAGEAL DILATION N/A 07/19/2013   Dr. Rourk:normal appearing esophagus s/p dilation, normal D1 and D2, unremarkable path   . none    . WISDOM TOOTH EXTRACTION     Family History  Problem Relation Age of Onset  . Multiple sclerosis Mother   . Heart disease Maternal Grandfather   .  Heart disease Paternal Uncle   . Diabetes Paternal Uncle   . Hypertension Paternal Uncle   . Cancer Other        father's aunt had breast cancer  . Colon cancer Other        maternal great uncle, age greater than 60  . Crohn's disease Other        couple of family members on father's side of family   Social History  Substance Use Topics  . Smoking status: Never Smoker  . Smokeless tobacco: Never Used  . Alcohol use No    ROS:  General: Negative for anorexia, weight loss, fever, chills, fatigue, weakness. ENT: Negative for hoarseness, difficulty swallowing , nasal congestion. CV: Negative for chest pain, angina, palpitations, dyspnea on exertion, peripheral edema.  Respiratory: Negative for dyspnea at rest, dyspnea on exertion, cough, sputum, wheezing.  GI: See history of present illness. GU:  Negative for dysuria, hematuria, urinary incontinence, urinary frequency, nocturnal urination.  Endo: Negative for unusual weight change.    Physical Examination:   BP (!) 141/88   Pulse 94   Temp (!) 97.5 F (36.4 C) (Oral)   Ht 5' 3" (1.6 m)   Wt 199 lb (90.3 kg)   LMP 07/12/2017   BMI 35.25 kg/m   General: Well-nourished, well-developed in no acute distress.  Eyes: No icterus. Mouth: Oropharyngeal mucosa moist and pink , no lesions erythema or exudate. Lungs: Clear to auscultation bilaterally.  Heart: Regular rate and rhythm, no murmurs rubs or gallops.  Abdomen: Bowel sounds are normal, nondistended, no hepatosplenomegaly or masses, no abdominal bruits or hernia , no rebound or guarding.  Moderate epigastric tenderness to deep palpation Extremities: No lower extremity edema. No clubbing or deformities. Neuro: Alert and oriented x 4   Skin: Warm and dry, no jaundice.   Psych: Alert and cooperative, normal mood and affect.  Labs:  Lab Results  Component Value Date   ESRSEDRATE 7 06/30/2017   Lab Results  Component Value Date   WBC 7.9 06/30/2017   HGB 12.5 06/30/2017     HCT 39.6 06/30/2017   MCV 74 (L) 06/30/2017   PLT 343 06/30/2017    Lab Results  Component Value Date   ALT 10 06/30/2017   AST 16 06/30/2017   ALKPHOS 107 06/30/2017   BILITOT 0.6 06/30/2017   Lab Results  Component Value Date   LIPASE 32 06/30/2017    TTG <2 on 06/30/17   Imaging Studies: No results found.   Impression/plan:  21-year-old female with acute on chronic abdominal pain including alternating constipation/diarrhea, crampy epigastric pain worse with meals and sometimes relieved with BM, GERD, intermittent rectal bleeding. Chronic intermittent NSAIDs for knee injury. Partial relief with use of PPI and Bentyl. Suspect gastritis, IBS, benign anorectal bleeding however cannot exclude peptic ulcer disease, IBD.  Plan for colonoscopy with possible upper endoscopy in the near future with Dr. Rourk, deep sedation in the OR.  I have discussed the risks, alternatives, benefits with regards to but not limited   to the risk of reaction to medication, bleeding, infection, perforation and the patient is agreeable to proceed. Written consent to be obtained.   Switch pantoprazole to Dexilant 60mg daily. Continue Bentyl but became mindful that I can worsen constipation. Hold if necessary.  

## 2017-08-05 ENCOUNTER — Encounter (HOSPITAL_COMMUNITY): Payer: Self-pay | Admitting: Internal Medicine

## 2017-09-24 ENCOUNTER — Ambulatory Visit: Payer: 59 | Admitting: Gastroenterology

## 2017-10-11 ENCOUNTER — Encounter: Payer: Self-pay | Admitting: Family Medicine

## 2017-10-11 ENCOUNTER — Ambulatory Visit (INDEPENDENT_AMBULATORY_CARE_PROVIDER_SITE_OTHER): Payer: 59 | Admitting: Family Medicine

## 2017-10-11 VITALS — BP 124/70 | Temp 99.1°F | Wt 202.1 lb

## 2017-10-11 DIAGNOSIS — J019 Acute sinusitis, unspecified: Secondary | ICD-10-CM

## 2017-10-11 MED ORDER — AMOXICILLIN-POT CLAVULANATE 875-125 MG PO TABS: 1.0000 | ORAL_TABLET | Freq: Two times a day (BID) | ORAL | 0 refills | Status: DC

## 2017-10-11 NOTE — Progress Notes (Signed)
   Subjective:    Patient ID: Melissa Livingston, female    DOB: October 19, 1996, 21 y.o.   MRN: [id number]  Cough  This is a new problem. The current episode started in the past 7 days. Associated symptoms include a fever, rhinorrhea and a sore throat. Treatments tried: Tylenol, Mucinex   Cough congestion.  Cough minimally productive.  Headache off and on.  Frontal minimal radiation worse with coughing and sneezing aching baseline sharp with coughing States no other concerns this visit.   Review of Systems  Constitutional: Positive for fever.  HENT: Positive for rhinorrhea and sore throat.   Respiratory: Positive for cough.        Objective:   Physical Exam Alert, mild malaise. Hydration good Vitals stable. frontal/ maxillary tenderness evident positive nasal congestion. pharynx normal neck supple  lungs clear/no crackles or wheezes. heart regular in rhythm        Assessment & Plan:  Impression rhinosinusitis likely post viral, discussed with patient. plan antibiotics prescribed. Questions answered. Symptomatic care discussed. warning signs discussed. WSL

## 2017-11-01 ENCOUNTER — Encounter: Payer: Self-pay | Admitting: Nurse Practitioner

## 2017-11-01 ENCOUNTER — Ambulatory Visit (INDEPENDENT_AMBULATORY_CARE_PROVIDER_SITE_OTHER): Payer: 59 | Admitting: Nurse Practitioner

## 2017-11-01 VITALS — BP 110/78 | Ht 63.0 in | Wt 202.0 lb

## 2017-11-01 DIAGNOSIS — M25562 Pain in left knee: Secondary | ICD-10-CM | POA: Diagnosis not present

## 2017-11-01 DIAGNOSIS — G8929 Other chronic pain: Secondary | ICD-10-CM | POA: Diagnosis not present

## 2017-11-01 DIAGNOSIS — Z23 Encounter for immunization: Secondary | ICD-10-CM | POA: Diagnosis not present

## 2017-11-01 DIAGNOSIS — S8012XA Contusion of left lower leg, initial encounter: Secondary | ICD-10-CM | POA: Diagnosis not present

## 2017-11-01 MED ORDER — DICLOFENAC SODIUM 75 MG PO TBEC: 75.0000 mg | DELAYED_RELEASE_TABLET | Freq: Two times a day (BID) | ORAL | 1 refills | Status: DC

## 2017-11-01 NOTE — Progress Notes (Signed)
Subjective: Presents for complaints of a bruise and sore area on the back of her mid left calf that began 5 days ago.  Patient states she was at work and hit the back of her leg.  Later that evening had bruising and tenderness in the leg going down to almost the ankle.  Area is much improved.  Still has a small knot which is slightly tender.  No swelling in the lower leg.  Non-smoker.  No recent long trips.  On Nexplanon for birth control.  At end of visit mentions that she continues to have left knee pain which is chronic.  Dr. Luna Glasgow has drawn fluid off the knee in the past.  Objective:   BP 110/78   Ht 5\' 3"  (1.6 m)   Wt 202 lb (91.6 kg)   BMI 35.78 kg/m  NAD.  Alert, oriented.  Lungs clear.  Heart regular rate and rhythm.  No tachypnea.  Normal color.  Left calf circumference 44 cm, right calf circumference 44.25 cm.  Trace edema noted in both ankles equal both sides.  Strong DP and PP pulses noted left foot, toes warm.  A small nodule noted near the left posterior mid calf area tender to palpation.  No erythema warmth or edema.  No discoloration noted in the skin.  Tenderness along the Achilles tendon with palpation. See MRI of the left knee from 11/16/2014.  Assessment:  Hematoma of left lower extremity, initial encounter  Chronic pain of left knee  Need for influenza vaccination - Plan: Flu Vaccine QUAD 6+ mos PF IM (Fluarix Quad PF)     Plan:   Meds ordered this encounter  Medications  . diclofenac (VOLTAREN) 75 MG EC tablet    Sig: Take 1 tablet (75 mg total) by mouth 2 (two) times daily.    Dispense:  60 tablet    Refill:  1    Order Specific Question:   Supervising Provider    Answer:   Mikey Kirschner [[id number]]   Warm compresses to the affected area.  Warning signs reviewed including DVT.  Expect gradual continued resolution of tenderness and nodule.  Will refer to orthopedic specialist for evaluation of left knee pain.  Call back if any further problems.

## 2017-11-01 NOTE — Patient Instructions (Signed)
Warm compresses to calf area Take medicine as directed as needed Hematoma A hematoma is a collection of blood. The collection of blood can turn into a hard, painful lump under the skin. Your skin may turn blue or yellow if the hematoma is close to the surface of the skin. Most hematomas get better in a few days to weeks. Some hematomas are serious and need medical care. Hematomas can be very small or very big. Follow these instructions at home:  Apply ice to the injured area: ? Put ice in a plastic bag. ? Place a towel between your skin and the bag. ? Leave the ice on for 20 minutes, 2-3 times a day for the first 1 to 2 days.  After the first 2 days, switch to using warm packs on the injured area.  Raise (elevate) the injured area to lessen pain and puffiness (swelling). You may also wrap the area with an elastic bandage. Make sure the bandage is not wrapped too tight.  If you have a painful hematoma on your leg or foot, you may use crutches for a couple days.  Only take medicines as told by your doctor. Get help right away if:  Your pain gets worse.  Your pain is not controlled with medicine.  You have a fever.  Your puffiness gets worse.  Your skin turns more blue or yellow.  Your skin over the hematoma breaks or starts bleeding.  Your hematoma is in your chest or belly (abdomen) and you are short of breath, feel weak, or have a change in consciousness.  Your hematoma is on your scalp and you have a headache that gets worse or a change in alertness or consciousness. This information is not intended to replace advice given to you by your health care provider. Make sure you discuss any questions you have with your health care provider. Document Released: 12/31/2004 Document Revised: 04/30/2016 Document Reviewed: 05/03/2013 Elsevier Interactive Patient Education  2017 [employer].

## 2017-11-16 ENCOUNTER — Ambulatory Visit: Payer: 59 | Admitting: Gastroenterology

## 2017-12-08 ENCOUNTER — Encounter: Payer: Self-pay | Admitting: Orthopaedic Surgery

## 2017-12-08 ENCOUNTER — Ambulatory Visit: Payer: 59 | Admitting: Orthopaedic Surgery

## 2017-12-13 ENCOUNTER — Encounter: Payer: Self-pay | Admitting: Gastroenterology

## 2017-12-13 ENCOUNTER — Ambulatory Visit (INDEPENDENT_AMBULATORY_CARE_PROVIDER_SITE_OTHER): Payer: 59 | Admitting: Adult Health

## 2017-12-13 ENCOUNTER — Ambulatory Visit (INDEPENDENT_AMBULATORY_CARE_PROVIDER_SITE_OTHER): Payer: 59 | Admitting: Gastroenterology

## 2017-12-13 ENCOUNTER — Encounter: Payer: Self-pay | Admitting: Adult Health

## 2017-12-13 ENCOUNTER — Ambulatory Visit: Payer: Medicaid Other | Admitting: Adult Health

## 2017-12-13 VITALS — BP 124/62 | HR 94 | Ht 63.0 in | Wt 202.5 lb

## 2017-12-13 VITALS — BP 136/77 | HR 80 | Temp 97.9°F | Ht 63.0 in | Wt 202.4 lb

## 2017-12-13 DIAGNOSIS — N760 Acute vaginitis: Secondary | ICD-10-CM | POA: Diagnosis not present

## 2017-12-13 DIAGNOSIS — B9689 Other specified bacterial agents as the cause of diseases classified elsewhere: Secondary | ICD-10-CM | POA: Diagnosis not present

## 2017-12-13 DIAGNOSIS — K581 Irritable bowel syndrome with constipation: Secondary | ICD-10-CM

## 2017-12-13 DIAGNOSIS — K625 Hemorrhage of anus and rectum: Secondary | ICD-10-CM

## 2017-12-13 DIAGNOSIS — N898 Other specified noninflammatory disorders of vagina: Secondary | ICD-10-CM | POA: Diagnosis not present

## 2017-12-13 DIAGNOSIS — G8929 Other chronic pain: Secondary | ICD-10-CM | POA: Diagnosis not present

## 2017-12-13 DIAGNOSIS — Z113 Encounter for screening for infections with a predominantly sexual mode of transmission: Secondary | ICD-10-CM

## 2017-12-13 DIAGNOSIS — R1084 Generalized abdominal pain: Secondary | ICD-10-CM | POA: Diagnosis not present

## 2017-12-13 LAB — POCT WET PREP (WET MOUNT)
CLUE CELLS WET PREP WHIFF POC: POSITIVE
WBC WET PREP: POSITIVE

## 2017-12-13 MED ORDER — METRONIDAZOLE 500 MG PO TABS: 500.0000 mg | ORAL_TABLET | Freq: Two times a day (BID) | ORAL | 0 refills | Status: DC

## 2017-12-13 NOTE — Patient Instructions (Addendum)
For constipation: I would like for you to stop the Bentyl. This can worsen constipation. Instead, let's try Linzess 290 micrograms each morning, 30 minutes before breakfast. You may have some loose stool the first few days, but it should get better. If not, we will need to adjust the dose.  For reflux: continue Dexilant once daily. Avoid reflux triggers as noted on this handout attached. Due to abdominal pain, I would like to update the ultrasound of your gallbladder. We may do an additional test if you still have pain despite improvement of constipation.   Make sure to try and limit Ibuprofen and Voltaren. Don't take both at same time. This can cause worsened reflux, gastritis, and ulcers.   I would like for you to come back in 6 weeks. Please let us know how you are doing, as we can adjust the medications in the interim if needed.  Food Choices for Gastroesophageal Reflux Disease, Adult When you have gastroesophageal reflux disease (GERD), the foods you eat and your eating habits are very important. Choosing the right foods can help ease the discomfort of GERD. Consider working with a diet and nutrition specialist (dietitian) to help you make healthy food choices. What general guidelines should I follow? Eating plan  Choose healthy foods low in fat, such as fruits, vegetables, whole grains, low-fat dairy products, and lean meat, fish, and poultry.  Eat frequent, small meals instead of three large meals each day. Eat your meals slowly, in a relaxed setting. Avoid bending over or lying down until 2-3 hours after eating.  Limit high-fat foods such as fatty meats or fried foods.  Limit your intake of oils, butter, and shortening to less than 8 teaspoons each day.  Avoid the following: ? Foods that cause symptoms. These may be different for different people. Keep a food diary to keep track of foods that cause symptoms. ? Alcohol. ? Drinking large amounts of liquid with meals. ? Eating meals  during the 2-3 hours before bed.  Cook foods using methods other than frying. This may include baking, grilling, or broiling. Lifestyle   Maintain a healthy weight. Ask your health care provider what weight is healthy for you. If you need to lose weight, work with your health care provider to do so safely.  Exercise for at least 30 minutes on 5 or more days each week, or as told by your health care provider.  Avoid wearing clothes that fit tightly around your waist and chest.  Do not use any products that contain nicotine or tobacco, such as cigarettes and e-cigarettes. If you need help quitting, ask your health care provider.  Sleep with the head of your bed raised. Use a wedge under the mattress or blocks under the bed frame to raise the head of the bed. What foods are not recommended? The items listed may not be a complete list. Talk with your dietitian about what dietary choices are best for you. Grains Pastries or quick breads with added fat. [place] toast. Vegetables Deep fried vegetables. [place] fries. Any vegetables prepared with added fat. Any vegetables that cause symptoms. For some people this may include tomatoes and tomato products, chili peppers, onions and garlic, and horseradish. Fruits Any fruits prepared with added fat. Any fruits that cause symptoms. For some people this may include citrus fruits, such as oranges, grapefruit, pineapple, and lemons. Meats and other protein foods High-fat meats, such as fatty beef or pork, hot dogs, ribs, ham, sausage, salami and bacon. Fried meat or  protein, including fried fish and fried chicken. Nuts and nut butters. Dairy Whole milk and chocolate milk. Sour cream. Cream. Ice cream. Cream cheese. Milk shakes. Beverages Coffee and tea, with or without caffeine. Carbonated beverages. Sodas. Energy drinks. Fruit juice made with acidic fruits (such as orange or grapefruit). Tomato juice. Alcoholic drinks. Fats and oils Butter. Margarine.  Shortening. Ghee. Sweets and desserts Chocolate and cocoa. Donuts. Seasoning and other foods Pepper. Peppermint and spearmint. Any condiments, herbs, or seasonings that cause symptoms. For some people, this may include curry, hot sauce, or vinegar-based salad dressings. Summary  When you have gastroesophageal reflux disease (GERD), food and lifestyle choices are very important to help ease the discomfort of GERD.  Eat frequent, small meals instead of three large meals each day. Eat your meals slowly, in a relaxed setting. Avoid bending over or lying down until 2-3 hours after eating.  Limit high-fat foods such as fatty meat or fried foods. This information is not intended to replace advice given to you by your health care provider. Make sure you discuss any questions you have with your health care provider. Document Released: 11/23/2005 Document Revised: 11/24/2016 Document Reviewed: 11/24/2016 Elsevier Interactive Patient Education  [employer].

## 2017-12-13 NOTE — Progress Notes (Signed)
Subjective:     Patient ID: Melissa Livingston, female   DOB: 24-Dec-1995, 24 y.o.   MRN: [id number]  HPI Sally-Ann is a 22 year old black female in complaining of vaginal discharge with odor and vaginal dryness at times.Has been stressed, looking for a job as CMA.  Review of Systems Vaginal discharge with odor Vaginal dryness  + stress  Reviewed past medical,surgical, social and family history. Reviewed medications and allergies.     Objective:   Physical Exam BP 124/62 (BP Location: Left Arm, Patient Position: Sitting, Cuff Size: Large)   Pulse 94   Ht 5\' 3"  (1.6 m)   Wt 202 lb 8 oz (91.9 kg)   Breastfeeding? No   BMI 35.87 kg/m  Skin warm and dry.Pelvic: external genitalia is normal in appearance no lesions, vagina: white discharge with odor,urethra has no lesions or masses noted, cervix:smooth and bulbous, uterus: normal size, shape and contour, non tender, no masses felt, adnexa: no masses or tenderness noted. Bladder is non tender and no masses felt. Wet prep: + for clue cells and +WBCs. GC/CHL obtained.     Assessment:     1. BV (bacterial vaginosis)   2. Vaginal discharge   3. Vaginal dryness   4. Screening examination for STD (sexually transmitted disease)       Plan:     Meds ordered this encounter  Medications  . metroNIDAZOLE (FLAGYL) 500 MG tablet    Sig: Take 1 tablet (500 mg total) by mouth 2 (two) times daily.    Dispense:  14 tablet    Refill:  0    Order Specific Question:   Supervising Provider    Answer:   Florian Buff [[id number]]  GC/CHL sent Check HIV and RPR Return in 4 weeks for HIV and RPR

## 2017-12-13 NOTE — Assessment & Plan Note (Signed)
In setting of constipation and known internal hemorrhoids. Treat constipation. May use preparation H OTC if needed. Call if worsens or persists despite bowel regimen.

## 2017-12-13 NOTE — Progress Notes (Signed)
CC'ED TO PCP 

## 2017-12-13 NOTE — Assessment & Plan Note (Signed)
22 year old female with chronic abdominal pain, GERD, recent EGD normal. Continued dyspepsia, with pain worsened after eating. However, she also notes improvement with BM. Likely multifactorial in setting of IBS-C, GERD, unable to completely rule out biliary component as she does note eating precipitates symptoms. As we haven't had a recent RUQ [place], I am ordering this. LFTs normal several months ago, and she has not had any changes in her chronic symptoms, so I will hold on updating labs for now. Will consider HIDA to wrap up GI evaluation for upper GI symptoms. Continue Dexilant for now. GERD diet discussed and provided. 6 week return for close follow-up.

## 2017-12-13 NOTE — Assessment & Plan Note (Signed)
With constipation. Stop Bentyl, as this is likely exacerbating it. Trial of Linzess 290 mcg once daily. Samples provided. Return in 6 weeks.

## 2017-12-13 NOTE — Progress Notes (Signed)
Referring Provider: Kathyrn Drown, MD Primary Care Physician:  Kathyrn Drown, MD Primary GI: Dr. Gala Romney   Chief Complaint  Patient presents with  . Rectal Bleeding    w/ BM only  . dyspepsia    f/u. A lot of belching    HPI:   Melissa Livingston is a 22 y.o. female presenting today with a history of chronic abdominal pain, rectal bleeding, GERD. Colonoscopy and EGD recently updated. Internal hemorrhoids on colonoscopy, otherwise normal. EGD essentially normal. Gallbladder present. Last US abdomen in 2016. CT in 2016 as well. No prior HIDA. LFTs normal several months ago.   Still with GERD symptoms. Takes Dexilant daily. Drinks ginger ale. On Voltaren and Ibuprofen. Occasional fried foods. Has stomach cramping in middle of stomach. Associated nausea. Pain is worsened after eating, makes her feel like she has to have a BM but can't. Pain improved after BM. Lots of belching, burping, even happens with water.   Dealing with constipation. May go up to 2 weeks without having a BM. When she does have it, will be runny. Taking an OTC stool softener. Takes Bentyl BID. Helps with cramping. Occasional rectal bleeding with BMs.     Past Medical History:  Diagnosis Date  . Anemia   . Anxiety   . Asthma    as child  . Endometritis 01/11/2015  . GERD (gastroesophageal reflux disease)   . History of ovarian cyst 01/28/2015  . IBS (irritable bowel syndrome)   . Nausea and vomiting 01/11/2015  . Ovarian cyst 01/14/2015  . Pelvic pain in female 01/11/2015  . Pregnancy induced hypertension    with pregnancy; no meds now.  . Pregnant 02/05/2016    Past Surgical History:  Procedure Laterality Date  . CESAREAN SECTION N/A 09/29/2014   Procedure: CESAREAN SECTION;  Surgeon: Jonnie Kind, MD;  Location: [facility];  Service: Obstetrics;  Laterality: N/A;  . CESAREAN SECTION N/A 09/25/2016   Procedure: CESAREAN SECTION;  Surgeon: Jonnie Kind, MD;  Location: [facility];  Service: Obstetrics;   Laterality: N/A;  . COLONOSCOPY WITH PROPOFOL N/A 08/02/2017   Dr. Gala Romney: moderate internal hemorhoids, distal 5 cm of TI also appeared normal  . ESOPHAGOGASTRODUODENOSCOPY (EGD) WITH ESOPHAGEAL DILATION N/A 07/19/2013   Dr. Rourk:normal appearing esophagus s/p dilation, normal D1 and D2, unremarkable path   . ESOPHAGOGASTRODUODENOSCOPY (EGD) WITH PROPOFOL N/A 08/02/2017   Normal esophagus, small hiatal hernia, normal duodenum  . none    . WISDOM TOOTH EXTRACTION      Current Outpatient Medications  Medication Sig Dispense Refill  . acetaminophen (TYLENOL) 325 MG tablet Take 650 mg by mouth every 6 (six) hours as needed for mild pain, moderate pain or headache.     . dexlansoprazole (DEXILANT) 60 MG capsule Take 1 capsule (60 mg total) by mouth daily before breakfast. 30 capsule 3  . diclofenac (VOLTAREN) 75 MG EC tablet Take 1 tablet (75 mg total) by mouth 2 (two) times daily. 60 tablet 1  . dicyclomine (BENTYL) 20 MG tablet Take 1 tablet (20 mg total) by mouth 3 (three) times daily as needed. (Patient taking differently: Take 20 mg by mouth 3 (three) times daily as needed for spasms. ) 30 tablet 1  . etonogestrel (NEXPLANON) 68 MG IMPL implant 1 each by Subdermal route once.    Marland Kitchen ibuprofen (ADVIL,MOTRIN) 200 MG tablet Take 400 mg by mouth every 6 (six) hours as needed for mild pain.    . Iron-FA-B Cmp-C-Biot-Probiotic (  FUSION PLUS) CAPS Take 1 capsule by mouth daily.     No current facility-administered medications for this visit.     Allergies as of 12/13/2017 - Review Complete 12/13/2017  Allergen Reaction Noted  . Adhesive [tape] Other (See Comments) 06/14/2015  . Lorabid [loracarbef] Hives, Swelling, and Other (See Comments) 03/28/2013  . Latex Itching and Rash 07/18/2013  . Orange fruit [citrus] Rash 07/18/2013    Family History  Problem Relation Age of Onset  . Multiple sclerosis Mother   . Heart disease Maternal Grandfather   . Heart disease Paternal Uncle   . Diabetes  Paternal Uncle   . Hypertension Paternal Uncle   . Cancer Other        father's aunt had breast cancer  . Colon cancer Other        maternal great uncle, age greater than 104  . Crohn's disease Other        couple of family members on father's side of family    Social History   Socioeconomic History  . Marital status: Single    Spouse name: None  . Number of children: 2  . Years of education: None  . Highest education level: None  Social Needs  . Financial resource strain: None  . Food insecurity - worry: None  . Food insecurity - inability: None  . Transportation needs - medical: None  . Transportation needs - non-medical: None  Occupational History  . Occupation: out due to knee  Tobacco Use  . Smoking status: Never Smoker  . Smokeless tobacco: Never Used  Substance and Sexual Activity  . Alcohol use: No    Alcohol/week: 0.0 oz  . Drug use: No  . Sexual activity: Yes    Birth control/protection: Implant  Other Topics Concern  . None  Social History Narrative  . None    Review of Systems: Gen: Denies fever, chills, anorexia. Denies fatigue, weakness, weight loss.  CV: Denies chest pain, palpitations, syncope, peripheral edema, and claudication. Resp: Denies dyspnea at rest, cough, wheezing, coughing up blood, and pleurisy. GI: see HPI  Derm: Denies rash, itching, dry skin Psych: Denies depression, anxiety, memory loss, confusion. No homicidal or suicidal ideation.  Heme: see HPI   Physical Exam: BP 136/77   Pulse 80   Temp 97.9 F (36.6 C) (Oral)   Ht 5\' 3"  (1.6 m)   Wt 202 lb 6.4 oz (91.8 kg)   BMI 35.85 kg/m  General:   Alert and oriented. No distress noted. Pleasant and cooperative.  Head:  Normocephalic and atraumatic. Eyes:  Conjuctiva clear without scleral icterus. Mouth:  Oral mucosa pink and moist.  Abdomen:  +BS, soft, TTP periumbilically, RUQ, and non-distended. No rebound or guarding. No HSM or masses noted. Msk:  Symmetrical without gross  deformities. Normal posture. Extremities:  Without edema. Neurologic:  Alert and  oriented x4 Psych:  Alert and cooperative. Normal mood and affect.

## 2017-12-14 LAB — HIV ANTIBODY (ROUTINE TESTING W REFLEX): HIV Screen 4th Generation wRfx: NONREACTIVE

## 2017-12-14 LAB — RPR: RPR: NONREACTIVE

## 2017-12-15 LAB — GC/CHLAMYDIA PROBE AMP
CHLAMYDIA, DNA PROBE: NEGATIVE
NEISSERIA GONORRHOEAE BY PCR: NEGATIVE

## 2017-12-16 ENCOUNTER — Ambulatory Visit (HOSPITAL_COMMUNITY)
Admission: RE | Admit: 2017-12-16 | Discharge: 2017-12-16 | Disposition: A | Discharge status: Home / Self Care | Payer: 59 | Source: Ambulatory Visit | Attending: Gastroenterology | Admitting: Gastroenterology

## 2017-12-16 ENCOUNTER — Ambulatory Visit: Payer: 59 | Admitting: Orthopaedic Surgery

## 2017-12-16 ENCOUNTER — Encounter: Payer: Self-pay | Admitting: Orthopaedic Surgery

## 2017-12-16 ENCOUNTER — Ambulatory Visit (INDEPENDENT_AMBULATORY_CARE_PROVIDER_SITE_OTHER): Payer: 59

## 2017-12-16 VITALS — BP 141/90 | HR 98 | Ht 64.0 in | Wt 201.0 lb

## 2017-12-16 DIAGNOSIS — G8929 Other chronic pain: Secondary | ICD-10-CM

## 2017-12-16 DIAGNOSIS — R109 Unspecified abdominal pain: Secondary | ICD-10-CM | POA: Diagnosis not present

## 2017-12-16 DIAGNOSIS — M25562 Pain in left knee: Secondary | ICD-10-CM

## 2017-12-16 DIAGNOSIS — R110 Nausea: Secondary | ICD-10-CM | POA: Diagnosis not present

## 2017-12-16 DIAGNOSIS — R1084 Generalized abdominal pain: Secondary | ICD-10-CM | POA: Diagnosis not present

## 2017-12-16 MED ORDER — NAPROXEN 500 MG PO TABS: 500.0000 mg | ORAL_TABLET | Freq: Two times a day (BID) | ORAL | 5 refills | Status: DC

## 2017-12-16 NOTE — Progress Notes (Signed)
Subjective:    Patient ID: Melissa Livingston, female    DOB: 1996/05/08, 22 y.o.   MRN: [id number]  HPI She has had pain in the left knee on and off for several years. I saw her years ago with left knee pain and did an aspiration.  She has done well until recently and having more pain, more swelling, tendency to give way but no locking and no redness.  She has no trauma.  She has taken Tylenol and used ice and heat. She has seen her family doctor.  She has no trauma.   Review of Systems  HENT: Negative for congestion.   Respiratory: Negative for cough and shortness of breath.   Cardiovascular: Negative for chest pain and leg swelling.  Musculoskeletal: Positive for arthralgias, gait problem and joint swelling.  All other systems reviewed and are negative.  Past Medical History:  Diagnosis Date  . Anemia   . Anxiety   . Asthma    as child  . Endometritis 01/11/2015  . GERD (gastroesophageal reflux disease)   . History of ovarian cyst 01/28/2015  . IBS (irritable bowel syndrome)   . Nausea and vomiting 01/11/2015  . Ovarian cyst 01/14/2015  . Pelvic pain in female 01/11/2015  . Pregnancy induced hypertension    with pregnancy; no meds now.  . Pregnant 02/05/2016    Past Surgical History:  Procedure Laterality Date  . CESAREAN SECTION N/A 09/29/2014   Procedure: CESAREAN SECTION;  Surgeon: Jonnie Kind, MD;  Location: [facility];  Service: Obstetrics;  Laterality: N/A;  . CESAREAN SECTION N/A 09/25/2016   Procedure: CESAREAN SECTION;  Surgeon: Jonnie Kind, MD;  Location: [facility];  Service: Obstetrics;  Laterality: N/A;  . COLONOSCOPY WITH PROPOFOL N/A 08/02/2017   Dr. Gala Romney: moderate internal hemorhoids, distal 5 cm of TI also appeared normal  . ESOPHAGOGASTRODUODENOSCOPY (EGD) WITH ESOPHAGEAL DILATION N/A 07/19/2013   Dr. Rourk:normal appearing esophagus s/p dilation, normal D1 and D2, unremarkable path   . ESOPHAGOGASTRODUODENOSCOPY (EGD) WITH PROPOFOL N/A 08/02/2017   Normal  esophagus, small hiatal hernia, normal duodenum  . none    . WISDOM TOOTH EXTRACTION      Current Outpatient Medications on File Prior to Visit  Medication Sig Dispense Refill  . acetaminophen (TYLENOL) 325 MG tablet Take 650 mg by mouth every 6 (six) hours as needed for mild pain, moderate pain or headache.     . dexlansoprazole (DEXILANT) 60 MG capsule Take 1 capsule (60 mg total) by mouth daily before breakfast. 30 capsule 3  . diclofenac (VOLTAREN) 75 MG EC tablet Take 1 tablet (75 mg total) by mouth 2 (two) times daily. 60 tablet 1  . dicyclomine (BENTYL) 20 MG tablet Take 1 tablet (20 mg total) by mouth 3 (three) times daily as needed. (Patient taking differently: Take 20 mg by mouth 3 (three) times daily as needed for spasms. ) 30 tablet 1  . etonogestrel (NEXPLANON) 68 MG IMPL implant 1 each by Subdermal route once.    . Iron-FA-B Cmp-C-Biot-Probiotic (FUSION PLUS) CAPS Take 1 capsule by mouth daily.    . metroNIDAZOLE (FLAGYL) 500 MG tablet Take 1 tablet (500 mg total) by mouth 2 (two) times daily. 14 tablet 0   No current facility-administered medications on file prior to visit.     Social History   Socioeconomic History  . Marital status: Single    Spouse name: Not on file  . Number of children: 2  . Years of education: Not  on file  . Highest education level: Not on file  Social Needs  . Financial resource strain: Not on file  . Food insecurity - worry: Not on file  . Food insecurity - inability: Not on file  . Transportation needs - medical: Not on file  . Transportation needs - non-medical: Not on file  Occupational History  . Occupation: out due to knee  Tobacco Use  . Smoking status: Never Smoker  . Smokeless tobacco: Never Used  Substance and Sexual Activity  . Alcohol use: No    Alcohol/week: 0.0 oz  . Drug use: No  . Sexual activity: Yes    Birth control/protection: Implant  Other Topics Concern  . Not on file  Social History Narrative  . Not on file     Family History  Problem Relation Age of Onset  . Multiple sclerosis Mother   . Heart disease Maternal Grandfather   . Heart disease Paternal Uncle   . Diabetes Paternal Uncle   . Hypertension Paternal Uncle   . Cancer Other        father's aunt had breast cancer  . Colon cancer Other        maternal great uncle, age greater than 43  . Crohn's disease Other        couple of family members on father's side of family    BP (!) 141/90   Pulse 98   Ht 5\' 4"  (1.626 m)   Wt 201 lb (91.2 kg)   BMI 34.50 kg/m      Objective:   Physical Exam  Constitutional: She is oriented to person, place, and time. She appears well-developed and well-nourished.  HENT:  Head: Normocephalic and atraumatic.  Eyes: Conjunctivae and EOM are normal. Pupils are equal, round, and reactive to light.  Neck: Normal range of motion. Neck supple.  Cardiovascular: Normal rate, regular rhythm and intact distal pulses.  Pulmonary/Chest: Effort normal.  Abdominal: Soft.  Musculoskeletal: She exhibits tenderness (Left knee tender, ROM 0 to 110, slight crepitus, more medial pain, limp left, effusion 1+, NV intact. Right knee negative.).  Neurological: She is alert and oriented to person, place, and time. She displays normal reflexes. No cranial nerve deficit. She exhibits normal muscle tone. Coordination normal.  Skin: Skin is warm and dry.  Psychiatric: She has a normal mood and affect. Her behavior is normal. Judgment and thought content normal.  Vitals reviewed.    X-rays were done of the left knee, reported separately.     Assessment & Plan:   Encounter Diagnosis  Name Primary?  . Chronic pain of left knee Yes   PROCEDURE NOTE:  The patient requests injections of the left knee , verbal consent was obtained.  The left knee was prepped appropriately after time out was performed.   Sterile technique was observed and injection of 1 cc of Depo-Medrol 40 mg with several cc's of plain xylocaine.  Anesthesia was provided by ethyl chloride and a 20-gauge needle was used to inject the knee area. The injection was tolerated well.  A band aid dressing was applied.  The patient was advised to apply ice later today and tomorrow to the injection sight as needed.  I will begin Naprosyn 500 po bid pc  Return in three weeks.  She may need MRI of the left knee.  Call if any problem.  Precautions discussed.   Electronically Signed Sanjuana Kava, MD 1/10/20194:08 PM

## 2017-12-22 NOTE — Progress Notes (Signed)
Melissa Livingston: your ultrasound was normal. If you are still having abdominal pain, we can order a HIDA scan to check how well your gallbladder is functioning. Would you like to proceed with this?

## 2018-01-03 ENCOUNTER — Encounter: Payer: Self-pay | Admitting: Adult Health

## 2018-01-03 ENCOUNTER — Other Ambulatory Visit: Payer: Self-pay

## 2018-01-03 ENCOUNTER — Ambulatory Visit (INDEPENDENT_AMBULATORY_CARE_PROVIDER_SITE_OTHER): Payer: 59 | Admitting: Adult Health

## 2018-01-03 VITALS — BP 128/72 | HR 107 | Resp 18 | Ht 64.0 in | Wt 202.0 lb

## 2018-01-03 DIAGNOSIS — Z113 Encounter for screening for infections with a predominantly sexual mode of transmission: Secondary | ICD-10-CM | POA: Diagnosis not present

## 2018-01-03 DIAGNOSIS — Z3009 Encounter for other general counseling and advice on contraception: Secondary | ICD-10-CM

## 2018-01-03 DIAGNOSIS — Z01419 Encounter for gynecological examination (general) (routine) without abnormal findings: Secondary | ICD-10-CM

## 2018-01-03 DIAGNOSIS — Z975 Presence of (intrauterine) contraceptive device: Secondary | ICD-10-CM | POA: Diagnosis not present

## 2018-01-03 DIAGNOSIS — Z01411 Encounter for gynecological examination (general) (routine) with abnormal findings: Secondary | ICD-10-CM | POA: Diagnosis not present

## 2018-01-03 DIAGNOSIS — R1011 Right upper quadrant pain: Secondary | ICD-10-CM | POA: Diagnosis not present

## 2018-01-03 NOTE — Progress Notes (Signed)
Patient ID: Melissa Livingston, female   DOB: 12/28/1995, 21 y.o.   MRN: [id number] History of Present Illness:  Melissa Livingston is a 21 year old black female in for well woman gyn exam, she had normal pap 05/26/16.She has family planning medicaid and [place]. PCP is [facility].   Current Medications, Allergies, Past Medical History, Past Surgical History, Family History and Social History were reviewed in [employer] record.     Review of Systems:  Patient denies any headaches, hearing loss, fatigue, blurred vision, shortness of breath, chest pain,  problems with bowel movements, urination, or intercourse( has pain at times and decreased sex drive). No joint pain or mood swings. +RUQ pain, Had negative [place] and may get HIDDA scan, sees Roseanne Kaufman, NP.   Physical Exam:BP 128/72 (BP Location: Right Arm, Patient Position: Sitting, Cuff Size: Large)   Pulse (!) 107   Resp 18   Ht 5\' 4"  (1.626 m)   Wt 202 lb (91.6 kg)   BMI 34.67 kg/m  General:  Well developed, well nourished, no acute distress Skin:  Warm and dry Neck:  Midline trachea, normal thyroid, good ROM, no lymphadenopathy Lungs; Clear to auscultation bilaterally Breast:  No dominant palpable mass, retraction, or nipple discharge Cardiovascular: Regular rate and rhythm Abdomen:  Soft, no hepatosplenomegaly,+RUQ and epigastric tenderness Pelvic:  External genitalia is normal in appearance, no lesions.  The vagina is normal in appearance. Urethra has no lesions or masses. The cervix is bulbous.GC/CHL obtained.   Uterus is felt to be normal size, shape, and contour.  No adnexal masses or tenderness noted.Bladder is non tender, no masses felt. Extremities/musculoskeletal:  No swelling or varicosities noted, no clubbing or cyanosis,nexplanon in right arm  Psych:  No mood changes, alert and cooperative,seems happy PHQ 2 score 0.  Impression: 1. Encounter for well woman exam with routine gynecological exam   2. Family planning   3.  Screening examination for STD (sexually transmitted disease)   4. Nexplanon in place       Plan: Check HIV and RPR GC/CHL sent Pap and physical in 1 year

## 2018-01-04 ENCOUNTER — Encounter: Payer: Self-pay | Admitting: Gastroenterology

## 2018-01-04 ENCOUNTER — Other Ambulatory Visit: Payer: Self-pay | Admitting: *Deleted

## 2018-01-04 DIAGNOSIS — R109 Unspecified abdominal pain: Secondary | ICD-10-CM

## 2018-01-04 LAB — HIV ANTIBODY (ROUTINE TESTING W REFLEX): HIV SCREEN 4TH GENERATION: NONREACTIVE

## 2018-01-04 LAB — RPR: RPR: NONREACTIVE

## 2018-01-04 NOTE — Telephone Encounter (Signed)
Patient would like to proceed with HIDA scan. Please arrange. Thanks!

## 2018-01-04 NOTE — Telephone Encounter (Signed)
HIDA scan scheduled for 01/10/18 at 8:00am, no pain medications morning of test, NPO after midnight. Test will take about 2-2 1/2 hours  Checked patients insurance for [facility] and no precert is required.

## 2018-01-05 LAB — GC/CHLAMYDIA PROBE AMP
CHLAMYDIA, DNA PROBE: NEGATIVE
Neisseria gonorrhoeae by PCR: NEGATIVE

## 2018-01-06 ENCOUNTER — Encounter: Payer: Self-pay | Admitting: Orthopaedic Surgery

## 2018-01-06 ENCOUNTER — Ambulatory Visit: Payer: 59 | Admitting: Orthopaedic Surgery

## 2018-01-06 VITALS — BP 117/80 | HR 87 | Temp 98.1°F | Ht 65.0 in | Wt 202.0 lb

## 2018-01-06 DIAGNOSIS — G8929 Other chronic pain: Secondary | ICD-10-CM

## 2018-01-06 DIAGNOSIS — M25562 Pain in left knee: Secondary | ICD-10-CM

## 2018-01-06 NOTE — Progress Notes (Signed)
Patient Melissa Livingston:JQBH S Segel, female DOB:[birth year], 22 y.o. ALP:[id number]  Chief Complaint  Patient presents with  . Follow-up    Recheck on left knee.    HPI  [place] Melissa Livingston is a 22 y.o. female who has continued pain of the left knee.  The injection last time did not help.  She has feeling of giving way but it has not.  Nothing has helped.    I am concerned about a torn meniscus.  I feel she may need MRI.  I will begin PT for her.  HPI  Body mass index is 33.61 kg/m.  ROS  Review of Systems  HENT: Negative for congestion.   Respiratory: Negative for cough and shortness of breath.   Cardiovascular: Negative for chest pain and leg swelling.  Musculoskeletal: Positive for arthralgias, gait problem and joint swelling.  All other systems reviewed and are negative.   Past Medical History:  Diagnosis Date  . Anemia   . Anxiety   . Asthma    as child  . Endometritis 01/11/2015  . GERD (gastroesophageal reflux disease)   . History of ovarian cyst 01/28/2015  . IBS (irritable bowel syndrome)   . Nausea and vomiting 01/11/2015  . Ovarian cyst 01/14/2015  . Pelvic pain in female 01/11/2015  . Pregnancy induced hypertension    with pregnancy; no meds now.  . Pregnant 02/05/2016    Past Surgical History:  Procedure Laterality Date  . CESAREAN SECTION N/A 09/29/2014   Procedure: CESAREAN SECTION;  Surgeon: Jonnie Kind, MD;  Location: [facility];  Service: Obstetrics;  Laterality: N/A;  . CESAREAN SECTION N/A 09/25/2016   Procedure: CESAREAN SECTION;  Surgeon: Jonnie Kind, MD;  Location: [facility];  Service: Obstetrics;  Laterality: N/A;  . COLONOSCOPY WITH PROPOFOL N/A 08/02/2017   Dr. Gala Romney: moderate internal hemorhoids, distal 5 cm of TI also appeared normal  . ESOPHAGOGASTRODUODENOSCOPY (EGD) WITH ESOPHAGEAL DILATION N/A 07/19/2013   Dr. Rourk:normal appearing esophagus s/p dilation, normal D1 and D2, unremarkable path   . ESOPHAGOGASTRODUODENOSCOPY (EGD) WITH PROPOFOL N/A  08/02/2017   Normal esophagus, small hiatal hernia, normal duodenum  . none    . WISDOM TOOTH EXTRACTION      Family History  Problem Relation Age of Onset  . Multiple sclerosis Mother   . Heart disease Maternal Grandfather   . Heart disease Paternal Uncle   . Diabetes Paternal Uncle   . Hypertension Paternal Uncle   . Cancer Other        father's aunt had breast cancer  . Colon cancer Other        maternal great uncle, age greater than 50  . Crohn's disease Other        couple of family members on father's side of family    Social History Social History   Tobacco Use  . Smoking status: Never Smoker  . Smokeless tobacco: Never Used  Substance Use Topics  . Alcohol use: No    Alcohol/week: 0.0 oz  . Drug use: No    Allergies  Allergen Reactions  . Adhesive [Tape] Other (See Comments)    Reaction:  Blisters   . Lorabid [Loracarbef] Hives, Swelling and Other (See Comments)    Reaction:  Mouth swelling   . Latex Itching and Rash  . Orange Fruit [Citrus] Rash    Current Outpatient Medications  Medication Sig Dispense Refill  . acetaminophen (TYLENOL) 325 MG tablet Take 650 mg by mouth every 6 (six) hours as needed for  mild pain, moderate pain or headache.     . dexlansoprazole (DEXILANT) 60 MG capsule Take 1 capsule (60 mg total) by mouth daily before breakfast. 30 capsule 3  . diclofenac (VOLTAREN) 75 MG EC tablet Take 1 tablet (75 mg total) by mouth 2 (two) times daily. (Patient not taking: Reported on 01/03/2018) 60 tablet 1  . dicyclomine (BENTYL) 20 MG tablet Take 1 tablet (20 mg total) by mouth 3 (three) times daily as needed. (Patient taking differently: Take 20 mg by mouth 3 (three) times daily as needed for spasms. ) 30 tablet 1  . etonogestrel (NEXPLANON) 68 MG IMPL implant 1 each by Subdermal route once.    . Iron-FA-B Cmp-C-Biot-Probiotic (FUSION PLUS) CAPS Take 1 capsule by mouth daily.    . naproxen (NAPROSYN) 500 MG tablet Take 1 tablet (500 mg total) by  mouth 2 (two) times daily with a meal. 60 tablet 5   No current facility-administered medications for this visit.      Physical Exam  Blood pressure 117/80, pulse 87, temperature 98.1 F (36.7 C), height 5\' 5"  (1.651 m), weight 202 lb (91.6 kg), not currently breastfeeding.  Constitutional: overall normal hygiene, normal nutrition, well developed, normal grooming, normal body habitus. Assistive device:none  Musculoskeletal: gait and station Limp left, muscle tone and strength are normal, no tremors or atrophy is present.  .  Neurological: coordination overall normal.  Deep tendon reflex/nerve stretch intact.  Sensation normal.  Cranial nerves II-XII intact.   Skin:   Normal overall no scars, lesions, ulcers or rashes. No psoriasis.  Psychiatric: Alert and oriented x 3.  Recent memory intact, remote memory unclear.  Normal mood and affect. Well groomed.  Good eye contact.  Cardiovascular: overall no swelling, no varicosities, no edema bilaterally, normal temperatures of the legs and arms, no clubbing, cyanosis and good capillary refill.  Lymphatic: palpation is normal.  Left knee is tender, has 1+ effusion, ROM 0 to 110, medial joint line pain is present, knee is stable, no distal edema.  NV intact.  Limp left.  All other systems reviewed and are negative   The patient has been educated about the nature of the problem(s) and counseled on treatment options.  The patient appeared to understand what I have discussed and is in agreement with it.  No diagnosis found.  PLAN Call if any problems.  Precautions discussed.  Continue current medications.   Return to clinic 3 weeks   Begin PT  Electronically Signed Sanjuana Kava, MD 1/31/20193:22 PM

## 2018-01-10 ENCOUNTER — Encounter (HOSPITAL_COMMUNITY): Admission: RE | Admit: 2018-01-10 | Payer: 59 | Source: Ambulatory Visit

## 2018-01-13 ENCOUNTER — Encounter (HOSPITAL_COMMUNITY)
Admission: RE | Admit: 2018-01-13 | Discharge: 2018-01-13 | Disposition: A | Discharge status: Home / Self Care | Payer: 59 | Source: Ambulatory Visit | Attending: Gastroenterology | Admitting: Gastroenterology

## 2018-01-13 ENCOUNTER — Encounter (HOSPITAL_COMMUNITY): Payer: Self-pay

## 2018-01-13 DIAGNOSIS — R1011 Right upper quadrant pain: Secondary | ICD-10-CM | POA: Diagnosis not present

## 2018-01-13 DIAGNOSIS — R109 Unspecified abdominal pain: Secondary | ICD-10-CM | POA: Diagnosis present

## 2018-01-13 MED ORDER — TECHNETIUM TC 99M MEBROFENIN IV KIT
5.0000 | PACK | Freq: Once | INTRAVENOUS | Status: AC | PRN
  Administered 2018-01-13: 5 via INTRAVENOUS

## 2018-01-18 ENCOUNTER — Encounter (HOSPITAL_COMMUNITY): Payer: Self-pay | Admitting: Physical Therapy

## 2018-01-18 ENCOUNTER — Other Ambulatory Visit: Payer: Self-pay

## 2018-01-18 ENCOUNTER — Ambulatory Visit (HOSPITAL_COMMUNITY): Payer: 59 | Attending: Orthopaedic Surgery | Admitting: Physical Therapy

## 2018-01-18 DIAGNOSIS — M6281 Muscle weakness (generalized): Secondary | ICD-10-CM | POA: Diagnosis present

## 2018-01-18 DIAGNOSIS — M25562 Pain in left knee: Secondary | ICD-10-CM | POA: Diagnosis present

## 2018-01-18 DIAGNOSIS — G8929 Other chronic pain: Secondary | ICD-10-CM

## 2018-01-18 NOTE — Therapy (Signed)
[name], [place], [address] Phone: [phone]   Fax:  [phone]  Physical Therapy Evaluation  Patient Details  Name: Melissa Livingston MRN: [id number] Date of Birth: 1996/08/01 Referring Provider: Sanjuana Kava   Encounter Date: 01/18/2018  PT End of Session - 01/18/18 1437    Visit Number  1    Number of Visits  8    Date for PT Re-Evaluation  02/17/18    Authorization Type  secondary medicaid     Authorization - Visit Number  1    Authorization - Number of Visits  8    PT Start Time  6283    PT Stop Time  1430    PT Time Calculation (min)  45 min    Activity Tolerance  Patient tolerated treatment well    Behavior During Therapy  [facility] for tasks assessed/performed       Past Medical History:  Diagnosis Date  . Anemia   . Anxiety   . Asthma    as child  . Endometritis 01/11/2015  . GERD (gastroesophageal reflux disease)   . History of ovarian cyst 01/28/2015  . IBS (irritable bowel syndrome)   . Nausea and vomiting 01/11/2015  . Ovarian cyst 01/14/2015  . Pelvic pain in female 01/11/2015  . Pregnancy induced hypertension    with pregnancy; no meds now.  . Pregnant 02/05/2016    Past Surgical History:  Procedure Laterality Date  . CESAREAN SECTION N/A 09/29/2014   Procedure: CESAREAN SECTION;  Surgeon: Jonnie Kind, MD;  Location: [facility];  Service: Obstetrics;  Laterality: N/A;  . CESAREAN SECTION N/A 09/25/2016   Procedure: CESAREAN SECTION;  Surgeon: Jonnie Kind, MD;  Location: [facility];  Service: Obstetrics;  Laterality: N/A;  . COLONOSCOPY WITH PROPOFOL N/A 08/02/2017   Dr. Gala Romney: moderate internal hemorhoids, distal 5 cm of TI also appeared normal  . ESOPHAGOGASTRODUODENOSCOPY (EGD) WITH ESOPHAGEAL DILATION N/A 07/19/2013   Dr. Rourk:normal appearing esophagus s/p dilation, normal D1 and D2, unremarkable path   . ESOPHAGOGASTRODUODENOSCOPY (EGD) WITH PROPOFOL N/A 08/02/2017   Normal  esophagus, small hiatal hernia, normal duodenum  . none    . WISDOM TOOTH EXTRACTION      There were no vitals filed for this visit.   Subjective Assessment - 01/18/18 1344    Subjective  Ms. 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  She went through rehab in the [facility] and the pain subsided.  She did not have any more problem with her knee until she went on birth control and gained weight due to the birth control.  At this time she noticed that her knee constantly aches and the more she is up on her knee the more it throbs.  She also has stiffness in her knee in the mornings.  She went to the MD who has referred her to physical therapy.     Limitations  Sitting;Lifting;Standing;Walking;House hold activities    How long can you sit comfortably?  less than five minutes     How long can you stand comfortably?  five minutes     How long can you walk comfortably?  ten minutes     Patient Stated Goals  less pain ; waking up from pain 3 x a week; to be able to sit , stand and walk longer and go up steps without pain.    Currently in Pain?  Yes  Pain Score  6  goes up to a 10/10     Pain Location  Knee    Pain Orientation  Left    Pain Descriptors / Indicators  Aching;Throbbing    Pain Type  Chronic pain    Pain Onset  More than a month ago    Pain Frequency  Constant    Aggravating Factors   weight bearing     Pain Relieving Factors  nothing          [facility] PT Assessment - 01/18/18 0001      Assessment   Medical Diagnosis  Lt knee pain    Referring Provider  Sanjuana Kava    Onset Date/Surgical Date  01/07/17    Next MD Visit  01/27/2018    [facility]  at [facility]       Precautions   Precautions  None      Restrictions   Weight Bearing Restrictions  No      Balance Screen   Has the patient fallen in the past 6 months  No    Has the patient had a decrease in activity level because of a fear of falling?   Yes    Is the patient reluctant  to leave their home because of a fear of falling?   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     Leisure  swimming.      Cognition   Overall Cognitive Status  Within Functional Limits for tasks assessed      Observation/Other Assessments   Focus on Therapeutic Outcomes (FOTO)   43      Functional Tests   Functional tests  Single leg stance;Sit to Stand      Single Leg Stance   Comments  LT 8 seconds; RT 60       Sit to Stand   Comments  5 x 13. 51       ROM / Strength   AROM / PROM / Strength  AROM;Strength      AROM   AROM Assessment Site  Knee;Ankle;Hip    Right/Left Hip  Left    Right/Left Knee  Left    Left Knee Extension  0    Left Knee Flexion  132    Right/Left Ankle  Left      Strength   Strength Assessment Site  Hip;Knee;Ankle Rt LE all 5/5     Right/Left Hip  Left    Left Hip Flexion  3/5    Left Hip Extension  3/5    Left Hip ABduction  3/5    Right/Left Knee  Left    Left Knee Flexion  4/5    Left Knee Extension  4/5    Right/Left Ankle  Left    Left Ankle Dorsiflexion  4/5      Flexibility   Soft Tissue Assessment /Muscle Length  yes    Hamstrings  RT:  160     LT 150    Quadriceps  RT heel 3 " from buttock ; Lt 4"       Ambulation/Gait   Ambulation  Distance (Feet)  442 Feet    Assistive device  None    Gait Comments  3 minute ; antalgic              Objective measurements completed on examination: See above findings.      [facility] PT Treatment/Exercise - 01/18/18 0001      Exercises   Exercises  Knee/Hip      Knee/Hip Exercises: Stretches   Active Hamstring Stretch  2 reps;30 seconds      Knee/Hip Exercises: Supine   Quad Sets  Left;5  reps    Straight Leg Raises  Left;5 reps      Knee/Hip Exercises: Sidelying   Hip ABduction  Left;5 reps      Knee/Hip Exercises: Prone   Hip Extension  Left;5 sets             PT Education - 01/18/18 1436    Education provided  Yes    Education Details  HEP    Person(s) Educated  Patient    Methods  Explanation    Comprehension  Verbalized understanding       PT Short Term Goals - 01/18/18 1443      PT SHORT TERM GOAL #1   Title  Pt left knee  pain to decrease to no greater than a 5/10 to allow pt not to wake throughout the night due to pain.     Time  2    Period  Weeks    Status  New    Target Date  02/07/18      PT SHORT TERM GOAL #2   Title  PT to be able to walk for 30 minutes without increased right knee pain to allow pt to complete short shopping trips.     Time  2    Period  Weeks    Status  New      PT SHORT TERM GOAL #3   Title  PT to be able to sit for 30 minutes without increased right knee pain to be able to eat her meals without discomfort.     Time  2    Period  Weeks      PT SHORT TERM GOAL #4   Title  PT Single leg stance on her left leg to be at least 30 seconds to allow her to feel more stable walking on uneven terrain    Time  2    Period  Weeks    Status  New      PT SHORT TERM GOAL #5   Title  PT Left leg strength to be increased 1/2 grade to allow pt to come from sit to stand from lower levels,(ie couch, car) without difficulty     Time  2    Period  Weeks    Status  New        PT Long Term Goals - 01/18/18 1553      PT LONG TERM GOAL #1   Title  Pt pain level in her left knee to be no greater than a 2/10 so she is no longer waking up in the middle of the night.     Time  4    Period  Weeks    Status  New    Target Date  02/21/18      PT LONG TERM GOAL #2   Title  PT single leg stance on her Lt leg to be at least 45 seconds to allow pt to  feel comfortable walking on uneven ground.    Time  4    Period  Weeks    Status   New      PT LONG TERM GOAL #3   Title  Leg mm in pt Lt leg to be increased one grade to allow pt to go up and down steps in a reciprocal manner with pain no greater than a 2/10     Time  4    Period  Weeks    Status  New      PT LONG TERM GOAL #4   Title  PT to be able to sit in a car for 2 hour without increased left knee pain for traveling.     Time  4    Period  Weeks    Status  New      PT LONG TERM GOAL #5   Title  Pt to be able to squat and return from a squatted postiiton to perform work and housework activity.    Time  4    Period  Weeks    Status  New             Plan - 01/18/18 1438    Clinical Impression Statement  Ms. Demedeiros is a 22 yo female who initially injured her Lt knee years ago at basic training.  She went through physical therapy during basic and got to the point where her knee no longer bothered her.   She went on birth control last year and has been steadily gaining weight.  She has noticed with the increased weight gain she is having increaed knee pain with functional activities therefore she went to the MD who referred her to therapy.     Clinical Presentation  Stable    Clinical Decision Making  Low    Rehab Potential  Good    PT Frequency  2x / week    PT Duration  4 weeks    PT Treatment/Interventions  ADLs/Self Care Home Management;Functional mobility training;Stair training;Gait training;Therapeutic activities;Therapeutic exercise;Balance training;Neuromuscular re-education;Cognitive remediation;Manual techniques;Patient/family education    PT Next Visit Plan  Begin rockerboard, heel raises, functional squat, lunges, forward and lateral step ups and sit to stands .  Progress strengthening as pt is able.     PT Home Exercise Plan  quad set, SLR, sidelying abduction, Lt hip extension, heel raises functional squat.     Consulted and Agree with Plan of Care  Patient       Patient will benefit from skilled therapeutic intervention in order to improve  the following deficits and impairments:  Abnormal gait, Decreased activity tolerance, Decreased balance, Decreased strength, Difficulty walking, Impaired perceived functional ability, Impaired flexibility, Obesity, Pain  Visit Diagnosis: Chronic pain of left knee - Plan: PT plan of care cert/re-cert  Muscle weakness (generalized) - Plan: PT plan of care cert/re-cert     Problem List Patient Active Problem List   Diagnosis Date Noted  . Nexplanon in place 01/03/2018  . Abdominal pain, chronic, generalized 12/13/2017  . Acute left-sided low back pain with left-sided sciatica 01/09/2017  . Morbid obesity ([place]) 01/09/2017  . Encounter for well woman exam with routine gynecological exam 11/03/2016  . Muscle spasm 11/03/2016  . S/P cesarean section 03/04/2016  . Abnormal Pap smear of cervix 03/04/2016  . Pyelonephritis, acute 06/13/2015  . 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 Hypokalemia 06/13/2015  . Normocytic anemia 06/13/2015  . Total bilirubin, elevated 06/13/2015  . Hyponatremia 06/13/2015  .  Hypomagnesemia 06/13/2015  . Dyspepsia 05/08/2015  . Dysphagia, pharyngoesophageal phase 05/08/2015  . Hematochezia 05/08/2015  . Oliguria 04/23/2015  . History of gestational hypertension 04/23/2015  . Ovarian cyst 01/14/2015  . Pelvic pain in female 01/11/2015  . Nausea and vomiting 01/11/2015  . Endometritis 01/11/2015  . Rectal bleeding 07/18/2013  . GERD (gastroesophageal reflux disease) 07/18/2013  . Esophageal dysphagia 07/18/2013  . Abdominal pain, epigastric 07/18/2013  . Gastritis 07/03/2013  . Irritable bowel syndrome 07/03/2013  . Depression 07/03/2013  . Asthma 03/27/2013  . FRACTURE, TOE 02/22/2008    Rayetta Humphrey, PT CLT [phone] 01/18/2018, 4:01 PM  [facility] [address] [place], [place], [address] Phone: [phone]   Fax:  [phone]  Name: JANAYIA BURGGRAF MRN: [id number] Date of Birth:  25-Jun-1996

## 2018-01-18 NOTE — Progress Notes (Signed)
Perrin: your HIDA scan showed a normal ejection fraction of your gallbladder. It looks like you did have symptoms with the ensure. I would like to make sure we are maximizing your bowel regimen and ensuring constipation is not playing a role in any of this. The gallbladder can be blamed for things a lot that may not be entirely related to it. We can talk about pursuing a surgical evaluation at your next appointment. I will see you on the 27th!

## 2018-01-18 NOTE — Patient Instructions (Addendum)
Strengthening: Quadriceps Set    Tighten muscles on top of thighs by pushing knees down into surface. Hold __5__ seconds. Repeat ___10_ times per set. Do ____1 sets per session. Do __3__ sessions per day.  http://orth.exer.us/602   Copyright  VHI. All rights reserved.  Strengthening: Straight Leg Raise (Phase 1)    Tighten muscles on front of left  thigh, then lift leg __18__ inches from surface, keeping knee locked.  Repeat _10___ times per set. Do ___1_ sets per session. Do __2__ sessions per day.  http://orth.exer.us/614   Copyright  VHI. All rights reserved.  Strengthening: Hip Abduction (Side-Lying)    Tighten muscles on front of left thigh, then lift leg _15___ inches from surface, keeping knee locked.  Repeat _10___ times per set. Do __1__ sets per session. Do __2__ sessions per day.  http://orth.exer.us/622   Copyright  VHI. All rights reserved.  Strengthening: Hip Extension (Prone)    Tighten muscles on front of left thigh, then lift leg 4____ inches from surface, keeping knee locked. Repeat __10__ times per set. Do ___1_ sets per session. Do __2__ sessions per day.  http://orth.exer.us/620   Copyright  VHI. All rights reserved.  Heel Raise: Bilateral (Standing)    Rise on balls of feet. Repeat ___10_ times per set. Do __1__ sets per session. Do __3__ sessions per day.  http://orth.exer.us/38   Copyright  VHI. All rights reserved.  Functional Quadriceps: Chair Squat    Keeping feet flat on floor, shoulder width apart, squat as low as is comfortable. Use support as necessary. Repeat ___10_ times per set. Do _1___ sets per session. Do _2___ sessions per day.  http://orth.exer.us/736   Copyright  VHI. All rights reserved.  Stretching: Hamstring (Supine)    Supporting left thigh behind knee, slowly straighten knee until stretch is felt in back of thigh. Hold _30___ seconds. Repeat __3__ times per set. Do __1__ sets per session. Do ___2_  sessions per day.  http://orth.exer.us/656   Copyright  VHI. All rights reserved.

## 2018-01-26 ENCOUNTER — Ambulatory Visit (HOSPITAL_COMMUNITY): Payer: 59

## 2018-01-26 ENCOUNTER — Telehealth (HOSPITAL_COMMUNITY): Payer: Self-pay

## 2018-01-26 NOTE — Telephone Encounter (Signed)
Patient called to cancel for today she is not feeling well

## 2018-01-27 ENCOUNTER — Ambulatory Visit: Payer: 59 | Admitting: Orthopaedic Surgery

## 2018-01-27 ENCOUNTER — Ambulatory Visit: Payer: 59 | Admitting: Family Medicine

## 2018-01-27 ENCOUNTER — Encounter: Payer: Self-pay | Admitting: Family Medicine

## 2018-01-27 VITALS — Temp 98.1°F | Ht 65.0 in | Wt 206.8 lb

## 2018-01-27 DIAGNOSIS — J019 Acute sinusitis, unspecified: Secondary | ICD-10-CM

## 2018-01-27 DIAGNOSIS — B9689 Other specified bacterial agents as the cause of diseases classified elsewhere: Secondary | ICD-10-CM | POA: Diagnosis not present

## 2018-01-27 MED ORDER — AMOXICILLIN-POT CLAVULANATE 875-125 MG PO TABS: 1.0000 | ORAL_TABLET | Freq: Two times a day (BID) | ORAL | 0 refills | Status: DC

## 2018-01-27 NOTE — Progress Notes (Signed)
   Subjective:    Patient ID: Melissa Livingston, female    DOB: December 01, 1996, 22 y.o.   MRN: [id number]  Cough  This is a new problem. The current episode started in the past 7 days. Associated symptoms include ear pain, headaches, nasal congestion and rhinorrhea. Pertinent negatives include no chest pain, fever, shortness of breath or wheezing. Treatments tried: [place].   Head congestion drainage coughing sinus pressure symptoms over the past week worse over the past couple days denies sweats chills high fever wheezing difficulty breathing   Review of Systems  Constitutional: Negative for activity change and fever.  HENT: Positive for congestion, ear pain and rhinorrhea.   Eyes: Negative for discharge.  Respiratory: Positive for cough. Negative for shortness of breath and wheezing.   Cardiovascular: Negative for chest pain.  Neurological: Positive for headaches.       Objective:   Physical Exam  Constitutional: She appears well-developed.  HENT:  Head: Normocephalic.  Right Ear: External ear normal.  Left Ear: External ear normal.  Nose: Nose normal.  Mouth/Throat: Oropharynx is clear and moist. No oropharyngeal exudate.  Eyes: Right eye exhibits no discharge. Left eye exhibits no discharge.  Neck: Neck supple. No tracheal deviation present.  Cardiovascular: Normal rate and normal heart sounds.  No murmur heard. Pulmonary/Chest: Effort normal and breath sounds normal. She has no wheezes. She has no rales.  Lymphadenopathy:    She has no cervical adenopathy.  Skin: Skin is warm and dry.  Nursing note and vitals reviewed.         Assessment & Plan:  Viral syndrome Sinusitis Antibiotics warnings discussed Follow-up if problems

## 2018-01-28 ENCOUNTER — Ambulatory Visit (HOSPITAL_COMMUNITY): Payer: 59 | Admitting: Physical Therapy

## 2018-01-28 ENCOUNTER — Telehealth (HOSPITAL_COMMUNITY): Payer: Self-pay | Admitting: Physical Therapy

## 2018-01-28 NOTE — Telephone Encounter (Signed)
She is still sick and will try to come next week

## 2018-02-01 ENCOUNTER — Ambulatory Visit (INDEPENDENT_AMBULATORY_CARE_PROVIDER_SITE_OTHER): Payer: 59 | Admitting: Orthopaedic Surgery

## 2018-02-01 ENCOUNTER — Encounter (HOSPITAL_COMMUNITY): Payer: Self-pay

## 2018-02-01 ENCOUNTER — Telehealth (HOSPITAL_COMMUNITY): Payer: Self-pay | Admitting: Family Medicine

## 2018-02-01 ENCOUNTER — Encounter: Payer: Self-pay | Admitting: Orthopaedic Surgery

## 2018-02-01 ENCOUNTER — Ambulatory Visit (HOSPITAL_COMMUNITY): Payer: 59

## 2018-02-01 VITALS — BP 131/72 | HR 82 | Temp 98.2°F | Ht 63.0 in | Wt 205.0 lb

## 2018-02-01 DIAGNOSIS — M6281 Muscle weakness (generalized): Secondary | ICD-10-CM

## 2018-02-01 DIAGNOSIS — G8929 Other chronic pain: Secondary | ICD-10-CM

## 2018-02-01 DIAGNOSIS — M25562 Pain in left knee: Secondary | ICD-10-CM

## 2018-02-01 NOTE — Telephone Encounter (Signed)
02/01/18  Pt called to see if she could come in later today and I moved her to 4:45 today

## 2018-02-01 NOTE — Therapy (Signed)
[place], [place], [address] Phone: [phone]   Fax:  [phone]  Physical Therapy Treatment  Patient Details  Name: Melissa Livingston MRN: [id number] Date of Birth: 11/06/1996 Referring Provider: Sanjuana Kava   Encounter Date: 02/01/2018  PT End of Session - 02/01/18 1705    Visit Number  2    Number of Visits  8    Date for PT Re-Evaluation  02/17/18    Authorization Type  secondary medicaid     Authorization - Visit Number  2    Authorization - Number of Visits  8    PT Start Time  6384    PT Stop Time  1730    PT Time Calculation (min)  38 min    Activity Tolerance  Patient tolerated treatment well;Patient limited by pain    Behavior During Therapy  [facility] for tasks assessed/performed       Past Medical History:  Diagnosis Date  . Anemia   . Anxiety   . Asthma    as child  . Endometritis 01/11/2015  . GERD (gastroesophageal reflux disease)   . History of ovarian cyst 01/28/2015  . IBS (irritable bowel syndrome)   . Nausea and vomiting 01/11/2015  . Ovarian cyst 01/14/2015  . Pelvic pain in female 01/11/2015  . Pregnancy induced hypertension    with pregnancy; no meds now.  . Pregnant 02/05/2016    Past Surgical History:  Procedure Laterality Date  . CESAREAN SECTION N/A 09/29/2014   Procedure: CESAREAN SECTION;  Surgeon: Jonnie Kind, MD;  Location: [facility];  Service: Obstetrics;  Laterality: N/A;  . CESAREAN SECTION N/A 09/25/2016   Procedure: CESAREAN SECTION;  Surgeon: Jonnie Kind, MD;  Location: [place];  Service: Obstetrics;  Laterality: N/A;  . COLONOSCOPY WITH PROPOFOL N/A 08/02/2017   Dr. Gala Romney: moderate internal hemorhoids, distal 5 cm of TI also appeared normal  . ESOPHAGOGASTRODUODENOSCOPY (EGD) WITH ESOPHAGEAL DILATION N/A 07/19/2013   Dr. Rourk:normal appearing esophagus s/p dilation, normal D1 and D2, unremarkable path   . ESOPHAGOGASTRODUODENOSCOPY (EGD) WITH PROPOFOL N/A  08/02/2017   Normal esophagus, small hiatal hernia, normal duodenum  . none    . WISDOM TOOTH EXTRACTION      There were no vitals filed for this visit.  Subjective Assessment - 02/01/18 1654    Subjective  Pt stated knee is feeling better, continues to have dull achey pain Lt knee, current pain scale 6/10 today.    Patient Stated Goals  less pain ; waking up from pain 3 x a week; to be able to sit , stand and walk longer and go up steps without pain.    Currently in Pain?  Yes    Pain Score  6     Pain Location  Knee    Pain Orientation  Left    Pain Descriptors / Indicators  Aching;Dull    Pain Type  Chronic pain    Pain Onset  More than a month ago    Pain Frequency  Constant    Aggravating Factors   weight bearing    Pain Relieving Factors  nothing    Effect of Pain on Daily Activities  decreased tolerance for working/standing                      [facility] Adult PT Treatment/Exercise - 02/01/18 0001      Knee/Hip Exercises: Standing   Heel Raises  10  reps Toe raises 10x    Forward Lunges  Both;10 reps 4in step    Lateral Step Up  Left;10 reps;Hand Hold: 0;Step Height: 4" limited by pain    Forward Step Up  Left;10 reps;Hand Hold: 0;Step Height: 4" limited by pain with movement    Functional Squat  10 reps mat tapping for proper form    Rocker Board  2 minutes lateral and DF/PF      Knee/Hip Exercises: Seated   Sit to Sand  5 reps;without UE support             PT Education - 02/01/18 1707    Education provided  Yes    Education Details  Reviewed goals, assured compliance iwth HEP and proper form/technique, copy of eval given to pt    Person(s) Educated  Patient    Methods  Explanation;Demonstration;Handout    Comprehension  Verbalized understanding;Returned demonstration       PT Short Term Goals - 01/18/18 1443      PT SHORT TERM GOAL #1   Title  Pt left knee  pain to decrease to no greater than a 5/10 to allow pt not to wake throughout the  night due to pain.     Time  2    Period  Weeks    Status  New    [place] Date  02/07/18      PT SHORT TERM GOAL #2   Title  PT to be able to walk for 30 minutes without increased right knee pain to allow pt to complete short shopping trips.     Time  2    Period  Weeks    Status  New      PT SHORT TERM GOAL #3   Title  PT to be able to sit for 30 minutes without increased right knee pain to be able to eat her meals without discomfort.     Time  2    Period  Weeks      PT SHORT TERM GOAL #4   Title  PT Single leg stance on her left leg to be at least 30 seconds to allow her to feel more stable walking on uneven terrain    Time  2    Period  Weeks    Status  New      PT SHORT TERM GOAL #5   Title  PT Left leg strength to be increased 1/2 grade to allow pt to come from sit to stand from lower levels,(ie couch, car) without difficulty     Time  2    Period  Weeks    Status  New        PT Long Term Goals - 01/18/18 1553      PT LONG TERM GOAL #1   Title  Pt pain level in her left knee to be no greater than a 2/10 so she is no longer waking up in the middle of the night.     Time  4    Period  Weeks    Status  New    Target Date  02/21/18      PT LONG TERM GOAL #2   Title  PT single leg stance on her Lt leg to be at least 45 seconds to allow pt to feel comfortable walking on uneven ground.    Time  4    Period  Weeks    Status  New      PT LONG TERM  GOAL #3   Title  Leg mm in pt Lt leg to be increased one grade to allow pt to go up and down steps in a reciprocal manner with pain no greater than a 2/10     Time  4    Period  Weeks    Status  New      PT LONG TERM GOAL #4   Title  PT to be able to sit in a car for 2 hour without increased left knee pain for traveling.     Time  4    Period  Weeks    Status  New      PT LONG TERM GOAL #5   Title  Pt to be able to squat and return from a squatted postiiton to perform work and housework activity.    Time  4     Period  Weeks    Status  New            Plan - 02/01/18 1723    Clinical Impression Statement  Reviewed goals, assured compliance with HEP and proper form (encouraged to complete squats infront of chair) and copy of eval given to pt.  Progressed functional strengthening with CKC, pt able to demonstrate appropriate form with minimal cueing required for technique.  Pt does c/o pain through session, especially with patella mobs all direcitons medial>lateral.  Educated with RICE techniques for pain and edema control.      Rehab Potential  Good    PT Frequency  2x / week    PT Duration  4 weeks    PT Treatment/Interventions  ADLs/Self Care Home Management;Functional mobility training;Stair training;Gait training;Therapeutic activities;Therapeutic exercise;Balance training;Neuromuscular re-education;Cognitive remediation;Manual techniques;Patient/family education    PT Next Visit Plan  Continue functional strengthening as able.  Begin SLS next session. Progress strengthening as pt is able.     PT Home Exercise Plan  quad set, SLR, sidelying abduction, Lt hip extension, heel raises functional squat.        Patient will benefit from skilled therapeutic intervention in order to improve the following deficits and impairments:  Abnormal gait, Decreased activity tolerance, Decreased balance, Decreased strength, Difficulty walking, Impaired perceived functional ability, Impaired flexibility, Obesity, Pain  Visit Diagnosis: Chronic pain of left knee  Muscle weakness (generalized)     Problem List Patient Active Problem List   Diagnosis Date Noted  . Nexplanon in place 01/03/2018  . Abdominal pain, chronic, generalized 12/13/2017  . Acute left-sided low back pain with left-sided sciatica 01/09/2017  . Morbid obesity ([place]) 01/09/2017  . Encounter for well woman exam with routine gynecological exam 11/03/2016  . Muscle spasm 11/03/2016  . S/P cesarean section 03/04/2016  . Abnormal Pap smear  of cervix 03/04/2016  . Pyelonephritis, acute 06/13/2015  . 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 Hypokalemia 06/13/2015  . Normocytic anemia 06/13/2015  . Total bilirubin, elevated 06/13/2015  . Hyponatremia 06/13/2015  . Hypomagnesemia 06/13/2015  . Dyspepsia 05/08/2015  . Dysphagia, pharyngoesophageal phase 05/08/2015  . Hematochezia 05/08/2015  . Oliguria 04/23/2015  . History of gestational hypertension 04/23/2015  . Ovarian cyst 01/14/2015  . Pelvic pain in female 01/11/2015  . Nausea and vomiting 01/11/2015  . Endometritis 01/11/2015  . Rectal bleeding 07/18/2013  . GERD (gastroesophageal reflux disease) 07/18/2013  . Esophageal dysphagia 07/18/2013  . Abdominal pain, epigastric 07/18/2013  . Gastritis 07/03/2013  . Irritable bowel syndrome 07/03/2013  . Depression 07/03/2013  . Asthma 03/27/2013  . FRACTURE, TOE 02/22/2008  [address] LPTA; [place]  Aldona Lento 02/01/2018, 5:47 PM  [facility] [address] [place], [place], [address] Phone: [phone]   Fax:  [phone]  Name: MAYBEL DAMBROSIO MRN: [id number] Date of Birth: 09-Mar-1996

## 2018-02-01 NOTE — Progress Notes (Signed)
Patient Melissa Livingston, female DOB:05-07-1996, 22 y.o. RKY:[id number]  Chief Complaint  Patient presents with  . Knee Pain    left    HPI  Melissa Livingston is a 22 y.o. female who has continued pain of the left knee and more popping.  She has no giving way.  She has been to PT once.  She has no new trauma.  She has swelling.  HPI  Body mass index is 36.31 kg/m.  ROS  Review of Systems  HENT: Negative for congestion.   Respiratory: Negative for cough and shortness of breath.   Cardiovascular: Negative for chest pain and leg swelling.  Musculoskeletal: Positive for arthralgias, gait problem and joint swelling.  All other systems reviewed and are negative.   Past Medical History:  Diagnosis Date  . Anemia   . Anxiety   . Asthma    as child  . Endometritis 01/11/2015  . GERD (gastroesophageal reflux disease)   . History of ovarian cyst 01/28/2015  . IBS (irritable bowel syndrome)   . Nausea and vomiting 01/11/2015  . Ovarian cyst 01/14/2015  . Pelvic pain in female 01/11/2015  . Pregnancy induced hypertension    with pregnancy; no meds now.  . Pregnant 02/05/2016    Past Surgical History:  Procedure Laterality Date  . CESAREAN SECTION N/A 09/29/2014   Procedure: CESAREAN SECTION;  Surgeon: Jonnie Kind, MD;  Location: [facility];  Service: Obstetrics;  Laterality: N/A;  . CESAREAN SECTION N/A 09/25/2016   Procedure: CESAREAN SECTION;  Surgeon: Jonnie Kind, MD;  Location: [facility];  Service: Obstetrics;  Laterality: N/A;  . COLONOSCOPY WITH PROPOFOL N/A 08/02/2017   Dr. Gala Romney: moderate internal hemorhoids, distal 5 cm of TI also appeared normal  . ESOPHAGOGASTRODUODENOSCOPY (EGD) WITH ESOPHAGEAL DILATION N/A 07/19/2013   Dr. Rourk:normal appearing esophagus s/p dilation, normal D1 and D2, unremarkable path   . ESOPHAGOGASTRODUODENOSCOPY (EGD) WITH PROPOFOL N/A 08/02/2017   Normal esophagus, small hiatal hernia, normal duodenum  . none    . WISDOM TOOTH EXTRACTION       Family History  Problem Relation Age of Onset  . Multiple sclerosis Mother   . Heart disease Maternal Grandfather   . Heart disease Paternal Uncle   . Diabetes Paternal Uncle   . Hypertension Paternal Uncle   . Cancer Other        father's aunt had breast cancer  . Colon cancer Other        maternal great uncle, age greater than 46  . Crohn's disease Other        couple of family members on father's side of family    Social History Social History   Tobacco Use  . Smoking status: Never Smoker  . Smokeless tobacco: Never Used  Substance Use Topics  . Alcohol use: No    Alcohol/week: 0.0 oz  . Drug use: No    Allergies  Allergen Reactions  . Adhesive [Tape] Other (See Comments)    Reaction:  Blisters   . Lorabid [Loracarbef] Hives, Swelling and Other (See Comments)    Reaction:  Mouth swelling   . Latex Itching and Rash  . Orange Fruit [Citrus] Rash    Current Outpatient Medications  Medication Sig Dispense Refill  . acetaminophen (TYLENOL) 325 MG tablet Take 650 mg by mouth every 6 (six) hours as needed for mild pain, moderate pain or headache.     Marland Kitchen amoxicillin-clavulanate (AUGMENTIN) 875-125 MG tablet Take 1 tablet by mouth 2 (two)  times daily. 20 tablet 0  . dexlansoprazole (DEXILANT) 60 MG capsule Take 1 capsule (60 mg total) by mouth daily before breakfast. 30 capsule 3  . diclofenac (VOLTAREN) 75 MG EC tablet Take 1 tablet (75 mg total) by mouth 2 (two) times daily. (Patient not taking: Reported on 01/03/2018) 60 tablet 1  . dicyclomine (BENTYL) 20 MG tablet Take 1 tablet (20 mg total) by mouth 3 (three) times daily as needed. (Patient taking differently: Take 20 mg by mouth 3 (three) times daily as needed for spasms. ) 30 tablet 1  . etonogestrel (NEXPLANON) 68 MG IMPL implant 1 each by Subdermal route once.    . Iron-FA-B Cmp-C-Biot-Probiotic (FUSION PLUS) CAPS Take 1 capsule by mouth daily.    . naproxen (NAPROSYN) 500 MG tablet Take 1 tablet (500 mg total)  by mouth 2 (two) times daily with a meal. 60 tablet 5   No current facility-administered medications for this visit.      Physical Exam  Blood pressure 131/72, pulse 82, temperature 98.2 F (36.8 C), height 5\' 3"  (1.6 m), weight 205 lb (93 kg), not currently breastfeeding.  Constitutional: overall normal hygiene, normal nutrition, well developed, normal grooming, normal body habitus. Assistive device:none  Musculoskeletal: gait and station Limp left, muscle tone and strength are normal, no tremors or atrophy is present.  .  Neurological: coordination overall normal.  Deep tendon reflex/nerve stretch intact.  Sensation normal.  Cranial nerves II-XII intact.   Skin:   Normal overall no scars, lesions, ulcers or rashes. No psoriasis.  Psychiatric: Alert and oriented x 3.  Recent memory intact, remote memory unclear.  Normal mood and affect. Well groomed.  Good eye contact.  Cardiovascular: overall no swelling, no varicosities, no edema bilaterally, normal temperatures of the legs and arms, no clubbing, cyanosis and good capillary refill.  Lymphatic: palpation is normal.  The left lower extremity is examined:  Inspection:  Thigh:  Non-tender and no defects  Knee has swelling 1+ effusion.                        Joint tenderness is present                        Patient is tender over the medial joint line  Lower Leg:  Has normal appearance and no tenderness or defects  Ankle:  Non-tender and no defects  Foot:  Non-tender and no defects Range of Motion:  Knee:  Range of motion is: 0-110                        Crepitus is  present  Ankle:  Range of motion is normal. Strength and Tone:  The left lower extremity has normal strength and tone. Stability:  Knee:  The knee has weakly positive medial Mcmurray.  Ankle:  The ankle is stable.   All other systems reviewed and are negative   The patient has been educated about the nature of the problem(s) and counseled on treatment  options.  The patient appeared to understand what I have discussed and is in agreement with it.  Encounter Diagnosis  Name Primary?  . Chronic pain of left knee Yes    PLAN Call if any problems.  Precautions discussed.  Continue current medications.   Return to clinic 3 weeks   Consider MRI if not improved.  Electronically Signed Sanjuana Kava, MD 2/26/20192:53 PM

## 2018-02-02 ENCOUNTER — Ambulatory Visit (INDEPENDENT_AMBULATORY_CARE_PROVIDER_SITE_OTHER): Payer: 59 | Admitting: Gastroenterology

## 2018-02-02 ENCOUNTER — Encounter: Payer: Self-pay | Admitting: Gastroenterology

## 2018-02-02 ENCOUNTER — Other Ambulatory Visit: Payer: Self-pay | Admitting: *Deleted

## 2018-02-02 VITALS — BP 128/75 | HR 95 | Temp 99.0°F | Ht 63.0 in | Wt 204.4 lb

## 2018-02-02 DIAGNOSIS — R1013 Epigastric pain: Secondary | ICD-10-CM | POA: Diagnosis not present

## 2018-02-02 MED ORDER — LINACLOTIDE 290 MCG PO CAPS: 290.0000 ug | ORAL_CAPSULE | Freq: Every day | ORAL | 3 refills | Status: DC

## 2018-02-02 NOTE — Patient Instructions (Signed)
I am referring you to the surgeon (Dr. Arnoldo Morale or Dr. Constance Haw) to discuss benefits of removing your gallbladder. As we discussed, sometimes this does not relieve your symptoms. However, we will see what they say!  For constipation: continue Linzess but make sure it is every day, 30 minutes before breakfast that you are taking this. It works best when taken daily. My goal for you is to have 3-4 soft bowel movements a week. I feel this would help with some of your symptoms.   We will see you in 4 months!  Good luck with job hunting!  It was a pleasure to see you today. I strive to create trusting relationships with patients to provide genuine, compassionate, and quality care. I value your feedback. If you receive a survey regarding your visit,  I greatly appreciate you taking time to fill this out.   Annitta Needs, PhD, ANP-BC [facility]

## 2018-02-02 NOTE — Progress Notes (Signed)
cc'ed to pcp °

## 2018-02-02 NOTE — Progress Notes (Signed)
Referring Provider: Kathyrn Drown, MD Primary Care Physician:  Kathyrn Drown, MD Primary GI: Dr. Gala Romney   Chief Complaint  Patient presents with  . Abdominal Pain    mid abd and mid upper abd  . Nausea    HPI:   Melissa Livingston is a 22 y.o. female presenting today with a history of chronic abdominal pain, rectal bleeding, GERD. Colonoscopy and EGD recently updated. Internal hemorrhoids on colonoscopy, otherwise normal. EGD essentially normal. Gallbladder present.  Last visit in Jan with constipation. Given Linzess 290 mcg samples to try. She has been taking it only as needed. BM ever 3-4 days. Pain not associated with relief of constipation. Feels like constipation is better but not ideal.   Abdominal pain: LFTs normal. US abdomen unremarkable. HIDA scan then completed with EF of 42%, and she experienced pain with oral Ensure. States epigastric pain is always underlying like a dull ache, then worsens with eating and associated with nausea. Has severe indigestion at times. No weight loss noted.   Plan was to maximize bowel regimen prior to referring to general surgery. She understands that even if she undergoes a cholecystectomy, there is a chance she could still be dealing with chronic abdominal pain.   Past Medical History:  Diagnosis Date  . Anemia   . Anxiety   . Asthma    as child  . Endometritis 01/11/2015  . GERD (gastroesophageal reflux disease)   . History of ovarian cyst 01/28/2015  . IBS (irritable bowel syndrome)   . Nausea and vomiting 01/11/2015  . Ovarian cyst 01/14/2015  . Pelvic pain in female 01/11/2015  . Pregnancy induced hypertension    with pregnancy; no meds now.  . Pregnant 02/05/2016    Past Surgical History:  Procedure Laterality Date  . CESAREAN SECTION N/A 09/29/2014   Procedure: CESAREAN SECTION;  Surgeon: Jonnie Kind, MD;  Location: [facility];  Service: Obstetrics;  Laterality: N/A;  . CESAREAN SECTION N/A 09/25/2016   Procedure: CESAREAN SECTION;   Surgeon: Jonnie Kind, MD;  Location: [facility];  Service: Obstetrics;  Laterality: N/A;  . COLONOSCOPY WITH PROPOFOL N/A 08/02/2017   Dr. Gala Romney: moderate internal hemorhoids, distal 5 cm of TI also appeared normal  . ESOPHAGOGASTRODUODENOSCOPY (EGD) WITH ESOPHAGEAL DILATION N/A 07/19/2013   Dr. Rourk:normal appearing esophagus s/p dilation, normal D1 and D2, unremarkable path   . ESOPHAGOGASTRODUODENOSCOPY (EGD) WITH PROPOFOL N/A 08/02/2017   Normal esophagus, small hiatal hernia, normal duodenum  . none    . WISDOM TOOTH EXTRACTION      Current Outpatient Medications  Medication Sig Dispense Refill  . acetaminophen (TYLENOL) 325 MG tablet Take 650 mg by mouth every 6 (six) hours as needed for mild pain, moderate pain or headache.     Marland Kitchen amoxicillin-clavulanate (AUGMENTIN) 875-125 MG tablet Take 1 tablet by mouth 2 (two) times daily. 20 tablet 0  . dexlansoprazole (DEXILANT) 60 MG capsule Take 1 capsule (60 mg total) by mouth daily before breakfast. 30 capsule 3  . dicyclomine (BENTYL) 20 MG tablet Take 1 tablet (20 mg total) by mouth 3 (three) times daily as needed. (Patient taking differently: Take 20 mg by mouth 3 (three) times daily as needed for spasms. ) 30 tablet 1  . etonogestrel (NEXPLANON) 68 MG IMPL implant 1 each by Subdermal route once.    . Iron-FA-B Cmp-C-Biot-Probiotic (FUSION PLUS) CAPS Take 1 capsule by mouth daily.    . naproxen (NAPROSYN) 500 MG tablet Take 1 tablet (  500 mg total) by mouth 2 (two) times daily with a meal. 60 tablet 5  . linaclotide (LINZESS) 290 MCG CAPS capsule Take 1 capsule (290 mcg total) by mouth daily before breakfast. Take 30 minutes 90 capsule 3   No current facility-administered medications for this visit.     Allergies as of 02/02/2018 - Review Complete 02/02/2018  Allergen Reaction Noted  . Adhesive [tape] Other (See Comments) 06/14/2015  . Lorabid [loracarbef] Hives, Swelling, and Other (See Comments) 03/28/2013  . Latex Itching  and Rash 07/18/2013  . Orange fruit [citrus] Rash 07/18/2013    Family History  Problem Relation Age of Onset  . Multiple sclerosis Mother   . Heart disease Maternal Grandfather   . Heart disease Paternal Uncle   . Diabetes Paternal Uncle   . Hypertension Paternal Uncle   . Cancer Other        father's aunt had breast cancer  . Colon cancer Other        maternal great uncle, age greater than 60  . Crohn's disease Other        couple of family members on father's side of family    Social History   Socioeconomic History  . Marital status: Single    Spouse name: None  . Number of children: 2  . Years of education: None  . Highest education level: None  Social Needs  . Financial resource strain: None  . Food insecurity - worry: None  . Food insecurity - inability: None  . Transportation needs - medical: None  . Transportation needs - non-medical: None  Occupational History  . Occupation: out due to knee  Tobacco Use  . Smoking status: Never Smoker  . Smokeless tobacco: Never Used  Substance and Sexual Activity  . Alcohol use: No    Alcohol/week: 0.0 oz  . Drug use: No  . Sexual activity: Yes    Birth control/protection: Implant  Other Topics Concern  . None  Social History Narrative  . None    Review of Systems: Gen: Denies fever, chills, anorexia. Denies fatigue, weakness, weight loss.  CV: Denies chest pain, palpitations, syncope, peripheral edema, and claudication. Resp: Denies dyspnea at rest, cough, wheezing, coughing up blood, and pleurisy. GI:see HPI  Derm: Denies rash, itching, dry skin Psych: Denies depression, anxiety, memory loss, confusion. No homicidal or suicidal ideation.  Heme: Denies bruising, bleeding, and enlarged lymph nodes.  Physical Exam: BP 128/75   Pulse 95   Temp 99 F (37.2 C) (Oral)   Ht 5\' 3"  (1.6 m)   Wt 204 lb 6.4 oz (92.7 kg)   BMI 36.21 kg/m  General:   Alert and oriented. No distress noted. Pleasant and cooperative.    Head:  Normocephalic and atraumatic. Eyes:  Conjuctiva clear without scleral icterus. Mouth:  Oral mucosa pink and moist.  Abdomen:  +BS, soft, significantly TTP upper abdomen and RUQ and non-distended. No rebound or guarding. No HSM or masses noted. Msk:  Symmetrical without gross deformities. Normal posture. Extremities:  Without edema. Neurologic:  Alert and  oriented x4 Psych:  Alert and cooperative. Normal mood and affect.

## 2018-02-02 NOTE — Assessment & Plan Note (Signed)
22 year old female with chronic epigastric/RUQ pain, known history of IBS-C. EGD and colonoscopy recently updated and unrevealing. [place] without gallstones, so I ordered a HIDA scan to assess for biliary dyskinesia. This showed a normal EF of 42%, but she noted pain with ensure during the HIDA scan. She is not utilizing Linzess 290 mcg ideally, as she needs to be taking this daily instead of just as needed. Her bowel habits are slightly improved but not ideal. I discussed with her that there is a chance she could still be dealing with chronic pain even if she undergoes a cholecystectomy, and we needed to continue aggressive bowel regimen by taking Linzess daily instead of as needed. I also discussed limiting Bentyl, as this can cause constipation and is counter-productive (she takes because of abdominal pain). After a thorough discussion, will refer to General Surgery, as she has been thoroughly evaluated here to rule out gastritis, PUD. I do feel IBS is playing a role but unable to rule out a biliary component as well. She is well aware this may not resolve all of her issues but still would like to pursue.   linzess 290 mcg daily: samples and prescription provided Refer to Dr. Renold Genta Return in 4 months Continue Dexilant Limit NSAIDs

## 2018-02-03 ENCOUNTER — Ambulatory Visit (HOSPITAL_COMMUNITY): Payer: 59

## 2018-02-03 ENCOUNTER — Encounter (HOSPITAL_COMMUNITY): Payer: Self-pay

## 2018-02-03 DIAGNOSIS — M25562 Pain in left knee: Principal | ICD-10-CM

## 2018-02-03 DIAGNOSIS — G8929 Other chronic pain: Secondary | ICD-10-CM

## 2018-02-03 DIAGNOSIS — M6281 Muscle weakness (generalized): Secondary | ICD-10-CM

## 2018-02-03 NOTE — Therapy (Signed)
[employer], [place], [address] Phone: [phone]   Fax:  [phone]  Physical Therapy Treatment  Patient Details  Name: Melissa Livingston MRN: [id number] Date of Birth: 26-Sep-1996 Referring Provider: Sanjuana Kava   Encounter Date: 02/03/2018  PT End of Session - 02/03/18 1350    Visit Number  3    Number of Visits  8    Date for PT Re-Evaluation  02/17/18    Authorization Type  secondary medicaid     Authorization - Visit Number  3    Authorization - Number of Visits  8    PT Start Time  9924    PT Stop Time  1428    PT Time Calculation (min)  40 min    Activity Tolerance  Patient tolerated treatment well;Patient limited by pain    Behavior During Therapy  [facility] for tasks assessed/performed       Past Medical History:  Diagnosis Date  . Anemia   . Anxiety   . Asthma    as child  . Endometritis 01/11/2015  . GERD (gastroesophageal reflux disease)   . History of ovarian cyst 01/28/2015  . IBS (irritable bowel syndrome)   . Nausea and vomiting 01/11/2015  . Ovarian cyst 01/14/2015  . Pelvic pain in female 01/11/2015  . Pregnancy induced hypertension    with pregnancy; no meds now.  . Pregnant 02/05/2016    Past Surgical History:  Procedure Laterality Date  . CESAREAN SECTION N/A 09/29/2014   Procedure: CESAREAN SECTION;  Surgeon: Jonnie Kind, MD;  Location: [facility];  Service: Obstetrics;  Laterality: N/A;  . CESAREAN SECTION N/A 09/25/2016   Procedure: CESAREAN SECTION;  Surgeon: Jonnie Kind, MD;  Location: [facility];  Service: Obstetrics;  Laterality: N/A;  . COLONOSCOPY WITH PROPOFOL N/A 08/02/2017   Dr. Gala Romney: moderate internal hemorhoids, distal 5 cm of TI also appeared normal  . ESOPHAGOGASTRODUODENOSCOPY (EGD) WITH ESOPHAGEAL DILATION N/A 07/19/2013   Dr. Rourk:normal appearing esophagus s/p dilation, normal D1 and D2, unremarkable path   . ESOPHAGOGASTRODUODENOSCOPY (EGD) WITH PROPOFOL N/A  08/02/2017   Normal esophagus, small hiatal hernia, normal duodenum  . none    . WISDOM TOOTH EXTRACTION      There were no vitals filed for this visit.  Subjective Assessment - 02/03/18 1346    Subjective  Pt stated she was walking her daughter down sidewalk and misplaced foot, current pain scale 5/10 sore and achey pain.    Patient Stated Goals  less pain ; waking up from pain 3 x a week; to be able to sit , stand and walk longer and go up steps without pain.    Currently in Pain?  Yes    Pain Score  5     Pain Location  Knee    Pain Orientation  Left    Pain Descriptors / Indicators  Aching;Sore    Pain Type  Chronic pain    Pain Onset  More than a month ago    Pain Frequency  Constant    Aggravating Factors   weight bearing     Pain Relieving Factors  nothing    Effect of Pain on Daily Activities  decreased tolerance for working/standing                      [facility] Adult PT Treatment/Exercise - 02/03/18 0001      Knee/Hip Exercises: Standing   Heel Raises  15 reps Toe raises 15x    Forward Lunges  Both;15 reps 4in step    Lateral Step Up  Left;10 reps;Hand Hold: 0;Step Height: 4"    Forward Step Up  Left;10 reps;Hand Hold: 0;Step Height: 4"    Functional Squat  10 reps mat tapping for form c/o pain wiht movement    Wall Squat  10 reps;3 seconds    SLS  Rt 60", Lt 26" max of 3    SLS with Vectors  begin next session      Knee/Hip Exercises: Seated   Sit to Sand  10 reps;without UE support eccentric control      Knee/Hip Exercises: Supine   Short Arc Quad Sets  15 reps    Straight Leg Raises  Left;10 reps               PT Short Term Goals - 01/18/18 1443      PT SHORT TERM GOAL #1   Title  Pt left knee  pain to decrease to no greater than a 5/10 to allow pt not to wake throughout the night due to pain.     Time  2    Period  Weeks    Status  New    Target Date  02/07/18      PT SHORT TERM GOAL #2   Title  PT to be able to walk for 30  minutes without increased right knee pain to allow pt to complete short shopping trips.     Time  2    Period  Weeks    Status  New      PT SHORT TERM GOAL #3   Title  PT to be able to sit for 30 minutes without increased right knee pain to be able to eat her meals without discomfort.     Time  2    Period  Weeks      PT SHORT TERM GOAL #4   Title  PT Single leg stance on her left leg to be at least 30 seconds to allow her to feel more stable walking on uneven terrain    Time  2    Period  Weeks    Status  New      PT SHORT TERM GOAL #5   Title  PT Left leg strength to be increased 1/2 grade to allow pt to come from sit to stand from lower levels,(ie couch, car) without difficulty     Time  2    Period  Weeks    Status  New        PT Long Term Goals - 01/18/18 1553      PT LONG TERM GOAL #1   Title  Pt pain level in her left knee to be no greater than a 2/10 so she is no longer waking up in the middle of the night.     Time  4    Period  Weeks    Status  New    Target Date  02/21/18      PT LONG TERM GOAL #2   Title  PT single leg stance on her Lt leg to be at least 45 seconds to allow pt to feel comfortable walking on uneven ground.    Time  4    Period  Weeks    Status  New      PT LONG TERM GOAL #3   Title  Leg mm in pt Lt leg to be  increased one grade to allow pt to go up and down steps in a reciprocal manner with pain no greater than a 2/10     Time  4    Period  Weeks    Status  New      PT LONG TERM GOAL #4   Title  PT to be able to sit in a car for 2 hour without increased left knee pain for traveling.     Time  4    Period  Weeks    Status  New      PT LONG TERM GOAL #5   Title  Pt to be able to squat and return from a squatted postiiton to perform work and housework activity.    Time  4    Period  Weeks    Status  New            Plan - 02/03/18 1429    Clinical Impression Statement  Progressed functional strengthening wiht additional  standing exercises.  Pt c/o patella popping/pain with squats, trial with wall squats for pain tolerance.  Noted patella instabilty this session, added SAQ and SLR for distal quad strengthening.  Pt may benefits from kinesiotaping next session.  Added SLS and sidestepping for stabilty.  Pt reports slight increased in "productive pain" at EOS.      Rehab Potential  Good    PT Frequency  2x / week    PT Duration  4 weeks    PT Treatment/Interventions  ADLs/Self Care Home Management;Functional mobility training;Stair training;Gait training;Therapeutic activities;Therapeutic exercise;Balance training;Neuromuscular re-education;Cognitive remediation;Manual techniques;Patient/family education    PT Next Visit Plan  Continue functional strengthening as able.  May benefits from kinesiotape to improve patella mobility.    PT Home Exercise Plan  quad set, SLR, sidelying abduction, Lt hip extension, heel raises functional squat.        Patient will benefit from skilled therapeutic intervention in order to improve the following deficits and impairments:  Abnormal gait, Decreased activity tolerance, Decreased balance, Decreased strength, Difficulty walking, Impaired perceived functional ability, Impaired flexibility, Obesity, Pain  Visit Diagnosis: Chronic pain of left knee  Muscle weakness (generalized)     Problem List Patient Active Problem List   Diagnosis Date Noted  . Nexplanon in place 01/03/2018  . Abdominal pain, chronic, generalized 12/13/2017  . Acute left-sided low back pain with left-sided sciatica 01/09/2017  . Morbid obesity ([place]) 01/09/2017  . Encounter for well woman exam with routine gynecological exam 11/03/2016  . Muscle spasm 11/03/2016  . S/P cesarean section 03/04/2016  . Abnormal Pap smear of cervix 03/04/2016  . Pyelonephritis, acute 06/13/2015  . 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 Hypokalemia 06/13/2015  . Normocytic anemia 06/13/2015  . Total bilirubin,  elevated 06/13/2015  . Hyponatremia 06/13/2015  . Hypomagnesemia 06/13/2015  . Dyspepsia 05/08/2015  . Dysphagia, pharyngoesophageal phase 05/08/2015  . Hematochezia 05/08/2015  . Oliguria 04/23/2015  . History of gestational hypertension 04/23/2015  . Ovarian cyst 01/14/2015  . Pelvic pain in female 01/11/2015  . Nausea and vomiting 01/11/2015  . Endometritis 01/11/2015  . Rectal bleeding 07/18/2013  . GERD (gastroesophageal reflux disease) 07/18/2013  . Esophageal dysphagia 07/18/2013  . Abdominal pain, epigastric 07/18/2013  . Gastritis 07/03/2013  . Irritable bowel syndrome 07/03/2013  . Depression 07/03/2013  . Asthma 03/27/2013  . FRACTURE, TOE 02/22/2008   Ihor Austin, LPTA; [place]  Aldona Lento 02/03/2018, 2:32 PM  [facility] [address]  [place], [place], [address] Phone: [phone]   Fax:  [phone]  Name: Melissa Livingston MRN: [id number] Date of Birth: 1996/08/09

## 2018-02-08 ENCOUNTER — Ambulatory Visit (HOSPITAL_COMMUNITY): Payer: 59 | Attending: Orthopaedic Surgery

## 2018-02-08 ENCOUNTER — Encounter (HOSPITAL_COMMUNITY): Payer: Self-pay

## 2018-02-08 DIAGNOSIS — M6281 Muscle weakness (generalized): Secondary | ICD-10-CM | POA: Insufficient documentation

## 2018-02-08 DIAGNOSIS — M25562 Pain in left knee: Secondary | ICD-10-CM | POA: Diagnosis not present

## 2018-02-08 DIAGNOSIS — G8929 Other chronic pain: Secondary | ICD-10-CM | POA: Insufficient documentation

## 2018-02-08 NOTE — Therapy (Signed)
[place], [place], [address] Phone: [phone]   Fax:  [phone]  Physical Therapy Treatment  Patient Details  Name: Melissa Livingston MRN: [id number] Date of Birth: 22-Dec-1995 Referring Provider: Sanjuana Kava   Encounter Date: 02/08/2018  PT End of Session - 02/08/18 1359    Visit Number  4    Number of Visits  8    Date for PT Re-Evaluation  02/17/18    Authorization Type  secondary medicaid     Authorization - Visit Number  4    Authorization - Number of Visits  8    PT Start Time  2119    PT Stop Time  4174    PT Time Calculation (min)  44 min    Activity Tolerance  Patient tolerated treatment well;No increased pain    Behavior During Therapy  W[facility] for tasks assessed/performed       Past Medical History:  Diagnosis Date  . Anemia   . Anxiety   . Asthma    as child  . Endometritis 01/11/2015  . GERD (gastroesophageal reflux disease)   . History of ovarian cyst 01/28/2015  . IBS (irritable bowel syndrome)   . Nausea and vomiting 01/11/2015  . Ovarian cyst 01/14/2015  . Pelvic pain in female 01/11/2015  . Pregnancy induced hypertension    with pregnancy; no meds now.  . Pregnant 02/05/2016    Past Surgical History:  Procedure Laterality Date  . CESAREAN SECTION N/A 09/29/2014   Procedure: CESAREAN SECTION;  Surgeon: Jonnie Kind, MD;  Location: [facility];  Service: Obstetrics;  Laterality: N/A;  . CESAREAN SECTION N/A 09/25/2016   Procedure: CESAREAN SECTION;  Surgeon: Jonnie Kind, MD;  Location: [facility];  Service: Obstetrics;  Laterality: N/A;  . COLONOSCOPY WITH PROPOFOL N/A 08/02/2017   Dr. Gala Romney: moderate internal hemorhoids, distal 5 cm of TI also appeared normal  . ESOPHAGOGASTRODUODENOSCOPY (EGD) WITH ESOPHAGEAL DILATION N/A 07/19/2013   Dr. Rourk:normal appearing esophagus s/p dilation, normal D1 and D2, unremarkable path   . ESOPHAGOGASTRODUODENOSCOPY (EGD) WITH PROPOFOL N/A 08/02/2017    Normal esophagus, small hiatal hernia, normal duodenum  . none    . WISDOM TOOTH EXTRACTION      There were no vitals filed for this visit.  Subjective Assessment - 02/08/18 1348    Subjective  Pt stated she is feeling better, noticed she has stiffness on kneecap when sleeps in fetal position.  Current pain scale 5/10 achey pain.      Patient Stated Goals  less pain ; waking up from pain 3 x a week; to be able to sit , stand and walk longer and go up steps without pain.    Currently in Pain?  Yes    Pain Score  5     Pain Location  Knee    Pain Orientation  Left    Pain Descriptors / Indicators  Aching    Pain Type  Chronic pain    Pain Onset  More than a month ago    Pain Frequency  Constant    Aggravating Factors   weight bearing    Pain Relieving Factors  nothing    Effect of Pain on Daily Activities  decreased tolerance for working/standing                      [facility] Adult PT Treatment/Exercise - 02/08/18 0001      Knee/Hip Exercises: Standing  Heel Raises  15 reps Toe raises    Forward Lunges  Both;15 reps on floor    Lateral Step Up  Left;15 reps;Hand Hold: 1;Step Height: 4"    Forward Step Up  Left;15 reps;Hand Hold: 0;Step Height: 6"    Functional Squat  15 reps chair tapping with 1 knee cap pop... not painful    Rocker Board  2 minutes lateral and DF/PF    SLS with Vectors  3x 5" on foam minimal HHA      Manual Therapy   Manual Therapy  Taping    Manual therapy comments  Manual complete separate than rest of tx    Kinesiotex  Facilitate Muscle      Kinesiotix   Facilitate Muscle   Kinesiotaping for patella mobility             PT Education - 02/08/18 1400    Education provided  Yes    Education Details  Educated purpose with kinesiotaping to assist with patella stability    Person(s) Educated  Patient    Methods  Explanation    Comprehension  Verbalized understanding       PT Short Term Goals - 01/18/18 1443      PT SHORT TERM  GOAL #1   Title  Pt left knee  pain to decrease to no greater than a 5/10 to allow pt not to wake throughout the night due to pain.     Time  2    Period  Weeks    Status  New    Target Date  02/07/18      PT SHORT TERM GOAL #2   Title  PT to be able to walk for 30 minutes without increased right knee pain to allow pt to complete short shopping trips.     Time  2    Period  Weeks    Status  New      PT SHORT TERM GOAL #3   Title  PT to be able to sit for 30 minutes without increased right knee pain to be able to eat her meals without discomfort.     Time  2    Period  Weeks      PT SHORT TERM GOAL #4   Title  PT Single leg stance on her left leg to be at least 30 seconds to allow her to feel more stable walking on uneven terrain    Time  2    Period  Weeks    Status  New      PT SHORT TERM GOAL #5   Title  PT Left leg strength to be increased 1/2 grade to allow pt to come from sit to stand from lower levels,(ie couch, car) without difficulty     Time  2    Period  Weeks    Status  New        PT Long Term Goals - 01/18/18 1553      PT LONG TERM GOAL #1   Title  Pt pain level in her left knee to be no greater than a 2/10 so she is no longer waking up in the middle of the night.     Time  4    Period  Weeks    Status  New    Target Date  02/21/18      PT LONG TERM GOAL #2   Title  PT single leg stance on her Lt leg to be at least 45  seconds to allow pt to feel comfortable walking on uneven ground.    Time  4    Period  Weeks    Status  New      PT LONG TERM GOAL #3   Title  Leg mm in pt Lt leg to be increased one grade to allow pt to go up and down steps in a reciprocal manner with pain no greater than a 2/10     Time  4    Period  Weeks    Status  New      PT LONG TERM GOAL #4   Title  PT to be able to sit in a car for 2 hour without increased left knee pain for traveling.     Time  4    Period  Weeks    Status  New      PT LONG TERM GOAL #5   Title  Pt to  be able to squat and return from a squatted postiiton to perform work and housework activity.    Time  4    Period  Weeks    Status  New            Plan - 02/08/18 1530    Clinical Impression Statement  This session complete with kinesiotape assistance to improve patella mobility for pain control, pt educated on purpose with verbalized understanding.  Continued session focus with functional strengthening, pt reports improved tolerance with tape assistance and less reports of popping/painful popping.  Added vector stance for hip stability.  EOS pt reports pain reduced to 4/10.      Rehab Potential  Good    PT Frequency  2x / week    PT Duration  4 weeks    PT Treatment/Interventions  ADLs/Self Care Home Management;Functional mobility training;Stair training;Gait training;Therapeutic activities;Therapeutic exercise;Balance training;Neuromuscular re-education;Cognitive remediation;Manual techniques;Patient/family education    PT Next Visit Plan  Continue functional strengthening.  Begin step down training next session.  F/u on benefits with kinesiotaping    PT Home Exercise Plan  quad set, SLR, sidelying abduction, Lt hip extension, heel raises functional squat. 03/05 lunges       Patient will benefit from skilled therapeutic intervention in order to improve the following deficits and impairments:  Abnormal gait, Decreased activity tolerance, Decreased balance, Decreased strength, Difficulty walking, Impaired perceived functional ability, Impaired flexibility, Obesity, Pain  Visit Diagnosis: Chronic pain of left knee  Muscle weakness (generalized)     Problem List Patient Active Problem List   Diagnosis Date Noted  . Nexplanon in place 01/03/2018  . Abdominal pain, chronic, generalized 12/13/2017  . Acute left-sided low back pain with left-sided sciatica 01/09/2017  . Morbid obesity ([place]) 01/09/2017  . Encounter for well woman exam with routine gynecological exam 11/03/2016  .  Muscle spasm 11/03/2016  . S/P cesarean section 03/04/2016  . Abnormal Pap smear of cervix 03/04/2016  . Pyelonephritis, acute 06/13/2015  . 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 Hypokalemia 06/13/2015  . Normocytic anemia 06/13/2015  . Total bilirubin, elevated 06/13/2015  . Hyponatremia 06/13/2015  . Hypomagnesemia 06/13/2015  . Dyspepsia 05/08/2015  . Dysphagia, pharyngoesophageal phase 05/08/2015  . Hematochezia 05/08/2015  . Oliguria 04/23/2015  . History of gestational hypertension 04/23/2015  . Ovarian cyst 01/14/2015  . Pelvic pain in female 01/11/2015  . Nausea and vomiting 01/11/2015  . Endometritis 01/11/2015  . Rectal bleeding 07/18/2013  . GERD (gastroesophageal reflux disease) 07/18/2013  . Esophageal dysphagia 07/18/2013  . Abdominal pain,  epigastric 07/18/2013  . Gastritis 07/03/2013  . Irritable bowel syndrome 07/03/2013  . Depression 07/03/2013  . Asthma 03/27/2013  . FRACTURE, TOE 02/22/2008   Ihor Austin, LPTA; [place]  Aldona Lento 02/08/2018, 3:44 PM  [place], [place], [address] Phone: [phone]   Fax:  [phone]  Name: Melissa Livingston MRN: [id number] Date of Birth: Apr 24, 1996

## 2018-02-08 NOTE — Patient Instructions (Signed)
Toe / Heel Raise (Standing)    Standing with support, raise heels, then rock back on heels and raise toes. Repeat 15-20 times.  Copyright  VHI. All rights reserved.   FUNCTIONAL MOBILITY: Squat    Stance: shoulder-width on floor. Bend hips and knees. Keep back straight. Do not allow knees to bend past toes. Squeeze glutes and quads to stand. 10 reps per set, 2 sets per day.  Copyright  VHI. All rights reserved.   Forward Lunge    Standing with feet shoulder width apart and stomach tight, step forward with left leg. Repeat 10 times per set. Do 1-2 sets per session.   http://orth.exer.us/1147   Copyright  VHI. All rights reserved.

## 2018-02-10 ENCOUNTER — Other Ambulatory Visit: Payer: Self-pay

## 2018-02-10 ENCOUNTER — Encounter (HOSPITAL_COMMUNITY): Payer: Self-pay | Admitting: Physical Therapy

## 2018-02-10 ENCOUNTER — Ambulatory Visit (HOSPITAL_COMMUNITY): Payer: 59 | Admitting: Physical Therapy

## 2018-02-10 DIAGNOSIS — G8929 Other chronic pain: Secondary | ICD-10-CM

## 2018-02-10 DIAGNOSIS — M25562 Pain in left knee: Secondary | ICD-10-CM | POA: Diagnosis not present

## 2018-02-10 DIAGNOSIS — M6281 Muscle weakness (generalized): Secondary | ICD-10-CM

## 2018-02-10 NOTE — Therapy (Signed)
[place], [place], [address] Phone: [phone]   Fax:  [phone]  Physical Therapy Treatment  Patient Details  Name: Melissa Livingston MRN: [id number] Date of Birth: 1996/01/09 Referring Provider: Sanjuana Kava   Encounter Date: 02/10/2018  PT End of Session - 02/10/18 1316    Visit Number  5    Number of Visits  8    Date for PT Re-Evaluation  02/17/18    Authorization Type  secondary medicaid     Authorization - Visit Number  5    Authorization - Number of Visits  8    PT Start Time  6962    PT Stop Time  1345    PT Time Calculation (min)  42 min    Activity Tolerance  Patient tolerated treatment well;No increased pain    Behavior During Therapy  WFL for tasks assessed/performed       Past Medical History:  Diagnosis Date  . Anemia   . Anxiety   . Asthma    as child  . Endometritis 01/11/2015  . GERD (gastroesophageal reflux disease)   . History of ovarian cyst 01/28/2015  . IBS (irritable bowel syndrome)   . Nausea and vomiting 01/11/2015  . Ovarian cyst 01/14/2015  . Pelvic pain in female 01/11/2015  . Pregnancy induced hypertension    with pregnancy; no meds now.  . Pregnant 02/05/2016    Past Surgical History:  Procedure Laterality Date  . CESAREAN SECTION N/A 09/29/2014   Procedure: CESAREAN SECTION;  Surgeon: Jonnie Kind, MD;  Location: [facility];  Service: Obstetrics;  Laterality: N/A;  . CESAREAN SECTION N/A 09/25/2016   Procedure: CESAREAN SECTION;  Surgeon: Jonnie Kind, MD;  Location: [facility];  Service: Obstetrics;  Laterality: N/A;  . COLONOSCOPY WITH PROPOFOL N/A 08/02/2017   Dr. Gala Romney: moderate internal hemorhoids, distal 5 cm of TI also appeared normal  . ESOPHAGOGASTRODUODENOSCOPY (EGD) WITH ESOPHAGEAL DILATION N/A 07/19/2013   Dr. Rourk:normal appearing esophagus s/p dilation, normal D1 and D2, unremarkable path   . ESOPHAGOGASTRODUODENOSCOPY (EGD) WITH PROPOFOL N/A 08/02/2017    Normal esophagus, small hiatal hernia, normal duodenum  . none    . WISDOM TOOTH EXTRACTION      There were no vitals filed for this visit.  Subjective Assessment - 02/10/18 1303    Subjective  Ms. Hirt states that she feels that the taping improved her pain.  She is doing her HEP with no difficulty    Limitations  Sitting;Lifting;Standing;Walking;House hold activities    How long can you sit comfortably?  less than five minutes     How long can you stand comfortably?  five minutes     How long can you walk comfortably?  ten minutes     Patient Stated Goals  less pain ; waking up from pain 3 x a week; to be able to sit , stand and walk longer and go up steps without pain.    Currently in Pain?  Yes    Pain Score  4     Pain Location  Knee    Pain Orientation  Left    Pain Descriptors / Indicators  Aching    Pain Onset  More than a month ago                      [facility] Adult PT Treatment/Exercise - 02/10/18 0001      Ambulation/Gait   Ambulation Distance (  Feet)  --    Assistive device  --    Gait Comments  --      Exercises   Exercises  Knee/Hip      Knee/Hip Exercises: Stretches   Active Hamstring Stretch  2 reps;30 seconds    Other Knee/Hip Stretches  slant board stretch 1x 60"       Knee/Hip Exercises: Standing   Heel Raises  10 reps;Left LT only     Forward Lunges  Both;15 reps on floor    Lateral Step Up  Left;15 reps;Step Height: 4"    Forward Step Up  Left;15 reps;Hand Hold: 0;Step Height: 4"    Step Down  Left;15 reps;Step Height: 2";Hand Hold: 2    Functional Squat  15 reps    Wall Squat  10 reps;5 seconds    Rocker Board  2 minutes lateral and DF/PF    SLS  --    SLS with Vectors  3x10 " on foam B     Other Standing Knee Exercises  side stepping with t band       Knee/Hip Exercises: Seated   Sit to Sand  10 reps;without UE support eccentric control      Knee/Hip Exercises: Supine   Quad Sets  --    Short Arc Quad Sets  --    Straight  Leg Raises  --      Knee/Hip Exercises: Sidelying   Hip ABduction  --      Knee/Hip Exercises: Prone   Hip Extension  --      Manual Therapy   Manual Therapy  Taping    Manual therapy comments  Manual complete separate than rest of tx    Kinesiotex  Facilitate Muscle      Kinesiotix   Facilitate Muscle   Kinesiotaping for patella mobility               PT Short Term Goals - 01/18/18 1443      PT SHORT TERM GOAL #1   Title  Pt left knee  pain to decrease to no greater than a 5/10 to allow pt not to wake throughout the night due to pain.     Time  2    Period  Weeks    Status  New    Target Date  02/07/18      PT SHORT TERM GOAL #2   Title  PT to be able to walk for 30 minutes without increased right knee pain to allow pt to complete short shopping trips.     Time  2    Period  Weeks    Status  New      PT SHORT TERM GOAL #3   Title  PT to be able to sit for 30 minutes without increased right knee pain to be able to eat her meals without discomfort.     Time  2    Period  Weeks      PT SHORT TERM GOAL #4   Title  PT Single leg stance on her left leg to be at least 30 seconds to allow her to feel more stable walking on uneven terrain    Time  2    Period  Weeks    Status  New      PT SHORT TERM GOAL #5   Title  PT Left leg strength to be increased 1/2 grade to allow pt to come from sit to stand from lower levels,(ie couch, car) without difficulty  Time  2    Period  Weeks    Status  New        PT Long Term Goals - 01/18/18 1553      PT LONG TERM GOAL #1   Title  Pt pain level in her left knee to be no greater than a 2/10 so she is no longer waking up in the middle of the night.     Time  4    Period  Weeks    Status  New    Target Date  02/21/18      PT LONG TERM GOAL #2   Title  PT single leg stance on her Lt leg to be at least 45 seconds to allow pt to feel comfortable walking on uneven ground.    Time  4    Period  Weeks    Status  New       PT LONG TERM GOAL #3   Title  Leg mm in pt Lt leg to be increased one grade to allow pt to go up and down steps in a reciprocal manner with pain no greater than a 2/10     Time  4    Period  Weeks    Status  New      PT LONG TERM GOAL #4   Title  PT to be able to sit in a car for 2 hour without increased left knee pain for traveling.     Time  4    Period  Weeks    Status  New      PT LONG TERM GOAL #5   Title  Pt to be able to squat and return from a squatted postiiton to perform work and housework activity.    Time  4    Period  Weeks    Status  New            Plan - 02/10/18 1318    Clinical Impression Statement  PT advanced in time with vector stances, completed single leg heelraises instead of double and added step downs. Focused on prefecting form while completing exercises with good technique after verbal cuing.     Rehab Potential  Good    PT Frequency  2x / week    PT Duration  4 weeks    PT Treatment/Interventions  ADLs/Self Care Home Management;Functional mobility training;Stair training;Gait training;Therapeutic activities;Therapeutic exercise;Balance training;Neuromuscular re-education;Cognitive remediation;Manual techniques;Patient/family education    PT Next Visit Plan  increase depth of lunges     PT Home Exercise Plan  quad set, SLR, sidelying abduction, Lt hip extension, heel raises functional squat. 03/05 lunges       Patient will benefit from skilled therapeutic intervention in order to improve the following deficits and impairments:  Abnormal gait, Decreased activity tolerance, Decreased balance, Decreased strength, Difficulty walking, Impaired perceived functional ability, Impaired flexibility, Obesity, Pain  Visit Diagnosis: Chronic pain of left knee  Muscle weakness (generalized)     Problem List Patient Active Problem List   Diagnosis Date Noted  . Nexplanon in place 01/03/2018  . Abdominal pain, chronic, generalized 12/13/2017  . Acute  left-sided low back pain with left-sided sciatica 01/09/2017  . Morbid obesity ([place]) 01/09/2017  . Encounter for well woman exam with routine gynecological exam 11/03/2016  . Muscle spasm 11/03/2016  . S/P cesarean section 03/04/2016  . Abnormal Pap smear of cervix 03/04/2016  . Pyelonephritis, acute 06/13/2015  . 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 Hypokalemia 06/13/2015  .  Normocytic anemia 06/13/2015  . Total bilirubin, elevated 06/13/2015  . Hyponatremia 06/13/2015  . Hypomagnesemia 06/13/2015  . Dyspepsia 05/08/2015  . Dysphagia, pharyngoesophageal phase 05/08/2015  . Hematochezia 05/08/2015  . Oliguria 04/23/2015  . History of gestational hypertension 04/23/2015  . Ovarian cyst 01/14/2015  . Pelvic pain in female 01/11/2015  . Nausea and vomiting 01/11/2015  . Endometritis 01/11/2015  . Rectal bleeding 07/18/2013  . GERD (gastroesophageal reflux disease) 07/18/2013  . Esophageal dysphagia 07/18/2013  . Abdominal pain, epigastric 07/18/2013  . Gastritis 07/03/2013  . Irritable bowel syndrome 07/03/2013  . Depression 07/03/2013  . Asthma 03/27/2013  . FRACTURE, TOE 02/22/2008    Rayetta Humphrey, PT CLT [phone] 02/10/2018, 1:44 PM  [facility] [address] [place], [place], [address] Phone: [phone]   Fax:  [phone]  Name: Melissa Livingston MRN: [id number] Date of Birth: 11/07/1996

## 2018-02-15 ENCOUNTER — Ambulatory Visit (HOSPITAL_COMMUNITY): Payer: 59 | Admitting: Physical Therapy

## 2018-02-15 ENCOUNTER — Telehealth (HOSPITAL_COMMUNITY): Payer: Self-pay | Admitting: Family Medicine

## 2018-02-15 NOTE — Telephone Encounter (Signed)
02/15/18  pt called and said that she wanted to cancel today's appt... no reason was given

## 2018-02-16 ENCOUNTER — Telehealth: Payer: Self-pay | Admitting: Adult Health

## 2018-02-16 NOTE — Telephone Encounter (Signed)
Patient states she has been bleeding for 2 weeks. She normally spots monthly since she has had her Nexplanon but this has been heavier and longer. Stated that she did have a discharge with a fishy odor right before it started but wants to make an appointment to be assessed. Will get patient in at first available.

## 2018-02-16 NOTE — Telephone Encounter (Signed)
Patient called stating that she has been on her period for two weeks and it has been very heavy. Pt states that she would like a call back from a nurse. Please contact pt

## 2018-02-17 ENCOUNTER — Telehealth (HOSPITAL_COMMUNITY): Payer: Self-pay

## 2018-02-17 ENCOUNTER — Ambulatory Visit (HOSPITAL_COMMUNITY): Payer: 59

## 2018-02-17 NOTE — Telephone Encounter (Signed)
Pt cx she is not feeling well

## 2018-02-22 ENCOUNTER — Ambulatory Visit (HOSPITAL_COMMUNITY): Payer: 59

## 2018-02-22 ENCOUNTER — Telehealth (HOSPITAL_COMMUNITY): Payer: Self-pay

## 2018-02-22 NOTE — Telephone Encounter (Signed)
No show, called and spoke to pt concerning missed apt.  Pt stated she had forgotten about apt today.  reminded next apt date and time, changed time of apt per pt request.    Ihor Austin, [place]; [facility] [phone]

## 2018-02-23 ENCOUNTER — Ambulatory Visit: Payer: 59 | Admitting: Orthopedic Surgery

## 2018-02-23 ENCOUNTER — Ambulatory Visit: Payer: Self-pay | Admitting: Women's Health

## 2018-02-24 ENCOUNTER — Encounter (HOSPITAL_COMMUNITY): Payer: Self-pay

## 2018-02-24 ENCOUNTER — Telehealth (HOSPITAL_COMMUNITY): Payer: Self-pay

## 2018-02-24 NOTE — Telephone Encounter (Signed)
Patient is out of town and wont make it back in time

## 2018-03-01 ENCOUNTER — Ambulatory Visit (HOSPITAL_COMMUNITY): Payer: 59

## 2018-03-01 ENCOUNTER — Telehealth (HOSPITAL_COMMUNITY): Payer: Self-pay

## 2018-03-01 ENCOUNTER — Ambulatory Visit: Payer: 59 | Admitting: General Surgery

## 2018-03-01 ENCOUNTER — Encounter: Payer: Self-pay | Admitting: Orthopedic Surgery

## 2018-03-01 ENCOUNTER — Ambulatory Visit: Payer: 59 | Admitting: Orthopedic Surgery

## 2018-03-01 NOTE — Telephone Encounter (Signed)
No show, called and left message concerning missed apt today.  Included next apt date and time wiht contact info given.  [address] [place]; [facility] [phone]

## 2018-03-03 ENCOUNTER — Telehealth (HOSPITAL_COMMUNITY): Payer: Self-pay | Admitting: Physical Therapy

## 2018-03-03 ENCOUNTER — Ambulatory Visit (HOSPITAL_COMMUNITY): Payer: 59 | Admitting: Physical Therapy

## 2018-03-03 ENCOUNTER — Ambulatory Visit: Payer: 59 | Admitting: General Surgery

## 2018-03-03 ENCOUNTER — Encounter: Payer: Self-pay | Admitting: General Surgery

## 2018-03-03 VITALS — BP 138/75 | HR 88 | Temp 98.0°F | Ht 63.0 in | Wt 206.0 lb

## 2018-03-03 DIAGNOSIS — K811 Chronic cholecystitis: Secondary | ICD-10-CM

## 2018-03-03 NOTE — H&P (Signed)
Melissa Livingston; [id number]; 1996-11-03   HPI Patient is a 22 year old black female who was referred to my care for evaluation and treatment of right upper quadrant abdominal pain.  She was referred by Roseanne Kaufman of GI.  The patient has had intermittent right upper quadrant abdominal pain and bloating for several months.  Ultrasound of gallbladder was negative.  HIDA scan revealed a borderline gallbladder ejection fraction with reproducible symptoms with fatty meal.  An attempt at diet changes was done, but the patient continues to have abdominal pain.  It seems to be worse over the past few weeks.  She denies any fever, chills, fatty food intolerance, or jaundice.  She currently has a pain level of 6 out of 10 when the pain occurs. Past Medical History:  Diagnosis Date  . Anemia   . Anxiety   . Asthma    as child  . Endometritis 01/11/2015  . GERD (gastroesophageal reflux disease)   . History of ovarian cyst 01/28/2015  . IBS (irritable bowel syndrome)   . Nausea and vomiting 01/11/2015  . Ovarian cyst 01/14/2015  . Pelvic pain in female 01/11/2015  . Pregnancy induced hypertension    with pregnancy; no meds now.  . Pregnant 02/05/2016    Past Surgical History:  Procedure Laterality Date  . CESAREAN SECTION N/A 09/29/2014   Procedure: CESAREAN SECTION;  Surgeon: Jonnie Kind, MD;  Location: [facility];  Service: Obstetrics;  Laterality: N/A;  . CESAREAN SECTION N/A 09/25/2016   Procedure: CESAREAN SECTION;  Surgeon: Jonnie Kind, MD;  Location: [facility];  Service: Obstetrics;  Laterality: N/A;  . COLONOSCOPY WITH PROPOFOL N/A 08/02/2017   Dr. Gala Romney: moderate internal hemorhoids, distal 5 cm of TI also appeared normal  . ESOPHAGOGASTRODUODENOSCOPY (EGD) WITH ESOPHAGEAL DILATION N/A 07/19/2013   Dr. Rourk:normal appearing esophagus s/p dilation, normal D1 and D2, unremarkable path   . ESOPHAGOGASTRODUODENOSCOPY (EGD) WITH PROPOFOL N/A 08/02/2017   Normal esophagus, small hiatal hernia,  normal duodenum  . none    . WISDOM TOOTH EXTRACTION      Family History  Problem Relation Age of Onset  . Multiple sclerosis Mother   . Heart disease Maternal Grandfather   . Heart disease Paternal Uncle   . Diabetes Paternal Uncle   . Hypertension Paternal Uncle   . Cancer Other        father's aunt had breast cancer  . Colon cancer Other        maternal great uncle, age greater than 31  . Crohn's disease Other        couple of family members on father's side of family    Current Outpatient Medications on File Prior to Visit  Medication Sig Dispense Refill  . acetaminophen (TYLENOL) 325 MG tablet Take 650 mg by mouth every 6 (six) hours as needed for mild pain, moderate pain or headache.     Marland Kitchen amoxicillin-clavulanate (AUGMENTIN) 875-125 MG tablet Take 1 tablet by mouth 2 (two) times daily. 20 tablet 0  . dexlansoprazole (DEXILANT) 60 MG capsule Take 1 capsule (60 mg total) by mouth daily before breakfast. 30 capsule 3  . dicyclomine (BENTYL) 20 MG tablet Take 1 tablet (20 mg total) by mouth 3 (three) times daily as needed. (Patient taking differently: Take 20 mg by mouth 3 (three) times daily as needed for spasms. ) 30 tablet 1  . etonogestrel (NEXPLANON) 68 MG IMPL implant 1 each by Subdermal route once.    . Iron-FA-B Cmp-C-Biot-Probiotic (FUSION PLUS) CAPS  Take 1 capsule by mouth daily.    Marland Kitchen linaclotide (LINZESS) 290 MCG CAPS capsule Take 1 capsule (290 mcg total) by mouth daily before breakfast. Take 30 minutes 90 capsule 3  . naproxen (NAPROSYN) 500 MG tablet Take 1 tablet (500 mg total) by mouth 2 (two) times daily with a meal. 60 tablet 5   No current facility-administered medications on file prior to visit.     Allergies  Allergen Reactions  . Adhesive [Tape] Other (See Comments)    Reaction:  Blisters   . Lorabid [Loracarbef] Hives, Swelling and Other (See Comments)    Reaction:  Mouth swelling   . Latex Itching and Rash  . Orange Fruit [Citrus] Rash    Social  History   Substance and Sexual Activity  Alcohol Use No  . Alcohol/week: 0.0 oz    Social History   Tobacco Use  Smoking Status Never Smoker  Smokeless Tobacco Never Used    Review of Systems  Constitutional: Negative.   HENT: Negative.   Eyes: Negative.   Respiratory: Negative.   Cardiovascular: Negative.   Gastrointestinal: Positive for abdominal pain, heartburn and nausea.  Genitourinary: Negative.   Musculoskeletal: Positive for joint pain.  Skin: Negative.   Neurological: Negative.   Endo/Heme/Allergies: Negative.   Psychiatric/Behavioral: Negative.     Objective   Vitals:   03/03/18 1135  BP: 138/75  Pulse: 88  Temp: 98 F (36.7 C)    Physical Exam  Constitutional: She is oriented to person, place, and time and well-developed, well-nourished, and in no distress.  HENT:  Head: Normocephalic and atraumatic.  Eyes: No scleral icterus.  Cardiovascular: Normal rate, regular rhythm and normal heart sounds. Exam reveals no gallop and no friction rub.  No murmur heard. Pulmonary/Chest: Effort normal and breath sounds normal. No respiratory distress. She has no wheezes. She has no rales.  Abdominal: Soft. Bowel sounds are normal. She exhibits no distension. There is tenderness. There is no rebound and no guarding.  Tender in the right upper quadrant to palpation.  No rigidity noted.  Neurological: She is alert and oriented to person, place, and time.  Skin: Skin is warm and dry.  Vitals reviewed.  GI notes reviewed.  HIDA scan report reviewed. Assessment   Chronic cholecystitis  Plan   Patient is scheduled for laparoscopic cholecystectomy on 03/07/2018.  The risks and benefits of the procedure including bleeding, infection, hepatobiliary injury, and the possibility of an open procedure were fully explained to the patient, who gave informed consent.

## 2018-03-03 NOTE — Patient Instructions (Signed)

## 2018-03-03 NOTE — Telephone Encounter (Signed)
Therapist called patient and left a message regarding patient not showing up for her third consecutive appointment without calling ahead. Therapist stated that she hoped patient is doing well. Therapist informed patient that per the attendance policy that patient would be discharged from physical therapy at this time. Therapist stated that if patient had any questions she could call the outpatient clinic and provided a telephone number to do so. Call was completed at 2:12 pm.   Clarene Critchley PT, DPT 2:12 PM, 03/03/18 [phone]

## 2018-03-03 NOTE — Patient Instructions (Signed)
[place]  03/03/2018     @PREFPERIOPPHARMACY @   Your procedure is scheduled on  03/07/2018   Report to [facility] at  25   A.M.  Call this number if you have problems the morning of surgery:  [phone]   Remember:  Do not eat food or drink liquids after midnight.  Take these medicines the morning of surgery with A SIP OF WATER Dexilant   Do not wear jewelry, make-up or nail polish.  Do not wear lotions, powders, or perfumes, or deodorant.  Do not shave 48 hours prior to surgery.  Men may shave face and neck.  Do not bring valuables to the hospital.  [facility] is not responsible for any belongings or valuables.  Contacts, dentures or bridgework may not be worn into surgery.  Leave your suitcase in the car.  After surgery it may be brought to your room.  For patients admitted to the hospital, discharge time will be determined by your treatment team.  Patients discharged the day of surgery will not be allowed to drive home.   Name and phone number of your driver:   family Special instructions:  None  Please read over the following fact sheets that you were given. Anesthesia Post-op Instructions and Care and Recovery After Surgery       Laparoscopic Cholecystectomy, Care After This sheet gives you information about how to care for yourself after your procedure. Your health care provider may also give you more specific instructions. If you have problems or questions, contact your health care provider. What can I expect after the procedure? After the procedure, it is common to have:  Pain at your incision sites. You will be given medicines to control this pain.  Mild nausea or vomiting.  Bloating and possible shoulder pain from the air-like gas that was used during the procedure.  Follow these instructions at home: Incision care   Follow instructions from your health care provider about how to take care of your incisions. Make sure you: ? Wash  your hands with soap and water before you change your bandage (dressing). If soap and water are not available, use hand sanitizer. ? Change your dressing as told by your health care provider. ? Leave stitches (sutures), skin glue, or adhesive strips in place. These skin closures may need to be in place for 2 weeks or longer. If adhesive strip edges start to loosen and curl up, you may trim the loose edges. Do not remove adhesive strips completely unless your health care provider tells you to do that.  Do not take baths, swim, or use a hot tub until your health care provider approves. Ask your health care provider if you can take showers. You may only be allowed to take sponge baths for bathing.  Check your incision area every day for signs of infection. Check for: ? More redness, swelling, or pain. ? More fluid or blood. ? Warmth. ? Pus or a bad smell. Activity  Do not drive or use heavy machinery while taking prescription pain medicine.  Do not lift anything that is heavier than 10 lb (4.5 kg) until your health care provider approves.  Do not play contact sports until your health care provider approves.  Do not drive for 24 hours if you were given a medicine to help you relax (sedative).  Rest as needed. Do not return to work or school until your health care provider approves.  General instructions  Take over-the-counter and prescription medicines only as told by your health care provider.  To prevent or treat constipation while you are taking prescription pain medicine, your health care provider may recommend that you: ? Drink enough fluid to keep your urine clear or pale yellow. ? Take over-the-counter or prescription medicines. ? Eat foods that are high in fiber, such as fresh fruits and vegetables, whole grains, and beans. ? Limit foods that are high in fat and processed sugars, such as fried and sweet foods. Contact a health care provider if:  You develop a rash.  You have  more redness, swelling, or pain around your incisions.  You have more fluid or blood coming from your incisions.  Your incisions feel warm to the touch.  You have pus or a bad smell coming from your incisions.  You have a fever.  One or more of your incisions breaks open. Get help right away if:  You have trouble breathing.  You have chest pain.  You have increasing pain in your shoulders.  You faint or feel dizzy when you stand.  You have severe pain in your abdomen.  You have nausea or vomiting that lasts for more than one day.  You have leg pain. This information is not intended to replace advice given to you by your health care provider. Make sure you discuss any questions you have with your health care provider. Document Released: 11/23/2005 Document Revised: 06/13/2016 Document Reviewed: 05/11/2016 Elsevier Interactive Patient Education  2018 [employer].  Laparoscopic Cholecystectomy Laparoscopic cholecystectomy is surgery to remove the gallbladder. The gallbladder is a pear-shaped organ that lies beneath the liver on the right side of the body. The gallbladder stores bile, which is a fluid that helps the body to digest fats. Cholecystectomy is often done for inflammation of the gallbladder (cholecystitis). This condition is usually caused by a buildup of gallstones (cholelithiasis) in the gallbladder. Gallstones can block the flow of bile, which can result in inflammation and pain. In severe cases, emergency surgery may be required. This procedure is done though small incisions in your abdomen (laparoscopic surgery). A thin scope with a camera (laparoscope) is inserted through one incision. Thin surgical instruments are inserted through the other incisions. In some cases, a laparoscopic procedure may be turned into a type of surgery that is done through a larger incision (open surgery). Tell a health care provider about:  Any allergies you have.  All medicines you are  taking, including vitamins, herbs, eye drops, creams, and over-the-counter medicines.  Any problems you or family members have had with anesthetic medicines.  Any blood disorders you have.  Any surgeries you have had.  Any medical conditions you have.  Whether you are pregnant or may be pregnant. What are the risks? Generally, this is a safe procedure. However, problems may occur, including:  Infection.  Bleeding.  Allergic reactions to medicines.  Damage to other structures or organs.  A stone remaining in the common bile duct. The common bile duct carries bile from the gallbladder into the small intestine.  A bile leak from the cyst duct that is clipped when your gallbladder is removed.  What happens before the procedure? Staying hydrated Follow instructions from your health care provider about hydration, which may include:  Up to 2 hours before the procedure - you may continue to drink clear liquids, such as water, clear fruit juice, black coffee, and plain tea.  Eating and drinking restrictions Follow instructions from your health  care provider about eating and drinking, which may include:  8 hours before the procedure - stop eating heavy meals or foods such as meat, fried foods, or fatty foods.  6 hours before the procedure - stop eating light meals or foods, such as toast or cereal.  6 hours before the procedure - stop drinking milk or drinks that contain milk.  2 hours before the procedure - stop drinking clear liquids.  Medicines  Ask your health care provider about: ? Changing or stopping your regular medicines. This is especially important if you are taking diabetes medicines or blood thinners. ? Taking medicines such as aspirin and ibuprofen. These medicines can thin your blood. Do not take these medicines before your procedure if your health care provider instructs you not to.  You may be given antibiotic medicine to help prevent infection. General  instructions  Let your health care provider know if you develop a cold or an infection before surgery.  Plan to have someone take you home from the hospital or clinic.  Ask your health care provider how your surgical site will be marked or identified. What happens during the procedure?  To reduce your risk of infection: ? Your health care team will wash or sanitize their hands. ? Your skin will be washed with soap. ? Hair may be removed from the surgical area.  An IV tube may be inserted into one of your veins.  You will be given one or more of the following: ? A medicine to help you relax (sedative). ? A medicine to make you fall asleep (general anesthetic).  A breathing tube will be placed in your mouth.  Your surgeon will make several small cuts (incisions) in your abdomen.  The laparoscope will be inserted through one of the small incisions. The camera on the laparoscope will send images to a TV screen (monitor) in the operating room. This lets your surgeon see inside your abdomen.  Air-like gas will be pumped into your abdomen. This will expand your abdomen to give the surgeon more room to perform the surgery.  Other tools that are needed for the procedure will be inserted through the other incisions. The gallbladder will be removed through one of the incisions.  Your common bile duct may be examined. If stones are found in the common bile duct, they may be removed.  After your gallbladder has been removed, the incisions will be closed with stitches (sutures), staples, or skin glue.  Your incisions may be covered with a bandage (dressing). The procedure may vary among health care providers and hospitals. What happens after the procedure?  Your blood pressure, heart rate, breathing rate, and blood oxygen level will be monitored until the medicines you were given have worn off.  You will be given medicines as needed to control your pain.  Do not drive for 24 hours if you  were given a sedative. This information is not intended to replace advice given to you by your health care provider. Make sure you discuss any questions you have with your health care provider. Document Released: 11/23/2005 Document Revised: 06/14/2016 Document Reviewed: 05/11/2016 Elsevier Interactive Patient Education  2018 [place] Anesthesia, Adult General anesthesia is the use of medicines to make a person "go to sleep" (be unconscious) for a medical procedure. General anesthesia is often recommended when a procedure:  Is long.  Requires you to be still or in an unusual position.  Is major and can cause you to lose blood.  Is impossible to do without general anesthesia.  The medicines used for general anesthesia are called general anesthetics. In addition to making you sleep, the medicines:  Prevent pain.  Control your blood pressure.  Relax your muscles.  Tell a health care provider about:  Any allergies you have.  All medicines you are taking, including vitamins, herbs, eye drops, creams, and over-the-counter medicines.  Any problems you or family members have had with anesthetic medicines.  Types of anesthetics you have had in the past.  Any bleeding disorders you have.  Any surgeries you have had.  Any medical conditions you have.  Any history of heart or lung conditions, such as heart failure, sleep apnea, or chronic obstructive pulmonary disease (COPD).  Whether you are pregnant or may be pregnant.  Whether you use tobacco, alcohol, marijuana, or street drugs.  Any history of Armed forces logistics/support/administrative officer.  Any history of depression or anxiety. What are the risks? Generally, this is a safe procedure. However, problems may occur, including:  Allergic reaction to anesthetics.  Lung and heart problems.  Inhaling food or liquids from your stomach into your lungs (aspiration).  Injury to nerves.  Waking up during your procedure and being unable to  move (rare).  Extreme agitation or a state of mental confusion (delirium) when you wake up from the anesthetic.  Air in the bloodstream, which can lead to stroke.  These problems are more likely to develop if you are having a major surgery or if you have an advanced medical condition. You can prevent some of these complications by answering all of your health care provider's questions thoroughly and by following all pre-procedure instructions. General anesthesia can cause side effects, including:  Nausea or vomiting  A sore throat from the breathing tube.  Feeling cold or shivery.  Feeling tired, washed out, or achy.  Sleepiness or drowsiness.  Confusion or agitation.  What happens before the procedure? Staying hydrated Follow instructions from your health care provider about hydration, which may include:  Up to 2 hours before the procedure - you may continue to drink clear liquids, such as water, clear fruit juice, black coffee, and plain tea.  Eating and drinking restrictions Follow instructions from your health care provider about eating and drinking, which may include:  8 hours before the procedure - stop eating heavy meals or foods such as meat, fried foods, or fatty foods.  6 hours before the procedure - stop eating light meals or foods, such as toast or cereal.  6 hours before the procedure - stop drinking milk or drinks that contain milk.  2 hours before the procedure - stop drinking clear liquids.  Medicines  Ask your health care provider about: ? Changing or stopping your regular medicines. This is especially important if you are taking diabetes medicines or blood thinners. ? Taking medicines such as aspirin and ibuprofen. These medicines can thin your blood. Do not take these medicines before your procedure if your health care provider instructs you not to. ? Taking new dietary supplements or medicines. Do not take these during the week before your procedure  unless your health care provider approves them.  If you are told to take a medicine or to continue taking a medicine on the day of the procedure, take the medicine with sips of water. General instructions   Ask if you will be going home the same day, the following day, or after a longer hospital stay. ? Plan to have someone take you home. ? Plan  to have someone stay with you for the first 24 hours after you leave the hospital or clinic.  For 3-6 weeks before the procedure, try not to use any tobacco products, such as cigarettes, chewing tobacco, and e-cigarettes.  You may brush your teeth on the morning of the procedure, but make sure to spit out the toothpaste. What happens during the procedure?  You will be given anesthetics through a mask and through an IV tube in one of your veins.  You may receive medicine to help you relax (sedative).  As soon as you are asleep, a breathing tube may be used to help you breathe.  An anesthesia specialist will stay with you throughout the procedure. He or she will help keep you comfortable and safe by continuing to give you medicines and adjusting the amount of medicine that you get. He or she will also watch your blood pressure, pulse, and oxygen levels to make sure that the anesthetics do not cause any problems.  If a breathing tube was used to help you breathe, it will be removed before you wake up. The procedure may vary among health care providers and hospitals. What happens after the procedure?  You will wake up, often slowly, after the procedure is complete, usually in a recovery area.  Your blood pressure, heart rate, breathing rate, and blood oxygen level will be monitored until the medicines you were given have worn off.  You may be given medicine to help you calm down if you feel anxious or agitated.  If you will be going home the same day, your health care provider may check to make sure you can stand, drink, and urinate.  Your  health care providers will treat your pain and side effects before you go home.  Do not drive for 24 hours if you received a sedative.  You may: ? Feel nauseous and vomit. ? Have a sore throat. ? Have mental slowness. ? Feel cold or shivery. ? Feel sleepy. ? Feel tired. ? Feel sore or achy, even in parts of your body where you did not have surgery. This information is not intended to replace advice given to you by your health care provider. Make sure you discuss any questions you have with your health care provider. Document Released: 03/01/2008 Document Revised: 05/05/2016 Document Reviewed: 11/07/2015 Elsevier Interactive Patient Education  2018 [place] Anesthesia, Adult, Care After These instructions provide you with information about caring for yourself after your procedure. Your health care provider may also give you more specific instructions. Your treatment has been planned according to current medical practices, but problems sometimes occur. Call your health care provider if you have any problems or questions after your procedure. What can I expect after the procedure? After the procedure, it is common to have:  Vomiting.  A sore throat.  Mental slowness.  It is common to feel:  Nauseous.  Cold or shivery.  Sleepy.  Tired.  Sore or achy, even in parts of your body where you did not have surgery.  Follow these instructions at home: For at least 24 hours after the procedure:  Do not: ? Participate in activities where you could fall or become injured. ? Drive. ? Use heavy machinery. ? Drink alcohol. ? Take sleeping pills or medicines that cause drowsiness. ? Make important decisions or sign legal documents. ? Take care of children on your own.  Rest. Eating and drinking  If you vomit, drink water, juice, or soup when you can  drink without vomiting.  Drink enough fluid to keep your urine clear or pale yellow.  Make sure you have little or no  nausea before eating solid foods.  Follow the diet recommended by your health care provider. General instructions  Have a responsible adult stay with you until you are awake and alert.  Return to your normal activities as told by your health care provider. Ask your health care provider what activities are safe for you.  Take over-the-counter and prescription medicines only as told by your health care provider.  If you smoke, do not smoke without supervision.  Keep all follow-up visits as told by your health care provider. This is important. Contact a health care provider if:  You continue to have nausea or vomiting at home, and medicines are not helpful.  You cannot drink fluids or start eating again.  You cannot urinate after 8-12 hours.  You develop a skin rash.  You have fever.  You have increasing redness at the site of your procedure. Get help right away if:  You have difficulty breathing.  You have chest pain.  You have unexpected bleeding.  You feel that you are having a life-threatening or urgent problem. This information is not intended to replace advice given to you by your health care provider. Make sure you discuss any questions you have with your health care provider. Document Released: 03/01/2001 Document Revised: 04/27/2016 Document Reviewed: 11/07/2015 Elsevier Interactive Patient Education  [employer].

## 2018-03-03 NOTE — Progress Notes (Signed)
GUSTAVIA CARIE; [id number]; 1996-11-23   HPI Patient is a 22 year old black female who was referred to my care for evaluation and treatment of right upper quadrant abdominal pain.  She was referred by Roseanne Kaufman of GI.  The patient has had intermittent right upper quadrant abdominal pain and bloating for several months.  Ultrasound of gallbladder was negative.  HIDA scan revealed a borderline gallbladder ejection fraction with reproducible symptoms with fatty meal.  An attempt at diet changes was done, but the patient continues to have abdominal pain.  It seems to be worse over the past few weeks.  She denies any fever, chills, fatty food intolerance, or jaundice.  She currently has a pain level of 6 out of 10 when the pain occurs. Past Medical History:  Diagnosis Date  . Anemia   . Anxiety   . Asthma    as child  . Endometritis 01/11/2015  . GERD (gastroesophageal reflux disease)   . History of ovarian cyst 01/28/2015  . IBS (irritable bowel syndrome)   . Nausea and vomiting 01/11/2015  . Ovarian cyst 01/14/2015  . Pelvic pain in female 01/11/2015  . Pregnancy induced hypertension    with pregnancy; no meds now.  . Pregnant 02/05/2016    Past Surgical History:  Procedure Laterality Date  . CESAREAN SECTION N/A 09/29/2014   Procedure: CESAREAN SECTION;  Surgeon: Jonnie Kind, MD;  Location: [facility];  Service: Obstetrics;  Laterality: N/A;  . CESAREAN SECTION N/A 09/25/2016   Procedure: CESAREAN SECTION;  Surgeon: Jonnie Kind, MD;  Location: [facility];  Service: Obstetrics;  Laterality: N/A;  . COLONOSCOPY WITH PROPOFOL N/A 08/02/2017   Dr. Gala Romney: moderate internal hemorhoids, distal 5 cm of TI also appeared normal  . ESOPHAGOGASTRODUODENOSCOPY (EGD) WITH ESOPHAGEAL DILATION N/A 07/19/2013   Dr. Rourk:normal appearing esophagus s/p dilation, normal D1 and D2, unremarkable path   . ESOPHAGOGASTRODUODENOSCOPY (EGD) WITH PROPOFOL N/A 08/02/2017   Normal esophagus, small hiatal hernia,  normal duodenum  . none    . WISDOM TOOTH EXTRACTION      Family History  Problem Relation Age of Onset  . Multiple sclerosis Mother   . Heart disease Maternal Grandfather   . Heart disease Paternal Uncle   . Diabetes Paternal Uncle   . Hypertension Paternal Uncle   . Cancer Other        father's aunt had breast cancer  . Colon cancer Other        maternal great uncle, age greater than 76  . Crohn's disease Other        couple of family members on father's side of family    Current Outpatient Medications on File Prior to Visit  Medication Sig Dispense Refill  . acetaminophen (TYLENOL) 325 MG tablet Take 650 mg by mouth every 6 (six) hours as needed for mild pain, moderate pain or headache.     Marland Kitchen amoxicillin-clavulanate (AUGMENTIN) 875-125 MG tablet Take 1 tablet by mouth 2 (two) times daily. 20 tablet 0  . dexlansoprazole (DEXILANT) 60 MG capsule Take 1 capsule (60 mg total) by mouth daily before breakfast. 30 capsule 3  . dicyclomine (BENTYL) 20 MG tablet Take 1 tablet (20 mg total) by mouth 3 (three) times daily as needed. (Patient taking differently: Take 20 mg by mouth 3 (three) times daily as needed for spasms. ) 30 tablet 1  . etonogestrel (NEXPLANON) 68 MG IMPL implant 1 each by Subdermal route once.    . Iron-FA-B Cmp-C-Biot-Probiotic (FUSION PLUS) CAPS  Take 1 capsule by mouth daily.    Marland Kitchen linaclotide (LINZESS) 290 MCG CAPS capsule Take 1 capsule (290 mcg total) by mouth daily before breakfast. Take 30 minutes 90 capsule 3  . naproxen (NAPROSYN) 500 MG tablet Take 1 tablet (500 mg total) by mouth 2 (two) times daily with a meal. 60 tablet 5   No current facility-administered medications on file prior to visit.     Allergies  Allergen Reactions  . Adhesive [Tape] Other (See Comments)    Reaction:  Blisters   . Lorabid [Loracarbef] Hives, Swelling and Other (See Comments)    Reaction:  Mouth swelling   . Latex Itching and Rash  . Orange Fruit [Citrus] Rash    Social  History   Substance and Sexual Activity  Alcohol Use No  . Alcohol/week: 0.0 oz    Social History   Tobacco Use  Smoking Status Never Smoker  Smokeless Tobacco Never Used    Review of Systems  Constitutional: Negative.   HENT: Negative.   Eyes: Negative.   Respiratory: Negative.   Cardiovascular: Negative.   Gastrointestinal: Positive for abdominal pain, heartburn and nausea.  Genitourinary: Negative.   Musculoskeletal: Positive for joint pain.  Skin: Negative.   Neurological: Negative.   Endo/Heme/Allergies: Negative.   Psychiatric/Behavioral: Negative.     Objective   Vitals:   03/03/18 1135  BP: 138/75  Pulse: 88  Temp: 98 F (36.7 C)    Physical Exam  Constitutional: She is oriented to person, place, and time and well-developed, well-nourished, and in no distress.  HENT:  Head: Normocephalic and atraumatic.  Eyes: No scleral icterus.  Cardiovascular: Normal rate, regular rhythm and normal heart sounds. Exam reveals no gallop and no friction rub.  No murmur heard. Pulmonary/Chest: Effort normal and breath sounds normal. No respiratory distress. She has no wheezes. She has no rales.  Abdominal: Soft. Bowel sounds are normal. She exhibits no distension. There is tenderness. There is no rebound and no guarding.  Tender in the right upper quadrant to palpation.  No rigidity noted.  Neurological: She is alert and oriented to person, place, and time.  Skin: Skin is warm and dry.  Vitals reviewed.  GI notes reviewed.  HIDA scan report reviewed. Assessment   Chronic cholecystitis  Plan   Patient is scheduled for laparoscopic cholecystectomy on 03/07/2018.  The risks and benefits of the procedure including bleeding, infection, hepatobiliary injury, and the possibility of an open procedure were fully explained to the patient, who gave informed consent.

## 2018-03-04 ENCOUNTER — Other Ambulatory Visit: Payer: Self-pay

## 2018-03-04 ENCOUNTER — Encounter (HOSPITAL_COMMUNITY)
Admission: RE | Admit: 2018-03-04 | Discharge: 2018-03-04 | Disposition: A | Discharge status: Home / Self Care | Payer: 59 | Source: Ambulatory Visit | Attending: General Surgery | Admitting: General Surgery

## 2018-03-04 ENCOUNTER — Encounter (HOSPITAL_COMMUNITY): Payer: Self-pay

## 2018-03-04 DIAGNOSIS — Z79899 Other long term (current) drug therapy: Secondary | ICD-10-CM | POA: Diagnosis not present

## 2018-03-04 DIAGNOSIS — I10 Essential (primary) hypertension: Secondary | ICD-10-CM | POA: Diagnosis not present

## 2018-03-04 DIAGNOSIS — K589 Irritable bowel syndrome without diarrhea: Secondary | ICD-10-CM | POA: Diagnosis not present

## 2018-03-04 DIAGNOSIS — Z793 Long term (current) use of hormonal contraceptives: Secondary | ICD-10-CM | POA: Diagnosis not present

## 2018-03-04 DIAGNOSIS — Z791 Long term (current) use of non-steroidal anti-inflammatories (NSAID): Secondary | ICD-10-CM | POA: Diagnosis not present

## 2018-03-04 DIAGNOSIS — K811 Chronic cholecystitis: Secondary | ICD-10-CM | POA: Diagnosis not present

## 2018-03-04 DIAGNOSIS — K219 Gastro-esophageal reflux disease without esophagitis: Secondary | ICD-10-CM | POA: Diagnosis not present

## 2018-03-04 LAB — COMPREHENSIVE METABOLIC PANEL
ALK PHOS: 92 U/L (ref 38–126)
ALT: 14 U/L (ref 14–54)
ANION GAP: 10 (ref 5–15)
AST: 15 U/L (ref 15–41)
Albumin: 3.7 g/dL (ref 3.5–5.0)
BUN: 5 mg/dL — ABNORMAL LOW (ref 6–20)
CALCIUM: 8.7 mg/dL — AB (ref 8.9–10.3)
CO2: 22 mmol/L (ref 22–32)
CREATININE: 0.77 mg/dL (ref 0.44–1.00)
Chloride: 103 mmol/L (ref 101–111)
Glucose, Bld: 74 mg/dL (ref 65–99)
Potassium: 3.7 mmol/L (ref 3.5–5.1)
Sodium: 135 mmol/L (ref 135–145)
TOTAL PROTEIN: 7.4 g/dL (ref 6.5–8.1)
Total Bilirubin: 0.6 mg/dL (ref 0.3–1.2)

## 2018-03-04 LAB — CBC WITH DIFFERENTIAL/PLATELET
Basophils Absolute: 0 10*3/uL (ref 0.0–0.1)
Basophils Relative: 0 %
EOS ABS: 0.1 10*3/uL (ref 0.0–0.7)
EOS PCT: 2 %
HCT: 38 % (ref 36.0–46.0)
HEMOGLOBIN: 11.7 g/dL — AB (ref 12.0–15.0)
LYMPHS ABS: 1.9 10*3/uL (ref 0.7–4.0)
LYMPHS PCT: 23 %
MCH: 23.5 pg — AB (ref 26.0–34.0)
MCHC: 30.8 g/dL (ref 30.0–36.0)
MCV: 76.3 fL — AB (ref 78.0–100.0)
MONOS PCT: 8 %
Monocytes Absolute: 0.7 10*3/uL (ref 0.1–1.0)
Neutro Abs: 5.4 10*3/uL (ref 1.7–7.7)
Neutrophils Relative %: 67 %
PLATELETS: 352 10*3/uL (ref 150–400)
RBC: 4.98 MIL/uL (ref 3.87–5.11)
RDW: 16.1 % — ABNORMAL HIGH (ref 11.5–15.5)
WBC: 8.1 10*3/uL (ref 4.0–10.5)

## 2018-03-04 LAB — HCG, SERUM, QUALITATIVE: Preg, Serum: NEGATIVE

## 2018-03-07 ENCOUNTER — Ambulatory Visit (HOSPITAL_COMMUNITY): Payer: 59 | Admitting: Anesthesiology

## 2018-03-07 ENCOUNTER — Encounter (HOSPITAL_COMMUNITY)
Admission: RE | Disposition: A | Discharge status: Home / Self Care | Payer: Self-pay | Source: Ambulatory Visit | Attending: General Surgery

## 2018-03-07 ENCOUNTER — Encounter (HOSPITAL_COMMUNITY): Payer: Self-pay | Admitting: *Deleted

## 2018-03-07 ENCOUNTER — Ambulatory Visit (HOSPITAL_COMMUNITY)
Admission: RE | Admit: 2018-03-07 | Discharge: 2018-03-07 | Disposition: A | Discharge status: Home / Self Care | Payer: 59 | Source: Ambulatory Visit | Attending: General Surgery | Admitting: General Surgery

## 2018-03-07 DIAGNOSIS — K219 Gastro-esophageal reflux disease without esophagitis: Secondary | ICD-10-CM | POA: Diagnosis not present

## 2018-03-07 DIAGNOSIS — Z793 Long term (current) use of hormonal contraceptives: Secondary | ICD-10-CM | POA: Insufficient documentation

## 2018-03-07 DIAGNOSIS — K811 Chronic cholecystitis: Secondary | ICD-10-CM

## 2018-03-07 DIAGNOSIS — Z791 Long term (current) use of non-steroidal anti-inflammatories (NSAID): Secondary | ICD-10-CM | POA: Insufficient documentation

## 2018-03-07 DIAGNOSIS — Z79899 Other long term (current) drug therapy: Secondary | ICD-10-CM | POA: Insufficient documentation

## 2018-03-07 DIAGNOSIS — I10 Essential (primary) hypertension: Secondary | ICD-10-CM | POA: Insufficient documentation

## 2018-03-07 DIAGNOSIS — K589 Irritable bowel syndrome without diarrhea: Secondary | ICD-10-CM | POA: Insufficient documentation

## 2018-03-07 HISTORY — PX: CHOLECYSTECTOMY: SHX55

## 2018-03-07 SURGERY — LAPAROSCOPIC CHOLECYSTECTOMY
Anesthesia: General

## 2018-03-07 MED ORDER — SODIUM CHLORIDE 0.9 % IR SOLN
Status: DC | PRN
  Administered 2018-03-07: 1000 mL

## 2018-03-07 MED ORDER — LIDOCAINE HCL 1 % IJ SOLN
INTRAMUSCULAR | Status: DC | PRN
  Administered 2018-03-07: 25 mg via INTRADERMAL

## 2018-03-07 MED ORDER — CHLORHEXIDINE GLUCONATE CLOTH 2 % EX PADS: 6.0000 | MEDICATED_PAD | Freq: Once | CUTANEOUS | Status: DC

## 2018-03-07 MED ORDER — HYDROCODONE-ACETAMINOPHEN 7.5-325 MG PO TABS
1.0000 | ORAL_TABLET | Freq: Once | ORAL | Status: AC | PRN
  Administered 2018-03-07: 1 via ORAL
  Filled 2018-03-07: qty 1

## 2018-03-07 MED ORDER — KETOROLAC TROMETHAMINE 30 MG/ML IJ SOLN
INTRAMUSCULAR | Status: AC
  Filled 2018-03-07: qty 1

## 2018-03-07 MED ORDER — MIDAZOLAM HCL 2 MG/2ML IJ SOLN
INTRAMUSCULAR | Status: AC
Start: 2018-03-07 — End: 2018-03-07
  Filled 2018-03-07: qty 2

## 2018-03-07 MED ORDER — PROPOFOL 10 MG/ML IV BOLUS
INTRAVENOUS | Status: AC
  Filled 2018-03-07: qty 20

## 2018-03-07 MED ORDER — HYDROCODONE-ACETAMINOPHEN 5-325 MG PO TABS: 1.0000 | ORAL_TABLET | ORAL | 0 refills | Status: DC | PRN

## 2018-03-07 MED ORDER — HEMOSTATIC AGENTS (NO CHARGE) OPTIME
TOPICAL | Status: DC | PRN
  Administered 2018-03-07: 1 via TOPICAL

## 2018-03-07 MED ORDER — KETOROLAC TROMETHAMINE 30 MG/ML IJ SOLN
30.0000 mg | Freq: Once | INTRAMUSCULAR | Status: AC
  Administered 2018-03-07: 30 mg via INTRAVENOUS

## 2018-03-07 MED ORDER — MIDAZOLAM HCL 5 MG/5ML IJ SOLN
INTRAMUSCULAR | Status: DC | PRN
  Administered 2018-03-07: 2 mg via INTRAVENOUS

## 2018-03-07 MED ORDER — PROPOFOL 10 MG/ML IV BOLUS
INTRAVENOUS | Status: DC | PRN
  Administered 2018-03-07: 150 mg via INTRAVENOUS

## 2018-03-07 MED ORDER — CIPROFLOXACIN IN D5W 400 MG/200ML IV SOLN
400.0000 mg | INTRAVENOUS | Status: AC
  Administered 2018-03-07 (×2): 400 mg via INTRAVENOUS
  Filled 2018-03-07: qty 200

## 2018-03-07 MED ORDER — HYDROMORPHONE HCL 1 MG/ML IJ SOLN
0.2500 mg | INTRAMUSCULAR | Status: DC | PRN
  Administered 2018-03-07 (×3): 0.5 mg via INTRAVENOUS
  Filled 2018-03-07 (×2): qty 0.5

## 2018-03-07 MED ORDER — SUGAMMADEX SODIUM 500 MG/5ML IV SOLN
INTRAVENOUS | Status: DC | PRN
  Administered 2018-03-07: 300 mg via INTRAVENOUS

## 2018-03-07 MED ORDER — FENTANYL CITRATE (PF) 250 MCG/5ML IJ SOLN
INTRAMUSCULAR | Status: AC
  Filled 2018-03-07: qty 5

## 2018-03-07 MED ORDER — KETAMINE HCL 10 MG/ML IJ SOLN
INTRAMUSCULAR | Status: AC
  Filled 2018-03-07: qty 1

## 2018-03-07 MED ORDER — ONDANSETRON HCL 4 MG/2ML IJ SOLN
4.0000 mg | Freq: Once | INTRAMUSCULAR | Status: AC | PRN
  Administered 2018-03-07: 4 mg via INTRAVENOUS
  Filled 2018-03-07: qty 2

## 2018-03-07 MED ORDER — BUPIVACAINE HCL (PF) 0.5 % IJ SOLN
INTRAMUSCULAR | Status: AC
  Filled 2018-03-07: qty 30

## 2018-03-07 MED ORDER — BUPIVACAINE HCL (PF) 0.5 % IJ SOLN
INTRAMUSCULAR | Status: DC | PRN
  Administered 2018-03-07: 10 mL

## 2018-03-07 MED ORDER — ROCURONIUM BROMIDE 100 MG/10ML IV SOLN
INTRAVENOUS | Status: DC | PRN
  Administered 2018-03-07: 20 mg via INTRAVENOUS

## 2018-03-07 MED ORDER — SUGAMMADEX SODIUM 500 MG/5ML IV SOLN
INTRAVENOUS | Status: AC
  Filled 2018-03-07: qty 5

## 2018-03-07 MED ORDER — SUCCINYLCHOLINE CHLORIDE 20 MG/ML IJ SOLN
INTRAMUSCULAR | Status: DC | PRN
  Administered 2018-03-07: 150 mg via INTRAVENOUS

## 2018-03-07 MED ORDER — LACTATED RINGERS IV SOLN
INTRAVENOUS | Status: DC
  Administered 2018-03-07: 10:00:00 via INTRAVENOUS

## 2018-03-07 MED ORDER — FENTANYL CITRATE (PF) 100 MCG/2ML IJ SOLN
INTRAMUSCULAR | Status: DC | PRN
  Administered 2018-03-07 (×5): 50 ug via INTRAVENOUS

## 2018-03-07 SURGICAL SUPPLY — 49 items
ADH SKN CLS APL DERMABOND .7 (GAUZE/BANDAGES/DRESSINGS) ×1
APPLIER CLIP ROT 10 11.4 M/L (STAPLE) ×2
APR CLP MED LRG 11.4X10 (STAPLE) ×1
BAG HAMPER (MISCELLANEOUS) ×2 IMPLANT
BAG RETRIEVAL 10 (BASKET) ×1
CHLORAPREP W/TINT 26ML (MISCELLANEOUS) ×2 IMPLANT
CLIP APPLIE ROT 10 11.4 M/L (STAPLE) ×1 IMPLANT
CLOTH BEACON ORANGE TIMEOUT ST (SAFETY) ×2 IMPLANT
COVER LIGHT HANDLE STERIS (MISCELLANEOUS) ×4 IMPLANT
DECANTER SPIKE VIAL GLASS SM (MISCELLANEOUS) ×2 IMPLANT
DERMABOND ADVANCED (GAUZE/BANDAGES/DRESSINGS) ×1
DERMABOND ADVANCED .7 DNX12 (GAUZE/BANDAGES/DRESSINGS) IMPLANT
ELECT REM PT RETURN 9FT ADLT (ELECTROSURGICAL) ×2
ELECTRODE REM PT RTRN 9FT ADLT (ELECTROSURGICAL) ×1 IMPLANT
FILTER SMOKE EVAC LAPAROSHD (FILTER) ×2 IMPLANT
GLOVE BIOGEL PI IND STRL 6.5 (GLOVE) IMPLANT
GLOVE BIOGEL PI IND STRL 7.0 (GLOVE) ×1 IMPLANT
GLOVE BIOGEL PI INDICATOR 6.5 (GLOVE) ×1
GLOVE BIOGEL PI INDICATOR 7.0 (GLOVE) ×2
GLOVE SURG SS PI 6.5 STRL IVOR (GLOVE) ×1 IMPLANT
GLOVE SURG SS PI 7.0 STRL IVOR (GLOVE) ×1 IMPLANT
GLOVE SURG SS PI 7.5 STRL IVOR (GLOVE) ×2 IMPLANT
GOWN STRL REUS W/ TWL XL LVL3 (GOWN DISPOSABLE) ×1 IMPLANT
GOWN STRL REUS W/TWL LRG LVL3 (GOWN DISPOSABLE) ×4 IMPLANT
GOWN STRL REUS W/TWL XL LVL3 (GOWN DISPOSABLE) ×2
HEMOSTAT SNOW SURGICEL 2X4 (HEMOSTASIS) ×2 IMPLANT
INST SET LAPROSCOPIC AP (KITS) ×2 IMPLANT
KIT TURNOVER KIT A (KITS) ×2 IMPLANT
MANIFOLD NEPTUNE II (INSTRUMENTS) ×2 IMPLANT
NDL HYPO 25X1 1.5 SAFETY (NEEDLE) ×1 IMPLANT
NDL INSUFFLATION 14GA 120MM (NEEDLE) ×1 IMPLANT
NEEDLE HYPO 25X1 1.5 SAFETY (NEEDLE) ×2 IMPLANT
NEEDLE INSUFFLATION 14GA 120MM (NEEDLE) ×2 IMPLANT
NS IRRIG 1000ML POUR BTL (IV SOLUTION) ×2 IMPLANT
PACK LAP CHOLE LZT030E (CUSTOM PROCEDURE TRAY) ×2 IMPLANT
PAD ARMBOARD 7.5X6 YLW CONV (MISCELLANEOUS) ×2 IMPLANT
SET BASIN LINEN APH (SET/KITS/TRAYS/PACK) ×2 IMPLANT
SLEEVE ENDOPATH XCEL 5M (ENDOMECHANICALS) ×2 IMPLANT
STAPLER VISISTAT (STAPLE) ×1 IMPLANT
SUT MNCRL AB 4-0 PS2 18 (SUTURE) ×2 IMPLANT
SUT VICRYL 0 UR6 27IN ABS (SUTURE) ×2 IMPLANT
SYS BAG RETRIEVAL 10MM (BASKET) ×1
SYSTEM BAG RETRIEVAL 10MM (BASKET) ×1 IMPLANT
TROCAR ENDO BLADELESS 11MM (ENDOMECHANICALS) ×2 IMPLANT
TROCAR XCEL NON-BLD 5MMX100MML (ENDOMECHANICALS) ×2 IMPLANT
TROCAR XCEL UNIV SLVE 11M 100M (ENDOMECHANICALS) ×2 IMPLANT
TUBE CONNECTING 12X1/4 (SUCTIONS) ×2 IMPLANT
TUBING INSUFFLATION (TUBING) ×2 IMPLANT
WARMER LAPAROSCOPE (MISCELLANEOUS) ×2 IMPLANT

## 2018-03-07 NOTE — Anesthesia Procedure Notes (Signed)
Procedure Name: Intubation Date/Time: 03/07/2018 9:57 AM Performed by: Charmaine Downs, CRNA Pre-anesthesia Checklist: Patient identified, Patient being monitored, Timeout performed, Emergency Drugs available and Suction available Patient Re-evaluated:Patient Re-evaluated prior to induction Oxygen Delivery Method: Circle System Utilized Preoxygenation: Pre-oxygenation with 100% oxygen Induction Type: IV induction, Rapid sequence and Cricoid Pressure applied Ventilation: Mask ventilation without difficulty Laryngoscope Size: Mac and 4 Grade View: Grade I Tube type: Oral Tube size: 7.0 mm Number of attempts: 1 Airway Equipment and Method: stylet Placement Confirmation: ETT inserted through vocal cords under direct vision,  positive ETCO2 and breath sounds checked- equal and bilateral Secured at: 22 cm Tube secured with: Tape Dental Injury: Teeth and Oropharynx as per pre-operative assessment

## 2018-03-07 NOTE — Discharge Instructions (Signed)
Laparoscopic Cholecystectomy, Care After °This sheet gives you information about how to care for yourself after your procedure. Your health care provider may also give you more specific instructions. If you have problems or questions, contact your health care provider. °What can I expect after the procedure? °After the procedure, it is common to have: °· Pain at your incision sites. You will be given medicines to control this pain. °· Mild nausea or vomiting. °· Bloating and possible shoulder pain from the air-like gas that was used during the procedure. ° °Follow these instructions at home: °Incision care ° °· Follow instructions from your health care provider about how to take care of your incisions. Make sure you: °? Wash your hands with soap and water before you change your bandage (dressing). If soap and water are not available, use hand sanitizer. °? Change your dressing as told by your health care provider. °? Leave stitches (sutures), skin glue, or adhesive strips in place. These skin closures may need to be in place for 2 weeks or longer. If adhesive strip edges start to loosen and curl up, you may trim the loose edges. Do not remove adhesive strips completely unless your health care provider tells you to do that. °· Do not take baths, swim, or use a hot tub until your health care provider approves. Ask your health care provider if you can take showers. You may only be allowed to take sponge baths for bathing. °· Check your incision area every day for signs of infection. Check for: °? More redness, swelling, or pain. °? More fluid or blood. °? Warmth. °? Pus or a bad smell. °Activity °· Do not drive or use heavy machinery while taking prescription pain medicine. °· Do not lift anything that is heavier than 10 lb (4.5 kg) until your health care provider approves. °· Do not play contact sports until your health care provider approves. °· Do not drive for 24 hours if you were given a medicine to help you relax  (sedative). °· Rest as needed. Do not return to work or school until your health care provider approves. °General instructions °· Take over-the-counter and prescription medicines only as told by your health care provider. °· To prevent or treat constipation while you are taking prescription pain medicine, your health care provider may recommend that you: °? Drink enough fluid to keep your urine clear or pale yellow. °? Take over-the-counter or prescription medicines. °? Eat foods that are high in fiber, such as fresh fruits and vegetables, whole grains, and beans. °? Limit foods that are high in fat and processed sugars, such as fried and sweet foods. °Contact a health care provider if: °· You develop a rash. °· You have more redness, swelling, or pain around your incisions. °· You have more fluid or blood coming from your incisions. °· Your incisions feel warm to the touch. °· You have pus or a bad smell coming from your incisions. °· You have a fever. °· One or more of your incisions breaks open. °Get help right away if: °· You have trouble breathing. °· You have chest pain. °· You have increasing pain in your shoulders. °· You faint or feel dizzy when you stand. °· You have severe pain in your abdomen. °· You have nausea or vomiting that lasts for more than one day. °· You have leg pain. °This information is not intended to replace advice given to you by your health care provider. Make sure you discuss any questions you   have with your health care provider. °Document Released: 11/23/2005 Document Revised: 06/13/2016 Document Reviewed: 05/11/2016 °Elsevier Interactive Patient Education © 2018 [employer] ° ° °General Anesthesia, Adult, Care After °These instructions provide you with information about caring for yourself after your procedure. Your health care provider may also give you more specific instructions. Your treatment has been planned according to current medical practices, but problems sometimes occur.  Call your health care provider if you have any problems or questions after your procedure. °What can I expect after the procedure? °After the procedure, it is common to have: °· Vomiting. °· A sore throat. °· Mental slowness. ° °It is common to feel: °· Nauseous. °· Cold or shivery. °· Sleepy. °· Tired. °· Sore or achy, even in parts of your body where you did not have surgery. ° °Follow these instructions at home: °For at least 24 hours after the procedure: °· Do not: °? Participate in activities where you could fall or become injured. °? Drive. °? Use heavy machinery. °? Drink alcohol. °? Take sleeping pills or medicines that cause drowsiness. °? Make important decisions or sign legal documents. °? Take care of children on your own. °· Rest. °Eating and drinking °· If you vomit, drink water, juice, or soup when you can drink without vomiting. °· Drink enough fluid to keep your urine clear or pale yellow. °· Make sure you have little or no nausea before eating solid foods. °· Follow the diet recommended by your health care provider. °General instructions °· Have a responsible adult stay with you until you are awake and alert. °· Return to your normal activities as told by your health care provider. Ask your health care provider what activities are safe for you. °· Take over-the-counter and prescription medicines only as told by your health care provider. °· If you smoke, do not smoke without supervision. °· Keep all follow-up visits as told by your health care provider. This is important. °Contact a health care provider if: °· You continue to have nausea or vomiting at home, and medicines are not helpful. °· You cannot drink fluids or start eating again. °· You cannot urinate after 8-12 hours. °· You develop a skin rash. °· You have fever. °· You have increasing redness at the site of your procedure. °Get help right away if: °· You have difficulty breathing. °· You have chest pain. °· You have unexpected  bleeding. °· You feel that you are having a life-threatening or urgent problem. °This information is not intended to replace advice given to you by your health care provider. Make sure you discuss any questions you have with your health care provider. °Document Released: 03/01/2001 Document Revised: 04/27/2016 Document Reviewed: 11/07/2015 °Elsevier Interactive Patient Education © 2018 Elsevier Inc[employer] ° °

## 2018-03-07 NOTE — Interval H&P Note (Signed)
History and Physical Interval Note:  03/07/2018 9:34 AM  [place]  has presented today for surgery, with the diagnosis of chronic cholelithiasis  The various methods of treatment have been discussed with the patient and family. After consideration of risks, benefits and other options for treatment, the patient has consented to  Procedure(s): LAPAROSCOPIC CHOLECYSTECTOMY (N/A) as a surgical intervention .  The patient's history has been reviewed, patient examined, no change in status, stable for surgery.  I have reviewed the patient's chart and labs.  Questions were answered to the patient's satisfaction.     Aviva Signs

## 2018-03-07 NOTE — Transfer of Care (Signed)
Immediate Anesthesia Transfer of Care Note  Patient: [place]  Procedure(s) Performed: LAPAROSCOPIC CHOLECYSTECTOMY (N/A )  Patient Location: PACU  Anesthesia Type:General  Level of Consciousness: drowsy and patient cooperative  Airway & Oxygen Therapy: Patient Spontanous Breathing and Patient connected to face mask oxygen  Post-op Assessment: Report given to RN, Post -op Vital signs reviewed and stable and Patient moving all extremities  Post vital signs: Reviewed and stable  Last Vitals:  Vitals Value Taken Time  BP    Temp    Pulse    Resp    SpO2      Last Pain:  Vitals:   03/07/18 0840  TempSrc: Oral  PainSc: 4       Patients Stated Pain Goal: 10 (66/59/93 5701)  Complications: No apparent anesthesia complications

## 2018-03-07 NOTE — Anesthesia Preprocedure Evaluation (Signed)
Anesthesia Evaluation  Patient identified by MRN, date of birth, ID band Patient awake    Reviewed: Allergy & Precautions, NPO status , Patient's Chart, lab work & pertinent test results  Airway Mallampati: II  TM Distance: >3 FB Neck ROM: Full    Dental no notable dental hx.    Pulmonary asthma ,    Pulmonary exam normal breath sounds clear to auscultation       Cardiovascular hypertension, Normal cardiovascular exam Rhythm:Regular Rate:Normal     Neuro/Psych PSYCHIATRIC DISORDERS    GI/Hepatic GERD  ,  Endo/Other    Renal/GU Renal disease     Musculoskeletal   Abdominal   Peds  Hematology   Anesthesia Other Findings   Reproductive/Obstetrics                             Anesthesia Physical Anesthesia Plan  ASA: II  Anesthesia Plan: General   Post-op Pain Management:    Induction: Intravenous  PONV Risk Score and Plan:   Airway Management Planned:   Additional Equipment:   Intra-op Plan:   Post-operative Plan: Extubation in OR  Informed Consent: I have reviewed the patients History and Physical, chart, labs and discussed the procedure including the risks, benefits and alternatives for the proposed anesthesia with the patient or authorized representative who has indicated his/her understanding and acceptance.   Dental advisory given  Plan Discussed with: CRNA  Anesthesia Plan Comments:         Anesthesia Quick Evaluation

## 2018-03-07 NOTE — Anesthesia Postprocedure Evaluation (Signed)
Anesthesia Post Note  Patient: [place]  Procedure(s) Performed: LAPAROSCOPIC CHOLECYSTECTOMY (N/A )  Patient location during evaluation: PACU Anesthesia Type: General Level of consciousness: awake and alert and patient cooperative Pain management: pain level controlled Vital Signs Assessment: post-procedure vital signs reviewed and stable Respiratory status: spontaneous breathing, nonlabored ventilation and respiratory function stable Cardiovascular status: blood pressure returned to baseline Anesthetic complications: yes Anesthetic complication details: PONVComments: Slight amt nausea after 1 hour.Zofran given.with "some relief".     Last Vitals:  Vitals:   03/07/18 1130 03/07/18 1145  BP: 130/70 123/86  Pulse: 87 83  Resp: (!) 21 (!) 22  Temp:    SpO2: 100% 96%    Last Pain:  Vitals:   03/07/18 1145  TempSrc:   PainSc: Asleep                 Ameera Tigue J

## 2018-03-07 NOTE — Op Note (Signed)
Patient:  Melissa Livingston  DOB:  15-Mar-1996  MRN:  [id number]   Preop Diagnosis: Chronic cholecystitis  Postop Diagnosis: Same  Procedure: Laparoscopic cholecystectomy  Surgeon: Aviva Signs, MD  Assistant: Blake Divine, MD  Anes: General endotracheal  Indications: Patient is a 22 year old black female who presents with biliary colic secondary to chronic cholecystitis.  The risks and benefits of the procedure including bleeding, infection, hepatobiliary injury, and the possibility of an open procedure were fully explained to the patient, who gave informed consent.  Procedure note: The patient was placed in supine position.  After induction of general endotracheal anesthesia, the abdomen was prepped and draped using the usual sterile technique with DuraPrep.  Surgical site confirmation was performed.  A supraumbilical incision was made down to the fascia.  A Veress needle was introduced into the abdominal cavity and confirmation of placement was done using the saline drop test.  The abdomen was then insufflated to 16 mmHg pressure.  An 11 mm trocar was introduced into the abdominal cavity under direct visualization without difficulty.  The patient was placed in reverse Trendelenburg position and an additional 11 mm trocar was placed in the epigastric region and 5 mm trochars were placed in the right upper quadrant and right flank regions.  The liver was inspected and noted to be within normal limits.  The gallbladder was retracted in a dynamic fashion in order to provide a critical view of the triangle of Calot.  The cystic duct was first identified.  Its juncture to the infundibulum fully identified.  Endoclips were placed proximally and distally on the cystic duct, and the cystic duct was divided.  This was likewise done to the cystic artery.  The gallbladder was freed away from the gallbladder fossa using Bovie electrocautery.  The gallbladder was delivered through the epigastric trocar site  using an Endo Catch bag.  Gallbladder fossa was inspected and no abnormal bleeding or bile leakage was noted.  Surgicel was placed in the gallbladder fossa.  All fluid and air were then evacuated from the abdominal cavity prior to the removal of the trochars.  All wounds were irrigated with normal saline.  All wounds were injected with 0.5% Sensorcaine.  The supraumbilical fascia was reapproximated using an 0 Vicryl interrupted suture.  All skin incisions were closed using 4-0 Monocryl subcuticular sutures.  Dermabond was applied.  All tape and needle counts were correct at the end of the procedure.  Patient was extubated in the operating room and transferred to PACU in stable condition.  Complications: None  EBL: Minimal  Specimen: Gallbladder

## 2018-03-07 NOTE — Anesthesia Procedure Notes (Signed)
Procedure Name: Intubation Performed by: Charmaine Downs, CRNA Pre-anesthesia Checklist: Patient identified, Patient being monitored, Timeout performed, Emergency Drugs available and Suction available Patient Re-evaluated:Patient Re-evaluated prior to induction Oxygen Delivery Method: Circle System Utilized Preoxygenation: Pre-oxygenation with 100% oxygen Induction Type: IV induction Ventilation: Mask ventilation without difficulty Laryngoscope Size: Mac and 3 Grade View: Grade II Tube type: Oral Tube size: 7.0 mm Number of attempts: 1 Airway Equipment and Method: stylet Placement Confirmation: ETT inserted through vocal cords under direct vision,  positive ETCO2 and breath sounds checked- equal and bilateral Secured at: 21 cm Tube secured with: Tape Dental Injury: Teeth and Oropharynx as per pre-operative assessment

## 2018-03-08 ENCOUNTER — Encounter (HOSPITAL_COMMUNITY): Payer: Self-pay | Admitting: Physical Therapy

## 2018-03-08 ENCOUNTER — Encounter (HOSPITAL_COMMUNITY): Payer: 59 | Admitting: Physical Therapy

## 2018-03-08 ENCOUNTER — Encounter (HOSPITAL_COMMUNITY): Payer: Self-pay | Admitting: General Surgery

## 2018-03-08 NOTE — Therapy (Addendum)
[place], [place], [address] Phone: [phone]   Fax:  [phone]  Patient Details  Name: Melissa Livingston MRN: [id number] Date of Birth: 09-01-1996 Referring Provider:  No ref. provider found  Encounter Date: 03/08/2018  PHYSICAL THERAPY DISCHARGE SUMMARY  Visits from Start of Care: 5  Current functional level related to goals / functional outcomes: Unable to fully assess as patient did not return for re-assessment. See therapy note 02/10/18 for most recent progress.    Remaining deficits: Unable to fully assess as patient did not return for re-assessment. See therapy note 02/10/18 for most recent progress.    Education / Equipment: Patient was provided a home exercise program, however was unable to provide more detailed discharge education as patient did not return to therapy. Therapist called and left a message to inform patient that she was being discharged due to the attendance policy of having 3 consecutive no-shows and to contact clinic with any further questions.  Plan: Patient agrees to discharge.  Patient goals were not met.although it is unclear as patient did not return to therapy Patient is being discharged due to not returning since the last visit.  ?????         Clarene Critchley PT, DPT 8:16 AM, 03/08/18 [place], [place], [address] Phone: [phone]   Fax:  [phone]

## 2018-03-10 ENCOUNTER — Ambulatory Visit (HOSPITAL_COMMUNITY): Payer: 59 | Admitting: Physical Therapy

## 2018-03-15 ENCOUNTER — Encounter: Payer: Self-pay | Admitting: General Surgery

## 2018-03-15 ENCOUNTER — Ambulatory Visit (INDEPENDENT_AMBULATORY_CARE_PROVIDER_SITE_OTHER): Payer: Self-pay | Admitting: General Surgery

## 2018-03-15 VITALS — BP 134/92 | HR 93 | Temp 98.4°F | Ht 63.0 in | Wt 209.0 lb

## 2018-03-15 DIAGNOSIS — Z09 Encounter for follow-up examination after completed treatment for conditions other than malignant neoplasm: Secondary | ICD-10-CM

## 2018-03-15 NOTE — Progress Notes (Signed)
Subjective:     Melissa Livingston  Status post laparoscopic cholecystectomy.  Doing well.  Did remove the Dermabond from her incisions.  She does have mild skin irritation at the incision lines.  No purulent drainage noted.  Her preoperative symptoms have resolved. Objective:    BP (!) 134/92   Pulse 93   Temp 98.4 F (36.9 C)   Ht 5\' 3"  (1.6 m)   Wt 209 lb (94.8 kg)   BMI 37.02 kg/m   General:  alert, cooperative and no distress  Abdomen soft, incisions intact, though some superficial irritation is present which is probably secondary to the subcuticular sutures.  No purulent drainage noted. Final pathology consistent with diagnosis.     Assessment:    Doing well postoperatively.    Plan:   I told the patient to keep her wounds clean and dry with soap and water.  Follow-up as needed.

## 2018-04-19 ENCOUNTER — Ambulatory Visit: Payer: 59 | Admitting: Orthopaedic Surgery

## 2018-04-21 ENCOUNTER — Ambulatory Visit: Payer: 59 | Admitting: Orthopaedic Surgery

## 2018-04-21 ENCOUNTER — Encounter: Payer: Self-pay | Admitting: Orthopaedic Surgery

## 2018-04-21 VITALS — BP 124/79 | HR 82 | Ht 63.0 in | Wt 204.0 lb

## 2018-04-21 DIAGNOSIS — G8929 Other chronic pain: Secondary | ICD-10-CM

## 2018-04-21 DIAGNOSIS — M25562 Pain in left knee: Secondary | ICD-10-CM | POA: Diagnosis not present

## 2018-04-21 NOTE — Progress Notes (Signed)
Patient XB:DZHG S Zalar, female DOB:1996/10/01, 22 y.o. DJM:[id number]  Chief Complaint  Patient presents with  . Knee Pain    left knee    HPI  Edgerton is a 22 y.o. female who has continued pain of the left knee.  She has been to PT, has had conservative treatment and medicine, rest.  She has pain medially of the left knee.  She has more pain with activity and in the end of the day.  She has feeling of giving way.  She has swelling and popping.  I will get a MRI of the knee as she continues to have pain and problem.  She is agreeable to this.  HPI  Body mass index is 36.14 kg/m.  ROS  Review of Systems  HENT: Negative for congestion.   Respiratory: Negative for cough and shortness of breath.   Cardiovascular: Negative for chest pain and leg swelling.  Musculoskeletal: Positive for arthralgias, gait problem and joint swelling.  All other systems reviewed and are negative.   Past Medical History:  Diagnosis Date  . Anemia   . Anxiety   . Asthma    as child  . Endometritis 01/11/2015  . GERD (gastroesophageal reflux disease)   . History of ovarian cyst 01/28/2015  . IBS (irritable bowel syndrome)   . Nausea and vomiting 01/11/2015  . Ovarian cyst 01/14/2015  . Pelvic pain in female 01/11/2015  . Pregnancy induced hypertension    with pregnancy; no meds now.  . Pregnant 02/05/2016    Past Surgical History:  Procedure Laterality Date  . CESAREAN SECTION N/A 09/29/2014   Procedure: CESAREAN SECTION;  Surgeon: Jonnie Kind, MD;  Location: [facility];  Service: Obstetrics;  Laterality: N/A;  . CESAREAN SECTION N/A 09/25/2016   Procedure: CESAREAN SECTION;  Surgeon: Jonnie Kind, MD;  Location: [place];  Service: Obstetrics;  Laterality: N/A;  . CHOLECYSTECTOMY N/A 03/07/2018   Procedure: LAPAROSCOPIC CHOLECYSTECTOMY;  Surgeon: Aviva Signs, MD;  Location: [facility];  Service: General;  Laterality: N/A;  . COLONOSCOPY WITH PROPOFOL N/A 08/02/2017   Dr. Gala Romney: moderate  internal hemorhoids, distal 5 cm of TI also appeared normal  . ESOPHAGOGASTRODUODENOSCOPY (EGD) WITH ESOPHAGEAL DILATION N/A 07/19/2013   Dr. Rourk:normal appearing esophagus s/p dilation, normal D1 and D2, unremarkable path   . ESOPHAGOGASTRODUODENOSCOPY (EGD) WITH PROPOFOL N/A 08/02/2017   Normal esophagus, small hiatal hernia, normal duodenum  . none    . WISDOM TOOTH EXTRACTION      Family History  Problem Relation Age of Onset  . Multiple sclerosis Mother   . Heart disease Maternal Grandfather   . Heart disease Paternal Uncle   . Diabetes Paternal Uncle   . Hypertension Paternal Uncle   . Cancer Other        father's aunt had breast cancer  . Colon cancer Other        maternal great uncle, age greater than 44  . Crohn's disease Other        couple of family members on father's side of family    Social History Social History   Tobacco Use  . Smoking status: Never Smoker  . Smokeless tobacco: Never Used  Substance Use Topics  . Alcohol use: No    Alcohol/week: 0.0 oz  . Drug use: No    Allergies  Allergen Reactions  . Adhesive [Tape] Other (See Comments)    Blisters   . Lorabid [Loracarbef] Hives, Swelling and Other (See Comments)  Mouth swelling   . Latex Itching and Rash  . Orange Fruit [Citrus] Rash    Current Outpatient Medications  Medication Sig Dispense Refill  . acetaminophen (TYLENOL) 325 MG tablet Take 650 mg by mouth every 6 (six) hours as needed for mild pain, moderate pain or headache.     . etonogestrel (NEXPLANON) 68 MG IMPL implant 1 each by Subdermal route once.    Marland Kitchen dexlansoprazole (DEXILANT) 60 MG capsule Take 1 capsule (60 mg total) by mouth daily before breakfast. (Patient not taking: Reported on 04/21/2018) 30 capsule 3  . dicyclomine (BENTYL) 10 MG capsule Take 10 mg by mouth daily.    Marland Kitchen HYDROcodone-acetaminophen (NORCO) 5-325 MG tablet Take 1 tablet by mouth every 4 (four) hours as needed for moderate pain. (Patient not taking: Reported on  04/21/2018) 30 tablet 0  . linaclotide (LINZESS) 290 MCG CAPS capsule Take 1 capsule (290 mcg total) by mouth daily before breakfast. Take 30 minutes (Patient not taking: Reported on 04/21/2018) 90 capsule 3   No current facility-administered medications for this visit.      Physical Exam  Blood pressure 124/79, pulse 82, height 5\' 3"  (1.6 m), weight 204 lb (92.5 kg), not currently breastfeeding.  Constitutional: overall normal hygiene, normal nutrition, well developed, normal grooming, normal body habitus. Assistive device:none  Musculoskeletal: gait and station Limp left, muscle tone and strength are normal, no tremors or atrophy is present.  .  Neurological: coordination overall normal.  Deep tendon reflex/nerve stretch intact.  Sensation normal.  Cranial nerves II-XII intact.   Skin:   Normal overall no scars, lesions, ulcers or rashes. No psoriasis.  Psychiatric: Alert and oriented x 3.  Recent memory intact, remote memory unclear.  Normal mood and affect. Well groomed.  Good eye contact.  Cardiovascular: overall no swelling, no varicosities, no edema bilaterally, normal temperatures of the legs and arms, no clubbing, cyanosis and good capillary refill.  Lymphatic: palpation is normal.  Left knee is tender with mild effusion. ROM is 0 to 110.  She has positive medial McMurray sign. She has limp to the left.  NV intact.   All other systems reviewed and are negative   The patient has been educated about the nature of the problem(s) and counseled on treatment options.  The patient appeared to understand what I have discussed and is in agreement with it.  Encounter Diagnosis  Name Primary?  . Chronic pain of left knee Yes    PLAN Call if any problems.  Precautions discussed.  Continue current medications.   Return to clinic after MRI of the left knee.   Electronically Signed Sanjuana Kava, MD 5/16/201910:46 AM

## 2018-04-25 ENCOUNTER — Telehealth: Payer: Self-pay | Admitting: Orthopaedic Surgery

## 2018-04-25 NOTE — Telephone Encounter (Signed)
Is medicaid primary ? Can you look at this with me ?

## 2018-04-25 NOTE — Telephone Encounter (Signed)
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 Melody with [facility] stated that patient needs pre-cert with [employer](?).   Please call Melody @ [phone].

## 2018-04-26 ENCOUNTER — Ambulatory Visit: Payer: 59 | Admitting: Advanced Practice Midwife

## 2018-04-26 ENCOUNTER — Ambulatory Visit (HOSPITAL_COMMUNITY)
Admission: RE | Admit: 2018-04-26 | Discharge: 2018-04-26 | Disposition: A | Discharge status: Home / Self Care | Payer: 59 | Source: Ambulatory Visit | Attending: Orthopaedic Surgery | Admitting: Orthopaedic Surgery

## 2018-04-26 DIAGNOSIS — G8929 Other chronic pain: Secondary | ICD-10-CM

## 2018-04-26 DIAGNOSIS — M25562 Pain in left knee: Secondary | ICD-10-CM | POA: Insufficient documentation

## 2018-04-28 ENCOUNTER — Ambulatory Visit: Payer: 59 | Admitting: Orthopaedic Surgery

## 2018-04-28 ENCOUNTER — Encounter: Payer: Self-pay | Admitting: Orthopaedic Surgery

## 2018-04-28 VITALS — BP 140/82 | HR 104 | Temp 98.0°F | Ht 63.0 in | Wt 204.0 lb

## 2018-04-28 DIAGNOSIS — D219 Benign neoplasm of connective and other soft tissue, unspecified: Secondary | ICD-10-CM | POA: Diagnosis not present

## 2018-04-28 DIAGNOSIS — M25562 Pain in left knee: Secondary | ICD-10-CM

## 2018-04-28 DIAGNOSIS — G8929 Other chronic pain: Secondary | ICD-10-CM

## 2018-04-28 MED ORDER — HYDROCODONE-ACETAMINOPHEN 5-325 MG PO TABS: ORAL_TABLET | ORAL | 0 refills | Status: DC

## 2018-04-28 MED ORDER — NAPROXEN 500 MG PO TABS: 500.0000 mg | ORAL_TABLET | Freq: Two times a day (BID) | ORAL | 5 refills | Status: DC

## 2018-04-28 NOTE — Progress Notes (Signed)
Patient Melissa Livingston, female DOB:Jul 26, 1996, 22 y.o. VOJ:[id number]  Chief Complaint  Patient presents with  . Knee Pain    left    HPI  Melissa Livingston is a 22 y.o. female who has chronic pain of the left knee.  She had a MRI which showed: IMPRESSION: 1. 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 This appears stable measuring 12 x 13 x 16 mm. 2. Intact cruciate, collateral ligaments and menisci. 3. No focal chondral defects.  I have explained this to her.   Her left knee is bothering her more the last several days.  She has no trauma.  I will begin Naprosyn 500 po bid pc.  Pain medicine will also be given for just five days.  HPI  Body mass index is 36.14 kg/m.  ROS  Review of Systems  HENT: Negative for congestion.   Respiratory: Negative for cough and shortness of breath.   Cardiovascular: Negative for chest pain and leg swelling.  Musculoskeletal: Positive for arthralgias, gait problem and joint swelling.  All other systems reviewed and are negative.   Past Medical History:  Diagnosis Date  . Anemia   . Anxiety   . Asthma    as child  . Endometritis 01/11/2015  . GERD (gastroesophageal reflux disease)   . History of ovarian cyst 01/28/2015  . IBS (irritable bowel syndrome)   . Nausea and vomiting 01/11/2015  . Ovarian cyst 01/14/2015  . Pelvic pain in female 01/11/2015  . Pregnancy induced hypertension    with pregnancy; no meds now.  . Pregnant 02/05/2016    Past Surgical History:  Procedure Laterality Date  . CESAREAN SECTION N/A 09/29/2014   Procedure: CESAREAN SECTION;  Surgeon: Jonnie Kind, MD;  Location: [facility];  Service: Obstetrics;  Laterality: N/A;  . CESAREAN SECTION N/A 09/25/2016   Procedure: CESAREAN SECTION;  Surgeon: Jonnie Kind, MD;  Location: [place];  Service: Obstetrics;  Laterality: N/A;  . CHOLECYSTECTOMY N/A 03/07/2018   Procedure:  LAPAROSCOPIC CHOLECYSTECTOMY;  Surgeon: Aviva Signs, MD;  Location: [facility];  Service: General;  Laterality: N/A;  . COLONOSCOPY WITH PROPOFOL N/A 08/02/2017   Dr. Gala Romney: moderate internal hemorhoids, distal 5 cm of TI also appeared normal  . ESOPHAGOGASTRODUODENOSCOPY (EGD) WITH ESOPHAGEAL DILATION N/A 07/19/2013   Dr. Rourk:normal appearing esophagus s/p dilation, normal D1 and D2, unremarkable path   . ESOPHAGOGASTRODUODENOSCOPY (EGD) WITH PROPOFOL N/A 08/02/2017   Normal esophagus, small hiatal hernia, normal duodenum  . none    . WISDOM TOOTH EXTRACTION      Family History  Problem Relation Age of Onset  . Multiple sclerosis Mother   . Heart disease Maternal Grandfather   . Heart disease Paternal Uncle   . Diabetes Paternal Uncle   . Hypertension Paternal Uncle   . Cancer Other        father's aunt had breast cancer  . Colon cancer Other        maternal great uncle, age greater than 56  . Crohn's disease Other        couple of family members on father's side of family    Social History Social History   Tobacco Use  . Smoking status: Never Smoker  . Smokeless tobacco: Never Used  Substance Use Topics  . Alcohol use: No    Alcohol/week: 0.0 oz  . Drug use: No    Allergies  Allergen Reactions  . Adhesive [Tape] Other (See Comments)  Blisters   . Lorabid [Loracarbef] Hives, Swelling and Other (See Comments)    Mouth swelling   . Latex Itching and Rash  . Orange Fruit [Citrus] Rash    Current Outpatient Medications  Medication Sig Dispense Refill  . acetaminophen (TYLENOL) 325 MG tablet Take 650 mg by mouth every 6 (six) hours as needed for mild pain, moderate pain or headache.     . dicyclomine (BENTYL) 10 MG capsule Take 10 mg by mouth daily.    Marland Kitchen etonogestrel (NEXPLANON) 68 MG IMPL implant 1 each by Subdermal route once.    Marland Kitchen dexlansoprazole (DEXILANT) 60 MG capsule Take 1 capsule (60 mg total) by mouth daily before breakfast. (Patient not taking: Reported on  04/21/2018) 30 capsule 3  . HYDROcodone-acetaminophen (NORCO/VICODIN) 5-325 MG tablet One tablet every four hours as needed for acute pain.  Limit of five days per [place] statue. 30 tablet 0  . linaclotide (LINZESS) 290 MCG CAPS capsule Take 1 capsule (290 mcg total) by mouth daily before breakfast. Take 30 minutes (Patient not taking: Reported on 04/21/2018) 90 capsule 3  . naproxen (NAPROSYN) 500 MG tablet Take 1 tablet (500 mg total) by mouth 2 (two) times daily with a meal. 60 tablet 5   No current facility-administered medications for this visit.      Physical Exam  Blood pressure 140/82, pulse (!) 104, temperature 98 F (36.7 C), height 5\' 3"  (1.6 m), weight 204 lb (92.5 kg), not currently breastfeeding.  Constitutional: overall normal hygiene, normal nutrition, well developed, normal grooming, normal body habitus. Assistive device:none  Musculoskeletal: gait and station Limp none, muscle tone and strength are normal, no tremors or atrophy is present.  .  Neurological: coordination overall normal.  Deep tendon reflex/nerve stretch intact.  Sensation normal.  Cranial nerves II-XII intact.   Skin:   Normal overall no scars, lesions, ulcers or rashes. No psoriasis.  Psychiatric: Alert and oriented x 3.  Recent memory intact, remote memory unclear.  Normal mood and affect. Well groomed.  Good eye contact.  Cardiovascular: overall no swelling, no varicosities, no edema bilaterally, normal temperatures of the legs and arms, no clubbing, cyanosis and good capillary refill.  Lymphatic: palpation is normal.  Left knee has full ROM and no effusion today.  She has some tenderness medially.  NV intact.  All other systems reviewed and are negative   The patient has been educated about the nature of the problem(s) and counseled on treatment options.  The patient appeared to understand what I have discussed and is in agreement with it.  Encounter Diagnoses  Name Primary?  . Chronic pain  of left knee Yes  . Fibroxanthoma     PLAN Call if any problems.  Precautions discussed. Begin Naprosyn.  Return to clinic 6 weeks   I have reviewed the [place] web site prior to prescribing narcotic medicine for this patient.  Electronically Signed Sanjuana Kava, MD 5/23/20198:35 AM

## 2018-05-05 ENCOUNTER — Ambulatory Visit: Payer: 59 | Admitting: Women's Health

## 2018-05-17 ENCOUNTER — Ambulatory Visit: Payer: 59 | Admitting: Women's Health

## 2018-06-01 ENCOUNTER — Telehealth: Payer: Self-pay | Admitting: Gastroenterology

## 2018-06-01 ENCOUNTER — Ambulatory Visit: Payer: 59 | Admitting: Gastroenterology

## 2018-06-01 ENCOUNTER — Encounter: Payer: Self-pay | Admitting: Gastroenterology

## 2018-06-01 NOTE — Telephone Encounter (Signed)
PATIENT WAS A NO SHOW AND LETTER SENT  °

## 2018-06-14 ENCOUNTER — Ambulatory Visit: Payer: 59 | Admitting: Orthopaedic Surgery

## 2018-06-14 ENCOUNTER — Encounter: Payer: Self-pay | Admitting: Orthopaedic Surgery

## 2018-06-22 ENCOUNTER — Ambulatory Visit (INDEPENDENT_AMBULATORY_CARE_PROVIDER_SITE_OTHER): Payer: 59 | Admitting: Orthopaedic Surgery

## 2018-06-22 ENCOUNTER — Encounter: Payer: Self-pay | Admitting: Orthopaedic Surgery

## 2018-06-22 VITALS — BP 129/74 | HR 103 | Ht 63.0 in | Wt 206.0 lb

## 2018-06-22 DIAGNOSIS — M25562 Pain in left knee: Secondary | ICD-10-CM

## 2018-06-22 DIAGNOSIS — D219 Benign neoplasm of connective and other soft tissue, unspecified: Secondary | ICD-10-CM | POA: Diagnosis not present

## 2018-06-22 DIAGNOSIS — G8929 Other chronic pain: Secondary | ICD-10-CM

## 2018-06-22 MED ORDER — HYDROCODONE-ACETAMINOPHEN 5-325 MG PO TABS: ORAL_TABLET | ORAL | 0 refills | Status: DC

## 2018-06-22 NOTE — Progress Notes (Signed)
Patient OB:SJGG Melissa Livingston, female DOB:[birth year], 22 y.o. EZM:[id number]  Chief Complaint  Patient presents with  . Knee Pain    left knee    HPI  Melissa Livingston is a 22 y.o. female who has chronic pain of the left knee.  She is taking her Naprosyn and her pain medicine.  She has good and bad days.  She has more pain with increased activity.  She has no new trauma.   Body mass index is 36.49 kg/m.  ROS  Review of Systems  HENT: Negative for congestion.   Respiratory: Negative for cough and shortness of breath.   Cardiovascular: Negative for chest pain and leg swelling.  Musculoskeletal: Positive for arthralgias, gait problem and joint swelling.  All other systems reviewed and are negative.   All other systems reviewed and are negative.  Past Medical History:  Diagnosis Date  . Anemia   . Anxiety   . Asthma    as child  . Endometritis 01/11/2015  . GERD (gastroesophageal reflux disease)   . History of ovarian cyst 01/28/2015  . IBS (irritable bowel syndrome)   . Nausea and vomiting 01/11/2015  . Ovarian cyst 01/14/2015  . Pelvic pain in female 01/11/2015  . Pregnancy induced hypertension    with pregnancy; no meds now.  . Pregnant 02/05/2016    Past Surgical History:  Procedure Laterality Date  . CESAREAN SECTION N/A 09/29/2014   Procedure: CESAREAN SECTION;  Surgeon: Jonnie Kind, MD;  Location: [facility];  Service: Obstetrics;  Laterality: N/A;  . CESAREAN SECTION N/A 09/25/2016   Procedure: CESAREAN SECTION;  Surgeon: Jonnie Kind, MD;  Location: [place];  Service: Obstetrics;  Laterality: N/A;  . CHOLECYSTECTOMY N/A 03/07/2018   Procedure: LAPAROSCOPIC CHOLECYSTECTOMY;  Surgeon: Aviva Signs, MD;  Location: [facility];  Service: General;  Laterality: N/A;  . COLONOSCOPY WITH PROPOFOL N/A 08/02/2017   Dr. Gala Romney: moderate internal hemorhoids, distal 5 cm of TI also appeared normal  . ESOPHAGOGASTRODUODENOSCOPY (EGD) WITH ESOPHAGEAL DILATION N/A 07/19/2013   Dr.  Rourk:normal appearing esophagus Melissa/p dilation, normal D1 and D2, unremarkable path   . ESOPHAGOGASTRODUODENOSCOPY (EGD) WITH PROPOFOL N/A 08/02/2017   Normal esophagus, small hiatal hernia, normal duodenum  . none    . WISDOM TOOTH EXTRACTION      Family History  Problem Relation Age of Onset  . Multiple sclerosis Mother   . Heart disease Maternal Grandfather   . Heart disease Paternal Uncle   . Diabetes Paternal Uncle   . Hypertension Paternal Uncle   . Cancer Other        father'Melissa aunt had breast cancer  . Colon cancer Other        maternal great uncle, age greater than 9  . Crohn'Melissa disease Other        couple of family members on father'Melissa side of family    Social History Social History   Tobacco Use  . Smoking status: Never Smoker  . Smokeless tobacco: Never Used  Substance Use Topics  . Alcohol use: No    Alcohol/week: 0.0 oz  . Drug use: No    Allergies  Allergen Reactions  . Adhesive [Tape] Other (See Comments)    Blisters   . Lorabid [Loracarbef] Hives, Swelling and Other (See Comments)    Mouth swelling   . Latex Itching and Rash  . Orange Fruit [Citrus] Rash    Current Outpatient Medications  Medication Sig Dispense Refill  . acetaminophen (TYLENOL) 325 MG tablet Take  650 mg by mouth every 6 (six) hours as needed for mild pain, moderate pain or headache.     . dexlansoprazole (DEXILANT) 60 MG capsule Take 1 capsule (60 mg total) by mouth daily before breakfast. (Patient not taking: Reported on 04/21/2018) 30 capsule 3  . dicyclomine (BENTYL) 10 MG capsule Take 10 mg by mouth daily.    Marland Kitchen etonogestrel (NEXPLANON) 68 MG IMPL implant 1 each by Subdermal route once.    Marland Kitchen HYDROcodone-acetaminophen (NORCO/VICODIN) 5-325 MG tablet One tablet every four hours as needed for acute pain.  Limit of five days per [place] statue. 30 tablet 0  . linaclotide (LINZESS) 290 MCG CAPS capsule Take 1 capsule (290 mcg total) by mouth daily before breakfast. Take 30 minutes  (Patient not taking: Reported on 04/21/2018) 90 capsule 3  . naproxen (NAPROSYN) 500 MG tablet Take 1 tablet (500 mg total) by mouth 2 (two) times daily with a meal. 60 tablet 5   No current facility-administered medications for this visit.      Physical Exam  Blood pressure 129/74, pulse (!) 103, height 5\' 3"  (1.6 m), weight 206 lb (93.4 kg), not currently breastfeeding.  Constitutional: overall normal hygiene, normal nutrition, well developed, normal grooming, normal body habitus. Assistive device:none  Musculoskeletal: gait and station Limp none, muscle tone and strength are normal, no tremors or atrophy is present.  .  Neurological: coordination overall normal.  Deep tendon reflex/nerve stretch intact.  Sensation normal.  Cranial nerves II-XII intact.   Skin:   Normal overall no scars, lesions, ulcers or rashes. No psoriasis.  Psychiatric: Alert and oriented x 3.  Recent memory intact, remote memory unclear.  Normal mood and affect. Well groomed.  Good eye contact.  Cardiovascular: overall no swelling, no varicosities, no edema bilaterally, normal temperatures of the legs and arms, no clubbing, cyanosis and good capillary refill.  Lymphatic: palpation is normal.  She has no limp today on the left.  She has diffuse tenderness but no effusion. ROM is full.  NV intact.  She has no distal edema.  All other systems reviewed and are negative   The patient has been educated about the nature of the problem(Melissa) and counseled on treatment options.  The patient appeared to understand what I have discussed and is in agreement with it.  Encounter Diagnoses  Name Primary?  . Chronic pain of left knee Yes  . Fibroxanthoma     PLAN Call if any problems.  Precautions discussed.  Continue current medications.   Return to clinic 3 months   I have reviewed the [place] web site prior to prescribing narcotic medicine for this  patient.  Electronically Signed Sanjuana Kava, MD 7/17/20192:54 PM

## 2018-08-25 ENCOUNTER — Ambulatory Visit (INDEPENDENT_AMBULATORY_CARE_PROVIDER_SITE_OTHER): Payer: 59 | Admitting: Family Medicine

## 2018-08-25 ENCOUNTER — Encounter: Payer: Self-pay | Admitting: Family Medicine

## 2018-08-25 VITALS — BP 132/70 | Temp 98.8°F | Ht 63.0 in | Wt 206.0 lb

## 2018-08-25 DIAGNOSIS — M25562 Pain in left knee: Secondary | ICD-10-CM

## 2018-08-25 DIAGNOSIS — G8929 Other chronic pain: Secondary | ICD-10-CM | POA: Diagnosis not present

## 2018-08-25 DIAGNOSIS — M25561 Pain in right knee: Secondary | ICD-10-CM | POA: Diagnosis not present

## 2018-08-25 DIAGNOSIS — K5900 Constipation, unspecified: Secondary | ICD-10-CM | POA: Diagnosis not present

## 2018-08-25 MED ORDER — TRAMADOL HCL 50 MG PO TABS: ORAL_TABLET | ORAL | 0 refills | Status: DC

## 2018-08-25 NOTE — Patient Instructions (Addendum)
May use Miralax 1-2 times per day until stools become softer and then back off on that.  Stop taking the hydrocodone and instead take the tramadol at night for the knee pain.   Let us know how you're doing via MyChart in the next couple weeks.

## 2018-08-25 NOTE — Progress Notes (Signed)
   Subjective:    Patient ID: Melissa Livingston, female    DOB: [birth year], 22 y.o.   MRN: [id number]  Abdominal Pain  This is a new problem. Episode onset: one month off and on. The pain is located in the generalized abdominal region. The quality of the pain is cramping. Associated symptoms include constipation and nausea. Pertinent negatives include no fever or vomiting. Associated symptoms comments: Back pain. Treatments tried: stool softners, laxatives, linzess.   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 Having a lot of trouble with constipation. LBM: 4-5 days ago, hard, straining, blood on tissue when wiping only after difficult BM. Nausea but no vomiting, reports feeling regurgitation. Taking dexilant daily. Reports mid-upper abdominal pain, described as sharp and cramping, intermittent throughout the day, 9/10 on pain scale. Denies blood in stool. Symptoms of cramping abd pain and nausea have been going on about 1 week.  Has been having issues with constipation for a long time.   Has tried otc stool softerners and Linzess without relief of constipation.  Reports drinking water and eating a variety of fruits and vegetables.  Reports taking hydrocodone for knee pain once nightly.  Sees orthopedist every 3 months.  Review of Systems  Constitutional: Negative for fever.  Gastrointestinal: Positive for abdominal pain, anal bleeding, constipation and nausea. Negative for blood in stool and vomiting.       Objective:   Physical Exam  Constitutional: She is oriented to person, place, and time. She appears well-developed and well-nourished. No distress.  HENT:  Head: Normocephalic and atraumatic.  Eyes: Right eye exhibits no discharge. Left eye exhibits no discharge.  Cardiovascular: Normal rate, regular rhythm and normal heart sounds.  Pulmonary/Chest: Effort normal and breath sounds normal. No respiratory distress.  Abdominal: Soft.  Bowel sounds are normal. She exhibits no distension and no mass. There is no hepatosplenomegaly. There is generalized tenderness (pain worse in epigastric area) and tenderness in the epigastric area.  Neurological: She is alert and oriented to person, place, and time.  Skin: Skin is warm and dry.  Psychiatric: She has a normal mood and affect.  Nursing note and vitals reviewed.       Assessment & Plan:  1. Constipation, unspecified constipation type  Most likely r/t use of hydrocodone at night. Discussed with patient that she should stop the hydrocodone and we will start tramadol for her at night prn for her knee pain instead to minimize the constipating effects. Also recommended she try miralax 1-2 times per day until her BM are soft and then back off of that medication. Encouraged her to stay well hydrated with water and eat a variety of fruits and vegetables. She will f/u with [place] via my chart in a couple weeks to let us know how she is doing. We will send a note to her orthopedist letting them know the changes made.

## 2018-09-21 ENCOUNTER — Encounter: Payer: Self-pay | Admitting: Orthopaedic Surgery

## 2018-09-21 ENCOUNTER — Ambulatory Visit (INDEPENDENT_AMBULATORY_CARE_PROVIDER_SITE_OTHER): Payer: 59 | Admitting: Orthopaedic Surgery

## 2018-09-21 VITALS — BP 131/81 | HR 71 | Ht 63.0 in | Wt 206.0 lb

## 2018-09-21 DIAGNOSIS — M25562 Pain in left knee: Secondary | ICD-10-CM

## 2018-09-21 DIAGNOSIS — G8929 Other chronic pain: Secondary | ICD-10-CM | POA: Diagnosis not present

## 2018-09-21 MED ORDER — NAPROXEN 500 MG PO TABS: 500.0000 mg | ORAL_TABLET | Freq: Two times a day (BID) | ORAL | 5 refills | Status: DC

## 2018-09-21 NOTE — Progress Notes (Signed)
Patient IR:Melissa Livingston, female DOB:1996/10/22, 22 y.o. FYB:[id number]  Chief Complaint  Patient presents with  . Knee Pain    Also having pain in hip going to the foot    HPI  Melissa Livingston is a 22 y.o. female who has left knee pain with some pain of the left lower leg and foot at times.  She has no new trauma.  She has no redness.  She has been taking her Naprosyn which helps.  She has no numbness.   Body mass index is 36.49 kg/m.  ROS  Review of Systems  HENT: Negative for congestion.   Respiratory: Negative for cough and shortness of breath.   Cardiovascular: Negative for chest pain and leg swelling.  Musculoskeletal: Positive for arthralgias, gait problem and joint swelling.  All other systems reviewed and are negative.   All other systems reviewed and are negative.  The following is a summary of the past history medically, past history surgically, known current medicines, social history and family history.  This information is gathered electronically by the computer from prior information and documentation.  I review this each visit and have found including this information at this point in the chart is beneficial and informative.    Past Medical History:  Diagnosis Date  . Anemia   . Anxiety   . Asthma    as child  . Endometritis 01/11/2015  . GERD (gastroesophageal reflux disease)   . History of ovarian cyst 01/28/2015  . IBS (irritable bowel syndrome)   . Nausea and vomiting 01/11/2015  . Ovarian cyst 01/14/2015  . Pelvic pain in female 01/11/2015  . Pregnancy induced hypertension    with pregnancy; no meds now.  . Pregnant 02/05/2016    Past Surgical History:  Procedure Laterality Date  . CESAREAN SECTION N/A 09/29/2014   Procedure: CESAREAN SECTION;  Surgeon: Jonnie Kind, MD;  Location: [facility];  Service: Obstetrics;  Laterality: N/A;  . CESAREAN SECTION N/A 09/25/2016   Procedure: CESAREAN SECTION;  Surgeon: Jonnie Kind, MD;  Location: [facility];   Service: Obstetrics;  Laterality: N/A;  . CHOLECYSTECTOMY N/A 03/07/2018   Procedure: LAPAROSCOPIC CHOLECYSTECTOMY;  Surgeon: Aviva Signs, MD;  Location: [facility];  Service: General;  Laterality: N/A;  . COLONOSCOPY WITH PROPOFOL N/A 08/02/2017   Dr. Gala Romney: moderate internal hemorhoids, distal 5 cm of TI also appeared normal  . ESOPHAGOGASTRODUODENOSCOPY (EGD) WITH ESOPHAGEAL DILATION N/A 07/19/2013   Dr. Rourk:normal appearing esophagus s/p dilation, normal D1 and D2, unremarkable path   . ESOPHAGOGASTRODUODENOSCOPY (EGD) WITH PROPOFOL N/A 08/02/2017   Normal esophagus, small hiatal hernia, normal duodenum  . none    . WISDOM TOOTH EXTRACTION      Family History  Problem Relation Age of Onset  . Multiple sclerosis Mother   . Heart disease Maternal Grandfather   . Heart disease Paternal Uncle   . Diabetes Paternal Uncle   . Hypertension Paternal Uncle   . Cancer Other        father's aunt had breast cancer  . Colon cancer Other        maternal great uncle, age greater than 60  . Crohn's disease Other        couple of family members on father's side of family    Social History Social History   Tobacco Use  . Smoking status: Never Smoker  . Smokeless tobacco: Never Used  Substance Use Topics  . Alcohol use: No    Alcohol/week: 0.0 standard drinks  .  Drug use: No    Allergies  Allergen Reactions  . Adhesive [Tape] Other (See Comments)    Blisters   . Lorabid [Loracarbef] Hives, Swelling and Other (See Comments)    Mouth swelling   . Latex Itching and Rash  . Orange Fruit [Citrus] Rash    Current Outpatient Medications  Medication Sig Dispense Refill  . acetaminophen (TYLENOL) 325 MG tablet Take 650 mg by mouth every 6 (six) hours as needed for mild pain, moderate pain or headache.     . dexlansoprazole (DEXILANT) 60 MG capsule Take 1 capsule (60 mg total) by mouth daily before breakfast. 30 capsule 3  . dicyclomine (BENTYL) 10 MG capsule Take 10 mg by mouth daily.    Marland Kitchen  etonogestrel (NEXPLANON) 68 MG IMPL implant 1 each by Subdermal route once.    . linaclotide (LINZESS) 290 MCG CAPS capsule Take 1 capsule (290 mcg total) by mouth daily before breakfast. Take 30 minutes 90 capsule 3  . naproxen (NAPROSYN) 500 MG tablet Take 1 tablet (500 mg total) by mouth 2 (two) times daily with a meal. 60 tablet 5  . traMADol (ULTRAM) 50 MG tablet Take one bid prn pain 30 tablet 0   No current facility-administered medications for this visit.      Physical Exam  Blood pressure 131/81, pulse 71, height 5\' 3"  (1.6 m), weight 206 lb (93.4 kg), not currently breastfeeding.  Constitutional: overall normal hygiene, normal nutrition, well developed, normal grooming, normal body habitus. Assistive device:none  Musculoskeletal: gait and station Limp none, muscle tone and strength are normal, no tremors or atrophy is present.  .  Neurological: coordination overall normal.  Deep tendon reflex/nerve stretch intact.  Sensation normal.  Cranial nerves II-XII intact.   Skin:   Normal overall no scars, lesions, ulcers or rashes. No psoriasis.  Psychiatric: Alert and oriented x 3.  Recent memory intact, remote memory unclear.  Normal mood and affect. Well groomed.  Good eye contact.  Cardiovascular: overall no swelling, no varicosities, no edema bilaterally, normal temperatures of the legs and arms, no clubbing, cyanosis and good capillary refill.  Lymphatic: palpation is normal.  Left knee is slightly tender, no effusion, full ROM, no limp. All other systems reviewed and are negative   The patient has been educated about the nature of the problem(s) and counseled on treatment options.  The patient appeared to understand what I have discussed and is in agreement with it.  Encounter Diagnosis  Name Primary?  . Chronic pain of left knee Yes    PLAN Call if any problems.  Precautions discussed.  Continue current medications.   Return to clinic 3 months   Electronically  Signed Sanjuana Kava, MD 10/16/20193:42 PM

## 2018-11-14 ENCOUNTER — Encounter: Payer: Self-pay | Admitting: Family Medicine

## 2018-11-14 ENCOUNTER — Ambulatory Visit (INDEPENDENT_AMBULATORY_CARE_PROVIDER_SITE_OTHER): Payer: 59 | Admitting: Family Medicine

## 2018-11-14 VITALS — BP 118/74 | Temp 98.9°F | Ht 63.0 in | Wt 205.8 lb

## 2018-11-14 DIAGNOSIS — J019 Acute sinusitis, unspecified: Secondary | ICD-10-CM

## 2018-11-14 DIAGNOSIS — B9689 Other specified bacterial agents as the cause of diseases classified elsewhere: Secondary | ICD-10-CM

## 2018-11-14 MED ORDER — DOXYCYCLINE HYCLATE 100 MG PO CAPS: 100.0000 mg | ORAL_CAPSULE | Freq: Two times a day (BID) | ORAL | 0 refills | Status: DC

## 2018-11-14 NOTE — Progress Notes (Signed)
   Subjective:    Patient ID: Melissa Livingston, female    DOB: 09-29-1996, 22 y.o.   MRN: [id number]  Sinusitis  This is a new problem. Episode onset: 4 days. Associated symptoms include congestion, coughing, ear pain, headaches and a sore throat. Pertinent negatives include no shortness of breath. Treatments tried: dayquil, nyquil, fever reducer.   Started off several days ago with head congestion drainage coughing some sneezing then progressed into having fever headache sinus pressure pain and discomfort no wheezing or difficulty breathing  PMH benign Review of Systems  Constitutional: Negative for activity change and fever.  HENT: Positive for congestion, ear pain, rhinorrhea and sore throat.   Eyes: Negative for discharge.  Respiratory: Positive for cough. Negative for shortness of breath and wheezing.   Cardiovascular: Negative for chest pain.  Neurological: Positive for headaches.       Objective:   Physical Exam  Constitutional: She appears well-developed.  HENT:  Head: Normocephalic.  Nose: Nose normal.  Mouth/Throat: Oropharynx is clear and moist. No oropharyngeal exudate.  Neck: Neck supple.  Cardiovascular: Normal rate and normal heart sounds.  No murmur heard. Pulmonary/Chest: Effort normal and breath sounds normal. She has no wheezes.  Lymphadenopathy:    She has no cervical adenopathy.  Skin: Skin is warm and dry.  Nursing note and vitals reviewed.         Assessment & Plan:  Patient was seen today for upper respiratory illness. It is felt that the patient is dealing with sinusitis.  Antibiotics were prescribed today. Importance of compliance with medication was discussed.  Symptoms should gradually resolve over the course of the next several days. If high fevers, progressive illness, difficulty breathing, worsening condition or failure for symptoms to improve over the next several days then the patient is to follow-up.  If any emergent conditions the patient is  to follow-up in the emergency department otherwise to follow-up in the office.

## 2018-12-01 ENCOUNTER — Telehealth: Payer: Self-pay | Admitting: Family Medicine

## 2018-12-01 NOTE — Telephone Encounter (Signed)
Vaccine record ready for pickup. Pt notified.  

## 2018-12-01 NOTE — Telephone Encounter (Signed)
Pt requesting copy of her shot record  Please call when ready

## 2018-12-12 ENCOUNTER — Encounter: Payer: Self-pay | Admitting: Family Medicine

## 2018-12-14 ENCOUNTER — Encounter: Payer: Self-pay | Admitting: Family Medicine

## 2018-12-14 ENCOUNTER — Ambulatory Visit (INDEPENDENT_AMBULATORY_CARE_PROVIDER_SITE_OTHER): Payer: 59 | Admitting: Family Medicine

## 2018-12-14 VITALS — BP 110/78 | Temp 98.8°F | Ht 63.0 in | Wt 203.0 lb

## 2018-12-14 DIAGNOSIS — J019 Acute sinusitis, unspecified: Secondary | ICD-10-CM | POA: Diagnosis not present

## 2018-12-14 DIAGNOSIS — R6889 Other general symptoms and signs: Secondary | ICD-10-CM | POA: Diagnosis not present

## 2018-12-14 MED ORDER — AMOXICILLIN-POT CLAVULANATE 875-125 MG PO TABS: 1.0000 | ORAL_TABLET | Freq: Two times a day (BID) | ORAL | 0 refills | Status: DC

## 2018-12-14 NOTE — Progress Notes (Signed)
   Subjective:    Patient ID: Melissa Livingston, female    DOB: [birth year], 23 y.o.   MRN: [id number]  HPI  Patient is here today due to a headache,cough,sore throat,sinus drainage and stopped up nose for the last three days. She states her girls were just here and was diagnosed with a viral illness. She has been taking Day Quil, Night Quil, Hot tea w honey and lemon,Tylenol.  Review of Systems  Constitutional: Negative for activity change and fever.  HENT: Positive for congestion and rhinorrhea. Negative for ear pain.   Eyes: Negative for discharge.  Respiratory: Positive for cough. Negative for shortness of breath and wheezing.   Cardiovascular: Negative for chest pain.       Objective:   Physical Exam Vitals signs and nursing note reviewed.  Constitutional:      Appearance: She is well-developed.  HENT:     Head: Normocephalic.     Nose: Nose normal.     Mouth/Throat:     Pharynx: No oropharyngeal exudate.  Neck:     Musculoskeletal: Neck supple.  Cardiovascular:     Rate and Rhythm: Normal rate.     Heart sounds: Normal heart sounds. No murmur.  Pulmonary:     Effort: Pulmonary effort is normal.     Breath sounds: Normal breath sounds. No wheezing.  Lymphadenopathy:     Cervical: No cervical adenopathy.  Skin:    General: Skin is warm and dry.           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

## 2018-12-21 ENCOUNTER — Ambulatory Visit: Payer: 59 | Admitting: Orthopaedic Surgery

## 2018-12-27 ENCOUNTER — Encounter: Payer: Self-pay | Admitting: Orthopaedic Surgery

## 2018-12-27 ENCOUNTER — Ambulatory Visit (INDEPENDENT_AMBULATORY_CARE_PROVIDER_SITE_OTHER): Payer: Managed Care, Other (non HMO) | Admitting: Orthopaedic Surgery

## 2018-12-27 VITALS — BP 134/88 | HR 81 | Ht 63.0 in | Wt 206.0 lb

## 2018-12-27 DIAGNOSIS — G8929 Other chronic pain: Secondary | ICD-10-CM | POA: Diagnosis not present

## 2018-12-27 DIAGNOSIS — M25562 Pain in left knee: Secondary | ICD-10-CM

## 2018-12-27 MED ORDER — HYDROCODONE-ACETAMINOPHEN 5-325 MG PO TABS: ORAL_TABLET | ORAL | 0 refills | Status: DC

## 2018-12-27 NOTE — Progress Notes (Signed)
CC:  I have pain of my left knee. I would like an injection.  The patient has chronic pain of the left knee.  There is no recent trauma.  There is no redness.  Injections in the past have helped.  The knee has no redness, has an effusion and crepitus present.  ROM of the left knee is 0-110.  Impression:  Chronic knee pain left  Return: 1 month  PROCEDURE NOTE:  The patient requests injections of the left knee, verbal consent was obtained.  The left knee was prepped appropriately after time out was performed.   Sterile technique was observed and injection of 1 cc of Depo-Medrol 40 mg with several cc's of plain xylocaine. Anesthesia was provided by ethyl chloride and a 20-gauge needle was used to inject the knee area. The injection was tolerated well.  A band aid dressing was applied.  The patient was advised to apply ice later today and tomorrow to the injection sight as needed.  I have reviewed the [facility] web site prior to prescribing narcotic medicine for this patient.   Electronically Signed Sanjuana Kava, MD 1/21/20203:20 PM

## 2018-12-30 ENCOUNTER — Ambulatory Visit (INDEPENDENT_AMBULATORY_CARE_PROVIDER_SITE_OTHER): Payer: 59 | Admitting: Family Medicine

## 2018-12-30 ENCOUNTER — Encounter: Payer: Self-pay | Admitting: Family Medicine

## 2018-12-30 DIAGNOSIS — R51 Headache: Secondary | ICD-10-CM | POA: Diagnosis not present

## 2018-12-30 DIAGNOSIS — R519 Headache, unspecified: Secondary | ICD-10-CM

## 2018-12-30 MED ORDER — NORTRIPTYLINE HCL 25 MG PO CAPS: ORAL_CAPSULE | ORAL | 2 refills | Status: DC

## 2018-12-30 MED ORDER — KETOROLAC TROMETHAMINE 10 MG PO TABS: ORAL_TABLET | ORAL | 0 refills | Status: DC

## 2018-12-30 NOTE — Progress Notes (Signed)
   Subjective:    Patient ID: Melissa Livingston, female    DOB: Oct 22, 1996, 23 y.o.   MRN: [id number]  Headache   This is a recurrent problem. The current episode started more than 1 month ago. Pain location: varies; if mild headache it will be in one spot; if severe, headache goes all way across forehead  The pain does not radiate. Similar to prior headaches: when pt was pregnant. Associated symptoms comments: Left eye twitching. Treatments tried: OTC migraine; excedring extra strenght. The treatment provided no relief.   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       Objective:   Physical Exam Alert and oriented, vitals reviewed and stable, NAD ENT-TM's and ext canals WNL bilat via otoscopic exam Soft palate, tonsils and post pharynx WNL via oropharyngeal exam Neck-symmetric, no masses; thyroid nonpalpable and nontender Pulmonary-no tachypnea or accessory muscle use; Clear without wheezes via auscultation Card--no abnrml murmurs, rhythm reg and rate WNL Carotid pulses symmetric, without bruits        Assessment & Plan:  Impression mixed tension vascular headaches.  Distribution frontal radiating into the nuchal.  Daily headaches.  At the same time often throbbing content with photophobia.  Under quite a bit of stress these days.  Options discussed.  We will do a trial of nortriptyline.  If continues to persist may need to reconsider neplanon, could be contibuting  discussed  2.  Eyes twitching.  Discussed.  Periocular fasciculations.  At this stage and under stress high likelihood is benign.  Discussed.  No further tests recommended  Greater than 50% of this 25 minute face to face visit was spent in counseling and discussion and coordination of care regarding the above diagnosis/diagnosies     Follow-up with Dr. Linward Natal headache interval and severity/compliance with meds discussed and encouraged

## 2019-01-01 DIAGNOSIS — R519 Headache, unspecified: Secondary | ICD-10-CM | POA: Insufficient documentation

## 2019-01-01 DIAGNOSIS — R51 Headache: Principal | ICD-10-CM

## 2019-01-04 ENCOUNTER — Encounter: Payer: Self-pay | Admitting: Family Medicine

## 2019-01-12 ENCOUNTER — Ambulatory Visit (INDEPENDENT_AMBULATORY_CARE_PROVIDER_SITE_OTHER): Payer: Managed Care, Other (non HMO) | Admitting: Family Medicine

## 2019-01-12 ENCOUNTER — Emergency Department (HOSPITAL_COMMUNITY)
Admission: EM | Admit: 2019-01-12 | Discharge: 2019-01-12 | Disposition: A | Discharge status: Home / Self Care | Payer: 59 | Attending: Emergency Medicine | Admitting: Emergency Medicine

## 2019-01-12 ENCOUNTER — Encounter: Payer: Self-pay | Admitting: Family Medicine

## 2019-01-12 ENCOUNTER — Encounter (HOSPITAL_COMMUNITY): Payer: Self-pay | Admitting: Emergency Medicine

## 2019-01-12 ENCOUNTER — Other Ambulatory Visit: Payer: Self-pay

## 2019-01-12 VITALS — BP 110/78 | HR 150 | Temp 99.0°F | Ht 63.0 in | Wt 210.0 lb

## 2019-01-12 DIAGNOSIS — E860 Dehydration: Secondary | ICD-10-CM | POA: Diagnosis not present

## 2019-01-12 DIAGNOSIS — J101 Influenza due to other identified influenza virus with other respiratory manifestations: Secondary | ICD-10-CM | POA: Insufficient documentation

## 2019-01-12 DIAGNOSIS — J111 Influenza due to unidentified influenza virus with other respiratory manifestations: Secondary | ICD-10-CM | POA: Diagnosis not present

## 2019-01-12 DIAGNOSIS — Z91040 Latex allergy status: Secondary | ICD-10-CM | POA: Insufficient documentation

## 2019-01-12 DIAGNOSIS — Z79899 Other long term (current) drug therapy: Secondary | ICD-10-CM | POA: Insufficient documentation

## 2019-01-12 DIAGNOSIS — J45909 Unspecified asthma, uncomplicated: Secondary | ICD-10-CM | POA: Insufficient documentation

## 2019-01-12 DIAGNOSIS — J019 Acute sinusitis, unspecified: Secondary | ICD-10-CM

## 2019-01-12 DIAGNOSIS — R000 Tachycardia, unspecified: Secondary | ICD-10-CM

## 2019-01-12 LAB — INFLUENZA PANEL BY PCR (TYPE A & B)
INFLBPCR: NEGATIVE
Influenza A By PCR: POSITIVE — AB

## 2019-01-12 MED ORDER — IBUPROFEN 800 MG PO TABS
800.0000 mg | ORAL_TABLET | Freq: Once | ORAL | Status: AC
  Administered 2019-01-12: 800 mg via ORAL
  Filled 2019-01-12: qty 1

## 2019-01-12 MED ORDER — AZITHROMYCIN 250 MG PO TABS: ORAL_TABLET | ORAL | 0 refills | Status: DC

## 2019-01-12 MED ORDER — SODIUM CHLORIDE 0.9 % IV BOLUS
1000.0000 mL | Freq: Once | INTRAVENOUS | Status: AC
  Administered 2019-01-12: 1000 mL via INTRAVENOUS

## 2019-01-12 MED ORDER — BENZONATATE 100 MG PO CAPS
200.0000 mg | ORAL_CAPSULE | Freq: Once | ORAL | Status: AC
  Administered 2019-01-12: 200 mg via ORAL
  Filled 2019-01-12: qty 2

## 2019-01-12 MED ORDER — ONDANSETRON HCL 8 MG PO TABS: 8.0000 mg | ORAL_TABLET | Freq: Three times a day (TID) | ORAL | 0 refills | Status: DC | PRN

## 2019-01-12 MED ORDER — SODIUM CHLORIDE 0.9 % IV BOLUS
1000.0000 mL | Freq: Once | INTRAVENOUS | Status: AC
  Administered 2019-01-12 (×2): 1000 mL via INTRAVENOUS

## 2019-01-12 MED ORDER — NAPROXEN 500 MG PO TABS: 500.0000 mg | ORAL_TABLET | Freq: Two times a day (BID) | ORAL | 0 refills | Status: DC

## 2019-01-12 MED ORDER — OSELTAMIVIR PHOSPHATE 75 MG PO CAPS: 75.0000 mg | ORAL_CAPSULE | Freq: Two times a day (BID) | ORAL | 0 refills | Status: DC

## 2019-01-12 MED ORDER — BENZONATATE 100 MG PO CAPS: 100.0000 mg | ORAL_CAPSULE | Freq: Three times a day (TID) | ORAL | 0 refills | Status: DC

## 2019-01-12 MED ORDER — ACETAMINOPHEN 500 MG PO TABS
1000.0000 mg | ORAL_TABLET | Freq: Once | ORAL | Status: AC
  Administered 2019-01-12: 1000 mg via ORAL
  Filled 2019-01-12: qty 2

## 2019-01-12 NOTE — ED Triage Notes (Signed)
Dr Wolfgang Phoenix brought patient to ED.  Seen in her office for flu like symptoms. Increased HR, headache, fever

## 2019-01-12 NOTE — Progress Notes (Signed)
   Subjective:    Patient ID: Melissa Livingston, female    DOB: 1996/09/16, 23 y.o.   MRN: [id number]  HPI  Patient is here today sore throat,cough,bilateral ear pain with the left hurt the most,headache,elevated heart rate,right hand tingling.  Started yesterday am she woke with her tongue hurting. All of her symptoms in the past 24 hours She has been using Robitussin Dm with Honey,Tylenol. Patient feels like her heart is racing feels lightheaded feels very bad body aches headache sweats chills She states she is feeling light headed after sitting up from lying down after ekg.  Review of Systems  Constitutional: Negative for activity change and fever.  HENT: Positive for congestion and rhinorrhea. Negative for ear pain.   Eyes: Negative for discharge.  Respiratory: Positive for cough. Negative for shortness of breath and wheezing.   Cardiovascular: Negative for chest pain and leg swelling.       Objective:   Physical Exam Vitals signs and nursing note reviewed.  Constitutional:      Appearance: She is well-developed.  HENT:     Head: Normocephalic.     Nose: Nose normal.     Mouth/Throat:     Pharynx: No oropharyngeal exudate.  Neck:     Musculoskeletal: Neck supple.  Cardiovascular:     Rate and Rhythm: Tachycardia present.     Heart sounds: Normal heart sounds. No murmur.  Pulmonary:     Effort: Pulmonary effort is normal.     Breath sounds: Normal breath sounds. No wheezing.  Lymphadenopathy:     Cervical: No cervical adenopathy.  Skin:    General: Skin is warm and dry.    EKG shows tachycardia 146 beats minute no acute ST segment changes       Assessment & Plan:  Influenza-the patient was diagnosed with influenza. Patient/family educated about the flu and warning signs to watch for. If difficulty breathing,  cyanosis, disorientation, or progressive worsening then immediately get rechecked at the ER. If progressive symptoms be certain to be rechecked. Supportive  measures such as Tylenol/ibuprofen was discussed. No aspirin use in children.  Tachycardia related to her flu because the patient feels so bad we will go ahead and help her over to the ER more than likely they will go ahead and do further testing and probable IV fluids  Medication Tamiflu Zofran was sent in.  Patient also should be noted that Z-Pak was sent in because upper chest congestion coughing in wanting to make sure we did not miss pneumonia

## 2019-01-12 NOTE — ED Notes (Signed)
Pt dropped one Tylenol & one tessalon tablet on floor. Unable to locate under bed. Pulled each from pyxis to complete ordered dosage.

## 2019-01-12 NOTE — ED Provider Notes (Addendum)
[facility] EMERGENCY DEPARTMENT Provider Note   CSN: [id number] Arrival date & time: 01/12/19  1738     History   Chief Complaint Chief Complaint  Patient presents with  . Tachycardia    HPI [place] is a 23 y.o. female.  HPI  The patient is a 23 year old female, history of childhood asthma and multiple sick exposures at home with the flu - children, sig other and parents - she started having dry mouth and tongue yesterday, had aches and coughing with fever today and when she went tot he PCP office with Dr. Wolfgang Phoenix was found to have a tachycardia in the 140 range with a temperature of 101.4.  The patient denies taking any medications prior to arrival.  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  Her symptoms are persistent, nothing makes this better or worse, no associated nausea vomiting or diarrhea.  Past Medical History:  Diagnosis Date  . Anemia   . Anxiety   . Asthma    as child  . Endometritis 01/11/2015  . GERD (gastroesophageal reflux disease)   . History of ovarian cyst 01/28/2015  . IBS (irritable bowel syndrome)   . Nausea and vomiting 01/11/2015  . Ovarian cyst 01/14/2015  . Pelvic pain in female 01/11/2015  . Pregnancy induced hypertension    with pregnancy; no meds now.  . Pregnant 02/05/2016    Patient Active Problem List   Diagnosis Date Noted  . Headache disorder 01/01/2019  . Chronic pain of both knees 08/25/2018  . Constipation 08/25/2018  . Chronic cholecystitis   . Nexplanon in place 01/03/2018  . Abdominal pain, chronic, generalized 12/13/2017  . Acute left-sided low back pain with left-sided sciatica 01/09/2017  . Morbid obesity ([place]) 01/09/2017  . Encounter for well woman exam with routine gynecological exam 11/03/2016  . Muscle spasm 11/03/2016  . S/P cesarean section 03/04/2016  . Abnormal Pap smear of cervix 03/04/2016  . Pyelonephritis, acute 06/13/2015  . 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 Hypokalemia 06/13/2015  . Normocytic anemia 06/13/2015  . Total bilirubin, elevated 06/13/2015  . Hyponatremia 06/13/2015  . Hypomagnesemia 06/13/2015  . Dyspepsia 05/08/2015  . Dysphagia, pharyngoesophageal phase 05/08/2015  . Hematochezia 05/08/2015  . Oliguria 04/23/2015  . History of gestational hypertension 04/23/2015  . Ovarian cyst 01/14/2015  . Pelvic pain in female 01/11/2015  . Nausea and vomiting 01/11/2015  . Endometritis 01/11/2015  . Rectal bleeding 07/18/2013  . GERD (gastroesophageal reflux disease) 07/18/2013  . Esophageal dysphagia 07/18/2013  . Abdominal pain, epigastric 07/18/2013  . Gastritis 07/03/2013  . Irritable bowel syndrome 07/03/2013  . Depression 07/03/2013  . Asthma 03/27/2013  . FRACTURE, TOE 02/22/2008    Past Surgical History:  Procedure Laterality Date  . CESAREAN SECTION N/A 09/29/2014   Procedure: CESAREAN SECTION;  Surgeon: Jonnie Kind, MD;  Location: [facility];  Service: Obstetrics;  Laterality: N/A;  . CESAREAN SECTION N/A 09/25/2016   Procedure: CESAREAN SECTION;  Surgeon: Jonnie Kind, MD;  Location: [place];  Service: Obstetrics;  Laterality: N/A;  . CHOLECYSTECTOMY N/A 03/07/2018   Procedure: LAPAROSCOPIC CHOLECYSTECTOMY;  Surgeon: Aviva Signs, MD;  Location: [facility];  Service: General;  Laterality: N/A;  . COLONOSCOPY WITH PROPOFOL N/A 08/02/2017   Dr. Gala Romney: moderate internal hemorhoids, distal 5 cm of TI also appeared normal  . ESOPHAGOGASTRODUODENOSCOPY (EGD) WITH ESOPHAGEAL DILATION N/A 07/19/2013   Dr. Vivi Ferns appearing  esophagus s/p dilation, normal D1 and D2, unremarkable path   . ESOPHAGOGASTRODUODENOSCOPY (EGD) WITH PROPOFOL N/A 08/02/2017   Normal esophagus, small hiatal hernia, normal duodenum  . none    . WISDOM TOOTH EXTRACTION       OB History    Gravida  2   Para  2   Term  2   Preterm      AB      Living  2     SAB      TAB      Ectopic        Multiple  0   Live Births  2            Home Medications    Prior to Admission medications   Medication Sig Start Date End Date Taking? Authorizing Provider  acetaminophen (TYLENOL) 325 MG tablet Take 650 mg by mouth every 6 (six) hours as needed for mild pain, moderate pain or headache.     [provider]  amoxicillin-clavulanate (AUGMENTIN) 875-125 MG tablet Take 1 tablet by mouth 2 (two) times daily. 12/14/18   Kathyrn Drown, MD  azithromycin (ZITHROMAX Z-PAK) 250 MG tablet Take 2 tablets (500 mg) on  Day 1,  followed by 1 tablet (250 mg) once daily on Days 2 through 5. 01/12/19   Luking, Elayne Snare, MD  benzonatate (TESSALON) 100 MG capsule Take 1 capsule (100 mg total) by mouth every 8 (eight) hours. 01/12/19   Noemi Chapel, MD  dexlansoprazole [facility]) 60 MG capsule Take 1 capsule (60 mg total) by mouth daily before breakfast. 07/12/17   Mahala Menghini, PA-C  dicyclomine (BENTYL) 10 MG capsule Take 10 mg by mouth daily.    [provider]  etonogestrel (NEXPLANON) 68 MG IMPL implant 1 each by Subdermal route once.    [provider]  HYDROcodone-acetaminophen (NORCO/VICODIN) 5-325 MG tablet One tablet every four hours as needed for acute pain.  Limit of five days per [place]. 12/27/18   Sanjuana Kava, MD  ketorolac (TORADOL) 10 MG tablet Take one tablet by mouth every day as needed for severe headaches 12/30/18   Mikey Kirschner, MD  linaclotide [facility]) 290 MCG CAPS capsule Take 1 capsule (290 mcg total) by mouth daily before breakfast. Take 30 minutes 02/02/18   Annitta Needs, NP  naproxen (NAPROSYN) 500 MG tablet Take 1 tablet (500 mg total) by mouth 2 (two) times daily with a meal. 01/12/19   Noemi Chapel, MD  nortriptyline (PAMELOR) 25 MG capsule Take one capsule by mouth at bedtime for 3 days then take two capsules by mouth at bedtime for 7 days, then take three capsules by mouth at bedtime. 12/30/18   Mikey Kirschner, MD  oseltamivir (TAMIFLU)  75 MG capsule Take 1 capsule (75 mg total) by mouth 2 (two) times daily. 01/12/19   Kathyrn Drown, MD    Family History Family History  Problem Relation Age of Onset  . Multiple sclerosis Mother   . Heart disease Maternal Grandfather   . Heart disease Paternal Uncle   . Diabetes Paternal Uncle   . Hypertension Paternal Uncle   . Cancer Other        father's aunt had breast cancer  . Colon cancer Other        maternal great uncle, age greater than 28  . Crohn's disease Other        couple of family members on father's side of family  Social History Social History   Tobacco Use  . Smoking status: Never Smoker  . Smokeless tobacco: Never Used  Substance Use Topics  . Alcohol use: No    Alcohol/week: 0.0 standard drinks  . Drug use: No     Allergies   Adhesive [tape]; Lorabid [loracarbef]; Latex; and Orange fruit [citrus]   Review of Systems Review of Systems  Constitutional: Positive for chills and fever.  HENT: Positive for sore throat. Negative for congestion, ear pain, facial swelling and trouble swallowing.   Eyes: Negative for discharge and redness.  Respiratory: Positive for cough.   Musculoskeletal: Positive for myalgias.  Skin: Negative for rash.  Neurological: Positive for headaches.     Physical Exam Updated Vital Signs BP (!) 123/91   Pulse (!) 122   Temp 99.4 F (37.4 C) (Oral)   Resp 15   Ht 1.6 m (5\' 3" )   Wt 95.3 kg   SpO2 100%   BMI 37.20 kg/m   Physical Exam Vitals signs and nursing note reviewed.  Constitutional:      General: She is not in acute distress.    Appearance: She is well-developed.  HENT:     Head: Normocephalic and atraumatic.     Mouth/Throat:     Pharynx: No oropharyngeal exudate.  Eyes:     General: No scleral icterus.       Right eye: No discharge.        Left eye: No discharge.     Conjunctiva/sclera: Conjunctivae normal.     Pupils: Pupils are equal, round, and reactive to light.  Neck:      Musculoskeletal: Normal range of motion and neck supple.     Thyroid: No thyromegaly.     Vascular: No JVD.  Cardiovascular:     Rate and Rhythm: Regular rhythm. Tachycardia present.     Heart sounds: Normal heart sounds. No murmur. No friction rub. No gallop.   Pulmonary:     Effort: Pulmonary effort is normal. No respiratory distress.     Breath sounds: Normal breath sounds. No wheezing or rales.  Abdominal:     General: Bowel sounds are normal. There is no distension.     Palpations: Abdomen is soft. There is no mass.     Tenderness: There is no abdominal tenderness.  Musculoskeletal: Normal range of motion.        General: No tenderness.  Lymphadenopathy:     Cervical: No cervical adenopathy.  Skin:    General: Skin is warm and dry.     Findings: No erythema or rash.  Neurological:     Mental Status: She is alert.     Coordination: Coordination normal.  Psychiatric:        Behavior: Behavior normal.      ED Treatments / Results  Labs (all labs ordered are listed, but only abnormal results are displayed) Labs Reviewed  INFLUENZA PANEL BY PCR (TYPE A & B) - Abnormal; Notable for the following components:      Result Value   Influenza A By PCR POSITIVE (*)    All other components within normal limits    EKG EKG Interpretation  Date/Time:  Thursday January 12 2019 17:58:09 EST Ventricular Rate:  141 PR Interval:    QRS Duration: 86 QT Interval:  283 QTC Calculation: 434 R Axis:   58 Text Interpretation:  Sinus tachycardia Probable left atrial enlargement Low voltage, precordial leads Borderline T abnormalities, anterior leads Since last tracing still has a sinus tachycardia (  c/w 7/16) Confirmed by Noemi Chapel [phone]) on 01/12/2019 7:00:35 PM   Radiology No results found.  Procedures Procedures (including critical care time)  Medications Ordered in ED Medications  sodium chloride 0.9 % bolus 1,000 mL (1,000 mLs Intravenous New Bag/Given 01/12/19 1818)    benzonatate (TESSALON) capsule 200 mg (200 mg Oral Given 01/12/19 1823)  sodium chloride 0.9 % bolus 1,000 mL (1,000 mLs Intravenous New Bag/Given 01/12/19 1817)  acetaminophen (TYLENOL) tablet 1,000 mg (1,000 mg Oral Given 01/12/19 1823)  ibuprofen (ADVIL,MOTRIN) tablet 800 mg (800 mg Oral Given 01/12/19 1823)     Initial Impression / Assessment and Plan / ED Course  I have reviewed the triage vital signs and the nursing notes.  Pertinent labs & imaging results that were available during my care of the patient were reviewed by me and considered in my medical decision making (see chart for details).  Clinical Course as of Jan 13 1952  Thu Jan 12, 2019  1932 Flu a positive, not surprising and consistent with the patient's symptoms, tachycardia less concerning as the patient has no hypoxia, no hypotension and no tachypnea.   [BM]  1933 She has been given IV fluids and antipyretics, after the fluids are finished she will be stable for discharge.   [BM]    Clinical Course User Index [BM] Noemi Chapel, MD    The patient coughs frequently throughout the exam, she has normal lung sounds, with sore throat headache myalgias fever and coughing I suspect that she has an influenza-like illness especially with multiple sick contacts who have been confirmed to have the flu at home.  She has been prescribed Tamiflu as well as Zithromax (?)  By her family doctor, at this point I think would be reasonable to get her temperature down, give her an antitussive as well as the first dose of Tamiflu.  The patient is otherwise healthy and does not have any significant signs of sepsis with regards to her blood pressure at this time.  I think that the primary source is influenza.  Heart rate improved from 145 down to 120 prior to discharge.  Vitals:   01/12/19 1812 01/12/19 1814 01/12/19 1830 01/12/19 1930  BP: 133/90  126/87 (!) 123/91  Pulse: (!) 140  (!) 136 (!) 122  Resp: 19  (!) 21 15  Temp:    99.4 F (37.4  C)  TempSrc:    Oral  SpO2: 100%  98% 100%  Weight:  95.3 kg    Height:  1.6 m (5\' 3" )       Final Clinical Impressions(s) / ED Diagnoses   Final diagnoses:  Influenza    ED Discharge Orders         Ordered    naproxen (NAPROSYN) 500 MG tablet  2 times daily with meals     01/12/19 1952    benzonatate (TESSALON) 100 MG capsule  Every 8 hours     01/12/19 1952           Noemi Chapel, MD 01/12/19 Langston Reusing    Noemi Chapel, MD 01/12/19 [phone]

## 2019-01-12 NOTE — Discharge Instructions (Signed)
Your testing has confirmed that you have influenza.  Please drink plenty of fluids, Naprosyn 500 mg twice daily for pain and fever, you may alternate this with Tylenol.  Finish the flu medication that your doctor called into the pharmacy for you.  I have also sent a prescription for cough medication called Tessalon.  Please take Tessalon 100mg  by mouth 3 times daily for cough as needed.

## 2019-01-13 ENCOUNTER — Encounter: Payer: Self-pay | Admitting: Family Medicine

## 2019-01-13 ENCOUNTER — Telehealth: Payer: Self-pay | Admitting: Family Medicine

## 2019-01-13 NOTE — Telephone Encounter (Signed)
Pt needs work note Was seen 01/12/2019 & went to ER with the flu  When should pt return to work  Please advise & call pt when note is ready

## 2019-01-13 NOTE — Telephone Encounter (Signed)
Go ahead with work excuse Work excuse can be through next Tuesday Follow-up with [place] if any ongoing issues

## 2019-01-13 NOTE — Telephone Encounter (Signed)
Work excuse is ready up front for pick up.

## 2019-01-13 NOTE — Telephone Encounter (Signed)
Please advise 

## 2019-01-17 ENCOUNTER — Encounter: Payer: Self-pay | Admitting: Family Medicine

## 2019-01-17 ENCOUNTER — Ambulatory Visit (INDEPENDENT_AMBULATORY_CARE_PROVIDER_SITE_OTHER): Payer: Managed Care, Other (non HMO) | Admitting: Family Medicine

## 2019-01-17 VITALS — Temp 98.2°F | Ht 63.0 in | Wt 209.4 lb

## 2019-01-17 DIAGNOSIS — B349 Viral infection, unspecified: Secondary | ICD-10-CM

## 2019-01-17 NOTE — Progress Notes (Signed)
   Subjective:    Patient ID: Melissa Livingston, female    DOB: 1996-08-11, 23 y.o.   MRN: [id number]  HPI  Patient arrives for a follow up on a recent ER visit for influenza. Patient was seen in office last week and diagnosed with flu and also went to ER that night at tested positive for flu and dehydration-currently on Tamiflu.  Patient was in the ER due to dehydration and the flu and tachycardia given 2 L of fluid now feeling much better denies any high fever chills sweats still having some congestion and coughing but no vomiting finished out the azithromycin as well Review of Systems  Constitutional: Negative for activity change and fever.  HENT: Positive for congestion and rhinorrhea. Negative for ear pain.   Eyes: Negative for discharge.  Respiratory: Positive for cough. Negative for shortness of breath and wheezing.   Cardiovascular: Negative for chest pain.       Objective:   Physical Exam Vitals signs and nursing note reviewed.  Constitutional:      Appearance: She is well-developed.  HENT:     Head: Normocephalic.     Nose: Nose normal.     Mouth/Throat:     Pharynx: No oropharyngeal exudate.  Neck:     Musculoskeletal: Neck supple.  Cardiovascular:     Rate and Rhythm: Normal rate.     Heart sounds: Normal heart sounds. No murmur.  Pulmonary:     Effort: Pulmonary effort is normal.     Breath sounds: Normal breath sounds. No wheezing.  Lymphadenopathy:     Cervical: No cervical adenopathy.  Skin:    General: Skin is warm and dry.           Assessment & Plan:  Residual symptoms Viral illness Post flu No sign of bacterial component Warning signs discussed follow-up if progressive troubles may return to work

## 2019-01-24 ENCOUNTER — Encounter: Payer: Self-pay | Admitting: Orthopaedic Surgery

## 2019-01-24 ENCOUNTER — Ambulatory Visit (INDEPENDENT_AMBULATORY_CARE_PROVIDER_SITE_OTHER): Payer: 59

## 2019-01-24 ENCOUNTER — Ambulatory Visit (INDEPENDENT_AMBULATORY_CARE_PROVIDER_SITE_OTHER): Payer: 59 | Admitting: Orthopaedic Surgery

## 2019-01-24 VITALS — BP 136/81 | HR 100 | Ht 63.0 in | Wt 209.0 lb

## 2019-01-24 DIAGNOSIS — M25562 Pain in left knee: Secondary | ICD-10-CM

## 2019-01-24 DIAGNOSIS — G8929 Other chronic pain: Secondary | ICD-10-CM

## 2019-01-24 NOTE — Progress Notes (Signed)
CC:  I have pain of my left knee. I would like an injection.  The patient has chronic pain of the left knee.  There is no recent trauma.  There is no redness.  Injections in the past have helped.  The knee has no redness, has an effusion and crepitus present.  ROM of the left knee is 0-110.  Impression:  Chronic knee pain left  Return: after MRI of the knee  PROCEDURE NOTE:  The patient requests injections of the left knee, verbal consent was obtained.  The left knee was prepped appropriately after time out was performed.   Sterile technique was observed and injection of 1 cc of Depo-Medrol 40 mg with several cc's of plain xylocaine. Anesthesia was provided by ethyl chloride and a 20-gauge needle was used to inject the knee area. The injection was tolerated well.  A band aid dressing was applied.  The patient was advised to apply ice later today and tomorrow to the injection sight as needed.  I will get MRI of the left knee.  X-rays were done of the left knee, reported separately.  Call if any problem.  Precautions discussed.   Electronically Signed Sanjuana Kava, MD 2/18/20203:10 PM

## 2019-01-25 ENCOUNTER — Encounter: Payer: Self-pay | Admitting: *Deleted

## 2019-01-26 ENCOUNTER — Ambulatory Visit: Payer: 59 | Admitting: Advanced Practice Midwife

## 2019-01-29 ENCOUNTER — Emergency Department (HOSPITAL_COMMUNITY): Payer: 59

## 2019-01-29 ENCOUNTER — Emergency Department (HOSPITAL_COMMUNITY)
Admission: EM | Admit: 2019-01-29 | Discharge: 2019-01-30 | Disposition: A | Discharge status: Home / Self Care | Payer: 59 | Attending: Emergency Medicine | Admitting: Emergency Medicine

## 2019-01-29 ENCOUNTER — Encounter (HOSPITAL_COMMUNITY): Payer: Self-pay | Admitting: Emergency Medicine

## 2019-01-29 ENCOUNTER — Other Ambulatory Visit: Payer: Self-pay

## 2019-01-29 DIAGNOSIS — Y999 Unspecified external cause status: Secondary | ICD-10-CM | POA: Insufficient documentation

## 2019-01-29 DIAGNOSIS — J45909 Unspecified asthma, uncomplicated: Secondary | ICD-10-CM | POA: Insufficient documentation

## 2019-01-29 DIAGNOSIS — M542 Cervicalgia: Secondary | ICD-10-CM | POA: Diagnosis present

## 2019-01-29 DIAGNOSIS — Y92410 Unspecified street and highway as the place of occurrence of the external cause: Secondary | ICD-10-CM | POA: Diagnosis not present

## 2019-01-29 DIAGNOSIS — S20212A Contusion of left front wall of thorax, initial encounter: Secondary | ICD-10-CM | POA: Insufficient documentation

## 2019-01-29 DIAGNOSIS — Z79899 Other long term (current) drug therapy: Secondary | ICD-10-CM | POA: Diagnosis not present

## 2019-01-29 DIAGNOSIS — Z91040 Latex allergy status: Secondary | ICD-10-CM | POA: Diagnosis not present

## 2019-01-29 DIAGNOSIS — Y9389 Activity, other specified: Secondary | ICD-10-CM | POA: Diagnosis not present

## 2019-01-29 DIAGNOSIS — M25562 Pain in left knee: Secondary | ICD-10-CM | POA: Insufficient documentation

## 2019-01-29 DIAGNOSIS — S161XXA Strain of muscle, fascia and tendon at neck level, initial encounter: Secondary | ICD-10-CM | POA: Insufficient documentation

## 2019-01-29 MED ORDER — TRAMADOL HCL 50 MG PO TABS: 50.0000 mg | ORAL_TABLET | Freq: Four times a day (QID) | ORAL | 0 refills | Status: DC | PRN

## 2019-01-29 MED ORDER — CYCLOBENZAPRINE HCL 10 MG PO TABS
10.0000 mg | ORAL_TABLET | Freq: Once | ORAL | Status: AC
  Administered 2019-01-29: 10 mg via ORAL
  Filled 2019-01-29: qty 1

## 2019-01-29 MED ORDER — CYCLOBENZAPRINE HCL 10 MG PO TABS: 10.0000 mg | ORAL_TABLET | Freq: Three times a day (TID) | ORAL | 0 refills | Status: DC | PRN

## 2019-01-29 MED ORDER — IBUPROFEN 800 MG PO TABS
800.0000 mg | ORAL_TABLET | Freq: Once | ORAL | Status: AC
  Administered 2019-01-29: 800 mg via ORAL
  Filled 2019-01-29: qty 1

## 2019-01-29 NOTE — ED Notes (Signed)
[employer]ed [employer]ist, water quality deployed   [employer] versus car  No LOC   Now with body aches/discomfort
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
## 2019-01-29 NOTE — Discharge Instructions (Addendum)
Apply ice packs on and off to your neck and knee.  Continue taking your naproxen as directed.  Follow-up with your primary doctor in a few days if your neck pain is not improving.  Return to the ER for any worsening symptoms.

## 2019-01-29 NOTE — ED Notes (Signed)
To Rad 
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
## 2019-01-30 ENCOUNTER — Ambulatory Visit (HOSPITAL_COMMUNITY): Payer: Medicaid Other

## 2019-01-31 ENCOUNTER — Encounter: Payer: Self-pay | Admitting: Family Medicine

## 2019-01-31 ENCOUNTER — Ambulatory Visit (INDEPENDENT_AMBULATORY_CARE_PROVIDER_SITE_OTHER): Payer: Managed Care, Other (non HMO) | Admitting: Family Medicine

## 2019-01-31 VITALS — BP 122/86 | Ht 63.0 in | Wt 210.0 lb

## 2019-01-31 DIAGNOSIS — R51 Headache: Secondary | ICD-10-CM | POA: Diagnosis not present

## 2019-01-31 DIAGNOSIS — R519 Headache, unspecified: Secondary | ICD-10-CM

## 2019-01-31 MED ORDER — NORTRIPTYLINE HCL 25 MG PO CAPS: ORAL_CAPSULE | ORAL | 5 refills | Status: DC
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
## 2019-02-06 ENCOUNTER — Encounter: Payer: Self-pay | Admitting: Adult Health

## 2019-02-06 ENCOUNTER — Ambulatory Visit: Payer: Self-pay | Admitting: Adult Health

## 2019-02-07 ENCOUNTER — Ambulatory Visit (HOSPITAL_COMMUNITY): Payer: 59

## 2019-02-14 ENCOUNTER — Ambulatory Visit (HOSPITAL_COMMUNITY): Payer: Managed Care, Other (non HMO)

## 2019-02-16 ENCOUNTER — Ambulatory Visit: Payer: Medicaid Other | Admitting: Adult Health

## 2019-02-17 ENCOUNTER — Other Ambulatory Visit: Payer: Self-pay

## 2019-02-17 ENCOUNTER — Ambulatory Visit (HOSPITAL_COMMUNITY)
Admission: RE | Admit: 2019-02-17 | Discharge: 2019-02-17 | Disposition: A | Discharge status: Home / Self Care | Payer: Managed Care, Other (non HMO) | Source: Ambulatory Visit | Attending: Orthopaedic Surgery | Admitting: Orthopaedic Surgery

## 2019-02-17 DIAGNOSIS — M25562 Pain in left knee: Secondary | ICD-10-CM | POA: Insufficient documentation

## 2019-02-17 DIAGNOSIS — G8929 Other chronic pain: Secondary | ICD-10-CM | POA: Diagnosis not present

## 2019-02-21 ENCOUNTER — Ambulatory Visit: Payer: 59 | Admitting: Orthopaedic Surgery

## 2019-02-28 ENCOUNTER — Ambulatory Visit (INDEPENDENT_AMBULATORY_CARE_PROVIDER_SITE_OTHER): Payer: Managed Care, Other (non HMO) | Admitting: Orthopaedic Surgery

## 2019-02-28 ENCOUNTER — Encounter: Payer: Self-pay | Admitting: Orthopaedic Surgery

## 2019-02-28 ENCOUNTER — Other Ambulatory Visit: Payer: Self-pay

## 2019-02-28 VITALS — BP 133/94 | HR 103 | Temp 97.8°F | Ht 63.0 in | Wt 218.0 lb

## 2019-02-28 DIAGNOSIS — D219 Benign neoplasm of connective and other soft tissue, unspecified: Secondary | ICD-10-CM | POA: Diagnosis not present

## 2019-02-28 DIAGNOSIS — G8929 Other chronic pain: Secondary | ICD-10-CM

## 2019-02-28 DIAGNOSIS — M25562 Pain in left knee: Secondary | ICD-10-CM

## 2019-02-28 MED ORDER — NAPROXEN 500 MG PO TABS: 500.0000 mg | ORAL_TABLET | Freq: Two times a day (BID) | ORAL | 5 refills | Status: DC

## 2019-02-28 MED ORDER — HYDROCODONE-ACETAMINOPHEN 5-325 MG PO TABS: ORAL_TABLET | ORAL | 0 refills | Status: DC

## 2019-02-28 NOTE — Progress Notes (Signed)
Patient Melissa Livingston, female DOB:1996-09-12, 23 y.o. YKD:[id number]  Chief Complaint  Patient presents with  . Knee Pain    left  . Results    review MRI     HPI  Melissa Livingston is a 23 y.o. female who has left knee pain.  She had a MRI done and it showed: IMPRESSION: 1. Interval development of a small nonspecific left knee effusion. 2. 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 3. No discrete internal derangement of the knee.  I have explained the findings to her.  She will need to continue the Naprosyn and no surgery is needed.  She continues to have pain but has more good days than not.   Body mass index is 38.62 kg/m.  ROS  Review of Systems  HENT: Negative for congestion.   Respiratory: Negative for cough and shortness of breath.   Cardiovascular: Negative for chest pain and leg swelling.  Musculoskeletal: Positive for arthralgias, gait problem and joint swelling.  All other systems reviewed and are negative.   All other systems reviewed and are negative.  The following is a summary of the past history medically, past history surgically, known current medicines, social history and family history.  This information is gathered electronically by the computer from prior information and documentation.  I review this each visit and have found including this information at this point in the chart is beneficial and informative.    Past Medical History:  Diagnosis Date  . Anemia   . Anxiety   . Asthma    as child  . Endometritis 01/11/2015  . GERD (gastroesophageal reflux disease)   . History of ovarian cyst 01/28/2015  . IBS (irritable bowel syndrome)   . Nausea and vomiting 01/11/2015  . Ovarian cyst 01/14/2015  . Pelvic pain in female 01/11/2015  . Pregnancy induced hypertension    with pregnancy; no meds now.  . Pregnant 02/05/2016    Past Surgical History:  Procedure Laterality Date  . CESAREAN SECTION N/A 09/29/2014   Procedure: CESAREAN SECTION;  Surgeon: Jonnie Kind, MD;  Location: [place] [facility];  Service: Obstetrics;  Laterality: N/A;  . CESAREAN SECTION N/A 09/25/2016   Procedure: CESAREAN SECTION;  Surgeon: Jonnie Kind, MD;  Location: [place];  Service: Obstetrics;  Laterality: N/A;  . CHOLECYSTECTOMY N/A 03/07/2018   Procedure: LAPAROSCOPIC CHOLECYSTECTOMY;  Surgeon: Aviva Signs, MD;  Location: [facility];  Service: General;  Laterality: N/A;  . COLONOSCOPY WITH PROPOFOL N/A 08/02/2017   Dr. Gala Romney: moderate internal hemorhoids, distal 5 cm of TI also appeared normal  . ESOPHAGOGASTRODUODENOSCOPY (EGD) WITH ESOPHAGEAL DILATION N/A 07/19/2013   Dr. Rourk:normal appearing esophagus s/p dilation, normal D1 and D2, unremarkable path   . ESOPHAGOGASTRODUODENOSCOPY (EGD) WITH PROPOFOL N/A 08/02/2017   Normal esophagus, small hiatal hernia, normal duodenum  . none    . WISDOM TOOTH EXTRACTION      Family History  Problem Relation Age of Onset  . Multiple sclerosis Mother   . Heart disease Maternal Grandfather   . Heart disease Paternal Uncle   . Diabetes Paternal Uncle   . Hypertension Paternal Uncle   . Cancer Other        father's aunt had breast cancer  . Colon cancer Other        maternal great uncle, age greater than 61  . Crohn's disease Other        couple of family members on father's side of family  Social History Social History   Tobacco Use  . Smoking status: Never Smoker  . Smokeless tobacco: Never Used  Substance Use Topics  . Alcohol use: No    Alcohol/week: 0.0 standard drinks  . Drug use: No    Allergies  Allergen Reactions  . Adhesive [Tape] Other (See Comments)    Blisters   . Lorabid [Loracarbef] Hives, Swelling and Other (See Comments)    Mouth swelling Can take amoxil and augmentin   . Latex Itching and Rash  . Orange Fruit [Citrus] Rash    Current Outpatient Medications  Medication Sig Dispense Refill  . acetaminophen (TYLENOL) 325 MG tablet Take  650 mg by mouth every 6 (six) hours as needed for mild pain, moderate pain or headache.     . cyclobenzaprine (FLEXERIL) 10 MG tablet Take 1 tablet (10 mg total) by mouth 3 (three) times daily as needed for muscle spasms. 21 tablet 0  . dexlansoprazole (DEXILANT) 60 MG capsule Take 1 capsule (60 mg total) by mouth daily before breakfast. 30 capsule 3  . dicyclomine (BENTYL) 10 MG capsule Take 10 mg by mouth daily.    Marland Kitchen etonogestrel (NEXPLANON) 68 MG IMPL implant 1 each by Subdermal route once.    Marland Kitchen HYDROcodone-acetaminophen (NORCO/VICODIN) 5-325 MG tablet One tablet every four hours as needed for acute pain.  Limit of five days per [place] statue. 30 tablet 0  . ketorolac (TORADOL) 10 MG tablet Take one tablet by mouth every day as needed for severe headaches 16 tablet 0  . nortriptyline (PAMELOR) 25 MG capsule 3 qhs 90 capsule 5  . traMADol (ULTRAM) 50 MG tablet Take 1 tablet (50 mg total) by mouth every 6 (six) hours as needed. 10 tablet 0  . naproxen (NAPROSYN) 500 MG tablet Take 1 tablet (500 mg total) by mouth 2 (two) times daily with a meal. 60 tablet 5   No current facility-administered medications for this visit.      Physical Exam  Blood pressure (!) 133/94, pulse (!) 103, height 5\' 3"  (1.6 m), weight 218 lb (98.9 kg).  Constitutional: overall normal hygiene, normal nutrition, well developed, normal grooming, normal body habitus. Assistive device:none  Musculoskeletal: gait and station Limp left, muscle tone and strength are normal, no tremors or atrophy is present.  .  Neurological: coordination overall normal.  Deep tendon reflex/nerve stretch intact.  Sensation normal.  Cranial nerves II-XII intact.   Skin:   Normal overall no scars, lesions, ulcers or rashes. No psoriasis.  Psychiatric: Alert and oriented x 3.  Recent memory intact, remote memory unclear.  Normal mood and affect. Well groomed.  Good eye contact.  Cardiovascular: overall no swelling, no varicosities, no  edema bilaterally, normal temperatures of the legs and arms, no clubbing, cyanosis and good capillary refill.  Lymphatic: palpation is normal.  Left knee has tenderness, ROM 0 to 115, NV intact, slight limp left, slight effusion, knee is stable.  All other systems reviewed and are negative   The patient has been educated about the nature of the problem(s) and counseled on treatment options.  The patient appeared to understand what I have discussed and is in agreement with it.  Encounter Diagnoses  Name Primary?  . Chronic pain of left knee Yes  . Fibroxanthoma     PLAN Call if any problems.  Precautions discussed.  Continue current medications.   Return to clinic 2 months   I have reviewed the [place] web site prior  to prescribing narcotic medicine for this patient.   Electronically Signed Sanjuana Kava, MD 3/24/20208:42 AM

## 2019-03-02 ENCOUNTER — Telehealth: Payer: Self-pay | Admitting: *Deleted

## 2019-03-02 NOTE — Telephone Encounter (Signed)
Spoke with pt letting her know no visitors or children at tomorrow's appt. Also, pt hasn't come in contact with anyone that has been confirmed or suspected of having Covid-19 nor is she experiencing any symptoms herself. Pt plans on coming to appt. [place]

## 2019-03-03 ENCOUNTER — Other Ambulatory Visit: Payer: Self-pay

## 2019-03-03 ENCOUNTER — Encounter: Payer: Self-pay | Admitting: Adult Health

## 2019-03-03 ENCOUNTER — Ambulatory Visit (INDEPENDENT_AMBULATORY_CARE_PROVIDER_SITE_OTHER): Payer: Managed Care, Other (non HMO) | Admitting: Adult Health

## 2019-03-03 VITALS — BP 136/91 | HR 107 | Temp 98.6°F | Ht 63.0 in | Wt 213.5 lb

## 2019-03-03 DIAGNOSIS — Z975 Presence of (intrauterine) contraceptive device: Secondary | ICD-10-CM

## 2019-03-03 DIAGNOSIS — N898 Other specified noninflammatory disorders of vagina: Secondary | ICD-10-CM

## 2019-03-03 NOTE — Progress Notes (Addendum)
Patient ID: Melissa Livingston, female   DOB: Jan 04, 1996, 23 y.o.   MRN: [id number] History of Present Illness:  Melissa Livingston is a 23 year old black female, engaged,G2P2, in complaining of vaginal itching and odor, before and after period for last few months.She had nexplanon.She is moving from her apartment to live with boy friend's mom to save money to buy a house and feels a little stress. PCP is [facility].  Current Medications, Allergies, Past Medical History, Past Surgical History, Family History and Social History were reviewed in [employer] record.     Review of Systems: +vaginal itching  +vaginal odor Notices both before and after period Has nexplanon, due out in December  +stress    Physical Exam:BP (!) 136/91 (BP Location: Left Arm, Patient Position: Sitting, Cuff Size: Large)   Pulse (!) 107   Temp 98.6 F (37 C)   Ht 5\' 3"  (1.6 m)   Wt 213 lb 8 oz (96.8 kg)   LMP 03/02/2019   BMI 37.82 kg/m  General:  Well developed, well nourished, no acute distress Skin:  Warm and dry Lungs; Clear to auscultation bilaterally Cardiovascular: Regular rate and rhythm Abdomen:  Soft, non tender, no hepatosplenomegaly Pelvic:  External genitalia is normal in appearance, no lesions.  The vagina is normal in appearance.+period blood. Urethra has no lesions or masses. The cervix is bulbous.  Uterus is felt to be normal size, shape, and contour.  No adnexal masses or tenderness noted.Bladder is non tender, no masses felt. Nuswab obtained. Psych:  No mood changes, alert and cooperative,seems happy Fall risk is low. Examination chaperoned by Levy Pupa LPN.  Impression:   ICD-10-CM   1. Vaginal itching N89.8 NuSwab Vaginitis Plus (VG+)  2. Vaginal odor N89.8 NuSwab Vaginitis Plus (VG+)  3. Nexplanon in place Z97.5       Plan: Nuswab sent, will talk when results back Return in June for pap and physical

## 2019-03-10 ENCOUNTER — Telehealth: Payer: Self-pay | Admitting: Adult Health

## 2019-03-10 LAB — NUSWAB VAGINITIS PLUS (VG+)
Candida albicans, NAA: POSITIVE — AB
Candida glabrata, NAA: NEGATIVE
Chlamydia trachomatis, NAA: NEGATIVE
Neisseria gonorrhoeae, NAA: NEGATIVE
Trich vag by NAA: NEGATIVE

## 2019-03-10 MED ORDER — FLUCONAZOLE 150 MG PO TABS: ORAL_TABLET | ORAL | 1 refills | Status: DC

## 2019-03-10 NOTE — Telephone Encounter (Signed)
Pt aware that nuswab +yeast, will rx diflucan, all other tests negative

## 2019-03-29 ENCOUNTER — Other Ambulatory Visit: Payer: Self-pay

## 2019-03-29 ENCOUNTER — Ambulatory Visit (INDEPENDENT_AMBULATORY_CARE_PROVIDER_SITE_OTHER): Payer: Managed Care, Other (non HMO) | Admitting: Family Medicine

## 2019-03-29 DIAGNOSIS — N3000 Acute cystitis without hematuria: Secondary | ICD-10-CM

## 2019-03-29 MED ORDER — NITROFURANTOIN MONOHYD MACRO 100 MG PO CAPS: 100.0000 mg | ORAL_CAPSULE | Freq: Two times a day (BID) | ORAL | 0 refills | Status: DC

## 2019-03-29 NOTE — Progress Notes (Signed)
   Subjective:  Audio plus visualPatient ID: Melissa Livingston, female    DOB: 1996-06-06, 23 y.o.   MRN: [id number]  HPI Patient arrives with dysuria and urgency for 2 days. Patient states she has tried OTC cystex with no help.  Virtual Visit via Video Note  I connected with [place] on 03/29/19 at  1:40 PM EDT by a video enabled telemedicine application and verified that I am speaking with the correct person using two identifiers.   I discussed the limitations of evaluation and management by telemedicine and the availability of in person appointments. The patient expressed understanding and agreed to proceed.  History of Present Illness:    Observations/Objective:   Assessment and Plan:   Follow Up Instructions:    I discussed the assessment and treatment plan with the patient. The patient was provided an opportunity to ask questions and all were answered. The patient agreed with the plan and demonstrated an understanding of the instructions.   The patient was advised to call back or seek an in-person evaluation if the symptoms worsen or if the condition fails to improve as anticipated.  I provided 15 minutes of non-face-to-face time during this encounter.  Positive dysuria.  Positive increased frequency.  Some element of nocturia.  Patient has very mild nausea.  No vomiting.  No fever.  No chills.  No particular back pain no abdominal pain    Review of Systems     Objective:   Physical Exam   Virtual visit     Assessment & Plan:  Impression probable urinary tract infection.  Symptom care discussed.  Warning signs discussed.  Antibiotics prescribed.  Greater than 50% of this 15 minute face to face visit was spent in counseling and discussion and coordination of care regarding the above diagnosis/diagnosies

## 2019-04-11 ENCOUNTER — Other Ambulatory Visit: Payer: Self-pay

## 2019-04-11 ENCOUNTER — Ambulatory Visit (INDEPENDENT_AMBULATORY_CARE_PROVIDER_SITE_OTHER): Payer: Managed Care, Other (non HMO) | Admitting: Family Medicine

## 2019-04-11 DIAGNOSIS — M545 Low back pain, unspecified: Secondary | ICD-10-CM

## 2019-04-11 MED ORDER — CYCLOBENZAPRINE HCL 10 MG PO TABS: 10.0000 mg | ORAL_TABLET | Freq: Three times a day (TID) | ORAL | 2 refills | Status: DC | PRN

## 2019-04-11 MED ORDER — POTASSIUM CHLORIDE ER 10 MEQ PO TBCR: EXTENDED_RELEASE_TABLET | ORAL | 1 refills | Status: DC

## 2019-04-11 MED ORDER — FUROSEMIDE 20 MG PO TABS: ORAL_TABLET | ORAL | 1 refills | Status: DC
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
## 2019-04-11 NOTE — Addendum Note (Signed)
Addended by: Vicente Males on: 04/11/2019 04:31 PM   Modules accepted: Orders

## 2019-04-11 NOTE — Progress Notes (Signed)
PT referral placed.

## 2019-04-13 ENCOUNTER — Telehealth (HOSPITAL_COMMUNITY): Payer: Self-pay

## 2019-04-13 NOTE — Telephone Encounter (Signed)
Called and spoke to pt regarding PT referral that was received by our clinic. Pt felt comfortable to come in for an in-clinic visit and then telehealth if felt appropriate. Confirmed she had video capable devices and good Internet connection. Transferred pt to front office to get scheduled for in-clinic eval.  Geraldine Solar PT, DPT

## 2019-04-14 ENCOUNTER — Telehealth: Payer: Self-pay | Admitting: Family Medicine

## 2019-04-14 DIAGNOSIS — M545 Low back pain, unspecified: Secondary | ICD-10-CM

## 2019-04-14 NOTE — Telephone Encounter (Signed)
Please change lumbar spine x-ray to stat because of low back pain thank you

## 2019-04-14 NOTE — Telephone Encounter (Signed)
Mom calling because Melissa Livingston cant get a back xray until sometime in June unless Dr. Nicki Reaper changes it to a stat.  They don't want to wait till June because it is affecting her job.

## 2019-04-14 NOTE — Telephone Encounter (Signed)
Order changed to stat and pt notified.

## 2019-04-17 ENCOUNTER — Other Ambulatory Visit: Payer: Self-pay

## 2019-04-17 ENCOUNTER — Ambulatory Visit (HOSPITAL_COMMUNITY): Payer: Managed Care, Other (non HMO) | Attending: Family Medicine

## 2019-04-17 ENCOUNTER — Encounter (HOSPITAL_COMMUNITY): Payer: Self-pay

## 2019-04-17 DIAGNOSIS — M545 Low back pain: Secondary | ICD-10-CM | POA: Diagnosis present

## 2019-04-17 DIAGNOSIS — R262 Difficulty in walking, not elsewhere classified: Secondary | ICD-10-CM

## 2019-04-17 DIAGNOSIS — G8929 Other chronic pain: Secondary | ICD-10-CM | POA: Diagnosis present

## 2019-04-17 DIAGNOSIS — R29898 Other symptoms and signs involving the musculoskeletal system: Secondary | ICD-10-CM
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
## 2019-04-19 ENCOUNTER — Ambulatory Visit (HOSPITAL_COMMUNITY)
Admission: RE | Admit: 2019-04-19 | Discharge: 2019-04-19 | Disposition: A | Discharge status: Home / Self Care | Payer: Managed Care, Other (non HMO) | Source: Ambulatory Visit | Attending: Family Medicine | Admitting: Family Medicine

## 2019-04-19 ENCOUNTER — Other Ambulatory Visit: Payer: Self-pay

## 2019-04-19 ENCOUNTER — Ambulatory Visit (HOSPITAL_COMMUNITY): Payer: Managed Care, Other (non HMO)

## 2019-04-19 DIAGNOSIS — M545 Low back pain: Secondary | ICD-10-CM | POA: Insufficient documentation

## 2019-04-19 DIAGNOSIS — R262 Difficulty in walking, not elsewhere classified: Secondary | ICD-10-CM

## 2019-04-19 DIAGNOSIS — R29898 Other symptoms and signs involving the musculoskeletal system: Secondary | ICD-10-CM

## 2019-04-19 DIAGNOSIS — G8929 Other chronic pain: Secondary | ICD-10-CM

## 2019-04-19 NOTE — Therapy (Signed)
[place], [place], [address] Phone: [phone]   Fax:  [phone]  Physical Therapy Treatment  Patient Details  Name: Melissa Livingston MRN: [id number] Date of Birth: 12/17/1995 Referring Provider (PT): Kathyrn Drown, MD   Encounter Date: 04/19/2019  PT End of Session - 04/19/19 1442    Visit Number  2    Number of Visits  12    Date for PT Re-Evaluation  05/29/19   mini reassess 05/08/19   Authorization Type  [employer] Managed (Secondary: [place]; Tertiary: Medicaid [place]-FAMILY)    Authorization Time Period  04/17/19 to 05/28/19    Authorization - Visit Number  2    Authorization - Number of Visits  60   visit limit for Secondary insurance, [employer]   PT Start Time  1356    PT Stop Time  1437    PT Time Calculation (min)  41 min    Activity Tolerance  Patient tolerated treatment well    Behavior During Therapy  [facility] for tasks assessed/performed       Past Medical History:  Diagnosis Date  . Anemia   . Anxiety   . Asthma    as child  . Endometritis 01/11/2015  . GERD (gastroesophageal reflux disease)   . History of ovarian cyst 01/28/2015  . IBS (irritable bowel syndrome)   . Nausea and vomiting 01/11/2015  . Ovarian cyst 01/14/2015  . Pelvic pain in female 01/11/2015  . Pregnancy induced hypertension    with pregnancy; no meds now.  . Pregnant 02/05/2016    Past Surgical History:  Procedure Laterality Date  . CESAREAN SECTION N/A 09/29/2014   Procedure: CESAREAN SECTION;  Surgeon: Jonnie Kind, MD;  Location: [facility];  Service: Obstetrics;  Laterality: N/A;  . CESAREAN SECTION N/A 09/25/2016   Procedure: CESAREAN SECTION;  Surgeon: Jonnie Kind, MD;  Location: [place];  Service: Obstetrics;  Laterality: N/A;  . CHOLECYSTECTOMY N/A 03/07/2018   Procedure: LAPAROSCOPIC CHOLECYSTECTOMY;  Surgeon: Aviva Signs, MD;  Location: [facility];  Service: General;  Laterality: N/A;  . COLONOSCOPY  WITH PROPOFOL N/A 08/02/2017   Dr. Gala Romney: moderate internal hemorhoids, distal 5 cm of TI also appeared normal  . ESOPHAGOGASTRODUODENOSCOPY (EGD) WITH ESOPHAGEAL DILATION N/A 07/19/2013   Dr. Rourk:normal appearing esophagus s/p dilation, normal D1 and D2, unremarkable path   . ESOPHAGOGASTRODUODENOSCOPY (EGD) WITH PROPOFOL N/A 08/02/2017   Normal esophagus, small hiatal hernia, normal duodenum  . none    . WISDOM TOOTH EXTRACTION      There were no vitals filed for this visit.  Subjective Assessment - 04/19/19 1356    Subjective  Last night was the first time gave daughter a bath. She had a lot of pain up to 9/10 , it was more gradual as the bath went on.     Limitations  Lifting;Walking;House hold activities    How long can you sit comfortably?  position of comfort     How long can you stand comfortably?  no more than an hour    How long can you walk comfortably?  23mins    Patient Stated Goals  get back to the way I was, as little pain as possible    Currently in Pain?  No/denies        [facility] Adult PT Treatment/Exercise - 04/19/19 0001      Therapeutic Activites    Therapeutic Activities  Lifting    Lifting  10  reps lifting 5lb box from floor to waist and chest high shelves. Patient reported pain/aching in lower back.       Exercises   Exercises  Lumbar      Lumbar Exercises: Stretches   Prone on Elbows Stretch  10 seconds    Prone on Elbows Stretch Limitations  10 reps    Press Ups  10 reps;10 seconds      Lumbar Exercises: Standing   Other Standing Lumbar Exercises  Phase 1 deadlift form: 1x 10 reps with dowel on back to cue to maintain neutral spine. Cues for scapular retraction before standing back up.     Other Standing Lumbar Exercises  2x 10 reps bil LE hip hike on step      Manual Therapy   Manual Therapy  Joint mobilization    Manual therapy comments  Manual complete separate than rest of tx    Joint Mobilization  L3-5: Grade IV mobilization 30-45 second  oscillations to each segment, 2x 5 minutes   assessed fwd flex before and after       PT Education - 04/19/19 1441    Education Details  Reviewed evaluation and goals. Educated on treatment intervnetions. Updated HEP.    Person(s) Educated  Patient    Methods  Explanation;Handout;Demonstration    Comprehension  Verbalized understanding;Returned demonstration       PT Short Term Goals - 04/19/19 1448      PT SHORT TERM GOAL #1   Title  Pt will have reduced FOTO score by 10% or > to demo improved function and less self-perceived disability due to her LBP.     Time  3    Period  Weeks    Status  On-going    Target Date  05/08/19      PT SHORT TERM GOAL #2   Title  Pt will report being able to lift one of her daughters with 3/10 LBP and with form WFL in order to reduce risk for reinjury and maximize function at home.     Time  3    Period  Weeks    Status  On-going      PT SHORT TERM GOAL #3   Title  Pt will report being able to walk/participate in outdoor activities for 30 mins with 3/10 LBP in order to demo reduced pain and maximize return to PLOF.     Time  3    Period  Weeks    Status  On-going       PT Long Term Goals - 04/19/19 1449      PT LONG TERM GOAL #1   Title  Pt will have 1/2 grade improvement throughout her proximal hip mm in order to reduce LBP and maximize her lifting ability.    Time  6    Period  Weeks    Status  On-going      PT LONG TERM GOAL #2   Title  Pt will report being able to lift one of her daughters and perform all work duties with no LBP and form WFL in order to demo improved overall function and maximize return to PLOF.     Time  6    Period  Weeks    Status  On-going      PT LONG TERM GOAL #3   Title  Pt will report being able to walk/participate in outdoor activities for 1 hour or longer without LBP to demo return to PLOF and reduced overall pain.  Time  6    Period  Weeks    Status  On-going      PT LONG TERM GOAL #4   Title   Pt will be able to perform bil SLS for 30sec or > to demo improved core and functional hip strength to maximize her gait and outdoor activity.     Time  6    Period  Weeks    Status  On-going        Plan - 04/19/19 1442    Clinical Impression Statement  Start of session reviewed evaluation and goals, patient agreed they work towards her personal goals. Assessed lifting mechanics and patient demonstrates overall good hip and knee flexion with slight thoracic flexion at end range for lifting as well as early heal rise. Treatment initiated with assess/re-assess of lumbar flexion with PA's to lumbar spine as treatment and patient reported reduction in pain with forward flexion. Interventions continued for second round and patient educated on prone stretches to replicate intervention at home. Patient initiated dead-lift training to teach hip hinge using dowel rod and mirror for feedback. Patient required intermittent cues initially and improved throughout. She began hip strengthening today as well and HEP updated to include new exercises. Patient will continue to benefit from skilled PT interventions to address impairments and progress towards goals.    Personal Factors and Comorbidities  Time since onset of injury/illness/exacerbation;Comorbidity 3+    Comorbidities  see above    Examination-Activity Limitations  Bend;Carry;Lift;Squat    Examination-Participation Restrictions  Interpersonal Relationship;Community Activity;Yard Work    Stability/Clinical Decision Making  Stable/Uncomplicated    Rehab Potential  Good    PT Frequency  2x / week    PT Duration  6 weeks    PT Treatment/Interventions  ADLs/Self Care Home Management;Aquatic Therapy;Cryotherapy;Electrical Stimulation;Moist Heat;Traction;Ultrasound;Gait training;Stair training;Functional mobility training;Therapeutic activities;Therapeutic exercise;Balance training;Neuromuscular re-education;Patient/family education;Orthotic Fit/Training;Manual  techniques;Scar mobilization;Passive range of motion;Dry needling;Energy conservation;Taping;Spinal Manipulations;Joint Manipulations    PT Next Visit Plan  Assess lats and hip flexor involvement. Progress hip, core, strengthening, general mobility work, consider dry neelding for mm spasm, manual work to reduce soft tissue restrictions. Progress dead lift training.     PT Home Exercise Plan  eval: SKTC, child's pose fwd, R/l; prone on elbows and press up, hip hike    Consulted and Agree with Plan of Care  Patient       Patient will benefit from skilled therapeutic intervention in order to improve the following deficits and impairments:  Decreased balance, Decreased range of motion, Decreased strength, Difficulty walking, Hypomobility, Increased fascial restricitons, Increased muscle spasms, Impaired flexibility, Improper body mechanics, Postural dysfunction, Pain  Visit Diagnosis: Chronic bilateral low back pain without sciatica  Difficulty in walking, not elsewhere classified  Other symptoms and signs involving the musculoskeletal system     Problem List Patient Active Problem List   Diagnosis Date Noted  . Headache disorder 01/01/2019  . Chronic pain of both knees 08/25/2018  . Constipation 08/25/2018  . Chronic cholecystitis   . Nexplanon in place 01/03/2018  . Abdominal pain, chronic, generalized 12/13/2017  . Acute left-sided low back pain with left-sided sciatica 01/09/2017  . Morbid obesity ([place]) 01/09/2017  . Encounter for well woman exam with routine gynecological exam 11/03/2016  . Muscle spasm 11/03/2016  . S/P cesarean section 03/04/2016  . Abnormal Pap smear of cervix 03/04/2016  . Pyelonephritis, acute 06/13/2015  . 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 Hypokalemia 06/13/2015  . Normocytic anemia 06/13/2015  . Total  bilirubin, elevated 06/13/2015  . Hyponatremia 06/13/2015  . Hypomagnesemia 06/13/2015  . Dyspepsia 05/08/2015  . Dysphagia,  pharyngoesophageal phase 05/08/2015  . Hematochezia 05/08/2015  . Oliguria 04/23/2015  . History of gestational hypertension 04/23/2015  . Ovarian cyst 01/14/2015  . Pelvic pain in female 01/11/2015  . Nausea and vomiting 01/11/2015  . Endometritis 01/11/2015  . Rectal bleeding 07/18/2013  . GERD (gastroesophageal reflux disease) 07/18/2013  . Esophageal dysphagia 07/18/2013  . Abdominal pain, epigastric 07/18/2013  . Gastritis 07/03/2013  . Irritable bowel syndrome 07/03/2013  . Depression 07/03/2013  . Asthma 03/27/2013  . FRACTURE, TOE 02/22/2008    Kipp Brood, PT, DPT, [facility] Physical Therapist with [facility]  04/19/2019 2:49 PM    [place] [address] [place], [place], [address] Phone: [phone]   Fax:  [phone]  Name: Melissa Livingston MRN: [id number] Date of Birth: July 26, 1996

## 2019-04-19 NOTE — Patient Instructions (Signed)
Prone Press Up on Elbows reps: 10 sets: 1 hold: 10 seconds daily: 1  weekly: 7      Exercise image step 1   Exercise image step 2  Setup  Begin lying on your stomach, resting on your elbows low to the ground. Movement  Push up on your elbows, bending your back upward. Tip  Make sure to keep your hips in contact with the floor and maintain a gentle chin tuck throughout the exercise. Prone Press Up reps: 10 sets: 1 hold: 10 seconds daily: 1  weekly: 7      Exercise image step 1   Exercise image step 2  Setup  Begin lying on your stomach, with your hands by your shoulders resting flat on the ground. Movement  Push against the floor with your hands, bending your back upward. Tip  Make sure to keep your hips in contact with the floor and maintain a gentle chin tuck throughout the exercise. Disclaimer: This program provides exercises related to your condition that you can perform at home. As there is a risk of injury with any activity, use caution when performing exercises. If you experience any pain or discomfort, discontinue the exercises and contact your health care provider.  Login URL: [email or web address] . Access Code: [email or web address] . Date printed: 04/19/2019 Page 1  Hip Hiking on Step reps: 10 sets: 2-3 daily: 1 weekly: 7   Exercise image step 1   Exercise image step 2  Setup  Begin standing on a platform, balancing on one leg, with your other foot hanging off the edge.  Movement  Raise one hip to lift your hanging foot off the ground as high as you can, then lower it and repeat.  Tip  Make sure to keep your foot relaxed and use your hip to create the movement. Maintain an upright posture during the exercise.

## 2019-04-20 ENCOUNTER — Other Ambulatory Visit: Payer: Self-pay | Admitting: Family Medicine

## 2019-04-20 DIAGNOSIS — M545 Low back pain, unspecified: Secondary | ICD-10-CM

## 2019-04-24 ENCOUNTER — Ambulatory Visit (HOSPITAL_COMMUNITY): Payer: Managed Care, Other (non HMO)

## 2019-04-24 ENCOUNTER — Telehealth (HOSPITAL_COMMUNITY): Payer: Self-pay | Admitting: Family Medicine

## 2019-04-24 NOTE — Telephone Encounter (Signed)
04/24/19   left a message to reschedule today's appt... I called her back and we rescheduled for 5/21

## 2019-04-25 ENCOUNTER — Encounter: Payer: Self-pay | Admitting: Family Medicine

## 2019-04-25 MED ORDER — TOPIRAMATE 50 MG PO TABS: 50.0000 mg | ORAL_TABLET | Freq: Two times a day (BID) | ORAL | 2 refills | Status: DC

## 2019-04-25 NOTE — Telephone Encounter (Signed)
Based upon the patient's response Topamax 50 mg 1 twice daily Advised the patient to start off 1 each evening for the first 5 days then 1 twice daily Send in prescription for Topamax 50 mg 1 twice daily, #60, 2 refills I recommend a follow-up office visit in 3 to 4 weeks to assess how she is doing

## 2019-04-25 NOTE — Addendum Note (Signed)
Addended by: Karle Barr on: 04/25/2019 11:23 AM   Modules accepted: Orders

## 2019-04-25 NOTE — Telephone Encounter (Signed)
Patient is aware of all and medication was sent to [facility] at her request.

## 2019-04-26 ENCOUNTER — Encounter (HOSPITAL_COMMUNITY): Payer: Self-pay

## 2019-04-26 ENCOUNTER — Other Ambulatory Visit: Payer: Self-pay

## 2019-04-26 ENCOUNTER — Ambulatory Visit (HOSPITAL_COMMUNITY): Payer: Managed Care, Other (non HMO)

## 2019-04-26 DIAGNOSIS — M545 Low back pain, unspecified: Secondary | ICD-10-CM

## 2019-04-26 DIAGNOSIS — R29898 Other symptoms and signs involving the musculoskeletal system: Secondary | ICD-10-CM

## 2019-04-26 DIAGNOSIS — G8929 Other chronic pain: Secondary | ICD-10-CM

## 2019-04-26 DIAGNOSIS — R262 Difficulty in walking, not elsewhere classified: Secondary | ICD-10-CM

## 2019-04-26 NOTE — Therapy (Signed)
[place], [place], [address] Phone: [phone]   Fax:  [phone]  Physical Therapy Treatment  Patient Details  Name: Melissa Livingston MRN: [id number] Date of Birth: [birth year] Referring Provider (PT): Kathyrn Drown, MD   Encounter Date: 04/26/2019  PT End of Session - 04/26/19 1448    Visit Number  3    Number of Visits  12    Date for PT Re-Evaluation  05/29/19   mini reassess 05/08/19   Authorization Type  [employer] Managed (Secondary: [place]; Tertiary: Medicaid [place]-FAMILY)    Authorization Time Period  04/17/19 to 05/28/19    Authorization - Visit Number  3    Authorization - Number of Visits  60   visit limit for Secondary insurance, [employer]   PT Start Time  1444   pt late to therapy   PT Stop Time  1514    PT Time Calculation (min)  30 min    Activity Tolerance  Patient tolerated treatment well    Behavior During Therapy  [facility] for tasks assessed/performed       Past Medical History:  Diagnosis Date  . Anemia   . Anxiety   . Asthma    as child  . Endometritis 01/11/2015  . GERD (gastroesophageal reflux disease)   . History of ovarian cyst 01/28/2015  . IBS (irritable bowel syndrome)   . Nausea and vomiting 01/11/2015  . Ovarian cyst 01/14/2015  . Pelvic pain in female 01/11/2015  . Pregnancy induced hypertension    with pregnancy; no meds now.  . Pregnant 02/05/2016    Past Surgical History:  Procedure Laterality Date  . CESAREAN SECTION N/A 09/29/2014   Procedure: CESAREAN SECTION;  Surgeon: Jonnie Kind, MD;  Location: [facility];  Service: Obstetrics;  Laterality: N/A;  . CESAREAN SECTION N/A 09/25/2016   Procedure: CESAREAN SECTION;  Surgeon: Jonnie Kind, MD;  Location: [place];  Service: Obstetrics;  Laterality: N/A;  . CHOLECYSTECTOMY N/A 03/07/2018   Procedure: LAPAROSCOPIC CHOLECYSTECTOMY;  Surgeon: Aviva Signs, MD;  Location: [facility];  Service: General;  Laterality:  N/A;  . COLONOSCOPY WITH PROPOFOL N/A 08/02/2017   Dr. Gala Romney: moderate internal hemorhoids, distal 5 cm of TI also appeared normal  . ESOPHAGOGASTRODUODENOSCOPY (EGD) WITH ESOPHAGEAL DILATION N/A 07/19/2013   Dr. Rourk:normal appearing esophagus s/p dilation, normal D1 and D2, unremarkable path   . ESOPHAGOGASTRODUODENOSCOPY (EGD) WITH PROPOFOL N/A 08/02/2017   Normal esophagus, small hiatal hernia, normal duodenum  . none    . WISDOM TOOTH EXTRACTION      There were no vitals filed for this visit.  Subjective Assessment - 04/26/19 1446    Subjective  Patient reports she had a little bit of pain this morning in her back but it went away early on in the day. She states she can tell a difference in her pain since the last session and feels like her new stretches are really helping.    Limitations  Lifting;Walking;House hold activities    How long can you sit comfortably?  position of comfort     How long can you stand comfortably?  no more than an hour    How long can you walk comfortably?  87mins    Patient Stated Goals  get back to the way I was, as little pain as possible    Currently in Pain?  No/denies       [facility] Adult PT Treatment/Exercise - 04/26/19 0001  Exercises   Exercises  Lumbar      Lumbar Exercises: Stretches   Standing Extension  10 reps;5 seconds    Prone on Elbows Stretch  10 seconds    Prone on Elbows Stretch Limitations  10 reps    Press Ups  10 reps;10 seconds      Lumbar Exercises: Standing   Functional Squats  10 reps;Limitations    Functional Squats Limitations  2 sets, (2nd set with 3kg medball)     Row  15 reps;Strengthening;Both    Theraband Level (Row)  Level 2 (Red)    Other Standing Lumbar Exercises  Phase 1 deadlift form: 1x 10 reps with dowel on back to cue to maintain neutral spine. Cues for scapular retraction before standing back up. Phase 2 deadlift: 1x 10 reps with 5lb dowel to tap to 12" box.    Other Standing Lumbar Exercises  3x 15' RT  with Red TB at ankles for side stepping (minisquat)        PT Education - 04/26/19 1455    Education Details  Educatted on purpose of exercises throughout. updated HEP.    Person(s) Educated  Patient    Methods  Explanation;Demonstration;Handout    Comprehension  Verbalized understanding;Returned demonstration       PT Short Term Goals - 04/19/19 1448      PT SHORT TERM GOAL #1   Title  Pt will have reduced FOTO score by 10% or > to demo improved function and less self-perceived disability due to her LBP.     Time  3    Period  Weeks    Status  On-going    Target Date  05/08/19      PT SHORT TERM GOAL #2   Title  Pt will report being able to lift one of her daughters with 3/10 LBP and with form WFL in order to reduce risk for reinjury and maximize function at home.     Time  3    Period  Weeks    Status  On-going      PT SHORT TERM GOAL #3   Title  Pt will report being able to walk/participate in outdoor activities for 30 mins with 3/10 LBP in order to demo reduced pain and maximize return to PLOF.     Time  3    Period  Weeks    Status  On-going        PT Long Term Goals - 04/19/19 1449      PT LONG TERM GOAL #1   Title  Pt will have 1/2 grade improvement throughout her proximal hip mm in order to reduce LBP and maximize her lifting ability.    Time  6    Period  Weeks    Status  On-going      PT LONG TERM GOAL #2   Title  Pt will report being able to lift one of her daughters and perform all work duties with no LBP and form WFL in order to demo improved overall function and maximize return to PLOF.     Time  6    Period  Weeks    Status  On-going      PT LONG TERM GOAL #3   Title  Pt will report being able to walk/participate in outdoor activities for 1 hour or longer without LBP to demo return to PLOF and reduced overall pain.     Time  6    Period  Weeks  Status  On-going      PT LONG TERM GOAL #4   Title  Pt will be able to perform bil SLS for 30sec or  > to demo improved core and functional hip strength to maximize her gait and outdoor activity.     Time  6    Period  Weeks    Status  On-going         Plan - 04/26/19 1448    Clinical Impression Statement  Patient reported improved pain with lumbar extension stretches and session initiated with prone press up. She progressed functional strengthening with squats using 3kg ball today and performed deadlifts without weight for proper form initially. She was able to advance deadlifts with 5lbs dowel to tap 12' box, she reported tightness in her lower back following this. Pt instructed in standing lumbar extension stretch and she reported resolution of back pain after intervention. Initiated postural strengthening with therabands today and patient had good form requiring minimal cuing to reduced shoulder elevation and improve scapular retraction initially. She was provided updated HEP with hip strengthening exercise as she reported hip hike exercise was difficult to perform at home. Patient will continue to benefit from skilled PT interventions to address impairments and progress to goals.    Personal Factors and Comorbidities  Time since onset of injury/illness/exacerbation;Comorbidity 3+    Comorbidities  see above    Examination-Activity Limitations  Bend;Carry;Lift;Squat    Examination-Participation Restrictions  Interpersonal Relationship;Community Activity;Yard Work    Stability/Clinical Decision Making  Stable/Uncomplicated    Rehab Potential  Good    PT Frequency  2x / week    PT Duration  6 weeks    PT Treatment/Interventions  ADLs/Self Care Home Management;Aquatic Therapy;Cryotherapy;Electrical Stimulation;Moist Heat;Traction;Ultrasound;Gait training;Stair training;Functional mobility training;Therapeutic activities;Therapeutic exercise;Balance training;Neuromuscular re-education;Patient/family education;Orthotic Fit/Training;Manual techniques;Scar mobilization;Passive range of motion;Dry  needling;Energy conservation;Taping;Spinal Manipulations;Joint Manipulations    PT Next Visit Plan  Assess lats and hip flexor involvement. Progress hip, core, strengthening, general mobility work, consider dry neelding for mm spasm, manual work to reduce soft tissue restrictions. Progress dead lift training.     PT Home Exercise Plan  eval: SKTC, child's pose fwd, R/l; prone on elbows and press up, hip hike; 04/26/19- side step with tb    Consulted and Agree with Plan of Care  Patient       Patient will benefit from skilled therapeutic intervention in order to improve the following deficits and impairments:  Decreased balance, Decreased range of motion, Decreased strength, Difficulty walking, Hypomobility, Increased fascial restricitons, Increased muscle spasms, Impaired flexibility, Improper body mechanics, Postural dysfunction, Pain  Visit Diagnosis: Chronic bilateral low back pain without sciatica  Difficulty in walking, not elsewhere classified  Other symptoms and signs involving the musculoskeletal system     Problem List Patient Active Problem List   Diagnosis Date Noted  . Headache disorder 01/01/2019  . Chronic pain of both knees 08/25/2018  . Constipation 08/25/2018  . Chronic cholecystitis   . Nexplanon in place 01/03/2018  . Abdominal pain, chronic, generalized 12/13/2017  . Acute left-sided low back pain with left-sided sciatica 01/09/2017  . Morbid obesity ([place]) 01/09/2017  . Encounter for well woman exam with routine gynecological exam 11/03/2016  . Muscle spasm 11/03/2016  . S/P cesarean section 03/04/2016  . Abnormal Pap smear of cervix 03/04/2016  . Pyelonephritis, acute 06/13/2015  . 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 Hypokalemia 06/13/2015  . Normocytic anemia 06/13/2015  . Total bilirubin, elevated 06/13/2015  . Hyponatremia 06/13/2015  .  Hypomagnesemia 06/13/2015  . Dyspepsia 05/08/2015  . Dysphagia, pharyngoesophageal phase 05/08/2015  .  Hematochezia 05/08/2015  . Oliguria 04/23/2015  . History of gestational hypertension 04/23/2015  . Ovarian cyst 01/14/2015  . Pelvic pain in female 01/11/2015  . Nausea and vomiting 01/11/2015  . Endometritis 01/11/2015  . Rectal bleeding 07/18/2013  . GERD (gastroesophageal reflux disease) 07/18/2013  . Esophageal dysphagia 07/18/2013  . Abdominal pain, epigastric 07/18/2013  . Gastritis 07/03/2013  . Irritable bowel syndrome 07/03/2013  . Depression 07/03/2013  . Asthma 03/27/2013  . FRACTURE, TOE 02/22/2008    Kipp Brood, PT, DPT, [facility] Physical Therapist with [facility]  04/26/2019 3:20 PM    [facility] [facility] [address] [place], [place], [address] Phone: [phone]   Fax:  [phone]  Name: Melissa Livingston MRN: [id number] Date of Birth: [birth year]

## 2019-04-27 ENCOUNTER — Ambulatory Visit (HOSPITAL_COMMUNITY): Payer: Managed Care, Other (non HMO)

## 2019-04-27 ENCOUNTER — Telehealth (HOSPITAL_COMMUNITY): Payer: Self-pay

## 2019-04-27 NOTE — Telephone Encounter (Signed)
No show #1; PT spoke to pt about missing appointment and pt stated that she forgot about today's session. Reminded her of next scheduled appointment and she verbalized understanding.    Geraldine Solar PT, DPT

## 2019-05-02 ENCOUNTER — Ambulatory Visit: Payer: Self-pay | Admitting: Orthopaedic Surgery

## 2019-05-03 ENCOUNTER — Ambulatory Visit (HOSPITAL_COMMUNITY): Payer: Managed Care, Other (non HMO)

## 2019-05-03 ENCOUNTER — Encounter (HOSPITAL_COMMUNITY): Payer: Self-pay

## 2019-05-03 ENCOUNTER — Other Ambulatory Visit: Payer: Self-pay

## 2019-05-03 DIAGNOSIS — R262 Difficulty in walking, not elsewhere classified: Secondary | ICD-10-CM

## 2019-05-03 DIAGNOSIS — G8929 Other chronic pain: Secondary | ICD-10-CM

## 2019-05-03 DIAGNOSIS — M545 Low back pain: Secondary | ICD-10-CM | POA: Diagnosis not present

## 2019-05-03 DIAGNOSIS — R29898 Other symptoms and signs involving the musculoskeletal system: Secondary | ICD-10-CM

## 2019-05-03 NOTE — Therapy (Signed)
[place], [place], [address] Phone: [phone]   Fax:  [phone]  Physical Therapy Treatment  Patient Details  Name: Melissa Livingston MRN: [id number] Date of Birth: 10-15-1996 Referring Provider (PT): Kathyrn Drown, MD   Encounter Date: 05/03/2019  PT End of Session - 05/03/19 1315    Visit Number  4    Number of Visits  12    Date for PT Re-Evaluation  05/29/19   mini reassess 05/08/19   Authorization Type  [employer] Managed (Secondary: [place]; Tertiary: Medicaid -FAMILY)    Authorization Time Period  04/17/19 to 05/28/19    Authorization - Visit Number  4    Authorization - Number of Visits  60   visit limit for Secondary insurance, [employer]   PT Start Time  1305    PT Stop Time  1345    PT Time Calculation (min)  40 min    Activity Tolerance  Patient tolerated treatment well    Behavior During Therapy  [facility] for tasks assessed/performed       Past Medical History:  Diagnosis Date  . Anemia   . Anxiety   . Asthma    as child  . Endometritis 01/11/2015  . GERD (gastroesophageal reflux disease)   . History of ovarian cyst 01/28/2015  . IBS (irritable bowel syndrome)   . Nausea and vomiting 01/11/2015  . Ovarian cyst 01/14/2015  . Pelvic pain in female 01/11/2015  . Pregnancy induced hypertension    with pregnancy; no meds now.  . Pregnant 02/05/2016    Past Surgical History:  Procedure Laterality Date  . CESAREAN SECTION N/A 09/29/2014   Procedure: CESAREAN SECTION;  Surgeon: Jonnie Kind, MD;  Location: [facility];  Service: Obstetrics;  Laterality: N/A;  . CESAREAN SECTION N/A 09/25/2016   Procedure: CESAREAN SECTION;  Surgeon: Jonnie Kind, MD;  Location: [facility];  Service: Obstetrics;  Laterality: N/A;  . CHOLECYSTECTOMY N/A 03/07/2018   Procedure: LAPAROSCOPIC CHOLECYSTECTOMY;  Surgeon: Aviva Signs, MD;  Location: [facility];  Service: General;  Laterality: N/A;  . COLONOSCOPY  WITH PROPOFOL N/A 08/02/2017   Dr. Gala Romney: moderate internal hemorhoids, distal 5 cm of TI also appeared normal  . ESOPHAGOGASTRODUODENOSCOPY (EGD) WITH ESOPHAGEAL DILATION N/A 07/19/2013   Dr. Rourk:normal appearing esophagus s/p dilation, normal D1 and D2, unremarkable path   . ESOPHAGOGASTRODUODENOSCOPY (EGD) WITH PROPOFOL N/A 08/02/2017   Normal esophagus, small hiatal hernia, normal duodenum  . none    . WISDOM TOOTH EXTRACTION      There were no vitals filed for this visit.  Subjective Assessment - 05/03/19 1307    Subjective  Patient reports she feels good today and she reports it is getting easier to pick her 25 years old up but that her older child is still challenging to pick up. She denies pain at start of session.    Limitations  Lifting;Walking;House hold activities    How long can you sit comfortably?  position of comfort     How long can you stand comfortably?  no more than an hour    How long can you walk comfortably?  27mins    Patient Stated Goals  get back to the way I was, as little pain as possible    Currently in Pain?  No/denies        [facility] Adult PT Treatment/Exercise - 05/03/19 0001      Exercises   Exercises  Lumbar  Lumbar Exercises: Stretches   Press Ups  10 reps;10 seconds      Lumbar Exercises: Aerobic   UBE (Upper Arm Bike)  4 minutes retro, level 2, for postural exercise      Lumbar Exercises: Machines for Strengthening   Leg Press  BodyCraft Leg Press: 2x 12 reps 60lbs bil LE    Other Lumbar Machine Exercise  BodyCraft Lat Pull Down: 2x 15 reps, 30lbs      Lumbar Exercises: Standing   Row  15 reps;Strengthening;Both    Theraband Level (Row)  Level 2 (Red)    Shoulder Extension  Both;15 reps;Theraband    Theraband Level (Shoulder Extension)  Level 2 (Red)    Other Standing Lumbar Exercises  Phase 3 deadlift with BodyCraft: 2x 10 reps with 30lb tap to 12" box. and reverse bicep curl at standing    Other Standing Lumbar Exercises  3x 15' RT  with Red TB at ankles for side stepping (minisquat)        PT Education - 05/03/19 1314    Education Details  Educated on exercises throughout and plan to re-assess next session.    Person(s) Educated  Patient    Methods  Explanation    Comprehension  Verbalized understanding       PT Short Term Goals - 04/19/19 1448      PT SHORT TERM GOAL #1   Title  Pt will have reduced FOTO score by 10% or > to demo improved function and less self-perceived disability due to her LBP.     Time  3    Period  Weeks    Status  On-going    Target Date  05/08/19      PT SHORT TERM GOAL #2   Title  Pt will report being able to lift one of her daughters with 3/10 LBP and with form WFL in order to reduce risk for reinjury and maximize function at home.     Time  3    Period  Weeks    Status  On-going      PT SHORT TERM GOAL #3   Title  Pt will report being able to walk/participate in outdoor activities for 30 mins with 3/10 LBP in order to demo reduced pain and maximize return to PLOF.     Time  3    Period  Weeks    Status  On-going        PT Long Term Goals - 04/19/19 1449      PT LONG TERM GOAL #1   Title  Pt will have 1/2 grade improvement throughout her proximal hip mm in order to reduce LBP and maximize her lifting ability.    Time  6    Period  Weeks    Status  On-going      PT LONG TERM GOAL #2   Title  Pt will report being able to lift one of her daughters and perform all work duties with no LBP and form WFL in order to demo improved overall function and maximize return to PLOF.     Time  6    Period  Weeks    Status  On-going      PT LONG TERM GOAL #3   Title  Pt will report being able to walk/participate in outdoor activities for 1 hour or longer without LBP to demo return to PLOF and reduced overall pain.     Time  6    Period  Weeks  Status  On-going      PT LONG TERM GOAL #4   Title  Pt will be able to perform bil SLS for 30sec or > to demo improved core and  functional hip strength to maximize her gait and outdoor activity.     Time  6    Period  Weeks    Status  On-going       Plan - 05/03/19 1315    Clinical Impression Statement  Continued to progress lifting mechanics today and deadlift with increased load. Patient used cable machine and bar for deadlifts to 12" box and was able to increase load to 30lbs this session. She progressed postural strengthening as well to improve form with deadlift technique and performed leg press for strengthening. Pt reported LBP increased form 3 to 5/10 with exercises but was alleviated with prone press ups. Patient will continue to benefit from skilled PT interventions to address impairments and progress to goals.    Personal Factors and Comorbidities  Time since onset of injury/illness/exacerbation;Comorbidity 3+    Comorbidities  see above    Examination-Activity Limitations  Bend;Carry;Lift;Squat    Examination-Participation Restrictions  Interpersonal Relationship;Community Activity;Yard Work    Stability/Clinical Decision Making  Stable/Uncomplicated    Rehab Potential  Good    PT Frequency  2x / week    PT Duration  6 weeks    PT Treatment/Interventions  ADLs/Self Care Home Management;Aquatic Therapy;Cryotherapy;Electrical Stimulation;Moist Heat;Traction;Ultrasound;Gait training;Stair training;Functional mobility training;Therapeutic activities;Therapeutic exercise;Balance training;Neuromuscular re-education;Patient/family education;Orthotic Fit/Training;Manual techniques;Scar mobilization;Passive range of motion;Dry needling;Energy conservation;Taping;Spinal Manipulations;Joint Manipulations    PT Next Visit Plan  Re-assess. Progress hip, core, strengthening, general mobility work, consider dry neelding for mm spasm, manual work to reduce soft tissue restrictions. Progress dead lift training.     PT Home Exercise Plan  eval: SKTC, child's pose fwd, R/l; prone on elbows and press up, hip hike; 04/26/19- side  step with tb    Consulted and Agree with Plan of Care  Patient       Patient will benefit from skilled therapeutic intervention in order to improve the following deficits and impairments:  Decreased balance, Decreased range of motion, Decreased strength, Difficulty walking, Hypomobility, Increased fascial restricitons, Increased muscle spasms, Impaired flexibility, Improper body mechanics, Postural dysfunction, Pain  Visit Diagnosis: Chronic bilateral low back pain without sciatica  Difficulty in walking, not elsewhere classified  Other symptoms and signs involving the musculoskeletal system     Problem List Patient Active Problem List   Diagnosis Date Noted  . Headache disorder 01/01/2019  . Chronic pain of both knees 08/25/2018  . Constipation 08/25/2018  . Chronic cholecystitis   . Nexplanon in place 01/03/2018  . Abdominal pain, chronic, generalized 12/13/2017  . Acute left-sided low back pain with left-sided sciatica 01/09/2017  . Morbid obesity ([place]) 01/09/2017  . Encounter for well woman exam with routine gynecological exam 11/03/2016  . Muscle spasm 11/03/2016  . S/P cesarean section 03/04/2016  . Abnormal Pap smear of cervix 03/04/2016  . Pyelonephritis, acute 06/13/2015  . 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 Hypokalemia 06/13/2015  . Normocytic anemia 06/13/2015  . Total bilirubin, elevated 06/13/2015  . Hyponatremia 06/13/2015  . Hypomagnesemia 06/13/2015  . Dyspepsia 05/08/2015  . Dysphagia, pharyngoesophageal phase 05/08/2015  . Hematochezia 05/08/2015  . Oliguria 04/23/2015  . History of gestational hypertension 04/23/2015  . Ovarian cyst 01/14/2015  . Pelvic pain in female 01/11/2015  . Nausea and vomiting 01/11/2015  . Endometritis 01/11/2015  . Rectal bleeding 07/18/2013  .  GERD (gastroesophageal reflux disease) 07/18/2013  . Esophageal dysphagia 07/18/2013  . Abdominal pain, epigastric 07/18/2013  . Gastritis 07/03/2013  . Irritable  bowel syndrome 07/03/2013  . Depression 07/03/2013  . Asthma 03/27/2013  . FRACTURE, TOE 02/22/2008    Kipp Brood, PT, DPT, [facility] Physical Therapist with [facility]  05/03/2019 1:43 PM    [place] [address] [place], [place], [address] Phone: [phone]   Fax:  [phone]  Name: Melissa Livingston MRN: [id number] Date of Birth: Aug 12, 1996

## 2019-05-04 ENCOUNTER — Ambulatory Visit (INDEPENDENT_AMBULATORY_CARE_PROVIDER_SITE_OTHER): Payer: Managed Care, Other (non HMO) | Admitting: Orthopaedic Surgery

## 2019-05-04 ENCOUNTER — Encounter: Payer: Self-pay | Admitting: Orthopaedic Surgery

## 2019-05-04 VITALS — BP 123/83 | HR 89 | Temp 98.6°F | Ht 63.0 in | Wt 226.0 lb

## 2019-05-04 DIAGNOSIS — G8929 Other chronic pain: Secondary | ICD-10-CM

## 2019-05-04 DIAGNOSIS — M25562 Pain in left knee: Secondary | ICD-10-CM | POA: Diagnosis not present

## 2019-05-04 DIAGNOSIS — D219 Benign neoplasm of connective and other soft tissue, unspecified: Secondary | ICD-10-CM

## 2019-05-04 NOTE — Progress Notes (Signed)
Patient Melissa Livingston, female DOB:1996-03-18, 23 y.o. LGX:[id number]  Chief Complaint  Patient presents with  . Knee Pain    left    HPI  Melissa Livingston is a 23 y.o. female who has left knee pain. She has been to PT and is better.  She still has some stiffness but is better. She has less swelling. She is active and taking her medicine.   Body mass index is 40.03 kg/m.  The patient meets the AMA guidelines for Morbid (severe) obesity with a BMI > 40.0 and I have recommended weight loss.   ROS  Review of Systems  HENT: Negative for congestion.   Respiratory: Negative for cough and shortness of breath.   Cardiovascular: Negative for chest pain and leg swelling.  Musculoskeletal: Positive for arthralgias, gait problem and joint swelling.  All other systems reviewed and are negative.   All other systems reviewed and are negative.  The following is a summary of the past history medically, past history surgically, known current medicines, social history and family history.  This information is gathered electronically by the computer from prior information and documentation.  I review this each visit and have found including this information at this point in the chart is beneficial and informative.    Past Medical History:  Diagnosis Date  . Anemia   . Anxiety   . Asthma    as child  . Endometritis 01/11/2015  . GERD (gastroesophageal reflux disease)   . History of ovarian cyst 01/28/2015  . IBS (irritable bowel syndrome)   . Nausea and vomiting 01/11/2015  . Ovarian cyst 01/14/2015  . Pelvic pain in female 01/11/2015  . Pregnancy induced hypertension    with pregnancy; no meds now.  . Pregnant 02/05/2016    Past Surgical History:  Procedure Laterality Date  . CESAREAN SECTION N/A 09/29/2014   Procedure: CESAREAN SECTION;  Surgeon: Jonnie Kind, MD;  Location: [facility];  Service: Obstetrics;  Laterality: N/A;  . CESAREAN SECTION N/A 09/25/2016   Procedure: CESAREAN SECTION;   Surgeon: Jonnie Kind, MD;  Location: [facility];  Service: Obstetrics;  Laterality: N/A;  . CHOLECYSTECTOMY N/A 03/07/2018   Procedure: LAPAROSCOPIC CHOLECYSTECTOMY;  Surgeon: Aviva Signs, MD;  Location: [facility];  Service: General;  Laterality: N/A;  . COLONOSCOPY WITH PROPOFOL N/A 08/02/2017   Dr. Gala Romney: moderate internal hemorhoids, distal 5 cm of TI also appeared normal  . ESOPHAGOGASTRODUODENOSCOPY (EGD) WITH ESOPHAGEAL DILATION N/A 07/19/2013   Dr. Rourk:normal appearing esophagus s/p dilation, normal D1 and D2, unremarkable path   . ESOPHAGOGASTRODUODENOSCOPY (EGD) WITH PROPOFOL N/A 08/02/2017   Normal esophagus, small hiatal hernia, normal duodenum  . none    . WISDOM TOOTH EXTRACTION      Family History  Problem Relation Age of Onset  . Multiple sclerosis Mother   . Heart disease Maternal Grandfather   . Heart disease Paternal Uncle   . Diabetes Paternal Uncle   . Hypertension Paternal Uncle   . Cancer Other        father's aunt had breast cancer  . Colon cancer Other        maternal great uncle, age greater than 73  . Crohn's disease Other        couple of family members on father's side of family    Social History Social History   Tobacco Use  . Smoking status: Never Smoker  . Smokeless tobacco: Never Used  Substance Use Topics  . Alcohol use: No  Alcohol/week: 0.0 standard drinks  . Drug use: No    Allergies  Allergen Reactions  . Adhesive [Tape] Other (See Comments)    Blisters   . Lorabid [Loracarbef] Hives, Swelling and Other (See Comments)    Mouth swelling Can take amoxil and augmentin   . Latex Itching and Rash  . Orange Fruit [Citrus] Rash    Current Outpatient Medications  Medication Sig Dispense Refill  . acetaminophen (TYLENOL) 325 MG tablet Take 650 mg by mouth every 6 (six) hours as needed for mild pain, moderate pain or headache.     . cyclobenzaprine (FLEXERIL) 10 MG tablet Take 1 tablet (10 mg total) by mouth 3 (three) times  daily as needed for muscle spasms. 21 tablet 2  . dicyclomine (BENTYL) 10 MG capsule Take 10 mg by mouth as needed.     . etonogestrel (NEXPLANON) 68 MG IMPL implant 1 each by Subdermal route once.    . fluconazole (DIFLUCAN) 150 MG tablet Take 1 now and repeat 1 in 3 days 2 tablet 1  . furosemide (LASIX) 20 MG tablet Take one tablet by mouth every morning prn edema 30 tablet 1  . HYDROcodone-acetaminophen (NORCO/VICODIN) 5-325 MG tablet One tablet every four hours as needed for acute pain.  Limit of five days per [place]. 30 tablet 0  . ketorolac (TORADOL) 10 MG tablet Take one tablet by mouth every day as needed for severe headaches 16 tablet 0  . naproxen (NAPROSYN) 500 MG tablet Take 1 tablet (500 mg total) by mouth 2 (two) times daily with a meal. 60 tablet 5  . nitrofurantoin, macrocrystal-monohydrate, (MACROBID) 100 MG capsule Take 1 capsule (100 mg total) by mouth 2 (two) times daily. 10 capsule 0  . nortriptyline (PAMELOR) 25 MG capsule 3 qhs 90 capsule 5  . potassium chloride (K-DUR) 10 MEQ tablet Take one tablet by mouth every morning prn (take with lasix) 30 tablet 1  . topiramate (TOPAMAX) 50 MG tablet Take 1 tablet (50 mg total) by mouth 2 (two) times daily. 60 tablet 2   No current facility-administered medications for this visit.      Physical Exam  Blood pressure 123/83, pulse 89, height 5\' 3"  (1.6 m), weight 226 lb (102.5 kg).  Constitutional: overall normal hygiene, normal nutrition, well developed, normal grooming, normal body habitus. Assistive device:none  Musculoskeletal: gait and station Limp none, muscle tone and strength are normal, no tremors or atrophy is present.  .  Neurological: coordination overall normal.  Deep tendon reflex/nerve stretch intact.  Sensation normal.  Cranial nerves II-XII intact.   Skin:   Normal overall no scars, lesions, ulcers or rashes. No psoriasis.  Psychiatric: Alert and oriented x 3.  Recent memory intact, remote memory  unclear.  Normal mood and affect. Well groomed.  Good eye contact.  Cardiovascular: overall no swelling, no varicosities, no edema bilaterally, normal temperatures of the legs and arms, no clubbing, cyanosis and good capillary refill.  Lymphatic: palpation is normal.  Left knee has no effusion today, ROM 0 to 115, knee is stable.  NV intact.  All other systems reviewed and are negative   The patient has been educated about the nature of the problem(s) and counseled on treatment options.  The patient appeared to understand what I have discussed and is in agreement with it.  Encounter Diagnoses  Name Primary?  . Chronic pain of left knee Yes  . Fibroxanthoma     PLAN Call if any problems.  Precautions discussed.  Continue current medications.   Return to clinic 1 month   Electronically Signed Sanjuana Kava, MD 5/28/20209:57 AM

## 2019-05-05 ENCOUNTER — Ambulatory Visit (HOSPITAL_COMMUNITY): Payer: Managed Care, Other (non HMO)

## 2019-05-08 ENCOUNTER — Telehealth (HOSPITAL_COMMUNITY): Payer: Self-pay

## 2019-05-08 NOTE — Telephone Encounter (Signed)
I attempted to call [name] regarding her missed appointment last Friday, 05/05/2019 and there was no answer at her home/mobile number. I was unable to leave a voice message as the mailbox was full. This appointment was Melissa Livingston's last scheduled appointment. I will call at later date/time to attempt to contact her again about rescheduling.   Kipp Brood, PT, DPT, [facility] Physical Therapist with [facility]  05/08/2019 9:57 AM

## 2019-05-17 ENCOUNTER — Encounter: Payer: Self-pay | Admitting: Family Medicine

## 2019-06-05 ENCOUNTER — Other Ambulatory Visit: Payer: Self-pay

## 2019-06-05 ENCOUNTER — Ambulatory Visit (INDEPENDENT_AMBULATORY_CARE_PROVIDER_SITE_OTHER): Payer: Managed Care, Other (non HMO) | Admitting: Adult Health

## 2019-06-05 ENCOUNTER — Other Ambulatory Visit (HOSPITAL_COMMUNITY)
Admission: RE | Admit: 2019-06-05 | Discharge: 2019-06-05 | Disposition: A | Discharge status: Home / Self Care | Payer: Managed Care, Other (non HMO) | Source: Ambulatory Visit | Attending: Adult Health | Admitting: Adult Health

## 2019-06-05 ENCOUNTER — Encounter: Payer: Self-pay | Admitting: Adult Health

## 2019-06-05 VITALS — BP 130/77 | HR 89 | Ht 63.5 in | Wt 222.5 lb

## 2019-06-05 DIAGNOSIS — Z113 Encounter for screening for infections with a predominantly sexual mode of transmission: Secondary | ICD-10-CM | POA: Insufficient documentation

## 2019-06-05 DIAGNOSIS — Z01419 Encounter for gynecological examination (general) (routine) without abnormal findings: Secondary | ICD-10-CM

## 2019-06-05 DIAGNOSIS — Z3009 Encounter for other general counseling and advice on contraception: Secondary | ICD-10-CM | POA: Diagnosis present

## 2019-06-05 DIAGNOSIS — Z975 Presence of (intrauterine) contraceptive device: Secondary | ICD-10-CM

## 2019-06-05 NOTE — Progress Notes (Signed)
Patient ID: Melissa Livingston, female   DOB: 02-17-1996, 23 y.o.   MRN: [id number] History of Present Illness: Melissa Livingston is a 23 year old  Black female, single, G2P2 in for a well woman gyn exam and pap. 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  PCP is [facility].    Current Medications, Allergies, Past Medical History, Past Surgical History, Family History and Social History were reviewed in [employer] record.     Review of Systems: Patient denies any hearing loss, fatigue, blurred vision, shortness of breath, chest pain, abdominal pain, problems with bowel movements, urination, or intercourse(has occasional pain). No joint pain or mood swings. Has some BTB with nexplanon. Has had migraines recently on Topamax now.     Physical Exam:BP 130/77 (BP Location: Left Arm, Patient Position: Sitting, Cuff Size: Large)   Pulse 89   Ht 5' 3.5" (1.613 m)   Wt 222 lb 8 oz (100.9 kg)   LMP 05/19/2019   BMI 38.80 kg/m  General:  Well developed, well nourished, no acute distress Skin:  Warm and dry Neck:  Midline trachea, normal thyroid, good ROM, no lymphadenopathy Lungs; Clear to auscultation bilaterally Breast:  No dominant palpable mass, retraction, or nipple discharge Cardiovascular: Regular rate and rhythm Abdomen:  Soft, non tender, no hepatosplenomegaly Pelvic:  External genitalia is normal in appearance, no lesions.  The vagina is normal in appearance. Urethra has no lesions or masses. The cervix is bulbous.Pap with GC/CHL and reflex HPV performed.  Uterus is felt to be normal size, shape, and contour.  No adnexal masses or tenderness noted.Bladder is non tender, no masses felt. Extremities/musculoskeletal:  No swelling or varicosities noted, no clubbing or cyanosis Psych:  No mood changes, alert and cooperative,seems happy Fall risk is low. PHQ 2 score 1. Examination chaperoned by Levy Pupa LPN.  Impression:  1. Encounter for gynecological examination with Papanicolaou smear of  cervix   2. Family planning   3. Screen for STD (sexually transmitted disease)   4. Nexplanon in place      Plan: Pap with GC/CHL and reflex HPV sent Check HIV and RPR Physical in 1 year Pap in 3 years if normal

## 2019-06-06 ENCOUNTER — Encounter: Payer: Self-pay | Admitting: Orthopaedic Surgery

## 2019-06-06 ENCOUNTER — Ambulatory Visit: Payer: Managed Care, Other (non HMO) | Admitting: Orthopaedic Surgery

## 2019-06-06 LAB — RPR: RPR Ser Ql: NONREACTIVE

## 2019-06-06 LAB — HIV ANTIBODY (ROUTINE TESTING W REFLEX): HIV Screen 4th Generation wRfx: NONREACTIVE

## 2019-06-07 LAB — CYTOLOGY - PAP
Chlamydia: NEGATIVE
Diagnosis: NEGATIVE
Neisseria Gonorrhea: NEGATIVE

## 2019-06-22 ENCOUNTER — Other Ambulatory Visit: Payer: Self-pay

## 2019-06-26 ENCOUNTER — Other Ambulatory Visit: Payer: Self-pay

## 2019-06-26 ENCOUNTER — Ambulatory Visit (INDEPENDENT_AMBULATORY_CARE_PROVIDER_SITE_OTHER): Payer: Managed Care, Other (non HMO) | Admitting: Family Medicine

## 2019-06-26 DIAGNOSIS — R51 Headache: Secondary | ICD-10-CM

## 2019-06-26 NOTE — Progress Notes (Signed)
   Subjective:    Patient ID: Melissa Livingston, female    DOB: May 09, 1996, 23 y.o.   MRN: [id number]  HPI Should be noted that multiple attempts were made to connect with the patient via phone and video unsuccessful.  Therefore this visit is closed without charge   Patient calls for a follow up on her migraines. Patient states she has been having some problems with migraines lately and feels like it is related to wearing a mask and lack of oxygen.  Virtual Visit via Video Note  I connected with Melissa Livingston on 06/26/19 at 10:00 AM EDT by a video enabled telemedicine application and verified that I am speaking with the correct person using two identifiers.  Location: Patient: home Provider: office   I discussed the limitations of evaluation and management by telemedicine and the availability of in person appointments. The patient expressed understanding and agreed to proceed.  History of Present Illness:    Observations/Objective:   Assessment and Plan:   Follow Up Instructions:    I discussed the assessment and treatment plan with the patient. The patient was provided an opportunity to ask questions and all were answered. The patient agreed with the plan and demonstrated an understanding of the instructions.   The patient was advised to call back or seek an in-person evaluation if the symptoms worsen or if the condition fails to improve as anticipated.  I provided 0 minutes of non-face-to-face time during this encounter.           Objective:           Assessment & Plan:

## 2019-07-06 ENCOUNTER — Encounter: Payer: Self-pay | Admitting: Family Medicine

## 2019-07-06 ENCOUNTER — Ambulatory Visit (INDEPENDENT_AMBULATORY_CARE_PROVIDER_SITE_OTHER): Payer: Managed Care, Other (non HMO) | Admitting: Family Medicine

## 2019-07-06 ENCOUNTER — Other Ambulatory Visit: Payer: Self-pay

## 2019-07-06 DIAGNOSIS — R519 Headache, unspecified: Secondary | ICD-10-CM

## 2019-07-06 DIAGNOSIS — R51 Headache: Secondary | ICD-10-CM

## 2019-07-06 DIAGNOSIS — G43709 Chronic migraine without aura, not intractable, without status migrainosus: Secondary | ICD-10-CM | POA: Diagnosis not present

## 2019-07-06 DIAGNOSIS — IMO0002 Reserved for concepts with insufficient information to code with codable children: Secondary | ICD-10-CM

## 2019-07-06 MED ORDER — TOPIRAMATE 50 MG PO TABS: ORAL_TABLET | ORAL | 5 refills | Status: DC

## 2019-07-06 MED ORDER — SUMATRIPTAN SUCCINATE 50 MG PO TABS: ORAL_TABLET | ORAL | 3 refills | Status: DC

## 2019-07-06 NOTE — Progress Notes (Signed)
   Subjective:    Patient ID: Melissa Livingston, female    DOB: 12-31-1995, 23 y.o.   MRN: [id number]  HPI Pt having 5 month follow up. Pt states that her headaches are worse when she is having to wear the face mask. Headaches are also worse on her cycle. Pt states the Topamax is helping her. Patient relates Topamax is helping but she still gets 5 or 6 headaches per month sometimes worse sometimes last she does not-has not tried Imitrex in the past she is willing to try this Her stress levels are somewhat high because of coronavirus PMH migraines Virtual Visit via Video Note  I connected with [name] on 07/06/19 at 11:00 AM EDT by a video enabled telemedicine application and verified that I am speaking with the correct person using two identifiers.  Location: Patient: home Provider: office   I discussed the limitations of evaluation and management by telemedicine and the availability of in person appointments. The patient expressed understanding and agreed to proceed.  History of Present Illness:    Observations/Objective:   Assessment and Plan:   Follow Up Instructions:    I discussed the assessment and treatment plan with the patient. The patient was provided an opportunity to ask questions and all were answered. The patient agreed with the plan and demonstrated an understanding of the instructions.   The patient was advised to call back or seek an in-person evaluation if the symptoms worsen or if the condition fails to improve as anticipated.  I provided 15 minutes of non-face-to-face time during this encounter.   Vicente Males, LPN    Review of Systems  Constitutional: Negative for activity change and appetite change.  HENT: Negative for congestion and rhinorrhea.   Respiratory: Negative for cough and shortness of breath.   Cardiovascular: Negative for chest pain and leg swelling.  Gastrointestinal: Negative for abdominal pain, nausea and vomiting.  Skin: Negative for  color change.  Neurological: Positive for headaches. Negative for dizziness and weakness.  Psychiatric/Behavioral: Negative for agitation and confusion.       Objective:   Physical Exam  Today's visit was via telephone Physical exam was not possible for this visit   15 minutes was spent with patient today discussing healthcare issues which they came.  More than 50% of this visit-total duration of visit-was spent in counseling and coordination of care.  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

## 2019-07-27 ENCOUNTER — Encounter (HOSPITAL_COMMUNITY): Payer: Self-pay

## 2019-07-27 NOTE — Therapy (Signed)
[place], [place], [address] Phone: [phone]   Fax:  [phone]  Patient Details  Name: Melissa Livingston MRN: [id number] Date of Birth: August 03, 1996 Referring Provider:  No ref. provider found  Encounter Date: 07/27/2019  PHYSICAL THERAPY DISCHARGE SUMMARY  Visits from Start of Care: 4  Current functional level related to goals / functional outcomes: Patient missed last appointment scheduled for 05/08/19. A phone call was made to attempt to contact pt and reschedule however she did not answer and voice message was left. Pt did not return message and will be discharged from current episode of care.    Remaining deficits: See last treatment not for status of goals at last session. No formal re-assessment was performed.   Education / Equipment: Educated on HEP throughout bout of therapy  Plan: Patient agrees to discharge.  Patient goals were not met. Patient is being discharged due to not returning since the last visit.  ?????     Kipp Brood, PT, DPT, [facility] Physical Therapist with [facility]  07/27/2019 5:43 PM   [place] [address] [place], [place], [address] Phone: [phone]   Fax:  [phone]

## 2019-08-01 ENCOUNTER — Ambulatory Visit: Payer: Managed Care, Other (non HMO) | Admitting: Orthopaedic Surgery

## 2019-08-10 ENCOUNTER — Ambulatory Visit: Payer: Managed Care, Other (non HMO) | Admitting: Orthopaedic Surgery

## 2019-08-11 ENCOUNTER — Encounter: Payer: Self-pay | Admitting: Orthopaedic Surgery

## 2019-08-18 ENCOUNTER — Other Ambulatory Visit: Payer: Self-pay

## 2019-08-18 ENCOUNTER — Ambulatory Visit (INDEPENDENT_AMBULATORY_CARE_PROVIDER_SITE_OTHER): Payer: Managed Care, Other (non HMO) | Admitting: Nurse Practitioner

## 2019-08-18 DIAGNOSIS — N644 Mastodynia: Secondary | ICD-10-CM | POA: Diagnosis not present

## 2019-08-18 MED ORDER — DOXYCYCLINE HYCLATE 100 MG PO TABS: 100.0000 mg | ORAL_TABLET | Freq: Two times a day (BID) | ORAL | 0 refills | Status: DC

## 2019-08-18 NOTE — Progress Notes (Signed)
Patient ID: Melissa Livingston, female    DOB: Jun 30, 1996, 23y.o.   MRN: [id number]  Patient presents with breast pain, which started 1.5 weeks ago. States it is a sharp pain in the left breast that originates from breast and radiates down nipple. Left breast is tender. Has trouble sleeping related to breast pain. Accompanied with burning and slight swelling of the left beast. Denies redness, rash, lump, discharge, or mass. No issues in the right breast. Denies fever or chill. Patient is not breast-feeding.  LMP two weeks ago, has Nexplanon for over two years now. Paternal great aunt diagnosed with breast cancer, no other family history. Has taken ibuprofen and placed heating pad on breast with some relief.    Virtual Visit via Video Note  I connected with [place] on 08/18/19 at  1:40 PM EDT by a video enabled telemedicine application and verified that I am speaking with the correct person using two identifiers.  Location: Patient: Work Provider: Office   I discussed the limitations of evaluation and management by telemedicine and the availability of in person appointments. The patient expressed understanding and agreed to proceed.  Observations/Objective: General: Patient alert and oriented. Well-appearing female. Currently at work. Format  Patient present at home Provider present at office Consent for interaction obtained Coronavirus outbreak made virtual visit necessary   Assessment and Plan: Meds ordered this encounter  Medications  . doxycycline (VIBRA-TABS) 100 MG tablet    Sig: Take 1 tablet (100 mg total) by mouth 2 (two) times daily.    Dispense:  14 tablet    Refill:  0    Order Specific Question:   Supervising Provider    Answer:   Sallee Lange A [[id number]]    -Continue warm compresses and ibuprofen for breast pain management. Etiology likely infectious/inflammatory. Take medication as prescribed. Discussed warning precautions. If masses/lumps, fever, shortness of breath,  worsening breast pain unrelieved by medication, please seek care. Discussed if symptoms unimproved by Monday, call office and schedule a face to face visit.   Follow Up Instructions: Patient advised to call office if no improvement by Monday. If symptoms worsening during the weekend, please see urgent care/ED.   I provided 15 minutes of non-face-to-face time during this encounter.

## 2019-08-18 NOTE — Progress Notes (Signed)
   Subjective:    Patient ID: Melissa Livingston, female    DOB: September 26, 1996, 23 y.o.   MRN: [id number]  HPIleft breast pain off and on for about one and a half weeks. Last two days the pain is constant. Tried ibuprofen and it does help.   Virtual Visit via Video Note  I connected with [place] on 08/18/19 at  1:40 PM EDT by a video enabled telemedicine application and verified that I am speaking with the correct person using two identifiers.  Location: Patient: home Provider: office   I discussed the limitations of evaluation and management by telemedicine and the availability of in person appointments. The patient expressed understanding and agreed to proceed.  History of Present Illness:    Observations/Objective:   Assessment and Plan:   Follow Up Instructions:    I discussed the assessment and treatment plan with the patient. The patient was provided an opportunity to ask questions and all were answered. The patient agreed with the plan and demonstrated an understanding of the instructions.   The patient was advised to call back or seek an in-person evaluation if the symptoms worsen or if the condition fails to improve as anticipated.  I provided 15 minutes of non-face-to-face time during this encounter.        Review of Systems     Objective:   Physical Exam        Assessment & Plan:

## 2019-08-19 ENCOUNTER — Encounter: Payer: Self-pay | Admitting: Nurse Practitioner

## 2019-08-25 ENCOUNTER — Ambulatory Visit (INDEPENDENT_AMBULATORY_CARE_PROVIDER_SITE_OTHER): Payer: Managed Care, Other (non HMO) | Admitting: Nurse Practitioner

## 2019-08-25 ENCOUNTER — Other Ambulatory Visit: Payer: Self-pay

## 2019-08-25 VITALS — BP 124/80 | Temp 98.0°F | Ht 63.5 in | Wt 220.0 lb

## 2019-08-25 DIAGNOSIS — K582 Mixed irritable bowel syndrome: Secondary | ICD-10-CM | POA: Diagnosis not present

## 2019-08-25 DIAGNOSIS — K219 Gastro-esophageal reflux disease without esophagitis: Secondary | ICD-10-CM

## 2019-08-25 DIAGNOSIS — N644 Mastodynia: Secondary | ICD-10-CM | POA: Diagnosis not present

## 2019-08-25 MED ORDER — PREDNISONE 20 MG PO TABS: ORAL_TABLET | ORAL | 0 refills | Status: DC

## 2019-08-25 MED ORDER — HYDROCODONE-ACETAMINOPHEN 5-325 MG PO TABS: 1.0000 | ORAL_TABLET | ORAL | 0 refills | Status: DC | PRN

## 2019-08-25 MED ORDER — DICYCLOMINE HCL 10 MG PO CAPS: 10.0000 mg | ORAL_CAPSULE | Freq: Three times a day (TID) | ORAL | 2 refills | Status: DC

## 2019-08-25 MED ORDER — PANTOPRAZOLE SODIUM 40 MG PO TBEC: 40.0000 mg | DELAYED_RELEASE_TABLET | Freq: Every day | ORAL | 1 refills | Status: DC

## 2019-08-25 NOTE — Progress Notes (Signed)
Subjective:    Patient ID: Melissa Livingston, female    DOB: 05-29-1996, 23 y.o.   MRN: [id number]  HPIFollow up on left breast pain. Pain started about 2 and a half weeks ago. Was prescribed doxycycline and pt had to stop after 3 days due to GI upset. No improvement of her symptoms while on antibiotic Patient is complaining of breast tenderness, itchiness, and burning sensation of the left breast. States she feels a sharp pain originating in the nipple and spreading throughout the breast tissue. Has been alternating taking ibuprofen and tylenol. States the ibuprofen has been causing stomach upset. States pain is worse at night when sleeping. Pt has tried warm compresses to the left breast with improvement of the tenderness, but does not help with pain. 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 Pt has tenderness in her shoulder/mid thoracic region along with burning sensation in that area. Has to wear sports bras during the day. No known injury to the breast. Patient states she has some slight swelling of the left breast with increased fullness. Denies nipple discharge, redness, or lumps/masses of the breast. Denies fever or chills. No rash. Pt great aunt on paternal side had breast cancer, otherwise no other family history.    Review of Systems  Constitutional: Negative for activity change, appetite change and fever.  Respiratory: Negative for chest tightness and shortness of breath.   Cardiovascular: Negative for chest pain.  Musculoskeletal:       Positive for left shoulder pain        Objective:   Physical Exam Exam conducted with a chaperone present.  Chest:     Chest wall: No mass or lacerations.     Breasts:        Right: No swelling, bleeding, inverted nipple, mass, nipple discharge, skin change or tenderness.      Comments: Left Breast: No erythema, nipple  discharge or bleeding. No masses or lesions present. Skin intact. Generalized tenderness with palpation. Minimal edema. No erythema or warmth.  Abdominal:     General: Abdomen is flat. A surgical scar is present.     Palpations: Abdomen is soft. There is no mass.     Tenderness: There is abdominal tenderness in the epigastric area.  Musculoskeletal:     Comments: Tenderness along the mid-thoracic region from the spine to shoulder blade to the left breast, following a dermatomal line.   Lymphadenopathy:     Upper Body:     Right upper body: No supraclavicular, axillary or pectoral adenopathy.     Left upper body: No supraclavicular, axillary or pectoral adenopathy.           Assessment & Plan:   Problem List Items Addressed This Visit      Digestive   GERD (gastroesophageal reflux disease)   Relevant Medications   pantoprazole (PROTONIX) 40 MG tablet   dicyclomine (BENTYL) 10 MG capsule   Irritable bowel syndrome   Relevant Medications   pantoprazole (PROTONIX) 40 MG tablet   dicyclomine (BENTYL) 10 MG capsule    Other Visit Diagnoses    Breast pain, left    -  Primary       Meds ordered this encounter  Medications  . predniSONE (DELTASONE) 20 MG tablet    Sig: 3 po qd x 3 d then 2 po qd x 3 d then 1 po qd x  2 d    Dispense:  17 tablet    Refill:  0    Order Specific Question:   Supervising Provider    Answer:   Sallee Lange A [id number]  . pantoprazole (PROTONIX) 40 MG tablet    Sig: Take 1 tablet (40 mg total) by mouth daily. Prn acid reflux    Dispense:  30 tablet    Refill:  1    Order Specific Question:   Supervising Provider    Answer:   Sallee Lange A [[id number]]  . dicyclomine (BENTYL) 10 MG capsule    Sig: Take 1 capsule (10 mg total) by mouth 4 (four) times daily -  before meals and at bedtime.    Dispense:  60 capsule    Refill:  2    Order Specific Question:   Supervising Provider    Answer:   Sallee Lange A [[id number]]  . HYDROcodone-acetaminophen  (NORCO/VICODIN) 5-325 MG tablet    Sig: Take 1 tablet by mouth every 4 (four) hours as needed.    Dispense:  20 tablet    Refill:  0    Order Specific Question:   Supervising Provider    Answer:   Sallee Lange A [[id number]]   -Pt clinical presentation consist with musculoskeletal origin of breast pain, most likely originating from left upper back. Most likely an inflammatory process, as opposed to infectious process. Prednisone taper ordered. If pain worsening or accompanied by fever or chills, please follow-up immediately. Call back in 4-5 days if no improvement, consider an ultrasound of breast. Pantoprazole ordered for epigastric pain and acid reflux.  A review of the chart after the visit needs followup with GI per notes. A my chart message was sent to patient recommending she schedule a recheck with their office.

## 2019-08-25 NOTE — Progress Notes (Signed)
   Subjective:    Patient ID: Melissa Livingston, female    DOB: 09/09/1996, 23 y.o.   MRN: [id number]  HPIFollow up on left breast pain. States pain is worse at night when sleeping. Has to wear sports bras during the day. Pain started about 2 and a half weeks ago. Was prescribed doxy and pt had to stop half way through the prescription due to abdominal cramping.     Review of Systems     Objective:   Physical Exam        Assessment & Plan:

## 2019-08-25 NOTE — Patient Instructions (Addendum)
-Can try using Lidocaine or Voltaren gel for tenderness  Gastroesophageal Reflux Disease, Adult Gastroesophageal reflux (GER) happens when acid from the stomach flows up into the tube that connects the mouth and the stomach (esophagus). Normally, food travels down the esophagus and stays in the stomach to be digested. With GER, food and stomach acid sometimes move back up into the esophagus. You may have a disease called gastroesophageal reflux disease (GERD) if the reflux:  Happens often.  Causes frequent or very bad symptoms.  Causes problems such as damage to the esophagus. When this happens, the esophagus becomes sore and swollen (inflamed). Over time, GERD can make small holes (ulcers) in the lining of the esophagus. What are the causes? This condition is caused by a problem with the muscle between the esophagus and the stomach. When this muscle is weak or not normal, it does not close properly to keep food and acid from coming back up from the stomach. The muscle can be weak because of:  Tobacco use.  Pregnancy.  Having a certain type of hernia (hiatal hernia).  Alcohol use.  Certain foods and drinks, such as coffee, chocolate, onions, and peppermint. What increases the risk? You are more likely to develop this condition if you:  Are overweight.  Have a disease that affects your connective tissue.  Use NSAID medicines. What are the signs or symptoms? Symptoms of this condition include:  Heartburn.  Difficult or painful swallowing.  The feeling of having a lump in the throat.  A bitter taste in the mouth.  Bad breath.  Having a lot of saliva.  Having an upset or bloated stomach.  Belching.  Chest pain. Different conditions can cause chest pain. Make sure you see your doctor if you have chest pain.  Shortness of breath or noisy breathing (wheezing).  Ongoing (chronic) cough or a cough at night.  Wearing away of the surface of teeth (tooth enamel).  Weight  loss. How is this treated? Treatment will depend on how bad your symptoms are. Your doctor may suggest:  Changes to your diet.  Medicine.  Surgery. Follow these instructions at home: Eating and drinking   Follow a diet as told by your doctor. You may need to avoid foods and drinks such as: ? Coffee and tea (with or without caffeine). ? Drinks that contain alcohol. ? Energy drinks and sports drinks. ? Bubbly (carbonated) drinks or sodas. ? Chocolate and cocoa. ? Peppermint and mint flavorings. ? Garlic and onions. ? Horseradish. ? Spicy and acidic foods. These include peppers, chili powder, curry powder, vinegar, hot sauces, and BBQ sauce. ? Citrus fruit juices and citrus fruits, such as oranges, lemons, and limes. ? Tomato-based foods. These include red sauce, chili, salsa, and pizza with red sauce. ? Fried and fatty foods. These include donuts, french fries, potato chips, and high-fat dressings. ? High-fat meats. These include hot dogs, rib eye steak, sausage, ham, and bacon. ? High-fat dairy items, such as whole milk, butter, and cream cheese.  Eat small meals often. Avoid eating large meals.  Avoid drinking large amounts of liquid with your meals.  Avoid eating meals during the 2-3 hours before bedtime.  Avoid lying down right after you eat.  Do not exercise right after you eat. Lifestyle   Do not use any products that contain nicotine or tobacco. These include cigarettes, e-cigarettes, and chewing tobacco. If you need help quitting, ask your doctor.  Try to lower your stress. If you need help doing this, ask  your doctor.  If you are overweight, lose an amount of weight that is healthy for you. Ask your doctor about a safe weight loss goal. General instructions  Pay attention to any changes in your symptoms.  Take over-the-counter and prescription medicines only as told by your doctor. Do not take aspirin, ibuprofen, or other NSAIDs unless your doctor says it is  okay.  Wear loose clothes. Do not wear anything tight around your waist.  Raise (elevate) the head of your bed about 6 inches (15 cm).  Avoid bending over if this makes your symptoms worse.  Keep all follow-up visits as told by your doctor. This is important. Contact a doctor if:  You have new symptoms.  You lose weight and you do not know why.  You have trouble swallowing or it hurts to swallow.  You have wheezing or a cough that keeps happening.  Your symptoms do not get better with treatment.  You have a hoarse voice. Get help right away if:  You have pain in your arms, neck, jaw, teeth, or back.  You feel sweaty, dizzy, or light-headed.  You have chest pain or shortness of breath.  You throw up (vomit) and your throw-up looks like blood or coffee grounds.  You pass out (faint).  Your poop (stool) is bloody or black.  You cannot swallow, drink, or eat. Summary  If a person has gastroesophageal reflux disease (GERD), food and stomach acid move back up into the esophagus and cause symptoms or problems such as damage to the esophagus.  Treatment will depend on how bad your symptoms are.  Follow a diet as told by your doctor.  Take all medicines only as told by your doctor. This information is not intended to replace advice given to you by your health care provider. Make sure you discuss any questions you have with your health care provider. Document Released: 05/11/2008 Document Revised: 06/01/2018 Document Reviewed: 06/01/2018 Elsevier Patient Education  2020 [employer].

## 2019-08-26 ENCOUNTER — Encounter: Payer: Self-pay | Admitting: Nurse Practitioner

## 2019-08-31 ENCOUNTER — Encounter: Payer: Self-pay | Admitting: Nurse Practitioner

## 2019-08-31 ENCOUNTER — Ambulatory Visit: Payer: Managed Care, Other (non HMO) | Admitting: Orthopaedic Surgery

## 2019-08-31 NOTE — Telephone Encounter (Signed)
I recommend a recheck office visit with Hoyle Sauer on Friday

## 2019-08-31 NOTE — Telephone Encounter (Signed)
Pt contacted and transferred up front to schedule appt with Hoyle Sauer on Friday

## 2019-09-01 ENCOUNTER — Other Ambulatory Visit: Payer: Self-pay

## 2019-09-01 ENCOUNTER — Ambulatory Visit (INDEPENDENT_AMBULATORY_CARE_PROVIDER_SITE_OTHER): Payer: Managed Care, Other (non HMO) | Admitting: Nurse Practitioner

## 2019-09-01 VITALS — BP 130/78 | Temp 97.8°F | Wt 224.6 lb

## 2019-09-01 DIAGNOSIS — R0789 Other chest pain: Secondary | ICD-10-CM | POA: Diagnosis not present

## 2019-09-01 DIAGNOSIS — N644 Mastodynia: Secondary | ICD-10-CM | POA: Diagnosis not present

## 2019-09-01 MED ORDER — GABAPENTIN 300 MG PO CAPS: 300.0000 mg | ORAL_CAPSULE | Freq: Two times a day (BID) | ORAL | 2 refills | Status: DC

## 2019-09-01 MED ORDER — VALACYCLOVIR HCL 1 G PO TABS: 1000.0000 mg | ORAL_TABLET | Freq: Three times a day (TID) | ORAL | 0 refills | Status: DC

## 2019-09-01 NOTE — Progress Notes (Signed)
   Subjective:    Patient ID: Melissa Livingston, female    DOB: 1996/05/26, 23 y.o.   MRN: [id number]  HPI Pt is still having issues with left breast pain. Pt states that she is having swelling and pain. Pt has tried Doxy, Hydrocodone, Prednisone with no relief. Pt states she has tried Ibuprofen and Tylenol.  Presents for recheck of her left breast pain.  See previous notes.  Began about 3 weeks ago.  Took doxycycline for 3 days with no relief, had to stop due to GI symptoms.  Just completed a week of prednisone, no relief and the breast pain.  No nipple discharge.  Has not noticed any masses warmth or redness.  No fever chills.  Mild fatigue.  Describes as a pain with a pulling sensation heavy in the chest area at times.  Difficulty wearing her bra due to tenderness around the nipple area.  No shortness of breath.  [facility] paternal great aunt with breast cancer.  No rash.  Some relief with warm compresses.  Review of Systems     Objective:   Physical Exam NAD.  Alert, oriented.  Lungs clear.  Heart regular rate rhythm.  Breast exam: Minimal fine nodularity noted in both breast, no dominant masses.  No erythema warmth skin changes or nipple discharge.  Axilla no adenopathy.  Distinct tenderness noted in the left breast extending up into the tail of Spence.  Area going to the thoracic area along a dermatomal line is no longer tender as noted on previous exam.  There are 2 early mildly erythematous papules noted on the left side along the dermatomal line but too early to see if this is a rash consistent with zoster.       Assessment & Plan:  Breast pain, left - Plan: DG Chest 2 View, US BREAST COMPLETE UNI LEFT INC AXILLA  Chest wall pain - Plan: DG Chest 2 View, US BREAST COMPLETE UNI LEFT INC AXILLA  Meds ordered this encounter  Medications  . valACYclovir (VALTREX) 1000 MG tablet    Sig: Take 1 tablet (1,000 mg total) by mouth 3 (three) times daily. For shingles    Dispense:  21 tablet    Refill:   0    PLEASE HOLD MEDICATION UNTIL PATIENT CALLS. THANKS!    Order Specific Question:   Supervising Provider    Answer:   Sallee Lange A [[id number]]  . gabapentin (NEURONTIN) 300 MG capsule    Sig: Take 1 capsule (300 mg total) by mouth 2 (two) times daily.    Dispense:  60 capsule    Refill:  2    Order Specific Question:   Supervising Provider    Answer:   Sallee Lange A [[id number]]   A prescription for Valtrex was sent to the pharmacy to be on hold over the weekend, patient to have her mother observe her back for further lesions in this area.  If noted start Valtrex immediately. Schedule left breast ultrasound and chest x-ray.  If patient develops symptoms of zoster, to call the office next week so we can cancel test. Start gabapentin as directed this weekend.  Call back next week if no improvement in her pain. Warning signs reviewed.  Call back or go to urgent care/ED if any problems. Return if symptoms worsen or fail to improve.

## 2019-09-02 ENCOUNTER — Encounter: Payer: Self-pay | Admitting: Nurse Practitioner

## 2019-09-05 ENCOUNTER — Other Ambulatory Visit: Payer: Self-pay

## 2019-09-05 ENCOUNTER — Ambulatory Visit (HOSPITAL_COMMUNITY)
Admission: RE | Admit: 2019-09-05 | Discharge: 2019-09-05 | Disposition: A | Discharge status: Home / Self Care | Payer: Managed Care, Other (non HMO) | Source: Ambulatory Visit | Attending: Nurse Practitioner | Admitting: Nurse Practitioner

## 2019-09-05 DIAGNOSIS — R0789 Other chest pain: Secondary | ICD-10-CM | POA: Diagnosis present

## 2019-09-05 DIAGNOSIS — N644 Mastodynia: Secondary | ICD-10-CM | POA: Insufficient documentation

## 2019-09-06 ENCOUNTER — Encounter: Payer: Self-pay | Admitting: Nurse Practitioner

## 2019-09-06 NOTE — Progress Notes (Signed)
Pt states she does not have a rash and did not start valtrex since a rash did not come up. States they only did the [place] at [facility] and she does not know why they did not do the cxr. She asked if she still needs to do it since she is not having chest pain now.

## 2019-09-14 ENCOUNTER — Other Ambulatory Visit: Payer: Self-pay

## 2019-09-14 ENCOUNTER — Ambulatory Visit (INDEPENDENT_AMBULATORY_CARE_PROVIDER_SITE_OTHER): Payer: Managed Care, Other (non HMO) | Admitting: Orthopaedic Surgery

## 2019-09-14 ENCOUNTER — Encounter: Payer: Self-pay | Admitting: Orthopaedic Surgery

## 2019-09-14 VITALS — BP 116/62 | HR 87 | Ht 63.5 in | Wt 224.0 lb

## 2019-09-14 DIAGNOSIS — M25562 Pain in left knee: Secondary | ICD-10-CM | POA: Diagnosis not present

## 2019-09-14 DIAGNOSIS — G8929 Other chronic pain: Secondary | ICD-10-CM | POA: Diagnosis not present

## 2019-09-14 MED ORDER — HYDROCODONE-ACETAMINOPHEN 5-325 MG PO TABS: ORAL_TABLET | ORAL | 0 refills | Status: DC

## 2019-09-14 MED ORDER — NAPROXEN 500 MG PO TABS: 500.0000 mg | ORAL_TABLET | Freq: Two times a day (BID) | ORAL | 5 refills | Status: DC

## 2019-09-14 NOTE — Progress Notes (Signed)
Patient PQ:[id number] Melissa Livingston, female DOB:[birth year], 23 y.o. [id number]  Chief Complaint  Patient presents with  . Knee Pain    left    HPI  Melissa Livingston is a 23 y.o. female who has chronic pain of the left knee.  She is out of her Naprosyn.  She has swelling and popping but no giving way. She has no new trauma.     Body mass index is 39.06 kg/m.  ROS  Review of Systems  HENT: Negative for congestion.   Respiratory: Negative for cough and shortness of breath.   Cardiovascular: Negative for chest pain and leg swelling.  Musculoskeletal: Positive for arthralgias, gait problem and joint swelling.  All other systems reviewed and are negative.   All other systems reviewed and are negative.  The following is a summary of the past history medically, past history surgically, known current medicines, social history and family history.  This information is gathered electronically by the computer from prior information and documentation.  I review this each visit and have found including this information at this point in the chart is beneficial and informative.    Past Medical History:  Diagnosis Date  . Anemia   . Anxiety   . Asthma    as child  . Endometritis 01/11/2015  . GERD (gastroesophageal reflux disease)   . History of ovarian cyst 01/28/2015  . IBS (irritable bowel syndrome)   . Nausea and vomiting 01/11/2015  . Ovarian cyst 01/14/2015  . Pelvic pain in female 01/11/2015  . Pregnancy induced hypertension    with pregnancy; no meds now.  . Pregnant 02/05/2016    Past Surgical History:  Procedure Laterality Date  . CESAREAN SECTION N/A 09/29/2014   Procedure: CESAREAN SECTION;  Surgeon: Melissa Kind, MD;  Location: [facility];  Service: Obstetrics;  Laterality: N/A;  . CESAREAN SECTION N/A 09/25/2016   Procedure: CESAREAN SECTION;  Surgeon: Melissa Kind, MD;  Location: [facility];  Service: Obstetrics;  Laterality: N/A;  . CHOLECYSTECTOMY N/A 03/07/2018   Procedure:  LAPAROSCOPIC CHOLECYSTECTOMY;  Surgeon: Melissa Signs, MD;  Location: [facility];  Service: General;  Laterality: N/A;  . COLONOSCOPY WITH PROPOFOL N/A 08/02/2017   Dr. Gala Livingston: moderate internal hemorhoids, distal 5 cm of TI also appeared normal  . ESOPHAGOGASTRODUODENOSCOPY (EGD) WITH ESOPHAGEAL DILATION N/A 07/19/2013   Dr. Rourk:normal appearing esophagus Melissa/p dilation, normal D1 and D2, unremarkable path   . ESOPHAGOGASTRODUODENOSCOPY (EGD) WITH PROPOFOL N/A 08/02/2017   Normal esophagus, small hiatal hernia, normal duodenum  . none    . WISDOM TOOTH EXTRACTION      Family History  Problem Relation Age of Onset  . Multiple sclerosis Mother   . Heart disease Maternal Grandfather   . Heart disease Paternal Uncle   . Diabetes Paternal Uncle   . Hypertension Paternal Uncle   . Cancer Other        father'Melissa aunt had breast cancer  . Colon cancer Other        maternal great uncle, age greater than 87  . Crohn'Melissa disease Other        couple of family members on father'Melissa side of family    Social History Social History   Tobacco Use  . Smoking status: Never Smoker  . Smokeless tobacco: Never Used  Substance Use Topics  . Alcohol use: No    Alcohol/week: 0.0 standard drinks  . Drug use: No    Allergies  Allergen Reactions  . Adhesive [Tape] Other (See  Comments)    Blisters   . Lorabid [Loracarbef] Hives, Swelling and Other (See Comments)    Mouth swelling Can take amoxil and augmentin   . Latex Itching and Rash  . Orange Fruit [Citrus] Rash    Current Outpatient Medications  Medication Sig Dispense Refill  . acetaminophen (TYLENOL) 325 MG tablet Take 650 mg by mouth every 6 (six) hours as needed for mild pain, moderate pain or headache.     . cyclobenzaprine (FLEXERIL) 10 MG tablet Take 1 tablet (10 mg total) by mouth 3 (three) times daily as needed for muscle spasms. 21 tablet 2  . dicyclomine (BENTYL) 10 MG capsule Take 1 capsule (10 mg total) by mouth 4 (four) times daily -   before meals and at bedtime. 60 capsule 2  . etonogestrel (NEXPLANON) 68 MG IMPL implant 1 each by Subdermal route once.    . furosemide (LASIX) 20 MG tablet Take one tablet by mouth every morning prn edema 30 tablet 1  . gabapentin (NEURONTIN) 300 MG capsule Take 1 capsule (300 mg total) by mouth 2 (two) times daily. 60 capsule 2  . ketorolac (TORADOL) 10 MG tablet Take one tablet by mouth every day as needed for severe headaches 16 tablet 0  . naproxen (NAPROSYN) 500 MG tablet Take 1 tablet (500 mg total) by mouth 2 (two) times daily with a meal. 60 tablet 5  . pantoprazole (PROTONIX) 40 MG tablet Take 1 tablet (40 mg total) by mouth daily. Prn acid reflux 30 tablet 1  . potassium chloride (K-DUR) 10 MEQ tablet Take one tablet by mouth every morning prn (take with lasix) 30 tablet 1  . SUMAtriptan (IMITREX) 50 MG tablet Take one tablet STAT with onset of migraine; may repeat 2 hours later prn 8 tablet 3  . topiramate (TOPAMAX) 50 MG tablet Take one tablet po q a.m. and then 2 tablets po qhs 90 tablet 5  . valACYclovir (VALTREX) 1000 MG tablet Take 1 tablet (1,000 mg total) by mouth 3 (three) times daily. For shingles 21 tablet 0  . HYDROcodone-acetaminophen (NORCO/VICODIN) 5-325 MG tablet One tablet every six hours for pain.  Limit 7 days. 28 tablet 0   No current facility-administered medications for this visit.      Physical Exam  Blood pressure 116/62, pulse 87, height 5' 3.5" (1.613 m), weight 224 lb (101.6 kg).  Constitutional: overall normal hygiene, normal nutrition, well developed, normal grooming, normal body habitus. Assistive device:none  Musculoskeletal: gait and station Limp left, muscle tone and strength are normal, no tremors or atrophy is present.  .  Neurological: coordination overall normal.  Deep tendon reflex/nerve stretch intact.  Sensation normal.  Cranial nerves II-XII intact.   Skin:   Normal overall no scars, lesions, ulcers or rashes. No  psoriasis.  Psychiatric: Alert and oriented x 3.  Recent memory intact, remote memory unclear.  Normal mood and affect. Well groomed.  Good eye contact.  Cardiovascular: overall no swelling, no varicosities, no edema bilaterally, normal temperatures of the legs and arms, no clubbing, cyanosis and good capillary refill.  Lymphatic: palpation is normal.  Left knee has crepitus, no effusion, ROM 0 to 115, slight imp left, NV intact, knee is stable. All other systems reviewed and are negative   The patient has been educated about the nature of the problem(Melissa) and counseled on treatment options.  The patient appeared to understand what I have discussed and is in agreement with it.  Encounter Diagnosis  Name Primary?  Marland Kitchen  Chronic pain of left knee Yes    PLAN Call if any problems.  Precautions discussed.  Continue current medications.   Return to clinic 3 months   I have reviewed the [facility] web site prior to prescribing narcotic medicine for this patient.   Electronically Signed Sanjuana Kava, MD 10/8/202010:01 AM

## 2019-09-28 ENCOUNTER — Other Ambulatory Visit (INDEPENDENT_AMBULATORY_CARE_PROVIDER_SITE_OTHER): Payer: Managed Care, Other (non HMO) | Admitting: *Deleted

## 2019-09-28 ENCOUNTER — Encounter: Payer: Self-pay | Admitting: Family Medicine

## 2019-09-28 ENCOUNTER — Other Ambulatory Visit: Payer: Self-pay

## 2019-09-28 DIAGNOSIS — Z23 Encounter for immunization: Secondary | ICD-10-CM

## 2019-09-30 ENCOUNTER — Other Ambulatory Visit: Payer: Managed Care, Other (non HMO)

## 2019-10-19 ENCOUNTER — Encounter: Payer: Self-pay | Admitting: Family Medicine

## 2019-10-30 ENCOUNTER — Other Ambulatory Visit: Payer: Managed Care, Other (non HMO)

## 2019-11-20 ENCOUNTER — Ambulatory Visit (INDEPENDENT_AMBULATORY_CARE_PROVIDER_SITE_OTHER): Payer: PRIVATE HEALTH INSURANCE | Admitting: Advanced Practice Midwife

## 2019-11-20 ENCOUNTER — Encounter: Payer: Self-pay | Admitting: Advanced Practice Midwife

## 2019-11-20 ENCOUNTER — Other Ambulatory Visit: Payer: Self-pay

## 2019-11-20 VITALS — BP 130/81 | HR 88 | Ht 63.5 in | Wt 223.0 lb

## 2019-11-20 DIAGNOSIS — Z3046 Encounter for surveillance of implantable subdermal contraceptive: Secondary | ICD-10-CM

## 2019-11-20 DIAGNOSIS — N898 Other specified noninflammatory disorders of vagina: Secondary | ICD-10-CM | POA: Diagnosis not present

## 2019-11-20 DIAGNOSIS — B379 Candidiasis, unspecified: Secondary | ICD-10-CM | POA: Diagnosis not present

## 2019-11-20 MED ORDER — FLUCONAZOLE 150 MG PO TABS: ORAL_TABLET | ORAL | 2 refills | Status: DC

## 2019-11-20 MED ORDER — PNV PRENATAL PLUS MULTIVITAMIN 27-1 MG PO TABS: 1.0000 | ORAL_TABLET | Freq: Every day | ORAL | 11 refills | Status: DC

## 2019-11-20 NOTE — Progress Notes (Addendum)
GYNECOLOGY OFFICE VISIT NOTE  History:  23 y.o. [id number] here today for Nexplanon removal. She is also reporting some vaginal itching, irritation, discharge and odor x1 week that she would like evaluated. Has tried Azo for yeast, which has not helped. Feels like she has a "cut" d/t pubic hair being pulled during intercourse  Past Medical History:  Diagnosis Date   Anemia    Anxiety    Asthma    as child   Endometritis 01/11/2015   GERD (gastroesophageal reflux disease)    History of ovarian cyst 01/28/2015   IBS (irritable bowel syndrome)    Nausea and vomiting 01/11/2015   Ovarian cyst 01/14/2015   Pelvic pain in female 01/11/2015   Pregnancy induced hypertension    with pregnancy; no meds now.   Pregnant 02/05/2016    Past Surgical History:  Procedure Laterality Date   CESAREAN SECTION N/A 09/29/2014   Procedure: CESAREAN SECTION;  Surgeon: Jonnie Kind, MD;  Location: [facility];  Service: Obstetrics;  Laterality: N/A;   CESAREAN SECTION N/A 09/25/2016   Procedure: CESAREAN SECTION;  Surgeon: Jonnie Kind, MD;  Location: [facility];  Service: Obstetrics;  Laterality: N/A;   CHOLECYSTECTOMY N/A 03/07/2018   Procedure: LAPAROSCOPIC CHOLECYSTECTOMY;  Surgeon: Aviva Signs, MD;  Location: [facility];  Service: General;  Laterality: N/A;   COLONOSCOPY WITH PROPOFOL N/A 08/02/2017   Dr. Gala Romney: moderate internal hemorhoids, distal 5 cm of TI also appeared normal   ESOPHAGOGASTRODUODENOSCOPY (EGD) WITH ESOPHAGEAL DILATION N/A 07/19/2013   Dr. Vivi Ferns appearing esophagus s/p dilation, normal D1 and D2, unremarkable path    ESOPHAGOGASTRODUODENOSCOPY (EGD) WITH PROPOFOL N/A 08/02/2017   Normal esophagus, small hiatal hernia, normal duodenum   none     WISDOM TOOTH EXTRACTION      The following portions of the patient's history were reviewed and updated as appropriate: allergies, current medications, past family history, past medical history, past social history, past surgical history  and problem list.    Review of Systems:  Genito-Urinary ROS: no dysuria, trouble voiding, or hematuria positive for - vaginal itching, odor, and discharge   Objective:  Physical Exam BP 130/81 (BP Location: Left Arm, Patient Position: Sitting, Cuff Size: Large)   Pulse 88   Ht 5' 3.5" (1.613 m)   Wt 223 lb (101.2 kg)   LMP 10/31/2019 (Approximate)   BMI 38.88 kg/m  CONSTITUTIONAL: Well-developed, well-nourished female in no acute distress.  HENT:  Normocephalic, atraumatic EYES: Conjunctivae and EOM are normal NECK: Normal range of motion SKIN: Skin is warm and dry NEUROLOGIC: Alert and oriented to person, place, and time PSYCHIATRIC: Normal mood and affect CARDIOVASCULAR: Normal heart rate noted RESPIRATORY: Effort and rate normal ABDOMEN: Soft, no distention  PELVIC:  1 small open lesion (~79mm) noted on bottom of left labia; no other lesions noted; viral culture obtained. Pink, rugated vaginal mucosa with small amount of thick, clumpy white/yellow discharge, c/w yeast. Cervix, pink, smooth with small amount of white/yellow discharge coming through os. Swabs obtained. Wet prep obtained and looked under microscope in office which shows yeast.     Last pap 06/05/2019 and was normal          MUSCULOSKELETAL: Normal range of motion  Labs and Imaging No results found.   Nexplanon Removal Patient identified, informed consent performed, consent signed.  Appropriate time out taken. Nexplanon site identified. Area prepped in usual sterile fashon. One ml of 1% lidocaine was used to anesthetize the area at the distal end of  the implant. A small stab incision was made right beside the implant on the distal portion. The Nexplanon rod was grasped using hemostats and removed without difficulty. There was minimal blood loss. There were no complications. 3 ml of 1% lidocaine was injected around the incision for post-procedure analgesia. Steri-strips were applied over the small incision. A pressure  bandage was applied to reduce any bruising. The patient tolerated the procedure well and was given post procedure instructions. Patient is planning to attempt conception.  Assessment & Plan:   1. Encounter for Nexplanon removal   2. Vaginal discharge   3. Yeast infection   4. Vaginal lesion    1. Encounter for Nexplanon removal - Here for Nexplanon removal; pt wanting to conceive - Nexplanon removed without difficulty - Encouraged patient to start taking PNV and folic acid to prevent neural tube defects  2. Vaginal discharge - Pt c/o vaginal discharge - Swabs obtained - NuSwab Vaginitis Plus (VG+)  3. Yeast infection - Obvious yeast noted on speculum exam and on microscopy  - Will Rx Diflucan for yeast  4. Vaginal lesion - 1 small, open lesion noted on left labia - HSV culture obtained - Herpes simplex virus culture   Follow up as needed   Total face-to-face time with patient: 25 minutes   Melissa Livingston, [facility] 11/20/2019 3:54 PM   I personally saw and evaluated the patient, performing the key elements of the service. I developed and verified the management plan that is described in the resident's/student's note, and I agree with the content with my edits above.   Nigel Berthold, CNM 11/20/2019 3:55 PM

## 2019-11-20 NOTE — Patient Instructions (Addendum)
Ovulation predictor kits follow directions on the box

## 2019-11-25 LAB — NUSWAB VAGINITIS PLUS (VG+)
Candida albicans, NAA: POSITIVE — AB
Candida glabrata, NAA: NEGATIVE
Chlamydia trachomatis, NAA: NEGATIVE
Neisseria gonorrhoeae, NAA: NEGATIVE
Trich vag by NAA: NEGATIVE

## 2019-11-25 LAB — SPECIMEN STATUS REPORT

## 2019-11-25 LAB — HERPES SIMPLEX VIRUS CULTURE

## 2019-11-28 ENCOUNTER — Other Ambulatory Visit: Payer: Self-pay

## 2019-11-28 ENCOUNTER — Encounter: Payer: Self-pay | Admitting: Nurse Practitioner

## 2019-11-28 ENCOUNTER — Ambulatory Visit (INDEPENDENT_AMBULATORY_CARE_PROVIDER_SITE_OTHER): Payer: PRIVATE HEALTH INSURANCE | Admitting: Nurse Practitioner

## 2019-11-28 ENCOUNTER — Encounter: Payer: Self-pay | Admitting: Internal Medicine

## 2019-11-28 DIAGNOSIS — K582 Mixed irritable bowel syndrome: Secondary | ICD-10-CM | POA: Diagnosis not present

## 2019-11-28 DIAGNOSIS — K219 Gastro-esophageal reflux disease without esophagitis: Secondary | ICD-10-CM

## 2019-11-28 DIAGNOSIS — R1010 Upper abdominal pain, unspecified: Secondary | ICD-10-CM | POA: Diagnosis not present

## 2019-11-28 DIAGNOSIS — R109 Unspecified abdominal pain: Secondary | ICD-10-CM | POA: Insufficient documentation

## 2019-11-28 MED ORDER — DICYCLOMINE HCL 10 MG PO CAPS: 10.0000 mg | ORAL_CAPSULE | Freq: Three times a day (TID) | ORAL | 3 refills | Status: DC | PRN

## 2019-11-28 NOTE — Assessment & Plan Note (Signed)
The patient is having upper abdominal/epigastric pain associated with nausea and it is described as sharp.  Typically worse postprandial.  I feel she is likely having worsening GERD.  Further current recommendations as per above.  Of note, she does have chronic abdominal pain.  She may never be "pain-free".  Given her constellation of symptoms including abdominal pain, nausea that is worsening, despite PPI and increased dose of PPI as of today.  She will follow-up in 4 to 6 weeks and if no improvement we may need to repeat her upper endoscopy which was last completed in 2018.  Given her chronic NSAID use she may be developing erosions or ulcerations or at least acute gastritis.

## 2019-11-28 NOTE — Assessment & Plan Note (Signed)
The patient typically has constipation.  However more recently she has had abdominal cramping associated with postprandial urgency and diarrhea, though no watery stools.  Typically 2-4 bowel movements a day.  I doubt significant infection at this point given that the diarrhea just started and she tends to revert back to constipation.  Discussed use of Bentyl 10 mg 3 times a day as needed for abdominal cramping or diarrhea.  I advised her to avoid this when she is constipated.  She verbalized understanding.  Follow-up in 4 to 6 weeks.  Call for any worsening or severe symptoms.

## 2019-11-28 NOTE — Progress Notes (Signed)
Referring Provider: Kathyrn Drown, MD Primary Care Physician:  Kathyrn Drown, MD Primary GI:  Dr. Gala Romney  NOTE: Service was provided via telemedicine and was requested by the patient due to COVID-19 pandemic.  Method of visit: [email or web address].Me  Patient Location: Work  Psychologist, clinical  Reason for Phone Visit: Follow-up  The patient was consented to phone follow-up via telephone encounter including billing of the encounter (yes/no): Yes  Persons present on the phone encounter, with roles: None  Total time (minutes) spent on medical discussion: 23 minutes  Chief Complaint  Patient presents with  . Abdominal Pain    epigastric pain, comes/goes sharp pains  . Nausea    a lot and had vomiting 1 time  . Constipation    last week but now having diarrhea    HPI:   Melissa Livingston is a 23 y.o. female who presents for virtual visit regarding: Abdominal pain, GERD.  The patient was last seen in our office 02/02/2018 for epigastric abdominal pain and nausea.  Noted history of chronic abdominal pain, rectal bleeding, GERD.  Colonoscopy and EGD had recently been updated which noted internal hemorrhoids on colonoscopy otherwise normal.  EGD essentially normal.  Previously seen for constipation given Linzess 290 mcg samples which she has been taking only when needed and having a bowel movement every 3 to 4 days.  Pain not associated with relief of constipation.  LFTs previously normal with unremarkable abdominal ultrasound.  HIDA scan with EF of 42%.  She does note abdominal pain worsens with eating.  Recommended maximizing bowel regimen prior to referral to general surgery.  Recommended referral to surgery to discuss possible benefits of removing gallbladder, continue Linzess but take it every day 30 minutes before breakfast, follow-up in 4 months.  The patient was a no-show to her follow-up office visit.  Appears patient underwent laparoscopic cholecystectomy on 03/07/2018.  Doing well  postoperatively, no postoperative complications.  Follow-up as needed.  Today she states she's doing ok overall. Still with epigastric pain, seems worse then before. Typically post-prandial/triggered by eating. Associated with nausea, has only had one recent episode of vomiting (this week) after eating fried chicken which she notes she shouldn't have done. Almost all foods cause symptoms. Pain described as epigastric, sharp/stabbing pain and then cramping across her stomach. Last week she was constipated but this week has post-prandial urgency and loose consistent with [place] 6, not watery. Recently with diarrhea about 2-4 times a day. Abdominal pain improves significantly (though doesn't resolve) after a bowel movement. Denies hematochezia unless constipated when she'll have scant toilet tissue hematochezia. Denies melena. She is on Protonix 40 mg daily. Denies fever, chills, unintentional weight loss. Cholecystectomy initially helped her pain, but is now back. Denies URI or flu-like symptoms. Denies loss of sense of taste or smell. Denies chest pain, dyspnea, dizziness, lightheadedness, syncope, near syncope. Denies any other upper or lower GI symptoms.   She does take Naproxen on average 3 times a week for knee pain.  Past Medical History:  Diagnosis Date  . Anemia   . Anxiety   . Asthma    as child  . Endometritis 01/11/2015  . GERD (gastroesophageal reflux disease)   . History of ovarian cyst 01/28/2015  . IBS (irritable bowel syndrome)   . Nausea and vomiting 01/11/2015  . Ovarian cyst 01/14/2015  . Pelvic pain in female 01/11/2015  . Pregnancy induced hypertension    with pregnancy; no meds now.  . Pregnant 02/05/2016  Past Surgical History:  Procedure Laterality Date  . CESAREAN SECTION N/A 09/29/2014   Procedure: CESAREAN SECTION;  Surgeon: Jonnie Kind, MD;  Location: [facility];  Service: Obstetrics;  Laterality: N/A;  . CESAREAN SECTION N/A 09/25/2016   Procedure: CESAREAN SECTION;   Surgeon: Jonnie Kind, MD;  Location: [facility];  Service: Obstetrics;  Laterality: N/A;  . CHOLECYSTECTOMY N/A 03/07/2018   Procedure: LAPAROSCOPIC CHOLECYSTECTOMY;  Surgeon: Aviva Signs, MD;  Location: [facility];  Service: General;  Laterality: N/A;  . COLONOSCOPY WITH PROPOFOL N/A 08/02/2017   Dr. Gala Romney: moderate internal hemorhoids, distal 5 cm of TI also appeared normal  . ESOPHAGOGASTRODUODENOSCOPY (EGD) WITH ESOPHAGEAL DILATION N/A 07/19/2013   Dr. Rourk:normal appearing esophagus s/p dilation, normal D1 and D2, unremarkable path   . ESOPHAGOGASTRODUODENOSCOPY (EGD) WITH PROPOFOL N/A 08/02/2017   Normal esophagus, small hiatal hernia, normal duodenum  . none    . WISDOM TOOTH EXTRACTION      Current Outpatient Medications  Medication Sig Dispense Refill  . acetaminophen (TYLENOL) 325 MG tablet Take 650 mg by mouth every 6 (six) hours as needed for mild pain, moderate pain or headache.     . cyclobenzaprine (FLEXERIL) 10 MG tablet Take 1 tablet (10 mg total) by mouth 3 (three) times daily as needed for muscle spasms. 21 tablet 2  . furosemide (LASIX) 20 MG tablet Take one tablet by mouth every morning prn edema 30 tablet 1  . gabapentin (NEURONTIN) 300 MG capsule Take 1 capsule (300 mg total) by mouth 2 (two) times daily. (Patient taking differently: Take 300 mg by mouth as needed. ) 60 capsule 2  . HYDROcodone-acetaminophen (NORCO/VICODIN) 5-325 MG tablet One tablet every six hours for pain.  Limit 7 days. 28 tablet 0  . naproxen (NAPROSYN) 500 MG tablet Take 1 tablet (500 mg total) by mouth 2 (two) times daily with a meal. 60 tablet 5  . pantoprazole (PROTONIX) 40 MG tablet Take 1 tablet (40 mg total) by mouth daily. Prn acid reflux 30 tablet 1  . potassium chloride (K-DUR) 10 MEQ tablet Take one tablet by mouth every morning prn (take with lasix) 30 tablet 1  . Prenatal Vit-Fe Fumarate-FA (PNV PRENATAL PLUS MULTIVITAMIN) 27-1 MG TABS Take 1 tablet by mouth daily. 30 tablet 11   . SUMAtriptan (IMITREX) 50 MG tablet Take one tablet STAT with onset of migraine; may repeat 2 hours later prn 8 tablet 3  . topiramate (TOPAMAX) 50 MG tablet Take one tablet po q a.m. and then 2 tablets po qhs 90 tablet 5  . dicyclomine (BENTYL) 10 MG capsule Take 1 capsule (10 mg total) by mouth 3 (three) times daily as needed for spasms (or diarrhea. Do not take if constipated). 90 capsule 3   No current facility-administered medications for this visit.    Allergies as of 11/28/2019 - Review Complete 11/28/2019  Allergen Reaction Noted  . Adhesive [tape] Other (See Comments) 06/14/2015  . Lorabid [loracarbef] Hives, Swelling, and Other (See Comments) 03/28/2013  . Latex Itching and Rash 07/18/2013  . Orange fruit [citrus] Rash 07/18/2013    Family History  Problem Relation Age of Onset  . Multiple sclerosis Mother   . Heart disease Maternal Grandfather   . Heart disease Paternal Uncle   . Diabetes Paternal Uncle   . Hypertension Paternal Uncle   . Cancer Other        father's aunt had breast cancer  . Colon cancer Other  maternal great uncle, age greater than 3  . Crohn's disease Other        couple of family members on father's side of family    Social History   Socioeconomic History  . Marital status: Single    Spouse name: Not on file  . Number of children: 2  . Years of education: Not on file  . Highest education level: Not on file  Occupational History  . Occupation: out due to knee  Tobacco Use  . Smoking status: Never Smoker  . Smokeless tobacco: Never Used  Substance and Sexual Activity  . Alcohol use: No    Alcohol/week: 0.0 standard drinks  . Drug use: No  . Sexual activity: Yes    Birth control/protection: Implant  Other Topics Concern  . Not on file  Social History Narrative  . Not on file   Social Determinants of Health   Financial Resource Strain:   . Difficulty of Paying Living Expenses: Not on file  Food Insecurity:   . Worried  About Charity fundraiser in the Last Year: Not on file  . Ran Out of Food in the Last Year: Not on file  Transportation Needs:   . Lack of Transportation (Medical): Not on file  . Lack of Transportation (Non-Medical): Not on file  Physical Activity:   . Days of Exercise per Week: Not on file  . Minutes of Exercise per Session: Not on file  Stress:   . Feeling of Stress : Not on file  Social Connections:   . Frequency of Communication with Friends and Family: Not on file  . Frequency of Social Gatherings with Friends and Family: Not on file  . Attends Religious Services: Not on file  . Active Member of Clubs or Organizations: Not on file  . Attends Archivist Meetings: Not on file  . Marital Status: Not on file    Review of Systems: General: Negative for anorexia, weight loss, fever, chills, fatigue, weakness. Eyes: Negative for vision changes.  ENT: Negative for hoarseness, difficulty swallowing , nasal congestion. CV: Negative for chest pain, angina, palpitations, dyspnea on exertion, peripheral edema.  Respiratory: Negative for dyspnea at rest, dyspnea on exertion, cough, sputum, wheezing.  GI: See history of present illness. GU:  Negative for dysuria, hematuria, urinary incontinence, urinary frequency, nocturnal urination.  MS: Negative for joint pain, low back pain.  Derm: Negative for rash or itching.  Neuro: Negative for weakness, abnormal sensation, seizure, frequent headaches, memory loss, confusion.  Psych: Negative for anxiety, depression, suicidal ideation, hallucinations.  Endo: Negative for unusual weight change.  Heme: Negative for bruising or bleeding. Allergy: Negative for rash or hives.  Physical Exam: Note: limited exam due to virtual visit General:   Alert and oriented. Pleasant and cooperative. Well-nourished and well-developed.  Head:  Normocephalic and atraumatic. Eyes:  Without icterus, sclera clear and conjunctiva pink.  Ears:  Normal  auditory acuity. Skin:  Intact without facial significant lesions or rashes. Neurologic:  Alert and oriented x4;  grossly normal neurologically. Psych:  Alert and cooperative. Normal mood and affect. Heme/Lymph/Immune: No excessive bruising noted.

## 2019-11-28 NOTE — Patient Instructions (Signed)
Your health issues we discussed today were:   Abdominal pain with nausea: 1. Increase your Protonix to 40 mg twice daily.  Take this for sing in the morning before eating and 30 minutes before your last meal today 2. Call us in 1 to 2 weeks and let us know if this is helping your upper abdominal pain 3. You should avoid all NSAIDs (ibuprofen, Motrin, Advil, Aleve, naproxen, Naprosyn, aspirin, Goody's powders, BC powders, anything with "NSAID" on the bottle). 4. You can try Tylenol instead.  Limit Tylenol to 3000 mg (six 500 mg extra strength Tylenol) a day. 5. Call us if you see any black stools or have worsening or severe symptoms  Fecal urgency after eating/loose stools: 1. I am sending in Bentyl 10 mg to your pharmacy.  You can use this up to 3 times a day as needed for cramping with loose stools 2. Do not take Bentyl if you are having constipation 3. Call us if you have any worsening or severe symptoms  Overall I recommend:  1. Continue your other current medications 2. Return for follow-up in 4 to 6 weeks 3. Call us if you have any questions or concerns.   Because of recent events of COVID-19 ("Coronavirus"), follow CDC recommendations:  Wash your hand frequently Avoid touching your face Stay away from people who are sick If you have symptoms such as fever, cough, shortness of breath then call your healthcare provider for further guidance If you are sick, STAY AT HOME unless otherwise directed by your healthcare provider. Follow directions from state and national officials regarding staying safe   At [facility] we value your feedback. You may receive a survey about your visit today. Please share your experience as we strive to create trusting relationships with our patients to provide genuine, compassionate, quality care.  We appreciate your understanding and patience as we review any laboratory studies, imaging, and other diagnostic tests that are ordered as we  care for you. Our office policy is 5 business days for review of these results, and any emergent or urgent results are addressed in a timely manner for your best interest. If you do not hear from our office in 1 week, please contact us.   We also encourage the use of MyChart, which contains your medical information for your review as well. If you are not enrolled in this feature, an access code is on this after visit summary for your convenience. Thank you for allowing [place] to be involved in your care.  It was great to see you today!  I hope you have a Merry Christmas and Happy Holidays!!

## 2019-11-28 NOTE — Assessment & Plan Note (Signed)
She has had some worsening epigastric abdominal pain described as sharp as well as associated nausea intermittently.  I feel GERD is likely contributing to her symptoms.  Tends to be worse postprandial, has had worsening with greasy or fatty foods.  She is status post cholecystectomy and this is going well she feels.  I will increase her Protonix to 40 mg twice daily.  I recommended she continue to take the medication she has, as she has a decent supply on hand.  Call us when she runs out or is near running out we can refill for twice daily dosing.  Requested progress report in 1 to 2 weeks.  I have also recommended she avoid all NSAIDs and attempted Tylenol for knee pain, which she typically takes naproxen about 3 times a week.  This could be contributing to her symptoms.  Follow-up in 4 to 6 weeks.

## 2019-12-14 ENCOUNTER — Encounter: Payer: Self-pay | Admitting: Orthopaedic Surgery

## 2019-12-14 ENCOUNTER — Ambulatory Visit: Payer: PRIVATE HEALTH INSURANCE | Admitting: Orthopaedic Surgery

## 2019-12-19 ENCOUNTER — Other Ambulatory Visit: Payer: Self-pay | Admitting: *Deleted

## 2019-12-19 ENCOUNTER — Encounter: Payer: Self-pay | Admitting: Family Medicine

## 2019-12-19 MED ORDER — TIZANIDINE HCL 4 MG PO TABS: ORAL_TABLET | ORAL | 1 refills | Status: DC

## 2019-12-19 NOTE — Telephone Encounter (Signed)
Called pt and tried to get her to schedule virtual appt. She states she can't today because she is working and her lunch break is at 12. States she could tomorrow. Having headache since Sunday. Feels like a band is squeezing her head. Pain is worse behind right eye. Taking topamax bid and excedrin migraine bid.  [facility]

## 2019-12-19 NOTE — Telephone Encounter (Signed)
Nurses She can try Zanaflex 4 mg 1 every 6 hours as needed headache in addition to her other usual medicines.  Zanaflex is a muscle relaxer it should not be used when she is away from home May have a prescription for Zanaflex 4 mg, 1 taken every 6 hours as needed for tension headache, #15 with 1 refill If ongoing headaches I would recommend a office visit

## 2019-12-19 NOTE — Telephone Encounter (Signed)
Called and discussed with pt. She verbalized understanding and med sent to pharm.

## 2019-12-27 ENCOUNTER — Encounter: Payer: Self-pay | Admitting: Advanced Practice Midwife

## 2019-12-27 ENCOUNTER — Other Ambulatory Visit: Payer: Self-pay | Admitting: Adult Health

## 2019-12-27 MED ORDER — FLUCONAZOLE 150 MG PO TABS: ORAL_TABLET | ORAL | 1 refills | Status: DC

## 2019-12-27 NOTE — Progress Notes (Signed)
Will rx diflcuan ?

## 2020-01-02 ENCOUNTER — Encounter: Payer: Self-pay | Admitting: Internal Medicine

## 2020-01-02 ENCOUNTER — Ambulatory Visit: Payer: PRIVATE HEALTH INSURANCE | Admitting: Orthopaedic Surgery

## 2020-01-02 ENCOUNTER — Ambulatory Visit: Payer: PRIVATE HEALTH INSURANCE | Admitting: Nurse Practitioner

## 2020-01-02 NOTE — Progress Notes (Deleted)
Referring Provider: Kathyrn Drown, MD Primary Care Physician:  Kathyrn Drown, MD Primary GI:  Dr.   Rayne Du chief complaint on file.   HPI:   Melissa Livingston is a 24 y.o. female who presents for follow-up on IBS and GERD.  The patient was last seen in our office 11/28/2019 for IBS, GERD, upper abdominal pain.  Her last visit was a virtual office visit due to COVID-19/coronavirus pandemic.  Noted history of chronic abdominal pain, rectal bleeding, GERD.  Colonoscopy and EGD recently updated with internal hemorrhoids, otherwise normal colonoscopy.  EGD essentially normal.  IBS managed with Linzess 290 mcg taken as needed.  HIDA scan on file with EF of 42%.  Pain is associated with eating.  Recommended maximizing bowel regimen prior to referral to general surgery.  Underwent laparoscopic cholecystectomy on 03/07/2018 doing well postoperative  At her last visit she noted persistent epigastric pain associated with eating; concomitant nausea and less frequently vomiting.  Previous constipation but currently postprandial urgency and loose stools consistent with [place] 6 about 2-4 times a day.  Significant improvement in pain after a bowel movement.  Scant toilet tissue hematochezia only when significantly constipated.  On Protonix daily.  Current use of NSAIDs at least 3 times a week.  Recommended increase Protonix to twice daily, progress report in 1 to 2 weeks, avoid all NSAIDs, Bentyl 10 mg up to 3 times a day as needed for abdominal cramping and loose stools, follow-up in 4 to 6 weeks.  No progress report as requested.  Today she states  Past Medical History:  Diagnosis Date  . Anemia   . Anxiety   . Asthma    as child  . Endometritis 01/11/2015  . GERD (gastroesophageal reflux disease)   . History of ovarian cyst 01/28/2015  . IBS (irritable bowel syndrome)   . Nausea and vomiting 01/11/2015  . Ovarian cyst 01/14/2015  . Pelvic pain in female 01/11/2015  . Pregnancy induced hypertension    with  pregnancy; no meds now.  . Pregnant 02/05/2016    Past Surgical History:  Procedure Laterality Date  . CESAREAN SECTION N/A 09/29/2014   Procedure: CESAREAN SECTION;  Surgeon: Jonnie Kind, MD;  Location: [facility];  Service: Obstetrics;  Laterality: N/A;  . CESAREAN SECTION N/A 09/25/2016   Procedure: CESAREAN SECTION;  Surgeon: Jonnie Kind, MD;  Location: [facility];  Service: Obstetrics;  Laterality: N/A;  . CHOLECYSTECTOMY N/A 03/07/2018   Procedure: LAPAROSCOPIC CHOLECYSTECTOMY;  Surgeon: Aviva Signs, MD;  Location: [facility];  Service: General;  Laterality: N/A;  . COLONOSCOPY WITH PROPOFOL N/A 08/02/2017   Dr. Gala Romney: moderate internal hemorhoids, distal 5 cm of TI also appeared normal  . ESOPHAGOGASTRODUODENOSCOPY (EGD) WITH ESOPHAGEAL DILATION N/A 07/19/2013   Dr. Rourk:normal appearing esophagus s/p dilation, normal D1 and D2, unremarkable path   . ESOPHAGOGASTRODUODENOSCOPY (EGD) WITH PROPOFOL N/A 08/02/2017   Normal esophagus, small hiatal hernia, normal duodenum  . none    . WISDOM TOOTH EXTRACTION      Current Outpatient Medications  Medication Sig Dispense Refill  . acetaminophen (TYLENOL) 325 MG tablet Take 650 mg by mouth every 6 (six) hours as needed for mild pain, moderate pain or headache.     . dicyclomine (BENTYL) 10 MG capsule Take 1 capsule (10 mg total) by mouth 3 (three) times daily as needed for spasms (or diarrhea. Do not take if constipated). 90 capsule 3  . fluconazole (DIFLUCAN) 150 MG tablet Take 1 now  and repeat 1 in 3 days 2 tablet 1  . furosemide (LASIX) 20 MG tablet Take one tablet by mouth every morning prn edema 30 tablet 1  . gabapentin (NEURONTIN) 300 MG capsule Take 1 capsule (300 mg total) by mouth 2 (two) times daily. (Patient taking differently: Take 300 mg by mouth as needed. ) 60 capsule 2  . HYDROcodone-acetaminophen (NORCO/VICODIN) 5-325 MG tablet One tablet every six hours for pain.  Limit 7 days. 28 tablet 0  . naproxen (NAPROSYN) 500  MG tablet Take 1 tablet (500 mg total) by mouth 2 (two) times daily with a meal. 60 tablet 5  . pantoprazole (PROTONIX) 40 MG tablet Take 1 tablet (40 mg total) by mouth daily. Prn acid reflux 30 tablet 1  . potassium chloride (K-DUR) 10 MEQ tablet Take one tablet by mouth every morning prn (take with lasix) 30 tablet 1  . Prenatal Vit-Fe Fumarate-FA (PNV PRENATAL PLUS MULTIVITAMIN) 27-1 MG TABS Take 1 tablet by mouth daily. 30 tablet 11  . SUMAtriptan (IMITREX) 50 MG tablet Take one tablet STAT with onset of migraine; may repeat 2 hours later prn 8 tablet 3  . tiZANidine (ZANAFLEX) 4 MG tablet Take one tablet every 6 hours prn headache. Home use only 15 tablet 1  . topiramate (TOPAMAX) 50 MG tablet Take one tablet po q a.m. and then 2 tablets po qhs 90 tablet 5   No current facility-administered medications for this visit.    Allergies as of 01/02/2020 - Review Complete 11/28/2019  Allergen Reaction Noted  . Adhesive [tape] Other (See Comments) 06/14/2015  . Lorabid [loracarbef] Hives, Swelling, and Other (See Comments) 03/28/2013  . Latex Itching and Rash 07/18/2013  . Orange fruit [citrus] Rash 07/18/2013    Family History  Problem Relation Age of Onset  . Multiple sclerosis Mother   . Heart disease Maternal Grandfather   . Heart disease Paternal Uncle   . Diabetes Paternal Uncle   . Hypertension Paternal Uncle   . Cancer Other        father's aunt had breast cancer  . Colon cancer Other        maternal great uncle, age greater than 50  . Crohn's disease Other        couple of family members on father's side of family    Social History   Socioeconomic History  . Marital status: Single    Spouse name: Not on file  . Number of children: 2  . Years of education: Not on file  . Highest education level: Not on file  Occupational History  . Occupation: out due to knee  Tobacco Use  . Smoking status: Never Smoker  . Smokeless tobacco: Never Used  Substance and Sexual  Activity  . Alcohol use: No    Alcohol/week: 0.0 standard drinks  . Drug use: No  . Sexual activity: Yes    Birth control/protection: Implant  Other Topics Concern  . Not on file  Social History Narrative  . Not on file   Social Determinants of Health   Financial Resource Strain:   . Difficulty of Paying Living Expenses: Not on file  Food Insecurity:   . Worried About Charity fundraiser in the Last Year: Not on file  . Ran Out of Food in the Last Year: Not on file  Transportation Needs:   . Lack of Transportation (Medical): Not on file  . Lack of Transportation (Non-Medical): Not on file  Physical Activity:   . Days  of Exercise per Week: Not on file  . Minutes of Exercise per Session: Not on file  Stress:   . Feeling of Stress : Not on file  Social Connections:   . Frequency of Communication with Friends and Family: Not on file  . Frequency of Social Gatherings with Friends and Family: Not on file  . Attends Religious Services: Not on file  . Active Member of Clubs or Organizations: Not on file  . Attends Archivist Meetings: Not on file  . Marital Status: Not on file    Review of Systems: General: Negative for anorexia, weight loss, fever, chills, fatigue, weakness. Eyes: Negative for vision changes.  ENT: Negative for hoarseness, difficulty swallowing , nasal congestion. CV: Negative for chest pain, angina, palpitations, dyspnea on exertion, peripheral edema.  Respiratory: Negative for dyspnea at rest, dyspnea on exertion, cough, sputum, wheezing.  GI: See history of present illness. GU:  Negative for dysuria, hematuria, urinary incontinence, urinary frequency, nocturnal urination.  MS: Negative for joint pain, low back pain.  Derm: Negative for rash or itching.  Neuro: Negative for weakness, abnormal sensation, seizure, frequent headaches, memory loss, confusion.  Psych: Negative for anxiety, depression, suicidal ideation, hallucinations.  Endo: Negative  for unusual weight change.  Heme: Negative for bruising or bleeding. Allergy: Negative for rash or hives.   Physical Exam: There were no vitals taken for this visit. General:   Alert and oriented. Pleasant and cooperative. Well-nourished and well-developed.  Head:  Normocephalic and atraumatic. Eyes:  Without icterus, sclera clear and conjunctiva pink.  Ears:  Normal auditory acuity. Mouth:  No deformity or lesions, oral mucosa pink.  Throat/Neck:  Supple, without mass or thyromegaly. Cardiovascular:  S1, S2 present without murmurs appreciated. Normal pulses noted. Extremities without clubbing or edema. Respiratory:  Clear to auscultation bilaterally. No wheezes, rales, or rhonchi. No distress.  Gastrointestinal:  +BS, soft, non-tender and non-distended. No HSM noted. No guarding or rebound. No masses appreciated.  Rectal:  Deferred  Musculoskalatal:  Symmetrical without gross deformities. Normal posture. Skin:  Intact without significant lesions or rashes. Neurologic:  Alert and oriented x4;  grossly normal neurologically. Psych:  Alert and cooperative. Normal mood and affect. Heme/Lymph/Immune: No significant cervical adenopathy. No excessive bruising noted.    01/02/2020 8:11 AM   Disclaimer: This note was dictated with voice recognition software. Similar sounding words can inadvertently be transcribed and may not be corrected upon review.

## 2020-01-05 ENCOUNTER — Other Ambulatory Visit: Payer: Self-pay

## 2020-01-05 ENCOUNTER — Ambulatory Visit (INDEPENDENT_AMBULATORY_CARE_PROVIDER_SITE_OTHER): Payer: 59 | Admitting: Family Medicine

## 2020-01-05 ENCOUNTER — Telehealth: Payer: Self-pay | Admitting: Family Medicine

## 2020-01-05 DIAGNOSIS — R030 Elevated blood-pressure reading, without diagnosis of hypertension: Secondary | ICD-10-CM | POA: Diagnosis not present

## 2020-01-05 NOTE — Progress Notes (Signed)
   Subjective:  Audiovideo  Patient ID: Melissa Livingston, female    DOB: 07-Sep-1996, 24 y.o.   MRN: [id number]  HPIbp has been elevated. Pt states she feels off and no like herself. headache on left side of head and down into neck. Having some nausea. Started 2 days ago pt checked bp on Wednesday and it was  145-95, and yesterday it was 151/97, and today 146/95.   Virtual Visit via Video Note  I connected with [place] on 01/05/20 at 11:00 AM EST by a video enabled telemedicine application and verified that I am speaking with the correct person using two identifiers.  Location: Patient: home Provider: office   I discussed the limitations of evaluation and management by telemedicine and the availability of in person appointments. The patient expressed understanding and agreed to proceed.  History of Present Illness:    Observations/Objective:   Assessment and Plan:   Follow Up Instructions:    I discussed the assessment and treatment plan with the patient. The patient was provided an opportunity to ask questions and all were answered. The patient agreed with the plan and demonstrated an understanding of the instructions.   The patient was advised to call back or seek an in-person evaluation if the symptoms worsen or if the condition fails to improve as anticipated.  I provided 20 minutes of non-face-to-face time during this encounter.  Patient did experience elevated blood pressure with pregnancy.  Headache and neck pain for 2 days duration.  Primarily left-sided.  Worse with certain movements.  Has had tension headaches and neck strains in the past.  Also gets migraine headaches.  Topamax overall is helping  Exercise not a lot these days due to work in advance    Review of Systems No chest pain no shortness of breath abdominal pain    Objective:   Physical Exam   Virtual     Assessment & Plan:  Impression elevated blood pressure 2 days duration.  Advised patient we  let this go for many months before intervening with medication.  Local measures discussed.  Salt intake discussed.  #2 lateral neck strain/tension headache also add 2 Aleve twice a day and Zanaflex she already has an opening muscle strain tension component to the headache.  Symptom care discussed.

## 2020-01-05 NOTE — Telephone Encounter (Signed)
Patient had a phone visit this morning with Dr. Richardson Landry and would like a work note starting today through the weekend and returning to work on Monday.

## 2020-01-05 NOTE — Telephone Encounter (Signed)
Please give patient requested work note. Thank you

## 2020-01-05 NOTE — Telephone Encounter (Signed)
Please advise. Thank you

## 2020-01-05 NOTE — Telephone Encounter (Signed)
sure

## 2020-01-08 ENCOUNTER — Encounter: Payer: Self-pay | Admitting: Family Medicine

## 2020-01-08 ENCOUNTER — Ambulatory Visit: Payer: PRIVATE HEALTH INSURANCE | Admitting: Nurse Practitioner

## 2020-01-08 ENCOUNTER — Encounter: Payer: Self-pay | Admitting: Internal Medicine

## 2020-01-08 ENCOUNTER — Telehealth: Payer: Self-pay | Admitting: Nurse Practitioner

## 2020-01-08 NOTE — Telephone Encounter (Signed)
Work note in [employer]

## 2020-01-08 NOTE — Progress Notes (Deleted)
Referring Provider: Kathyrn Drown, MD Primary Care Physician:  Kathyrn Drown, MD Primary GI:  Dr.   Rayne Du chief complaint on file.   HPI:   Melissa Livingston is a 24 y.o. female who presents for follow-up on IBS and GERD.  Patient was last seen in our office 11/28/2019 for the same as well as upper abdominal pain.  Her last visit was a virtual office visit due to COVID-19/coronavirus pandemic.  History of chronic abdominal pain, rectal bleeding, GERD.  Colonoscopy and EGD up-to-date with essentially normal EGD and noted internal hemorrhoids, otherwise normal colonoscopy.  Previous trial of Linzess 290 mcg as needed.  HIDA scan on file with EF of 42%.  Her pain is generally worse with eating.  She underwent laparoscopic cholecystectomy on 03/07/2018 with good postop period.  At her last visit persistent epigastric pain somewhat worse, triggered by eating and associated with nausea and rare vomiting.  Most recent episode of vomiting after eating fried chicken which she admits she should not have done.  Recently with diarrhea 2-4 times a day, recent stools essentially [place] 6.  Some improvement after bowel movement but no resolution.  Scant toilet tissue hematochezia only if constipated.  On Protonix.  No other overt GI complaints.  Of note, she is on naproxen about 3 times a week for knee pain.  Recommended increase Protonix to 40 mg twice daily, progress report in 1 to 2 weeks, avoid NSAIDs, notify us of any bleeding or melena.  Bentyl 10 mg up to 3 times a day for cramping or loose stools and hold for constipation.  Follow-up in 4 to 6 weeks.  Today she states   Past Medical History:  Diagnosis Date  . Anemia   . Anxiety   . Asthma    as child  . Endometritis 01/11/2015  . GERD (gastroesophageal reflux disease)   . History of ovarian cyst 01/28/2015  . IBS (irritable bowel syndrome)   . Nausea and vomiting 01/11/2015  . Ovarian cyst 01/14/2015  . Pelvic pain in female 01/11/2015  . Pregnancy  induced hypertension    with pregnancy; no meds now.  . Pregnant 02/05/2016    Past Surgical History:  Procedure Laterality Date  . CESAREAN SECTION N/A 09/29/2014   Procedure: CESAREAN SECTION;  Surgeon: Jonnie Kind, MD;  Location: [facility];  Service: Obstetrics;  Laterality: N/A;  . CESAREAN SECTION N/A 09/25/2016   Procedure: CESAREAN SECTION;  Surgeon: Jonnie Kind, MD;  Location: [facility];  Service: Obstetrics;  Laterality: N/A;  . CHOLECYSTECTOMY N/A 03/07/2018   Procedure: LAPAROSCOPIC CHOLECYSTECTOMY;  Surgeon: Aviva Signs, MD;  Location: [facility];  Service: General;  Laterality: N/A;  . COLONOSCOPY WITH PROPOFOL N/A 08/02/2017   Dr. Gala Romney: moderate internal hemorhoids, distal 5 cm of TI also appeared normal  . ESOPHAGOGASTRODUODENOSCOPY (EGD) WITH ESOPHAGEAL DILATION N/A 07/19/2013   Dr. Rourk:normal appearing esophagus s/p dilation, normal D1 and D2, unremarkable path   . ESOPHAGOGASTRODUODENOSCOPY (EGD) WITH PROPOFOL N/A 08/02/2017   Normal esophagus, small hiatal hernia, normal duodenum  . none    . WISDOM TOOTH EXTRACTION      Current Outpatient Medications  Medication Sig Dispense Refill  . acetaminophen (TYLENOL) 325 MG tablet Take 650 mg by mouth every 6 (six) hours as needed for mild pain, moderate pain or headache.     . dicyclomine (BENTYL) 10 MG capsule Take 1 capsule (10 mg total) by mouth 3 (three) times daily as needed for spasms (  or diarrhea. Do not take if constipated). 90 capsule 3  . furosemide (LASIX) 20 MG tablet Take one tablet by mouth every morning prn edema 30 tablet 1  . gabapentin (NEURONTIN) 300 MG capsule Take 1 capsule (300 mg total) by mouth 2 (two) times daily. (Patient taking differently: Take 300 mg by mouth as needed. ) 60 capsule 2  . HYDROcodone-acetaminophen (NORCO/VICODIN) 5-325 MG tablet One tablet every six hours for pain.  Limit 7 days. 28 tablet 0  . pantoprazole (PROTONIX) 40 MG tablet Take 1 tablet (40 mg total) by mouth  daily. Prn acid reflux 30 tablet 1  . potassium chloride (K-DUR) 10 MEQ tablet Take one tablet by mouth every morning prn (take with lasix) 30 tablet 1  . SUMAtriptan (IMITREX) 50 MG tablet Take one tablet STAT with onset of migraine; may repeat 2 hours later prn 8 tablet 3  . tiZANidine (ZANAFLEX) 4 MG tablet Take one tablet every 6 hours prn headache. Home use only 15 tablet 1  . topiramate (TOPAMAX) 50 MG tablet Take one tablet po q a.m. and then 2 tablets po qhs 90 tablet 5   No current facility-administered medications for this visit.    Allergies as of 01/08/2020 - Review Complete 01/05/2020  Allergen Reaction Noted  . Adhesive [tape] Other (See Comments) 06/14/2015  . Lorabid [loracarbef] Hives, Swelling, and Other (See Comments) 03/28/2013  . Latex Itching and Rash 07/18/2013  . Orange fruit [citrus] Rash 07/18/2013    Family History  Problem Relation Age of Onset  . Multiple sclerosis Mother   . Heart disease Maternal Grandfather   . Heart disease Paternal Uncle   . Diabetes Paternal Uncle   . Hypertension Paternal Uncle   . Cancer Other        father's aunt had breast cancer  . Colon cancer Other        maternal great uncle, age greater than 3  . Crohn's disease Other        couple of family members on father's side of family    Social History   Socioeconomic History  . Marital status: Single    Spouse name: Not on file  . Number of children: 2  . Years of education: Not on file  . Highest education level: Not on file  Occupational History  . Occupation: out due to knee  Tobacco Use  . Smoking status: Never Smoker  . Smokeless tobacco: Never Used  Substance and Sexual Activity  . Alcohol use: No    Alcohol/week: 0.0 standard drinks  . Drug use: No  . Sexual activity: Yes    Birth control/protection: Implant  Other Topics Concern  . Not on file  Social History Narrative  . Not on file   Social Determinants of Health   Financial Resource Strain:   .  Difficulty of Paying Living Expenses: Not on file  Food Insecurity:   . Worried About Charity fundraiser in the Last Year: Not on file  . Ran Out of Food in the Last Year: Not on file  Transportation Needs:   . Lack of Transportation (Medical): Not on file  . Lack of Transportation (Non-Medical): Not on file  Physical Activity:   . Days of Exercise per Week: Not on file  . Minutes of Exercise per Session: Not on file  Stress:   . Feeling of Stress : Not on file  Social Connections:   . Frequency of Communication with Friends and Family: Not on file  .  Frequency of Social Gatherings with Friends and Family: Not on file  . Attends Religious Services: Not on file  . Active Member of Clubs or Organizations: Not on file  . Attends Archivist Meetings: Not on file  . Marital Status: Not on file    Review of Systems: General: Negative for anorexia, weight loss, fever, chills, fatigue, weakness. Eyes: Negative for vision changes.  ENT: Negative for hoarseness, difficulty swallowing , nasal congestion. CV: Negative for chest pain, angina, palpitations, dyspnea on exertion, peripheral edema.  Respiratory: Negative for dyspnea at rest, dyspnea on exertion, cough, sputum, wheezing.  GI: See history of present illness. GU:  Negative for dysuria, hematuria, urinary incontinence, urinary frequency, nocturnal urination.  MS: Negative for joint pain, low back pain.  Derm: Negative for rash or itching.  Neuro: Negative for weakness, abnormal sensation, seizure, frequent headaches, memory loss, confusion.  Psych: Negative for anxiety, depression, suicidal ideation, hallucinations.  Endo: Negative for unusual weight change.  Heme: Negative for bruising or bleeding. Allergy: Negative for rash or hives.   Physical Exam: There were no vitals taken for this visit. General:   Alert and oriented. Pleasant and cooperative. Well-nourished and well-developed.  Head:  Normocephalic and  atraumatic. Eyes:  Without icterus, sclera clear and conjunctiva pink.  Ears:  Normal auditory acuity. Mouth:  No deformity or lesions, oral mucosa pink.  Throat/Neck:  Supple, without mass or thyromegaly. Cardiovascular:  S1, S2 present without murmurs appreciated. Normal pulses noted. Extremities without clubbing or edema. Respiratory:  Clear to auscultation bilaterally. No wheezes, rales, or rhonchi. No distress.  Gastrointestinal:  +BS, soft, non-tender and non-distended. No HSM noted. No guarding or rebound. No masses appreciated.  Rectal:  Deferred  Musculoskalatal:  Symmetrical without gross deformities. Normal posture. Skin:  Intact without significant lesions or rashes. Neurologic:  Alert and oriented x4;  grossly normal neurologically. Psych:  Alert and cooperative. Normal mood and affect. Heme/Lymph/Immune: No significant cervical adenopathy. No excessive bruising noted.    01/08/2020 8:12 AM   Disclaimer: This note was dictated with voice recognition software. Similar sounding words can inadvertently be transcribed and may not be corrected upon review.

## 2020-01-08 NOTE — Telephone Encounter (Signed)
PATIENT WAS A NO SHOW AND LETTER SENT  °

## 2020-01-09 ENCOUNTER — Encounter: Payer: Self-pay | Admitting: *Deleted

## 2020-01-09 ENCOUNTER — Other Ambulatory Visit: Payer: Self-pay

## 2020-01-09 ENCOUNTER — Ambulatory Visit (INDEPENDENT_AMBULATORY_CARE_PROVIDER_SITE_OTHER): Payer: PRIVATE HEALTH INSURANCE | Admitting: *Deleted

## 2020-01-09 VITALS — BP 148/76 | HR 124 | Ht 63.5 in | Wt 228.0 lb

## 2020-01-09 DIAGNOSIS — Z3201 Encounter for pregnancy test, result positive: Secondary | ICD-10-CM

## 2020-01-09 LAB — POCT URINE PREGNANCY: Preg Test, Ur: POSITIVE — AB

## 2020-01-09 MED ORDER — PROMETHAZINE HCL 25 MG PO TABS: 25.0000 mg | ORAL_TABLET | Freq: Four times a day (QID) | ORAL | 1 refills | Status: DC | PRN

## 2020-01-09 NOTE — Addendum Note (Signed)
Addended by: Derrek Monaco A on: 01/09/2020 04:30 PM   Modules accepted: Orders

## 2020-01-09 NOTE — Progress Notes (Signed)
Chart reviewed for nurse visit. Agree with plan of care. Will rx phenergan Estill Dooms, NP 01/09/2020 4:30 PM

## 2020-01-09 NOTE — Progress Notes (Signed)
   NURSE VISIT- PREGNANCY CONFIRMATION   SUBJECTIVE:  Melissa Livingston is a 24 y.o. G22P2002 female at [redacted]w[redacted]d by uncertain LMP of Patient's last menstrual period was 11/26/2019. Here for pregnancy confirmation.  Home pregnancy test: positive x two  She reports nausea.  She is taking prenatal vitamins.    OBJECTIVE:  BP (!) 148/76 (BP Location: Right Arm, Patient Position: Sitting, Cuff Size: Normal)   Pulse (!) 124   Ht 5' 3.5" (1.613 m)   Wt 228 lb (103.4 kg)   LMP 11/26/2019   BMI 39.75 kg/m   Appears well, in no apparent distress OB History  Gravida Para Term Preterm AB Living  3 2 2     2   SAB TAB Ectopic Multiple Live Births        0 2    # Outcome Date GA Lbr Len/2nd Weight Sex Delivery Anes PTL Lv  3 Current           2 Term 09/25/16 [redacted]w[redacted]d  6 lb 7.7 oz (2.94 kg) F CS-Vac Spinal  LIV     Birth Comments: No gross anomalies noted  1 Term 09/29/14 [redacted]w[redacted]d 80:34 / 03:58 7 lb 4.8 oz (3.311 kg) F CS-LTranv EPI N LIV    Results for orders placed or performed in visit on 01/09/20 (from the past 24 hour(s))  POCT urine pregnancy   Collection Time: 01/09/20  3:53 PM  Result Value Ref Range   Preg Test, Ur Positive (A) Negative    ASSESSMENT: Positive pregnancy test, [redacted]w[redacted]d by LMP    PLAN: Schedule for dating ultrasound in 2 weeks. Prenatal vitamins: continue   Nausea medicines: requested-note routed to Derrek Monaco, NP to send prescription   OB packet given: Yes  Rolena Infante  01/09/2020 3:53 PM

## 2020-01-18 ENCOUNTER — Encounter: Payer: Self-pay | Admitting: Orthopaedic Surgery

## 2020-01-18 ENCOUNTER — Ambulatory Visit: Payer: PRIVATE HEALTH INSURANCE | Admitting: Orthopaedic Surgery

## 2020-01-23 ENCOUNTER — Other Ambulatory Visit: Payer: PRIVATE HEALTH INSURANCE

## 2020-01-24 ENCOUNTER — Other Ambulatory Visit: Payer: PRIVATE HEALTH INSURANCE

## 2020-01-30 ENCOUNTER — Ambulatory Visit: Payer: PRIVATE HEALTH INSURANCE | Attending: Internal Medicine

## 2020-01-30 ENCOUNTER — Other Ambulatory Visit: Payer: Self-pay

## 2020-01-30 DIAGNOSIS — Z20822 Contact with and (suspected) exposure to covid-19: Secondary | ICD-10-CM

## 2020-01-31 ENCOUNTER — Encounter: Payer: Self-pay | Admitting: *Deleted

## 2020-01-31 ENCOUNTER — Other Ambulatory Visit: Payer: PRIVATE HEALTH INSURANCE

## 2020-01-31 ENCOUNTER — Telehealth: Payer: Self-pay | Admitting: *Deleted

## 2020-01-31 LAB — NOVEL CORONAVIRUS, NAA: SARS-CoV-2, NAA: NOT DETECTED

## 2020-01-31 NOTE — Telephone Encounter (Signed)
Pt left message that she is having problems with constipation. Needs advice.

## 2020-01-31 NOTE — Telephone Encounter (Signed)
Patient states she is having problems with constipation. Only goes about every 4 days.  Constipation relief measures sent.  Encouraged to get outside and walk as this may help get things moving too.  Pt verbalized understanding and agreeable to plan.

## 2020-02-07 ENCOUNTER — Other Ambulatory Visit: Payer: Self-pay | Admitting: Obstetrics & Gynecology

## 2020-02-07 DIAGNOSIS — O3680X0 Pregnancy with inconclusive fetal viability, not applicable or unspecified: Secondary | ICD-10-CM

## 2020-02-08 ENCOUNTER — Other Ambulatory Visit: Payer: Self-pay

## 2020-02-08 ENCOUNTER — Ambulatory Visit (INDEPENDENT_AMBULATORY_CARE_PROVIDER_SITE_OTHER): Payer: PRIVATE HEALTH INSURANCE

## 2020-02-08 DIAGNOSIS — O3680X0 Pregnancy with inconclusive fetal viability, not applicable or unspecified: Secondary | ICD-10-CM

## 2020-02-08 DIAGNOSIS — Z3A10 10 weeks gestation of pregnancy: Secondary | ICD-10-CM | POA: Diagnosis not present

## 2020-02-08 NOTE — Progress Notes (Signed)
[place] 10+4 wks,single IUP,CRL 33.56 mm,fhr 179 bpm,normal right ovary,septated left corpus luteal cyst 4.1 x 3.3 x 2.7 cm

## 2020-02-21 ENCOUNTER — Other Ambulatory Visit: Payer: Self-pay | Admitting: Obstetrics and Gynecology

## 2020-02-21 DIAGNOSIS — Z3682 Encounter for antenatal screening for nuchal translucency: Secondary | ICD-10-CM

## 2020-02-22 ENCOUNTER — Other Ambulatory Visit: Payer: Self-pay

## 2020-02-22 ENCOUNTER — Ambulatory Visit (INDEPENDENT_AMBULATORY_CARE_PROVIDER_SITE_OTHER): Payer: PRIVATE HEALTH INSURANCE

## 2020-02-22 ENCOUNTER — Ambulatory Visit (INDEPENDENT_AMBULATORY_CARE_PROVIDER_SITE_OTHER): Payer: PRIVATE HEALTH INSURANCE | Admitting: Advanced Practice Midwife

## 2020-02-22 ENCOUNTER — Ambulatory Visit: Payer: PRIVATE HEALTH INSURANCE | Admitting: *Deleted

## 2020-02-22 ENCOUNTER — Encounter: Payer: Self-pay | Admitting: Advanced Practice Midwife

## 2020-02-22 VITALS — Wt 225.0 lb

## 2020-02-22 DIAGNOSIS — O0990 Supervision of high risk pregnancy, unspecified, unspecified trimester: Secondary | ICD-10-CM | POA: Insufficient documentation

## 2020-02-22 DIAGNOSIS — Z3480 Encounter for supervision of other normal pregnancy, unspecified trimester: Secondary | ICD-10-CM

## 2020-02-22 DIAGNOSIS — Z3682 Encounter for antenatal screening for nuchal translucency: Secondary | ICD-10-CM | POA: Diagnosis not present

## 2020-02-22 DIAGNOSIS — Z3A12 12 weeks gestation of pregnancy: Secondary | ICD-10-CM

## 2020-02-22 LAB — POCT URINALYSIS DIPSTICK OB
Blood, UA: NEGATIVE
Glucose, UA: NEGATIVE
Ketones, UA: NEGATIVE
Leukocytes, UA: NEGATIVE
Nitrite, UA: NEGATIVE
POC,PROTEIN,UA: NEGATIVE

## 2020-02-22 MED ORDER — ASPIRIN EC 81 MG PO TBEC: 162.0000 mg | DELAYED_RELEASE_TABLET | Freq: Every day | ORAL | 6 refills | Status: DC

## 2020-02-22 MED ORDER — OMEPRAZOLE 20 MG PO CPDR: 20.0000 mg | DELAYED_RELEASE_CAPSULE | Freq: Every day | ORAL | 6 refills | Status: DC

## 2020-02-22 NOTE — Progress Notes (Signed)
INITIAL OBSTETRICAL VISIT Patient name: Melissa Livingston MRN [id number]  Date of birth: Jul 27, 1996 Chief Complaint:   Initial Prenatal Visit  History of Present Illness:   Melissa Livingston is a 24 y.o. G92P2002 African American female at [redacted]w[redacted]d by LMP/12 week [place] with an Estimated Date of Delivery: 09/01/20 being seen today for her initial obstetrical visit.   Her obstetrical history is significant for GHTN and CS X2. .   Today she reports HA, acid reflux.  Patient's last menstrual period was 11/26/2019. Last pap 06/05/19/. Results were: normal Review of Systems:   Pertinent items are noted in HPI Denies cramping/contractions, leakage of fluid, vaginal bleeding, abnormal vaginal discharge w/ itching/odor/irritation, headaches, visual changes, shortness of breath, chest pain, abdominal pain, severe nausea/vomiting, or problems with urination or bowel movements unless otherwise stated above.  Pertinent History Reviewed:  Reviewed past medical,surgical, social, obstetrical and family history.  Reviewed problem list, medications and allergies. OB History  Gravida Para Term Preterm AB Living  3 2 2     2   SAB TAB Ectopic Multiple Live Births        0 2    # Outcome Date GA Lbr Len/2nd Weight Sex Delivery Anes PTL Lv  3 Current           2 Term 09/25/16 [redacted]w[redacted]d  6 lb 7.7 oz (2.94 kg) F CS-Vac Spinal  LIV     Birth Comments: No gross anomalies noted  1 Term 09/29/14 [redacted]w[redacted]d 80:34 / 03:58 7 lb 4.8 oz (3.311 kg) F CS-LTranv EPI N LIV     Complications: Failure to Progress in Second Stage, Gestational hypertension   Physical Assessment:   Vitals:   02/22/20 1516  Weight: 225 lb (102.1 kg)  Body mass index is 39.23 kg/m.       Physical Examination:  General appearance - well appearing, and in no distress  Mental status - alert, oriented to person, place, and time  Psych:  She has a normal mood and affect  Skin - warm and dry, normal color, no suspicious lesions noted  Chest - effort normal, all lung  fields clear to auscultation bilaterally  Heart - normal rate and regular rhythm  Abdomen - soft, nontender  Extremities:  No swelling or varicosities noted     via [place] 12+4 wks,measurements c/w dates,CRL 58.03 mm,NB present,NT 1.7 mm,fhr 166 bpm,normal right ovary,simple left corpus luteal cyst 3.2 x 3 x 2.7 cm  Results for orders placed or performed in visit on 02/22/20 (from the past 24 hour(s))  POC Urinalysis Dipstick OB   Collection Time: 02/22/20  3:26 PM  Result Value Ref Range   Color, UA     Clarity, UA     Glucose, UA Negative Negative   Bilirubin, UA     Ketones, UA neg    Spec Grav, UA     Blood, UA neg    pH, UA     POC,PROTEIN,UA Negative Negative, Trace, Small (1+), Moderate (2+), Large (3+), 4+   Urobilinogen, UA     Nitrite, UA neg    Leukocytes, UA Negative Negative   Appearance     Odor      Assessment & Plan:  1) low-Risk Pregnancy G3P2002 at [redacted]w[redacted]d with an Estimated Date of Delivery: 09/01/20   2) Initial OB visit  3) Hx CS X2; repeat  4)  Hx GHTN: asa 162  Meds:  Meds ordered this encounter  Medications  . aspirin EC 81 MG tablet  Sig: Take 2 tablets (162 mg total) by mouth daily.    Dispense:  60 tablet    Refill:  6    Order Specific Question:   Supervising Provider    Answer:   Elonda Husky, LUTHER H [[id number]]  . omeprazole (PRILOSEC) 20 MG capsule    Sig: Take 1 capsule (20 mg total) by mouth daily.    Dispense:  30 capsule    Refill:  6    Order Specific Question:   Supervising Provider    Answer:   Florian Buff [[id number]]    Initial labs obtained Continue prenatal vitamins Rx prilosec Reviewed n/v relief measures and warning s/s to report Reviewed recommended weight gain based on pre-gravid BMI Encouraged well-balanced diet Watched video for carrier screening/genetic testing:  Genetic Screening discussed First Screen and Integrated Screen: requested Cystic fibrosis screening requested SMA screening requested Fragile X screening  requested Ultrasound discussed; fetal survey: requested CCNC completed  Follow-up: Return in about 26 weeks (around 03/21/2020).   Orders Placed This Encounter  Procedures  . Urine Culture  . GC/Chlamydia Probe Amp  . Pain Management Screening Profile (10S)  . Integrated 1  . Hepatitis C antibody  . Obstetric Panel, Including HIV  . Hgb Fractionation Cascade  . MaterniT 21 plus Core, Blood  . SMN1 COPY NUMBER ANALYSIS (SMA Carrier Screen)  . Fragile X, PCR and Southern  . POC Urinalysis Dipstick OB    Christin Fudge DNP, CNM 02/22/2020 3:51 PM

## 2020-02-22 NOTE — Progress Notes (Signed)
[place] 12+4 wks,measurements c/w dates,CRL 58.03 mm,NB present,NT 1.7 mm,fhr 166 bpm,normal right ovary,simple left corpus luteal cyst 3.2 x 3 x 2.7 cm

## 2020-02-22 NOTE — Patient Instructions (Signed)
For Headaches:   Stay well hydrated, drink enough water so that your urine is clear, sometimes if you are dehydrated you can get headaches  Eat small frequent meals and snacks, sometimes if you are hungry you can get headaches  Sometimes you get headaches during pregnancy from the pregnancy hormones  You can try tylenol (1-2 regular strength 325mg  or 1-2 extra strength 500mg ) as directed on the box. The least amount of medication that works is best.   Cool compresses (cool wet washcloth or ice pack) to area of head that is hurting  You can also try drinking a caffeinated drink to see if this will help  If not helping, try below:  For Prevention of Headaches/Migraines:  CoQ10 100mg  three times daily  Vitamin B2 400mg  daily  Magnesium Oxide 400-600mg  daily  If You Get a Bad Headache/Migraine:  Benadryl 25mg    Magnesium Oxide  1 large Gatorade  2 extra strength Tylenol (1,000mg  total)  1 cup coffee or Coke  If this doesn't help please call us @ [place], I greatly value your feedback.  If you receive a survey following your visit with [place] today, we appreciate you taking the time to fill it out.  Thanks, Nigel Berthold, DNP, [place]!!! It is now [facility] ([place], [place] [address]) Entrance located off of [address] parking   Nausea & Vomiting  Have saltine crackers or pretzels by your bed and eat a few bites before you raise your head out of bed in the morning  Eat small frequent meals throughout the day instead of large meals  Drink plenty of fluids throughout the day to stay hydrated, just don't drink a lot of fluids with your meals.  This can make your stomach fill up faster making you feel sick  Do not brush your teeth right after you eat  Products with real ginger are good for nausea, like ginger ale and ginger hard candy Make sure it says made  with real ginger!  Sucking on sour candy like lemon heads is also good for nausea  If your prenatal vitamins make you nauseated, take them at night so you will sleep through the nausea  Sea Bands  If you feel like you need medicine for the nausea & vomiting please let us know  If you are unable to keep any fluids or food down please let us know   Constipation  Drink plenty of fluid, preferably water, throughout the day  Eat foods high in fiber such as fruits, vegetables, and grains  Exercise, such as walking, is a good way to keep your bowels regular  Drink warm fluids, especially warm prune juice, or decaf coffee  Eat a 1/2 cup of real oatmeal (not instant), 1/2 cup applesauce, and 1/2-1 cup warm prune juice every day  If needed, you may take Colace (docusate sodium) stool softener once or twice a day to help keep the stool soft.   If you still are having problems with constipation, you may take Miralax once daily as needed to help keep your bowels regular.   Home Blood Pressure Monitoring for Patients   Your provider has recommended that you check your blood pressure (BP) at least once a week at home. If you do not have a blood pressure cuff at home, one will be provided for you. Contact your provider if you have not  received your monitor within 1 week.   Helpful Tips for Accurate Home Blood Pressure Checks   Don't smoke, exercise, or drink caffeine 30 minutes before checking your BP  Use the restroom before checking your BP (a full bladder can raise your pressure)  Relax in a comfortable upright chair  Feet on the ground  Left arm resting comfortably on a flat surface at the level of your heart  Legs uncrossed  Back supported  Sit quietly and don't talk  Place the cuff on your bare arm  Adjust snuggly, so that only two fingertips can fit between your skin and the top of the cuff  Check 2 readings separated by at least one minute  Keep a log of your BP  readings  For a visual, please reference this diagram: http://ccnc.care/bpdiagram  Provider Name: [facility]     Phone: [phone]  Zone 1: ALL CLEAR  Continue to monitor your symptoms:   BP reading is less than 140 (top number) or less than 90 (bottom number)   No right upper stomach pain  No headaches or seeing spots  No feeling nauseated or throwing up  No swelling in face and hands  Zone 2: CAUTION Call your doctor's office for any of the following:   BP reading is greater than 140 (top number) or greater than 90 (bottom number)   Stomach pain under your ribs in the middle or right side  Headaches or seeing spots  Feeling nauseated or throwing up  Swelling in face and hands  Zone 3: EMERGENCY  Seek immediate medical care if you have any of the following:   BP reading is greater than160 (top number) or greater than 110 (bottom number)  Severe headaches not improving with Tylenol  Serious difficulty catching your breath  Any worsening symptoms from Zone 2    First Trimester of Pregnancy The first trimester of pregnancy is from week 1 until the end of week 12 (months 1 through 3). A week after a sperm fertilizes an egg, the egg will implant on the wall of the uterus. This embryo will begin to develop into a baby. Genes from you and your partner are forming the baby. The female genes determine whether the baby is a boy or a girl. At 6-8 weeks, the eyes and face are formed, and the heartbeat can be seen on ultrasound. At the end of 12 weeks, all the baby's organs are formed.  Now that you are pregnant, you will want to do everything you can to have a healthy baby. Two of the most important things are to get good prenatal care and to follow your health care provider's instructions. Prenatal care is all the medical care you receive before the baby's birth. This care will help prevent, find, and treat any problems during the pregnancy and childbirth. BODY  CHANGES Your body goes through many changes during pregnancy. The changes vary from woman to woman.   You may gain or lose a couple of pounds at first.  You may feel sick to your stomach (nauseous) and throw up (vomit). If the vomiting is uncontrollable, call your health care provider.  You may tire easily.  You may develop headaches that can be relieved by medicines approved by your health care provider.  You may urinate more often. Painful urination may mean you have a bladder infection.  You may develop heartburn as a result of your pregnancy.  You may develop constipation because certain hormones are causing the  muscles that push waste through your intestines to slow down.  You may develop hemorrhoids or swollen, bulging veins (varicose veins).  Your breasts may begin to grow larger and become tender. Your nipples may stick out more, and the tissue that surrounds them (areola) may become darker.  Your gums may bleed and may be sensitive to brushing and flossing.  Dark spots or blotches (chloasma, mask of pregnancy) may develop on your face. This will likely fade after the baby is born.  Your menstrual periods will stop.  You may have a loss of appetite.  You may develop cravings for certain kinds of food.  You may have changes in your emotions from day to day, such as being excited to be pregnant or being concerned that something may go wrong with the pregnancy and baby.  You may have more vivid and strange dreams.  You may have changes in your hair. These can include thickening of your hair, rapid growth, and changes in texture. Some women also have hair loss during or after pregnancy, or hair that feels dry or thin. Your hair will most likely return to normal after your baby is born. WHAT TO EXPECT AT YOUR PRENATAL VISITS During a routine prenatal visit:  You will be weighed to make sure you and the baby are growing normally.  Your blood pressure will be taken.  Your  abdomen will be measured to track your baby's growth.  The fetal heartbeat will be listened to starting around week 10 or 12 of your pregnancy.  Test results from any previous visits will be discussed. Your health care provider may ask you:  How you are feeling.  If you are feeling the baby move.  If you have had any abnormal symptoms, such as leaking fluid, bleeding, severe headaches, or abdominal cramping.  If you have any questions. Other tests that may be performed during your first trimester include:  Blood tests to find your blood type and to check for the presence of any previous infections. They will also be used to check for low iron levels (anemia) and Rh antibodies. Later in the pregnancy, blood tests for diabetes will be done along with other tests if problems develop.  Urine tests to check for infections, diabetes, or protein in the urine.  An ultrasound to confirm the proper growth and development of the baby.  An amniocentesis to check for possible genetic problems.  Fetal screens for spina bifida and Down syndrome.  You may need other tests to make sure you and the baby are doing well. HOME CARE INSTRUCTIONS  Medicines  Follow your health care provider's instructions regarding medicine use. Specific medicines may be either safe or unsafe to take during pregnancy.  Take your prenatal vitamins as directed.  If you develop constipation, try taking a stool softener if your health care provider approves. Diet  Eat regular, well-balanced meals. Choose a variety of foods, such as meat or vegetable-based protein, fish, milk and low-fat dairy products, vegetables, fruits, and whole grain breads and cereals. Your health care provider will help you determine the amount of weight gain that is right for you.  Avoid raw meat and uncooked cheese. These carry germs that can cause birth defects in the baby.  Eating four or five small meals rather than three large meals a day  may help relieve nausea and vomiting. If you start to feel nauseous, eating a few soda crackers can be helpful. Drinking liquids between meals instead of during meals also  seems to help nausea and vomiting.  If you develop constipation, eat more high-fiber foods, such as fresh vegetables or fruit and whole grains. Drink enough fluids to keep your urine clear or pale yellow. Activity and Exercise  Exercise only as directed by your health care provider. Exercising will help you:  Control your weight.  Stay in shape.  Be prepared for labor and delivery.  Experiencing pain or cramping in the lower abdomen or low back is a good sign that you should stop exercising. Check with your health care provider before continuing normal exercises.  Try to avoid standing for long periods of time. Move your legs often if you must stand in one place for a long time.  Avoid heavy lifting.  Wear low-heeled shoes, and practice good posture.  You may continue to have sex unless your health care provider directs you otherwise. Relief of Pain or Discomfort  Wear a good support bra for breast tenderness.    Take warm sitz baths to soothe any pain or discomfort caused by hemorrhoids. Use hemorrhoid cream if your health care provider approves.    Rest with your legs elevated if you have leg cramps or low back pain.  If you develop varicose veins in your legs, wear support hose. Elevate your feet for 15 minutes, 3-4 times a day. Limit salt in your diet. Prenatal Care  Schedule your prenatal visits by the twelfth week of pregnancy. They are usually scheduled monthly at first, then more often in the last 2 months before delivery.  Write down your questions. Take them to your prenatal visits.  Keep all your prenatal visits as directed by your health care provider. Safety  Wear your seat belt at all times when driving.  Make a list of emergency phone numbers, including numbers for family, friends, the  hospital, and police and fire departments. General Tips  Ask your health care provider for a referral to a local prenatal education class. Begin classes no later than at the beginning of month 6 of your pregnancy.  Ask for help if you have counseling or nutritional needs during pregnancy. Your health care provider can offer advice or refer you to specialists for help with various needs.  Do not use hot tubs, steam rooms, or saunas.  Do not douche or use tampons or scented sanitary pads.  Do not cross your legs for long periods of time.  Avoid cat litter boxes and soil used by cats. These carry germs that can cause birth defects in the baby and possibly loss of the fetus by miscarriage or stillbirth.  Avoid all smoking, herbs, alcohol, and medicines not prescribed by your health care provider. Chemicals in these affect the formation and growth of the baby.  Schedule a dentist appointment. At home, brush your teeth with a soft toothbrush and be gentle when you floss. SEEK MEDICAL CARE IF:   You have dizziness.  You have mild pelvic cramps, pelvic pressure, or nagging pain in the abdominal area.  You have persistent nausea, vomiting, or diarrhea.  You have a bad smelling vaginal discharge.  You have pain with urination.  You notice increased swelling in your face, hands, legs, or ankles. SEEK IMMEDIATE MEDICAL CARE IF:   You have a fever.  You are leaking fluid from your vagina.  You have spotting or bleeding from your vagina.  You have severe abdominal cramping or pain.  You have rapid weight gain or loss.  You vomit blood or material that looks like  coffee grounds.  You are exposed to [place] measles and have never had them.  You are exposed to fifth disease or chickenpox.  You develop a severe headache.  You have shortness of breath.  You have any kind of trauma, such as from a fall or a car accident. Document Released: 11/17/2001 Document Revised: 04/09/2014  Document Reviewed: 10/03/2013 [facility] Patient Information 2015 [place], [place]. This information is not intended to replace advice given to you by your health care provider. Make sure you discuss any questions you have with your health care provider.  Coronavirus (COVID-19) Are you at risk?  Are you at risk for the Coronavirus (COVID-19)?  To be considered HIGH RISK for Coronavirus (COVID-19), you have to meet the following criteria:   Traveled to [place], [place], [place], [place] or [place]; or in the [place] to [place], [place], [place], or [place]; and have fever, cough, and shortness of breath within the last 2 weeks of travel OR  Been in close contact with a person diagnosed with COVID-19 within the last 2 weeks and have fever, cough, and shortness of breath  IF YOU DO NOT MEET THESE CRITERIA, YOU ARE CONSIDERED LOW RISK FOR COVID-19.  What to do if you are HIGH RISK for COVID-19?   If you are having a medical emergency, call 911.  Seek medical care right away. Before you go to a doctors office, urgent care or emergency department, call ahead and tell them about your recent travel, contact with someone diagnosed with COVID-19, and your symptoms. You should receive instructions from your physicians office regarding next steps of care.   When you arrive at healthcare provider, tell the healthcare staff immediately you have returned from visiting [place], [place], [place], [place] or [place]; or traveled in the [place] to [place], [place], [place], or [place]; in the last two weeks or you have been in close contact with a person diagnosed with COVID-19 in the last 2 weeks.    Tell the health care staff about your symptoms: fever, cough and shortness of breath.  After you have been seen by a medical provider, you will be either: o Tested for (COVID-19) and discharged home on quarantine except to seek medical care if symptoms worsen, and asked to  - Stay home  and avoid contact with others until you get your results (4-5 days)  - Avoid travel on public transportation if possible (such as bus, train, or airplane) or o Sent to the Emergency Department by EMS for evaluation, COVID-19 testing, and possible admission depending on your condition and test results.  What to do if you are LOW RISK for COVID-19?  Reduce your risk of any infection by using the same precautions used for avoiding the common cold or flu:   Wash your hands often with soap and warm water for at least 20 seconds.  If soap and water are not readily available, use an alcohol-based hand sanitizer with at least 60% alcohol.   If coughing or sneezing, cover your mouth and nose by coughing or sneezing into the elbow areas of your shirt or coat, into a tissue or into your sleeve (not your hands).  Avoid shaking hands with others and consider head nods or verbal greetings only.  Avoid touching your eyes, nose, or mouth with unwashed hands.   Avoid close contact with people who are sick.  Avoid places or events with large numbers of people in one location, like concerts or sporting  events.  Carefully consider travel plans you have or are making.  If you are planning any travel outside or inside the [place], visit the [facility] webpage for the latest health notices.  If you have some symptoms but not all symptoms, continue to monitor at home and seek medical attention if your symptoms worsen.  If you are having a medical emergency, call 911.   ADDITIONAL HEALTHCARE OPTIONS FOR [place] / e-Visit: [email or web address]         [facility]: [facility]: [id number]                   [facility]: [phone]     Safe Medications in Pregnancy   Acne: Benzoyl Peroxide Salicylic Acid  Backache/Headache: Tylenol: 2 regular strength every 4 hours OR               2 Extra strength every 6 hours  Colds/Coughs/Allergies: Benadryl (alcohol free) 25 mg every 6 hours as needed Breath right strips Claritin Cepacol throat lozenges Chloraseptic throat spray Cold-Eeze- up to three times per day Cough drops, alcohol free Flonase (by prescription only) Guaifenesin Mucinex Robitussin DM (plain only, alcohol free) Saline nasal spray/drops Sudafed (pseudoephedrine) & Actifed ** use only after [redacted] weeks gestation and if you do not have high blood pressure Tylenol Vicks Vaporub Zinc lozenges Zyrtec   Constipation: Colace Ducolax suppositories Fleet enema Glycerin suppositories Metamucil Milk of magnesia Miralax Senokot Smooth move tea  Diarrhea: Kaopectate Imodium A-D  *NO pepto Bismol  Hemorrhoids: Anusol Anusol HC Preparation H Tucks  Indigestion: Tums Maalox Mylanta Zantac  Pepcid  Insomnia: Benadryl (alcohol free) 25mg  every 6 hours as needed Tylenol PM Unisom, no Gelcaps  Leg Cramps: Tums MagGel  Nausea/Vomiting:  Bonine Dramamine Emetrol Ginger extract Sea bands Meclizine  Nausea medication to take during pregnancy:  Unisom (doxylamine succinate 25 mg tablets) Take one tablet daily at bedtime. If symptoms are not adequately controlled, the dose can be increased to a maximum recommended dose of two tablets daily (1/2 tablet in the morning, 1/2 tablet mid-afternoon and one at bedtime). Vitamin B6 100mg  tablets. Take one tablet twice a day (up to 200 mg per day).  Skin Rashes: Aveeno products Benadryl cream or 25mg  every 6 hours as needed Calamine Lotion 1% cortisone cream  Yeast infection: Gyne-lotrimin 7 Monistat 7   **If taking multiple medications, please check labels to avoid duplicating the same active ingredients **take medication as directed on the label ** Do not exceed 4000 mg of tylenol in 24 hours **Do not take medications that contain aspirin or ibuprofen

## 2020-02-27 LAB — OBSTETRIC PANEL, INCLUDING HIV
Antibody Screen: NEGATIVE
Basophils Absolute: 0 10*3/uL (ref 0.0–0.2)
Basos: 0 %
EOS (ABSOLUTE): 0.1 10*3/uL (ref 0.0–0.4)
Eos: 1 %
HIV Screen 4th Generation wRfx: NONREACTIVE
Hematocrit: 37.3 % (ref 34.0–46.6)
Hemoglobin: 11.9 g/dL (ref 11.1–15.9)
Hepatitis B Surface Ag: NEGATIVE
Immature Grans (Abs): 0 10*3/uL (ref 0.0–0.1)
Immature Granulocytes: 0 %
Lymphocytes Absolute: 1.9 10*3/uL (ref 0.7–3.1)
Lymphs: 22 %
MCH: 24.3 pg — ABNORMAL LOW (ref 26.6–33.0)
MCHC: 31.9 g/dL (ref 31.5–35.7)
MCV: 76 fL — ABNORMAL LOW (ref 79–97)
Monocytes Absolute: 0.6 10*3/uL (ref 0.1–0.9)
Monocytes: 7 %
Neutrophils Absolute: 6 10*3/uL (ref 1.4–7.0)
Neutrophils: 70 %
Platelets: 309 10*3/uL (ref 150–450)
RBC: 4.9 x10E6/uL (ref 3.77–5.28)
RDW: 15.1 % (ref 11.7–15.4)
RPR Ser Ql: NONREACTIVE
Rh Factor: POSITIVE
Rubella Antibodies, IGG: 1.37 index (ref >0.99)
WBC: 8.6 10*3/uL (ref 3.4–10.8)

## 2020-02-27 LAB — HGB FRACTIONATION CASCADE
Hgb A2: 2.3 % (ref 1.8–3.2)
Hgb A: 97.7 % (ref 96.4–98.8)
Hgb F: 0 % (ref 0.0–2.0)
Hgb S: 0 %

## 2020-02-27 LAB — INTEGRATED 1
Crown Rump Length: 58 mm
Gest. Age on Collection Date: 12.1 weeks
Maternal Age at EDD: 26.4 yr
Nuchal Translucency (NT): 1.7 mm
Number of Fetuses: 1
PAPP-A Value: 651.9 ng/mL
Weight: 225 [lb_av]

## 2020-02-27 LAB — HEPATITIS C ANTIBODY: Hep C Virus Ab: <0.1 s/co ratio (ref 0.0–0.9)

## 2020-02-28 LAB — PMP SCREEN PROFILE (10S), URINE
Amphetamine Scrn, Ur: NEGATIVE ng/mL
BARBITURATE SCREEN URINE: NEGATIVE ng/mL
BENZODIAZEPINE SCREEN, URINE: NEGATIVE ng/mL
CANNABINOIDS UR QL SCN: NEGATIVE ng/mL
Cocaine (Metab) Scrn, Ur: NEGATIVE ng/mL
Creatinine(Crt), U: 52.1 mg/dL (ref 20.0–300.0)
Methadone Screen, Urine: NEGATIVE ng/mL
OXYCODONE+OXYMORPHONE UR QL SCN: NEGATIVE ng/mL
Opiate Scrn, Ur: NEGATIVE ng/mL
Ph of Urine: 6.7 (ref 4.5–8.9)
Phencyclidine Qn, Ur: NEGATIVE ng/mL
Propoxyphene Scrn, Ur: NEGATIVE ng/mL

## 2020-02-28 LAB — URINE CULTURE

## 2020-02-28 LAB — GC/CHLAMYDIA PROBE AMP
Chlamydia trachomatis, NAA: NEGATIVE
Neisseria Gonorrhoeae by PCR: NEGATIVE

## 2020-02-29 ENCOUNTER — Other Ambulatory Visit: Payer: Self-pay | Admitting: Advanced Practice Midwife

## 2020-02-29 MED ORDER — NITROFURANTOIN MONOHYD MACRO 100 MG PO CAPS: 100.0000 mg | ORAL_CAPSULE | Freq: Two times a day (BID) | ORAL | 0 refills | Status: DC

## 2020-02-29 NOTE — Progress Notes (Signed)
macrobid for ASB

## 2020-03-07 LAB — MATERNIT 21 PLUS CORE, BLOOD
Fetal Fraction: 10
Result (T21): NEGATIVE
Trisomy 13 (Patau syndrome): NEGATIVE
Trisomy 18 (Edwards syndrome): NEGATIVE
Trisomy 21 (Down syndrome): NEGATIVE

## 2020-03-07 LAB — SMN1 COPY NUMBER ANALYSIS (SMA CARRIER SCREENING)

## 2020-03-07 LAB — FRAGILE X, PCR AND SOUTHERN

## 2020-03-21 ENCOUNTER — Ambulatory Visit (INDEPENDENT_AMBULATORY_CARE_PROVIDER_SITE_OTHER): Payer: PRIVATE HEALTH INSURANCE | Admitting: Women's Health

## 2020-03-21 ENCOUNTER — Encounter: Payer: Self-pay | Admitting: Women's Health

## 2020-03-21 ENCOUNTER — Other Ambulatory Visit: Payer: Self-pay

## 2020-03-21 VITALS — BP 151/85 | HR 97 | Wt 223.2 lb

## 2020-03-21 DIAGNOSIS — Z1389 Encounter for screening for other disorder: Secondary | ICD-10-CM

## 2020-03-21 DIAGNOSIS — Z331 Pregnant state, incidental: Secondary | ICD-10-CM

## 2020-03-21 DIAGNOSIS — Z3A16 16 weeks gestation of pregnancy: Secondary | ICD-10-CM

## 2020-03-21 DIAGNOSIS — I10 Essential (primary) hypertension: Secondary | ICD-10-CM | POA: Insufficient documentation

## 2020-03-21 DIAGNOSIS — O0992 Supervision of high risk pregnancy, unspecified, second trimester: Secondary | ICD-10-CM

## 2020-03-21 DIAGNOSIS — Z363 Encounter for antenatal screening for malformations: Secondary | ICD-10-CM

## 2020-03-21 DIAGNOSIS — Z98891 History of uterine scar from previous surgery: Secondary | ICD-10-CM

## 2020-03-21 DIAGNOSIS — O10919 Unspecified pre-existing hypertension complicating pregnancy, unspecified trimester: Secondary | ICD-10-CM

## 2020-03-21 DIAGNOSIS — R8271 Bacteriuria: Secondary | ICD-10-CM

## 2020-03-21 DIAGNOSIS — O10912 Unspecified pre-existing hypertension complicating pregnancy, second trimester: Secondary | ICD-10-CM

## 2020-03-21 DIAGNOSIS — Z1379 Encounter for other screening for genetic and chromosomal anomalies: Secondary | ICD-10-CM

## 2020-03-21 DIAGNOSIS — O0990 Supervision of high risk pregnancy, unspecified, unspecified trimester: Secondary | ICD-10-CM

## 2020-03-21 LAB — POCT URINALYSIS DIPSTICK OB
Blood, UA: NEGATIVE
Glucose, UA: NEGATIVE
Ketones, UA: NEGATIVE
Leukocytes, UA: NEGATIVE
Nitrite, UA: NEGATIVE
POC,PROTEIN,UA: NEGATIVE

## 2020-03-21 MED ORDER — LABETALOL HCL 200 MG PO TABS: 200.0000 mg | ORAL_TABLET | Freq: Two times a day (BID) | ORAL | 3 refills | Status: DC

## 2020-03-21 NOTE — Patient Instructions (Addendum)
Melissa Livingston, I greatly value your feedback.  If you receive a survey following your visit with [place] today, we appreciate you taking the time to fill it out.  Thanks, Knute Neu, CNM, WHNP-BC  [facility] ([place], [place] [address]) Entrance C, located off of [address] parking  Go to [email or web address] to register for FREE online childbirth classes  For Headaches:   Stay well hydrated, drink enough water so that your urine is clear, sometimes if you are dehydrated you can get headaches  Eat small frequent meals and snacks, sometimes if you are hungry you can get headaches  Sometimes you get headaches during pregnancy from the pregnancy hormones  You can try tylenol (1-2 regular strength 325mg  or 1-2 extra strength 500mg ) as directed on the box. The least amount of medication that works is best.   Cool compresses (cool wet washcloth or ice pack) to area of head that is hurting  You can also try drinking a caffeinated drink to see if this will help  If not helping, try below:  For Prevention of Headaches/Migraines:  CoQ10 100mg  three times daily  Vitamin B2 400mg  daily  Magnesium Oxide 400-600mg  daily  If You Get a Bad Headache/Migraine:  Benadryl 25mg    Magnesium Oxide  1 large Gatorade  2 extra strength Tylenol (1,000mg  total)  1 cup coffee or Coke  If this doesn't help please call us @ [phone]    [facility] Pediatricians/Family Doctors:  [place] [phone]                 [place] [phone] (usually not accepting new patients unless you have family there already, you are always welcome to call and ask)       [facility] Department [phone]       [facility] Pediatricians/Family Doctors:   [facility]: [phone]  [facility]: [phone]  [facility]:  [place] Doctors:   [facility]: [place]: [place]:  [place]: [phone]    Home Blood Pressure Monitoring for Patients   Your provider has recommended that you check your blood pressure (BP) at least once a week at home. If you do not have a blood pressure cuff at home, one will be provided for you. Contact your provider if you have not received your monitor within 1 week.   Helpful Tips for Accurate Home Blood Pressure Checks  . Don't smoke, exercise, or drink caffeine 30 minutes before checking your BP . Use the restroom before checking your BP (a full bladder can raise your pressure) . Relax in a comfortable upright chair . Feet on the ground . Left arm resting comfortably on a flat surface at the level of your heart . Legs uncrossed . Back supported . Sit quietly and don't talk . Place the cuff on your bare arm . Adjust snuggly, so that only two fingertips can fit between your skin and the top of the cuff . Check 2 readings separated by at least one minute . Keep a log of your BP readings . For a visual, please reference this diagram: http://ccnc.care/bpdiagram  Provider Name: [facility]     Phone: [phone]  Zone 1: ALL CLEAR  Continue to monitor your symptoms:  . BP  reading is less than 140 (top number) or less than 90 (bottom number)  . No right upper stomach pain . No headaches or seeing spots . No feeling nauseated or throwing up . No swelling in face and hands  Zone 2: CAUTION Call your doctor's office for any of the following:  . BP reading is greater than 140 (top number) or greater than 90 (bottom number)  . Stomach pain under your ribs in the middle or right side . Headaches or seeing spots . Feeling nauseated or throwing up . Swelling in face and hands  Zone 3: EMERGENCY  Seek immediate medical care if you have  any of the following:  . BP reading is greater than160 (top number) or greater than 110 (bottom number) . Severe headaches not improving with Tylenol . Serious difficulty catching your breath . Any worsening symptoms from Zone 2     Second Trimester of Pregnancy The second trimester is from week 14 through week 27 (months 4 through 6). The second trimester is often a time when you feel your best. Your body has adjusted to being pregnant, and you begin to feel better physically. Usually, morning sickness has lessened or quit completely, you may have more energy, and you may have an increase in appetite. The second trimester is also a time when the fetus is growing rapidly. At the end of the sixth month, the fetus is about 9 inches long and weighs about 1 pounds. You will likely begin to feel the baby move (quickening) between 16 and 20 weeks of pregnancy. Body changes during your second trimester Your body continues to go through many changes during your second trimester. The changes vary from woman to woman.  Your weight will continue to increase. You will notice your lower abdomen bulging out.  You may begin to get stretch marks on your hips, abdomen, and breasts.  You may develop headaches that can be relieved by medicines. The medicines should be approved by your health care provider.  You may urinate more often because the fetus is pressing on your bladder.  You may develop or continue to have heartburn as a result of your pregnancy.  You may develop constipation because certain hormones are causing the muscles that push waste through your intestines to slow down.  You may develop hemorrhoids or swollen, bulging veins (varicose veins).  You may have back pain. This is caused by: ? Weight gain. ? Pregnancy hormones that are relaxing the joints in your pelvis. ? A shift in weight and the muscles that support your balance.  Your breasts will continue to grow and they will continue to  become tender.  Your gums may bleed and may be sensitive to brushing and flossing.  Dark spots or blotches (chloasma, mask of pregnancy) may develop on your face. This will likely fade after the baby is born.  A dark line from your belly button to the pubic area (linea nigra) may appear. This will likely fade after the baby is born.  You may have changes in your hair. These can include thickening of your hair, rapid growth, and changes in texture. Some women also have hair loss during or after pregnancy, or hair that feels dry or thin. Your hair will most likely return to normal after your baby is born.  What to expect at prenatal visits During a routine prenatal visit:  You will be weighed to make sure you and the fetus are growing normally.  Your blood pressure will  be taken.  Your abdomen will be measured to track your baby's growth.  The fetal heartbeat will be listened to.  Any test results from the previous visit will be discussed.  Your health care provider may ask you:  How you are feeling.  If you are feeling the baby move.  If you have had any abnormal symptoms, such as leaking fluid, bleeding, severe headaches, or abdominal cramping.  If you are using any tobacco products, including cigarettes, chewing tobacco, and electronic cigarettes.  If you have any questions.  Other tests that may be performed during your second trimester include:  Blood tests that check for: ? Low iron levels (anemia). ? High blood sugar that affects pregnant women (gestational diabetes) between 40 and 28 weeks. ? Rh antibodies. This is to check for a protein on red blood cells (Rh factor).  Urine tests to check for infections, diabetes, or protein in the urine.  An ultrasound to confirm the proper growth and development of the baby.  An amniocentesis to check for possible genetic problems.  Fetal screens for spina bifida and Down syndrome.  HIV (human immunodeficiency virus)  testing. Routine prenatal testing includes screening for HIV, unless you choose not to have this test.  Follow these instructions at home: Medicines  Follow your health care provider's instructions regarding medicine use. Specific medicines may be either safe or unsafe to take during pregnancy.  Take a prenatal vitamin that contains at least 600 micrograms (mcg) of folic acid.  If you develop constipation, try taking a stool softener if your health care provider approves. Eating and drinking  Eat a balanced diet that includes fresh fruits and vegetables, whole grains, good sources of protein such as meat, eggs, or tofu, and low-fat dairy. Your health care provider will help you determine the amount of weight gain that is right for you.  Avoid raw meat and uncooked cheese. These carry germs that can cause birth defects in the baby.  If you have low calcium intake from food, talk to your health care provider about whether you should take a daily calcium supplement.  Limit foods that are high in fat and processed sugars, such as fried and sweet foods.  To prevent constipation: ? Drink enough fluid to keep your urine clear or pale yellow. ? Eat foods that are high in fiber, such as fresh fruits and vegetables, whole grains, and beans. Activity  Exercise only as directed by your health care provider. Most women can continue their usual exercise routine during pregnancy. Try to exercise for 30 minutes at least 5 days a week. Stop exercising if you experience uterine contractions.  Avoid heavy lifting, wear low heel shoes, and practice good posture.  A sexual relationship may be continued unless your health care provider directs you otherwise. Relieving pain and discomfort  Wear a good support bra to prevent discomfort from breast tenderness.  Take warm sitz baths to soothe any pain or discomfort caused by hemorrhoids. Use hemorrhoid cream if your health care provider approves.  Rest  with your legs elevated if you have leg cramps or low back pain.  If you develop varicose veins, wear support hose. Elevate your feet for 15 minutes, 3-4 times a day. Limit salt in your diet. Prenatal Care  Write down your questions. Take them to your prenatal visits.  Keep all your prenatal visits as told by your health care provider. This is important. Safety  Wear your seat belt at all times when driving.  Make  a list of emergency phone numbers, including numbers for family, friends, the hospital, and police and fire departments. General instructions  Ask your health care provider for a referral to a local prenatal education class. Begin classes no later than the beginning of month 6 of your pregnancy.  Ask for help if you have counseling or nutritional needs during pregnancy. Your health care provider can offer advice or refer you to specialists for help with various needs.  Do not use hot tubs, steam rooms, or saunas.  Do not douche or use tampons or scented sanitary pads.  Do not cross your legs for long periods of time.  Avoid cat litter boxes and soil used by cats. These carry germs that can cause birth defects in the baby and possibly loss of the fetus by miscarriage or stillbirth.  Avoid all smoking, herbs, alcohol, and unprescribed drugs. Chemicals in these products can affect the formation and growth of the baby.  Do not use any products that contain nicotine or tobacco, such as cigarettes and e-cigarettes. If you need help quitting, ask your health care provider.  Visit your dentist if you have not gone yet during your pregnancy. Use a soft toothbrush to brush your teeth and be gentle when you floss. Contact a health care provider if:  You have dizziness.  You have mild pelvic cramps, pelvic pressure, or nagging pain in the abdominal area.  You have persistent nausea, vomiting, or diarrhea.  You have a bad smelling vaginal discharge.  You have pain when you  urinate. Get help right away if:  You have a fever.  You are leaking fluid from your vagina.  You have spotting or bleeding from your vagina.  You have severe abdominal cramping or pain.  You have rapid weight gain or weight loss.  You have shortness of breath with chest pain.  You notice sudden or extreme swelling of your face, hands, ankles, feet, or legs.  You have not felt your baby move in over an hour.  You have severe headaches that do not go away when you take medicine.  You have vision changes. Summary  The second trimester is from week 14 through week 27 (months 4 through 6). It is also a time when the fetus is growing rapidly.  Your body goes through many changes during pregnancy. The changes vary from woman to woman.  Avoid all smoking, herbs, alcohol, and unprescribed drugs. These chemicals affect the formation and growth your baby.  Do not use any tobacco products, such as cigarettes, chewing tobacco, and e-cigarettes. If you need help quitting, ask your health care provider.  Contact your health care provider if you have any questions. Keep all prenatal visits as told by your health care provider. This is important. This information is not intended to replace advice given to you by your health care provider. Make sure you discuss any questions you have with your health care provider. Document Released: 11/17/2001 Document Revised: 04/30/2016 Document Reviewed: 01/24/2013 Elsevier Interactive Patient Education  2017 [place] FLU! Because you are pregnant, we at [facility], along with the Centers for Disease Control (CDC), recommend that you receive the flu vaccine to protect yourself and your baby from the flu. The flu is more likely to cause severe illness in pregnant women than in women of reproductive age who are not pregnant. Changes in the immune system, heart, and lungs during pregnancy make pregnant women (and  women up  to two weeks postpartum) more prone to severe illness from flu, including illness resulting in hospitalization. Flu also may be harmful for a pregnant woman's developing baby. A common flu symptom is fever, which may be associated with neural tube defects and other adverse outcomes for a developing baby. Getting vaccinated can also help protect a baby after birth from flu. (Mom passes antibodies onto the developing baby during her pregnancy.)  A Flu Vaccine is the Best Protection Against Flu Getting a flu vaccine is the first and most important step in protecting against flu. Pregnant women should get a flu shot and not the live attenuated influenza vaccine (LAIV), also known as nasal spray flu vaccine. Flu vaccines given during pregnancy help protect both the mother and her baby from flu. Vaccination has been shown to reduce the risk of flu-associated acute respiratory infection in pregnant women by up to one-half. A 2018 study showed that getting a flu shot reduced a pregnant woman's risk of being hospitalized with flu by an average of 40 percent. Pregnant women who get a flu vaccine are also helping to protect their babies from flu illness for the first several months after their birth, when they are too young to get vaccinated.   A Long Record of Safety for Flu Shots in Pregnant Women Flu shots have been given to millions of pregnant women over many years with a good safety record. There is a lot of evidence that flu vaccines can be given safely during pregnancy; though these data are limited for the first trimester. The CDC recommends that pregnant women get vaccinated during any trimester of their pregnancy. It is very important for pregnant women to get the flu shot.   Other Preventive Actions In addition to getting a flu shot, pregnant women should take the same everyday preventive actions the CDC recommends of everyone, including covering coughs, washing hands often, and avoiding people  who are sick.  Symptoms and Treatment If you get sick with flu symptoms call your doctor right away. There are antiviral drugs that can treat flu illness and prevent serious flu complications. The CDC recommends prompt treatment for people who have influenza infection or suspected influenza infection and who are at high risk of serious flu complications, such as people with asthma, diabetes (including gestational diabetes), or heart disease. Early treatment of influenza in hospitalized pregnant women has been shown to reduce the length of the hospital stay.  Symptoms Flu symptoms include fever, cough, sore throat, runny or stuffy nose, body aches, headache, chills and fatigue. Some people may also have vomiting and diarrhea. People may be infected with the flu and have respiratory symptoms without a fever.  Early Treatment is Important for Pregnant Women Treatment should begin as soon as possible because antiviral drugs work best when started early (within 48 hours after symptoms start). Antiviral drugs can make your flu illness milder and make you feel better faster. They may also prevent serious health problems that can result from flu illness. Oral oseltamivir (Tamiflu) is the preferred treatment for pregnant women because it has the most studies available to suggest that it is safe and beneficial. Antiviral drugs require a prescription from your provider. Having a fever caused by flu infection or other infections early in pregnancy may be linked to birth defects in a baby. In addition to taking antiviral drugs, pregnant women who get a fever should treat their fever with Tylenol (acetaminophen) and contact their provider immediately.  When to [place]  If you are pregnant and have any of these signs, seek care immediately:  Difficulty breathing or shortness of breath  Pain or pressure in the chest or abdomen  Sudden dizziness  Confusion  Severe or persistent  vomiting  High fever that is not responding to Tylenol (or store brand equivalent)  Decreased or no movement of your baby  [email or web address]

## 2020-03-21 NOTE — Progress Notes (Signed)
HIGH-RISK PREGNANCY VISIT Patient name: Melissa Livingston MRN [id number]  Date of birth: Apr 09, 1996 Chief Complaint:   Routine Prenatal Visit (2nd IT)  History of Present Illness:   Melissa Livingston is a 24 y.o. G88P2002 female at [redacted]w[redacted]d with an Estimated Date of Delivery: 09/01/20 being seen today for ongoing management of a high-risk pregnancy complicated by chronic hypertension officially dx today, prev c/s x2.  Today she reports headaches, swelling legs, Lt back pain.  Depression screen [facility] 2/9 02/22/2020 06/05/2019 01/03/2018 11/03/2016  Decreased Interest 0 1 0 0  Down, Depressed, Hopeless 0 0 0 0  PHQ - 2 Score 0 1 0 0  Altered sleeping 1 - - -  Tired, decreased energy 2 - - -  Change in appetite 3 - - -  Feeling bad or failure about yourself  0 - - -  Trouble concentrating 0 - - -  Moving slowly or fidgety/restless 0 - - -  Suicidal thoughts 0 - - -  PHQ-9 Score 6 - - -  Some recent data might be hidden    Contractions: Not present.  .  Movement: Absent. denies leaking of fluid.  Review of Systems:   Pertinent items are noted in HPI Denies abnormal vaginal discharge w/ itching/odor/irritation, headaches, visual changes, shortness of breath, chest pain, abdominal pain, severe nausea/vomiting, or problems with urination or bowel movements unless otherwise stated above. Pertinent History Reviewed:  Reviewed past medical,surgical, social, obstetrical and family history.  Reviewed problem list, medications and allergies. Physical Assessment:   Vitals:   03/21/20 1456  BP: (!) 151/85  Pulse: 97  Weight: 223 lb 3.2 oz (101.2 kg)  Body mass index is 38.92 kg/m.           Physical Examination:   General appearance: alert, well appearing, and in no distress  Mental status: alert, oriented to person, place, and time  Skin: warm & dry   Extremities: Edema: Trace    Cardiovascular: normal heart rate noted  Respiratory: normal respiratory effort, no distress  Abdomen: gravid, soft,  non-tender  Pelvic: Cervical exam deferred         Fetal Status: Fetal Heart Rate (bpm): 145    Movement: Absent    Fetal Surveillance Testing today: doppler   Chaperone: n/a    Results for orders placed or performed in visit on 03/21/20 (from the past 24 hour(s))  POC Urinalysis Dipstick OB   Collection Time: 03/21/20  3:04 PM  Result Value Ref Range   Color, UA     Clarity, UA     Glucose, UA Negative Negative   Bilirubin, UA     Ketones, UA neg    Spec Grav, UA     Blood, UA neg    pH, UA     POC,PROTEIN,UA Negative Negative, Trace, Small (1+), Moderate (2+), Large (3+), 4+   Urobilinogen, UA     Nitrite, UA neg    Leukocytes, UA Negative Negative   Appearance     Odor      Assessment & Plan:  1) High-risk pregnancy G3P2002 at [redacted]w[redacted]d with an Estimated Date of Delivery: 09/01/20   2) CHTN, dx today, rx Labetalol 200mg  BID, take bp at least BID, get CMP, 24hr urine today. Already taking asa 162mg  d/t h/o GHTN  3) Prev c/s x 2, for RCS  4) ASB 1st trimester> urine cx poc today  5) Legs swelling> elevate, compression hose  Meds:  Meds ordered this encounter  Medications  .  labetalol (NORMODYNE) 200 MG tablet    Sig: Take 1 tablet (200 mg total) by mouth 2 (two) times daily.    Dispense:  60 tablet    Refill:  3    Order Specific Question:   Supervising Provider    Answer:   Tania Ade H [[id number]]    Labs/procedures today: 2nd IT, CMP, 24hr urine, urine cx  Treatment Plan:  Growth u/s @ 20, 24, 28, 32, 36wks    2x/wk testing nst/sono @ 32wks or weekly BPP    Deliver 38-39wks (37wks or prn if poor control)____   Reviewed: Preterm labor symptoms and general obstetric precautions including but not limited to vaginal bleeding, contractions, leaking of fluid and fetal movement were reviewed in detail with the patient.  All questions were answered.   Follow-up: Return in about 2 weeks (around 04/04/2020) for [place], [id number], in person, MD or CNM.  Orders Placed This  Encounter  Procedures  . Urine Culture  . US OB Comp + 14 Wk  . INTEGRATED 2  . Comprehensive metabolic panel  . Protein, urine, 24 hour  . POC Urinalysis Dipstick OB   Roma Schanz CNM, [facility] 03/21/2020 3:51 PM

## 2020-03-22 LAB — COMPREHENSIVE METABOLIC PANEL
ALT: 7 IU/L (ref 0–32)
AST: 11 IU/L (ref 0–40)
Albumin/Globulin Ratio: 1.6 (ref 1.2–2.2)
Albumin: 4.1 g/dL (ref 3.9–5.0)
Alkaline Phosphatase: 79 IU/L (ref 39–117)
BUN/Creatinine Ratio: 5 — ABNORMAL LOW (ref 9–23)
BUN: 3 mg/dL — ABNORMAL LOW (ref 6–20)
Bilirubin Total: 0.4 mg/dL (ref 0.0–1.2)
CO2: 23 mmol/L (ref 20–29)
Calcium: 8.9 mg/dL (ref 8.7–10.2)
Chloride: 101 mmol/L (ref 96–106)
Creatinine, Ser: 0.64 mg/dL (ref 0.57–1.00)
GFR calc Af Amer: 143 mL/min/{1.73_m2} (ref >59)
GFR calc non Af Amer: 124 mL/min/{1.73_m2} (ref >59)
Globulin, Total: 2.6 g/dL (ref 1.5–4.5)
Glucose: 76 mg/dL (ref 65–99)
Potassium: 3.7 mmol/L (ref 3.5–5.2)
Sodium: 137 mmol/L (ref 134–144)
Total Protein: 6.7 g/dL (ref 6.0–8.5)

## 2020-03-23 LAB — URINE CULTURE

## 2020-03-23 LAB — INTEGRATED 2
AFP MoM: 1.18
Alpha-Fetoprotein: 31 ng/mL
Crown Rump Length: 58 mm
DIA MoM: 0.54
DIA Value: 66.7 pg/mL
Estriol, Unconjugated: 1.22 ng/mL
Gest. Age on Collection Date: 12.1 weeks
Gestational Age: 16.1 weeks
Maternal Age at EDD: 26.4 yr
Nuchal Translucency (NT): 1.7 mm
Nuchal Translucency MoM: 1.31
Number of Fetuses: 1
PAPP-A MoM: 1.32
PAPP-A Value: 651.9 ng/mL
Test Results:: NEGATIVE
Weight: 225 [lb_av]
Weight: 225 [lb_av]
hCG MoM: 1.02
hCG Value: 26.2 IU/mL
uE3 MoM: 1.44

## 2020-04-01 ENCOUNTER — Other Ambulatory Visit: Payer: Self-pay | Admitting: Women's Health

## 2020-04-02 LAB — PROTEIN, URINE, 24 HOUR
Protein, 24H Urine: 110 mg/24 hr (ref 30–150)
Protein, Ur: 11 mg/dL

## 2020-04-05 ENCOUNTER — Ambulatory Visit (INDEPENDENT_AMBULATORY_CARE_PROVIDER_SITE_OTHER): Payer: PRIVATE HEALTH INSURANCE

## 2020-04-05 ENCOUNTER — Other Ambulatory Visit: Payer: Self-pay

## 2020-04-05 ENCOUNTER — Ambulatory Visit (INDEPENDENT_AMBULATORY_CARE_PROVIDER_SITE_OTHER): Payer: PRIVATE HEALTH INSURANCE | Admitting: Obstetrics & Gynecology

## 2020-04-05 ENCOUNTER — Encounter: Payer: Self-pay | Admitting: Obstetrics & Gynecology

## 2020-04-05 VITALS — BP 121/78 | HR 92 | Wt 221.0 lb

## 2020-04-05 DIAGNOSIS — O0992 Supervision of high risk pregnancy, unspecified, second trimester: Secondary | ICD-10-CM

## 2020-04-05 DIAGNOSIS — O10912 Unspecified pre-existing hypertension complicating pregnancy, second trimester: Secondary | ICD-10-CM

## 2020-04-05 DIAGNOSIS — Z363 Encounter for antenatal screening for malformations: Secondary | ICD-10-CM | POA: Diagnosis not present

## 2020-04-05 DIAGNOSIS — Z3A18 18 weeks gestation of pregnancy: Secondary | ICD-10-CM

## 2020-04-05 DIAGNOSIS — O321XX0 Maternal care for breech presentation, not applicable or unspecified: Secondary | ICD-10-CM | POA: Diagnosis not present

## 2020-04-05 DIAGNOSIS — Z331 Pregnant state, incidental: Secondary | ICD-10-CM

## 2020-04-05 DIAGNOSIS — Z1389 Encounter for screening for other disorder: Secondary | ICD-10-CM

## 2020-04-05 DIAGNOSIS — O0990 Supervision of high risk pregnancy, unspecified, unspecified trimester: Secondary | ICD-10-CM

## 2020-04-05 DIAGNOSIS — O10919 Unspecified pre-existing hypertension complicating pregnancy, unspecified trimester: Secondary | ICD-10-CM

## 2020-04-05 DIAGNOSIS — O34219 Maternal care for unspecified type scar from previous cesarean delivery: Secondary | ICD-10-CM

## 2020-04-05 DIAGNOSIS — O132 Gestational [pregnancy-induced] hypertension without significant proteinuria, second trimester: Secondary | ICD-10-CM

## 2020-04-05 LAB — POCT URINALYSIS DIPSTICK OB
Blood, UA: NEGATIVE
Glucose, UA: NEGATIVE
Ketones, UA: NEGATIVE
Nitrite, UA: NEGATIVE
POC,PROTEIN,UA: NEGATIVE

## 2020-04-05 NOTE — Progress Notes (Signed)
HIGH-RISK PREGNANCY VISIT Patient name: Melissa Livingston MRN [id number]  Date of birth: Apr 26, 1996 Chief Complaint:   High Risk Gestation ([place] today)  History of Present Illness:   Melissa Livingston is a 24 y.o. G81P2002 female at [redacted]w[redacted]d with an Estimated Date of Delivery: 09/01/20 being seen today for ongoing management of a high-risk pregnancy complicated by chronic hypertension currently on labetlol 200 BID.  Today she reports no complaints.  Depression screen [facility] 2/9 02/22/2020 06/05/2019 01/03/2018 11/03/2016  Decreased Interest 0 1 0 0  Down, Depressed, Hopeless 0 0 0 0  PHQ - 2 Score 0 1 0 0  Altered sleeping 1 - - -  Tired, decreased energy 2 - - -  Change in appetite 3 - - -  Feeling bad or failure about yourself  0 - - -  Trouble concentrating 0 - - -  Moving slowly or fidgety/restless 0 - - -  Suicidal thoughts 0 - - -  PHQ-9 Score 6 - - -  Some recent data might be hidden    Contractions: Not present. Vag. Bleeding: None.  Movement: Present. denies leaking of fluid.  Review of Systems:   Pertinent items are noted in HPI Denies abnormal vaginal discharge w/ itching/odor/irritation, headaches, visual changes, shortness of breath, chest pain, abdominal pain, severe nausea/vomiting, or problems with urination or bowel movements unless otherwise stated above. Pertinent History Reviewed:  Reviewed past medical,surgical, social, obstetrical and family history.  Reviewed problem list, medications and allergies. Physical Assessment:   Vitals:   04/05/20 1253  BP: 121/78  Pulse: 92  Weight: 221 lb (100.2 kg)  Body mass index is 38.53 kg/m.           Physical Examination:   General appearance: alert, well appearing, and in no distress  Mental status: alert, oriented to person, place, and time  Skin: warm & dry   Extremities: Edema: Trace    Cardiovascular: normal heart rate noted  Respiratory: normal respiratory effort, no distress  Abdomen: gravid, soft, non-tender  Pelvic: Cervical  exam deferred         Fetal Status:     Movement: Present    Fetal Surveillance Testing today: sonogram   Chaperone: n/a    Results for orders placed or performed in visit on 04/05/20 (from the past 24 hour(s))  POC Urinalysis Dipstick OB   Collection Time: 04/05/20 12:54 PM  Result Value Ref Range   Color, UA     Clarity, UA     Glucose, UA Negative Negative   Bilirubin, UA     Ketones, UA neg    Spec Grav, UA     Blood, UA neg    pH, UA     POC,PROTEIN,UA Negative Negative, Trace, Small (1+), Moderate (2+), Large (3+), 4+   Urobilinogen, UA     Nitrite, UA neg    Leukocytes, UA Trace (A) Negative   Appearance     Odor      Assessment & Plan:  1) High-risk pregnancy G3P2002 at [redacted]w[redacted]d with an Estimated Date of Delivery: 09/01/20   2) CHTN, stable, 200 BID + 162 mg ASA  3) Previous C section, for repeat  Meds: No orders of the defined types were placed in this encounter.   Labs/procedures today: sonogram is normal  Treatment Plan:  Per protocol  Reviewed:  labor symptoms and general obstetric precautions including but not limited to vaginal bleeding, contractions, leaking of fluid and fetal movement were reviewed in detail with the  patient.  All questions were answered.  home bp cuff. Rx faxed to . Check bp weekly, let us know if >140/90.   Follow-up: No follow-ups on file.  Orders Placed This Encounter  Procedures  . POC Urinalysis Dipstick OB   Mertie Clause Victoria Euceda 04/05/2020 1:01 PM

## 2020-04-05 NOTE — Progress Notes (Signed)
[place] 18+5 wks,breech,cx 4.5 cm,posterior placenta gr 0,fhr 135 bpm,svp of fluid 4.4 cm,normal right ovary,simple left ovarian cyst 3.7 x 2.9 x 2.7 cm,EFW 250 g 40%,anatomy complete,no obvious abnormalities

## 2020-04-09 ENCOUNTER — Other Ambulatory Visit: Payer: Self-pay | Admitting: Women's Health

## 2020-04-09 MED ORDER — CYCLOBENZAPRINE HCL 10 MG PO TABS: 10.0000 mg | ORAL_TABLET | Freq: Three times a day (TID) | ORAL | 0 refills | Status: DC | PRN

## 2020-05-02 ENCOUNTER — Encounter: Payer: Self-pay | Admitting: Women's Health

## 2020-05-02 ENCOUNTER — Ambulatory Visit (INDEPENDENT_AMBULATORY_CARE_PROVIDER_SITE_OTHER): Payer: PRIVATE HEALTH INSURANCE | Admitting: Women's Health

## 2020-05-02 VITALS — BP 131/76 | HR 114 | Wt 225.4 lb

## 2020-05-02 DIAGNOSIS — Z331 Pregnant state, incidental: Secondary | ICD-10-CM

## 2020-05-02 DIAGNOSIS — O0992 Supervision of high risk pregnancy, unspecified, second trimester: Secondary | ICD-10-CM

## 2020-05-02 DIAGNOSIS — O10012 Pre-existing essential hypertension complicating pregnancy, second trimester: Secondary | ICD-10-CM

## 2020-05-02 DIAGNOSIS — O10919 Unspecified pre-existing hypertension complicating pregnancy, unspecified trimester: Secondary | ICD-10-CM

## 2020-05-02 DIAGNOSIS — Z1389 Encounter for screening for other disorder: Secondary | ICD-10-CM

## 2020-05-02 DIAGNOSIS — R82998 Other abnormal findings in urine: Secondary | ICD-10-CM

## 2020-05-02 DIAGNOSIS — Z3A22 22 weeks gestation of pregnancy: Secondary | ICD-10-CM

## 2020-05-02 DIAGNOSIS — O0990 Supervision of high risk pregnancy, unspecified, unspecified trimester: Secondary | ICD-10-CM

## 2020-05-02 LAB — POCT URINALYSIS DIPSTICK OB
Blood, UA: NEGATIVE
Glucose, UA: NEGATIVE
Nitrite, UA: NEGATIVE

## 2020-05-02 NOTE — Progress Notes (Signed)
HIGH-RISK PREGNANCY VISIT Patient name: Melissa Livingston MRN [id number]  Date of birth: 1996/03/16 Chief Complaint:   High Risk Gestation  History of Present Illness:   Melissa Livingston is a 24 y.o. G37P2002 female at [redacted]w[redacted]d with an Estimated Date of Delivery: 09/01/20 being seen today for ongoing management of a high-risk pregnancy complicated by chronic hypertension currently on Labetalol 200mg  BID.  Today she reports headache, apap doesn't really help much. Denies visual changes, ruq/epigastric pain. Also Lt lower back pain radiating down Lt leg. Swelling in feet/legs. BPs at home stable, highest SBP 140, highest DBP 90.  Depression screen [facility] 2/9 02/22/2020 06/05/2019 01/03/2018 11/03/2016  Decreased Interest 0 1 0 0  Down, Depressed, Hopeless 0 0 0 0  PHQ - 2 Score 0 1 0 0  Altered sleeping 1 - - -  Tired, decreased energy 2 - - -  Change in appetite 3 - - -  Feeling bad or failure about yourself  0 - - -  Trouble concentrating 0 - - -  Moving slowly or fidgety/restless 0 - - -  Suicidal thoughts 0 - - -  PHQ-9 Score 6 - - -  Some recent data might be hidden    Contractions: Not present.  .  Movement: Present. denies leaking of fluid.  Review of Systems:   Pertinent items are noted in HPI Denies abnormal vaginal discharge w/ itching/odor/irritation, headaches, visual changes, shortness of breath, chest pain, abdominal pain, severe nausea/vomiting, or problems with urination or bowel movements unless otherwise stated above. Pertinent History Reviewed:  Reviewed past medical,surgical, social, obstetrical and family history.  Reviewed problem list, medications and allergies. Physical Assessment:   Vitals:   05/02/20 1359  BP: 131/76  Pulse: (!) 114  Weight: 225 lb 6.4 oz (102.2 kg)  Body mass index is 39.3 kg/m.           Physical Examination:   General appearance: alert, well appearing, and in no distress  Mental status: alert, oriented to person, place, and time  Skin: warm & dry    Extremities: Edema: Trace    Cardiovascular: normal heart rate noted  Respiratory: normal respiratory effort, no distress  Abdomen: gravid, soft, non-tender  Pelvic: Cervical exam deferred         Fetal Status: Fetal Heart Rate (bpm): 143 Fundal Height: 24 cm Movement: Present    Fetal Surveillance Testing today: doppler   Chaperone: n/a    Results for orders placed or performed in visit on 05/02/20 (from the past 24 hour(s))  POC Urinalysis Dipstick OB   Collection Time: 05/02/20  2:10 PM  Result Value Ref Range   Color, UA     Clarity, UA     Glucose, UA Negative Negative   Bilirubin, UA     Ketones, UA small    Spec Grav, UA     Blood, UA neg    pH, UA     POC,PROTEIN,UA Trace Negative, Trace, Small (1+), Moderate (2+), Large (3+), 4+   Urobilinogen, UA     Nitrite, UA neg    Leukocytes, UA Moderate (2+) (A) Negative   Appearance     Odor      Assessment & Plan:  1) High-risk pregnancy G3P2002 at [redacted]w[redacted]d with an Estimated Date of Delivery: 09/01/20   2) CHTN, stable on Labetalol 200mg  BID and ASA 162mg   3) Prev c/s x 2, for RCS  4) Headache> gave printed prevention/relief measures, if not improving/worsening let us know  5)  Lt sciatica> gave printed exercises  6) Leuks/protein in urine> asymptomatic, send cx  Meds: No orders of the defined types were placed in this encounter.   Labs/procedures today:   Treatment Plan:  Growth u/s q4wks    2x/wk testing nst/sono @ 32wks or weekly BPP    Deliver 38-39wks (37wks or prn if poor control)____   Reviewed: Preterm labor symptoms and general obstetric precautions including but not limited to vaginal bleeding, contractions, leaking of fluid and fetal movement were reviewed in detail with the patient.  All questions were answered. Has home bp cuff.   Follow-up: Return in about 25 days (around 05/27/2020) for HROB, US:EFW, PN2, in person, MD or CNM.  Orders Placed This Encounter  Procedures  . Urine Culture  . US OB  Follow Up  . POC Urinalysis Dipstick OB   Roma Schanz CNM, [facility] 05/02/2020 2:35 PM

## 2020-05-02 NOTE — Patient Instructions (Addendum)
[name], I greatly value your feedback.  If you receive a survey following your visit with [place] today, we appreciate you taking the time to fill it out.  Thanks, Knute Neu, CNM, WHNP-BC   You will have your sugar test next visit.  Please do not eat or drink anything after midnight the night before you come, not even water.  You will be here for at least two hours.  Please make an appointment online for the bloodwork at [email or web address] for 8:30am (or as close to this as possible). Make sure you select the [facility] service center. The day of the appointment, check in with our office first, then you will go to [place] to start the sugar test.    [place] [facility] [place] [address] Entrance C, located off of [address] parking  Go to [email or web address] to register for FREE online childbirth classes   Call the office [phone]) or go to [facility] if:  You begin to have strong, frequent contractions  Your water breaks.  Sometimes it is a big gush of fluid, sometimes it is just a trickle that keeps getting your panties wet or running down your legs  You have vaginal bleeding.  It is normal to have a small amount of spotting if your cervix was checked.   You don't feel your baby moving like normal.  If you don't, get you something to eat and drink and lay down and focus on feeling your baby move.   If your baby is still not moving like normal, you should call the office or go to [facility].  For Headaches:   Stay well hydrated, drink enough water so that your urine is clear, sometimes if you are dehydrated you can get headaches  Eat small frequent meals and snacks, sometimes if you are hungry you can get headaches  Sometimes you get headaches during pregnancy from the pregnancy hormones  You can try tylenol (1-2 regular strength 325mg  or 1-2 extra strength 500mg ) as directed on the box. The least amount of medication  that works is best.   Cool compresses (cool wet washcloth or ice pack) to area of head that is hurting  You can also try drinking a caffeinated drink to see if this will help  If not helping, try below:  For Prevention of Headaches/Migraines:  CoQ10 100mg  three times daily  Vitamin B2 400mg  daily  Magnesium Oxide 400-600mg  daily  If You Get a Bad Headache/Migraine:  Benadryl 25mg    Magnesium Oxide  1 large Gatorade  2 extra strength Tylenol (1,000mg  total)  1 cup coffee or Coke  If this doesn't help please call us @ [phone]    [facility] Pediatricians/Family Doctors:  [place] [phone]                 [place] [phone] (usually not accepting new patients unless you have family there already, you are always welcome to call and ask)       [place]       [facility] Pediatricians/Family Doctors:   [facility]: [phone]  [facility]: [phone]  [facility]: [place] Doctors:   [facility]: [place]: [place]:  [place]: [place]  Blood Pressure Monitoring for Patients   Your provider has recommended that you check your blood pressure (BP) at least once a week at home. If you do not have a blood pressure cuff at home, one will be provided for you. Contact your provider if you have not received your monitor within 1 week.   Helpful Tips for Accurate Home Blood Pressure Checks  . Don't smoke, exercise, or drink caffeine 30 minutes before checking your BP . Use the restroom before checking your BP (a full bladder can raise your pressure) . Relax in a comfortable upright chair . Feet on the ground . Left arm resting comfortably on a flat surface at the  level of your heart . Legs uncrossed . Back supported . Sit quietly and don't talk . Place the cuff on your bare arm . Adjust snuggly, so that only two fingertips can fit between your skin and the top of the cuff . Check 2 readings separated by at least one minute . Keep a log of your BP readings . For a visual, please reference this diagram: http://ccnc.care/bpdiagram  Provider Name: [facility]     Phone: [phone]  Zone 1: ALL CLEAR  Continue to monitor your symptoms:  . BP reading is less than 140 (top number) or less than 90 (bottom number)  . No right upper stomach pain . No headaches or seeing spots . No feeling nauseated or throwing up . No swelling in face and hands  Zone 2: CAUTION Call your doctor's office for any of the following:  . BP reading is greater than 140 (top number) or greater than 90 (bottom number)  . Stomach pain under your ribs in the middle or right side . Headaches or seeing spots . Feeling nauseated or throwing up . Swelling in face and hands  Zone 3: EMERGENCY  Seek immediate medical care if you have any of the following:  . BP reading is greater than160 (top number) or greater than 110 (bottom number) . Severe headaches not improving with Tylenol . Serious difficulty catching your breath . Any worsening symptoms from Zone 2   Second Trimester of Pregnancy The second trimester is from week 13 through week 28, months 4 through 6. The second trimester is often a time when you feel your best. Your body has also adjusted to being pregnant, and you begin to feel better physically. Usually, morning sickness has lessened or quit completely, you may have more energy, and you may have an increase in appetite. The second trimester is also a time when the fetus is growing rapidly. At the end of the sixth month, the fetus is about 9 inches long and weighs about 1 pounds. You will likely begin to feel the baby move (quickening) between 18 and 20 weeks  of the pregnancy. BODY CHANGES Your body goes through many changes during pregnancy. The changes vary from woman to woman.   Your weight will continue to increase. You will notice your lower abdomen bulging out.  You may begin to get stretch marks on your hips, abdomen, and breasts.  You may develop headaches that can be relieved by medicines approved by your health care provider.  You may urinate more often because the fetus is pressing on your bladder.  You may develop or continue to have heartburn as a result of your pregnancy.  You may develop constipation because certain hormones are causing the muscles that push waste through your intestines to slow down.  You may  develop hemorrhoids or swollen, bulging veins (varicose veins).  You may have back pain because of the weight gain and pregnancy hormones relaxing your joints between the bones in your pelvis and as a result of a shift in weight and the muscles that support your balance.  Your breasts will continue to grow and be tender.  Your gums may bleed and may be sensitive to brushing and flossing.  Dark spots or blotches (chloasma, mask of pregnancy) may develop on your face. This will likely fade after the baby is born.  A dark line from your belly button to the pubic area (linea nigra) may appear. This will likely fade after the baby is born.  You may have changes in your hair. These can include thickening of your hair, rapid growth, and changes in texture. Some women also have hair loss during or after pregnancy, or hair that feels dry or thin. Your hair will most likely return to normal after your baby is born. WHAT TO EXPECT AT YOUR PRENATAL VISITS During a routine prenatal visit:  You will be weighed to make sure you and the fetus are growing normally.  Your blood pressure will be taken.  Your abdomen will be measured to track your baby's growth.  The fetal heartbeat will be listened to.  Any test results from the  previous visit will be discussed. Your health care provider may ask you:  How you are feeling.  If you are feeling the baby move.  If you have had any abnormal symptoms, such as leaking fluid, bleeding, severe headaches, or abdominal cramping.  If you have any questions. Other tests that may be performed during your second trimester include:  Blood tests that check for:  Low iron levels (anemia).  Gestational diabetes (between 24 and 28 weeks).  Rh antibodies.  Urine tests to check for infections, diabetes, or protein in the urine.  An ultrasound to confirm the proper growth and development of the baby.  An amniocentesis to check for possible genetic problems.  Fetal screens for spina bifida and Down syndrome. HOME CARE INSTRUCTIONS   Avoid all smoking, herbs, alcohol, and unprescribed drugs. These chemicals affect the formation and growth of the baby.  Follow your health care provider's instructions regarding medicine use. There are medicines that are either safe or unsafe to take during pregnancy.  Exercise only as directed by your health care provider. Experiencing uterine cramps is a good sign to stop exercising.  Continue to eat regular, healthy meals.  Wear a good support bra for breast tenderness.  Do not use hot tubs, steam rooms, or saunas.  Wear your seat belt at all times when driving.  Avoid raw meat, uncooked cheese, cat litter boxes, and soil used by cats. These carry germs that can cause birth defects in the baby.  Take your prenatal vitamins.  Try taking a stool softener (if your health care provider approves) if you develop constipation. Eat more high-fiber foods, such as fresh vegetables or fruit and whole grains. Drink plenty of fluids to keep your urine clear or pale yellow.  Take warm sitz baths to soothe any pain or discomfort caused by hemorrhoids. Use hemorrhoid cream if your health care provider approves.  If you develop varicose veins, wear  support hose. Elevate your feet for 15 minutes, 3-4 times a day. Limit salt in your diet.  Avoid heavy lifting, wear low heel shoes, and practice good posture.  Rest with your legs elevated if you have leg cramps or  low back pain.  Visit your dentist if you have not gone yet during your pregnancy. Use a soft toothbrush to brush your teeth and be gentle when you floss.  A sexual relationship may be continued unless your health care provider directs you otherwise.  Continue to go to all your prenatal visits as directed by your health care provider. SEEK MEDICAL CARE IF:   You have dizziness.  You have mild pelvic cramps, pelvic pressure, or nagging pain in the abdominal area.  You have persistent nausea, vomiting, or diarrhea.  You have a bad smelling vaginal discharge.  You have pain with urination. SEEK IMMEDIATE MEDICAL CARE IF:   You have a fever.  You are leaking fluid from your vagina.  You have spotting or bleeding from your vagina.  You have severe abdominal cramping or pain.  You have rapid weight gain or loss.  You have shortness of breath with chest pain.  You notice sudden or extreme swelling of your face, hands, ankles, feet, or legs.  You have not felt your baby move in over an hour.  You have severe headaches that do not go away with medicine.  You have vision changes. Document Released: 11/17/2001 Document Revised: 11/28/2013 Document Reviewed: 01/24/2013 [facility] Patient Information 2015 [place], [place]. This information is not intended to replace advice given to you by your health care provider. Make sure you discuss any questions you have with your health care provider.   Sciatica Rehab Ask your health care provider which exercises are safe for you. Do exercises exactly as told by your health care provider and adjust them as directed. It is normal to feel mild stretching, pulling, tightness, or discomfort as you do these exercises. Stop right away if  you feel sudden pain or your pain gets worse. Do not begin these exercises until told by your health care provider. Stretching and range-of-motion exercises These exercises warm up your muscles and joints and improve the movement and flexibility of your hips and back. These exercises also help to relieve pain, numbness, and tingling. Sciatic nerve glide 1. Sit in a chair with your head facing down toward your chest. Place your hands behind your back. Let your shoulders slump forward. 2. Slowly straighten one of your legs while you tilt your head back as if you are looking toward the ceiling. Only straighten your leg as far as you can without making your symptoms worse. 3. Hold this position for __________ seconds. 4. Slowly return to the starting position. 5. Repeat with your other leg. Repeat __________ times. Complete this exercise __________ times a day. Knee to chest with hip adduction and internal rotation  1. Lie on your back on a firm surface with both legs straight. 2. Bend one of your knees and move it up toward your chest until you feel a gentle stretch in your lower back and buttock. Then, move your knee toward the shoulder that is on the opposite side from your leg. This is hip adduction and internal rotation. ? Hold your leg in this position by holding on to the front of your knee. 3. Hold this position for __________ seconds. 4. Slowly return to the starting position. 5. Repeat with your other leg. Repeat __________ times. Complete this exercise __________ times a day. Prone extension on elbows  1. Lie on your abdomen on a firm surface. A bed may be too soft for this exercise. 2. Prop yourself up on your elbows. 3. Use your arms to help lift your  chest up until you feel a gentle stretch in your abdomen and your lower back. ? This will place some of your body weight on your elbows. If this is uncomfortable, try stacking pillows under your chest. ? Your hips should stay down,  against the surface that you are lying on. Keep your hip and back muscles relaxed. 4. Hold this position for __________ seconds. 5. Slowly relax your upper body and return to the starting position. Repeat __________ times. Complete this exercise __________ times a day. Strengthening exercises These exercises build strength and endurance in your back. Endurance is the ability to use your muscles for a long time, even after they get tired. Pelvic tilt This exercise strengthens the muscles that lie deep in the abdomen. 1. Lie on your back on a firm surface. Bend your knees and keep your feet flat on the floor. 2. Tense your abdominal muscles. Tip your pelvis up toward the ceiling and flatten your lower back into the floor. ? To help with this exercise, you may place a small towel under your lower back and try to push your back into the towel. 3. Hold this position for __________ seconds. 4. Let your muscles relax completely before you repeat this exercise. Repeat __________ times. Complete this exercise __________ times a day. Alternating arm and leg raises  1. Get on your hands and knees on a firm surface. If you are on a hard floor, you may want to use padding, such as an exercise mat, to cushion your knees. 2. Line up your arms and legs. Your hands should be directly below your shoulders, and your knees should be directly below your hips. 3. Lift your left leg behind you. At the same time, raise your right arm and straighten it in front of you. ? Do not lift your leg higher than your hip. ? Do not lift your arm higher than your shoulder. ? Keep your abdominal and back muscles tight. ? Keep your hips facing the ground. ? Do not arch your back. ? Keep your balance carefully, and do not hold your breath. 4. Hold this position for __________ seconds. 5. Slowly return to the starting position. 6. Repeat with your right leg and your left arm. Repeat __________ times. Complete this exercise  __________ times a day. Posture and body mechanics Good posture and healthy body mechanics can help to relieve stress in your body's tissues and joints. Body mechanics refers to the movements and positions of your body while you do your daily activities. Posture is part of body mechanics. Good posture means:  Your spine is in its natural S-curve position (neutral).  Your shoulders are pulled back slightly.  Your head is not tipped forward. Follow these guidelines to improve your posture and body mechanics in your everyday activities. Standing   When standing, keep your spine neutral and your feet about hip width apart. Keep a slight bend in your knees. Your ears, shoulders, and hips should line up.  When you do a task in which you stand in one place for a long time, place one foot up on a stable object that is 2-4 inches (5-10 cm) high, such as a footstool. This helps keep your spine neutral. Sitting   When sitting, keep your spine neutral and keep your feet flat on the floor. Use a footrest, if necessary, and keep your thighs parallel to the floor. Avoid rounding your shoulders, and avoid tilting your head forward.  When working at a desk or a  computer, keep your desk at a height where your hands are slightly lower than your elbows. Slide your chair under your desk so you are close enough to maintain good posture.  When working at a computer, place your monitor at a height where you are looking straight ahead and you do not have to tilt your head forward or downward to look at the screen. Resting  When lying down and resting, avoid positions that are most painful for you.  If you have pain with activities such as sitting, bending, stooping, or squatting, lie in a position in which your body does not bend very much. For example, avoid curling up on your side with your arms and knees near your chest (fetal position).  If you have pain with activities such as standing for a long time or  reaching with your arms, lie with your spine in a neutral position and bend your knees slightly. Try the following positions: ? Lying on your side with a pillow between your knees. ? Lying on your back with a pillow under your knees. Lifting   When lifting objects, keep your feet at least shoulder width apart and tighten your abdominal muscles.  Bend your knees and hips and keep your spine neutral. It is important to lift using the strength of your legs, not your back. Do not lock your knees straight out.  Always ask for help to lift heavy or awkward objects. This information is not intended to replace advice given to you by your health care provider. Make sure you discuss any questions you have with your health care provider. Document Revised: 03/17/2019 Document Reviewed: 12/15/2018 Elsevier Patient Education  [place].

## 2020-05-04 LAB — SPECIMEN STATUS REPORT

## 2020-05-04 LAB — URINE CULTURE

## 2020-05-13 ENCOUNTER — Telehealth: Payer: Self-pay | Admitting: *Deleted

## 2020-05-13 NOTE — Telephone Encounter (Signed)
Pt called to let us know that she had found a tick on her and removed it. States that the area is itchy. I advised patient to keep area clean and use hydrocortisone cream to help. Pt not having any other symptoms at this time. Pt concerned about needing abx. Advised I would check with provider and let her know. Pt has no other questions at this time.

## 2020-05-14 ENCOUNTER — Other Ambulatory Visit: Payer: Self-pay | Admitting: Women's Health

## 2020-05-14 DIAGNOSIS — Z3482 Encounter for supervision of other normal pregnancy, second trimester: Secondary | ICD-10-CM

## 2020-05-14 DIAGNOSIS — L299 Pruritus, unspecified: Secondary | ICD-10-CM

## 2020-05-15 ENCOUNTER — Other Ambulatory Visit: Payer: PRIVATE HEALTH INSURANCE

## 2020-05-17 ENCOUNTER — Ambulatory Visit (INDEPENDENT_AMBULATORY_CARE_PROVIDER_SITE_OTHER): Payer: PRIVATE HEALTH INSURANCE | Admitting: *Deleted

## 2020-05-17 ENCOUNTER — Encounter: Payer: Self-pay | Admitting: *Deleted

## 2020-05-17 VITALS — BP 115/73 | HR 113 | Ht 63.0 in | Wt 226.5 lb

## 2020-05-17 DIAGNOSIS — Z1389 Encounter for screening for other disorder: Secondary | ICD-10-CM

## 2020-05-17 DIAGNOSIS — Z3A24 24 weeks gestation of pregnancy: Secondary | ICD-10-CM

## 2020-05-17 DIAGNOSIS — O0990 Supervision of high risk pregnancy, unspecified, unspecified trimester: Secondary | ICD-10-CM

## 2020-05-17 DIAGNOSIS — Z0130 Encounter for examination of blood pressure without abnormal findings: Secondary | ICD-10-CM

## 2020-05-17 DIAGNOSIS — Z331 Pregnant state, incidental: Secondary | ICD-10-CM

## 2020-05-17 LAB — COMPREHENSIVE METABOLIC PANEL
ALT: 7 IU/L (ref 0–32)
AST: 6 IU/L (ref 0–40)
Albumin/Globulin Ratio: 1.2 (ref 1.2–2.2)
Albumin: 3.4 g/dL — ABNORMAL LOW (ref 3.9–5.0)
Alkaline Phosphatase: 81 IU/L (ref 48–121)
BUN/Creatinine Ratio: 7 — ABNORMAL LOW (ref 9–23)
BUN: 5 mg/dL — ABNORMAL LOW (ref 6–20)
Bilirubin Total: 0.5 mg/dL (ref 0.0–1.2)
CO2: 19 mmol/L — ABNORMAL LOW (ref 20–29)
Calcium: 8.7 mg/dL (ref 8.7–10.2)
Chloride: 104 mmol/L (ref 96–106)
Creatinine, Ser: 0.72 mg/dL (ref 0.57–1.00)
GFR calc Af Amer: 134 mL/min/{1.73_m2} (ref >59)
GFR calc non Af Amer: 116 mL/min/{1.73_m2} (ref >59)
Globulin, Total: 2.8 g/dL (ref 1.5–4.5)
Glucose: 81 mg/dL (ref 65–99)
Potassium: 4.4 mmol/L (ref 3.5–5.2)
Sodium: 138 mmol/L (ref 134–144)
Total Protein: 6.2 g/dL (ref 6.0–8.5)

## 2020-05-17 LAB — POCT URINALYSIS DIPSTICK OB
Blood, UA: NEGATIVE
Glucose, UA: NEGATIVE
Ketones, UA: NEGATIVE
Leukocytes, UA: NEGATIVE
Nitrite, UA: NEGATIVE
POC,PROTEIN,UA: NEGATIVE

## 2020-05-17 LAB — BILE ACIDS, TOTAL: Bile Acids Total: 4.8 umol/L (ref 0.0–10.0)

## 2020-05-17 NOTE — Progress Notes (Signed)
   NURSE VISIT- BLOOD PRESSURE CHECK  SUBJECTIVE:  Melissa Livingston is a 24 y.o. G3P2002 female here for BP check. She is [redacted]w[redacted]d pregnant    HYPERTENSION ROS:  Pregnant/postpartum:  . Severe headaches that don't go away with tylenol/other medicines: Yes  . Visual changes (seeing spots/double/blurred vision) No  . Severe pain under right breast breast or in center of upper chest No  . Severe nausea/vomiting No  . Taking medicines as instructed yes   OBJECTIVE:  BP 115/73   Pulse (!) 113   Ht 5\' 3"  (1.6 m)   Wt 226 lb 8 oz (102.7 kg)   LMP 11/26/2019   BMI 40.12 kg/m   Appearance alert, well appearing, and in no distress.  ASSESSMENT: Pregnancy [redacted]w[redacted]d  blood pressure check  PLAN: Discussed with Knute Neu, CNM, [facility]   Recommendations: no changes needed   Follow-up: as scheduled   Levy Pupa  05/17/2020 12:56 PM   Chart reviewed for nurse visit. Agree with plan of care. Pt had complained of headache and feet and face swelling. Checked bp at work and was 147/90, so was instructed to come in for bp check. Normal here. Itching all over, checked bile acids and CMP 2d ago- all normal. Feels like legs and lips are swelling. States she got bit by a tick recently on her upper Lt thigh- site looks normal- no erythema/bullseye/s/s infection. Gave info on headaches, itching, to try benadryl for swelling/itching. Keep next appt as scheduled.  Roma Schanz, [place] 05/17/2020 2:04 PM

## 2020-05-28 ENCOUNTER — Encounter: Payer: Self-pay | Admitting: Women's Health

## 2020-05-28 ENCOUNTER — Other Ambulatory Visit: Payer: PRIVATE HEALTH INSURANCE

## 2020-05-28 ENCOUNTER — Other Ambulatory Visit: Payer: Self-pay

## 2020-05-28 ENCOUNTER — Ambulatory Visit (INDEPENDENT_AMBULATORY_CARE_PROVIDER_SITE_OTHER): Payer: PRIVATE HEALTH INSURANCE | Admitting: Women's Health

## 2020-05-28 ENCOUNTER — Ambulatory Visit (INDEPENDENT_AMBULATORY_CARE_PROVIDER_SITE_OTHER): Payer: PRIVATE HEALTH INSURANCE

## 2020-05-28 VITALS — BP 139/87 | HR 105 | Wt 224.0 lb

## 2020-05-28 DIAGNOSIS — O0990 Supervision of high risk pregnancy, unspecified, unspecified trimester: Secondary | ICD-10-CM

## 2020-05-28 DIAGNOSIS — Z23 Encounter for immunization: Secondary | ICD-10-CM

## 2020-05-28 DIAGNOSIS — Z3A26 26 weeks gestation of pregnancy: Secondary | ICD-10-CM | POA: Diagnosis not present

## 2020-05-28 DIAGNOSIS — Z1389 Encounter for screening for other disorder: Secondary | ICD-10-CM

## 2020-05-28 DIAGNOSIS — O26892 Other specified pregnancy related conditions, second trimester: Secondary | ICD-10-CM

## 2020-05-28 DIAGNOSIS — O10912 Unspecified pre-existing hypertension complicating pregnancy, second trimester: Secondary | ICD-10-CM

## 2020-05-28 DIAGNOSIS — O0992 Supervision of high risk pregnancy, unspecified, second trimester: Secondary | ICD-10-CM | POA: Diagnosis not present

## 2020-05-28 DIAGNOSIS — L299 Pruritus, unspecified: Secondary | ICD-10-CM

## 2020-05-28 DIAGNOSIS — Z331 Pregnant state, incidental: Secondary | ICD-10-CM

## 2020-05-28 DIAGNOSIS — Z131 Encounter for screening for diabetes mellitus: Secondary | ICD-10-CM

## 2020-05-28 DIAGNOSIS — O10919 Unspecified pre-existing hypertension complicating pregnancy, unspecified trimester: Secondary | ICD-10-CM

## 2020-05-28 LAB — POCT URINALYSIS DIPSTICK OB
Blood, UA: NEGATIVE
Glucose, UA: NEGATIVE
Ketones, UA: NEGATIVE
Leukocytes, UA: NEGATIVE
Nitrite, UA: NEGATIVE
POC,PROTEIN,UA: NEGATIVE

## 2020-05-28 NOTE — Progress Notes (Signed)
HIGH-RISK PREGNANCY VISIT Patient name: Melissa Livingston MRN [id number]  Date of birth: Feb 10, 1996 Chief Complaint:   Routine Prenatal Visit  History of Present Illness:   Melissa Livingston is a 24 y.o. G81P2002 female at [redacted]w[redacted]d with an Estimated Date of Delivery: 09/01/20 being seen today for ongoing management of a high-risk pregnancy complicated by chronic hypertension currently on Labetalol 200mg  BID, prev c/s x 2.  Today she reports still itching on hands/feet- mainly at night, otc measures aren't helping. Bile acids normal 2wks ago. BPs at home 130s/80s. Occ headaches, no other sx. Taking ASA as directed.  Depression screen [facility] 2/9 05/28/2020 02/22/2020 06/05/2019 01/03/2018 11/03/2016  Decreased Interest 0 0 1 0 0  Down, Depressed, Hopeless 0 0 0 0 0  PHQ - 2 Score 0 0 1 0 0  Altered sleeping 2 1 - - -  Tired, decreased energy 1 2 - - -  Change in appetite 1 3 - - -  Feeling bad or failure about yourself  0 0 - - -  Trouble concentrating 0 0 - - -  Moving slowly or fidgety/restless 0 0 - - -  Suicidal thoughts 0 0 - - -  PHQ-9 Score 4 6 - - -  Difficult doing work/chores Not difficult at all - - - -  Some recent data might be hidden    Contractions: Irritability. Vag. Bleeding: None.  Movement: Present. denies leaking of fluid.  Review of Systems:   Pertinent items are noted in HPI Denies abnormal vaginal discharge w/ itching/odor/irritation, headaches, visual changes, shortness of breath, chest pain, abdominal pain, severe nausea/vomiting, or problems with urination or bowel movements unless otherwise stated above. Pertinent History Reviewed:  Reviewed past medical,surgical, social, obstetrical and family history.  Reviewed problem list, medications and allergies. Physical Assessment:   Vitals:   05/28/20 0944  BP: 139/87  Pulse: (!) 105  Weight: 224 lb (101.6 kg)  Body mass index is 39.68 kg/m.           Physical Examination:   General appearance: alert, well appearing, and in no  distress  Mental status: alert, oriented to person, place, and time  Skin: warm & dry   Extremities: Edema: Trace    Cardiovascular: normal heart rate noted  Respiratory: normal respiratory effort, no distress  Abdomen: gravid, soft, non-tender  Pelvic: Cervical exam deferred         Fetal Status: Fetal Heart Rate (bpm): 150 u/s   Movement: Present    Fetal Surveillance Testing today: [place] 31+5 wks,cephalic,cx 3.1 cm,posterior placenta gr 0,afi 15.7 cm,fhr 150 bpm,EFW 932 g 43%  Chaperone: n/a    Results for orders placed or performed in visit on 05/28/20 (from the past 24 hour(s))  POC Urinalysis Dipstick OB   Collection Time: 05/28/20  9:49 AM  Result Value Ref Range   Color, UA     Clarity, UA     Glucose, UA Negative Negative   Bilirubin, UA     Ketones, UA n    Spec Grav, UA     Blood, UA n    pH, UA     POC,PROTEIN,UA Negative Negative, Trace, Small (1+), Moderate (2+), Large (3+), 4+   Urobilinogen, UA     Nitrite, UA n    Leukocytes, UA Negative Negative   Appearance     Odor      Assessment & Plan:  1) High-risk pregnancy G3P2002 at [redacted]w[redacted]d with an Estimated Date of Delivery: 09/01/20   2)  CHTN, stable on Labetalol 200mg  BID, ASA, pt has noticed increasing slightly at home- same here today- will check pre-e labs, reviewed pre-e s/s, reasons to seek care.   3) Itching hands/feet worse at night, bile acids normal 2wks ago, will repeat today if lab able to add to 1st tube drawn (prior to glucola), if not- come back one day this week fasting  4) Prev c/s x 2> for RCS  Meds: No orders of the defined types were placed in this encounter.   Labs/procedures today: pn2, tdap, u/s  Treatment Plan:  Growth u/s q4wks    2x/wk testing nst/sono @ 32wks     Deliver 38-39wks (37wks or prn if poor control)____   Reviewed: Preterm labor symptoms and general obstetric precautions including but not limited to vaginal bleeding, contractions, leaking of fluid and fetal movement were  reviewed in detail with the patient.  All questions were answered. Has home bp cuff. Continue checking BID, if SBP.150 or DBP>95 let us know  Follow-up: Return in about 4 weeks (around 06/25/2020) for HROB, US:EFW, in person, MD or CNM (one day this week for fasting labs).  Orders Placed This Encounter  Procedures  . US OB Follow Up  . Tdap vaccine greater than or equal to 7yo IM  . Comprehensive metabolic panel  . Bile acids, total  . POC Urinalysis Dipstick OB   Roma Schanz CNM, [facility] 05/28/2020 10:29 AM

## 2020-05-28 NOTE — Progress Notes (Signed)
[place] 32+0 wks,cephalic,cx 3.1 cm,posterior placenta gr 0,afi 15.7 cm,fhr 150 bpm,EFW 932 g 43%

## 2020-05-28 NOTE — Patient Instructions (Signed)
Melissa Livingston, I greatly value your feedback.  If you receive a survey following your visit with [place] today, we appreciate you taking the time to fill it out.  Thanks, Knute Neu, CNM, WHNP-BC   [facility] [place] [facility] ([place], [place] [address]) Entrance C, located off of [address] parking  Go to [email or web address] to register for FREE online childbirth classes   Call the office [phone]) or go to [facility] if:  You begin to have strong, frequent contractions  Your water breaks.  Sometimes it is a big gush of fluid, sometimes it is just a trickle that keeps getting your panties wet or running down your legs  You have vaginal bleeding.  It is normal to have a small amount of spotting if your cervix was checked.   You don't feel your baby moving like normal.  If you don't, get you something to eat and drink and lay down and focus on feeling your baby move.  You should feel at least 10 movements in 2 hours.  If you don't, you should call the office or go to [facility].    Tdap Vaccine  It is recommended that you get the Tdap vaccine during the third trimester of EACH pregnancy to help protect your baby from getting pertussis (whooping cough)  27-36 weeks is the BEST time to do this so that you can pass the protection on to your baby. During pregnancy is better than after pregnancy, but if you are unable to get it during pregnancy it will be offered at the hospital.   You can get this vaccine with [place], at the health department, your family doctor, or some local pharmacies  Everyone who will be around your baby should also be up-to-date on their vaccines before the baby comes. Adults (who are not pregnant) only need 1 dose of Tdap during adulthood.   [place] Pediatricians/Family Doctors:  [facility] [phone]                 [place]  [phone] (usually not accepting new patients unless you have family there already, you are always welcome to call and ask)       [facility] Department [phone]       [facility] Pediatricians/Family Doctors:   [facility]: [phone]  [facility]: [phone]  [facility]: [place] Doctors:   [facility]: [facility]: [place]:  [place]: [phone]   Home Blood Pressure Monitoring for Patients   Your provider has recommended that you check your blood pressure (BP) at least once a week at home. If you do not have a blood pressure cuff at home, one will be provided for you. Contact your provider if you have not received your monitor within 1 week.   Helpful Tips for Accurate Home Blood Pressure Checks  . Don't smoke, exercise, or drink caffeine 30 minutes before checking your BP . Use the restroom before checking your BP (a full bladder can raise your pressure) . Relax in a comfortable upright chair . Feet on the ground . Left arm resting comfortably on a flat surface at the level of your heart . Legs uncrossed . Back supported . Sit quietly and don't talk . Place the cuff on your  bare arm . Adjust snuggly, so that only two fingertips can fit between your skin and the top of the cuff . Check 2 readings separated by at least one minute . Keep a log of your BP readings . For a visual, please reference this diagram: http://ccnc.care/bpdiagram  Provider Name: [facility]     Phone: [phone]  Zone 1: ALL CLEAR  Continue to monitor your symptoms:  . BP reading is less than 140 (top number) or less than 90 (bottom number)  . No right upper stomach pain . No headaches or seeing spots . No feeling nauseated or throwing up . No swelling in face and hands  Zone 2: CAUTION Call your  doctor's office for any of the following:  . BP reading is greater than 140 (top number) or greater than 90 (bottom number)  . Stomach pain under your ribs in the middle or right side . Headaches or seeing spots . Feeling nauseated or throwing up . Swelling in face and hands  Zone 3: EMERGENCY  Seek immediate medical care if you have any of the following:  . BP reading is greater than160 (top number) or greater than 110 (bottom number) . Severe headaches not improving with Tylenol . Serious difficulty catching your breath . Any worsening symptoms from Zone 2   Third Trimester of Pregnancy The third trimester is from week 29 through week 42, months 7 through 9. The third trimester is a time when the fetus is growing rapidly. At the end of the ninth month, the fetus is about 20 inches in length and weighs 6-10 pounds.  BODY CHANGES Your body goes through many changes during pregnancy. The changes vary from woman to woman.   Your weight will continue to increase. You can expect to gain 25-35 pounds (11-16 kg) by the end of the pregnancy.  You may begin to get stretch marks on your hips, abdomen, and breasts.  You may urinate more often because the fetus is moving lower into your pelvis and pressing on your bladder.  You may develop or continue to have heartburn as a result of your pregnancy.  You may develop constipation because certain hormones are causing the muscles that push waste through your intestines to slow down.  You may develop hemorrhoids or swollen, bulging veins (varicose veins).  You may have pelvic pain because of the weight gain and pregnancy hormones relaxing your joints between the bones in your pelvis. Backaches may result from overexertion of the muscles supporting your posture.  You may have changes in your hair. These can include thickening of your hair, rapid growth, and changes in texture. Some women also have hair loss during or after pregnancy, or hair that  feels dry or thin. Your hair will most likely return to normal after your baby is born.  Your breasts will continue to grow and be tender. A yellow discharge may leak from your breasts called colostrum.  Your belly button may stick out.  You may feel short of breath because of your expanding uterus.  You may notice the fetus "dropping," or moving lower in your abdomen.  You may have a bloody mucus discharge. This usually occurs a few days to a week before labor begins.  Your cervix becomes thin and soft (effaced) near your due date. WHAT TO EXPECT AT YOUR PRENATAL EXAMS  You will have prenatal exams every 2 weeks until week 36. Then, you will have weekly prenatal exams. During a routine prenatal visit:  You will be weighed to make sure you and the fetus are growing normally.  Your blood pressure is taken.  Your abdomen will be measured to track your baby's growth.  The fetal heartbeat will be listened to.  Any test results from the previous visit will be discussed.  You may have a cervical check near your due date to see if you have effaced. At around 36 weeks, your caregiver will check your cervix. At the same time, your caregiver will also perform a test on the secretions of the vaginal tissue. This test is to determine if a type of bacteria, Group B streptococcus, is present. Your caregiver will explain this further. Your caregiver may ask you:  What your birth plan is.  How you are feeling.  If you are feeling the baby move.  If you have had any abnormal symptoms, such as leaking fluid, bleeding, severe headaches, or abdominal cramping.  If you have any questions. Other tests or screenings that may be performed during your third trimester include:  Blood tests that check for low iron levels (anemia).  Fetal testing to check the health, activity level, and growth of the fetus. Testing is done if you have certain medical conditions or if there are problems during the  pregnancy. FALSE LABOR You may feel small, irregular contractions that eventually go away. These are called Braxton Hicks contractions, or false labor. Contractions may last for hours, days, or even weeks before true labor sets in. If contractions come at regular intervals, intensify, or become painful, it is best to be seen by your caregiver.  SIGNS OF LABOR   Menstrual-like cramps.  Contractions that are 5 minutes apart or less.  Contractions that start on the top of the uterus and spread down to the lower abdomen and back.  A sense of increased pelvic pressure or back pain.  A watery or bloody mucus discharge that comes from the vagina. If you have any of these signs before the 37th week of pregnancy, call your caregiver right away. You need to go to the hospital to get checked immediately. HOME CARE INSTRUCTIONS   Avoid all smoking, herbs, alcohol, and unprescribed drugs. These chemicals affect the formation and growth of the baby.  Follow your caregiver's instructions regarding medicine use. There are medicines that are either safe or unsafe to take during pregnancy.  Exercise only as directed by your caregiver. Experiencing uterine cramps is a good sign to stop exercising.  Continue to eat regular, healthy meals.  Wear a good support bra for breast tenderness.  Do not use hot tubs, steam rooms, or saunas.  Wear your seat belt at all times when driving.  Avoid raw meat, uncooked cheese, cat litter boxes, and soil used by cats. These carry germs that can cause birth defects in the baby.  Take your prenatal vitamins.  Try taking a stool softener (if your caregiver approves) if you develop constipation. Eat more high-fiber foods, such as fresh vegetables or fruit and whole grains. Drink plenty of fluids to keep your urine clear or pale yellow.  Take warm sitz baths to soothe any pain or discomfort caused by hemorrhoids. Use hemorrhoid cream if your caregiver approves.  If you  develop varicose veins, wear support hose. Elevate your feet for 15 minutes, 3-4 times a day. Limit salt in your diet.  Avoid heavy lifting, wear low heal shoes, and practice good posture.  Rest a lot with your legs elevated if you have leg cramps or low  back pain.  Visit your dentist if you have not gone during your pregnancy. Use a soft toothbrush to brush your teeth and be gentle when you floss.  A sexual relationship may be continued unless your caregiver directs you otherwise.  Do not travel far distances unless it is absolutely necessary and only with the approval of your caregiver.  Take prenatal classes to understand, practice, and ask questions about the labor and delivery.  Make a trial run to the hospital.  Pack your hospital bag.  Prepare the baby's nursery.  Continue to go to all your prenatal visits as directed by your caregiver. SEEK MEDICAL CARE IF:  You are unsure if you are in labor or if your water has broken.  You have dizziness.  You have mild pelvic cramps, pelvic pressure, or nagging pain in your abdominal area.  You have persistent nausea, vomiting, or diarrhea.  You have a bad smelling vaginal discharge.  You have pain with urination. SEEK IMMEDIATE MEDICAL CARE IF:   You have a fever.  You are leaking fluid from your vagina.  You have spotting or bleeding from your vagina.  You have severe abdominal cramping or pain.  You have rapid weight loss or gain.  You have shortness of breath with chest pain.  You notice sudden or extreme swelling of your face, hands, ankles, feet, or legs.  You have not felt your baby move in over an hour.  You have severe headaches that do not go away with medicine.  You have vision changes. Document Released: 11/17/2001 Document Revised: 11/28/2013 Document Reviewed: 01/24/2013 [facility] Patient Information 2015 [place], [place]. This information is not intended to replace advice given to you by your health  care provider. Make sure you discuss any questions you have with your health care provider.

## 2020-05-29 ENCOUNTER — Other Ambulatory Visit: Payer: Self-pay | Admitting: Women's Health

## 2020-05-29 LAB — COMPREHENSIVE METABOLIC PANEL
ALT: 9 IU/L (ref 0–32)
AST: 12 IU/L (ref 0–40)
Albumin/Globulin Ratio: 1.4 (ref 1.2–2.2)
Albumin: 3.6 g/dL — ABNORMAL LOW (ref 3.9–5.0)
Alkaline Phosphatase: 87 IU/L (ref 48–121)
BUN/Creatinine Ratio: 6 — ABNORMAL LOW (ref 9–23)
BUN: 4 mg/dL — ABNORMAL LOW (ref 6–20)
Bilirubin Total: 0.3 mg/dL (ref 0.0–1.2)
CO2: 21 mmol/L (ref 20–29)
Calcium: 8.4 mg/dL — ABNORMAL LOW (ref 8.7–10.2)
Chloride: 103 mmol/L (ref 96–106)
Creatinine, Ser: 0.64 mg/dL (ref 0.57–1.00)
GFR calc Af Amer: 142 mL/min/{1.73_m2} (ref >59)
GFR calc non Af Amer: 124 mL/min/{1.73_m2} (ref >59)
Globulin, Total: 2.6 g/dL (ref 1.5–4.5)
Glucose: 84 mg/dL (ref 65–99)
Potassium: 3.7 mmol/L (ref 3.5–5.2)
Sodium: 136 mmol/L (ref 134–144)
Total Protein: 6.2 g/dL (ref 6.0–8.5)

## 2020-05-29 LAB — CBC
Hematocrit: 33.1 % — ABNORMAL LOW (ref 34.0–46.6)
Hemoglobin: 9.9 g/dL — ABNORMAL LOW (ref 11.1–15.9)
MCH: 22.1 pg — ABNORMAL LOW (ref 26.6–33.0)
MCHC: 29.9 g/dL — ABNORMAL LOW (ref 31.5–35.7)
MCV: 74 fL — ABNORMAL LOW (ref 79–97)
Platelets: 324 10*3/uL (ref 150–450)
RBC: 4.48 x10E6/uL (ref 3.77–5.28)
RDW: 14.8 % (ref 11.7–15.4)
WBC: 7.8 10*3/uL (ref 3.4–10.8)

## 2020-05-29 LAB — RPR: RPR Ser Ql: NONREACTIVE

## 2020-05-29 LAB — GLUCOSE TOLERANCE, 2 HOURS W/ 1HR
Glucose, 1 hour: 114 mg/dL (ref 65–179)
Glucose, 2 hour: 86 mg/dL (ref 65–152)
Glucose, Fasting: 77 mg/dL (ref 65–91)

## 2020-05-29 LAB — ANTIBODY SCREEN: Antibody Screen: NEGATIVE

## 2020-05-29 LAB — HIV ANTIBODY (ROUTINE TESTING W REFLEX): HIV Screen 4th Generation wRfx: NONREACTIVE

## 2020-05-29 MED ORDER — FERROUS SULFATE 325 (65 FE) MG PO TABS: 325.0000 mg | ORAL_TABLET | Freq: Two times a day (BID) | ORAL | 3 refills | Status: DC

## 2020-05-30 LAB — BILE ACIDS, TOTAL: Bile Acids Total: 4.7 umol/L (ref 0.0–10.0)

## 2020-05-31 ENCOUNTER — Other Ambulatory Visit: Payer: PRIVATE HEALTH INSURANCE

## 2020-06-12 ENCOUNTER — Encounter: Payer: Self-pay | Admitting: Student

## 2020-06-12 ENCOUNTER — Ambulatory Visit (INDEPENDENT_AMBULATORY_CARE_PROVIDER_SITE_OTHER): Payer: PRIVATE HEALTH INSURANCE | Admitting: Student

## 2020-06-12 VITALS — BP 138/76 | HR 116 | Wt 227.8 lb

## 2020-06-12 DIAGNOSIS — E669 Obesity, unspecified: Secondary | ICD-10-CM

## 2020-06-12 DIAGNOSIS — Z98891 History of uterine scar from previous surgery: Secondary | ICD-10-CM

## 2020-06-12 DIAGNOSIS — O0993 Supervision of high risk pregnancy, unspecified, third trimester: Secondary | ICD-10-CM

## 2020-06-12 DIAGNOSIS — O99213 Obesity complicating pregnancy, third trimester: Secondary | ICD-10-CM

## 2020-06-12 DIAGNOSIS — Z331 Pregnant state, incidental: Secondary | ICD-10-CM

## 2020-06-12 DIAGNOSIS — O0990 Supervision of high risk pregnancy, unspecified, unspecified trimester: Secondary | ICD-10-CM

## 2020-06-12 DIAGNOSIS — O99891 Other specified diseases and conditions complicating pregnancy: Secondary | ICD-10-CM

## 2020-06-12 DIAGNOSIS — R42 Dizziness and giddiness: Secondary | ICD-10-CM

## 2020-06-12 DIAGNOSIS — Z3A38 38 weeks gestation of pregnancy: Secondary | ICD-10-CM

## 2020-06-12 DIAGNOSIS — Z1389 Encounter for screening for other disorder: Secondary | ICD-10-CM

## 2020-06-12 DIAGNOSIS — O10913 Unspecified pre-existing hypertension complicating pregnancy, third trimester: Secondary | ICD-10-CM

## 2020-06-12 LAB — POCT URINALYSIS DIPSTICK OB
Blood, UA: NEGATIVE
Glucose, UA: NEGATIVE
Ketones, UA: NEGATIVE
Leukocytes, UA: NEGATIVE
Nitrite, UA: NEGATIVE
POC,PROTEIN,UA: NEGATIVE

## 2020-06-12 LAB — GLUCOSE, POCT (MANUAL RESULT ENTRY): POC Glucose: 104 mg/dl — AB (ref 70–99)

## 2020-06-12 NOTE — Patient Instructions (Signed)
Healthy Weight Gain During Pregnancy, Adult A certain amount of weight gain during pregnancy is normal and healthy. How much weight you should gain depends on your overall health and a measurement called BMI (body mass index). BMI is an estimate of your body fat based on your height and weight. You can use an online calculator to figure out your BMI, or you can ask your health care provider to calculate it for you at your next visit. Your recommended pregnancy weight gain is based on your pre-pregnancy BMI. General guidelines for a healthy total weight gain during pregnancy are listed below. If your BMI at or before the start of your pregnancy is:  Less than 18.5 (underweight), you should gain 28-40 lb (13-18 kg).  18.5-24.9 (normal weight), you should gain 25-35 lb (11-16 kg).  25-29.9 (overweight), you should gain 15-25 lb (7-11 kg).  30 or higher (obese), you should gain 11-20 lb (5-9 kg). These ranges vary depending on your individual health. If you are carrying more than one baby (multiples), it may be safe to gain more weight than these recommendations. If you gain less weight than recommended, that may be safe as long as your baby is growing and developing normally. How can unhealthy weight gain affect me and my baby? Gaining too much weight during pregnancy can lead to pregnancy complications, such as:  A temporary form of diabetes that develops during pregnancy (gestational diabetes).  High blood pressure during pregnancy and protein in your urine (preeclampsia).  High blood pressure during pregnancy without protein in your urine (gestational hypertension).  Your baby having a high weight at birth, which may: ? Raise your risk of having a more difficult delivery or a surgical delivery (cesarean delivery, or C-section). ? Raise your child's risk of developing obesity during childhood. Not gaining enough weight can be life-threatening for your baby, and it may raise your baby's chances  of:  Being born early (preterm).  Growing more slowly than normal during pregnancy (growth restriction).  Having a low weight at birth. What actions can I take to gain a healthy amount of weight during pregnancy? General instructions  Keep track of your weight gain during pregnancy.  Take over-the-counter and prescription medicines only as told by your health care provider. Take all prenatal supplements as directed.  Keep all health care visits during pregnancy (prenatal visits). These visits are a good time to discuss your weight gain. Your health care provider will weigh you at each visit to make sure you are gaining a healthy amount of weight. Nutrition   Eat a balanced, nutrient-rich diet. Eat plenty of: ? Fruits and vegetables, such as berries and broccoli. ? Whole grains, such as millet, barley, whole-wheat breads and cereals, and oatmeal. ? Low-fat dairy products or non-dairy products such as almond milk or rice milk. ? Protein foods, such as lean meat, chicken, eggs, and legumes (such as peas, beans, soybeans, and lentils).  Avoid foods that are fried or have a lot of fat, salt (sodium), or sugar.  Drink enough fluid to keep your urine pale yellow.  Choose healthy snack and drink options when you are at work or on the go: ? Drink water. Avoid soda, sports drinks, and juices that have added sugar. ? Avoid drinks with caffeine, such as coffee and energy drinks. ? Eat snacks that are high in protein, such as nuts, protein bars, and low-fat yogurt. ? Carry convenient snacks in your purse that do not need refrigeration, such as a pack of   trail mix, an apple, or a granola bar.  If you need help improving your diet, work with a health care provider or a diet and nutrition specialist (dietitian). Activity   Exercise regularly, as told by your health care provider. ? If you were active before becoming pregnant, you may be able to continue your regular fitness activities. ? If  you were not active before pregnancy, you may gradually build up to exercising for 30 or more minutes on most days of the week. This may include walking, swimming, or yoga.  Ask your health care provider what activities are safe for you. Talk with your health care provider about whether you may need to be excused from certain school or work activities. Where to find more information Learn more about managing your weight gain during pregnancy from:  American Pregnancy Association: www.americanpregnancy.org  U.[place]. Department of Agriculture pregnancy weight gain calculator: [employer] Summary  Too much weight gain during pregnancy can lead to complications for you and your baby.  Find out your pre-pregnancy BMI to determine how much weight gain is healthy for you.  Eat nutritious foods and stay active.  Keep all of your prenatal visits as told by your health care provider. This information is not intended to replace advice given to you by your health care provider. Make sure you discuss any questions you have with your health care provider. Document Revised: 08/16/2019 Document Reviewed: 08/13/2017 Elsevier Patient Education  [place].

## 2020-06-12 NOTE — Progress Notes (Signed)
PRENATAL VISIT NOTE  Subjective:  Melissa Livingston is a 24 y.o. G3P2002 at 40w3dbeing seen today for ongoing prenatal care.  She is currently monitored for the following issues for this high-risk pregnancy and has Asthma; Irritable bowel syndrome; Depression; Rectal bleeding; GERD (gastroesophageal reflux disease); History of gestational hypertension; Hematochezia; Congenital malrotation of intestine; Normocytic anemia; S/P cesarean section; Abnormal Pap smear of cervix; Acute left-sided low back pain with left-sided sciatica; Morbid obesity ([place]; Chronic pain of both knees; Constipation; Headache disorder; Abdominal pain; Supervision of high risk pregnancy, antepartum; and Chronic hypertension affecting pregnancy on their problem list.  Patient reports seeing spots when she stands up, as wel as feeling "bad". She reports eating a protein donut for breakfast, rice and chicken for lunch and wheat thins for snack. She is tearful. She reports that she feels dizzy sometimes at work and that she can feel her blood pressure go up at work. When she checks it, it is sometimes about 138/92. She is taking iron pills, ASA, labetelol BID. She passed 2 hour GTT. .Marland Kitchen Contractions: Irritability.  .  Movement: Present. Denies leaking of fluid.   The following portions of the patient's history were reviewed and updated as appropriate: allergies, current medications, past family history, past medical history, past social history, past surgical history and problem list.   Objective:   Vitals:   06/12/20 1623  BP: 138/76  Pulse: (!) 116  Weight: 227 lb 12.8 oz (103.3 kg)   Blood glucose POCT is 104.    Fetal Status: Fetal Heart Rate (bpm): 139   Movement: Present     General:  Alert, oriented and cooperative. Patient is in no acute distress.  Skin: Skin is warm and dry. No rash noted.   Cardiovascular: Normal heart rate noted  Respiratory: Normal respiratory effort, no problems with respiration noted  Abdomen:  Soft, gravid, appropriate for gestational age.  Pain/Pressure: Present     Pelvic: Cervical exam deferred        Extremities: Normal range of motion.  Edema: Trace  Mental Status: Normal mood and affect. Normal behavior. Normal judgment and thought content.   Assessment and Plan:  Pregnancy: G3P2002 at 260w3d. Supervision of high risk pregnancy, antepartum -Patient was work-in due to [employer] concerning complaints.  -Will draw Pre-e labs today -continue to take ASA, BP meds and iron.  -encouraged her to eat more protein filled snacks; dizziness and feeling unwell may be due to not eating enough.  -Therapeutic listening provided regarding patient stressors.  -Blood glucose is 104.  -[place] will [place] BP cuff. Recommended that patient take BP at home three times a week, not at work when she is stressed. Put values into baby Rx. Call clinic or come to [facility] if BP is greater than 140/90 and she has headache, floating spots, all-over body swelling.   4. Dizzy - CBC - Comp Met (CMET) - Protein / creatinine ratio, urine - Urinalysis, Routine w reflex microscopic - POCT glucose (manual entry)  Preterm labor symptoms and general obstetric precautions including but not limited to vaginal bleeding, contractions, leaking of fluid and fetal movement were reviewed in detail with the patient. Please refer to After Visit Summary for other counseling recommendations.   No follow-ups on file.  Future Appointments  Date Time Provider [place]7/28/2021  9:45 AM [facility] - FTOBGYN US[place]WH-FTIMG None  07/03/2020 10:50 AM ShMyrtis SerCNM [facility]-FT FTZetta Bills  [place] Bears  Alvis Lemmings, CNM

## 2020-06-13 LAB — URINALYSIS, ROUTINE W REFLEX MICROSCOPIC
Bilirubin, UA: NEGATIVE
Glucose, UA: NEGATIVE
Ketones, UA: NEGATIVE
Leukocytes,UA: NEGATIVE
Nitrite, UA: NEGATIVE
Protein,UA: NEGATIVE
RBC, UA: NEGATIVE
Specific Gravity, UA: 1.018 (ref 1.005–1.030)
Urobilinogen, Ur: 1 mg/dL (ref 0.2–1.0)
pH, UA: 7 (ref 5.0–7.5)

## 2020-06-13 LAB — CBC
Hematocrit: 30.5 % — ABNORMAL LOW (ref 34.0–46.6)
Hemoglobin: 9.3 g/dL — ABNORMAL LOW (ref 11.1–15.9)
MCH: 22.2 pg — ABNORMAL LOW (ref 26.6–33.0)
MCHC: 30.5 g/dL — ABNORMAL LOW (ref 31.5–35.7)
MCV: 73 fL — ABNORMAL LOW (ref 79–97)
Platelets: 314 10*3/uL (ref 150–450)
RBC: 4.18 x10E6/uL (ref 3.77–5.28)
RDW: 14.7 % (ref 11.7–15.4)
WBC: 8.7 10*3/uL (ref 3.4–10.8)

## 2020-06-13 LAB — COMPREHENSIVE METABOLIC PANEL
ALT: 9 IU/L (ref 0–32)
AST: 9 IU/L (ref 0–40)
Albumin/Globulin Ratio: 1.3 (ref 1.2–2.2)
Albumin: 3.5 g/dL — ABNORMAL LOW (ref 3.9–5.0)
Alkaline Phosphatase: 95 IU/L (ref 48–121)
BUN/Creatinine Ratio: 10 (ref 9–23)
BUN: 5 mg/dL — ABNORMAL LOW (ref 6–20)
Bilirubin Total: 0.4 mg/dL (ref 0.0–1.2)
CO2: 22 mmol/L (ref 20–29)
Calcium: 8.3 mg/dL — ABNORMAL LOW (ref 8.7–10.2)
Chloride: 102 mmol/L (ref 96–106)
Creatinine, Ser: 0.52 mg/dL — ABNORMAL LOW (ref 0.57–1.00)
GFR calc Af Amer: 152 mL/min/{1.73_m2} (ref >59)
GFR calc non Af Amer: 132 mL/min/{1.73_m2} (ref >59)
Globulin, Total: 2.8 g/dL (ref 1.5–4.5)
Glucose: 90 mg/dL (ref 65–99)
Potassium: 3.8 mmol/L (ref 3.5–5.2)
Sodium: 135 mmol/L (ref 134–144)
Total Protein: 6.3 g/dL (ref 6.0–8.5)

## 2020-06-13 LAB — PROTEIN / CREATININE RATIO, URINE
Creatinine, Urine: 124.8 mg/dL
Protein, Ur: 14.7 mg/dL
Protein/Creat Ratio: 118 mg/g creat (ref 0–200)

## 2020-06-17 ENCOUNTER — Telehealth: Payer: Self-pay | Admitting: Obstetrics & Gynecology

## 2020-06-17 ENCOUNTER — Telehealth: Payer: Self-pay | Admitting: *Deleted

## 2020-06-17 NOTE — Telephone Encounter (Signed)
Mail box full @ 11:59 am. [employer]

## 2020-06-17 NOTE — Telephone Encounter (Signed)
Pt advised on Kim's recommendations, check BP BID. Call us or go to [facility] if severe headache that don't go away with Tylenol, seeing spots/blurred vision, pain in right side or center of chest and voiced understanding. Michie

## 2020-06-17 NOTE — Telephone Encounter (Signed)
[facility] called with BP reading. BP 152 90. This reading is from yesterday. Pt also reported headache, nausea and swelling in the hands and feet. I called pt this am and had her check her BP. This morning, it was 134/73. Pt didn't have any headache, nausea or swelling. Melissa Livingston

## 2020-06-17 NOTE — Telephone Encounter (Signed)
Patient mom would like nurse/doctor to give her a call. Pt mom did not state what she wanted to discuss. Preferred to talk with clinical team member.

## 2020-06-18 ENCOUNTER — Ambulatory Visit (INDEPENDENT_AMBULATORY_CARE_PROVIDER_SITE_OTHER): Payer: PRIVATE HEALTH INSURANCE | Admitting: Women's Health

## 2020-06-18 ENCOUNTER — Encounter: Payer: Self-pay | Admitting: Women's Health

## 2020-06-18 ENCOUNTER — Telehealth: Payer: Self-pay | Admitting: *Deleted

## 2020-06-18 VITALS — BP 142/77 | HR 100 | Wt 228.0 lb

## 2020-06-18 DIAGNOSIS — O10913 Unspecified pre-existing hypertension complicating pregnancy, third trimester: Secondary | ICD-10-CM

## 2020-06-18 DIAGNOSIS — O10919 Unspecified pre-existing hypertension complicating pregnancy, unspecified trimester: Secondary | ICD-10-CM

## 2020-06-18 DIAGNOSIS — Z331 Pregnant state, incidental: Secondary | ICD-10-CM

## 2020-06-18 DIAGNOSIS — Z3A29 29 weeks gestation of pregnancy: Secondary | ICD-10-CM

## 2020-06-18 DIAGNOSIS — O0993 Supervision of high risk pregnancy, unspecified, third trimester: Secondary | ICD-10-CM

## 2020-06-18 DIAGNOSIS — R519 Headache, unspecified: Secondary | ICD-10-CM

## 2020-06-18 DIAGNOSIS — Z1389 Encounter for screening for other disorder: Secondary | ICD-10-CM

## 2020-06-18 LAB — POCT URINALYSIS DIPSTICK OB
Blood, UA: NEGATIVE
Glucose, UA: NEGATIVE
Ketones, UA: NEGATIVE
Leukocytes, UA: NEGATIVE
Nitrite, UA: NEGATIVE
POC,PROTEIN,UA: NEGATIVE

## 2020-06-18 MED ORDER — BUTALBITAL-APAP-CAFFEINE 50-325-40 MG PO TABS
1.0000 | ORAL_TABLET | ORAL | 0 refills | Status: DC | PRN
Start: 2020-06-18 — End: 2020-07-27

## 2020-06-18 NOTE — Patient Instructions (Signed)
[name], I greatly value your feedback.  If you receive a survey following your visit with [place] today, we appreciate you taking the time to fill it out.  Thanks, Knute Neu, CNM, WHNP-BC  [facility] [place] [facility] ([place],  [id number]) Entrance C, located off of [address] parking   Go to [email or web address] to register for FREE online childbirth classes    Call the office [phone]) or go to [facility] if:  You begin to have strong, frequent contractions  Your water breaks.  Sometimes it is a big gush of fluid, sometimes it is just a trickle that keeps getting your panties wet or running down your legs  You have vaginal bleeding.  It is normal to have a small amount of spotting if your cervix was checked.   You don't feel your baby moving like normal.  If you don't, get you something to eat and drink and lay down and focus on feeling your baby move.  You should feel at least 10 movements in 2 hours.  If you don't, you should call the office or go to [facility].   Call the office [phone]) or go to [facility] hospital for these signs of pre-eclampsia:  Severe headache that does not go away with Tylenol  Visual changes- seeing spots, double, blurred vision  Pain under your right breast or upper abdomen that does not go away with Tums or heartburn medicine  Nausea and/or vomiting  Severe swelling in your hands, feet, and face    Home Blood Pressure Monitoring for Patients   Your provider has recommended that you check your blood pressure (BP) at least once a week at home. If you do not have a blood pressure cuff at home, one will be provided for you. Contact your provider if you have not received your monitor within 1 week.   Helpful Tips for Accurate Home Blood Pressure Checks  . Don't smoke, exercise, or drink caffeine 30 minutes before checking your BP . Use the restroom before checking your BP (a full bladder  can raise your pressure) . Relax in a comfortable upright chair . Feet on the ground . Left arm resting comfortably on a flat surface at the level of your heart . Legs uncrossed . Back supported . Sit quietly and don't talk . Place the cuff on your bare arm . Adjust snuggly, so that only two fingertips can fit between your skin and the top of the cuff . Check 2 readings separated by at least one minute . Keep a log of your BP readings . For a visual, please reference this diagram: http://ccnc.care/bpdiagram  Provider Name: [facility]     Phone: [phone]  Zone 1: ALL CLEAR  Continue to monitor your symptoms:  . BP reading is less than 140 (top number) or less than 90 (bottom number)  . No right upper stomach pain . No headaches or seeing spots . No feeling nauseated or throwing up . No swelling in face and hands  Zone 2: CAUTION Call your doctor's office for any of the following:  . BP reading is greater than 140 (top number) or greater than 90 (bottom number)  . Stomach pain under your ribs in the middle or right side . Headaches or seeing spots . Feeling nauseated or throwing up . Swelling in face and hands  Zone 3: EMERGENCY  Seek immediate medical care if you have any of  the following:  . BP reading is greater than160 (top number) or greater than 110 (bottom number) . Severe headaches not improving with Tylenol . Serious difficulty catching your breath . Any worsening symptoms from Zone 2  Preterm Labor and Birth Information  The normal length of a pregnancy is 39-41 weeks. Preterm labor is when labor starts before 37 completed weeks of pregnancy. What are the risk factors for preterm labor? Preterm labor is more likely to occur in women who:  Have certain infections during pregnancy such as a bladder infection, sexually transmitted infection, or infection inside the uterus (chorioamnionitis).  Have a shorter-than-normal cervix.  Have gone into preterm  labor before.  Have had surgery on their cervix.  Are younger than age 24 or older than age 36.  Are African American.  Are pregnant with twins or multiple babies (multiple gestation).  Take street drugs or smoke while pregnant.  Do not gain enough weight while pregnant.  Became pregnant shortly after having been pregnant. What are the symptoms of preterm labor? Symptoms of preterm labor include:  Cramps similar to those that can happen during a menstrual period. The cramps may happen with diarrhea.  Pain in the abdomen or lower back.  Regular uterine contractions that may feel like tightening of the abdomen.  A feeling of increased pressure in the pelvis.  Increased watery or bloody mucus discharge from the vagina.  Water breaking (ruptured amniotic sac). Why is it important to recognize signs of preterm labor? It is important to recognize signs of preterm labor because babies who are born prematurely may not be fully developed. This can put them at an increased risk for:  Long-term (chronic) heart and lung problems.  Difficulty immediately after birth with regulating body systems, including blood sugar, body temperature, heart rate, and breathing rate.  Bleeding in the brain.  Cerebral palsy.  Learning difficulties.  Death. These risks are highest for babies who are born before 43 weeks of pregnancy. How is preterm labor treated? Treatment depends on the length of your pregnancy, your condition, and the health of your baby. It may involve: 1. Having a stitch (suture) placed in your cervix to prevent your cervix from opening too early (cerclage). 2. Taking or being given medicines, such as: ? Hormone medicines. These may be given early in pregnancy to help support the pregnancy. ? Medicine to stop contractions. ? Medicines to help mature the baby's lungs. These may be prescribed if the risk of delivery is high. ? Medicines to prevent your baby from developing  cerebral palsy. If the labor happens before 34 weeks of pregnancy, you may need to stay in the hospital. What should I do if I think I am in preterm labor? If you think that you are going into preterm labor, call your health care provider right away. How can I prevent preterm labor in future pregnancies? To increase your chance of having a full-term pregnancy:  Do not use any tobacco products, such as cigarettes, chewing tobacco, and e-cigarettes. If you need help quitting, ask your health care provider.  Do not use street drugs or medicines that have not been prescribed to you during your pregnancy.  Talk with your health care provider before taking any herbal supplements, even if you have been taking them regularly.  Make sure you gain a healthy amount of weight during your pregnancy.  Watch for infection. If you think that you might have an infection, get it checked right away.  Make sure to  tell your health care provider if you have gone into preterm labor before. This information is not intended to replace advice given to you by your health care provider. Make sure you discuss any questions you have with your health care provider. Document Revised: 03/17/2019 Document Reviewed: 04/15/2016 Elsevier Patient Education  [place].

## 2020-06-18 NOTE — Telephone Encounter (Signed)
Baby scripts called. Pt's BP last night was 142/83, then 138/78. Pt reported headache, swelling in hands and feet and nausea and vomiting. I called pt this am and had her check her BP. BP was 141/79. Pt reports headache and swelling in feet and hands. I spoke with Maudie Mercury B.,CNM and pt was advised to come in today to be seen. Pt was advised to come in @ 10:10 this am but pt was in [place] and couldn't get here then. To come in @ 1:30 pm. JSY

## 2020-06-18 NOTE — Progress Notes (Signed)
Work-in HIGH-RISK PREGNANCY VISIT Patient name: Melissa Livingston MRN [id number]  Date of birth: Oct 23, 1996 Chief Complaint:   Routine Prenatal Visit (Bp is elevated, headache, swelling)  History of Present Illness:   Melissa AYANNAH Livingston is a 24 y.o. G42P2002 female at [redacted]w[redacted]d with an Estimated Date of Delivery: 09/01/20 being seen today for ongoing management of a high-risk pregnancy complicated by chronic hypertension currently on Labetalol 200mg  BID-took dose at 0900.  Today she is being seen as a work-in for reports of elevated bp's at home 140s/70-80s, generalized headache x >1d not relieved by apap. Took 1,000mg  apap at 0900, eased it some, but still there. Denies visual changes, ruq/epigastric pain, n/v. Taking asa as directed. Working 40hr/wk, 8hr shifts as CMA, on feet all the time. Good fm, denies uc's, vb or lof. Had neg pre-e labs last week.  Depression screen [facility] 2/9 05/28/2020 02/22/2020 06/05/2019 01/03/2018 11/03/2016  Decreased Interest 0 0 1 0 0  Down, Depressed, Hopeless 0 0 0 0 0  PHQ - 2 Score 0 0 1 0 0  Altered sleeping 2 1 - - -  Tired, decreased energy 1 2 - - -  Change in appetite 1 3 - - -  Feeling bad or failure about yourself  0 0 - - -  Trouble concentrating 0 0 - - -  Moving slowly or fidgety/restless 0 0 - - -  Suicidal thoughts 0 0 - - -  PHQ-9 Score 4 6 - - -  Difficult doing work/chores Not difficult at all - - - -  Some recent data might be hidden    Contractions: Irritability. Vag. Bleeding: None.  Movement: Present. denies leaking of fluid.  Review of Systems:   Pertinent items are noted in HPI Denies abnormal vaginal discharge w/ itching/odor/irritation, headaches, visual changes, shortness of breath, chest pain, abdominal pain, severe nausea/vomiting, or problems with urination or bowel movements unless otherwise stated above. Pertinent History Reviewed:  Reviewed past medical,surgical, social, obstetrical and family history.  Reviewed problem list, medications and  allergies. Physical Assessment:   Vitals:   06/18/20 1346  BP: (!) 142/77  Pulse: 100  Weight: 228 lb (103.4 kg)  Body mass index is 40.39 kg/m.           Physical Examination:   General appearance: alert, well appearing, and in no distress  Mental status: alert, oriented to person, place, and time  Skin: warm & dry   Extremities: Edema: Trace    Cardiovascular: normal heart rate noted  Respiratory: normal respiratory effort, no distress  Abdomen: gravid, soft, non-tender  Pelvic: Cervical exam deferred         Fetal Status: Fetal Heart Rate (bpm): 154 Fundal Height: 29 cm Movement: Present    Fetal Surveillance Testing today: doppler   Chaperone: n/a    Results for orders placed or performed in visit on 06/18/20 (from the past 24 hour(s))  POC Urinalysis Dipstick OB   Collection Time: 06/18/20  1:47 PM  Result Value Ref Range   Color, UA     Clarity, UA     Glucose, UA Negative Negative   Bilirubin, UA     Ketones, UA neg    Spec Grav, UA     Blood, UA neg    pH, UA     POC,PROTEIN,UA Negative Negative, Trace, Small (1+), Moderate (2+), Large (3+), 4+   Urobilinogen, UA     Nitrite, UA neg    Leukocytes, UA Negative Negative   Appearance  Odor      Assessment & Plan:  1) High-risk pregnancy G3P2002 at [redacted]w[redacted]d with an Estimated Date of Delivery: 09/01/20   2) CHTN, stable on Labetalol 200mg  BID, continue ASA  3) Headache, only eased slightly w/ apap 1,000mg -last taken at 0900. Check pre-e labs again today. No proteinuria. Increase po water intake. Rx fioricet, take as soon as she gets home, lay down and rest, if doesn't resolve go to [facility] for further eval. Reviewed pre-e s/s, reasons to seek care. Gave work note to cut hours to 4/day.   Meds:  Meds ordered this encounter  Medications  . butalbital-acetaminophen-caffeine (FIORICET) 50-325-40 MG tablet    Sig: Take 1 tablet by mouth every 4 (four) hours as needed for headache or migraine.    Dispense:  20 tablet     Refill:  0    Order Specific Question:   Supervising Provider    Answer:   Florian Buff [[id number]]    Labs/procedures today: pre-e labs  Treatment Plan:  Growth u/s q4wks    2x/wk testing nst/sono @ 32wks     Deliver 38-39wks (37wks or prn if poor control)____   Reviewed: Preterm labor symptoms and general obstetric precautions including but not limited to vaginal bleeding, contractions, leaking of fluid and fetal movement were reviewed in detail with the patient.  All questions were answered. Has home bp cuff.   Follow-up: Return in about 1 week (around 06/25/2020) for [place], MD or CNM.  Orders Placed This Encounter  Procedures  . CBC  . Comprehensive metabolic panel  . Protein / creatinine ratio, urine  . POC Urinalysis Dipstick OB   Roma Schanz CNM, [facility] 06/18/2020 2:11 PM

## 2020-06-19 LAB — COMPREHENSIVE METABOLIC PANEL
ALT: 6 IU/L (ref 0–32)
AST: 9 IU/L (ref 0–40)
Albumin/Globulin Ratio: 1.3 (ref 1.2–2.2)
Albumin: 3.6 g/dL — ABNORMAL LOW (ref 3.9–5.0)
Alkaline Phosphatase: 95 IU/L (ref 48–121)
BUN/Creatinine Ratio: 7 — ABNORMAL LOW (ref 9–23)
BUN: 4 mg/dL — ABNORMAL LOW (ref 6–20)
Bilirubin Total: 0.3 mg/dL (ref 0.0–1.2)
CO2: 21 mmol/L (ref 20–29)
Calcium: 8.8 mg/dL (ref 8.7–10.2)
Chloride: 105 mmol/L (ref 96–106)
Creatinine, Ser: 0.59 mg/dL (ref 0.57–1.00)
GFR calc Af Amer: 146 mL/min/{1.73_m2} (ref >59)
GFR calc non Af Amer: 127 mL/min/{1.73_m2} (ref >59)
Globulin, Total: 2.8 g/dL (ref 1.5–4.5)
Glucose: 95 mg/dL (ref 65–99)
Potassium: 4 mmol/L (ref 3.5–5.2)
Sodium: 138 mmol/L (ref 134–144)
Total Protein: 6.4 g/dL (ref 6.0–8.5)

## 2020-06-19 LAB — CBC
Hematocrit: 30.3 % — ABNORMAL LOW (ref 34.0–46.6)
Hemoglobin: 9.4 g/dL — ABNORMAL LOW (ref 11.1–15.9)
MCH: 22.8 pg — ABNORMAL LOW (ref 26.6–33.0)
MCHC: 31 g/dL — ABNORMAL LOW (ref 31.5–35.7)
MCV: 73 fL — ABNORMAL LOW (ref 79–97)
Platelets: 324 10*3/uL (ref 150–450)
RBC: 4.13 x10E6/uL (ref 3.77–5.28)
RDW: 14.6 % (ref 11.7–15.4)
WBC: 9.7 10*3/uL (ref 3.4–10.8)

## 2020-06-19 LAB — PROTEIN / CREATININE RATIO, URINE
Creatinine, Urine: 43.5 mg/dL
Protein, Ur: 4 mg/dL
Protein/Creat Ratio: 92 mg/g creat (ref 0–200)

## 2020-06-20 ENCOUNTER — Telehealth: Payer: Self-pay | Admitting: *Deleted

## 2020-06-20 NOTE — Telephone Encounter (Signed)
Babyscripts called and informed us that BP was 161/85. I called patient she reports no symptoms. She has had a headache but it is relieved with Fioricet. Patient was driving while I was talking with her. She will do a repeat BP once she gets to work and let me know how it is.

## 2020-06-24 ENCOUNTER — Telehealth: Payer: Self-pay | Admitting: *Deleted

## 2020-06-24 NOTE — Telephone Encounter (Signed)
Called patient in regards to BP reading reported from Babyscripts of 140/86 and 146/74 and report of headache, nausea and swelling.  Patient states these are not new symptoms.  She is also taking the Fioricet which was prescribed last week for her headaches and that seems to be helping. Advised patient to let us know if she starts to feel worse or if new symptoms arise. Pt verbalized understanding and agreeable to plan.

## 2020-06-25 ENCOUNTER — Other Ambulatory Visit: Payer: PRIVATE HEALTH INSURANCE

## 2020-06-25 ENCOUNTER — Encounter: Payer: PRIVATE HEALTH INSURANCE | Admitting: Women's Health

## 2020-06-26 ENCOUNTER — Encounter: Payer: Self-pay | Admitting: Obstetrics and Gynecology

## 2020-06-26 ENCOUNTER — Ambulatory Visit (INDEPENDENT_AMBULATORY_CARE_PROVIDER_SITE_OTHER): Payer: PRIVATE HEALTH INSURANCE | Admitting: Obstetrics and Gynecology

## 2020-06-26 VITALS — BP 123/75 | HR 140 | Wt 227.4 lb

## 2020-06-26 DIAGNOSIS — Z1389 Encounter for screening for other disorder: Secondary | ICD-10-CM

## 2020-06-26 DIAGNOSIS — O10913 Unspecified pre-existing hypertension complicating pregnancy, third trimester: Secondary | ICD-10-CM

## 2020-06-26 DIAGNOSIS — Z331 Pregnant state, incidental: Secondary | ICD-10-CM

## 2020-06-26 DIAGNOSIS — O0990 Supervision of high risk pregnancy, unspecified, unspecified trimester: Secondary | ICD-10-CM

## 2020-06-26 DIAGNOSIS — R519 Headache, unspecified: Secondary | ICD-10-CM

## 2020-06-26 DIAGNOSIS — Z3A30 30 weeks gestation of pregnancy: Secondary | ICD-10-CM

## 2020-06-26 DIAGNOSIS — O0993 Supervision of high risk pregnancy, unspecified, third trimester: Secondary | ICD-10-CM

## 2020-06-26 LAB — POCT URINALYSIS DIPSTICK OB
Blood, UA: NEGATIVE
Glucose, UA: NEGATIVE
Ketones, UA: NEGATIVE
Leukocytes, UA: NEGATIVE
Nitrite, UA: NEGATIVE
POC,PROTEIN,UA: NEGATIVE

## 2020-06-26 NOTE — Progress Notes (Signed)
Patient ID: [name], female   DOB: [birth year], 24 y.o.   MRN: [id number]    [facility] PREGNANCY VISIT Patient name: Melissa Livingston MRN [id number]  Date of birth: 1996-07-07 Chief Complaint:   High Risk Gestation (headaches and shortness of breath)  History of Present Illness:   Melissa Livingston is a 24 y.o. G16P2002 female at [redacted]w[redacted]d with an Estimated Date of Delivery: 09/01/20 being seen today for ongoing management of a high-risk pregnancy complicated by [facility] currently on Labetalol 200 mg BID Today she reports that she is still having headaches. Fioricet works for a little bit, but it wears off quickly. She has had these symptoms during her last two pregnancies as well. She has occasional episodes of nausea and sweating.  The patient is accompanied by the FOB. She would like to have another baby but her partner is thinking about a vasectomy.  Contractions: Irregular. Vag. Bleeding: None.  Movement: Present. denies leaking of fluid.  Review of Systems:   Pertinent items are noted in HPI Denies abnormal vaginal discharge w/ itching/odor/irritation, headaches, visual changes, shortness of breath, chest pain, abdominal pain, severe nausea/vomiting, or problems with urination or bowel movements unless otherwise stated above. Pertinent History Reviewed:  Reviewed past medical,surgical, social, obstetrical and family history.  Reviewed problem list, medications and allergies. Physical Assessment:   Vitals:   06/26/20 1525  BP: 123/75  Pulse: (!) 140  Weight: 227 lb 6.4 oz (103.1 kg)  Body mass index is 40.28 kg/m.           Physical Examination:   General appearance: alert, well appearing, and in no distress and oriented to person, place, and time  Mental status: alert, oriented to person, place, and time, normal mood, behavior, speech, dress, motor activity, and thought processes, affect appropriate to mood  Skin: warm & dry   Extremities: Edema: Trace    Cardiovascular: normal heart rate  noted  Respiratory: normal respiratory effort, no distress  Abdomen: gravid, soft, non-tender 33 cm  Pelvic: Cervical exam deferred         Fetal Status: Fetal Heart Rate (bpm): 147 Fundal Height: 33 cm Movement: Present    Fetal Surveillance Testing today: none   Results for orders placed or performed in visit on 06/26/20 (from the past 24 hour(s))  POC Urinalysis Dipstick OB   Collection Time: 06/26/20  3:22 PM  Result Value Ref Range   Color, UA     Clarity, UA     Glucose, UA Negative Negative   Bilirubin, UA     Ketones, UA neg    Spec Grav, UA     Blood, UA neg    pH, UA     POC,PROTEIN,UA Negative Negative, Trace, Small (1+), Moderate (2+), Large (3+), 4+   Urobilinogen, UA     Nitrite, UA neg    Leukocytes, UA Negative Negative   Appearance     Odor      Assessment & Plan:  1) High-risk pregnancy G3P2002 at [redacted]w[redacted]d with an Estimated Date of Delivery: 09/01/20   2) CHTN, stable on Labetalol 200mg  BID, continue ASA  3) Headaches, on Fioricet   Meds: No orders of the defined types were placed in this encounter.  Labs/procedures today: none  Treatment Plan: Growth u/s q4wks    2x/wk testing nst/sono @ 32wks     Deliver 38-39wks (37wks or prn if poor control)  Reviewed: Preterm labor symptoms and general obstetric precautions including but not limited to vaginal bleeding, contractions,  leaking of fluid and fetal movement were reviewed in detail with the patient.  All questions were answered. Has home bp cuff. Check bp weekly, let us know if >140/90.   Follow-up: 1 wk for u/s growth.  By signing my name below, I, De Burrs, attest that this documentation has been prepared under the direction and in the presence of Jonnie Kind, MD. Electronically Signed: De Burrs, Medical Scribe. 06/26/20. 4:20 PM.  I personally performed the services described in this documentation, which was SCRIBED in my presence. The recorded information has been reviewed and considered  accurate. It has been edited as necessary during review. Jonnie Kind, MD

## 2020-07-03 ENCOUNTER — Ambulatory Visit (INDEPENDENT_AMBULATORY_CARE_PROVIDER_SITE_OTHER): Payer: PRIVATE HEALTH INSURANCE

## 2020-07-03 ENCOUNTER — Other Ambulatory Visit: Payer: Self-pay | Admitting: Women's Health

## 2020-07-03 ENCOUNTER — Ambulatory Visit (INDEPENDENT_AMBULATORY_CARE_PROVIDER_SITE_OTHER): Payer: PRIVATE HEALTH INSURANCE | Admitting: Advanced Practice Midwife

## 2020-07-03 ENCOUNTER — Encounter: Payer: Self-pay | Admitting: Advanced Practice Midwife

## 2020-07-03 VITALS — BP 138/81 | HR 119 | Wt 225.5 lb

## 2020-07-03 DIAGNOSIS — Z3A31 31 weeks gestation of pregnancy: Secondary | ICD-10-CM

## 2020-07-03 DIAGNOSIS — Z1389 Encounter for screening for other disorder: Secondary | ICD-10-CM

## 2020-07-03 DIAGNOSIS — O0992 Supervision of high risk pregnancy, unspecified, second trimester: Secondary | ICD-10-CM

## 2020-07-03 DIAGNOSIS — Z331 Pregnant state, incidental: Secondary | ICD-10-CM

## 2020-07-03 DIAGNOSIS — O0990 Supervision of high risk pregnancy, unspecified, unspecified trimester: Secondary | ICD-10-CM

## 2020-07-03 DIAGNOSIS — O0993 Supervision of high risk pregnancy, unspecified, third trimester: Secondary | ICD-10-CM | POA: Diagnosis not present

## 2020-07-03 DIAGNOSIS — O10913 Unspecified pre-existing hypertension complicating pregnancy, third trimester: Secondary | ICD-10-CM

## 2020-07-03 DIAGNOSIS — O10919 Unspecified pre-existing hypertension complicating pregnancy, unspecified trimester: Secondary | ICD-10-CM

## 2020-07-03 DIAGNOSIS — I10 Essential (primary) hypertension: Secondary | ICD-10-CM

## 2020-07-03 LAB — POCT URINALYSIS DIPSTICK OB
Blood, UA: NEGATIVE
Glucose, UA: NEGATIVE
Ketones, UA: NEGATIVE
Leukocytes, UA: NEGATIVE
Nitrite, UA: NEGATIVE
POC,PROTEIN,UA: NEGATIVE

## 2020-07-03 MED ORDER — OXYCODONE-ACETAMINOPHEN 5-325 MG PO TABS: 1.0000 | ORAL_TABLET | ORAL | 0 refills | Status: DC | PRN

## 2020-07-03 MED ORDER — ONDANSETRON 4 MG PO TBDP
4.0000 mg | ORAL_TABLET | Freq: Three times a day (TID) | ORAL | 1 refills | Status: DC | PRN
Start: 2020-07-03 — End: 2020-07-27

## 2020-07-03 NOTE — Progress Notes (Signed)
HIGH-RISK PREGNANCY VISIT Patient name: Melissa Livingston MRN [id number]  Date of birth: 09-04-1996 Chief Complaint:   High Risk Gestation (back pain, pelvic pain; braxton hick's contractions)  History of Present Illness:   Melissa Livingston is a 24 y.o. G87P2002 female at [redacted]w[redacted]d with an Estimated Date of Delivery: 09/01/20 being seen today for ongoing management of a high-risk pregnancy complicated by chronic hypertension currently on Labetalol 200mg  tid.  Today she reports occ nausea; H/A at times not relieved by Fioricet; intermittent low back pain (belly band doesn't help) and low abd pain; intermittent loose stools x past 3 days.  Depression screen [facility] 2/9 05/28/2020 02/22/2020 06/05/2019 01/03/2018 11/03/2016  Decreased Interest 0 0 1 0 0  Down, Depressed, Hopeless 0 0 0 0 0  PHQ - 2 Score 0 0 1 0 0  Altered sleeping 2 1 - - -  Tired, decreased energy 1 2 - - -  Change in appetite 1 3 - - -  Feeling bad or failure about yourself  0 0 - - -  Trouble concentrating 0 0 - - -  Moving slowly or fidgety/restless 0 0 - - -  Suicidal thoughts 0 0 - - -  PHQ-9 Score 4 6 - - -  Difficult doing work/chores Not difficult at all - - - -  Some recent data might be hidden    Contractions: Irregular. Vag. Bleeding: None.  Movement: Present. denies leaking of fluid.  Review of Systems:   Pertinent items are noted in HPI Denies abnormal vaginal discharge w/ itching/odor/irritation, headaches, visual changes, shortness of breath, chest pain, abdominal pain, severe nausea/vomiting, or problems with urination or bowel movements unless otherwise stated above. Pertinent History Reviewed:  Reviewed past medical,surgical, social, obstetrical and family history.  Reviewed problem list, medications and allergies. Physical Assessment:   Vitals:   07/03/20 1023  BP: (!) 138/81  Pulse: (!) 119  Weight: (!) 225 lb 8 oz (102.3 kg)  Body mass index is 39.95 kg/m.           Physical Examination:   General appearance:  alert, well appearing, and in no distress  Mental status: alert, oriented to person, place, and time  Skin: warm & dry   Extremities: Edema: Trace    Cardiovascular: normal heart rate noted  Respiratory: normal respiratory effort, no distress  Abdomen: gravid, soft, non-tender  Pelvic: Cervical exam deferred         Fetal Status: Fetal Heart Rate (bpm): 142 u/s   Movement: Present    Fetal Surveillance Testing today: [place] 58+0 wks,cephalic,fhr 998 bpm,posterior placenta gr 0,afi 12.3 cm.RI .63,.70,.58,.57=35%,BPP 8/8,EFW 1887 G 58%     Results for orders placed or performed in visit on 07/03/20 (from the past 24 hour(s))  POC Urinalysis Dipstick OB   Collection Time: 07/03/20 10:24 AM  Result Value Ref Range   Color, UA     Clarity, UA     Glucose, UA Negative Negative   Bilirubin, UA     Ketones, UA neg    Spec Grav, UA     Blood, UA neg    pH, UA     POC,PROTEIN,UA Negative Negative, Trace, Small (1+), Moderate (2+), Large (3+), 4+   Urobilinogen, UA     Nitrite, UA neg    Leukocytes, UA Negative Negative   Appearance     Odor      Assessment & Plan:  1) High-risk pregnancy G3P2002 at [redacted]w[redacted]d with an Estimated Date of Delivery: 09/01/20  2) cHTN, stable on Labetalol 200mg  tid  3) H/A, not necessarily associated with elevated BPs, but at times she does have DBP of 140; will give #20 Percocet to use for breakthrough pain only if doesn't get relief from Fioricet  Meds:  Meds ordered this encounter  Medications  . ondansetron (ZOFRAN ODT) 4 MG disintegrating tablet    Sig: Take 1 tablet (4 mg total) by mouth every 8 (eight) hours as needed for nausea or vomiting.    Dispense:  30 tablet    Refill:  1    Order Specific Question:   Supervising Provider    Answer:   Jonnie Kind [[id number]]  . oxyCODONE-acetaminophen (PERCOCET/ROXICET) 5-325 MG tablet    Sig: Take 1 tablet by mouth every 4 (four) hours as needed for severe pain.    Dispense:  20 tablet    Refill:  0     Order Specific Question:   Supervising Provider    Answer:   Marjory Lies    Labs/procedures today: BPP w/ dopplers  Treatment Plan:  Start biweekly testing next week (BPP/NST) with visits q 2 wks; plans on JVF for rLTCS (to schedule at next visit)  Reviewed: Preterm labor symptoms and general obstetric precautions including but not limited to vaginal bleeding, contractions, leaking of fluid and fetal movement were reviewed in detail with the patient.  All questions were answered. Has home bp cuff. Check bp weekly, let us know if >140/90.   Follow-up: Return for starting next week, NST & BPP weekly; no visit next week, but needs HROB (JVF) in 2wks then in 4wks.  Orders Placed This Encounter  Procedures  . POC Urinalysis Dipstick OB   Melissa Livingston 07/03/2020 11:23 AM

## 2020-07-03 NOTE — Progress Notes (Signed)
[place] 32+9 wks,cephalic,fhr 191 bpm,posterior placenta gr 0,afi 12.3 cm.RI .63,.70,.58,.57=35%,BPP 8/8,EFW 1887 G 58%

## 2020-07-03 NOTE — Patient Instructions (Signed)
Melissa Livingston, I greatly value your feedback.  If you receive a survey following your visit with [place] today, we appreciate you taking the time to fill it out.  Thanks, Derrill Memo CNM   [place] at [facility] ([place], [place] [address]) Entrance C, located off of [address] parking  Go to [email or web address] to register for FREE online childbirth classes   Call the office [phone]) or go to [facility] if:  You begin to have strong, frequent contractions  Your water breaks.  Sometimes it is a big gush of fluid, sometimes it is just a trickle that keeps getting your panties wet or running down your legs  You have vaginal bleeding.  It is normal to have a small amount of spotting if your cervix was checked.   You don't feel your baby moving like normal.  If you don't, get you something to eat and drink and lay down and focus on feeling your baby move.  You should feel at least 10 movements in 2 hours.  If you don't, you should call the office or go to [facility].    Tdap Vaccine  It is recommended that you get the Tdap vaccine during the third trimester of EACH pregnancy to help protect your baby from getting pertussis (whooping cough)  27-36 weeks is the BEST time to do this so that you can pass the protection on to your baby. During pregnancy is better than after pregnancy, but if you are unable to get it during pregnancy it will be offered at the hospital.   You can get this vaccine with [place], at the health department, your family doctor, or some local pharmacies  Everyone who will be around your baby should also be up-to-date on their vaccines before the baby comes. Adults (who are not pregnant) only need 1 dose of Tdap during adulthood.   [place] Pediatricians/Family Doctors:  [facility] [phone]                 [place] [phone]  (usually not accepting new patients unless you have family there already, you are always welcome to call and ask)       [facility] Department [phone]       [facility] Pediatricians/Family Doctors:   [facility]: [phone]  [facility]: [phone]  [facility]: [place] Doctors:   [facility]: [facility]: [place]:  [place]: [phone]   Home Blood Pressure Monitoring for Patients   Your provider has recommended that you check your blood pressure (BP) at least once a week at home. If you do not have a blood pressure cuff at home, one will be provided for you. Contact your provider if you have not received your monitor within 1 week.   Helpful Tips for Accurate Home Blood Pressure Checks  . Don't smoke, exercise, or drink caffeine 30 minutes before checking your BP . Use the restroom before checking your BP (a full bladder can raise your pressure) . Relax in a comfortable upright chair . Feet on the ground . Left arm resting comfortably on a flat surface at the level of your heart . Legs uncrossed . Back supported . Sit quietly and don't talk . Place the cuff on your bare  arm . Adjust snuggly, so that only two fingertips can fit between your skin and the top of the cuff . Check 2 readings separated by at least one minute . Keep a log of your BP readings . For a visual, please reference this diagram: http://ccnc.care/bpdiagram  Provider Name: [facility]     Phone: [phone]  Zone 1: ALL CLEAR  Continue to monitor your symptoms:  . BP reading is less than 140 (top number) or less than 90 (bottom number)  . No right upper stomach pain . No headaches or seeing spots . No feeling nauseated or throwing up . No swelling in face and hands  Zone 2: CAUTION Call your doctor's office for  any of the following:  . BP reading is greater than 140 (top number) or greater than 90 (bottom number)  . Stomach pain under your ribs in the middle or right side . Headaches or seeing spots . Feeling nauseated or throwing up . Swelling in face and hands  Zone 3: EMERGENCY  Seek immediate medical care if you have any of the following:  . BP reading is greater than160 (top number) or greater than 110 (bottom number) . Severe headaches not improving with Tylenol . Serious difficulty catching your breath . Any worsening symptoms from Zone 2   Third Trimester of Pregnancy The third trimester is from week 29 through week 42, months 7 through 9. The third trimester is a time when the fetus is growing rapidly. At the end of the ninth month, the fetus is about 20 inches in length and weighs 6-10 pounds.  BODY CHANGES Your body goes through many changes during pregnancy. The changes vary from woman to woman.   Your weight will continue to increase. You can expect to gain 25-35 pounds (11-16 kg) by the end of the pregnancy.  You may begin to get stretch marks on your hips, abdomen, and breasts.  You may urinate more often because the fetus is moving lower into your pelvis and pressing on your bladder.  You may develop or continue to have heartburn as a result of your pregnancy.  You may develop constipation because certain hormones are causing the muscles that push waste through your intestines to slow down.  You may develop hemorrhoids or swollen, bulging veins (varicose veins).  You may have pelvic pain because of the weight gain and pregnancy hormones relaxing your joints between the bones in your pelvis. Backaches may result from overexertion of the muscles supporting your posture.  You may have changes in your hair. These can include thickening of your hair, rapid growth, and changes in texture. Some women also have hair loss during or after pregnancy, or hair that feels dry or thin.  Your hair will most likely return to normal after your baby is born.  Your breasts will continue to grow and be tender. A yellow discharge may leak from your breasts called colostrum.  Your belly button may stick out.  You may feel short of breath because of your expanding uterus.  You may notice the fetus "dropping," or moving lower in your abdomen.  You may have a bloody mucus discharge. This usually occurs a few days to a week before labor begins.  Your cervix becomes thin and soft (effaced) near your due date. WHAT TO EXPECT AT YOUR PRENATAL EXAMS  You will have prenatal exams every 2 weeks until week 36. Then, you will have weekly prenatal exams. During a routine prenatal visit:  You  will be weighed to make sure you and the fetus are growing normally.  Your blood pressure is taken.  Your abdomen will be measured to track your baby's growth.  The fetal heartbeat will be listened to.  Any test results from the previous visit will be discussed.  You may have a cervical check near your due date to see if you have effaced. At around 36 weeks, your caregiver will check your cervix. At the same time, your caregiver will also perform a test on the secretions of the vaginal tissue. This test is to determine if a type of bacteria, Group B streptococcus, is present. Your caregiver will explain this further. Your caregiver may ask you:  What your birth plan is.  How you are feeling.  If you are feeling the baby move.  If you have had any abnormal symptoms, such as leaking fluid, bleeding, severe headaches, or abdominal cramping.  If you have any questions. Other tests or screenings that may be performed during your third trimester include:  Blood tests that check for low iron levels (anemia).  Fetal testing to check the health, activity level, and growth of the fetus. Testing is done if you have certain medical conditions or if there are problems during the pregnancy. FALSE  LABOR You may feel small, irregular contractions that eventually go away. These are called Braxton Hicks contractions, or false labor. Contractions may last for hours, days, or even weeks before true labor sets in. If contractions come at regular intervals, intensify, or become painful, it is best to be seen by your caregiver.  SIGNS OF LABOR   Menstrual-like cramps.  Contractions that are 5 minutes apart or less.  Contractions that start on the top of the uterus and spread down to the lower abdomen and back.  A sense of increased pelvic pressure or back pain.  A watery or bloody mucus discharge that comes from the vagina. If you have any of these signs before the 37th week of pregnancy, call your caregiver right away. You need to go to the hospital to get checked immediately. HOME CARE INSTRUCTIONS   Avoid all smoking, herbs, alcohol, and unprescribed drugs. These chemicals affect the formation and growth of the baby.  Follow your caregiver's instructions regarding medicine use. There are medicines that are either safe or unsafe to take during pregnancy.  Exercise only as directed by your caregiver. Experiencing uterine cramps is a good sign to stop exercising.  Continue to eat regular, healthy meals.  Wear a good support bra for breast tenderness.  Do not use hot tubs, steam rooms, or saunas.  Wear your seat belt at all times when driving.  Avoid raw meat, uncooked cheese, cat litter boxes, and soil used by cats. These carry germs that can cause birth defects in the baby.  Take your prenatal vitamins.  Try taking a stool softener (if your caregiver approves) if you develop constipation. Eat more high-fiber foods, such as fresh vegetables or fruit and whole grains. Drink plenty of fluids to keep your urine clear or pale yellow.  Take warm sitz baths to soothe any pain or discomfort caused by hemorrhoids. Use hemorrhoid cream if your caregiver approves.  If you develop varicose  veins, wear support hose. Elevate your feet for 15 minutes, 3-4 times a day. Limit salt in your diet.  Avoid heavy lifting, wear low heal shoes, and practice good posture.  Rest a lot with your legs elevated if you have leg cramps or low back  pain.  Visit your dentist if you have not gone during your pregnancy. Use a soft toothbrush to brush your teeth and be gentle when you floss.  A sexual relationship may be continued unless your caregiver directs you otherwise.  Do not travel far distances unless it is absolutely necessary and only with the approval of your caregiver.  Take prenatal classes to understand, practice, and ask questions about the labor and delivery.  Make a trial run to the hospital.  Pack your hospital bag.  Prepare the baby's nursery.  Continue to go to all your prenatal visits as directed by your caregiver. SEEK MEDICAL CARE IF:  You are unsure if you are in labor or if your water has broken.  You have dizziness.  You have mild pelvic cramps, pelvic pressure, or nagging pain in your abdominal area.  You have persistent nausea, vomiting, or diarrhea.  You have a bad smelling vaginal discharge.  You have pain with urination. SEEK IMMEDIATE MEDICAL CARE IF:   You have a fever.  You are leaking fluid from your vagina.  You have spotting or bleeding from your vagina.  You have severe abdominal cramping or pain.  You have rapid weight loss or gain.  You have shortness of breath with chest pain.  You notice sudden or extreme swelling of your face, hands, ankles, feet, or legs.  You have not felt your baby move in over an hour.  You have severe headaches that do not go away with medicine.  You have vision changes. Document Released: 11/17/2001 Document Revised: 11/28/2013 Document Reviewed: 01/24/2013 [employer] Patient Information 2015 [place], [place]. This information is not intended to replace advice given to you by your health care provider. Make  sure you discuss any questions you have with your health care provider.

## 2020-07-08 ENCOUNTER — Telehealth: Payer: Self-pay | Admitting: Obstetrics & Gynecology

## 2020-07-08 ENCOUNTER — Other Ambulatory Visit: Payer: Self-pay | Admitting: Advanced Practice Midwife

## 2020-07-08 DIAGNOSIS — O10919 Unspecified pre-existing hypertension complicating pregnancy, unspecified trimester: Secondary | ICD-10-CM

## 2020-07-08 NOTE — Telephone Encounter (Signed)
Pt's BP is 110/90. Pt has headache, dizziness and swelling. I asked pt to recheck her BP. Pt has been driving for an hour and wants to wait 15-20 minutes before checking it. Pt is not experiencing any new symptoms. I advised that was fine to just send reading thru MyChart. Pt voiced understanding. Melissa Livingston

## 2020-07-08 NOTE — Telephone Encounter (Signed)
Baby script called stating that Pt has a high bp reading of 110/90 and that patient is experiencing headaches, dizziness and swelling of Face, feet and hands. Please contact pt

## 2020-07-09 ENCOUNTER — Ambulatory Visit (INDEPENDENT_AMBULATORY_CARE_PROVIDER_SITE_OTHER): Payer: PRIVATE HEALTH INSURANCE | Admitting: Obstetrics & Gynecology

## 2020-07-09 ENCOUNTER — Encounter: Payer: Self-pay | Admitting: Obstetrics & Gynecology

## 2020-07-09 ENCOUNTER — Ambulatory Visit (INDEPENDENT_AMBULATORY_CARE_PROVIDER_SITE_OTHER): Payer: PRIVATE HEALTH INSURANCE

## 2020-07-09 VITALS — BP 129/85 | HR 104 | Wt 227.0 lb

## 2020-07-09 DIAGNOSIS — Z331 Pregnant state, incidental: Secondary | ICD-10-CM | POA: Diagnosis not present

## 2020-07-09 DIAGNOSIS — O10913 Unspecified pre-existing hypertension complicating pregnancy, third trimester: Secondary | ICD-10-CM

## 2020-07-09 DIAGNOSIS — O0990 Supervision of high risk pregnancy, unspecified, unspecified trimester: Secondary | ICD-10-CM

## 2020-07-09 DIAGNOSIS — I10 Essential (primary) hypertension: Secondary | ICD-10-CM

## 2020-07-09 DIAGNOSIS — Z1389 Encounter for screening for other disorder: Secondary | ICD-10-CM | POA: Diagnosis not present

## 2020-07-09 DIAGNOSIS — Z3A32 32 weeks gestation of pregnancy: Secondary | ICD-10-CM

## 2020-07-09 DIAGNOSIS — O10919 Unspecified pre-existing hypertension complicating pregnancy, unspecified trimester: Secondary | ICD-10-CM

## 2020-07-09 LAB — POCT URINALYSIS DIPSTICK OB
Glucose, UA: NEGATIVE
Ketones, UA: NEGATIVE
Nitrite, UA: NEGATIVE
POC,PROTEIN,UA: NEGATIVE

## 2020-07-09 NOTE — Progress Notes (Signed)
[place] 32+2 wks.cephalic,BPP 4/7,VVY 721 BPM,posterior placenta gr 0,AFI 13 cm.RI .55,.62,.52,.58=35%

## 2020-07-09 NOTE — Progress Notes (Signed)
HIGH-RISK PREGNANCY VISIT Patient name: Melissa Livingston MRN [id number]  Date of birth: [birth year] Chief Complaint:   High Risk Gestation ([place] today; headaches, back pain and pelvic pain )  History of Present Illness:   Melissa Livingston is a 23 y.o. G22P2002 female at [redacted]w[redacted]d with an Estimated Date of Delivery: 09/01/20 being seen today for ongoing management of a high-risk pregnancy complicated by chronic hypertension currently on labetalol 200 TID.  Today she reports no complaints.  Depression screen [facility] 2/9 05/28/2020 02/22/2020 06/05/2019 01/03/2018 11/03/2016  Decreased Interest 0 0 1 0 0  Down, Depressed, Hopeless 0 0 0 0 0  PHQ - 2 Score 0 0 1 0 0  Altered sleeping 2 1 - - -  Tired, decreased energy 1 2 - - -  Change in appetite 1 3 - - -  Feeling bad or failure about yourself  0 0 - - -  Trouble concentrating 0 0 - - -  Moving slowly or fidgety/restless 0 0 - - -  Suicidal thoughts 0 0 - - -  PHQ-9 Score 4 6 - - -  Difficult doing work/chores Not difficult at all - - - -  Some recent data might be hidden    Contractions: Irregular. Vag. Bleeding: None.  Movement: Present. denies leaking of fluid.  Review of Systems:   Pertinent items are noted in HPI Denies abnormal vaginal discharge w/ itching/odor/irritation, headaches, visual changes, shortness of breath, chest pain, abdominal pain, severe nausea/vomiting, or problems with urination or bowel movements unless otherwise stated above. Pertinent History Reviewed:  Reviewed past medical,surgical, social, obstetrical and family history.  Reviewed problem list, medications and allergies. Physical Assessment:   Vitals:   07/09/20 1223  BP: 129/85  Pulse: (!) 104  Weight: 227 lb (103 kg)  Body mass index is 40.21 kg/m.           Physical Examination:   General appearance: alert, well appearing, and in no distress  Mental status: alert, oriented to person, place, and time  Skin: warm & dry   Extremities: Edema: Trace    Cardiovascular:  normal heart rate noted  Respiratory: normal respiratory effort, no distress  Abdomen: gravid, soft, non-tender  Pelvic: Cervical exam deferred         Fetal Status:     Movement: Present    Fetal Surveillance Testing today: BPP 8/8 Dopplers 35%   Chaperone: n/a    Results for orders placed or performed in visit on 07/09/20 (from the past 24 hour(s))  POC Urinalysis Dipstick OB   Collection Time: 07/09/20 12:24 PM  Result Value Ref Range   Color, UA     Clarity, UA     Glucose, UA Negative Negative   Bilirubin, UA     Ketones, UA neg    Spec Grav, UA     Blood, UA trace    pH, UA     POC,PROTEIN,UA Negative Negative, Trace, Small (1+), Moderate (2+), Large (3+), 4+   Urobilinogen, UA     Nitrite, UA neg    Leukocytes, UA Large (3+) (A) Negative   Appearance     Odor      Assessment & Plan:  1) High-risk pregnancy G3P2002 at [redacted]w[redacted]d with an Estimated Date of Delivery: 09/01/20   2) CHTN, stable, 130s/80s at home  3) Prev C/S x 2, no tubal, for repeat  Meds: No orders of the defined types were placed in this encounter.   Labs/procedures today:   Treatment  Plan:  Twice weekly surveillance repeat section at 39 weeks or as clinically indicated  Reviewed: Preterm labor symptoms and general obstetric precautions including but not limited to vaginal bleeding, contractions, leaking of fluid and fetal movement were reviewed in detail with the patient.  All questions were answered. Has home bp cuff. Rx faxed to . Check bp weekly, let us know if >140/90.   Follow-up: Return in about 3 days (around 07/12/2020) for NST, Nurse only.  Orders Placed This Encounter  Procedures  . POC Urinalysis Dipstick OB   Florian Buff 07/09/2020 12:36 PM

## 2020-07-12 ENCOUNTER — Ambulatory Visit (INDEPENDENT_AMBULATORY_CARE_PROVIDER_SITE_OTHER): Payer: PRIVATE HEALTH INSURANCE | Admitting: *Deleted

## 2020-07-12 DIAGNOSIS — Z1389 Encounter for screening for other disorder: Secondary | ICD-10-CM | POA: Diagnosis not present

## 2020-07-12 DIAGNOSIS — Z3A32 32 weeks gestation of pregnancy: Secondary | ICD-10-CM

## 2020-07-12 DIAGNOSIS — O0990 Supervision of high risk pregnancy, unspecified, unspecified trimester: Secondary | ICD-10-CM

## 2020-07-12 DIAGNOSIS — O10913 Unspecified pre-existing hypertension complicating pregnancy, third trimester: Secondary | ICD-10-CM | POA: Diagnosis not present

## 2020-07-12 DIAGNOSIS — Z331 Pregnant state, incidental: Secondary | ICD-10-CM

## 2020-07-12 LAB — POCT URINALYSIS DIPSTICK OB
Blood, UA: NEGATIVE
Glucose, UA: NEGATIVE
Ketones, UA: NEGATIVE
Leukocytes, UA: NEGATIVE
Nitrite, UA: NEGATIVE
POC,PROTEIN,UA: NEGATIVE

## 2020-07-12 NOTE — Progress Notes (Signed)
   NURSE VISIT- NST  SUBJECTIVE:  Melissa Livingston is a 24 y.o. G17P2002 female at [redacted]w[redacted]d, here for a NST for pregnancy complicated by [facility].  She reports active fetal movement, contractions: none, vaginal bleeding: none, membranes: intact.   OBJECTIVE:  LMP 11/26/2019   Appears well, no apparent distress  Results for orders placed or performed in visit on 07/12/20 (from the past 24 hour(s))  POC Urinalysis Dipstick OB   Collection Time: 07/12/20 11:53 AM  Result Value Ref Range   Color, UA     Clarity, UA     Glucose, UA Negative Negative   Bilirubin, UA     Ketones, UA neg    Spec Grav, UA     Blood, UA neg    pH, UA     POC,PROTEIN,UA Negative Negative, Trace, Small (1+), Moderate (2+), Large (3+), 4+   Urobilinogen, UA     Nitrite, UA neg    Leukocytes, UA Negative Negative   Appearance     Odor      NST: FHR baseline 140 bpm, Variability: moderate, Accelerations:present, Decelerations:  Absent= Cat 1/Reactive Toco: none   ASSESSMENT: G3P2002 at [redacted]w[redacted]d with CHTN NST reactive  PLAN: EFM strip reviewed by Knute Neu, CNM, [facility]   Recommendations: keep next appointment as scheduled    Janece Canterbury  07/12/2020 12:20 PM

## 2020-07-15 ENCOUNTER — Other Ambulatory Visit: Payer: Self-pay | Admitting: Obstetrics & Gynecology

## 2020-07-15 DIAGNOSIS — O10919 Unspecified pre-existing hypertension complicating pregnancy, unspecified trimester: Secondary | ICD-10-CM

## 2020-07-16 ENCOUNTER — Ambulatory Visit (INDEPENDENT_AMBULATORY_CARE_PROVIDER_SITE_OTHER): Payer: PRIVATE HEALTH INSURANCE | Admitting: Obstetrics and Gynecology

## 2020-07-16 ENCOUNTER — Encounter: Payer: Self-pay | Admitting: Obstetrics and Gynecology

## 2020-07-16 ENCOUNTER — Ambulatory Visit (INDEPENDENT_AMBULATORY_CARE_PROVIDER_SITE_OTHER): Payer: PRIVATE HEALTH INSURANCE

## 2020-07-16 VITALS — BP 123/77 | HR 107 | Wt 229.5 lb

## 2020-07-16 DIAGNOSIS — Z1389 Encounter for screening for other disorder: Secondary | ICD-10-CM | POA: Diagnosis not present

## 2020-07-16 DIAGNOSIS — O10919 Unspecified pre-existing hypertension complicating pregnancy, unspecified trimester: Secondary | ICD-10-CM

## 2020-07-16 DIAGNOSIS — O0990 Supervision of high risk pregnancy, unspecified, unspecified trimester: Secondary | ICD-10-CM

## 2020-07-16 DIAGNOSIS — O0993 Supervision of high risk pregnancy, unspecified, third trimester: Secondary | ICD-10-CM

## 2020-07-16 DIAGNOSIS — O10913 Unspecified pre-existing hypertension complicating pregnancy, third trimester: Secondary | ICD-10-CM | POA: Diagnosis not present

## 2020-07-16 DIAGNOSIS — O99019 Anemia complicating pregnancy, unspecified trimester: Secondary | ICD-10-CM | POA: Insufficient documentation

## 2020-07-16 DIAGNOSIS — Z331 Pregnant state, incidental: Secondary | ICD-10-CM

## 2020-07-16 DIAGNOSIS — Z3A33 33 weeks gestation of pregnancy: Secondary | ICD-10-CM

## 2020-07-16 LAB — POCT URINALYSIS DIPSTICK OB
Blood, UA: NEGATIVE
Glucose, UA: NEGATIVE
Ketones, UA: NEGATIVE
Leukocytes, UA: NEGATIVE
Nitrite, UA: NEGATIVE
POC,PROTEIN,UA: NEGATIVE

## 2020-07-16 NOTE — Patient Instructions (Signed)
   [place] at [facility] [address] [place],    [address]  Get Driving Directions  Main: [phone]

## 2020-07-16 NOTE — Progress Notes (Signed)
PATIENT ID: Melissa Livingston, female     DOB: 03-Mar-1996, 24 y.o.     MRN: [id number]     [facility] PREGNANCY VISIT PATIENT NAME: Melissa Livingston MRN [id number]  DOB: [birth year] Chief Complaint:   High Risk Gestation ([place] today; back pain; pain in right side; + contractions)   History of Present Illness:   Melissa Livingston is a 24 y.o. G32P2002 female at [redacted]w[redacted]d with an Estimated Date of Delivery: 09/01/20 being seen today for ongoing management of a high-risk pregnancy complicated by chronic hypertension currently on labetalol 200 TID.  Today she reports backache and occasional contractions.   She notes that she is having sharp, stabbing pain on the right side of her lower abdomin near her pelvis. She has tried using a tummy wrap without any help.   She continues to take oral iron as directed.   Depression screen [facility] 2/9 05/28/2020 02/22/2020 06/05/2019 01/03/2018 11/03/2016  Decreased Interest 0 0 1 0 0  Down, Depressed, Hopeless 0 0 0 0 0  PHQ - 2 Score 0 0 1 0 0  Altered sleeping 2 1 - - -  Tired, decreased energy 1 2 - - -  Change in appetite 1 3 - - -  Feeling bad or failure about yourself  0 0 - - -  Trouble concentrating 0 0 - - -  Moving slowly or fidgety/restless 0 0 - - -  Suicidal thoughts 0 0 - - -  PHQ-9 Score 4 6 - - -  Difficult doing work/chores Not difficult at all - - - -  Some recent data might be hidden    Contractions: Irregular. Vag. Bleeding: None.  Movement: Present. denies leaking of fluid.   Review of Systems:   Pertinent items are noted in HPI Denies abnormal vaginal discharge w/ itching/odor/irritation, headaches, visual changes, shortness of breath, chest pain, abdominal pain, severe nausea/vomiting, or problems with urination or bowel movements unless otherwise stated above.  07/16/2020 [place] 27+7 wks,cephalic,fhr 824 bpm,BPP 2/3,[id number] placenta gr 1,RI .61,.59,.52=41%,AFI 13 cm  Pertinent History Reviewed:  Reviewed past medical,surgical, social, obstetrical and family  history.  Reviewed problem list, medications and allergies.  Physical Assessment:   Vitals:   07/16/20 1620  BP: 123/77  Pulse: (!) 107  Weight: 229 lb 8 oz (104.1 kg)  Body mass index is 40.65 kg/m.           Physical Examination:   General appearance: alert, well appearing, and in no distress, oriented to person, place, and time and normal appearing weight  Mental status: alert, oriented to person, place, and time, normal mood, behavior, speech, dress, motor activity, and thought processes, affect appropriate to mood  Skin: warm & dry   Extremities: Edema: Trace    Cardiovascular: normal heart rate noted  Respiratory: normal respiratory effort, no distress  Abdomen: gravid, soft, non-tender   Baby: 145 bpm   34 cm  Pelvic: Cervical exam deferred         Fetal Status:     Movement: Present    Fetal Surveillance Testing today:    FOLLOW UP SONOGRAM   [name] is in the office for a follow up sonogram for BPP and cord dopplers.  She is a 24 y.o. year old G54P2002 with Estimated Date of Delivery: 09/01/20 by LMP now at  [redacted]w[redacted]d weeks gestation. Thus far the pregnancy has been complicated by [facility].  GESTATION: SINGLETON  PRESENTATION: cephalic  FETAL ACTIVITY:  Heart rate         148          The fetus is active.  AMNIOTIC FLUID: The amniotic fluid volume is  normal, 13 cm.  PLACENTA LOCALIZATION:  posterior GRADE 1  CERVIX: Limited view    GESTATIONAL AGE AND  BIOMETRICS:  Gestational criteria: Estimated Date of Delivery: 09/01/20 by LMP now at 107w2d  Previous Scans:6    BIOPHYSICAL PROFILE:                                                                                                      COMMENTS GROSS BODY MOVEMENT                 2   TONE                2   RESPIRATIONS                2   AMNIOTIC FLUID                2                                                            SCORE:  8/8 (Note: NST was not  performed as part of this antepartum testing)  DOPPLER FLOW STUDIES: UMBILICAL ARTERY RI RATIOS:   .61,.59,.52=41%   ANATOMICAL SURVEY                                                                            COMMENTS CEREBRAL VENTRICLES yes normal   CHOROID PLEXUS yes normal   CEREBELLUM yes normal   CISTERNA MAGNA yes normal                       FACIAL PROFILE yes normal   4 CHAMBERED HEART yes normal   OUTFLOW TRACTS yes normal   DIAPHRAGM yes normal   STOMACH yes normal   RENAL REGION yes normal   BLADDER yes normal        3 VESSEL CORD yes normal                  GENITALIA yes normal female        SUSPECTED ABNORMALITIES:  no  QUALITY OF SCAN: satisfactory  TECHNICIAN COMMENTS:  [place] 60+4 wks,cephalic,fhr 540 bpm,BPP 9/8,[id number] placenta gr 1,RI .61,.59,.52=41%,AFI 13 cm        A copy of this report including all images has been saved and backed up to a second source for retrieval if needed. All measures and details  of the anatomical scan, placentation, fluid volume and pelvic anatomy are contained in that report.  Silver Huguenin 07/16/2020 4:24 PM        Chaperone: Jacqualyn Posey    Results for orders placed or performed in visit on 07/16/20 (from the past 24 hour(s))  POC Urinalysis Dipstick OB   Collection Time: 07/16/20  4:16 PM  Result Value Ref Range   Color, UA     Clarity, UA     Glucose, UA Negative Negative   Bilirubin, UA     Ketones, UA neg    Spec Grav, UA     Blood, UA neg    pH, UA     POC,PROTEIN,UA Negative Negative, Trace, Small (1+), Moderate (2+), Large (3+), 4+   Urobilinogen, UA     Nitrite, UA neg    Leukocytes, UA Negative Negative   Appearance     Odor       Assessment & Plan:  1) High-risk pregnancy G3P2002 at [redacted]w[redacted]d with an Estimated Date of Delivery: 09/01/20   2) chronic hypertension, stable on labetalol 200 twice daily  3)   Meds: No orders  of the defined types were placed in this encounter.   Labs/procedures today: BPP  Treatment Plan: Per hypertension protocol will get weekly BPP's alternating with NSTs on Friday  Reviewed: Preterm labor symptoms and general obstetric precautions including but not limited to vaginal bleeding, contractions, leaking of fluid and fetal movement were reviewed in detail with the patient.  All questions were answered. Check bp weekly, let us know if >140/90.   Follow-up: No follow-ups on file.   By signing my name below, I, [employer], attest that this documentation has been prepared under the direction and in the presence of Jonnie Kind, MD. Electronically Signed: Payne. 07/16/20. 4:32 PM. . I personally performed the services described in this documentation, which was SCRIBED in my presence. The recorded information has been reviewed and considered accurate. It has been edited as necessary during review. Jonnie Kind, MD

## 2020-07-16 NOTE — Progress Notes (Signed)
[place] 26+9 wks,cephalic,fhr 485 bpm,BPP 4/6,[id number] placenta gr 1,RI .61,.59,.52=41%,AFI 13 cm

## 2020-07-19 ENCOUNTER — Ambulatory Visit (INDEPENDENT_AMBULATORY_CARE_PROVIDER_SITE_OTHER): Payer: PRIVATE HEALTH INSURANCE | Admitting: *Deleted

## 2020-07-19 VITALS — BP 122/76 | HR 132 | Wt 227.0 lb

## 2020-07-19 DIAGNOSIS — Z1389 Encounter for screening for other disorder: Secondary | ICD-10-CM

## 2020-07-19 DIAGNOSIS — Z3A33 33 weeks gestation of pregnancy: Secondary | ICD-10-CM

## 2020-07-19 DIAGNOSIS — Z331 Pregnant state, incidental: Secondary | ICD-10-CM | POA: Diagnosis not present

## 2020-07-19 DIAGNOSIS — O0990 Supervision of high risk pregnancy, unspecified, unspecified trimester: Secondary | ICD-10-CM

## 2020-07-19 DIAGNOSIS — O10919 Unspecified pre-existing hypertension complicating pregnancy, unspecified trimester: Secondary | ICD-10-CM

## 2020-07-19 LAB — POCT URINALYSIS DIPSTICK OB
Blood, UA: NEGATIVE
Glucose, UA: NEGATIVE
Ketones, UA: NEGATIVE
Leukocytes, UA: NEGATIVE
Nitrite, UA: NEGATIVE
POC,PROTEIN,UA: NEGATIVE

## 2020-07-19 NOTE — Progress Notes (Addendum)
   NURSE VISIT- NST  SUBJECTIVE:  Melissa Livingston is a 24 y.o. G71P2002 female at [redacted]w[redacted]d, here for a NST for pregnancy complicated by [facility].  She reports active fetal movement, contractions: none, vaginal bleeding: none, membranes: intact.   OBJECTIVE:  BP 122/76   Pulse (!) 132   Wt 227 lb (103 kg)   LMP 11/26/2019   BMI 40.21 kg/m   Appears well, no apparent distress  Results for orders placed or performed in visit on 07/19/20 (from the past 24 hour(s))  POC Urinalysis Dipstick OB   Collection Time: 07/19/20 11:45 AM  Result Value Ref Range   Color, UA     Clarity, UA     Glucose, UA Negative Negative   Bilirubin, UA     Ketones, UA neg    Spec Grav, UA     Blood, UA neg    pH, UA     POC,PROTEIN,UA Negative Negative, Trace, Small (1+), Moderate (2+), Large (3+), 4+   Urobilinogen, UA     Nitrite, UA neg    Leukocytes, UA Negative Negative   Appearance     Odor      NST: FHR baseline 145 bpm, Variability: moderate, Accelerations:present, Decelerations:  Absent= Cat 1/Reactive Toco: none   ASSESSMENT: G3P2002 at [redacted]w[redacted]d with CHTN NST reactive  PLAN: EFM strip reviewed by Dr. Elonda Husky   Recommendations: keep next appointment as scheduled    Alice Rieger  07/19/2020 12:32 PM  Attestation of Attending Supervision of Nursing Visit Encounter: Evaluation and management procedures were performed by the nursing staff under my supervision and collaboration.  I have reviewed the nurse's note and chart, and I agree with the management and plan.  Jacelyn Grip MD Attending Physician for the Center for [facility] 07/19/2020 12:58 PM

## 2020-07-21 ENCOUNTER — Ambulatory Visit
Admission: EM | Admit: 2020-07-21 | Discharge: 2020-07-21 | Disposition: A | Discharge status: Home / Self Care | Payer: PRIVATE HEALTH INSURANCE | Attending: Emergency Medicine | Admitting: Emergency Medicine

## 2020-07-21 ENCOUNTER — Other Ambulatory Visit: Payer: Self-pay

## 2020-07-21 ENCOUNTER — Encounter: Payer: Self-pay | Admitting: Emergency Medicine

## 2020-07-21 DIAGNOSIS — Z1152 Encounter for screening for COVID-19: Secondary | ICD-10-CM | POA: Insufficient documentation

## 2020-07-21 DIAGNOSIS — R05 Cough: Secondary | ICD-10-CM | POA: Diagnosis not present

## 2020-07-21 DIAGNOSIS — J069 Acute upper respiratory infection, unspecified: Secondary | ICD-10-CM | POA: Diagnosis not present

## 2020-07-21 DIAGNOSIS — R059 Cough, unspecified: Secondary | ICD-10-CM

## 2020-07-21 LAB — POCT RAPID STREP A (OFFICE): Rapid Strep A Screen: NEGATIVE

## 2020-07-21 MED ORDER — FLUTICASONE PROPIONATE 50 MCG/ACT NA SUSP
1.0000 | Freq: Every day | NASAL | 0 refills | Status: DC
Start: 2020-07-21 — End: 2020-08-20

## 2020-07-21 MED ORDER — CEPACOL REGULAR STRENGTH 3 MG MT LOZG: 1.0000 | LOZENGE | OROMUCOSAL | 12 refills | Status: DC | PRN

## 2020-07-21 NOTE — ED Provider Notes (Addendum)
[facility]   [id number] 07/21/20 Arrival Time: 0840   CC: COVID symptoms  SUBJECTIVE: History from: patient.  Melissa Livingston is a 24 y.o. female who is expecting presents with abrupt cough, runny nose, ear pain, sore throat and body for the past 3 days.  Denies sick exposure to COVID, flu or strep.  Denies recent travel.  Has tried OTC medication without relief.  Denies relieving factors.   Denies previous symptoms in the past.   Denies  fatigue, sinus pain, rhinorrhea, , SOB, wheezing, chest pain, nausea, changes in bowel or bladder habits.      ROS: As per HPI.  All other pertinent ROS negative.     Past Medical History:  Diagnosis Date  . Anemia   . Anxiety   . Asthma    as child  . Endometritis 01/11/2015  . GERD (gastroesophageal reflux disease)   . History of ovarian cyst 01/28/2015  . IBS (irritable bowel syndrome)   . Nausea and vomiting 01/11/2015  . Ovarian cyst 01/14/2015  . Pelvic pain in female 01/11/2015  . Pregnancy induced hypertension    with pregnancy; no meds now.  . Pregnant 02/05/2016   Past Surgical History:  Procedure Laterality Date  . CESAREAN SECTION N/A 09/29/2014   Procedure: CESAREAN SECTION;  Surgeon: Jonnie Kind, MD;  Location: [facility];  Service: Obstetrics;  Laterality: N/A;  . CESAREAN SECTION N/A 09/25/2016   Procedure: CESAREAN SECTION;  Surgeon: Jonnie Kind, MD;  Location: [facility];  Service: Obstetrics;  Laterality: N/A;  . CHOLECYSTECTOMY N/A 03/07/2018   Procedure: LAPAROSCOPIC CHOLECYSTECTOMY;  Surgeon: Aviva Signs, MD;  Location: [facility];  Service: General;  Laterality: N/A;  . COLONOSCOPY WITH PROPOFOL N/A 08/02/2017   Dr. Gala Romney: moderate internal hemorhoids, distal 5 cm of TI also appeared normal  . ESOPHAGOGASTRODUODENOSCOPY (EGD) WITH ESOPHAGEAL DILATION N/A 07/19/2013   Dr. Rourk:normal appearing esophagus s/p dilation, normal D1 and D2, unremarkable path   . ESOPHAGOGASTRODUODENOSCOPY (EGD) WITH PROPOFOL N/A  08/02/2017   Normal esophagus, small hiatal hernia, normal duodenum  . none    . WISDOM TOOTH EXTRACTION     Allergies  Allergen Reactions  . Adhesive [Tape] Other (See Comments)    Blisters   . Lorabid [Loracarbef] Hives, Swelling and Other (See Comments)    Mouth swelling Can take amoxil and augmentin   . Latex Itching and Rash  . Orange Fruit [Citrus] Rash   No current facility-administered medications on file prior to encounter.   Current Outpatient Medications on File Prior to Encounter  Medication Sig Dispense Refill  . acetaminophen (TYLENOL) 325 MG tablet Take 650 mg by mouth every 6 (six) hours as needed for mild pain, moderate pain or headache.     Marland Kitchen aspirin EC 81 MG tablet Take 2 tablets (162 mg total) by mouth daily. 60 tablet 6  . butalbital-acetaminophen-caffeine (FIORICET) 50-325-40 MG tablet Take 1 tablet by mouth every 4 (four) hours as needed for headache or migraine. 20 tablet 0  . cyclobenzaprine (FLEXERIL) 10 MG tablet Take 1 tablet (10 mg total) by mouth every 8 (eight) hours as needed for muscle spasms. 30 tablet 0  . ferrous sulfate 325 (65 FE) MG tablet Take 1 tablet (325 mg total) by mouth 2 (two) times daily with a meal. 60 tablet 3  . labetalol (NORMODYNE) 200 MG tablet Take 1 tablet (200 mg total) by mouth 2 (two) times daily. (Patient taking differently: Take 200 mg by mouth 3 (three) times  daily. ) 60 tablet 3  . omeprazole (PRILOSEC) 20 MG capsule Take 1 capsule (20 mg total) by mouth daily. (Patient taking differently: Take 20 mg by mouth as needed. ) 30 capsule 6  . ondansetron (ZOFRAN ODT) 4 MG disintegrating tablet Take 1 tablet (4 mg total) by mouth every 8 (eight) hours as needed for nausea or vomiting. 30 tablet 1  . oxyCODONE-acetaminophen (PERCOCET/ROXICET) 5-325 MG tablet Take 1 tablet by mouth every 4 (four) hours as needed for severe pain. 20 tablet 0  . Prenatal Vit-Fe Fumarate-FA (MULTIVITAMIN-PRENATAL) 27-0.8 MG TABS tablet Take 1 tablet by  mouth daily at 12 noon.    . promethazine (PHENERGAN) 25 MG tablet Take 1 tablet (25 mg total) by mouth every 6 (six) hours as needed for nausea or vomiting. 30 tablet 1   Social History   Socioeconomic History  . Marital status: Single    Spouse name: Not on file  . Number of children: 2  . Years of education: Not on file  . Highest education level: Some college, no degree  Occupational History  . Occupation: out due to knee  Tobacco Use  . Smoking status: Never Smoker  . Smokeless tobacco: Never Used  Vaping Use  . Vaping Use: Never used  Substance and Sexual Activity  . Alcohol use: No    Alcohol/week: 0.0 standard drinks  . Drug use: No  . Sexual activity: Yes    Birth control/protection: None  Other Topics Concern  . Not on file  Social History Narrative  . Not on file   Social Determinants of Health   Financial Resource Strain: Low Risk   . Difficulty of Paying Living Expenses: Not hard at all  Food Insecurity: No Food Insecurity  . Worried About Charity fundraiser in the Last Year: Never true  . Ran Out of Food in the Last Year: Never true  Transportation Needs: No Transportation Needs  . Lack of Transportation (Medical): No  . Lack of Transportation (Non-Medical): No  Physical Activity: Insufficiently Active  . Days of Exercise per Week: 3 days  . Minutes of Exercise per Session: 30 min  Stress: No Stress Concern Present  . Feeling of Stress : Only a little  Social Connections: Moderately Integrated  . Frequency of Communication with Friends and Family: More than three times a week  . Frequency of Social Gatherings with Friends and Family: Twice a week  . Attends Religious Services: 1 to 4 times per year  . Active Member of Clubs or Organizations: No  . Attends Archivist Meetings: Never  . Marital Status: Living with partner  Intimate Partner Violence: Not At Risk  . Fear of Current or Ex-Partner: No  . Emotionally Abused: No  . Physically  Abused: No  . Sexually Abused: No   Family History  Problem Relation Age of Onset  . Multiple sclerosis Mother   . Heart disease Maternal Grandfather   . Heart disease Paternal Uncle   . Diabetes Paternal Uncle   . Hypertension Paternal Uncle   . Cancer Other        father's aunt had breast cancer  . Hypertension Father   . Colon cancer Other        maternal great uncle, age greater than 47  . Crohn's disease Other        couple of family members on father's side of family  . Hypertension Paternal Aunt     OBJECTIVE:  Vitals:   07/21/20  0902 07/21/20 0903  BP: 125/78   Pulse: (!) 118   Resp: 19   Temp: 99.2 F (37.3 C)   TempSrc: Oral   SpO2: 98%   Weight:  228 lb (103.4 kg)  Height:  5\' 3"  (1.6 m)     General appearance: alert; appears fatigued, but nontoxic; speaking in full sentences and tolerating own secretions HEENT: NCAT; Ears: EACs clear, TMs pearly gray; Eyes: PERRL.  EOM grossly intact. Sinuses: nontender; Nose: nares patent without rhinorrhea, Throat: oropharynx clear, tonsils non erythematous or enlarged, uvula midline  Neck: supple without LAD Lungs: unlabored respirations, symmetrical air entry; cough: moderate; no respiratory distress; CTAB Heart: regular rate and rhythm.  Radial pulses 2+ symmetrical bilaterally Skin: warm and dry Psychological: alert and cooperative; normal mood and affect  LABS:  Results for orders placed or performed during the hospital encounter of 07/21/20 (from the past 24 hour(s))  POCT rapid strep A     Status: None   Collection Time: 07/21/20  9:15 AM  Result Value Ref Range   Rapid Strep A Screen Negative Negative     ASSESSMENT & PLAN:  1. Cough   2. URI with cough and congestion   3. Encounter for screening for COVID-19     Meds ordered this encounter  Medications  . fluticasone (FLONASE) 50 MCG/ACT nasal spray    Sig: Place 1 spray into both nostrils daily for 14 days.    Dispense:  16 g    Refill:  0  .  menthol-cetylpyridinium (CEPACOL REGULAR STRENGTH) 3 MG lozenge    Sig: Take 1 lozenge (3 mg total) by mouth as needed for sore throat.    Dispense:  100 tablet    Refill:  12    Discharge Instructions.Marland Kitchen    COVID testing ordered.  It will take between 2-7 days for test results.  Someone will contact you regarding abnormal results.    In the meantime: You should remain isolated in your home for 10 days from symptom onset AND greater than 72 hours after symptoms resolution (absence of fever without the use of fever-reducing medication and improvement in respiratory symptoms), whichever is longer Get plenty of rest and push fluids Use medications daily for symptom relief Use OTC medications like ibuprofen or tylenol as needed fever or pain Call or go to the ED if you have any new or worsening symptoms such as fever, worsening cough, shortness of breath, chest tightness, chest pain, turning blue, changes in mental status, etc...Marland KitchenMarland KitchenMarland Kitchen   Reviewed expectations re: course of current medical issues. Questions answered. Outlined signs and symptoms indicating need for more acute intervention. Patient verbalized understanding. After Visit Summary given.      Note: This document was prepared using Dragon voice recognition software and may include unintentional dictation errors.    Emerson Monte, FNP 07/21/20 0935    Emerson Monte, FNP 07/21/20 [phone]

## 2020-07-21 NOTE — Discharge Instructions (Addendum)

## 2020-07-21 NOTE — ED Triage Notes (Addendum)
Cough, runny nose., low fever , ear pain, sore throat and body aches x 3 days

## 2020-07-22 ENCOUNTER — Other Ambulatory Visit: Payer: Self-pay | Admitting: Obstetrics and Gynecology

## 2020-07-22 DIAGNOSIS — O10919 Unspecified pre-existing hypertension complicating pregnancy, unspecified trimester: Secondary | ICD-10-CM

## 2020-07-22 LAB — SARS-COV-2, NAA 2 DAY TAT

## 2020-07-22 LAB — NOVEL CORONAVIRUS, NAA: SARS-CoV-2, NAA: DETECTED — AB

## 2020-07-23 ENCOUNTER — Encounter: Payer: PRIVATE HEALTH INSURANCE | Admitting: Women's Health

## 2020-07-23 ENCOUNTER — Telehealth (INDEPENDENT_AMBULATORY_CARE_PROVIDER_SITE_OTHER): Payer: PRIVATE HEALTH INSURANCE | Admitting: Obstetrics and Gynecology

## 2020-07-23 ENCOUNTER — Encounter: Payer: Self-pay | Admitting: Obstetrics and Gynecology

## 2020-07-23 ENCOUNTER — Other Ambulatory Visit: Payer: PRIVATE HEALTH INSURANCE | Admitting: Women's Health

## 2020-07-23 ENCOUNTER — Other Ambulatory Visit: Payer: PRIVATE HEALTH INSURANCE

## 2020-07-23 VITALS — BP 90/63 | HR 135 | Wt 228.5 lb

## 2020-07-23 DIAGNOSIS — O98519 Other viral diseases complicating pregnancy, unspecified trimester: Secondary | ICD-10-CM | POA: Diagnosis not present

## 2020-07-23 DIAGNOSIS — I10 Essential (primary) hypertension: Secondary | ICD-10-CM

## 2020-07-23 DIAGNOSIS — O10919 Unspecified pre-existing hypertension complicating pregnancy, unspecified trimester: Secondary | ICD-10-CM | POA: Diagnosis not present

## 2020-07-23 DIAGNOSIS — O0990 Supervision of high risk pregnancy, unspecified, unspecified trimester: Secondary | ICD-10-CM | POA: Diagnosis not present

## 2020-07-23 DIAGNOSIS — U071 COVID-19: Secondary | ICD-10-CM

## 2020-07-23 NOTE — Progress Notes (Signed)
TELEHEALTH GYNECOLOGY VISIT ENCOUNTER NOTE  I connected with [place] on 07/23/20 at 11:30 AM EDT by telephone at home and verified that I am speaking with the correct person using two identifiers.   I discussed the limitations, risks, security and privacy concerns of performing an evaluation and management service by telephone and the availability of in person appointments. I also discussed with the patient that there may be a patient responsible charge related to this service. The patient expressed understanding and agreed to proceed.   History:  Melissa Livingston is a 24 y.o. G15P2002 female being evaluated today for high risk pregnancy complicated by chronic hypertension, currently on labetalol 200 mg TID. She denies any abnormal vaginal discharge, bleeding, pelvic pain or other concerns.   Pt has COVID, HAS fever to 99.2, mild muscle aches. , nausea , no vomiting.  Now day 5. Children have it too.      Past Medical History:  Diagnosis Date  . Anemia   . Anxiety   . Asthma    as child  . Endometritis 01/11/2015  . GERD (gastroesophageal reflux disease)   . History of ovarian cyst 01/28/2015  . IBS (irritable bowel syndrome)   . Nausea and vomiting 01/11/2015  . Ovarian cyst 01/14/2015  . Pelvic pain in female 01/11/2015  . Pregnancy induced hypertension    with pregnancy; no meds now.  . Pregnant 02/05/2016   Past Surgical History:  Procedure Laterality Date  . CESAREAN SECTION N/A 09/29/2014   Procedure: CESAREAN SECTION;  Surgeon: Jonnie Kind, MD;  Location: [facility];  Service: Obstetrics;  Laterality: N/A;  . CESAREAN SECTION N/A 09/25/2016   Procedure: CESAREAN SECTION;  Surgeon: Jonnie Kind, MD;  Location: [facility];  Service: Obstetrics;  Laterality: N/A;  . CHOLECYSTECTOMY N/A 03/07/2018   Procedure: LAPAROSCOPIC CHOLECYSTECTOMY;  Surgeon: Aviva Signs, MD;  Location: [facility];  Service: General;  Laterality: N/A;  . COLONOSCOPY WITH PROPOFOL N/A 08/02/2017   Dr.  Gala Romney: moderate internal hemorhoids, distal 5 cm of TI also appeared normal  . ESOPHAGOGASTRODUODENOSCOPY (EGD) WITH ESOPHAGEAL DILATION N/A 07/19/2013   Dr. Rourk:normal appearing esophagus s/p dilation, normal D1 and D2, unremarkable path   . ESOPHAGOGASTRODUODENOSCOPY (EGD) WITH PROPOFOL N/A 08/02/2017   Normal esophagus, small hiatal hernia, normal duodenum  . none    . WISDOM TOOTH EXTRACTION     The following portions of the patient's history were reviewed and updated as appropriate: allergies, current medications, past family history, past medical history, past social history, past surgical history and problem list.    Review of Systems:  Pertinent items noted in HPI and remainder of comprehensive ROS otherwise negative.  Physical Exam:   General:  Alert, oriented and cooperative.   Mental Status: Normal mood and affect perceived. Normal judgment and thought content.  Physical exam deferred due to nature of the encounter BP 90/63   Pulse (!) 135   Wt 228 lb 8 oz (103.6 kg)   LMP 11/26/2019   BMI 40.48 kg/m   Pulse is up. Labs and Imaging Results for orders placed or performed during the hospital encounter of 07/21/20 (from the past 336 hour(s))  Novel Coronavirus, NAA ([facility])   Collection Time: 07/21/20  9:15 AM   Specimen: Nasopharyngeal(NP) swabs in vial transport medium   Nasopharynge  Patient  Result Value Ref Range   SARS-CoV-2, NAA Detected (A) Not Detected  SARS-COV-2, NAA 2 DAY TAT   Collection Time: 07/21/20  9:15 AM  Nasopharynge  Patient  Result Value Ref Range   SARS-CoV-2, NAA 2 DAY TAT Performed   POCT rapid strep A   Collection Time: 07/21/20  9:15 AM  Result Value Ref Range   Rapid Strep A Screen Negative Negative  Culture, group A strep   Collection Time: 07/21/20  9:21 AM   Specimen: Throat  Result Value Ref Range   Specimen Description      THROAT Performed at [facility], [address] [place], [place] [address]    Special Requests       Normal Performed at [facility], [address], [place], [place] [address]    Culture      CULTURE REINCUBATED FOR BETTER GROWTH Performed at [facility], [place] [address], [place], [place] [address]    Report Status PENDING   Results for orders placed or performed in visit on 07/19/20 (from the past 336 hour(s))  POC Urinalysis Dipstick OB   Collection Time: 07/19/20 11:45 AM  Result Value Ref Range   Color, UA     Clarity, UA     Glucose, UA Negative Negative   Bilirubin, UA     Ketones, UA neg    Spec Grav, UA     Blood, UA neg    pH, UA     POC,PROTEIN,UA Negative Negative, Trace, Small (1+), Moderate (2+), Large (3+), 4+   Urobilinogen, UA     Nitrite, UA neg    Leukocytes, UA Negative Negative   Appearance     Odor    Results for orders placed or performed in visit on 07/16/20 (from the past 336 hour(s))  POC Urinalysis Dipstick OB   Collection Time: 07/16/20  4:16 PM  Result Value Ref Range   Color, UA     Clarity, UA     Glucose, UA Negative Negative   Bilirubin, UA     Ketones, UA neg    Spec Grav, UA     Blood, UA neg    pH, UA     POC,PROTEIN,UA Negative Negative, Trace, Small (1+), Moderate (2+), Large (3+), 4+   Urobilinogen, UA     Nitrite, UA neg    Leukocytes, UA Negative Negative   Appearance     Odor    Results for orders placed or performed in visit on 07/12/20 (from the past 336 hour(s))  POC Urinalysis Dipstick OB   Collection Time: 07/12/20 11:53 AM  Result Value Ref Range   Color, UA     Clarity, UA     Glucose, UA Negative Negative   Bilirubin, UA     Ketones, UA neg    Spec Grav, UA     Blood, UA neg    pH, UA     POC,PROTEIN,UA Negative Negative, Trace, Small (1+), Moderate (2+), Large (3+), 4+   Urobilinogen, UA     Nitrite, UA neg    Leukocytes, UA Negative Negative   Appearance     Odor    Results for orders placed or performed in visit on 07/09/20 (from the past 336 hour(s))  POC Urinalysis Dipstick OB   Collection  Time: 07/09/20 12:24 PM  Result Value Ref Range   Color, UA     Clarity, UA     Glucose, UA Negative Negative   Bilirubin, UA     Ketones, UA neg    Spec Grav, UA     Blood, UA trace    pH, UA     POC,PROTEIN,UA Negative  Negative, Trace, Small (1+), Moderate (2+), Large (3+), 4+   Urobilinogen, UA     Nitrite, UA neg    Leukocytes, UA Large (3+) (A) Negative   Appearance     Odor     US OB Follow Up  Result Date: 07/14/2020 FOLLOW UP SONOGRAM [place] ANNAMAY LAYMON is in the office for a follow up sonogram for EFW,BPP and cord dopplers. She is a 24 y.o. year old G62P2002 with Estimated Date of Delivery: 09/01/20 by LMP now at  [redacted]w[redacted]d weeks gestation. Thus far the pregnancy has been complicated by [facility] GESTATION: SINGLETON PRESENTATION: cephalic FETAL ACTIVITY:          Heart rate         142          The fetus is active. AMNIOTIC FLUID: The amniotic fluid volume is  normal, 12.3 cm. PLACENTA LOCALIZATION:  posterior GRADE 0 CERVIX: Limited view GESTATIONAL AGE AND  BIOMETRICS: Gestational criteria: Estimated Date of Delivery: 09/01/20 by LMP now at [redacted]w[redacted]d Previous Scans:4          BIPARIETAL DIAMETER           7.66 cm         30+5 weeks HEAD CIRCUMFERENCE           28.20 cm         30+6 weeks ABDOMINAL CIRCUMFERENCE           28.90 cm         32+6 weeks FEMUR LENGTH           6.01 cm         31+1 weeks                                                       AVERAGE EGA(BY THIS SCAN):  31+2 weeks                                                 ESTIMATED FETAL WEIGHT:       1887  grams, 58 % . BIOPHYSICAL PROFILE:                                                                                                      COMMENTS GROSS BODY MOVEMENT                 2  TONE                2  RESPIRATIONS                2  AMNIOTIC FLUID                2  SCORE:  8/8 (Note: NST was not performed as part of this antepartum testing) DOPPLER FLOW STUDIES: UMBILICAL ARTERY RI  RATIOS:   .63,.70,.58,.57=35% ANATOMICAL SURVEY                                                                            COMMENTS CEREBRAL VENTRICLES yes normal  CHOROID PLEXUS yes normal  CEREBELLUM yes normal  CISTERNA MAGNA yes normal              NOSE/LIP yes normal  FACIAL PROFILE yes normal  4 CHAMBERED HEART yes normal  OUTFLOW TRACTS yes normal  DIAPHRAGM yes normal  STOMACH yes normal  RENAL REGION yes normal  BLADDER yes normal      3 VESSEL CORD yes normal              GENITALIA yes normal female     SUSPECTED ABNORMALITIES:  no QUALITY OF SCAN: satisfactory TECHNICIAN COMMENTS: [place] 57+3 wks,cephalic,fhr 220 bpm,posterior placenta gr 0,afi 12.3 cm.RI .63,.70,.58,.57=35%,BPP 8/8,EFW 1887 G 58% A copy of this report including all images has been saved and backed up to a second source for retrieval if needed. All measures and details of the anatomical scan, placentation, fluid volume and pelvic anatomy are contained in that report. Amber Heide Guile 07/03/2020 10:27 AM Clinical Impression and recommendations: I have reviewed the sonogram results above, combined with the patient's current clinical course, below are my impressions and any appropriate recommendations for management based on the sonographic findings. 1.  [id number] Estimated Date of Delivery: 09/01/20 by  LMP, midtrimester ultrasound and confirmed by today's sonographic dating 2.  Normal fetal sonographic findings, specifically prior normal detailed anatomical evaluation,      no   New abnormalities noted 3.  Normal general sonographic findings including weight at 58%ile, and specifically reassuring fetal assessment with good BPP, 8/8 ,fluid and excellent Doppler flow ,RI  35% Recommend routine prenatal care based on this sonogram or as clinically indicated Jonnie Kind 07/14/2020 6:38 AM   US Fetal BPP W/O Non Stress  Result Date: 07/14/2020 FOLLOW UP SONOGRAM [place] KENNDRA MORRIS is in the office for a follow up sonogram for EFW,BPP and cord dopplers. She  is a 24 y.o. year old G33P2002 with Estimated Date of Delivery: 09/01/20 by LMP now at  107w3d weeks gestation. Thus far the pregnancy has been complicated by [facility]. GESTATION: SINGLETON PRESENTATION: cephalic FETAL ACTIVITY:          Heart rate         142          The fetus is active. AMNIOTIC FLUID: The amniotic fluid volume is  normal, 12.3 cm. PLACENTA LOCALIZATION:  posterior GRADE 0 CERVIX: Limited view GESTATIONAL AGE AND  BIOMETRICS: Gestational criteria: Estimated Date of Delivery: 09/01/20 by LMP now at [redacted]w[redacted]d Previous Scans:4          BIPARIETAL DIAMETER           7.66 cm         30+5 weeks HEAD CIRCUMFERENCE           28.20 cm         30+6 weeks ABDOMINAL CIRCUMFERENCE  28.90 cm         32+6 weeks FEMUR LENGTH           6.01 cm         31+1 weeks                                                       AVERAGE EGA(BY THIS SCAN):  31+2 weeks                                                 ESTIMATED FETAL WEIGHT:       1887  grams, 58 % . BIOPHYSICAL PROFILE:                                                                                                      COMMENTS GROSS BODY MOVEMENT                 2  TONE                2  RESPIRATIONS                2  AMNIOTIC FLUID                2                                                          SCORE:  8/8 (Note: NST was not performed as part of this antepartum testing) DOPPLER FLOW STUDIES: UMBILICAL ARTERY RI RATIOS:   .63,.70,.58,.57=35% ANATOMICAL SURVEY                                                                            COMMENTS CEREBRAL VENTRICLES yes normal  CHOROID PLEXUS yes normal  CEREBELLUM yes normal  CISTERNA MAGNA yes normal              NOSE/LIP yes normal  FACIAL PROFILE yes normal  4 CHAMBERED HEART yes normal  OUTFLOW TRACTS yes normal  DIAPHRAGM yes normal  STOMACH yes normal  RENAL REGION yes normal  BLADDER yes normal      3 VESSEL CORD yes normal              GENITALIA yes normal female     SUSPECTED ABNORMALITIES:  no  QUALITY OF SCAN: satisfactory TECHNICIAN COMMENTS: [place] 78+9 wks,cephalic,fhr 381 bpm,posterior placenta gr 0,afi 12.3 cm.RI .63,.70,.58,.57=35%,BPP 8/8,EFW 1887 G 58% A copy of this report including all images has been saved and backed up to a second source for retrieval if needed. All measures and details of the anatomical scan, placentation, fluid volume and pelvic anatomy are contained in that report. Amber Heide Guile 07/03/2020 10:27 AM Clinical Impression and recommendations: I have reviewed the sonogram results above, combined with the patient's current clinical course, below are my impressions and any appropriate recommendations for management based on the sonographic findings. 1.  [id number] Estimated Date of Delivery: 09/01/20 by  LMP, midtrimester ultrasound and confirmed by today's sonographic dating 2.  Normal fetal sonographic findings, specifically prior normal detailed anatomical evaluation,      no   New abnormalities noted 3.  Normal general sonographic findings including weight at 58%ile, and specifically reassuring fetal assessment with good BPP, 8/8 ,fluid and excellent Doppler flow ,RI  35% Recommend routine prenatal care based on this sonogram or as clinically indicated Jonnie Kind 07/14/2020 6:38 AM   US Fetal BPP W/O Non Stress  Result Date: 07/09/2020 FOLLOW UP SONOGRAM [place] IRMGARD RAMPERSAUD is in the office for a follow up sonogram for BPP and cord dopplers. She is a 24 y.o. year old G27P2002 with Estimated Date of Delivery: 09/01/20 by LMP now at  [redacted]w[redacted]d weeks gestation. Thus far the pregnancy has been complicated by [facility]. GESTATION: SINGLETON PRESENTATION: cephalic FETAL ACTIVITY:          Heart rate         124          The fetus is active. AMNIOTIC FLUID: The amniotic fluid volume is  normal, 13 cm. PLACENTA LOCALIZATION:  posterior GRADE 0 CERVIX: Limited view Previous Scans:5 BIOPHYSICAL PROFILE:                                                                                                       COMMENTS GROSS BODY MOVEMENT                 2  TONE                2  RESPIRATIONS                2  AMNIOTIC FLUID                2                                                          SCORE:  8/8 (Note: NST was not performed as part of this antepartum testing) DOPPLER FLOW STUDIES: UMBILICAL ARTERY RI RATIOS:   .55,.62,.52,.58=35%  ANATOMICAL SURVEY                                                                            COMMENTS CEREBRAL VENTRICLES yes normal  CHOROID PLEXUS yes normal  CEREBELLUM yes normal  CISTERNA MAGNA yes normal                  FACIAL PROFILE yes normal  4 CHAMBERED HEART yes normal  OUTFLOW TRACTS yes normal  DIAPHRAGM yes normal  STOMACH yes normal  RENAL REGION yes normal  BLADDER yes normal  CORD INSERTION yes normal  3 VESSEL CORD yes normal              GENITALIA yes normal female     SUSPECTED ABNORMALITIES:  no QUALITY OF SCAN: satisfactory TECHNICIAN COMMENTS: [place] 32+2 wks.cephalic,BPP 7/3,XTG 626 BPM,posterior placenta gr 0,AFI 13 cm.RI .55,.62,.52,.58=35% A copy of this report including all images has been saved and backed up to a second source for retrieval if needed. All measures and details of the anatomical scan, placentation, fluid volume and pelvic anatomy are contained in that report. Amber Heide Guile 07/09/2020 12:24 PM Clinical Impression and recommendations: I have reviewed the sonogram results above, combined with the patient's current clinical course, below are my impressions and any appropriate recommendations for management based on the sonographic findings. 1.  [id number] Estimated Date of Delivery: 09/01/20 by serial sonographic evaluations 2.  Fetal sonographic surveillance findings: a). Normal fluid volume b). Normal antepartum fetal assessment with BPP 8/8 c). Normal fetal Doppler ratios with consistent diastolic flow 70% 3.  Normal general sonographic findings Recommend continued prenatal evaluations and care based on  this sonogram and as clinically indicated from the patient's clinical course. Florian Buff 07/09/2020 2:29 PM   [place] UA Cord Doppler  Result Date: 07/14/2020 FOLLOW UP SONOGRAM [place] NOELENE GANG is in the office for a follow up sonogram for EFW,BPP and cord dopplers. She is a 24 y.o. year old G59P2002 with Estimated Date of Delivery: 09/01/20 by LMP now at  [redacted]w[redacted]d weeks gestation. Thus far the pregnancy has been complicated by [facility]. GESTATION: SINGLETON PRESENTATION: cephalic FETAL ACTIVITY:          Heart rate         142          The fetus is active. AMNIOTIC FLUID: The amniotic fluid volume is  normal, 12.3 cm. PLACENTA LOCALIZATION:  posterior GRADE 0 CERVIX: Limited view GESTATIONAL AGE AND  BIOMETRICS: Gestational criteria: Estimated Date of Delivery: 09/01/20 by LMP now at [redacted]w[redacted]d Previous Scans:4          BIPARIETAL DIAMETER           7.66 cm         30+5 weeks HEAD CIRCUMFERENCE           28.20 cm         30+6 weeks ABDOMINAL CIRCUMFERENCE           28.90 cm         32+6 weeks FEMUR LENGTH           6.01 cm         31+1 weeks  AVERAGE EGA(BY THIS SCAN):  31+2 weeks                                                 ESTIMATED FETAL WEIGHT:       1887  grams, 58 % . BIOPHYSICAL PROFILE:                                                                                                      COMMENTS GROSS BODY MOVEMENT                 2  TONE                2  RESPIRATIONS                2  AMNIOTIC FLUID                2                                                          SCORE:  8/8 (Note: NST was not performed as part of this antepartum testing) DOPPLER FLOW STUDIES: UMBILICAL ARTERY RI RATIOS:   .63,.70,.58,.57=35% ANATOMICAL SURVEY                                                                            COMMENTS CEREBRAL VENTRICLES yes normal  CHOROID PLEXUS yes normal  CEREBELLUM yes normal  CISTERNA MAGNA yes normal              NOSE/LIP yes normal  FACIAL  PROFILE yes normal  4 CHAMBERED HEART yes normal  OUTFLOW TRACTS yes normal  DIAPHRAGM yes normal  STOMACH yes normal  RENAL REGION yes normal  BLADDER yes normal      3 VESSEL CORD yes normal              GENITALIA yes normal female     SUSPECTED ABNORMALITIES:  no QUALITY OF SCAN: satisfactory TECHNICIAN COMMENTS: [place] 16+1 wks,cephalic,fhr 096 bpm,posterior placenta gr 0,afi 12.3 cm.RI .63,.70,.58,.57=35%,BPP 8/8,EFW 1887 G 58% A copy of this report including all images has been saved and backed up to a second source for retrieval if needed. All measures and details of the anatomical scan, placentation, fluid volume and pelvic anatomy are contained in that report. Amber Heide Guile 07/03/2020 10:27 AM Clinical Impression and recommendations: I have reviewed the sonogram results above, combined with the patient's current clinical course, below are my  impressions and any appropriate recommendations for management based on the sonographic findings. 1.  [id number] Estimated Date of Delivery: 09/01/20 by  LMP, midtrimester ultrasound and confirmed by today's sonographic dating 2.  Normal fetal sonographic findings, specifically prior normal detailed anatomical evaluation,      no   New abnormalities noted 3.  Normal general sonographic findings including weight at 58%ile, and specifically reassuring fetal assessment with good BPP, 8/8 ,fluid and excellent Doppler flow ,RI  35% Recommend routine prenatal care based on this sonogram or as clinically indicated Jonnie Kind 07/14/2020 6:38 AM   [place] UA Cord Doppler  Result Date: 07/09/2020 FOLLOW UP SONOGRAM [place] CARAGH GASPER is in the office for a follow up sonogram for BPP and cord dopplers. She is a 24 y.o. year old G63P2002 with Estimated Date of Delivery: 09/01/20 by LMP now at  [redacted]w[redacted]d weeks gestation. Thus far the pregnancy has been complicated by [facility]. GESTATION: SINGLETON PRESENTATION: cephalic FETAL ACTIVITY:          Heart rate         124          The fetus is active. AMNIOTIC  FLUID: The amniotic fluid volume is  normal, 13 cm. PLACENTA LOCALIZATION:  posterior GRADE 0 CERVIX: Limited view Previous Scans:5 BIOPHYSICAL PROFILE:                                                                                                      COMMENTS GROSS BODY MOVEMENT                 2  TONE                2  RESPIRATIONS                2  AMNIOTIC FLUID                2                                                          SCORE:  8/8 (Note: NST was not performed as part of this antepartum testing) DOPPLER FLOW STUDIES: UMBILICAL ARTERY RI RATIOS:   .55,.62,.52,.58=35%                                            ANATOMICAL SURVEY  COMMENTS CEREBRAL VENTRICLES yes normal  CHOROID PLEXUS yes normal  CEREBELLUM yes normal  CISTERNA MAGNA yes normal                  FACIAL PROFILE yes normal  4 CHAMBERED HEART yes normal  OUTFLOW TRACTS yes normal  DIAPHRAGM yes normal  STOMACH yes normal  RENAL REGION yes normal  BLADDER yes normal  CORD INSERTION yes normal  3 VESSEL CORD yes normal              GENITALIA yes normal female     SUSPECTED ABNORMALITIES:  no QUALITY OF SCAN: satisfactory TECHNICIAN COMMENTS: [place] 32+2 wks.cephalic,BPP 8/6,VEH 209 BPM,posterior placenta gr 0,AFI 13 cm.RI .55,.62,.52,.58=35% A copy of this report including all images has been saved and backed up to a second source for retrieval if needed. All measures and details of the anatomical scan, placentation, fluid volume and pelvic anatomy are contained in that report. Amber Heide Guile 07/09/2020 12:24 PM Clinical Impression and recommendations: I have reviewed the sonogram results above, combined with the patient's current clinical course, below are my impressions and any appropriate recommendations for management based on the sonographic findings. 1.  [id number] Estimated Date of Delivery: 09/01/20 by serial sonographic evaluations 2.  Fetal sonographic surveillance  findings: a). Normal fluid volume b). Normal antepartum fetal assessment with BPP 8/8 c). Normal fetal Doppler ratios with consistent diastolic flow 36% 3.  Normal general sonographic findings Recommend continued prenatal evaluations and care based on this sonogram and as clinically indicated from the patient's clinical course. Florian Buff 07/09/2020 2:29 PM      Assessment and Plan:    Chtn on labetalol, needs to reduce meds due to hypotension from Covid infection. Will investigate if pt gets IV tx.,       I discussed the assessment and treatment plan with the patient. The patient was provided an opportunity to ask questions and all were answered. The patient agreed with the plan and demonstrated an understanding of the instructions.   The patient was advised to call back or seek an in-person evaluation/go to the ED if the symptoms worsen or if the condition fails to improve as anticipated.  I provided 10 minutes of non-face-to-face time during this encounter. w  Jonnie Kind, MD [place]              [facility] [place] [address] Dept: [phone] Dept Fax: [phone]   By signing my name below, I, Jacqualyn Posey, attest that this documentation has been prepared under the direction and in the presence of Jonnie Kind, MD. Electronically Signed: [place]. 07/23/20. 11:04 AM.  .

## 2020-07-24 ENCOUNTER — Telehealth: Payer: Self-pay | Admitting: Obstetrics and Gynecology

## 2020-07-24 LAB — CULTURE, GROUP A STREP (THRC): Special Requests: NORMAL

## 2020-07-24 NOTE — Telephone Encounter (Signed)
Pt not available for phone call Pt has low grade infection with covid, malaise,and temp to 99.5 I have left message with [place] at [phone], to inquire if she would be candidate for Monoclonal antibody therapy.

## 2020-07-24 NOTE — Telephone Encounter (Signed)
Had my chart visit with [place] yesterday and he told  Her to call back today if she had not heard from [place] , in ref to an injection for Covid.

## 2020-07-25 ENCOUNTER — Encounter (HOSPITAL_COMMUNITY): Payer: Self-pay | Admitting: Obstetrics and Gynecology

## 2020-07-25 ENCOUNTER — Telehealth: Payer: Self-pay | Admitting: Obstetrics and Gynecology

## 2020-07-25 ENCOUNTER — Other Ambulatory Visit: Payer: Self-pay

## 2020-07-25 ENCOUNTER — Inpatient Hospital Stay (HOSPITAL_COMMUNITY): Payer: PRIVATE HEALTH INSURANCE

## 2020-07-25 ENCOUNTER — Inpatient Hospital Stay (HOSPITAL_COMMUNITY)
Admission: AD | Admit: 2020-07-25 | Discharge: 2020-07-27 | DRG: 831 | Disposition: A | Discharge status: Home / Self Care | Payer: PRIVATE HEALTH INSURANCE | Attending: Obstetrics and Gynecology | Admitting: Obstetrics and Gynecology

## 2020-07-25 DIAGNOSIS — O10013 Pre-existing essential hypertension complicating pregnancy, third trimester: Secondary | ICD-10-CM | POA: Diagnosis not present

## 2020-07-25 DIAGNOSIS — O10919 Unspecified pre-existing hypertension complicating pregnancy, unspecified trimester: Secondary | ICD-10-CM

## 2020-07-25 DIAGNOSIS — A419 Sepsis, unspecified organism: Secondary | ICD-10-CM | POA: Diagnosis present

## 2020-07-25 DIAGNOSIS — O10913 Unspecified pre-existing hypertension complicating pregnancy, third trimester: Secondary | ICD-10-CM | POA: Diagnosis not present

## 2020-07-25 DIAGNOSIS — E871 Hypo-osmolality and hyponatremia: Secondary | ICD-10-CM | POA: Diagnosis present

## 2020-07-25 DIAGNOSIS — D649 Anemia, unspecified: Secondary | ICD-10-CM | POA: Diagnosis present

## 2020-07-25 DIAGNOSIS — O368390 Maternal care for abnormalities of the fetal heart rate or rhythm, unspecified trimester, not applicable or unspecified: Secondary | ICD-10-CM

## 2020-07-25 DIAGNOSIS — U071 COVID-19: Secondary | ICD-10-CM | POA: Diagnosis not present

## 2020-07-25 DIAGNOSIS — O34219 Maternal care for unspecified type scar from previous cesarean delivery: Secondary | ICD-10-CM | POA: Diagnosis present

## 2020-07-25 DIAGNOSIS — Z3689 Encounter for other specified antenatal screening: Secondary | ICD-10-CM

## 2020-07-25 DIAGNOSIS — O2343 Unspecified infection of urinary tract in pregnancy, third trimester: Secondary | ICD-10-CM | POA: Diagnosis not present

## 2020-07-25 DIAGNOSIS — O99019 Anemia complicating pregnancy, unspecified trimester: Secondary | ICD-10-CM | POA: Diagnosis not present

## 2020-07-25 DIAGNOSIS — Z3A34 34 weeks gestation of pregnancy: Secondary | ICD-10-CM

## 2020-07-25 DIAGNOSIS — O99213 Obesity complicating pregnancy, third trimester: Secondary | ICD-10-CM | POA: Diagnosis present

## 2020-07-25 DIAGNOSIS — Z86718 Personal history of other venous thrombosis and embolism: Secondary | ICD-10-CM | POA: Diagnosis not present

## 2020-07-25 DIAGNOSIS — O99013 Anemia complicating pregnancy, third trimester: Secondary | ICD-10-CM | POA: Diagnosis present

## 2020-07-25 DIAGNOSIS — O98513 Other viral diseases complicating pregnancy, third trimester: Secondary | ICD-10-CM | POA: Diagnosis not present

## 2020-07-25 DIAGNOSIS — O99283 Endocrine, nutritional and metabolic diseases complicating pregnancy, third trimester: Secondary | ICD-10-CM | POA: Diagnosis present

## 2020-07-25 DIAGNOSIS — E876 Hypokalemia: Secondary | ICD-10-CM | POA: Diagnosis present

## 2020-07-25 DIAGNOSIS — I10 Essential (primary) hypertension: Secondary | ICD-10-CM | POA: Diagnosis not present

## 2020-07-25 DIAGNOSIS — Z86711 Personal history of pulmonary embolism: Secondary | ICD-10-CM

## 2020-07-25 DIAGNOSIS — K582 Mixed irritable bowel syndrome: Secondary | ICD-10-CM

## 2020-07-25 DIAGNOSIS — J1282 Pneumonia due to coronavirus disease 2019: Secondary | ICD-10-CM | POA: Diagnosis not present

## 2020-07-25 DIAGNOSIS — O99513 Diseases of the respiratory system complicating pregnancy, third trimester: Secondary | ICD-10-CM | POA: Diagnosis present

## 2020-07-25 DIAGNOSIS — R7401 Elevation of levels of liver transaminase levels: Secondary | ICD-10-CM | POA: Diagnosis present

## 2020-07-25 DIAGNOSIS — R0602 Shortness of breath: Secondary | ICD-10-CM

## 2020-07-25 DIAGNOSIS — Z8759 Personal history of other complications of pregnancy, childbirth and the puerperium: Secondary | ICD-10-CM

## 2020-07-25 DIAGNOSIS — Z98891 History of uterine scar from previous surgery: Secondary | ICD-10-CM

## 2020-07-25 DIAGNOSIS — O0990 Supervision of high risk pregnancy, unspecified, unspecified trimester: Secondary | ICD-10-CM

## 2020-07-25 HISTORY — DX: Unspecified infectious disease: B99.9

## 2020-07-25 HISTORY — DX: Headache, unspecified: R51.9

## 2020-07-25 LAB — TROPONIN I (HIGH SENSITIVITY)
Troponin I (High Sensitivity): 4 ng/L (ref <18)
Troponin I (High Sensitivity): 4 ng/L (ref <18)

## 2020-07-25 LAB — CBC WITH DIFFERENTIAL/PLATELET
Abs Immature Granulocytes: 0 10*3/uL (ref 0.00–0.07)
Basophils Absolute: 0 10*3/uL (ref 0.0–0.1)
Basophils Relative: 0 %
Eosinophils Absolute: 0 10*3/uL (ref 0.0–0.5)
Eosinophils Relative: 0 %
HCT: 32 % — ABNORMAL LOW (ref 36.0–46.0)
Hemoglobin: 9.6 g/dL — ABNORMAL LOW (ref 12.0–15.0)
Lymphocytes Relative: 3 %
Lymphs Abs: 0.2 10*3/uL — ABNORMAL LOW (ref 0.7–4.0)
MCH: 20.6 pg — ABNORMAL LOW (ref 26.0–34.0)
MCHC: 30 g/dL (ref 30.0–36.0)
MCV: 68.8 fL — ABNORMAL LOW (ref 80.0–100.0)
Monocytes Absolute: 0 10*3/uL — ABNORMAL LOW (ref 0.1–1.0)
Monocytes Relative: 0 %
Neutro Abs: 7.6 10*3/uL (ref 1.7–7.7)
Neutrophils Relative %: 97 %
Platelets: 227 10*3/uL (ref 150–400)
RBC: 4.65 MIL/uL (ref 3.87–5.11)
RDW: 17 % — ABNORMAL HIGH (ref 11.5–15.5)
WBC: 7.8 10*3/uL (ref 4.0–10.5)
nRBC: 0 /100 WBC
nRBC: 0.4 % — ABNORMAL HIGH (ref 0.0–0.2)

## 2020-07-25 LAB — URINALYSIS, ROUTINE W REFLEX MICROSCOPIC
Glucose, UA: NEGATIVE mg/dL
Hgb urine dipstick: NEGATIVE
Ketones, ur: 80 mg/dL — AB
Nitrite: NEGATIVE
Protein, ur: 100 mg/dL — AB
Specific Gravity, Urine: 1.035 — ABNORMAL HIGH (ref 1.005–1.030)
pH: 6 (ref 5.0–8.0)

## 2020-07-25 LAB — LACTIC ACID, PLASMA
Lactic Acid, Venous: 1.5 mmol/L (ref 0.5–1.9)
Lactic Acid, Venous: 1.5 mmol/L (ref 0.5–1.9)

## 2020-07-25 LAB — COMPREHENSIVE METABOLIC PANEL
ALT: 37 U/L (ref 0–44)
AST: 76 U/L — ABNORMAL HIGH (ref 15–41)
Albumin: 2.7 g/dL — ABNORMAL LOW (ref 3.5–5.0)
Alkaline Phosphatase: 175 U/L — ABNORMAL HIGH (ref 38–126)
Anion gap: 12 (ref 5–15)
BUN: 5 mg/dL — ABNORMAL LOW (ref 6–20)
CO2: 20 mmol/L — ABNORMAL LOW (ref 22–32)
Calcium: 7.9 mg/dL — ABNORMAL LOW (ref 8.9–10.3)
Chloride: 101 mmol/L (ref 98–111)
Creatinine, Ser: 0.74 mg/dL (ref 0.44–1.00)
GFR calc Af Amer: >60 mL/min (ref >60)
GFR calc non Af Amer: >60 mL/min (ref >60)
Glucose, Bld: 86 mg/dL (ref 70–99)
Potassium: 3.3 mmol/L — ABNORMAL LOW (ref 3.5–5.1)
Sodium: 133 mmol/L — ABNORMAL LOW (ref 135–145)
Total Bilirubin: 0.9 mg/dL (ref 0.3–1.2)
Total Protein: 5.9 g/dL — ABNORMAL LOW (ref 6.5–8.1)

## 2020-07-25 LAB — TYPE AND SCREEN
ABO/RH(D): O POS
Antibody Screen: NEGATIVE

## 2020-07-25 LAB — C-REACTIVE PROTEIN: CRP: 9 mg/dL — ABNORMAL HIGH (ref <1.0)

## 2020-07-25 LAB — HEPATITIS B SURFACE ANTIGEN: Hepatitis B Surface Ag: NONREACTIVE

## 2020-07-25 LAB — PROCALCITONIN: Procalcitonin: 0.78 ng/mL

## 2020-07-25 LAB — PLATELET COUNT: Platelets: 242 10*3/uL (ref 150–400)

## 2020-07-25 LAB — BRAIN NATRIURETIC PEPTIDE: B Natriuretic Peptide: 26.2 pg/mL (ref 0.0–100.0)

## 2020-07-25 LAB — SAVE SMEAR(SSMR), FOR PROVIDER SLIDE REVIEW

## 2020-07-25 LAB — APTT: aPTT: 32 seconds (ref 24–36)

## 2020-07-25 LAB — FERRITIN: Ferritin: 35 ng/mL (ref 11–307)

## 2020-07-25 LAB — PROTEIN / CREATININE RATIO, URINE
Creatinine, Urine: 343.68 mg/dL
Protein Creatinine Ratio: 0.32 mg/mg{Cre} — ABNORMAL HIGH (ref 0.00–0.15)
Total Protein, Urine: 109 mg/dL

## 2020-07-25 LAB — FIBRINOGEN: Fibrinogen: 507 mg/dL — ABNORMAL HIGH (ref 210–475)

## 2020-07-25 LAB — PROTIME-INR
INR: 1 (ref 0.8–1.2)
Prothrombin Time: 12.9 seconds (ref 11.4–15.2)

## 2020-07-25 LAB — MAGNESIUM: Magnesium: 1.5 mg/dL — ABNORMAL LOW (ref 1.7–2.4)

## 2020-07-25 LAB — LACTATE DEHYDROGENASE: LDH: 216 U/L — ABNORMAL HIGH (ref 98–192)

## 2020-07-25 MED ORDER — ACETAMINOPHEN 10 MG/ML IV SOLN
1000.0000 mg | Freq: Four times a day (QID) | INTRAVENOUS | Status: DC
  Filled 2020-07-25: qty 100

## 2020-07-25 MED ORDER — POTASSIUM CHLORIDE 20 MEQ PO PACK
40.0000 meq | PACK | Freq: Once | ORAL | Status: AC
  Administered 2020-07-25: 40 meq via ORAL
  Filled 2020-07-25: qty 2

## 2020-07-25 MED ORDER — ZINC SULFATE 220 (50 ZN) MG PO CAPS
220.0000 mg | ORAL_CAPSULE | Freq: Every day | ORAL | Status: DC
  Administered 2020-07-25 – 2020-07-27 (×3): 220 mg via ORAL
  Filled 2020-07-25 (×3): qty 1

## 2020-07-25 MED ORDER — METOCLOPRAMIDE HCL 5 MG/ML IJ SOLN
10.0000 mg | Freq: Four times a day (QID) | INTRAMUSCULAR | Status: DC | PRN
  Administered 2020-07-26 – 2020-07-27 (×3): 10 mg via INTRAVENOUS
  Filled 2020-07-25 (×3): qty 2

## 2020-07-25 MED ORDER — SODIUM CHLORIDE 0.9 % IV SOLN
1.0000 g | INTRAVENOUS | Status: DC
  Administered 2020-07-25 – 2020-07-26 (×2): 1 g via INTRAVENOUS
  Filled 2020-07-25 (×3): qty 10

## 2020-07-25 MED ORDER — ACETAMINOPHEN 500 MG PO TABS
500.0000 mg | ORAL_TABLET | ORAL | Status: DC | PRN
  Administered 2020-07-25 – 2020-07-27 (×3): 500 mg via ORAL
  Filled 2020-07-25 (×3): qty 1

## 2020-07-25 MED ORDER — ACETAMINOPHEN 10 MG/ML IV SOLN
1000.0000 mg | Freq: Once | INTRAVENOUS | Status: AC
  Administered 2020-07-25: 1000 mg via INTRAVENOUS
  Filled 2020-07-25: qty 100

## 2020-07-25 MED ORDER — METHYLPREDNISOLONE SODIUM SUCC 40 MG IJ SOLR
0.2500 mg/kg | Freq: Two times a day (BID) | INTRAMUSCULAR | Status: DC
  Administered 2020-07-25 – 2020-07-27 (×4): 24.8 mg via INTRAVENOUS
  Filled 2020-07-25 (×6): qty 0.62

## 2020-07-25 MED ORDER — SODIUM CHLORIDE 0.9 % IV SOLN
200.0000 mg | Freq: Once | INTRAVENOUS | Status: AC
  Administered 2020-07-25: 200 mg via INTRAVENOUS
  Filled 2020-07-25: qty 40

## 2020-07-25 MED ORDER — GUAIFENESIN-DM 100-10 MG/5ML PO SYRP
10.0000 mL | ORAL_SOLUTION | ORAL | Status: DC | PRN
  Administered 2020-07-25 – 2020-07-27 (×2): 10 mL via ORAL
  Filled 2020-07-25 (×4): qty 10

## 2020-07-25 MED ORDER — ENOXAPARIN SODIUM 60 MG/0.6ML ~~LOC~~ SOLN
50.0000 mg | SUBCUTANEOUS | Status: DC
  Administered 2020-07-25 – 2020-07-26 (×2): 50 mg via SUBCUTANEOUS
  Filled 2020-07-25 (×2): qty 0.6

## 2020-07-25 MED ORDER — SODIUM CHLORIDE 0.9 % IV SOLN
8.0000 mg | Freq: Once | INTRAVENOUS | Status: AC
  Administered 2020-07-25: 8 mg via INTRAVENOUS
  Filled 2020-07-25: qty 4

## 2020-07-25 MED ORDER — FAMOTIDINE IN NACL 20-0.9 MG/50ML-% IV SOLN
20.0000 mg | Freq: Once | INTRAVENOUS | Status: AC
  Administered 2020-07-25: 20 mg via INTRAVENOUS
  Filled 2020-07-25: qty 50

## 2020-07-25 MED ORDER — HYDROCOD POLST-CPM POLST ER 10-8 MG/5ML PO SUER: 5.0000 mL | Freq: Two times a day (BID) | ORAL | Status: DC | PRN

## 2020-07-25 MED ORDER — ALBUTEROL SULFATE (2.5 MG/3ML) 0.083% IN NEBU: 2.5000 mg | INHALATION_SOLUTION | Freq: Four times a day (QID) | RESPIRATORY_TRACT | Status: DC | PRN

## 2020-07-25 MED ORDER — ASCORBIC ACID 500 MG PO TABS
500.0000 mg | ORAL_TABLET | Freq: Every day | ORAL | Status: DC
  Administered 2020-07-25 – 2020-07-26 (×2): 500 mg via ORAL
  Filled 2020-07-25 (×2): qty 1

## 2020-07-25 MED ORDER — ACETAMINOPHEN 650 MG RE SUPP: 650.0000 mg | Freq: Once | RECTAL | Status: DC

## 2020-07-25 MED ORDER — SODIUM CHLORIDE 0.9 % IV SOLN
100.0000 mg | Freq: Every day | INTRAVENOUS | Status: DC
  Administered 2020-07-26 – 2020-07-27 (×2): 100 mg via INTRAVENOUS
  Filled 2020-07-25: qty 100
  Filled 2020-07-25 (×3): qty 20

## 2020-07-25 MED ORDER — LACTATED RINGERS IV BOLUS
1000.0000 mL | Freq: Once | INTRAVENOUS | Status: AC
  Administered 2020-07-25: 1000 mL via INTRAVENOUS

## 2020-07-25 NOTE — Progress Notes (Signed)
Subjective: CTSP regarding pharmacy request.  Pharmacy concerned that pt has hx of DVT/PE.  Review of history and direct conversation with the patient shows she has no history of either condition. Objective: Vital signs in last 24 hours: Temp:  [98.6 F (37 C)-101 F (38.3 C)] 100 F (37.8 C) (08/19 2058) Pulse Rate:  [111-132] 120 (08/19 2058) Resp:  [18-32] 18 (08/19 1810) BP: (94-120)/(45-67) 120/65 (08/19 2058) SpO2:  [96 %-100 %] 100 % (08/19 2058) Weight:  [99.7 kg] 99.7 kg (08/19 1430) Weight change:    Lab Results: Recent Labs    07/25/20 1517 07/25/20 1651  WBC 7.8  --   HGB 9.6*  --   HCT 32.0*  --   PLT 227 242   BMET:  Recent Labs    07/25/20 1517  NA 133*  K 3.3*  CL 101  CO2 20*  GLUCOSE 86  BUN 5*  CREATININE 0.74  CALCIUM 7.9*   No results for input(s): PTH in the last 72 hours. Iron Studies:  Recent Labs    07/25/20 1752  FERRITIN 35   Studies/Results: DG CHEST PORT 1 VIEW  Result Date: 07/25/2020 CLINICAL DATA:  Short of breath, COVID-19 positive, pregnant EXAM: PORTABLE CHEST 1 VIEW COMPARISON:  01/29/2019 FINDINGS: Single frontal view of the chest demonstrates a stable cardiac silhouette. There is patchy bilateral ground-glass airspace disease consistent with multifocal pneumonia given COVID-19 positivity. Mild central vascular congestion likely due to gravid state. No effusion or pneumothorax. No acute bony abnormalities. IMPRESSION: 1. Multifocal bilateral ground-glass airspace disease consistent with pneumonia. Pattern is consistent with COVID-19. Electronically Signed   By: Randa Ngo M.D.   On: 07/25/2020 16:15    I have reviewed the patient's current medications.  Assessment/Plan: 34.4 weeks, covid pneumonia Pharmacy contacted MD again, even though there is no history of PE/DVT, she does have two hypercoagulable states ie pregnancy and active covid.  They recommend prophylactic dosing 0.5 mg/kg for treatment. Will reassess patient  in the AM.   LOS: 0 days   Griffin Basil 07/25/2020,9:56 PM

## 2020-07-25 NOTE — Plan of Care (Signed)
PCCM received call as part of sepsis order set.  Clinical case reviewed ([redacted]wk gestation, COVID-19+, in ED with SOB, possible sepsis. SpO2 98% on RA, HDS. Receiving IVF) with requesting team and with PCCM physician. Agree with OB's plan to engage hospitalist team, at this time PCCM will not consult.   Please reach out if we can be of further assistance   Eliseo Gum MSN, AGACNP-BC [place] If no answer, [phone] 07/25/2020, 4:36 PM

## 2020-07-25 NOTE — MAU Note (Signed)
hospitalist contacted at [phone]; question about labs ordered, some had just been done 40 min prior.  She said do not repeat them at this time

## 2020-07-25 NOTE — Telephone Encounter (Signed)
Pt got to where she couldn't keep anything down so she went to [facility]. Encounter closed. [place]

## 2020-07-25 NOTE — H&P (Signed)
[name] is a 24 y.o. G3P2002 at 105w4d who presents to MAU for vomiting. Patient tested positive for COVID on Sunday 07/21/2020. Patient reports she went to get tested because she was having a sore throat and a cough and some headaches and body aches. Patient reports she went to Urgent Care and was tested for COVID and told she could take Tylenol. Since that time, her symptoms have waxed and waned, but then patient reports she woke up this morning around 7AM and took some Tylenol, laid down on the couch and about 72minutes later she started throwing up. Patient endorses vomiting x3 today. Patient reports she has only had ice to eat/drink these past 2 days and denies any food intake during that time. Patient reports she tried to eat a potato last night, but states that she vomiting about 5 minutes after eating. Pt denies intercourse in the past 24 hours. Patient endorses nausea, vomiting, sore throat, cough, "a little shortness of breath." Patient reports she was taken off of her blood pressure medications by her OB due to low BP since COVID diagnosis.  Pt denies VB, LOF, ctx, decreased FM, vaginal discharge/odor/itching. Pt denies abdominal pain, constipation, diarrhea, or urinary problems. Pt denies chills, fatigue, sweating or changes in appetite. Pt denies chest pain. Pt denies dizziness, HA, light-headedness, weakness.  Problems this pregnancy include: cHTN, previous C/S x2, anemia, COVID+, IBS, asthma, depression, GERD, sciatica, morbid obesity. Allergies? Adhesive, lorabid, latex, orange fruit Current medications/supplements? Patient reports she stopped taking all of her medications two days ago, except she took Zofran this morning at 7AM and Tylenol 1000mg  at 7AM, but reports she vomited about 73min after taking Tylenol and has not taken any additional medications since then. Other meds on the patient's med list include: bASA, fioricet, Flexeril, iron, labetalol, flonase, cepacol, prilosec,  percocet, PNV, phenergan Prenatal care provider? [facility], next appt 07/26/2020   OB History    Gravida  3   Para  2   Term  2   Preterm      AB      Living  2     SAB      TAB      Ectopic      Multiple  0   Live Births  2          Past Medical History:  Diagnosis Date  . Anemia   . Anxiety   . Asthma    as child  . Endometritis 01/11/2015  . GERD (gastroesophageal reflux disease)   . Headache   . History of ovarian cyst 01/28/2015  . IBS (irritable bowel syndrome)   . Infection    UTI  . Nausea and vomiting 01/11/2015  . Ovarian cyst 01/14/2015  . Pelvic pain in female 01/11/2015  . Pregnancy induced hypertension    with pregnancy  . Pregnant 02/05/2016   Past Surgical History:  Procedure Laterality Date  . CESAREAN SECTION N/A 09/29/2014   Procedure: CESAREAN SECTION;  Surgeon: Jonnie Kind, MD;  Location: [facility];  Service: Obstetrics;  Laterality: N/A;  . CESAREAN SECTION N/A 09/25/2016   Procedure: CESAREAN SECTION;  Surgeon: Jonnie Kind, MD;  Location: [facility];  Service: Obstetrics;  Laterality: N/A;  . CHOLECYSTECTOMY N/A 03/07/2018   Procedure: LAPAROSCOPIC CHOLECYSTECTOMY;  Surgeon: Aviva Signs, MD;  Location: [facility];  Service: General;  Laterality: N/A;  . COLONOSCOPY WITH PROPOFOL N/A 08/02/2017   Dr. Gala Romney: moderate internal hemorhoids, distal 5 cm of TI  also appeared normal  . ESOPHAGOGASTRODUODENOSCOPY (EGD) WITH ESOPHAGEAL DILATION N/A 07/19/2013   Dr. Rourk:normal appearing esophagus s/p dilation, normal D1 and D2, unremarkable path   . ESOPHAGOGASTRODUODENOSCOPY (EGD) WITH PROPOFOL N/A 08/02/2017   Normal esophagus, small hiatal hernia, normal duodenum  . none    . WISDOM TOOTH EXTRACTION     Family History: family history includes Cancer in an other family member; Colon cancer in an other family member; Crohn's disease in an other family member; Diabetes in her paternal uncle; Heart disease in her maternal grandfather and  paternal uncle; Hypertension in her father, paternal aunt, and paternal uncle; Multiple sclerosis in her mother. Social History:  reports that she has never smoked. She has never used smokeless tobacco. She reports that she does not drink alcohol and does not use drugs.     Maternal Diabetes: No Genetic Screening: Normal Maternal Ultrasounds/Referrals: Normal Fetal Ultrasounds or other Referrals:  None Maternal Substance Abuse:  No Significant Maternal Medications:  Meds include: Other:  Patient reports she stopped taking all of her medications two days ago, except she took Zofran this morning at 7AM and Tylenol 1000mg  at 7AM, but reports she vomited about 13min after taking Tylenol and has not taken any additional medications since then. Other meds on the patient's med list include: bASA, fioricet, Flexeril, iron, labetalol, flonase, cepacol, prilosec, percocet, PNV, phenergan Significant Maternal Lab Results:  Other: COVID+ Other Comments:  COVID pneumonia as reason for admission  Review of Systems  Constitutional: Negative for chills, diaphoresis, fatigue and fever.  HENT: Positive for sore throat.   Eyes: Negative for visual disturbance.  Respiratory: Positive for cough and shortness of breath.   Cardiovascular: Negative for chest pain.  Gastrointestinal: Positive for nausea and vomiting. Negative for abdominal pain, constipation and diarrhea.  Genitourinary: Negative for dysuria, flank pain, frequency, pelvic pain, urgency, vaginal bleeding and vaginal discharge.  Neurological: Negative for dizziness, weakness, light-headedness and headaches.     Blood pressure (!) 94/45, pulse (!) 115, temperature 99.2 F (37.3 C), temperature source Oral, resp. rate (!) 22, height 5\' 3"  (1.6 m), weight 99.7 kg, last menstrual period 11/26/2019, SpO2 100 %.   Patient Vitals for the past 24 hrs:  BP Temp Temp src Pulse Resp SpO2 Height Weight  07/25/20 1719 -- -- -- -- -- 100 % -- --  07/25/20  1718 (!) 94/45 99.2 F (37.3 C) Oral (!) 115 (!) 22 -- -- --  07/25/20 1634 107/63 -- -- (!) 125 (!) 32 -- -- --  07/25/20 1629 -- -- -- -- -- 96 % -- --  07/25/20 1614 -- -- -- -- -- 99 % -- --  07/25/20 1557 (!) 95/57 (!) 101 F (38.3 C) Oral (!) 132 (!) 22 -- -- --  07/25/20 1544 -- -- -- -- -- 98 % -- --  07/25/20 1500 -- -- -- -- -- 100 % -- --  07/25/20 1443 104/67 (!) 100.9 F (38.3 C) Oral (!) 123 (!) 24 100 % -- --  07/25/20 1441 -- -- -- -- -- 98 % -- --  07/25/20 1430 -- -- -- -- -- -- 5\' 3"  (1.6 m) 99.7 kg   Physical Exam Vitals and nursing note reviewed.  Constitutional:      General: She is in acute distress.     Appearance: Normal appearance. She is obese. She is ill-appearing and toxic-appearing. She is not diaphoretic.  HENT:     Head: Normocephalic and atraumatic.  Pulmonary:  Effort: Tachypnea and respiratory distress present. No accessory muscle usage or retractions.     Breath sounds: Decreased air movement present. No wheezing, rhonchi or rales.  Abdominal:     Palpations: Abdomen is soft.  Skin:    General: Skin is warm and dry.  Neurological:     Mental Status: She is alert and oriented to person, place, and time.  Psychiatric:        Mood and Affect: Mood normal.        Behavior: Behavior normal.        Thought Content: Thought content normal.        Judgment: Judgment normal.   Prenatal labs: ABO, Rh: O/Positive/-- (03/18 1601) Antibody: Negative (06/22 0850) Rubella: 1.37 (03/18 1601) RPR: Non Reactive (06/22 0850)  HBsAg: Negative (03/18 1601)  HIV: Non Reactive (06/22 0850)  GBS:     Assessment/Plan:  1. Pneumonia due to COVID-19 virus   2. Supervision of high risk pregnancy, antepartum   3. Shortness of breath   4. Lab test positive for detection of COVID-19 virus   5. [redacted] weeks gestation of pregnancy   6. Fetal tachycardia affecting management of mother   7. Anemia affecting third pregnancy   8. Chronic hypertension affecting  pregnancy   9. S/P cesarean section   10. History of gestational hypertension   11. Irritable bowel syndrome with both constipation and diarrhea   12. NST (non-stress test) reactive     -admit to [facility] for COVID pneumonia with consultation services by hospitalist  Gerrie Nordmann Jye Fariss 07/25/2020, 5:33 PM

## 2020-07-25 NOTE — MAU Note (Signed)
Came in due to ongoing vomiting.  Unable to keep anything down.  Denies diarrhea.  Had temp first couple days, was taking Tylenol, unable to keep that down either.  Partner also +Covid.hurts all over. Feels kind of hot

## 2020-07-25 NOTE — MAU Provider Note (Signed)
History     CSN: [id number]  Arrival date and time: 07/25/20 1417   First Provider Initiated Contact with Patient 07/25/20 1502      Chief Complaint  Patient presents with  . Emesis   Melissa Livingston is a 24 y.o. G3P2002 at [redacted]w[redacted]d who presents to MAU for vomiting. Patient tested positive for COVID on Sunday 07/21/2020. Patient reports she went to get tested because she was having a sore throat and a cough and some headaches and body aches. Patient reports she went to Urgent Care and was tested for COVID and told she could take Tylenol. Since that time, her symptoms have waxed and waned, but then patient reports she woke up this morning around 7AM and took some Tylenol, laid down on the couch and about 26minutes later she started throwing up. Patient endorses vomiting x3 today. Patient reports she has only had ice to eat/drink these past 2 days and denies any food intake during that time. Patient reports she tried to eat a potato last night, but states that she vomiting about 5 minutes after eating. Pt denies intercourse in the past 24 hours. Patient endorses nausea, vomiting, sore throat, cough, "a little shortness of breath." Patient reports she was taken off of her blood pressure medications by her OB due to low BP since COVID diagnosis.  Pt denies VB, LOF, ctx, decreased FM, vaginal discharge/odor/itching. Pt denies abdominal pain, constipation, diarrhea, or urinary problems. Pt denies chills, fatigue, sweating or changes in appetite. Pt denies chest pain. Pt denies dizziness, HA, light-headedness, weakness.  Problems this pregnancy include: cHTN, previous C/S x2, anemia, COVID+, IBS, asthma, depression, GERD, sciatica, morbid obesity. Allergies? Adhesive, lorabid, latex, orange fruit Current medications/supplements? Patient reports she stopped taking all of her medications two days ago, except she took Zofran this morning at 7AM and Tylenol 1000mg  at 7AM, but reports she vomited about  82min after taking Tylenol and has not taken any additional medications since then. Other meds on the patient's med list include: bASA, fioricet, Flexeril, iron, labetalol, flonase, cepacol, prilosec, percocet, PNV, phenergan Prenatal care provider? [facility], next appt 07/26/2020   OB History    Gravida  3   Para  2   Term  2   Preterm      AB      Living  2     SAB      TAB      Ectopic      Multiple  0   Live Births  2           Past Medical History:  Diagnosis Date  . Anemia   . Anxiety   . Asthma    as child  . Endometritis 01/11/2015  . GERD (gastroesophageal reflux disease)   . Headache   . History of ovarian cyst 01/28/2015  . IBS (irritable bowel syndrome)   . Infection    UTI  . Nausea and vomiting 01/11/2015  . Ovarian cyst 01/14/2015  . Pelvic pain in female 01/11/2015  . Pregnancy induced hypertension    with pregnancy  . Pregnant 02/05/2016    Past Surgical History:  Procedure Laterality Date  . CESAREAN SECTION N/A 09/29/2014   Procedure: CESAREAN SECTION;  Surgeon: Melissa Kind, MD;  Location: [facility];  Service: Obstetrics;  Laterality: N/A;  . CESAREAN SECTION N/A 09/25/2016   Procedure: CESAREAN SECTION;  Surgeon: Melissa Kind, MD;  Location: [facility];  Service: Obstetrics;  Laterality: N/A;  .  CHOLECYSTECTOMY N/A 03/07/2018   Procedure: LAPAROSCOPIC CHOLECYSTECTOMY;  Surgeon: Aviva Signs, MD;  Location: [facility];  Service: General;  Laterality: N/A;  . COLONOSCOPY WITH PROPOFOL N/A 08/02/2017   Dr. Gala Romney: moderate internal hemorhoids, distal 5 cm of TI also appeared normal  . ESOPHAGOGASTRODUODENOSCOPY (EGD) WITH ESOPHAGEAL DILATION N/A 07/19/2013   Dr. Rourk:normal appearing esophagus s/p dilation, normal D1 and D2, unremarkable path   . ESOPHAGOGASTRODUODENOSCOPY (EGD) WITH PROPOFOL N/A 08/02/2017   Normal esophagus, small hiatal hernia, normal duodenum  . none    . WISDOM TOOTH EXTRACTION      Family History  Problem  Relation Age of Onset  . Multiple sclerosis Mother   . Heart disease Maternal Grandfather   . Heart disease Paternal Uncle   . Diabetes Paternal Uncle   . Hypertension Paternal Uncle   . Cancer Other        father's aunt had breast cancer  . Hypertension Father   . Colon cancer Other        maternal great uncle, age greater than 37  . Crohn's disease Other        couple of family members on father's side of family  . Hypertension Paternal Aunt     Social History   Tobacco Use  . Smoking status: Never Smoker  . Smokeless tobacco: Never Used  Vaping Use  . Vaping Use: Never used  Substance Use Topics  . Alcohol use: No    Alcohol/week: 0.0 standard drinks  . Drug use: No    Allergies:  Allergies  Allergen Reactions  . Adhesive [Tape] Other (See Comments)    Blisters   . Lorabid [Loracarbef] Hives, Swelling and Other (See Comments)    Mouth swelling Can take amoxil and augmentin   . Latex Itching and Rash  . Orange Fruit [Citrus] Rash    Medications Prior to Admission  Medication Sig Dispense Refill Last Dose  . acetaminophen (TYLENOL) 325 MG tablet Take 1,000 mg by mouth every 6 (six) hours as needed for mild pain, moderate pain or headache.    07/25/2020 at 0700  . aspirin EC 81 MG tablet Take 2 tablets (162 mg total) by mouth daily. 60 tablet 6 Past Week at Unknown time  . butalbital-acetaminophen-caffeine (FIORICET) 50-325-40 MG tablet Take 1 tablet by mouth every 4 (four) hours as needed for headache or migraine. 20 tablet 0 Past Week at Unknown time  . cyclobenzaprine (FLEXERIL) 10 MG tablet Take 1 tablet (10 mg total) by mouth every 8 (eight) hours as needed for muscle spasms. 30 tablet 0 Past Week at Unknown time  . fluticasone (FLONASE) 50 MCG/ACT nasal spray Place 1 spray into both nostrils daily for 14 days. 16 g 0 07/24/2020 at Unknown time  . labetalol (NORMODYNE) 200 MG tablet Take 1 tablet (200 mg total) by mouth 2 (two) times daily. (Patient taking  differently: Take 200 mg by mouth 3 (three) times daily. ) 60 tablet 3 Past Week at Unknown time  . menthol-cetylpyridinium (CEPACOL REGULAR STRENGTH) 3 MG lozenge Take 1 lozenge (3 mg total) by mouth as needed for sore throat. 100 tablet 12 07/25/2020 at Unknown time  . omeprazole (PRILOSEC) 20 MG capsule Take 1 capsule (20 mg total) by mouth daily. (Patient taking differently: Take 20 mg by mouth as needed. ) 30 capsule 6 Past Week at Unknown time  . ondansetron (ZOFRAN ODT) 4 MG disintegrating tablet Take 1 tablet (4 mg total) by mouth every 8 (eight) hours as needed for nausea  or vomiting. 30 tablet 1 07/25/2020 at Unknown time  . oxyCODONE-acetaminophen (PERCOCET/ROXICET) 5-325 MG tablet Take 1 tablet by mouth every 4 (four) hours as needed for severe pain. 20 tablet 0 Past Week at Unknown time  . Prenatal Vit-Fe Fumarate-FA (MULTIVITAMIN-PRENATAL) 27-0.8 MG TABS tablet Take 1 tablet by mouth daily at 12 noon.   Past Week at Unknown time  . promethazine (PHENERGAN) 25 MG tablet Take 1 tablet (25 mg total) by mouth every 6 (six) hours as needed for nausea or vomiting. 30 tablet 1 Past Week at Unknown time  . ferrous sulfate 325 (65 FE) MG tablet Take 1 tablet (325 mg total) by mouth 2 (two) times daily with a meal. 60 tablet 3     Review of Systems  Constitutional: Negative for chills, diaphoresis, fatigue and fever.  HENT: Positive for sore throat.   Eyes: Negative for visual disturbance.  Respiratory: Positive for cough and shortness of breath.   Cardiovascular: Negative for chest pain.  Gastrointestinal: Positive for nausea and vomiting. Negative for abdominal pain, constipation and diarrhea.  Genitourinary: Negative for dysuria, flank pain, frequency, pelvic pain, urgency, vaginal bleeding and vaginal discharge.  Neurological: Negative for dizziness, weakness, light-headedness and headaches.   Physical Exam   Blood pressure (!) 94/45, pulse (!) 115, temperature 99.2 F (37.3 C),  temperature source Oral, resp. rate (!) 22, height 5\' 3"  (1.6 m), weight 99.7 kg, last menstrual period 11/26/2019, SpO2 100 %.  Patient Vitals for the past 24 hrs:  BP Temp Temp src Pulse Resp SpO2 Height Weight  07/25/20 1719 -- -- -- -- -- 100 % -- --  07/25/20 1718 (!) 94/45 99.2 F (37.3 C) Oral (!) 115 (!) 22 -- -- --  07/25/20 1634 107/63 -- -- (!) 125 (!) 32 -- -- --  07/25/20 1629 -- -- -- -- -- 96 % -- --  07/25/20 1614 -- -- -- -- -- 99 % -- --  07/25/20 1557 (!) 95/57 (!) 101 F (38.3 C) Oral (!) 132 (!) 22 -- -- --  07/25/20 1544 -- -- -- -- -- 98 % -- --  07/25/20 1500 -- -- -- -- -- 100 % -- --  07/25/20 1443 104/67 (!) 100.9 F (38.3 C) Oral (!) 123 (!) 24 100 % -- --  07/25/20 1441 -- -- -- -- -- 98 % -- --  07/25/20 1430 -- -- -- -- -- -- 5\' 3"  (1.6 m) 99.7 kg   Physical Exam Vitals and nursing note reviewed.  Constitutional:      General: She is in acute distress.     Appearance: Normal appearance. She is obese. She is ill-appearing and toxic-appearing. She is not diaphoretic.  HENT:     Head: Normocephalic and atraumatic.  Pulmonary:     Effort: Tachypnea and respiratory distress present. No accessory muscle usage or retractions.     Breath sounds: Decreased air movement present. No wheezing, rhonchi or rales.  Abdominal:     Palpations: Abdomen is soft.  Skin:    General: Skin is warm and dry.  Neurological:     Mental Status: She is alert and oriented to person, place, and time.  Psychiatric:        Mood and Affect: Mood normal.        Behavior: Behavior normal.        Thought Content: Thought content normal.        Judgment: Judgment normal.    Results for orders placed or performed during the  hospital encounter of 07/25/20 (from the past 24 hour(s))  Urinalysis, Routine w reflex microscopic Urine, Clean Catch     Status: Abnormal   Collection Time: 07/25/20  2:32 PM  Result Value Ref Range   Color, Urine Melissa (A) YELLOW   APPearance HAZY (A)  CLEAR   Specific Gravity, Urine 1.035 (H) 1.005 - 1.030   pH 6.0 5.0 - 8.0   Glucose, UA NEGATIVE NEGATIVE mg/dL   Hgb urine dipstick NEGATIVE NEGATIVE   Bilirubin Urine SMALL (A) NEGATIVE   Ketones, ur 80 (A) NEGATIVE mg/dL   Protein, ur 100 (A) NEGATIVE mg/dL   Nitrite NEGATIVE NEGATIVE   Leukocytes,Ua SMALL (A) NEGATIVE   RBC / HPF 0-5 0 - 5 RBC/hpf   WBC, UA 11-20 0 - 5 WBC/hpf   Bacteria, UA FEW (A) NONE SEEN   Squamous Epithelial / LPF 6-10 0 - 5   Mucus PRESENT   CBC with Differential/Platelet     Status: Abnormal   Collection Time: 07/25/20  3:17 PM  Result Value Ref Range   WBC 7.8 4.0 - 10.5 K/uL   RBC 4.65 3.87 - 5.11 MIL/uL   Hemoglobin 9.6 (L) 12.0 - 15.0 g/dL   HCT 32.0 (L) 36 - 46 %   MCV 68.8 (L) 80.0 - 100.0 fL   MCH 20.6 (L) 26.0 - 34.0 pg   MCHC 30.0 30.0 - 36.0 g/dL   RDW 17.0 (H) 11.5 - 15.5 %   Platelets 227 150 - 400 K/uL   nRBC 0.4 (H) 0.0 - 0.2 %   Neutrophils Relative % 97 %   Neutro Abs 7.6 1.7 - 7.7 K/uL   Lymphocytes Relative 3 %   Lymphs Abs 0.2 (L) 0.7 - 4.0 K/uL   Monocytes Relative 0 %   Monocytes Absolute 0.0 (L) 0 - 1 K/uL   Eosinophils Relative 0 %   Eosinophils Absolute 0.0 0 - 0 K/uL   Basophils Relative 0 %   Basophils Absolute 0.0 0 - 0 K/uL   WBC Morphology See Note    nRBC 0 0 /100 WBC   Abs Immature Granulocytes 0.00 0.00 - 0.07 K/uL  Comprehensive metabolic panel     Status: Abnormal   Collection Time: 07/25/20  3:17 PM  Result Value Ref Range   Sodium 133 (L) 135 - 145 mmol/L   Potassium 3.3 (L) 3.5 - 5.1 mmol/L   Chloride 101 98 - 111 mmol/L   CO2 20 (L) 22 - 32 mmol/L   Glucose, Bld 86 70 - 99 mg/dL   BUN 5 (L) 6 - 20 mg/dL   Creatinine, Ser 0.74 0.44 - 1.00 mg/dL   Calcium 7.9 (L) 8.9 - 10.3 mg/dL   Total Protein 5.9 (L) 6.5 - 8.1 g/dL   Albumin 2.7 (L) 3.5 - 5.0 g/dL   AST 76 (H) 15 - 41 U/L   ALT 37 0 - 44 U/L   Alkaline Phosphatase 175 (H) 38 - 126 U/L   Total Bilirubin 0.9 0.3 - 1.2 mg/dL   GFR calc non Af  Amer >60 >60 mL/min   GFR calc Af Amer >60 >60 mL/min   Anion gap 12 5 - 15  Protein / creatinine ratio, urine     Status: Abnormal   Collection Time: 07/25/20  3:17 PM  Result Value Ref Range   Creatinine, Urine 343.68 mg/dL   Total Protein, Urine 109 mg/dL   Protein Creatinine Ratio 0.32 (H) 0.00 - 0.15 mg/mg[Cre]  Platelet count  Status: None   Collection Time: 07/25/20  4:51 PM  Result Value Ref Range   Platelets 242 150 - 400 K/uL   US OB Follow Up  Result Date: 07/14/2020 FOLLOW UP SONOGRAM Melissa KEMONI QUESENBERRY is in the office for a follow up sonogram for EFW,BPP and cord dopplers. She is a 24 y.o. year old G76P2002 with Estimated Date of Delivery: 09/01/20 by LMP now at  [redacted]w[redacted]d weeks gestation. Thus far the pregnancy has been complicated by [facility]. GESTATION: SINGLETON PRESENTATION: cephalic FETAL ACTIVITY:          Heart rate         142          The fetus is active. AMNIOTIC FLUID: The amniotic fluid volume is  normal, 12.3 cm. PLACENTA LOCALIZATION:  posterior GRADE 0 CERVIX: Limited view GESTATIONAL AGE AND  BIOMETRICS: Gestational criteria: Estimated Date of Delivery: 09/01/20 by LMP now at [redacted]w[redacted]d Previous Scans:4          BIPARIETAL DIAMETER           7.66 cm         30+5 weeks HEAD CIRCUMFERENCE           28.20 cm         30+6 weeks ABDOMINAL CIRCUMFERENCE           28.90 cm         32+6 weeks FEMUR LENGTH           6.01 cm         31+1 weeks                                                       AVERAGE EGA(BY THIS SCAN):  31+2 weeks                                                 ESTIMATED FETAL WEIGHT:       1887  grams, 58 % . BIOPHYSICAL PROFILE:                                                                                                      COMMENTS GROSS BODY MOVEMENT                 2  TONE                2  RESPIRATIONS                2  AMNIOTIC FLUID                2  SCORE:  8/8 (Note: NST was not performed as part of this  antepartum testing) DOPPLER FLOW STUDIES: UMBILICAL ARTERY RI RATIOS:   .63,.70,.58,.57=35% ANATOMICAL SURVEY                                                                            COMMENTS CEREBRAL VENTRICLES yes normal  CHOROID PLEXUS yes normal  CEREBELLUM yes normal  CISTERNA MAGNA yes normal              NOSE/LIP yes normal  FACIAL PROFILE yes normal  4 CHAMBERED HEART yes normal  OUTFLOW TRACTS yes normal  DIAPHRAGM yes normal  STOMACH yes normal  RENAL REGION yes normal  BLADDER yes normal      3 VESSEL CORD yes normal              GENITALIA yes normal female     SUSPECTED ABNORMALITIES:  no QUALITY OF SCAN: satisfactory TECHNICIAN COMMENTS: [place] 27+0 wks,cephalic,fhr 350 bpm,posterior placenta gr 0,afi 12.3 cm.RI .63,.70,.58,.57=35%,BPP 8/8,EFW 1887 G 58% A copy of this report including all images has been saved and backed up to a second source for retrieval if needed. All measures and details of the anatomical scan, placentation, fluid volume and pelvic anatomy are contained in that report. Melissa Livingston 07/03/2020 10:27 AM Clinical Impression and recommendations: I have reviewed the sonogram results above, combined with the patient's current clinical course, below are my impressions and any appropriate recommendations for management based on the sonographic findings. 1.  [id number] Estimated Date of Delivery: 09/01/20 by  LMP, midtrimester ultrasound and confirmed by today's sonographic dating 2.  Normal fetal sonographic findings, specifically prior normal detailed anatomical evaluation,      no   New abnormalities noted 3.  Normal general sonographic findings including weight at 58%ile, and specifically reassuring fetal assessment with good BPP, 8/8 ,fluid and excellent Doppler flow ,RI  35% Recommend routine prenatal care based on this sonogram or as clinically indicated Melissa Livingston 07/14/2020 6:38 AM   US Fetal BPP W/O Non Stress  Result Date: 07/14/2020 FOLLOW UP SONOGRAM Melissa Livingston is in the office  for a follow up sonogram for EFW,BPP and cord dopplers. She is a 24 y.o. year old G56P2002 with Estimated Date of Delivery: 09/01/20 by LMP now at  [redacted]w[redacted]d weeks gestation. Thus far the pregnancy has been complicated by [facility]. GESTATION: SINGLETON PRESENTATION: cephalic FETAL ACTIVITY:          Heart rate         142          The fetus is active. AMNIOTIC FLUID: The amniotic fluid volume is  normal, 12.3 cm. PLACENTA LOCALIZATION:  posterior GRADE 0 CERVIX: Limited view GESTATIONAL AGE AND  BIOMETRICS: Gestational criteria: Estimated Date of Delivery: 09/01/20 by LMP now at [redacted]w[redacted]d Previous Scans:4          BIPARIETAL DIAMETER           7.66 cm         30+5 weeks HEAD CIRCUMFERENCE           28.20 cm         30+6 weeks ABDOMINAL CIRCUMFERENCE  28.90 cm         32+6 weeks FEMUR LENGTH           6.01 cm         31+1 weeks                                                       AVERAGE EGA(BY THIS SCAN):  31+2 weeks                                                 ESTIMATED FETAL WEIGHT:       1887  grams, 58 % . BIOPHYSICAL PROFILE:                                                                                                      COMMENTS GROSS BODY MOVEMENT                 2  TONE                2  RESPIRATIONS                2  AMNIOTIC FLUID                2                                                          SCORE:  8/8 (Note: NST was not performed as part of this antepartum testing) DOPPLER FLOW STUDIES: UMBILICAL ARTERY RI RATIOS:   .63,.70,.58,.57=35% ANATOMICAL SURVEY                                                                            COMMENTS CEREBRAL VENTRICLES yes normal  CHOROID PLEXUS yes normal  CEREBELLUM yes normal  CISTERNA MAGNA yes normal              NOSE/LIP yes normal  FACIAL PROFILE yes normal  4 CHAMBERED HEART yes normal  OUTFLOW TRACTS yes normal  DIAPHRAGM yes normal  STOMACH yes normal  RENAL REGION yes normal  BLADDER yes normal      3 VESSEL CORD yes normal               GENITALIA yes normal female     SUSPECTED ABNORMALITIES:  no QUALITY OF SCAN: satisfactory TECHNICIAN COMMENTS: [place] 16+1 wks,cephalic,fhr 096 bpm,posterior placenta gr 0,afi 12.3 cm.RI .63,.70,.58,.57=35%,BPP 8/8,EFW 1887 G 58% A copy of this report including all images has been saved and backed up to a second source for retrieval if needed. All measures and details of the anatomical scan, placentation, fluid volume and pelvic anatomy are contained in that report. Melissa Livingston 07/03/2020 10:27 AM Clinical Impression and recommendations: I have reviewed the sonogram results above, combined with the patient's current clinical course, below are my impressions and any appropriate recommendations for management based on the sonographic findings. 1.  [id number] Estimated Date of Delivery: 09/01/20 by  LMP, midtrimester ultrasound and confirmed by today's sonographic dating 2.  Normal fetal sonographic findings, specifically prior normal detailed anatomical evaluation,      no   New abnormalities noted 3.  Normal general sonographic findings including weight at 58%ile, and specifically reassuring fetal assessment with good BPP, 8/8 ,fluid and excellent Doppler flow ,RI  35% Recommend routine prenatal care based on this sonogram or as clinically indicated Melissa Livingston 07/14/2020 6:38 AM   US Fetal BPP W/O Non Stress  Result Date: 07/09/2020 FOLLOW UP SONOGRAM Melissa Livingston is in the office for a follow up sonogram for BPP and cord dopplers. She is a 24 y.o. year old G83P2002 with Estimated Date of Delivery: 09/01/20 by LMP now at  [redacted]w[redacted]d weeks gestation. Thus far the pregnancy has been complicated by [facility]. GESTATION: SINGLETON PRESENTATION: cephalic FETAL ACTIVITY:          Heart rate         124          The fetus is active. AMNIOTIC FLUID: The amniotic fluid volume is  normal, 13 cm. PLACENTA LOCALIZATION:  posterior GRADE 0 CERVIX: Limited view Previous Scans:5 BIOPHYSICAL PROFILE:                                                                                                       COMMENTS GROSS BODY MOVEMENT                 2  TONE                2  RESPIRATIONS                2  AMNIOTIC FLUID                2                                                          SCORE:  8/8 (Note: NST was not performed as part of this antepartum testing) DOPPLER FLOW STUDIES: UMBILICAL ARTERY RI RATIOS:   .55,.62,.52,.58=35%  ANATOMICAL SURVEY                                                                            COMMENTS CEREBRAL VENTRICLES yes normal  CHOROID PLEXUS yes normal  CEREBELLUM yes normal  CISTERNA MAGNA yes normal                  FACIAL PROFILE yes normal  4 CHAMBERED HEART yes normal  OUTFLOW TRACTS yes normal  DIAPHRAGM yes normal  STOMACH yes normal  RENAL REGION yes normal  BLADDER yes normal  CORD INSERTION yes normal  3 VESSEL CORD yes normal              GENITALIA yes normal female     SUSPECTED ABNORMALITIES:  no QUALITY OF SCAN: satisfactory TECHNICIAN COMMENTS: [place] 32+2 wks.cephalic,BPP 7/1,IRC 789 BPM,posterior placenta gr 0,AFI 13 cm.RI .55,.62,.52,.58=35% A copy of this report including all images has been saved and backed up to a second source for retrieval if needed. All measures and details of the anatomical scan, placentation, fluid volume and pelvic anatomy are contained in that report. Melissa Livingston 07/09/2020 12:24 PM Clinical Impression and recommendations: I have reviewed the sonogram results above, combined with the patient's current clinical course, below are my impressions and any appropriate recommendations for management based on the sonographic findings. 1.  [id number] Estimated Date of Delivery: 09/01/20 by serial sonographic evaluations 2.  Fetal sonographic surveillance findings: a). Normal fluid volume b). Normal antepartum fetal assessment with BPP 8/8 c). Normal fetal Doppler ratios with consistent diastolic flow 10% 3.  Normal general sonographic findings  Recommend continued prenatal evaluations and care based on this sonogram and as clinically indicated from the patient's clinical course. Melissa Livingston 07/09/2020 2:29 PM   [place] UA Cord Doppler  Result Date: 07/14/2020 FOLLOW UP SONOGRAM Melissa Livingston is in the office for a follow up sonogram for EFW,BPP and cord dopplers. She is a 24 y.o. year old G33P2002 with Estimated Date of Delivery: 09/01/20 by LMP now at  [redacted]w[redacted]d weeks gestation. Thus far the pregnancy has been complicated by [facility]. GESTATION: SINGLETON PRESENTATION: cephalic FETAL ACTIVITY:          Heart rate         142          The fetus is active. AMNIOTIC FLUID: The amniotic fluid volume is  normal, 12.3 cm. PLACENTA LOCALIZATION:  posterior GRADE 0 CERVIX: Limited view GESTATIONAL AGE AND  BIOMETRICS: Gestational criteria: Estimated Date of Delivery: 09/01/20 by LMP now at [redacted]w[redacted]d Previous Scans:4          BIPARIETAL DIAMETER           7.66 cm         30+5 weeks HEAD CIRCUMFERENCE           28.20 cm         30+6 weeks ABDOMINAL CIRCUMFERENCE           28.90 cm         32+6 weeks FEMUR LENGTH           6.01 cm         31+1 weeks  AVERAGE EGA(BY THIS SCAN):  31+2 weeks                                                 ESTIMATED FETAL WEIGHT:       1887  grams, 58 % . BIOPHYSICAL PROFILE:                                                                                                      COMMENTS GROSS BODY MOVEMENT                 2  TONE                2  RESPIRATIONS                2  AMNIOTIC FLUID                2                                                          SCORE:  8/8 (Note: NST was not performed as part of this antepartum testing) DOPPLER FLOW STUDIES: UMBILICAL ARTERY RI RATIOS:   .63,.70,.58,.57=35% ANATOMICAL SURVEY                                                                            COMMENTS CEREBRAL VENTRICLES yes normal  CHOROID PLEXUS yes normal  CEREBELLUM yes normal  CISTERNA MAGNA  yes normal              NOSE/LIP yes normal  FACIAL PROFILE yes normal  4 CHAMBERED HEART yes normal  OUTFLOW TRACTS yes normal  DIAPHRAGM yes normal  STOMACH yes normal  RENAL REGION yes normal  BLADDER yes normal      3 VESSEL CORD yes normal              GENITALIA yes normal female     SUSPECTED ABNORMALITIES:  no QUALITY OF SCAN: satisfactory TECHNICIAN COMMENTS: [place] 45+4 wks,cephalic,fhr 098 bpm,posterior placenta gr 0,afi 12.3 cm.RI .63,.70,.58,.57=35%,BPP 8/8,EFW 1887 G 58% A copy of this report including all images has been saved and backed up to a second source for retrieval if needed. All measures and details of the anatomical scan, placentation, fluid volume and pelvic anatomy are contained in that report. Melissa Livingston 07/03/2020 10:27 AM Clinical Impression and recommendations: I have reviewed the sonogram results above, combined with the patient's current clinical course, below are my  impressions and any appropriate recommendations for management based on the sonographic findings. 1.  [id number] Estimated Date of Delivery: 09/01/20 by  LMP, midtrimester ultrasound and confirmed by today's sonographic dating 2.  Normal fetal sonographic findings, specifically prior normal detailed anatomical evaluation,      no   New abnormalities noted 3.  Normal general sonographic findings including weight at 58%ile, and specifically reassuring fetal assessment with good BPP, 8/8 ,fluid and excellent Doppler flow ,RI  35% Recommend routine prenatal care based on this sonogram or as clinically indicated Melissa Livingston 07/14/2020 6:38 AM   [place] UA Cord Doppler  Result Date: 07/09/2020 FOLLOW UP SONOGRAM Melissa TASHAWNA THOM is in the office for a follow up sonogram for BPP and cord dopplers. She is a 24 y.o. year old [id number] with Estimated Date of Delivery: 09/01/20 by LMP now at  [redacted]w[redacted]d weeks gestation. Thus far the pregnancy has been complicated by [facility]. GESTATION: SINGLETON PRESENTATION: cephalic FETAL ACTIVITY:          Heart rate          124          The fetus is active. AMNIOTIC FLUID: The amniotic fluid volume is  normal, 13 cm. PLACENTA LOCALIZATION:  posterior GRADE 0 CERVIX: Limited view Previous Scans:5 BIOPHYSICAL PROFILE:                                                                                                      COMMENTS GROSS BODY MOVEMENT                 2  TONE                2  RESPIRATIONS                2  AMNIOTIC FLUID                2                                                          SCORE:  8/8 (Note: NST was not performed as part of this antepartum testing) DOPPLER FLOW STUDIES: UMBILICAL ARTERY RI RATIOS:   .55,.62,.52,.58=35%                                            ANATOMICAL SURVEY  COMMENTS CEREBRAL VENTRICLES yes normal  CHOROID PLEXUS yes normal  CEREBELLUM yes normal  CISTERNA MAGNA yes normal                  FACIAL PROFILE yes normal  4 CHAMBERED HEART yes normal  OUTFLOW TRACTS yes normal  DIAPHRAGM yes normal  STOMACH yes normal  RENAL REGION yes normal  BLADDER yes normal  CORD INSERTION yes normal  3 VESSEL CORD yes normal              GENITALIA yes normal female     SUSPECTED ABNORMALITIES:  no QUALITY OF SCAN: satisfactory TECHNICIAN COMMENTS: [place] 32+2 wks.cephalic,BPP 4/3,XVQ 008 BPM,posterior placenta gr 0,AFI 13 cm.RI .55,.62,.52,.58=35% A copy of this report including all images has been saved and backed up to a second source for retrieval if needed. All measures and details of the anatomical scan, placentation, fluid volume and pelvic anatomy are contained in that report. Melissa Livingston 07/09/2020 12:24 PM Clinical Impression and recommendations: I have reviewed the sonogram results above, combined with the patient's current clinical course, below are my impressions and any appropriate recommendations for management based on the sonographic findings. 1.  [id number] Estimated Date of Delivery: 09/01/20 by serial sonographic  evaluations 2.  Fetal sonographic surveillance findings: a). Normal fluid volume b). Normal antepartum fetal assessment with BPP 8/8 c). Normal fetal Doppler ratios with consistent diastolic flow 93% 3.  Normal general sonographic findings Recommend continued prenatal evaluations and care based on this sonogram and as clinically indicated from the patient's clinical course. Melissa Livingston 07/09/2020 2:29 PM   DG CHEST PORT 1 VIEW  Result Date: 07/25/2020 CLINICAL DATA:  Short of breath, COVID-19 positive, pregnant EXAM: PORTABLE CHEST 1 VIEW COMPARISON:  01/29/2019 FINDINGS: Single frontal view of the chest demonstrates a stable cardiac silhouette. There is patchy bilateral ground-glass airspace disease consistent with multifocal pneumonia given COVID-19 positivity. Mild central vascular congestion likely due to gravid state. No effusion or pneumothorax. No acute bony abnormalities. IMPRESSION: 1. Multifocal bilateral ground-glass airspace disease consistent with pneumonia. Pattern is consistent with COVID-19. Electronically Signed   By: Melissa Ngo M.D.   On: 07/25/2020 16:15    MAU Course  Procedures  MDM -known COVID+, presents to MAU with worsening COVID symptoms including SOB with tachypnea, fever of 101 at highest, tachycardia, slightly falling O2 with COVID pneumonia shown on CXR and decreased air flow throughout lungs on auscultation -consulted with Dr. Rip Harbour early in course of care, who recommends consulting with hospitalist -consulted with hospitalist on-call who comes to bedside and recommends admission -Dr. Rip Harbour or Dr. Elgie Congo to enter admission orders for patient to be transferred to [facility] for admission -CBC, CMP, PCr, sepsis and DIC labs ordered -IV Tylenol, Zofran, Pepcid, LR bolus -EFM: reactive with tachycardia       -baseline: initially 170, with decreasing to 150 after acetaminophen       -variability: moderate       -accels: present, 15x15       -decels: absent        -TOCO: irritability, quiet after fluids -admit to [facility]  Orders Placed This Encounter  Procedures  . Culture, blood (routine x 2)    Standing Status:   Standing    Number of Occurrences:   2  . DG CHEST PORT 1 VIEW    Standing Status:   Standing    Number of Occurrences:   1    Order Specific Question:  Symptom/Reason for Exam    Answer:   Shortness of breath [786.05.ICD-9-CM]    Order Specific Question:   Symptom/Reason for Exam    Answer:   Lab test positive for detection of COVID-19 virus [[id number]]    Order Specific Question:   Radiology Contrast Protocol - do NOT remove file path    Answer:   [email or web address]  . Urinalysis, Routine w reflex microscopic Urine, Clean Catch    Standing Status:   Standing    Number of Occurrences:   1  . CBC with Differential/Platelet    Standing Status:   Standing    Number of Occurrences:   1  . Comprehensive metabolic panel    Standing Status:   Standing    Number of Occurrences:   1  . Protein / creatinine ratio, urine    Standing Status:   Standing    Number of Occurrences:   1  . Lactic acid, plasma    Standing Status:   Standing    Number of Occurrences:   2  . Protime-INR    Standing Status:   Standing    Number of Occurrences:   1  . APTT    Standing Status:   Standing    Number of Occurrences:   1  . Fibrinogen    Standing Status:   Standing    Number of Occurrences:   1  . Platelet count    Standing Status:   Standing    Number of Occurrences:   1  . Save Smear    Standing Status:   Standing    Number of Occurrences:   1  . Brain natriuretic peptide    Standing Status:   Standing    Number of Occurrences:   1  . C-reactive protein    Standing Status:   Standing    Number of Occurrences:   1  . Ferritin    Standing Status:   Standing    Number of Occurrences:   1  . Fibrinogen    Standing Status:   Standing    Number of Occurrences:   1  . Hepatitis B surface  antigen    Standing Status:   Standing    Number of Occurrences:   1  . Lactate dehydrogenase    Standing Status:   Standing    Number of Occurrences:   1  . Procalcitonin    Standing Status:   Standing    Number of Occurrences:   1  . CBC with Differential/Platelet    Standing Status:   Standing    Number of Occurrences:   5  . Comprehensive metabolic panel    Standing Status:   Standing    Number of Occurrences:   5  . C-reactive protein    Standing Status:   Standing    Number of Occurrences:   5  . D-dimer, quantitative (not at [facility])    Standing Status:   Standing    Number of Occurrences:   5  . Ferritin    Standing Status:   Standing    Number of Occurrences:   5  . Magnesium    Standing Status:   Standing    Number of Occurrences:   5  . Phosphorus    Standing Status:   Standing    Number of Occurrences:   5  . Diet clear liquid Room service appropriate? Yes; Fluid consistency: Thin    Standing Status:   Standing  Number of Occurrences:   1    Order Specific Question:   Room service appropriate?    Answer:   Yes    Order Specific Question:   Fluid consistency:    Answer:   Thin  . Vital signs    Standing Status:   Standing    Number of Occurrences:   1  . Notify physician    Standing Status:   Standing    Number of Occurrences:   20    Order Specific Question:   Notify Physician    Answer:   for respiratory distress, altered mental status, worsening SpO2 or increases in oxygen supplementation >4LPM [place] or if oxygen needs double in less than 2 hours    Order Specific Question:   Notify Physician    Answer:   for pulse less than 55 or greater than 120    Order Specific Question:   Notify Physician    Answer:   for respiratory rate less than 12 or greater than 25    Order Specific Question:   Notify Physician    Answer:   for temperature greater than 100.5 F    Order Specific Question:   Notify Physician    Answer:   for urinary output less than 30 mL/hr for  four hours    Order Specific Question:   Notify Physician    Answer:   for systolic BP less than 90 or greater than 993, diastolic BP less than 60 or greater than 100  . Novel Coronavirus PPE supplies (droplet and contact precautions) yellow stethoscopes, surgical mask, gowns, surgical caps, face shield, goggles, CAPR - on the floor/unit, cleaning [employer] (orange and purple top)    Standing Status:   Standing    Number of Occurrences:   1  . Place COVID-19 isolation sign and PPE checklist outside the DOOR    Standing Status:   Standing    Number of Occurrences:   1  . Do not give nonsteroidal anti-inflammatory drugs (NSAIDs)    Standing Status:   Standing    Number of Occurrences:   1  . Patient to wear surgical mask during transportation    Standing Status:   Standing    Number of Occurrences:   1  . Place working phone next to the patient    Standing Status:   Standing    Number of Occurrences:   1  . Assess patient for ability to self-prone. If able (can move self in bed, ambulate) and stable (SpO2 and oxygen requirement):    Educate patient on self-prone technique and rationale  Instruct patient to: Prone for 2-3 hours every 12 hours x 2 days    Standing Status:   Standing    Number of Occurrences:   1  . Initiate Oral Care Protocol    Standing Status:   Standing    Number of Occurrences:   1  . Initiate Carrier Fluid Protocol    Standing Status:   Standing    Number of Occurrences:   1  . Up with assistance    Standing Status:   Standing    Number of Occurrences:   [id number]  . remdesivir per pharmacy consult    Standing Status:   Standing    Number of Occurrences:   1    Order Specific Question:   Indication:    Answer:   COVID positive, requiring supplemental oxygen, evidence of lower respiratory infection on chest imaging  . Airborne and  Contact precautions    Standing Status:   Standing    Number of Occurrences:   1  . Incentive spirometry    Standing Status:    Standing    Number of Occurrences:   1  . Flutter valve    Standing Status:   Standing    Number of Occurrences:   1  . Pulse oximetry check with vital signs    Standing Status:   Standing    Number of Occurrences:   1  . ABO/Rh    Standing Status:   Standing    Number of Occurrences:   1  . Insert peripheral IV    Standing Status:   Standing    Number of Occurrences:   1   Meds ordered this encounter  Medications  . lactated ringers bolus 1,000 mL  . DISCONTD: acetaminophen (OFIRMEV) IV 1,000 mg    Order Specific Question:   Is the patient UNABLE to take oral / enteral medications?    Answer:   Yes    Order Specific Question:   Does the patient have a contraindication to NSAID use? (If YES, specify on line 3)    Answer:   Yes    Order Specific Question:   NSAID contraindication:    Answer:   Pregnancy    Order Specific Question:   Answers to IV acetaminophen criteria above:    Answer:   "Yes" to all criteria.  Marland Kitchen ondansetron (ZOFRAN) 8 mg in sodium chloride 0.9 % 50 mL IVPB  . famotidine (PEPCID) IVPB 20 mg premix  . acetaminophen (OFIRMEV) IV 1,000 mg    Order Specific Question:   Is the patient UNABLE to take oral / enteral medications?    Answer:   Yes    Order Specific Question:   Does the patient have a contraindication to NSAID use? (If YES, specify on line 3)    Answer:   Yes    Order Specific Question:   NSAID contraindication:    Answer:   Pregnant    Order Specific Question:   Answers to IV acetaminophen criteria above:    Answer:   "Yes" to all criteria.  Marland Kitchen DISCONTD: acetaminophen (TYLENOL) suppository 650 mg  . FOLLOWED BY Linked Order Group   . remdesivir 200 mg in sodium chloride 0.9% 250 mL IVPB   . remdesivir 100 mg in sodium chloride 0.9 % 100 mL IVPB  . guaiFENesin-dextromethorphan (ROBITUSSIN DM) 100-10 MG/5ML syrup 10 mL  . chlorpheniramine-HYDROcodone (TUSSIONEX) 10-8 MG/5ML suspension 5 mL  . ascorbic acid (VITAMIN C) tablet 500 mg  . zinc sulfate  capsule 220 mg  . methylPREDNISolone sodium succinate (SOLU-MEDROL) 40 mg/mL injection 24.8 mg    Assessment and Plan   1. Pneumonia due to COVID-19 virus   2. Supervision of high risk pregnancy, antepartum   3. Shortness of breath   4. Lab test positive for detection of COVID-19 virus   5. [redacted] weeks gestation of pregnancy   6. Fetal tachycardia affecting management of mother   7. Anemia affecting third pregnancy   8. Chronic hypertension affecting pregnancy   9. S/P cesarean section   10. History of gestational hypertension   11. Irritable bowel syndrome with both constipation and diarrhea   12. NST (non-stress test) reactive    -admit to [facility] for COVID pneumonia, management in conjunction with hospitalist  Melissa Livingston 07/25/2020, 5:27 PM

## 2020-07-25 NOTE — Progress Notes (Signed)
Pt is complaining of back pain. Dr. Elgie Congo gave verbal order for Tylenol 500mg  q4 hours.   Verbal orders also given to discontinue continuous monitoring. Daily NST and qshift heart tones ordered.

## 2020-07-25 NOTE — Consult Note (Signed)
Medical Consultation   Melissa Livingston  AYT:[id number]  DOB: 31-Dec-1995  DOA: 07/25/2020  PCP: Kathyrn Drown, MD   Requesting physician: Elmyra Ricks NP  Reason for consultation: Covid pneumonia  History of Present Illness: Melissa Livingston is an 24 y.o. female G3, P2, 34.[redacted] weeks pregnant presented with worsening shortness of breath and cough since 5 days.  Patient tells me that she tested positive for COVID-19 on 07/21/2020 since then she has sore throat, weakness shortness of breath, cough, congestion, body ache, generalized weakness, lethargic, decreased appetite. Reports that this morning she started having vomiting x3, nonbloody, could not keep anything down, reports fever, foul-smelling and dark-colored urine however denies lower abdominal pain, chest pain, leg swelling, orthopnea, PND, recent travel, immobilization, previous history of DVT/PE.  Her boyfriend recently tested positive for COVID-19.  Her blood pressure was noted to be low recently therefore her labetalol was discontinued by her OB.  Reports good fetal movements, denies vaginal discharge or bleeding. No history of smoking, alcohol, illicit drug use during pregnancy. She is compliant with her prenatal vitamins.  Past Medical History:  Diagnosis Date  . Anemia   . Anxiety   . Asthma    as child  . Endometritis 01/11/2015  . GERD (gastroesophageal reflux disease)   . Headache   . History of ovarian cyst 01/28/2015  . IBS (irritable bowel syndrome)   . Infection    UTI  . Nausea and vomiting 01/11/2015  . Ovarian cyst 01/14/2015  . Pelvic pain in female 01/11/2015  . Pregnancy induced hypertension    with pregnancy  . Pregnant 02/05/2016         Review of Systems:  ROS As per HPI otherwise 10 point review of systems negative.     Past Medical History: Past Medical History:  Diagnosis Date  . Anemia   . Anxiety   . Asthma    as child  . Endometritis 01/11/2015  . GERD (gastroesophageal reflux disease)    . Headache   . History of ovarian cyst 01/28/2015  . IBS (irritable bowel syndrome)   . Infection    UTI  . Nausea and vomiting 01/11/2015  . Ovarian cyst 01/14/2015  . Pelvic pain in female 01/11/2015  . Pregnancy induced hypertension    with pregnancy  . Pregnant 02/05/2016    Past Surgical History: Past Surgical History:  Procedure Laterality Date  . CESAREAN SECTION N/A 09/29/2014   Procedure: CESAREAN SECTION;  Surgeon: Jonnie Kind, MD;  Location: [facility];  Service: Obstetrics;  Laterality: N/A;  . CESAREAN SECTION N/A 09/25/2016   Procedure: CESAREAN SECTION;  Surgeon: Jonnie Kind, MD;  Location: [facility];  Service: Obstetrics;  Laterality: N/A;  . CHOLECYSTECTOMY N/A 03/07/2018   Procedure: LAPAROSCOPIC CHOLECYSTECTOMY;  Surgeon: Aviva Signs, MD;  Location: [facility];  Service: General;  Laterality: N/A;  . COLONOSCOPY WITH PROPOFOL N/A 08/02/2017   Dr. Gala Romney: moderate internal hemorhoids, distal 5 cm of TI also appeared normal  . ESOPHAGOGASTRODUODENOSCOPY (EGD) WITH ESOPHAGEAL DILATION N/A 07/19/2013   Dr. Rourk:normal appearing esophagus s/p dilation, normal D1 and D2, unremarkable path   . ESOPHAGOGASTRODUODENOSCOPY (EGD) WITH PROPOFOL N/A 08/02/2017   Normal esophagus, small hiatal hernia, normal duodenum  . none    . WISDOM TOOTH EXTRACTION       Allergies:   Allergies  Allergen Reactions  . Adhesive [Tape] Other (See Comments)  Blisters   . Lorabid [Loracarbef] Hives, Swelling and Other (See Comments)    Mouth swelling Can take amoxil and augmentin   . Latex Itching and Rash  . Orange Fruit [Citrus] Rash     Social History:  reports that she has never smoked. She has never used smokeless tobacco. She reports that she does not drink alcohol and does not use drugs.   Family History: Family History  Problem Relation Age of Onset  . Multiple sclerosis Mother   . Heart disease Maternal Grandfather   . Heart disease Paternal Uncle   . Diabetes  Paternal Uncle   . Hypertension Paternal Uncle   . Cancer Other        father's aunt had breast cancer  . Hypertension Father   . Colon cancer Other        maternal great uncle, age greater than 55  . Crohn's disease Other        couple of family members on father's side of family  . Hypertension Paternal Aunt       Physical Exam: Vitals:   07/25/20 1629 07/25/20 1634 07/25/20 1718 07/25/20 1719  BP:  107/63 (!) 94/45   Pulse:  (!) 125 (!) 115   Resp:  (!) 32 (!) 22   Temp:   99.2 F (37.3 C)   TempSrc:   Oral   SpO2: 96%   100%  Weight:      Height:        Constitutional: Appearance,  Alert and awake, oriented x3, not in any acute distress. Eyes: PERLA, EOMI, irises appear normal, anicteric sclera,  ENMT: external ears and nose appear normal, normal hearing or hard of hearing            Lips appears normal, oropharynx mucosa, tongue, posterior pharynx appear normal  Neck: neck appears normal, no masses, normal ROM, no thyromegaly, no JVD  CVS: S1-S2 clear, no murmur rubs or gallops, no LE edema, normal pedal pulses  Respiratory:  clear to auscultation bilaterally, no wheezing, rales or rhonchi. Respiratory effort normal. No accessory muscle use.  Abdomen: soft nontender, nondistended, normal bowel sounds, no hepatosplenomegaly, no hernias  Musculoskeletal: : no cyanosis, clubbing or edema noted bilaterally                       Joint/bones/muscle exam, strength, contractures or atrophy Neuro: Cranial nerves II-XII intact, strength, sensation, reflexes Psych: judgement and insight appear normal, stable mood and affect, mental status Skin: no rashes or lesions or ulcers, no induration or nodules    Data reviewed:  I have personally reviewed following labs and imaging studies Labs:  CBC: Recent Labs  Lab 07/25/20 1517 07/25/20 1651  WBC 7.8  --   NEUTROABS 7.6  --   HGB 9.6*  --   HCT 32.0*  --   MCV 68.8*  --   PLT 227 403    Basic Metabolic Panel: Recent Labs   Lab 07/25/20 1517  NA 133*  K 3.3*  CL 101  CO2 20*  GLUCOSE 86  BUN 5*  CREATININE 0.74  CALCIUM 7.9*   GFR Estimated Creatinine Clearance: 119.9 mL/min (by C-G formula based on SCr of 0.74 mg/dL). Liver Function Tests: Recent Labs  Lab 07/25/20 1517  AST 76*  ALT 37  ALKPHOS 175*  BILITOT 0.9  PROT 5.9*  ALBUMIN 2.7*   No results for input(s): LIPASE, AMYLASE in the last 168 hours. No results for input(s): AMMONIA in  the last 168 hours. Coagulation profile Recent Labs  Lab 07/25/20 1651  INR 1.0    Cardiac Enzymes: No results for input(s): CKTOTAL, CKMB, CKMBINDEX, TROPONINI in the last 168 hours. BNP: Invalid input(s): POCBNP CBG: No results for input(s): GLUCAP in the last 168 hours. D-Dimer No results for input(s): DDIMER in the last 72 hours. Hgb A1c No results for input(s): HGBA1C in the last 72 hours. Lipid Profile No results for input(s): CHOL, HDL, LDLCALC, TRIG, CHOLHDL, LDLDIRECT in the last 72 hours. Thyroid function studies No results for input(s): TSH, T4TOTAL, T3FREE, THYROIDAB in the last 72 hours.  Invalid input(s): FREET3 Anemia work up No results for input(s): VITAMINB12, FOLATE, FERRITIN, TIBC, IRON, RETICCTPCT in the last 72 hours. Urinalysis    Component Value Date/Time   COLORURINE AMBER (A) 07/25/2020 1432   APPEARANCEUR HAZY (A) 07/25/2020 1432   APPEARANCEUR Clear 06/12/2020 1657   LABSPEC 1.035 (H) 07/25/2020 1432   PHURINE 6.0 07/25/2020 1432   GLUCOSEU NEGATIVE 07/25/2020 1432   HGBUR NEGATIVE 07/25/2020 1432   BILIRUBINUR SMALL (A) 07/25/2020 1432   BILIRUBINUR Negative 06/12/2020 1657   KETONESUR 80 (A) 07/25/2020 1432   PROTEINUR 100 (A) 07/25/2020 1432   UROBILINOGEN >8.0 (H) 06/12/2015 2230   NITRITE NEGATIVE 07/25/2020 1432   LEUKOCYTESUR SMALL (A) 07/25/2020 1432     Microbiology Recent Results (from the past 240 hour(s))  Novel Coronavirus, NAA ([facility])     Status: Abnormal   Collection Time: 07/21/20   9:15 AM   Specimen: Nasopharyngeal(NP) swabs in vial transport medium   Nasopharynge  Patient  Result Value Ref Range Status   SARS-CoV-2, NAA Detected (A) Not Detected Final    Comment: Patients who have a positive COVID-19 test result may now have treatment options. Treatment options are available for patients with mild to moderate symptoms and for hospitalized patients. Visit our website at [email or web address] for resources and information. This nucleic acid amplification test was developed and its performance characteristics determined by [employer]. Nucleic acid amplification tests include RT-PCR and TMA. This test has not been FDA cleared or approved. This test has been authorized by FDA under an Emergency Use Authorization (EUA). This test is only authorized for the duration of time the declaration that circumstances exist justifying the authorization of the emergency use of in vitro diagnostic tests for detection of SARS-CoV-2 virus and/or diagnosis of COVID-19 infection under section 564(b)(1) of the Act, 21 U.S.C. 419FXT-0(W) (1), unless the authorization is terminated or revoked sooner. When diagnostic testing is negativ e, the possibility of a false negative result should be considered in the context of a patient's recent exposures and the presence of clinical signs and symptoms consistent with COVID-19. An individual without symptoms of COVID-19 and who is not shedding SARS-CoV-2 virus would expect to have a negative (not detected) result in this assay.   SARS-COV-2, NAA 2 DAY TAT     Status: None   Collection Time: 07/21/20  9:15 AM   Nasopharynge  Patient  Result Value Ref Range Status   SARS-CoV-2, NAA 2 DAY TAT Performed  Final  Culture, group A strep     Status: None   Collection Time: 07/21/20  9:21 AM   Specimen: Throat  Result Value Ref Range Status   Specimen Description   Final    THROAT Performed at [facility], [address], [place], [place] [address]    Special Requests   Final    Normal Performed at [facility], [place]  [address] [place], [place] [address]    Culture   Final    NO GROUP A STREP (S.PYOGENES) ISOLATED Performed at [facility], [place] [address] [place], [place] [address]    Report Status 07/24/2020 FINAL  Final       Inpatient Medications:   Scheduled Meds: . vitamin C  500 mg Oral Daily  . methylPREDNISolone (SOLU-MEDROL) injection  0.25 mg/kg Intravenous Q12H  . zinc sulfate  220 mg Oral Daily   Continuous Infusions: . remdesivir 200 mg in sodium chloride 0.9% 250 mL IVPB     Followed by  . [START ON 07/26/2020] remdesivir 100 mg in NS 100 mL       Radiological Exams on Admission: DG CHEST PORT 1 VIEW  Result Date: 07/25/2020 CLINICAL DATA:  Short of breath, COVID-19 positive, pregnant EXAM: PORTABLE CHEST 1 VIEW COMPARISON:  01/29/2019 FINDINGS: Single frontal view of the chest demonstrates a stable cardiac silhouette. There is patchy bilateral ground-glass airspace disease consistent with multifocal pneumonia given COVID-19 positivity. Mild central vascular congestion likely due to gravid state. No effusion or pneumothorax. No acute bony abnormalities. IMPRESSION: 1. Multifocal bilateral ground-glass airspace disease consistent with pneumonia. Pattern is consistent with COVID-19. Electronically Signed   By: Randa Ngo M.D.   On: 07/25/2020 16:15    Impression/Recommendations Active Problems:   History of gestational hypertension   Morbid obesity ([place])   Anemia affecting third pregnancy   Sepsis ([place])   Pneumonia due to COVID-19 virus   Sepsis secondary to Covid pneumonia: -Patient has fever of 101, tachycardic, tachypneic.  Reviewed chest x-ray.  COVID-19 positive on 07/21/2020.  Currently on room air. -Start on remdesivir as per pharmacy and Solu-Medrol. -Blood culture, lactic acid, inflammatory markers: Pending -Repeat inflammatory markers tomorrow AM -Albuterol  breathing treatment every 6 hours.  P.o. vitamins and antitussive. -On continuous pulse ox.  Continue to monitor vitals.  Avoid NSAIDs. -Zofran as needed for nausea and vomiting  UTI: Patient reports dark-colored and foul-smelling urine -UA positive for leukocytes. -We will start patient on Rocephin.  Urine culture is pending  Anemia of pregnancy: H&H is stable -Continue prenatal vitamins  Third trimester pregnancy: -Managed by OB -Good fetal movements.  Hypokalemia/hyponatremia: -Replenished.  Check magnesium level -Repeat CMP tomorrow AM.  History of gestational Hypertension: Labetalol was recently discontinued by OB/GYN due to low blood pressure.  Currently blood pressure is on lower side.  Received IV fluids. -Monitor blood pressure closely.  Hold labetalol for now.  Low yes this is Dr. Adonis Housekeeper  Thank you for this consultation.  Our [facility] hospitalist team will follow the patient with you.  Time Spent: 35 minutes  Mckinley Jewel M.D. [facility] 07/25/2020, 5:38 PM

## 2020-07-25 NOTE — Telephone Encounter (Signed)
Patient called stating that she was told by Dr. Glo Herring to call this morning to speak with him. Please contact pt

## 2020-07-25 NOTE — MAU Note (Signed)
+  covid 8/15 (call from desk).  Pt taken straight to neg pressure rm.  Placed on isolation.

## 2020-07-26 ENCOUNTER — Other Ambulatory Visit: Payer: PRIVATE HEALTH INSURANCE

## 2020-07-26 ENCOUNTER — Telehealth: Payer: Self-pay | Admitting: Obstetrics and Gynecology

## 2020-07-26 DIAGNOSIS — I10 Essential (primary) hypertension: Secondary | ICD-10-CM

## 2020-07-26 DIAGNOSIS — O10913 Unspecified pre-existing hypertension complicating pregnancy, third trimester: Secondary | ICD-10-CM

## 2020-07-26 DIAGNOSIS — U071 COVID-19: Secondary | ICD-10-CM | POA: Diagnosis not present

## 2020-07-26 DIAGNOSIS — J1282 Pneumonia due to coronavirus disease 2019: Secondary | ICD-10-CM | POA: Diagnosis not present

## 2020-07-26 DIAGNOSIS — R7401 Elevation of levels of liver transaminase levels: Secondary | ICD-10-CM | POA: Diagnosis not present

## 2020-07-26 DIAGNOSIS — O98513 Other viral diseases complicating pregnancy, third trimester: Secondary | ICD-10-CM | POA: Diagnosis not present

## 2020-07-26 DIAGNOSIS — O34219 Maternal care for unspecified type scar from previous cesarean delivery: Secondary | ICD-10-CM | POA: Diagnosis not present

## 2020-07-26 DIAGNOSIS — A419 Sepsis, unspecified organism: Secondary | ICD-10-CM | POA: Diagnosis not present

## 2020-07-26 DIAGNOSIS — Z3A34 34 weeks gestation of pregnancy: Secondary | ICD-10-CM | POA: Diagnosis not present

## 2020-07-26 DIAGNOSIS — O99019 Anemia complicating pregnancy, unspecified trimester: Secondary | ICD-10-CM | POA: Diagnosis not present

## 2020-07-26 LAB — COMPREHENSIVE METABOLIC PANEL
ALT: 33 U/L (ref 0–44)
AST: 63 U/L — ABNORMAL HIGH (ref 15–41)
Albumin: 2.4 g/dL — ABNORMAL LOW (ref 3.5–5.0)
Alkaline Phosphatase: 179 U/L — ABNORMAL HIGH (ref 38–126)
Anion gap: 15 (ref 5–15)
BUN: <5 mg/dL — ABNORMAL LOW (ref 6–20)
CO2: 14 mmol/L — ABNORMAL LOW (ref 22–32)
Calcium: 8 mg/dL — ABNORMAL LOW (ref 8.9–10.3)
Chloride: 106 mmol/L (ref 98–111)
Creatinine, Ser: 0.71 mg/dL (ref 0.44–1.00)
GFR calc Af Amer: >60 mL/min (ref >60)
GFR calc non Af Amer: >60 mL/min (ref >60)
Glucose, Bld: 79 mg/dL (ref 70–99)
Potassium: 3.5 mmol/L (ref 3.5–5.1)
Sodium: 135 mmol/L (ref 135–145)
Total Bilirubin: 1.3 mg/dL — ABNORMAL HIGH (ref 0.3–1.2)
Total Protein: 6.2 g/dL — ABNORMAL LOW (ref 6.5–8.1)

## 2020-07-26 LAB — D-DIMER, QUANTITATIVE: D-Dimer, Quant: 1.59 ug/mL-FEU — ABNORMAL HIGH (ref 0.00–0.50)

## 2020-07-26 LAB — CBC WITH DIFFERENTIAL/PLATELET
Abs Immature Granulocytes: 0.2 10*3/uL — ABNORMAL HIGH (ref 0.00–0.07)
Basophils Absolute: 0 10*3/uL (ref 0.0–0.1)
Basophils Relative: 0 %
Eosinophils Absolute: 0 10*3/uL (ref 0.0–0.5)
Eosinophils Relative: 0 %
HCT: 30.6 % — ABNORMAL LOW (ref 36.0–46.0)
Hemoglobin: 9 g/dL — ABNORMAL LOW (ref 12.0–15.0)
Lymphocytes Relative: 7 %
Lymphs Abs: 0.6 10*3/uL — ABNORMAL LOW (ref 0.7–4.0)
MCH: 20.1 pg — ABNORMAL LOW (ref 26.0–34.0)
MCHC: 29.4 g/dL — ABNORMAL LOW (ref 30.0–36.0)
MCV: 68.5 fL — ABNORMAL LOW (ref 80.0–100.0)
Metamyelocytes Relative: 1 %
Monocytes Absolute: 0.3 10*3/uL (ref 0.1–1.0)
Monocytes Relative: 3 %
Myelocytes: 1 %
Neutro Abs: 7.7 10*3/uL (ref 1.7–7.7)
Neutrophils Relative %: 88 %
Platelets: 230 10*3/uL (ref 150–400)
RBC: 4.47 MIL/uL (ref 3.87–5.11)
RDW: 17.1 % — ABNORMAL HIGH (ref 11.5–15.5)
WBC: 8.8 10*3/uL (ref 4.0–10.5)
nRBC: 0.3 % — ABNORMAL HIGH (ref 0.0–0.2)
nRBC: 1 /100 WBC — ABNORMAL HIGH

## 2020-07-26 LAB — MAGNESIUM: Magnesium: 1.7 mg/dL (ref 1.7–2.4)

## 2020-07-26 LAB — C-REACTIVE PROTEIN: CRP: 10.4 mg/dL — ABNORMAL HIGH (ref <1.0)

## 2020-07-26 LAB — PHOSPHORUS: Phosphorus: 2.9 mg/dL (ref 2.5–4.6)

## 2020-07-26 LAB — FERRITIN: Ferritin: 37 ng/mL (ref 11–307)

## 2020-07-26 MED ORDER — FERROUS FUMARATE 324 (106 FE) MG PO TABS
1.0000 | ORAL_TABLET | Freq: Every day | ORAL | Status: DC
  Administered 2020-07-26: 106 mg via ORAL
  Filled 2020-07-26: qty 1

## 2020-07-26 MED ORDER — SODIUM CHLORIDE 0.9 % IV SOLN: INTRAVENOUS | Status: DC | PRN

## 2020-07-26 MED ORDER — SODIUM CHLORIDE 0.9 % IV SOLN: 510.0000 mg | Freq: Once | INTRAVENOUS | Status: DC

## 2020-07-26 MED ORDER — PHENOL 1.4 % MT LIQD
1.0000 | OROMUCOSAL | Status: DC | PRN
  Administered 2020-07-26: 1 via OROMUCOSAL
  Filled 2020-07-26: qty 177

## 2020-07-26 MED ORDER — FAMOTIDINE 20 MG PO TABS
40.0000 mg | ORAL_TABLET | Freq: Once | ORAL | Status: AC
  Administered 2020-07-26: 40 mg via ORAL
  Filled 2020-07-26: qty 2

## 2020-07-26 NOTE — Progress Notes (Signed)
PROGRESS NOTE    Melissa Livingston  JKD:[id number] DOB: 11/01/1996 DOA: 07/25/2020 PCP: Kathyrn Drown, MD    Brief Narrative:  24 year old G3 P2-0-0-2, 34 weeks 5-day, tested positive for Covid on 8/15 because of sore throat and headache.  Presented to ER on 8/19 morning with nausea, vomiting, poor oral intake and sore throat, cough with some shortness of breath on ambulation.  Patient has no other medical issues.  She was on labetalol for gestational hypertension.  Admitted to the OB/GYN service and started on Covid directed therapy with steroids and remdesivir.   Assessment & Plan:   Active Problems:   History of gestational hypertension   Morbid obesity ([facility])   Anemia affecting third pregnancy   Sepsis ([place])   Pneumonia due to COVID-19 virus  Pneumonia due to COVID-19 virus: COVID-19 Labs  Recent Labs    07/25/20 1752 07/26/20 0651  DDIMER  --  1.59*  FERRITIN 35 37  LDH 216*  --   CRP 9.0* 10.4*    Lab Results  Component Value Date   SARSCOV2NAA Detected (A) 07/21/2020   [place] Not Detected 01/30/2020   SpO2: 98 %  Continue to monitor due to significant symptoms of shortness of breath. chest physiotherapy, incentive spirometry, deep breathing exercises, sputum induction, mucolytic's and bronchodilators. Supplemental oxygen to keep saturations more than 90%. Covid directed therapy with , steroids on Solu-Medrol, will continue remdesivir, day 2/5 actemra, not indicated antibiotics, on Rocephin for presumed UTI VTE prophylaxis, Lovenox Due to severity of symptoms, patient will need daily inflammatory markers, chest x-rays, liver function test to monitor and direct COVID-19 therapies.  Suspected UTI: Continue Rocephin today.  Wait for urine culture.  34+ weeks pregnant: Previous C-section.  As per OB/GYN.  Mobilize.  Aggressive chest physiotherapy today.  If patient is a stable and symptom controlled by tomorrow, will recommend discharge home with outpatient  remdesivir clinic follow-up to finish therapy.   DVT prophylaxis: SCDs Start: 07/25/20 2153   Code Status: Full code Family Communication: None Disposition Plan: Status is: Inpatient  Remains inpatient appropriate because:IV treatments appropriate due to intensity of illness or inability to take PO   Dispo: The patient is from: Home              Anticipated d/c is to: Home              Anticipated d/c date is: 1 day              Patient currently is not medically stable to d/c.         Consultants:   Medicine consulted by OB/GYN  Procedures:   None  Antimicrobials:  Antibiotics Given (last 72 hours)    Date/Time Action Medication Dose Rate   07/25/20 1857 New Bag/Given   cefTRIAXone (ROCEPHIN) 1 g in sodium chloride 0.9 % 100 mL IVPB 1 g 200 mL/hr   07/25/20 2049 New Bag/Given  [medication late from pharmacy.]   remdesivir 200 mg in sodium chloride 0.9% 250 mL IVPB 200 mg 580 mL/hr         Subjective: Patient seen and examined.  She has ongoing mucoid cough.  On room air at rest.  Unable to take deep breathing with nagging cough.  Objective: Vitals:   07/26/20 0100 07/26/20 0450 07/26/20 0946 07/26/20 1300  BP: 121/72 122/69 (!) 102/51 116/67  Pulse: (!) 114 (!) 106 (!) 121 (!) 113  Resp: 20 20 (!) 34 (!) 28  Temp: 98.4 F (36.9 C)  98.7 F (37.1 C) 98.4 F (36.9 C) 98.7 F (37.1 C)  TempSrc: Oral Oral Oral Axillary  SpO2: 98% 99% 98% 98%  Weight:      Height:        Intake/Output Summary (Last 24 hours) at 07/26/2020 1311 Last data filed at 07/26/2020 0300 Gross per 24 hour  Intake 0 ml  Output --  Net 0 ml   Filed Weights   07/25/20 1430  Weight: 99.7 kg    Examination:  General exam: Appears calm and comfortable  Not in any distress at rest. Respiratory system: Some conducted airway sounds.  No added sounds. Cardiovascular system: S1 & S2 heard, RRR.  Gastrointestinal system: Gravid uterus.   Central nervous system: Alert and  oriented. No focal neurological deficits. Extremities: Symmetric 5 x 5 power.   Data Reviewed: I have personally reviewed following labs and imaging studies  CBC: Recent Labs  Lab 07/25/20 1517 07/25/20 1651 07/26/20 0651  WBC 7.8  --  8.8  NEUTROABS 7.6  --  7.7  HGB 9.6*  --  9.0*  HCT 32.0*  --  30.6*  MCV 68.8*  --  68.5*  PLT 227 242 831   Basic Metabolic Panel: Recent Labs  Lab 07/25/20 1517 07/25/20 1800 07/26/20 0651  NA 133*  --  135  K 3.3*  --  3.5  CL 101  --  106  CO2 20*  --  14*  GLUCOSE 86  --  79  BUN 5*  --  <5*  CREATININE 0.74  --  0.71  CALCIUM 7.9*  --  8.0*  MG  --  1.5* 1.7  PHOS  --   --  2.9   GFR: Estimated Creatinine Clearance: 119.9 mL/min (by C-G formula based on SCr of 0.71 mg/dL). Liver Function Tests: Recent Labs  Lab 07/25/20 1517 07/26/20 0651  AST 76* 63*  ALT 37 33  ALKPHOS 175* 179*  BILITOT 0.9 1.3*  PROT 5.9* 6.2*  ALBUMIN 2.7* 2.4*   No results for input(s): LIPASE, AMYLASE in the last 168 hours. No results for input(s): AMMONIA in the last 168 hours. Coagulation Profile: Recent Labs  Lab 07/25/20 1651  INR 1.0   Cardiac Enzymes: No results for input(s): CKTOTAL, CKMB, CKMBINDEX, TROPONINI in the last 168 hours. BNP (last 3 results) No results for input(s): PROBNP in the last 8760 hours. HbA1C: No results for input(s): HGBA1C in the last 72 hours. CBG: No results for input(s): GLUCAP in the last 168 hours. Lipid Profile: No results for input(s): CHOL, HDL, LDLCALC, TRIG, CHOLHDL, LDLDIRECT in the last 72 hours. Thyroid Function Tests: No results for input(s): TSH, T4TOTAL, FREET4, T3FREE, THYROIDAB in the last 72 hours. Anemia Panel: Recent Labs    07/25/20 1752 07/26/20 0651  FERRITIN 35 37   Sepsis Labs: Recent Labs  Lab 07/25/20 1651 07/25/20 1752 07/25/20 1947  PROCALCITON  --  0.78  --   LATICACIDVEN 1.5  --  1.5    Recent Results (from the past 240 hour(s))  Novel Coronavirus, NAA  ([facility])     Status: Abnormal   Collection Time: 07/21/20  9:15 AM   Specimen: Nasopharyngeal(NP) swabs in vial transport medium   Nasopharynge  Patient  Result Value Ref Range Status   SARS-CoV-2, NAA Detected (A) Not Detected Final    Comment: Patients who have a positive COVID-19 test result may now have treatment options. Treatment options are available for patients with mild to moderate symptoms and for hospitalized patients.  Visit our website at [email or web address] for resources and information. This nucleic acid amplification test was developed and its performance characteristics determined by [employer]. Nucleic acid amplification tests include RT-PCR and TMA. This test has not been FDA cleared or approved. This test has been authorized by FDA under an Emergency Use Authorization (EUA). This test is only authorized for the duration of time the declaration that circumstances exist justifying the authorization of the emergency use of in vitro diagnostic tests for detection of SARS-CoV-2 virus and/or diagnosis of COVID-19 infection under section 564(b)(1) of the Act, 21 U.S.C. 010XNA-3(F) (1), unless the authorization is terminated or revoked sooner. When diagnostic testing is negativ e, the possibility of a false negative result should be considered in the context of a patient's recent exposures and the presence of clinical signs and symptoms consistent with COVID-19. An individual without symptoms of COVID-19 and who is not shedding SARS-CoV-2 virus would expect to have a negative (not detected) result in this assay.   SARS-COV-2, NAA 2 DAY TAT     Status: None   Collection Time: 07/21/20  9:15 AM   Nasopharynge  Patient  Result Value Ref Range Status   SARS-CoV-2, NAA 2 DAY TAT Performed  Final  Culture, group A strep     Status: None   Collection Time: 07/21/20  9:21 AM   Specimen: Throat  Result Value Ref Range Status   Specimen Description    Final    THROAT Performed at [facility], [address], [place], [place] [address]    Special Requests   Final    Normal Performed at [facility], [address] [place], [place] [address]    Culture   Final    NO GROUP A STREP (S.PYOGENES) ISOLATED Performed at [facility], [place] [address] [place], [place] [address]    Report Status 07/24/2020 FINAL  Final  Culture, blood (routine x 2)     Status: None (Preliminary result)   Collection Time: 07/25/20  4:51 PM   Specimen: Blood  Result Value Ref Range Status   Specimen Description BLOOD LEFT ANTECUBITAL  Final   Special Requests   Final    BOTTLES DRAWN AEROBIC AND ANAEROBIC Blood Culture adequate volume   Culture   Final    NO GROWTH < 12 HOURS Performed at [facility], [place] [address], [place], [place] [address]    Report Status PENDING  Incomplete  Culture, blood (routine x 2)     Status: None (Preliminary result)   Collection Time: 07/25/20  4:51 PM   Specimen: Blood  Result Value Ref Range Status   Specimen Description BLOOD LEFT ANTECUBITAL  Final   Special Requests   Final    BOTTLES DRAWN AEROBIC AND ANAEROBIC Blood Culture adequate volume   Culture   Final    NO GROWTH < 12 HOURS Performed at [facility], [place] [address], [place], [place] [address]    Report Status PENDING  Incomplete         Radiology Studies: DG CHEST PORT 1 VIEW  Result Date: 07/25/2020 CLINICAL DATA:  Short of breath, COVID-19 positive, pregnant EXAM: PORTABLE CHEST 1 VIEW COMPARISON:  01/29/2019 FINDINGS: Single frontal view of the chest demonstrates a stable cardiac silhouette. There is patchy bilateral ground-glass airspace disease consistent with multifocal pneumonia given COVID-19 positivity. Mild central vascular congestion likely due to gravid state. No effusion or pneumothorax. No acute bony abnormalities. IMPRESSION: 1. Multifocal bilateral ground-glass airspace disease  consistent with pneumonia. Pattern is  consistent with COVID-19. Electronically Signed   By: Randa Ngo M.D.   On: 07/25/2020 16:15        Scheduled Meds: . vitamin C  500 mg Oral Daily  . enoxaparin (LOVENOX) injection  50 mg Subcutaneous Q24H  . Ferrous Fumarate  1 tablet Oral Daily  . methylPREDNISolone (SOLU-MEDROL) injection  0.25 mg/kg Intravenous Q12H  . zinc sulfate  220 mg Oral Daily   Continuous Infusions: . cefTRIAXone (ROCEPHIN)  IV 1 g (07/25/20 1857)  . remdesivir 100 mg in NS 100 mL       LOS: 1 day    Time spent: 30 minutes    Barb Merino, MD [facility] Pager [phone]

## 2020-07-26 NOTE — Progress Notes (Signed)
Patient ID: Melissa Livingston, female   DOB: [birth year], 24 y.o.   MRN: [id number] [place] PROGRESS NOTE  Kimimila MEAGHEN VECCHIARELLI is a 24 y.o. G3P2002 at [redacted]w[redacted]d  who is admitted for COVID pneumonia in setting of IUP 34 5/7 weeks.   Fetal presentation is unsure. Length of Stay:  1  Days  Subjective: Pt reports feeling better this morning. She denies SOB.  Patient reports good fetal movement.  She reports no uterine contractions, no bleeding and no loss of fluid per vagina.  Vitals:  Blood pressure (!) 102/51, pulse (!) 121, temperature 98.4 F (36.9 C), temperature source Oral, resp. rate (!) 34, height 5\' 3"  (1.6 m), weight 99.7 kg, last menstrual period 11/26/2019, SpO2 98 %.   Physical Examination: Lungs decreased BS bibasilar  Heart RRR Abd soft + BS gravid Ext SCD's  Fetal Monitoring:  150's, + accels, no ut ctx  Labs:  Results for orders placed or performed during the hospital encounter of 07/25/20 (from the past 24 hour(s))  Urinalysis, Routine w reflex microscopic Urine, Clean Catch   Collection Time: 07/25/20  2:32 PM  Result Value Ref Range   Color, Urine AMBER (A) YELLOW   APPearance HAZY (A) CLEAR   Specific Gravity, Urine 1.035 (H) 1.005 - 1.030   pH 6.0 5.0 - 8.0   Glucose, UA NEGATIVE NEGATIVE mg/dL   Hgb urine dipstick NEGATIVE NEGATIVE   Bilirubin Urine SMALL (A) NEGATIVE   Ketones, ur 80 (A) NEGATIVE mg/dL   Protein, ur 100 (A) NEGATIVE mg/dL   Nitrite NEGATIVE NEGATIVE   Leukocytes,Ua SMALL (A) NEGATIVE   RBC / HPF 0-5 0 - 5 RBC/hpf   WBC, UA 11-20 0 - 5 WBC/hpf   Bacteria, UA FEW (A) NONE SEEN   Squamous Epithelial / LPF 6-10 0 - 5   Mucus PRESENT   Type and screen   Collection Time: 07/25/20  3:15 PM  Result Value Ref Range   ABO/RH(D) O POS    Antibody Screen NEG    Sample Expiration      07/28/2020,2359 Performed at [facility], [address] [place], [place] [address]   CBC with Differential/Platelet   Collection Time:  07/25/20  3:17 PM  Result Value Ref Range   WBC 7.8 4.0 - 10.5 K/uL   RBC 4.65 3.87 - 5.11 MIL/uL   Hemoglobin 9.6 (L) 12.0 - 15.0 g/dL   HCT 32.0 (L) 36 - 46 %   MCV 68.8 (L) 80.0 - 100.0 fL   MCH 20.6 (L) 26.0 - 34.0 pg   MCHC 30.0 30.0 - 36.0 g/dL   RDW 17.0 (H) 11.5 - 15.5 %   Platelets 227 150 - 400 K/uL   nRBC 0.4 (H) 0.0 - 0.2 %   Neutrophils Relative % 97 %   Neutro Abs 7.6 1.7 - 7.7 K/uL   Lymphocytes Relative 3 %   Lymphs Abs 0.2 (L) 0.7 - 4.0 K/uL   Monocytes Relative 0 %   Monocytes Absolute 0.0 (L) 0 - 1 K/uL   Eosinophils Relative 0 %   Eosinophils Absolute 0.0 0 - 0 K/uL   Basophils Relative 0 %   Basophils Absolute 0.0 0 - 0 K/uL   WBC Morphology See Note    nRBC 0 0 /100 WBC   Abs Immature Granulocytes 0.00 0.00 - 0.07 K/uL  Comprehensive metabolic panel   Collection Time: 07/25/20  3:17 PM  Result Value Ref Range   Sodium 133 (L)  135 - 145 mmol/L   Potassium 3.3 (L) 3.5 - 5.1 mmol/L   Chloride 101 98 - 111 mmol/L   CO2 20 (L) 22 - 32 mmol/L   Glucose, Bld 86 70 - 99 mg/dL   BUN 5 (L) 6 - 20 mg/dL   Creatinine, Ser 0.74 0.44 - 1.00 mg/dL   Calcium 7.9 (L) 8.9 - 10.3 mg/dL   Total Protein 5.9 (L) 6.5 - 8.1 g/dL   Albumin 2.7 (L) 3.5 - 5.0 g/dL   AST 76 (H) 15 - 41 U/L   ALT 37 0 - 44 U/L   Alkaline Phosphatase 175 (H) 38 - 126 U/L   Total Bilirubin 0.9 0.3 - 1.2 mg/dL   GFR calc non Af Amer >60 >60 mL/min   GFR calc Af Amer >60 >60 mL/min   Anion gap 12 5 - 15  Protein / creatinine ratio, urine   Collection Time: 07/25/20  3:17 PM  Result Value Ref Range   Creatinine, Urine 343.68 mg/dL   Total Protein, Urine 109 mg/dL   Protein Creatinine Ratio 0.32 (H) 0.00 - 0.15 mg/mg[Cre]  Culture, blood (routine x 2)   Collection Time: 07/25/20  4:51 PM   Specimen: BLOOD  Result Value Ref Range   Specimen Description BLOOD LEFT ANTECUBITAL    Special Requests      BOTTLES DRAWN AEROBIC AND ANAEROBIC Blood Culture adequate volume   Culture      NO GROWTH  < 12 HOURS Performed at [place] [address], [place], [place] [address]    Report Status PENDING   Culture, blood (routine x 2)   Collection Time: 07/25/20  4:51 PM   Specimen: BLOOD  Result Value Ref Range   Specimen Description BLOOD LEFT ANTECUBITAL    Special Requests      BOTTLES DRAWN AEROBIC AND ANAEROBIC Blood Culture adequate volume   Culture      NO GROWTH < 12 HOURS Performed at [facility], [place] [address] [place], [place] [address]    Report Status PENDING   Lactic acid, plasma   Collection Time: 07/25/20  4:51 PM  Result Value Ref Range   Lactic Acid, Venous 1.5 0.5 - 1.9 mmol/L  Protime-INR   Collection Time: 07/25/20  4:51 PM  Result Value Ref Range   Prothrombin Time 12.9 11.4 - 15.2 seconds   INR 1.0 0.8 - 1.2  APTT   Collection Time: 07/25/20  4:51 PM  Result Value Ref Range   aPTT 32 24 - 36 seconds  Fibrinogen   Collection Time: 07/25/20  4:51 PM  Result Value Ref Range   Fibrinogen 507 (H) 210 - 475 mg/dL  Platelet count   Collection Time: 07/25/20  4:51 PM  Result Value Ref Range   Platelets 242 150 - 400 K/uL  Save Smear   Collection Time: 07/25/20  4:51 PM  Result Value Ref Range   Smear Review SMEAR STAINED AND AVAILABLE FOR REVIEW   Brain natriuretic peptide   Collection Time: 07/25/20  4:51 PM  Result Value Ref Range   B Natriuretic Peptide 26.2 0.0 - 100.0 pg/mL  C-reactive protein   Collection Time: 07/25/20  5:52 PM  Result Value Ref Range   CRP 9.0 (H) <1.0 mg/dL  Ferritin   Collection Time: 07/25/20  5:52 PM  Result Value Ref Range   Ferritin 35 11 - 307 ng/mL  Hepatitis B surface antigen   Collection Time: 07/25/20  5:52 PM  Result  Value Ref Range   Hepatitis B Surface Ag NON REACTIVE NON REACTIVE  Lactate dehydrogenase   Collection Time: 07/25/20  5:52 PM  Result Value Ref Range   LDH 216 (H) 98 - 192 U/L  Procalcitonin   Collection Time: 07/25/20  5:52 PM  Result Value Ref Range   Procalcitonin  0.78 ng/mL  Troponin I (High Sensitivity)   Collection Time: 07/25/20  5:52 PM  Result Value Ref Range   Troponin I (High Sensitivity) 4 <18 ng/L  Magnesium   Collection Time: 07/25/20  6:00 PM  Result Value Ref Range   Magnesium 1.5 (L) 1.7 - 2.4 mg/dL  Lactic acid, plasma   Collection Time: 07/25/20  7:47 PM  Result Value Ref Range   Lactic Acid, Venous 1.5 0.5 - 1.9 mmol/L  Troponin I (High Sensitivity)   Collection Time: 07/25/20  7:47 PM  Result Value Ref Range   Troponin I (High Sensitivity) 4 <18 ng/L  CBC with Differential/Platelet   Collection Time: 07/26/20  6:51 AM  Result Value Ref Range   WBC 8.8 4.0 - 10.5 K/uL   RBC 4.47 3.87 - 5.11 MIL/uL   Hemoglobin 9.0 (L) 12.0 - 15.0 g/dL   HCT 30.6 (L) 36 - 46 %   MCV 68.5 (L) 80.0 - 100.0 fL   MCH 20.1 (L) 26.0 - 34.0 pg   MCHC 29.4 (L) 30.0 - 36.0 g/dL   RDW 17.1 (H) 11.5 - 15.5 %   Platelets 230 150 - 400 K/uL   nRBC 0.3 (H) 0.0 - 0.2 %   Neutrophils Relative % 88 %   Neutro Abs 7.7 1.7 - 7.7 K/uL   Lymphocytes Relative 7 %   Lymphs Abs 0.6 (L) 0.7 - 4.0 K/uL   Monocytes Relative 3 %   Monocytes Absolute 0.3 0 - 1 K/uL   Eosinophils Relative 0 %   Eosinophils Absolute 0.0 0 - 0 K/uL   Basophils Relative 0 %   Basophils Absolute 0.0 0 - 0 K/uL   WBC Morphology See Note    nRBC 1 (H) 0 /100 WBC   Metamyelocytes Relative 1 %   Myelocytes 1 %   Abs Immature Granulocytes 0.20 (H) 0.00 - 0.07 K/uL  Comprehensive metabolic panel   Collection Time: 07/26/20  6:51 AM  Result Value Ref Range   Sodium 135 135 - 145 mmol/L   Potassium 3.5 3.5 - 5.1 mmol/L   Chloride 106 98 - 111 mmol/L   CO2 14 (L) 22 - 32 mmol/L   Glucose, Bld 79 70 - 99 mg/dL   BUN <5 (L) 6 - 20 mg/dL   Creatinine, Ser 0.71 0.44 - 1.00 mg/dL   Calcium 8.0 (L) 8.9 - 10.3 mg/dL   Total Protein 6.2 (L) 6.5 - 8.1 g/dL   Albumin 2.4 (L) 3.5 - 5.0 g/dL   AST 63 (H) 15 - 41 U/L   ALT 33 0 - 44 U/L   Alkaline Phosphatase 179 (H) 38 - 126 U/L   Total  Bilirubin 1.3 (H) 0.3 - 1.2 mg/dL   GFR calc non Af Amer >60 >60 mL/min   GFR calc Af Amer >60 >60 mL/min   Anion gap 15 5 - 15  C-reactive protein   Collection Time: 07/26/20  6:51 AM  Result Value Ref Range   CRP 10.4 (H) <1.0 mg/dL  D-dimer, quantitative (not at [facility])   Collection Time: 07/26/20  6:51 AM  Result Value Ref Range   D-Dimer, Quant 1.59 (H)  0.00 - 0.50 ug/mL-FEU  Ferritin   Collection Time: 07/26/20  6:51 AM  Result Value Ref Range   Ferritin 37 11 - 307 ng/mL  Magnesium   Collection Time: 07/26/20  6:51 AM  Result Value Ref Range   Magnesium 1.7 1.7 - 2.4 mg/dL  Phosphorus   Collection Time: 07/26/20  6:51 AM  Result Value Ref Range   Phosphorus 2.9 2.5 - 4.6 mg/dL    Imaging Studies:    NA   Medications:  Scheduled . vitamin C  500 mg Oral Daily  . enoxaparin (LOVENOX) injection  50 mg Subcutaneous Q24H  . Ferrous Fumarate  1 tablet Oral Daily  . methylPREDNISolone (SOLU-MEDROL) injection  0.25 mg/kg Intravenous Q12H  . zinc sulfate  220 mg Oral Daily   I have reviewed the patient's current medications.  ASSESSMENT: IUP 34 5/7 weeks COVID 19 pneumonia CHTN Prior c section x 2  PLAN: Stable form an OB standpoint. Fetal status reassuring. MFM, Anesthesia and NICU aware of pt's admission in setting of pregnancy and COVID . Greatly appreciated [facility] service managing pt's COVID.  Continue routine antenatal care.   Chancy Milroy 07/26/2020,10:22 AM

## 2020-07-27 ENCOUNTER — Encounter (HOSPITAL_COMMUNITY): Payer: Self-pay | Admitting: Obstetrics and Gynecology

## 2020-07-27 ENCOUNTER — Ambulatory Visit (HOSPITAL_COMMUNITY): Payer: PRIVATE HEALTH INSURANCE

## 2020-07-27 DIAGNOSIS — O98513 Other viral diseases complicating pregnancy, third trimester: Secondary | ICD-10-CM | POA: Diagnosis not present

## 2020-07-27 DIAGNOSIS — O99019 Anemia complicating pregnancy, unspecified trimester: Secondary | ICD-10-CM

## 2020-07-27 DIAGNOSIS — O34219 Maternal care for unspecified type scar from previous cesarean delivery: Secondary | ICD-10-CM | POA: Diagnosis not present

## 2020-07-27 DIAGNOSIS — A419 Sepsis, unspecified organism: Secondary | ICD-10-CM

## 2020-07-27 DIAGNOSIS — Z3A34 34 weeks gestation of pregnancy: Secondary | ICD-10-CM | POA: Diagnosis not present

## 2020-07-27 DIAGNOSIS — J1282 Pneumonia due to coronavirus disease 2019: Secondary | ICD-10-CM | POA: Diagnosis not present

## 2020-07-27 DIAGNOSIS — R7401 Elevation of levels of liver transaminase levels: Secondary | ICD-10-CM | POA: Diagnosis not present

## 2020-07-27 DIAGNOSIS — O10913 Unspecified pre-existing hypertension complicating pregnancy, third trimester: Secondary | ICD-10-CM | POA: Diagnosis not present

## 2020-07-27 DIAGNOSIS — U071 COVID-19: Secondary | ICD-10-CM | POA: Diagnosis not present

## 2020-07-27 DIAGNOSIS — I10 Essential (primary) hypertension: Secondary | ICD-10-CM | POA: Diagnosis not present

## 2020-07-27 LAB — CBC WITH DIFFERENTIAL/PLATELET
Abs Immature Granulocytes: 0.28 10*3/uL — ABNORMAL HIGH (ref 0.00–0.07)
Basophils Absolute: 0 10*3/uL (ref 0.0–0.1)
Basophils Relative: 0 %
Eosinophils Absolute: 0 10*3/uL (ref 0.0–0.5)
Eosinophils Relative: 0 %
HCT: 30.8 % — ABNORMAL LOW (ref 36.0–46.0)
Hemoglobin: 8.9 g/dL — ABNORMAL LOW (ref 12.0–15.0)
Immature Granulocytes: 4 %
Lymphocytes Relative: 18 %
Lymphs Abs: 1.3 10*3/uL (ref 0.7–4.0)
MCH: 20 pg — ABNORMAL LOW (ref 26.0–34.0)
MCHC: 28.9 g/dL — ABNORMAL LOW (ref 30.0–36.0)
MCV: 69.1 fL — ABNORMAL LOW (ref 80.0–100.0)
Monocytes Absolute: 0.4 10*3/uL (ref 0.1–1.0)
Monocytes Relative: 5 %
Neutro Abs: 5.1 10*3/uL (ref 1.7–7.7)
Neutrophils Relative %: 73 %
Platelets: 273 10*3/uL (ref 150–400)
RBC: 4.46 MIL/uL (ref 3.87–5.11)
RDW: 17.3 % — ABNORMAL HIGH (ref 11.5–15.5)
WBC: 7 10*3/uL (ref 4.0–10.5)
nRBC: 0.6 % — ABNORMAL HIGH (ref 0.0–0.2)

## 2020-07-27 LAB — COMPREHENSIVE METABOLIC PANEL
ALT: 29 U/L (ref 0–44)
AST: 37 U/L (ref 15–41)
Albumin: 2.3 g/dL — ABNORMAL LOW (ref 3.5–5.0)
Alkaline Phosphatase: 167 U/L — ABNORMAL HIGH (ref 38–126)
Anion gap: 17 — ABNORMAL HIGH (ref 5–15)
BUN: 5 mg/dL — ABNORMAL LOW (ref 6–20)
CO2: 12 mmol/L — ABNORMAL LOW (ref 22–32)
Calcium: 8.6 mg/dL — ABNORMAL LOW (ref 8.9–10.3)
Chloride: 109 mmol/L (ref 98–111)
Creatinine, Ser: 0.74 mg/dL (ref 0.44–1.00)
GFR calc Af Amer: >60 mL/min (ref >60)
GFR calc non Af Amer: >60 mL/min (ref >60)
Glucose, Bld: 82 mg/dL (ref 70–99)
Potassium: 3.9 mmol/L (ref 3.5–5.1)
Sodium: 138 mmol/L (ref 135–145)
Total Bilirubin: 1.4 mg/dL — ABNORMAL HIGH (ref 0.3–1.2)
Total Protein: 6.1 g/dL — ABNORMAL LOW (ref 6.5–8.1)

## 2020-07-27 LAB — FERRITIN: Ferritin: 28 ng/mL (ref 11–307)

## 2020-07-27 LAB — D-DIMER, QUANTITATIVE: D-Dimer, Quant: 1.05 ug/mL-FEU — ABNORMAL HIGH (ref 0.00–0.50)

## 2020-07-27 LAB — MAGNESIUM: Magnesium: 1.7 mg/dL (ref 1.7–2.4)

## 2020-07-27 LAB — VITAMIN D 25 HYDROXY (VIT D DEFICIENCY, FRACTURES): Vit D, 25-Hydroxy: 7.42 ng/mL — ABNORMAL LOW (ref 30–100)

## 2020-07-27 LAB — C-REACTIVE PROTEIN: CRP: 7.5 mg/dL — ABNORMAL HIGH (ref <1.0)

## 2020-07-27 LAB — PHOSPHORUS: Phosphorus: 3.4 mg/dL (ref 2.5–4.6)

## 2020-07-27 MED ORDER — VITAMIN D (ERGOCALCIFEROL) 1.25 MG (50000 UNIT) PO CAPS: 50000.0000 [IU] | ORAL_CAPSULE | ORAL | 2 refills | Status: DC

## 2020-07-27 MED ORDER — DEXAMETHASONE 6 MG PO TABS: 6.0000 mg | ORAL_TABLET | Freq: Every day | ORAL | 0 refills | Status: DC

## 2020-07-27 MED ORDER — GUAIFENESIN-DM 100-10 MG/5ML PO SYRP: 10.0000 mL | ORAL_SOLUTION | ORAL | 0 refills | Status: DC | PRN

## 2020-07-27 MED ORDER — DEXAMETHASONE 6 MG PO TABS: 6.0000 mg | ORAL_TABLET | Freq: Every day | ORAL | 0 refills | Status: AC

## 2020-07-27 MED ORDER — SODIUM CHLORIDE 0.9 % IV SOLN
510.0000 mg | INTRAVENOUS | Status: DC
  Filled 2020-07-27: qty 17

## 2020-07-27 MED ORDER — ASCORBIC ACID 500 MG PO TABS
1000.0000 mg | ORAL_TABLET | Freq: Two times a day (BID) | ORAL | Status: DC
  Administered 2020-07-27: 1000 mg via ORAL
  Filled 2020-07-27: qty 2

## 2020-07-27 MED ORDER — ZINC SULFATE 220 (50 ZN) MG PO CAPS: 220.0000 mg | ORAL_CAPSULE | Freq: Every day | ORAL | 0 refills | Status: DC

## 2020-07-27 MED ORDER — ASCORBIC ACID 1000 MG PO TABS
1000.0000 mg | ORAL_TABLET | Freq: Two times a day (BID) | ORAL | 2 refills | Status: DC
Start: 2020-07-27 — End: 2020-07-27

## 2020-07-27 MED ORDER — ASCORBIC ACID 1000 MG PO TABS
1000.0000 mg | ORAL_TABLET | Freq: Two times a day (BID) | ORAL | 2 refills | Status: DC
Start: 2020-07-27 — End: 2021-02-05

## 2020-07-27 NOTE — Progress Notes (Signed)
PROGRESS NOTE    Melissa Livingston  DJM:[id number] DOB: [birth year] DOA: 07/25/2020 PCP: Kathyrn Drown, MD    Brief Narrative:  24 year old G3 P2-0-0-2, 34 weeks 5-day, tested positive for Covid on 8/15 because of sore throat and headache.  Presented to ER on 8/19 morning with nausea, vomiting, poor oral intake and sore throat, cough with some shortness of breath on ambulation.  Patient has no other medical issues.  She was on labetalol for gestational hypertension.  Admitted to the OB/GYN service and started on Covid directed therapy with steroids and remdesivir.   Assessment & Plan:   Active Problems:   History of gestational hypertension   Morbid obesity (HCC)   Anemia affecting third pregnancy   Sepsis ([place])   Pneumonia due to COVID-19 virus   Elevated AST (SGOT)  Pneumonia due to COVID-19 virus: COVID-19 Labs  Recent Labs    07/25/20 1752 07/26/20 0651 07/27/20 0651  DDIMER  --  1.59* 1.05*  FERRITIN 35 37 28  LDH 216*  --   --   CRP 9.0* 10.4* 7.5*    Lab Results  Component Value Date   SARSCOV2NAA Detected (A) 07/21/2020   [place] Not Detected 01/30/2020   SpO2: 99 %  Patient did good clinical response on his steroids and remdesivir therapy.   She is able to participate on chest physiotherapy.  Patient is medically stable so she will be able to go home.   Dexamethasone 6 mg daily for 7 more days, prescription sent to pharmacy  Cough medications as permitted to be used in pregnancy.   Continue chest physiotherapy and incentive spirometry at home.  Can use over-the-counter Tylenol.   Remdesivir therapy day 3/5 today in the hospital.  Infusion clinic set up for next 2 days and her fianc is able to drive her to the infusion clinic.    Suspected UTI: Unfortunately urine culture was not sent.  Patient is asymptomatic.  Rocephin discontinued.  34+ weeks pregnant: Previous C-section.  As per OB/GYN.  From the bedside regarding her COVID-19 pneumonia, we have  scheduled her for to finish her antiviral therapy at infusion center.  She can be discharged from the medical standpoint if deemed stable from OB/GYN. Communicated with bedside nurse.  Message sent to attending physician.   Thank you for involving [place] in care of this patient.  Please call us back with any questions.  DVT prophylaxis: SCDs Start: 07/25/20 2153   Code Status: Full code Family Communication: Fianc at bedside. Disposition Plan: Status is: Inpatient  Remains inpatient appropriate because:IV treatments appropriate due to intensity of illness or inability to take PO   Dispo: The patient is from: Home              Anticipated d/c is to: Home              Anticipated d/c date is: 1 day              Patient currently is medically stable.         Consultants:   Medicine consulted by OB/GYN  Procedures:   None  Antimicrobials:  Antibiotics Given (last 72 hours)    Date/Time Action Medication Dose Rate   07/25/20 1857 New Bag/Given   cefTRIAXone (ROCEPHIN) 1 g in sodium chloride 0.9 % 100 mL IVPB 1 g 200 mL/hr   07/25/20 2049 New Bag/Given  [medication late from pharmacy.]   remdesivir 200 mg in sodium chloride 0.9% 250 mL IVPB 200 mg 580  mL/hr   07/26/20 1847 New Bag/Given   remdesivir 100 mg in sodium chloride 0.9 % 100 mL IVPB 100 mg 200 mL/hr   07/26/20 2012 New Bag/Given   cefTRIAXone (ROCEPHIN) 1 g in sodium chloride 0.9 % 100 mL IVPB 1 g 200 mL/hr         Subjective: Patient seen and examined.  Mostly on room air.  Able to take deep breathing.  He still has some cough, however better than before.  Objective: Vitals:   07/26/20 1956 07/26/20 2237 07/27/20 0606 07/27/20 0928  BP:  119/71 118/63 109/69  Pulse:  (!) 113 (!) 103 (!) 102  Resp: (!) 24 (!) 24 (!) 22 20  Temp: 98.2 F (36.8 C) 98.8 F (37.1 C) 98 F (36.7 C) 98 F (36.7 C)  TempSrc: Oral Oral Oral Oral  SpO2: 100% 100% 100% 99%  Weight:      Height:        Intake/Output  Summary (Last 24 hours) at 07/27/2020 1050 Last data filed at 07/26/2020 2100 Gross per 24 hour  Intake 233.05 ml  Output --  Net 233.05 ml   Filed Weights   07/25/20 1430  Weight: 99.7 kg    Examination:  General exam: Appears calm and comfortable  Not in any distress at rest. Respiratory system: Some conducted airway sounds.  No added sounds. Cardiovascular system: S1 & S2 heard, RRR.  Gastrointestinal system: Gravid uterus.   Central nervous system: Alert and oriented. No focal neurological deficits. Extremities: Symmetric 5 x 5 power.   Data Reviewed: I have personally reviewed following labs and imaging studies  CBC: Recent Labs  Lab 07/25/20 1517 07/25/20 1651 07/26/20 0651 07/27/20 0651  WBC 7.8  --  8.8 7.0  NEUTROABS 7.6  --  7.7 5.1  HGB 9.6*  --  9.0* 8.9*  HCT 32.0*  --  30.6* 30.8*  MCV 68.8*  --  68.5* 69.1*  PLT 227 242 230 939   Basic Metabolic Panel: Recent Labs  Lab 07/25/20 1517 07/25/20 1800 07/26/20 0651 07/27/20 0651  NA 133*  --  135 138  K 3.3*  --  3.5 3.9  CL 101  --  106 109  CO2 20*  --  14* 12*  GLUCOSE 86  --  79 82  BUN 5*  --  <5* 5*  CREATININE 0.74  --  0.71 0.74  CALCIUM 7.9*  --  8.0* 8.6*  MG  --  1.5* 1.7 1.7  PHOS  --   --  2.9 3.4   GFR: Estimated Creatinine Clearance: 119.9 mL/min (by C-G formula based on SCr of 0.74 mg/dL). Liver Function Tests: Recent Labs  Lab 07/25/20 1517 07/26/20 0651 07/27/20 0651  AST 76* 63* 37  ALT 37 33 29  ALKPHOS 175* 179* 167*  BILITOT 0.9 1.3* 1.4*  PROT 5.9* 6.2* 6.1*  ALBUMIN 2.7* 2.4* 2.3*   No results for input(s): LIPASE, AMYLASE in the last 168 hours. No results for input(s): AMMONIA in the last 168 hours. Coagulation Profile: Recent Labs  Lab 07/25/20 1651  INR 1.0   Cardiac Enzymes: No results for input(s): CKTOTAL, CKMB, CKMBINDEX, TROPONINI in the last 168 hours. BNP (last 3 results) No results for input(s): PROBNP in the last 8760 hours. HbA1C: No  results for input(s): HGBA1C in the last 72 hours. CBG: No results for input(s): GLUCAP in the last 168 hours. Lipid Profile: No results for input(s): CHOL, HDL, LDLCALC, TRIG, CHOLHDL, LDLDIRECT in the last 72  hours. Thyroid Function Tests: No results for input(s): TSH, T4TOTAL, FREET4, T3FREE, THYROIDAB in the last 72 hours. Anemia Panel: Recent Labs    07/26/20 0651 07/27/20 0651  FERRITIN 37 28   Sepsis Labs: Recent Labs  Lab 07/25/20 1651 07/25/20 1752 07/25/20 1947  PROCALCITON  --  0.78  --   LATICACIDVEN 1.5  --  1.5    Recent Results (from the past 240 hour(s))  Novel Coronavirus, NAA ([facility])     Status: Abnormal   Collection Time: 07/21/20  9:15 AM   Specimen: Nasopharyngeal(NP) swabs in vial transport medium   Nasopharynge  Patient  Result Value Ref Range Status   SARS-CoV-2, NAA Detected (A) Not Detected Final    Comment: Patients who have a positive COVID-19 test result may now have treatment options. Treatment options are available for patients with mild to moderate symptoms and for hospitalized patients. Visit our website at [email or web address] for resources and information. This nucleic acid amplification test was developed and its performance characteristics determined by [employer]. Nucleic acid amplification tests include RT-PCR and TMA. This test has not been FDA cleared or approved. This test has been authorized by FDA under an Emergency Use Authorization (EUA). This test is only authorized for the duration of time the declaration that circumstances exist justifying the authorization of the emergency use of in vitro diagnostic tests for detection of SARS-CoV-2 virus and/or diagnosis of COVID-19 infection under section 564(b)(1) of the Act, 21 U.S.C. 259DGL-8(V) (1), unless the authorization is terminated or revoked sooner. When diagnostic testing is negativ e, the possibility of a false negative result should be considered  in the context of a patient's recent exposures and the presence of clinical signs and symptoms consistent with COVID-19. An individual without symptoms of COVID-19 and who is not shedding SARS-CoV-2 virus would expect to have a negative (not detected) result in this assay.   SARS-COV-2, NAA 2 DAY TAT     Status: None   Collection Time: 07/21/20  9:15 AM   Nasopharynge  Patient  Result Value Ref Range Status   SARS-CoV-2, NAA 2 DAY TAT Performed  Final  Culture, group A strep     Status: None   Collection Time: 07/21/20  9:21 AM   Specimen: Throat  Result Value Ref Range Status   Specimen Description   Final    THROAT Performed at [facility], [address] [place], [place] [address]    Special Requests   Final    Normal Performed at [facility], [address], [place], [place] [address]    Culture   Final    NO GROUP A STREP (S.PYOGENES) ISOLATED Performed at [facility], [place] [address], [place], [place] [address]    Report Status 07/24/2020 FINAL  Final  Culture, blood (routine x 2)     Status: None (Preliminary result)   Collection Time: 07/25/20  4:51 PM   Specimen: Blood  Result Value Ref Range Status   Specimen Description BLOOD LEFT ANTECUBITAL  Final   Special Requests   Final    BOTTLES DRAWN AEROBIC AND ANAEROBIC Blood Culture adequate volume   Culture   Final    NO GROWTH < 12 HOURS Performed at [facility], [place] [address] [place], [place] [address]    Report Status PENDING  Incomplete  Culture, blood (routine x 2)     Status: None (Preliminary result)   Collection Time: 07/25/20  4:51 PM   Specimen: Blood  Result  Value Ref Range Status   Specimen Description BLOOD LEFT ANTECUBITAL  Final   Special Requests   Final    BOTTLES DRAWN AEROBIC AND ANAEROBIC Blood Culture adequate volume   Culture   Final    NO GROWTH < 12 HOURS Performed at [facility], [address] [place], [place] [address]    Report Status PENDING  Incomplete           Radiology Studies: DG CHEST PORT 1 VIEW  Result Date: 07/25/2020 CLINICAL DATA:  Short of breath, COVID-19 positive, pregnant EXAM: PORTABLE CHEST 1 VIEW COMPARISON:  01/29/2019 FINDINGS: Single frontal view of the chest demonstrates a stable cardiac silhouette. There is patchy bilateral ground-glass airspace disease consistent with multifocal pneumonia given COVID-19 positivity. Mild central vascular congestion likely due to gravid state. No effusion or pneumothorax. No acute bony abnormalities. IMPRESSION: 1. Multifocal bilateral ground-glass airspace disease consistent with pneumonia. Pattern is consistent with COVID-19. Electronically Signed   By: Randa Ngo M.D.   On: 07/25/2020 16:15        Scheduled Meds:  vitamin C  1,000 mg Oral BID   enoxaparin (LOVENOX) injection  50 mg Subcutaneous Q24H   methylPREDNISolone (SOLU-MEDROL) injection  0.25 mg/kg Intravenous Q12H   zinc sulfate  220 mg Oral Daily   Continuous Infusions:  sodium chloride 10 mL/hr at 07/26/20 1844   remdesivir 100 mg in NS 100 mL 100 mg (07/26/20 1847)     LOS: 2 days    Time spent: 25 minutes   Barb Merino, MD [facility] Pager [phone]

## 2020-07-27 NOTE — Progress Notes (Signed)
Patient scheduled for outpatient Remdesivir infusions at 10am on Sunday 8/22 and Monday 8/23 at [facility]. Please inform the patient to park at [place], as staff will be escorting the patient through the [place] entrance of the hospital.    There is a wave flag banner located near the entrance on [address]. Turn into this entrance and immediately turn left and park in 1 of the 5 designated Covid Infusion Parking spots. There is a phone number on the sign, please call and let the staff know what spot you are in and we will come out and get you. For questions call [phone].  Thanks.

## 2020-07-27 NOTE — Telephone Encounter (Signed)
I left pt name and condition with infusion center after hours line.  I have left a message for pt to notify us if she has not heard from them.

## 2020-07-27 NOTE — Progress Notes (Addendum)
Daily Antepartum Note  Admission Date: 07/25/2020 Current Date: 07/27/2020 7:01 AM  Melissa Livingston is a 24 y.o. [id number] @ [redacted]w[redacted]d, HD#2, admitted for covid pna, sepsis.  Pregnancy complicated by: Patient Active Problem List   Diagnosis Date Noted  . Sepsis ([place]) 07/25/2020  . Pneumonia due to COVID-19 virus 07/25/2020  . Anemia affecting third pregnancy 07/16/2020  . Chronic hypertension affecting pregnancy 03/21/2020  . Supervision of high risk pregnancy, antepartum 02/22/2020  . Headache disorder 01/01/2019  . Chronic pain of both knees 08/25/2018  . Constipation 08/25/2018  . Acute left-sided low back pain with left-sided sciatica 01/09/2017  . Morbid obesity ([place]) 01/09/2017  . S/P cesarean section 03/04/2016  . 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 Normocytic anemia 06/13/2015  . Hematochezia 05/08/2015  . History of gestational hypertension 04/23/2015  . Rectal bleeding 07/18/2013  . GERD (gastroesophageal reflux disease) 07/18/2013  . Irritable bowel syndrome 07/03/2013  . Depression 07/03/2013  . Asthma 03/27/2013    Overnight/24hr events:  none  Subjective:  Pt feeling better. Still has slight cough and some cp with cough but no sob, issues with ambulation, fevers, chills. No OB s/s  Objective:    Current Vital Signs 24h Vital Sign Ranges  T 98 F (36.7 C) Temp  Avg: 98.3 F (36.8 C)  Min: 97.6 F (36.4 C)  Max: 98.8 F (37.1 C)  BP 118/63 BP  Min: 102/51  Max: 128/79  HR (!) 103 Pulse  Avg: 114.2  Min: 103  Max: 122  RR (!) 22 Resp  Avg: 25.9  Min: 22  Max: 34  SaO2 100 % Room Air SpO2  Avg: 99.4 %  Min: 98 %  Max: 100 %       24 Hour I/O Current Shift I/O  Time Ins Outs 08/20 0701 - 08/21 0700 In: 233.1 [I.V.:11.1] Out: -  No intake/output data recorded.   Patient Vitals for the past 24 hrs:  BP Temp Temp src Pulse Resp SpO2  07/27/20 0606 118/63 98 F (36.7 C) Oral (!) 103 (!) 22 100 %  07/26/20 2237 119/71 98.8 F (37.1 C) Oral (!) 113  (!) 24 100 %  07/26/20 1956 -- 98.2 F (36.8 C) Oral -- (!) 24 100 %  07/26/20 1833 113/69 98.4 F (36.9 C) Oral (!) 113 (!) 26 100 %  07/26/20 1530 128/79 97.6 F (36.4 C) Oral (!) 122 (!) 23 100 %  07/26/20 1300 116/67 98.7 F (37.1 C) Axillary (!) 113 (!) 28 98 %  07/26/20 0946 (!) 102/51 98.4 F (36.9 C) Oral (!) 121 (!) 34 98 %   FHT: 145 baseline, +accels, no decels, mod variability Toco: quiet x 51m  Physical exam: General: Well nourished, well developed female in no acute distress. Abdomen: gravid nttp Cardiovascular: S1, S2 normal, no murmur, rub or gallop, regular rate and rhythm Respiratory: CTAB Extremities: no clubbing, cyanosis or edema Skin: Warm and dry.  Back: no cvat  Medications: Current Facility-Administered Medications  Medication Dose Route Frequency Provider Last Rate Last Admin  . 0.9 %  sodium chloride infusion   Intravenous PRN Chancy Milroy, MD 10 mL/hr at 07/26/20 1844 New Bag at 07/26/20 1844  . acetaminophen (TYLENOL) tablet 500 mg  500 mg Oral Q4H PRN Griffin Basil, MD   500 mg at 07/26/20 1024  . albuterol (PROVENTIL) (2.5 MG/3ML) 0.083% nebulizer solution 2.5 mg  2.5 mg Nebulization Q6H PRN Pahwani, Rinka R, MD      . ascorbic  acid (VITAMIN C) tablet 1,000 mg  1,000 mg Oral BID Aletha Halim, MD      . cefTRIAXone (ROCEPHIN) 1 g in sodium chloride 0.9 % 100 mL IVPB  1 g Intravenous Q24H Pahwani, Rinka R, MD 200 mL/hr at 07/26/20 2012 1 g at 07/26/20 2012  . chlorpheniramine-HYDROcodone (TUSSIONEX) 10-8 MG/5ML suspension 5 mL  5 mL Oral Q12H PRN Pahwani, Rinka R, MD      . enoxaparin (LOVENOX) injection 50 mg  50 mg Subcutaneous Q24H Griffin Basil, MD   50 mg at 07/26/20 2108  . Ferrous Fumarate (HEMOCYTE - 106 mg FE) tablet 106 mg of iron  1 tablet Oral Daily Chancy Milroy, MD   106 mg of iron at 07/26/20 1259  . guaiFENesin-dextromethorphan (ROBITUSSIN DM) 100-10 MG/5ML syrup 10 mL  10 mL Oral Q4H PRN Pahwani, Rinka R, MD   10 mL at  07/25/20 1855  . methylPREDNISolone sodium succinate (SOLU-MEDROL) 40 mg/mL injection 24.8 mg  0.25 mg/kg Intravenous Q12H Pahwani, Rinka R, MD   24.8 mg at 07/27/20 0604  . metoCLOPramide (REGLAN) injection 10 mg  10 mg Intravenous Q6H PRN Griffin Basil, MD   10 mg at 07/26/20 1527  . phenol (CHLORASEPTIC) mouth spray 1 spray  1 spray Mouth/Throat PRN Chancy Milroy, MD   1 spray at 07/26/20 1024  . remdesivir 100 mg in sodium chloride 0.9 % 100 mL IVPB  100 mg Intravenous Daily Pahwani, Rinka R, MD 200 mL/hr at 07/26/20 1847 100 mg at 07/26/20 1847  . zinc sulfate capsule 220 mg  220 mg Oral Daily Pahwani, Rinka R, MD   220 mg at 07/26/20 8938    Labs:  Recent Labs  Lab 07/25/20 1517 07/25/20 1651 07/26/20 0651  WBC 7.8  --  8.8  HGB 9.6*  --  9.0*  HCT 32.0*  --  30.6*  PLT 227 242 230    Recent Labs  Lab 07/25/20 1517 07/26/20 0651  NA 133* 135  K 3.3* 3.5  CL 101 106  CO2 20* 14*  BUN 5* <5*  CREATININE 0.74 0.71  CALCIUM 7.9* 8.0*  PROT 5.9* 6.2*  BILITOT 0.9 1.3*  ALKPHOS 175* 179*  ALT 37 33  AST 76* 63*  GLUCOSE 86 79   AM labs pending  D-dimer decreased to 1.05 from 1.59  Radiology:  No new imaging  Assessment & Plan:  Pt improving *Pregnancy: routine care, reactive nst. Daily NSTs. Normal efw on 7/28.  *COVID PNA: hospitalist team following. Appreciate recommendations. Watch liver enzymes. D#3 of remdesivir and solumedrol. Continue zinc and vitamin c. Vit d level added on  *Preterm: no current issues *cHTN: was on labetalol but stopped due to low bps with covid. If have to restart, recommend something that has less risk of resp issues like procardia, norvasc *Anemia: recommend IV iron, which pt is amenable to. Okay per pharmacy *h/o c-section: no current issues.  *ID: on rocephin D#2 for ?UTI. No ucx sent. No s/s of pyelo. Will d/c rocephin *PPx: lovenox, oob ad lib *FEN/GI: regular diet *Dispo: potentially today, per hospitalist team.    Durene Romans MD Attending [facility] Phone: [phone] (M-F, 0800-1700) & [phone] (Off hours, weekends, holidays)

## 2020-07-27 NOTE — Discharge Summary (Signed)
Patient ID: Melissa Livingston MRN: [id number] DOB/AGE: February 18, 1996 24 y.o.  Admit date: 07/25/2020 Discharge date: 07/27/2020  Admission Diagnoses: COVID pneumonia, IUP 34 weeks  Discharge Diagnoses: SAA  Prenatal Procedures: NST  Consults: Neonatology, Maternal Fetal Medicine, Hospital service  Hospital Course:  This is a 24 y.o. [id number] with IUP at [redacted]w[redacted]d admitted for COVID pneumonia. Hospital service was consulted and they managed her COVID pneumonia. She received Remdisivir and steroids. She required no supplement oxygen while in the hospital.    She was observed, fetal heart rate monitoring remained reassuring during her hospitalization. On HD # 3 she was deemed stable to discharge home per medicine to complete her 5 day course of Remdisivir. Discharge instructions, medications and follow up were reviewed with pt. She verbalized understanding.   Discharge Exam: Temp:  [97.6 F (36.4 C)-98.8 F (37.1 C)] 98.4 F (36.9 C) (08/21 1139) Pulse Rate:  [102-122] 106 (08/21 1139) Resp:  [20-26] 20 (08/21 1139) BP: (109-128)/(63-79) 115/64 (08/21 1139) SpO2:  [99 %-100 %] 99 % (08/21 1139) Physical Examination: CONSTITUTIONAL: Well-developed, well-nourished female in no acute distress.  HENT:  Normocephalic, atraumatic, External right and left ear normal. Oropharynx is clear and moist EYES: Conjunctivae and EOM are normal. Pupils are equal, round, and reactive to light. No scleral icterus.  NECK: Normal range of motion, supple, no masses SKIN: Skin is warm and dry. No rash noted. Not diaphoretic. No erythema. No pallor. [place]: Alert and oriented to person, place, and time. Normal reflexes, muscle tone coordination. No cranial nerve deficit noted. PSYCHIATRIC: Normal mood and affect. Normal behavior. Normal judgment and thought content. CARDIOVASCULAR: Normal heart rate noted, regular rhythm RESPIRATORY: Effort and breath sounds normal, no problems with respiration noted MUSCULOSKELETAL:  Normal range of motion. No edema and no tenderness. 2+ distal pulses. ABDOMEN: Soft, nontender, nondistended, gravid. CERVIX:  Deffered  Fetal monitoring: FHR: 130 bpm, Variability: moderate, Accelerations: Present, Decelerations: Absent  Uterine activity: no contractions per hour  Significant Diagnostic Studies:  Results for orders placed or performed during the hospital encounter of 07/25/20 (from the past 168 hour(s))  Urinalysis, Routine w reflex microscopic Urine, Clean Catch   Collection Time: 07/25/20  2:32 PM  Result Value Ref Range   Color, Urine AMBER (A) YELLOW   APPearance HAZY (A) CLEAR   Specific Gravity, Urine 1.035 (H) 1.005 - 1.030   pH 6.0 5.0 - 8.0   Glucose, UA NEGATIVE NEGATIVE mg/dL   Hgb urine dipstick NEGATIVE NEGATIVE   Bilirubin Urine SMALL (A) NEGATIVE   Ketones, ur 80 (A) NEGATIVE mg/dL   Protein, ur 100 (A) NEGATIVE mg/dL   Nitrite NEGATIVE NEGATIVE   Leukocytes,Ua SMALL (A) NEGATIVE   RBC / HPF 0-5 0 - 5 RBC/hpf   WBC, UA 11-20 0 - 5 WBC/hpf   Bacteria, UA FEW (A) NONE SEEN   Squamous Epithelial / LPF 6-10 0 - 5   Mucus PRESENT   Type and screen   Collection Time: 07/25/20  3:15 PM  Result Value Ref Range   ABO/RH(D) O POS    Antibody Screen NEG    Sample Expiration      07/28/2020,2359 Performed at [facility], [address], [place], [place] [address]   CBC with Differential/Platelet   Collection Time: 07/25/20  3:17 PM  Result Value Ref Range   WBC 7.8 4.0 - 10.5 K/uL   RBC 4.65 3.87 - 5.11 MIL/uL   Hemoglobin 9.6 (L) 12.0 - 15.0 g/dL   HCT 32.0 (L)  36 - 46 %   MCV 68.8 (L) 80.0 - 100.0 fL   MCH 20.6 (L) 26.0 - 34.0 pg   MCHC 30.0 30.0 - 36.0 g/dL   RDW 17.0 (H) 11.5 - 15.5 %   Platelets 227 150 - 400 K/uL   nRBC 0.4 (H) 0.0 - 0.2 %   Neutrophils Relative % 97 %   Neutro Abs 7.6 1.7 - 7.7 K/uL   Lymphocytes Relative 3 %   Lymphs Abs 0.2 (L) 0.7 - 4.0 K/uL   Monocytes Relative 0 %   Monocytes Absolute 0.0 (L) 0 - 1 K/uL    Eosinophils Relative 0 %   Eosinophils Absolute 0.0 0 - 0 K/uL   Basophils Relative 0 %   Basophils Absolute 0.0 0 - 0 K/uL   WBC Morphology See Note    nRBC 0 0 /100 WBC   Abs Immature Granulocytes 0.00 0.00 - 0.07 K/uL  Comprehensive metabolic panel   Collection Time: 07/25/20  3:17 PM  Result Value Ref Range   Sodium 133 (L) 135 - 145 mmol/L   Potassium 3.3 (L) 3.5 - 5.1 mmol/L   Chloride 101 98 - 111 mmol/L   CO2 20 (L) 22 - 32 mmol/L   Glucose, Bld 86 70 - 99 mg/dL   BUN 5 (L) 6 - 20 mg/dL   Creatinine, Ser 0.74 0.44 - 1.00 mg/dL   Calcium 7.9 (L) 8.9 - 10.3 mg/dL   Total Protein 5.9 (L) 6.5 - 8.1 g/dL   Albumin 2.7 (L) 3.5 - 5.0 g/dL   AST 76 (H) 15 - 41 U/L   ALT 37 0 - 44 U/L   Alkaline Phosphatase 175 (H) 38 - 126 U/L   Total Bilirubin 0.9 0.3 - 1.2 mg/dL   GFR calc non Af Amer >60 >60 mL/min   GFR calc Af Amer >60 >60 mL/min   Anion gap 12 5 - 15  Protein / creatinine ratio, urine   Collection Time: 07/25/20  3:17 PM  Result Value Ref Range   Creatinine, Urine 343.68 mg/dL   Total Protein, Urine 109 mg/dL   Protein Creatinine Ratio 0.32 (H) 0.00 - 0.15 mg/mg[Cre]  Culture, blood (routine x 2)   Collection Time: 07/25/20  4:51 PM   Specimen: Blood  Result Value Ref Range   Specimen Description BLOOD LEFT ANTECUBITAL    Special Requests      BOTTLES DRAWN AEROBIC AND ANAEROBIC Blood Culture adequate volume   Culture      NO GROWTH < 12 HOURS Performed at [facility], [address] [place], [place] [address]    Report Status PENDING   Culture, blood (routine x 2)   Collection Time: 07/25/20  4:51 PM   Specimen: Blood  Result Value Ref Range   Specimen Description BLOOD LEFT ANTECUBITAL    Special Requests      BOTTLES DRAWN AEROBIC AND ANAEROBIC Blood Culture adequate volume   Culture      NO GROWTH < 12 HOURS Performed at [facility], [place] [address] [place], [place] [address]    Report Status PENDING   Lactic acid, plasma   Collection  Time: 07/25/20  4:51 PM  Result Value Ref Range   Lactic Acid, Venous 1.5 0.5 - 1.9 mmol/L  Protime-INR   Collection Time: 07/25/20  4:51 PM  Result Value Ref Range   Prothrombin Time 12.9 11.4 - 15.2 seconds   INR 1.0 0.8 - 1.2  APTT   Collection  Time: 07/25/20  4:51 PM  Result Value Ref Range   aPTT 32 24 - 36 seconds  Fibrinogen   Collection Time: 07/25/20  4:51 PM  Result Value Ref Range   Fibrinogen 507 (H) 210 - 475 mg/dL  Platelet count   Collection Time: 07/25/20  4:51 PM  Result Value Ref Range   Platelets 242 150 - 400 K/uL  Save Smear   Collection Time: 07/25/20  4:51 PM  Result Value Ref Range   Smear Review SMEAR STAINED AND AVAILABLE FOR REVIEW   Brain natriuretic peptide   Collection Time: 07/25/20  4:51 PM  Result Value Ref Range   B Natriuretic Peptide 26.2 0.0 - 100.0 pg/mL  C-reactive protein   Collection Time: 07/25/20  5:52 PM  Result Value Ref Range   CRP 9.0 (H) <1.0 mg/dL  Ferritin   Collection Time: 07/25/20  5:52 PM  Result Value Ref Range   Ferritin 35 11 - 307 ng/mL  Hepatitis B surface antigen   Collection Time: 07/25/20  5:52 PM  Result Value Ref Range   Hepatitis B Surface Ag NON REACTIVE NON REACTIVE  Lactate dehydrogenase   Collection Time: 07/25/20  5:52 PM  Result Value Ref Range   LDH 216 (H) 98 - 192 U/L  Procalcitonin   Collection Time: 07/25/20  5:52 PM  Result Value Ref Range   Procalcitonin 0.78 ng/mL  Troponin I (High Sensitivity)   Collection Time: 07/25/20  5:52 PM  Result Value Ref Range   Troponin I (High Sensitivity) 4 <18 ng/L  Magnesium   Collection Time: 07/25/20  6:00 PM  Result Value Ref Range   Magnesium 1.5 (L) 1.7 - 2.4 mg/dL  Lactic acid, plasma   Collection Time: 07/25/20  7:47 PM  Result Value Ref Range   Lactic Acid, Venous 1.5 0.5 - 1.9 mmol/L  Troponin I (High Sensitivity)   Collection Time: 07/25/20  7:47 PM  Result Value Ref Range   Troponin I (High Sensitivity) 4 <18 ng/L  CBC with  Differential/Platelet   Collection Time: 07/26/20  6:51 AM  Result Value Ref Range   WBC 8.8 4.0 - 10.5 K/uL   RBC 4.47 3.87 - 5.11 MIL/uL   Hemoglobin 9.0 (L) 12.0 - 15.0 g/dL   HCT 30.6 (L) 36 - 46 %   MCV 68.5 (L) 80.0 - 100.0 fL   MCH 20.1 (L) 26.0 - 34.0 pg   MCHC 29.4 (L) 30.0 - 36.0 g/dL   RDW 17.1 (H) 11.5 - 15.5 %   Platelets 230 150 - 400 K/uL   nRBC 0.3 (H) 0.0 - 0.2 %   Neutrophils Relative % 88 %   Neutro Abs 7.7 1.7 - 7.7 K/uL   Lymphocytes Relative 7 %   Lymphs Abs 0.6 (L) 0.7 - 4.0 K/uL   Monocytes Relative 3 %   Monocytes Absolute 0.3 0 - 1 K/uL   Eosinophils Relative 0 %   Eosinophils Absolute 0.0 0 - 0 K/uL   Basophils Relative 0 %   Basophils Absolute 0.0 0 - 0 K/uL   WBC Morphology See Note    RBC Morphology BURR CELLS    nRBC 1 (H) 0 /100 WBC   Metamyelocytes Relative 1 %   Myelocytes 1 %   Abs Immature Granulocytes 0.20 (H) 0.00 - 0.07 K/uL  Comprehensive metabolic panel   Collection Time: 07/26/20  6:51 AM  Result Value Ref Range   Sodium 135 135 - 145 mmol/L   Potassium 3.5 3.5 -  5.1 mmol/L   Chloride 106 98 - 111 mmol/L   CO2 14 (L) 22 - 32 mmol/L   Glucose, Bld 79 70 - 99 mg/dL   BUN <5 (L) 6 - 20 mg/dL   Creatinine, Ser 0.71 0.44 - 1.00 mg/dL   Calcium 8.0 (L) 8.9 - 10.3 mg/dL   Total Protein 6.2 (L) 6.5 - 8.1 g/dL   Albumin 2.4 (L) 3.5 - 5.0 g/dL   AST 63 (H) 15 - 41 U/L   ALT 33 0 - 44 U/L   Alkaline Phosphatase 179 (H) 38 - 126 U/L   Total Bilirubin 1.3 (H) 0.3 - 1.2 mg/dL   GFR calc non Af Amer >60 >60 mL/min   GFR calc Af Amer >60 >60 mL/min   Anion gap 15 5 - 15  C-reactive protein   Collection Time: 07/26/20  6:51 AM  Result Value Ref Range   CRP 10.4 (H) <1.0 mg/dL  D-dimer, quantitative (not at [facility])   Collection Time: 07/26/20  6:51 AM  Result Value Ref Range   D-Dimer, Quant 1.59 (H) 0.00 - 0.50 ug/mL-FEU  Ferritin   Collection Time: 07/26/20  6:51 AM  Result Value Ref Range   Ferritin 37 11 - 307 ng/mL  Magnesium    Collection Time: 07/26/20  6:51 AM  Result Value Ref Range   Magnesium 1.7 1.7 - 2.4 mg/dL  Phosphorus   Collection Time: 07/26/20  6:51 AM  Result Value Ref Range   Phosphorus 2.9 2.5 - 4.6 mg/dL  CBC with Differential/Platelet   Collection Time: 07/27/20  6:51 AM  Result Value Ref Range   WBC 7.0 4.0 - 10.5 K/uL   RBC 4.46 3.87 - 5.11 MIL/uL   Hemoglobin 8.9 (L) 12.0 - 15.0 g/dL   HCT 30.8 (L) 36 - 46 %   MCV 69.1 (L) 80.0 - 100.0 fL   MCH 20.0 (L) 26.0 - 34.0 pg   MCHC 28.9 (L) 30.0 - 36.0 g/dL   RDW 17.3 (H) 11.5 - 15.5 %   Platelets 273 150 - 400 K/uL   nRBC 0.6 (H) 0.0 - 0.2 %   Neutrophils Relative % 73 %   Neutro Abs 5.1 1.7 - 7.7 K/uL   Lymphocytes Relative 18 %   Lymphs Abs 1.3 0.7 - 4.0 K/uL   Monocytes Relative 5 %   Monocytes Absolute 0.4 0 - 1 K/uL   Eosinophils Relative 0 %   Eosinophils Absolute 0.0 0 - 0 K/uL   Basophils Relative 0 %   Basophils Absolute 0.0 0 - 0 K/uL   Immature Granulocytes 4 %   Abs Immature Granulocytes 0.28 (H) 0.00 - 0.07 K/uL  Comprehensive metabolic panel   Collection Time: 07/27/20  6:51 AM  Result Value Ref Range   Sodium 138 135 - 145 mmol/L   Potassium 3.9 3.5 - 5.1 mmol/L   Chloride 109 98 - 111 mmol/L   CO2 12 (L) 22 - 32 mmol/L   Glucose, Bld 82 70 - 99 mg/dL   BUN 5 (L) 6 - 20 mg/dL   Creatinine, Ser 0.74 0.44 - 1.00 mg/dL   Calcium 8.6 (L) 8.9 - 10.3 mg/dL   Total Protein 6.1 (L) 6.5 - 8.1 g/dL   Albumin 2.3 (L) 3.5 - 5.0 g/dL   AST 37 15 - 41 U/L   ALT 29 0 - 44 U/L   Alkaline Phosphatase 167 (H) 38 - 126 U/L   Total Bilirubin 1.4 (H) 0.3 - 1.2 mg/dL  GFR calc non Af Amer >60 >60 mL/min   GFR calc Af Amer >60 >60 mL/min   Anion gap 17 (H) 5 - 15  C-reactive protein   Collection Time: 07/27/20  6:51 AM  Result Value Ref Range   CRP 7.5 (H) <1.0 mg/dL  D-dimer, quantitative (not at [facility])   Collection Time: 07/27/20  6:51 AM  Result Value Ref Range   D-Dimer, Quant 1.05 (H) 0.00 - 0.50 ug/mL-FEU  Ferritin    Collection Time: 07/27/20  6:51 AM  Result Value Ref Range   Ferritin 28 11 - 307 ng/mL  Magnesium   Collection Time: 07/27/20  6:51 AM  Result Value Ref Range   Magnesium 1.7 1.7 - 2.4 mg/dL  Phosphorus   Collection Time: 07/27/20  6:51 AM  Result Value Ref Range   Phosphorus 3.4 2.5 - 4.6 mg/dL  VITAMIN D 25 Hydroxy (Vit-D Deficiency, Fractures)   Collection Time: 07/27/20  7:04 AM  Result Value Ref Range   Vit D, 25-Hydroxy 7.42 (L) 30 - 100 ng/mL  Results for orders placed or performed during the hospital encounter of 07/21/20 (from the past 168 hour(s))  Novel Coronavirus, NAA ([facility])   Collection Time: 07/21/20  9:15 AM   Specimen: Nasopharyngeal(NP) swabs in vial transport medium   Nasopharynge  Patient  Result Value Ref Range   SARS-CoV-2, NAA Detected (A) Not Detected  SARS-COV-2, NAA 2 DAY TAT   Collection Time: 07/21/20  9:15 AM   Nasopharynge  Patient  Result Value Ref Range   SARS-CoV-2, NAA 2 DAY TAT Performed   POCT rapid strep A   Collection Time: 07/21/20  9:15 AM  Result Value Ref Range   Rapid Strep A Screen Negative Negative  Culture, group A strep   Collection Time: 07/21/20  9:21 AM   Specimen: Throat  Result Value Ref Range   Specimen Description      THROAT Performed at [facility], [address], [place], [place] [address]    Special Requests      Normal Performed at [facility], [address], [place], [place] [address]    Culture      NO GROUP A STREP (S.PYOGENES) ISOLATED Performed at [facility], [place] [address] [place], [place] [address]    Report Status 07/24/2020 FINAL     Discharge Condition: Stable  Disposition: Discharge disposition: 01-Home or Self Care        Discharge Instructions    Discharge activity:  No Restrictions   Complete by: As directed    Discharge diet:  No restrictions   Complete by: As directed    Fetal Kick Count:  Lie on our left side for one hour after a meal, and count the number of  times your baby kicks.  If it is less than 5 times, get up, move around and drink some juice.  Repeat the test 30 minutes later.  If it is still less than 5 kicks in an hour, notify your doctor.   Complete by: As directed    No sexual activity restrictions   Complete by: As directed    Notify physician for a general feeling that "something is not right"   Complete by: As directed    Notify physician for increase or change in vaginal discharge   Complete by: As directed    Notify physician for intestinal cramps, with or without diarrhea, sometimes described as "gas pain"   Complete by: As directed    Notify physician  for leaking of fluid   Complete by: As directed    Notify physician for low, dull backache, unrelieved by heat or Tylenol   Complete by: As directed    Notify physician for menstrual like cramps   Complete by: As directed    Notify physician for pelvic pressure   Complete by: As directed    Notify physician for uterine contractions.  These may be painless and feel like the uterus is tightening or the baby is  "balling up"   Complete by: As directed    Notify physician for vaginal bleeding   Complete by: As directed    PRETERM LABOR:  Includes any of the follwing symptoms that occur between 20 - [redacted] weeks gestation.  If these symptoms are not stopped, preterm labor can result in preterm delivery, placing your baby at risk   Complete by: As directed      Allergies as of 07/27/2020      Reactions   Adhesive [tape] Other (See Comments)   Blisters    Lorabid [loracarbef] Hives, Swelling, Other (See Comments)   Mouth swelling Can take amoxil and augmentin    Latex Itching, Rash   Orange Fruit [citrus] Rash      Medication List    STOP taking these medications   butalbital-acetaminophen-caffeine 50-325-40 MG tablet Commonly known as: FIORICET   labetalol 100 MG tablet Commonly known as: NORMODYNE   labetalol 200 MG tablet Commonly known as: NORMODYNE   ondansetron  4 MG disintegrating tablet Commonly known as: Zofran ODT   oxyCODONE-acetaminophen 5-325 MG tablet Commonly known as: PERCOCET/ROXICET   promethazine 25 MG tablet Commonly known as: PHENERGAN     TAKE these medications   acetaminophen 325 MG tablet Commonly known as: TYLENOL Take 1,000 mg by mouth every 6 (six) hours as needed for mild pain, moderate pain or headache.   ascorbic acid 1000 MG tablet Commonly known as: VITAMIN C Take 1 tablet (1,000 mg total) by mouth 2 (two) times daily.   aspirin EC 81 MG tablet Take 2 tablets (162 mg total) by mouth daily.   Cepacol Regular Strength 3 MG lozenge Generic drug: menthol-cetylpyridinium Take 1 lozenge (3 mg total) by mouth as needed for sore throat.   cyclobenzaprine 10 MG tablet Commonly known as: FLEXERIL Take 1 tablet (10 mg total) by mouth every 8 (eight) hours as needed for muscle spasms.   dexamethasone 6 MG tablet Commonly known as: DECADRON Take 1 tablet (6 mg total) by mouth daily for 7 days.   ferrous sulfate 325 (65 FE) MG tablet Take 1 tablet (325 mg total) by mouth 2 (two) times daily with a meal.   fluticasone 50 MCG/ACT nasal spray Commonly known as: FLONASE Place 1 spray into both nostrils daily for 14 days. What changed:   when to take this  reasons to take this   guaiFENesin-dextromethorphan 100-10 MG/5ML syrup Commonly known as: ROBITUSSIN DM Take 10 mLs by mouth every 4 (four) hours as needed for cough.   multivitamin-prenatal 27-0.8 MG Tabs tablet Take 1 tablet by mouth daily at 12 noon.   omeprazole 20 MG capsule Commonly known as: PriLOSEC Take 1 capsule (20 mg total) by mouth daily. What changed:   when to take this  reasons to take this   Vitamin D (Ergocalciferol) 1.25 MG (50000 UNIT) Caps capsule Commonly known as: DRISDOL Take 1 capsule (50,000 Units total) by mouth every 7 (seven) days.   zinc sulfate 220 (50 Zn) MG capsule Take 1  capsule (220 mg total) by mouth  daily. Start taking on: July 28, 2020       Follow-up Information    [facility] Follow up.   Specialty: Obstetrics and Gynecology Why: Pt already has follow up appt Contact information: [place] [phone]              Signed: Chancy Milroy M.D. 07/27/2020, 1:41 PM

## 2020-07-27 NOTE — Progress Notes (Addendum)
Reviewed discharge instructions with patient regarding medications, when to go to MAU/call MD, follow up appointments, quarantine. Patient asked appropriate questions. Patient ambulatory and stable when leaving facility.   Updated pharmacy to [facility] [address] in [place]. MD notified, meds sent to updated pharmacy.

## 2020-07-27 NOTE — Discharge Instructions (Signed)
Patient scheduled for outpatient Remdesivir infusions at 10am on Sunday 8/22 and Monday 8/23 at [facility]. Please inform the patient to park at [place], as staff will be escorting the patient through the [place] entrance of the hospital.    There is a wave flag banner located near the entrance on [address]. Turn into this entrance and immediately turn left and park in 1 of the 5 designated Covid Infusion Parking spots. There is a phone number on the sign, please call and let the staff know what spot you are in and we will come out and get you. For questions call [phone].  Thanks.

## 2020-07-27 NOTE — Telephone Encounter (Signed)
After last call , pt's name and number left with infusion center, on their message line.  I called pt to see if she was contacted, and if not, pt advised to contact me and given my cell # to facilitate.

## 2020-07-28 ENCOUNTER — Encounter (HOSPITAL_COMMUNITY): Payer: Self-pay

## 2020-07-28 ENCOUNTER — Ambulatory Visit (HOSPITAL_COMMUNITY)
Admit: 2020-07-28 | Discharge: 2020-07-28 | Disposition: A | Discharge status: Home / Self Care | Payer: PRIVATE HEALTH INSURANCE | Source: Ambulatory Visit | Attending: Pulmonary Disease | Admitting: Pulmonary Disease

## 2020-07-28 DIAGNOSIS — J1289 Other viral pneumonia: Secondary | ICD-10-CM | POA: Insufficient documentation

## 2020-07-28 DIAGNOSIS — U071 COVID-19: Secondary | ICD-10-CM | POA: Insufficient documentation

## 2020-07-28 MED ORDER — DIPHENHYDRAMINE HCL 50 MG/ML IJ SOLN: 50.0000 mg | Freq: Once | INTRAMUSCULAR | Status: DC | PRN

## 2020-07-28 MED ORDER — FAMOTIDINE IN NACL 20-0.9 MG/50ML-% IV SOLN: 20.0000 mg | Freq: Once | INTRAVENOUS | Status: DC | PRN

## 2020-07-28 MED ORDER — SODIUM CHLORIDE 0.9 % IV SOLN
100.0000 mg | Freq: Once | INTRAVENOUS | Status: AC
  Administered 2020-07-28: 100 mg via INTRAVENOUS
  Filled 2020-07-28: qty 20

## 2020-07-28 MED ORDER — ALBUTEROL SULFATE HFA 108 (90 BASE) MCG/ACT IN AERS: 2.0000 | INHALATION_SPRAY | Freq: Once | RESPIRATORY_TRACT | Status: DC | PRN

## 2020-07-28 MED ORDER — METHYLPREDNISOLONE SODIUM SUCC 125 MG IJ SOLR: 125.0000 mg | Freq: Once | INTRAMUSCULAR | Status: DC | PRN

## 2020-07-28 MED ORDER — EPINEPHRINE 0.3 MG/0.3ML IJ SOAJ: 0.3000 mg | Freq: Once | INTRAMUSCULAR | Status: DC | PRN

## 2020-07-28 MED ORDER — SODIUM CHLORIDE 0.9 % IV SOLN: INTRAVENOUS | Status: DC | PRN

## 2020-07-28 NOTE — Progress Notes (Signed)
  Diagnosis: COVID-19  Physician: Joya Gaskins, MD  Procedure: Covid Infusion Clinic Med: remdesivir infusion - Provided patient with remdesivir fact sheet for patients, parents and caregivers prior to infusion.  Complications: No immediate complications noted.  Discharge: Discharged home   Beryle Flock 07/28/2020

## 2020-07-28 NOTE — Discharge Instructions (Signed)
10 Things You Can Do to Manage Your COVID-19 Symptoms at Home If you have possible or confirmed COVID-19: 1. Stay home from work and school. And stay away from other public places. If you must go out, avoid using any kind of public transportation, ridesharing, or taxis. 2. Monitor your symptoms carefully. If your symptoms get worse, call your healthcare provider immediately. 3. Get rest and stay hydrated. 4. If you have a medical appointment, call the healthcare provider ahead of time and tell them that you have or may have COVID-19. 5. For medical emergencies, call 911 and notify the dispatch personnel that you have or may have COVID-19. 6. Cover your cough and sneezes with a tissue or use the inside of your elbow. 7. Wash your hands often with soap and water for at least 20 seconds or clean your hands with an alcohol-based hand sanitizer that contains at least 60% alcohol. 8. As much as possible, stay in a specific room and away from other people in your home. Also, you should use a separate bathroom, if available. If you need to be around other people in or outside of the home, wear a mask. 9. Avoid sharing personal items with other people in your household, like dishes, towels, and bedding. 10. Clean all surfaces that are touched often, like counters, tabletops, and doorknobs. Use household cleaning sprays or wipes according to the label instructions. cdc.gov/coronavirus 06/07/2019 This information is not intended to replace advice given to you by your health care provider. Make sure you discuss any questions you have with your health care provider. Document Revised: 11/09/2019 Document Reviewed: 11/09/2019 Elsevier Patient Education  2020 Elsevier Inc.  

## 2020-07-29 ENCOUNTER — Ambulatory Visit (HOSPITAL_COMMUNITY)
Admit: 2020-07-29 | Discharge: 2020-07-29 | Disposition: A | Discharge status: Home / Self Care | Payer: PRIVATE HEALTH INSURANCE | Attending: Pulmonary Disease | Admitting: Pulmonary Disease

## 2020-07-29 ENCOUNTER — Telehealth: Payer: Self-pay | Admitting: *Deleted

## 2020-07-29 DIAGNOSIS — J1282 Pneumonia due to coronavirus disease 2019: Secondary | ICD-10-CM | POA: Diagnosis not present

## 2020-07-29 DIAGNOSIS — U071 COVID-19: Secondary | ICD-10-CM | POA: Insufficient documentation

## 2020-07-29 MED ORDER — METHYLPREDNISOLONE SODIUM SUCC 125 MG IJ SOLR: 125.0000 mg | Freq: Once | INTRAMUSCULAR | Status: DC | PRN

## 2020-07-29 MED ORDER — FAMOTIDINE IN NACL 20-0.9 MG/50ML-% IV SOLN: 20.0000 mg | Freq: Once | INTRAVENOUS | Status: DC | PRN

## 2020-07-29 MED ORDER — DIPHENHYDRAMINE HCL 50 MG/ML IJ SOLN: 50.0000 mg | Freq: Once | INTRAMUSCULAR | Status: DC | PRN

## 2020-07-29 MED ORDER — EPINEPHRINE 0.3 MG/0.3ML IJ SOAJ: 0.3000 mg | Freq: Once | INTRAMUSCULAR | Status: DC | PRN

## 2020-07-29 MED ORDER — SODIUM CHLORIDE 0.9 % IV SOLN
100.0000 mg | Freq: Once | INTRAVENOUS | Status: AC
  Administered 2020-07-29: 100 mg via INTRAVENOUS
  Filled 2020-07-29: qty 20

## 2020-07-29 MED ORDER — SODIUM CHLORIDE 0.9 % IV SOLN: INTRAVENOUS | Status: DC | PRN

## 2020-07-29 MED ORDER — ALBUTEROL SULFATE HFA 108 (90 BASE) MCG/ACT IN AERS: 2.0000 | INHALATION_SPRAY | Freq: Once | RESPIRATORY_TRACT | Status: DC | PRN

## 2020-07-29 NOTE — Discharge Instructions (Signed)
10 Things You Can Do to Manage Your COVID-19 Symptoms at Home If you have possible or confirmed COVID-19: 1. Stay home from work and school. And stay away from other public places. If you must go out, avoid using any kind of public transportation, ridesharing, or taxis. 2. Monitor your symptoms carefully. If your symptoms get worse, call your healthcare provider immediately. 3. Get rest and stay hydrated. 4. If you have a medical appointment, call the healthcare provider ahead of time and tell them that you have or may have COVID-19. 5. For medical emergencies, call 911 and notify the dispatch personnel that you have or may have COVID-19. 6. Cover your cough and sneezes with a tissue or use the inside of your elbow. 7. Wash your hands often with soap and water for at least 20 seconds or clean your hands with an alcohol-based hand sanitizer that contains at least 60% alcohol. 8. As much as possible, stay in a specific room and away from other people in your home. Also, you should use a separate bathroom, if available. If you need to be around other people in or outside of the home, wear a mask. 9. Avoid sharing personal items with other people in your household, like dishes, towels, and bedding. 10. Clean all surfaces that are touched often, like counters, tabletops, and doorknobs. Use household cleaning sprays or wipes according to the label instructions. cdc.gov/coronavirus 06/07/2019 This information is not intended to replace advice given to you by your health care provider. Make sure you discuss any questions you have with your health care provider. Document Revised: 11/09/2019 Document Reviewed: 11/09/2019 Elsevier Patient Education  2020 Elsevier Inc.  

## 2020-07-29 NOTE — Progress Notes (Signed)
  Diagnosis: COVID-19  Physician: Dr. Asencion Noble  Procedure: Covid Infusion Clinic Med: remdesivir infusion - Provided patient with remdesivir fact sheet for patients, parents and caregivers prior to infusion.  Complications: No immediate complications noted.  Discharge: Discharged home   Janine Ores 07/29/2020

## 2020-07-29 NOTE — Telephone Encounter (Signed)
Contacted patient to completed transition of care assessment:  Transition Care Management Follow-up Telephone Call   [facility] Managed Care Transition Call Status:MM [facility] Call Made   Date of discharge and from where: [facility], 07/27/20   How have you been since you were released from the hospital? "much better"   Any questions or concerns? No  Items Reviewed:  Did the pt receive and understand the discharge instructions provided? Yes   Medications obtained and verified? Yes   Any new allergies since your discharge? No   Dietary orders reviewed? No  Do you have support at home? Yes  Functional Questionnaire: (I = Independent and D = Dependent)  ADLs: Independent Bathing/Dressing:Independent Meal Prep: Independent Eating: Independent Maintaining continence: Independent Transferring/Ambulation: Independent Managing Meds: Independent   Follow up appointments reviewed:   PCP Hospital f/u appt confirmed? No  Patient to contact Dr Wolfgang Phoenix to see if follow up appt  [facility] f/u appt confirmed? Scheduled to see  Dr Mallory Shirk,  [facility] on 07/30/20 @ 1100  Are transportation arrangements needed? No   If their condition worsens, is the pt aware to call PCP or go to the EmergencyDept.? No  Was the patient provided with contact information for the PCP's office or ED? No  Was to pt encouraged to call back with questions or concerns? No  Lenor Coffin, RN, BSN, [place] [facility] [place] [phone]

## 2020-07-30 ENCOUNTER — Other Ambulatory Visit: Payer: PRIVATE HEALTH INSURANCE

## 2020-07-30 ENCOUNTER — Encounter: Payer: PRIVATE HEALTH INSURANCE | Admitting: Obstetrics & Gynecology

## 2020-07-30 LAB — CULTURE, BLOOD (ROUTINE X 2)
Culture: NO GROWTH
Culture: NO GROWTH
Special Requests: ADEQUATE
Special Requests: ADEQUATE

## 2020-08-02 ENCOUNTER — Ambulatory Visit (INDEPENDENT_AMBULATORY_CARE_PROVIDER_SITE_OTHER): Payer: PRIVATE HEALTH INSURANCE | Admitting: *Deleted

## 2020-08-02 VITALS — BP 123/78 | HR 97 | Wt 211.0 lb

## 2020-08-02 DIAGNOSIS — O0990 Supervision of high risk pregnancy, unspecified, unspecified trimester: Secondary | ICD-10-CM

## 2020-08-02 DIAGNOSIS — O10919 Unspecified pre-existing hypertension complicating pregnancy, unspecified trimester: Secondary | ICD-10-CM

## 2020-08-02 DIAGNOSIS — Z3A35 35 weeks gestation of pregnancy: Secondary | ICD-10-CM | POA: Diagnosis not present

## 2020-08-02 DIAGNOSIS — Z1389 Encounter for screening for other disorder: Secondary | ICD-10-CM

## 2020-08-02 DIAGNOSIS — Z3490 Encounter for supervision of normal pregnancy, unspecified, unspecified trimester: Secondary | ICD-10-CM

## 2020-08-02 DIAGNOSIS — Z331 Pregnant state, incidental: Secondary | ICD-10-CM | POA: Diagnosis not present

## 2020-08-02 LAB — POCT URINALYSIS DIPSTICK OB
Blood, UA: NEGATIVE
Glucose, UA: NEGATIVE
Ketones, UA: NEGATIVE
Leukocytes, UA: NEGATIVE
Nitrite, UA: NEGATIVE

## 2020-08-02 NOTE — Progress Notes (Addendum)
   NURSE VISIT- NST  SUBJECTIVE:  Melissa Livingston is a 24 y.o. G22P2002 female at [redacted]w[redacted]d, here for a NST for pregnancy complicated by [facility].  She reports active fetal movement, contractions: none, vaginal bleeding: none, membranes: intact.   OBJECTIVE:  BP 123/78   Pulse 97   Wt 211 lb (95.7 kg)   LMP 11/26/2019   BMI 37.38 kg/m   Appears well, no apparent distress  Results for orders placed or performed in visit on 08/02/20 (from the past 24 hour(s))  POC Urinalysis Dipstick OB   Collection Time: 08/02/20 11:34 AM  Result Value Ref Range   Color, UA     Clarity, UA     Glucose, UA Negative Negative   Bilirubin, UA     Ketones, UA n    Spec Grav, UA     Blood, UA n    pH, UA     POC,PROTEIN,UA Trace Negative, Trace, Small (1+), Moderate (2+), Large (3+), 4+   Urobilinogen, UA     Nitrite, UA n    Leukocytes, UA Negative Negative   Appearance     Odor      NST: FHR baseline 135 bpm, Variability: moderate, Accelerations:present, Decelerations:  Absent= Cat 1/Reactive Toco: none   ASSESSMENT: G3P2002 at [redacted]w[redacted]d with CHTN NST reactive  PLAN: EFM strip reviewed by Dr. Elonda Husky   Recommendations: keep next appointment as scheduled    Alice Rieger  08/02/2020 12:49 PM   Attestation of Attending Supervision of Nursing Visit Encounter: Evaluation and management procedures were performed by the nursing staff under my supervision and collaboration.  I have reviewed the nurse's note and chart, and I agree with the management and plan.  Jacelyn Grip MD Attending Physician for the Center for [facility] 09/03/2020 4:39 PM

## 2020-08-06 ENCOUNTER — Ambulatory Visit (INDEPENDENT_AMBULATORY_CARE_PROVIDER_SITE_OTHER): Payer: PRIVATE HEALTH INSURANCE

## 2020-08-06 ENCOUNTER — Ambulatory Visit (INDEPENDENT_AMBULATORY_CARE_PROVIDER_SITE_OTHER): Payer: PRIVATE HEALTH INSURANCE | Admitting: Obstetrics & Gynecology

## 2020-08-06 ENCOUNTER — Other Ambulatory Visit (HOSPITAL_COMMUNITY)
Admission: RE | Admit: 2020-08-06 | Discharge: 2020-08-06 | Disposition: A | Discharge status: Home / Self Care | Payer: PRIVATE HEALTH INSURANCE | Source: Ambulatory Visit | Attending: Obstetrics & Gynecology | Admitting: Obstetrics & Gynecology

## 2020-08-06 VITALS — BP 126/71 | HR 108 | Wt 217.0 lb

## 2020-08-06 DIAGNOSIS — Z113 Encounter for screening for infections with a predominantly sexual mode of transmission: Secondary | ICD-10-CM | POA: Diagnosis present

## 2020-08-06 DIAGNOSIS — Z1389 Encounter for screening for other disorder: Secondary | ICD-10-CM

## 2020-08-06 DIAGNOSIS — O10913 Unspecified pre-existing hypertension complicating pregnancy, third trimester: Secondary | ICD-10-CM

## 2020-08-06 DIAGNOSIS — O0990 Supervision of high risk pregnancy, unspecified, unspecified trimester: Secondary | ICD-10-CM

## 2020-08-06 DIAGNOSIS — Z3A36 36 weeks gestation of pregnancy: Secondary | ICD-10-CM

## 2020-08-06 DIAGNOSIS — O10919 Unspecified pre-existing hypertension complicating pregnancy, unspecified trimester: Secondary | ICD-10-CM

## 2020-08-06 DIAGNOSIS — Z331 Pregnant state, incidental: Secondary | ICD-10-CM

## 2020-08-06 DIAGNOSIS — O0993 Supervision of high risk pregnancy, unspecified, third trimester: Secondary | ICD-10-CM | POA: Diagnosis not present

## 2020-08-06 LAB — POCT URINALYSIS DIPSTICK OB
Blood, UA: NEGATIVE
Glucose, UA: NEGATIVE
Ketones, UA: NEGATIVE
Nitrite, UA: NEGATIVE

## 2020-08-06 MED ORDER — B & B CARPAL TUNNEL BRACE MISC: 2.0000 [IU] | Freq: Every day | 0 refills | Status: DC

## 2020-08-06 NOTE — Progress Notes (Signed)
[place] 39+1 wks,cephalic,BPP 7/9,EB .78,.37,.54=23%,TKCXWNPIO placenta gr 1,EFW 2829 g 45%,fhr 138 bpm

## 2020-08-06 NOTE — Progress Notes (Signed)
HIGH-RISK PREGNANCY VISIT Patient name: Melissa Livingston MRN [id number]  Date of birth: January 02, 1996 Chief Complaint:   Routine Prenatal Visit (NST)  History of Present Illness:   Melissa Livingston is a 24 y.o. G75P2002 female at [redacted]w[redacted]d with an Estimated Date of Delivery: 09/01/20 being seen today for ongoing management of a high-risk pregnancy complicated by chronic hypertension currently on .  Today she reports no complaints.  Depression screen [facility] 2/9 05/28/2020 02/22/2020 06/05/2019 01/03/2018 11/03/2016  Decreased Interest 0 0 1 0 0  Down, Depressed, Hopeless 0 0 0 0 0  PHQ - 2 Score 0 0 1 0 0  Altered sleeping 2 1 - - -  Tired, decreased energy 1 2 - - -  Change in appetite 1 3 - - -  Feeling bad or failure about yourself  0 0 - - -  Trouble concentrating 0 0 - - -  Moving slowly or fidgety/restless 0 0 - - -  Suicidal thoughts 0 0 - - -  PHQ-9 Score 4 6 - - -  Difficult doing work/chores Not difficult at all - - - -  Some recent data might be hidden    Contractions: Irregular. Vag. Bleeding: None.  Movement: Present. denies leaking of fluid.  Review of Systems:   Pertinent items are noted in HPI Denies abnormal vaginal discharge w/ itching/odor/irritation, headaches, visual changes, shortness of breath, chest pain, abdominal pain, severe nausea/vomiting, or problems with urination or bowel movements unless otherwise stated above. Pertinent History Reviewed:  Reviewed past medical,surgical, social, obstetrical and family history.  Reviewed problem list, medications and allergies. Physical Assessment:   Vitals:   08/06/20 1159  BP: 126/71  Pulse: (!) 108  Weight: 217 lb (98.4 kg)  Body mass index is 38.44 kg/m.           Physical Examination:   General appearance: alert, well appearing, and in no distress  Mental status: alert, oriented to person, place, and time  Skin: warm & dry   Extremities: Edema: None    Cardiovascular: normal heart rate noted  Respiratory: normal respiratory  effort, no distress  Abdomen: gravid, soft, non-tender  Pelvic: Cervical exam deferred  Dilation: 2.5 Effacement (%): 50    Fetal Status: Fetal Heart Rate (bpm): 138   Movement: Present Presentation: Vertex  Fetal Surveillance Testing today: Biophysical profile of 8 out of 8 with normal Dopplers  Chaperone:     Results for orders placed or performed in visit on 08/06/20 (from the past 24 hour(s))  POC Urinalysis Dipstick OB   Collection Time: 08/06/20 11:58 AM  Result Value Ref Range   Color, UA     Clarity, UA     Glucose, UA Negative Negative   Bilirubin, UA     Ketones, UA n    Spec Grav, UA     Blood, UA n    pH, UA     POC,PROTEIN,UA Trace Negative, Trace, Small (1+), Moderate (2+), Large (3+), 4+   Urobilinogen, UA     Nitrite, UA n    Leukocytes, UA Trace (A) Negative   Appearance     Odor      Assessment & Plan:  1) High-risk pregnancy G3P2002 at [redacted]w[redacted]d with an Estimated Date of Delivery: 09/01/20   2) CHTN, stable, BP normal  3) Previous C section x 2 repeat 08/1920,   Meds: No orders of the defined types were placed in this encounter.   Labs/procedures today:   Treatment Plan:  Reviewed: Term labor symptoms and general obstetric precautions including but not limited to vaginal bleeding, contractions, leaking of fluid and fetal movement were reviewed in detail with the patient.  All questions were answered. Has home bp cuff. Rx faxed to . Check bp weekly, let us know if >140/90.   Follow-up: Return in about 3 days (around 08/09/2020) for NST, HROB.  Orders Placed This Encounter  Procedures  . Strep Gp B NAA  . POC Urinalysis Dipstick OB   Florian Buff CNM, [facility] 08/06/2020 12:23 PM

## 2020-08-07 LAB — CERVICOVAGINAL ANCILLARY ONLY
Chlamydia: NEGATIVE
Comment: NEGATIVE
Comment: NORMAL
Neisseria Gonorrhea: NEGATIVE

## 2020-08-08 LAB — STREP GP B NAA: Strep Gp B NAA: POSITIVE — AB

## 2020-08-09 ENCOUNTER — Other Ambulatory Visit: Payer: Self-pay | Admitting: Obstetrics & Gynecology

## 2020-08-09 ENCOUNTER — Other Ambulatory Visit: Payer: Self-pay

## 2020-08-09 ENCOUNTER — Ambulatory Visit (INDEPENDENT_AMBULATORY_CARE_PROVIDER_SITE_OTHER): Payer: PRIVATE HEALTH INSURANCE | Admitting: *Deleted

## 2020-08-09 VITALS — BP 125/84 | HR 101 | Wt 224.5 lb

## 2020-08-09 DIAGNOSIS — O10919 Unspecified pre-existing hypertension complicating pregnancy, unspecified trimester: Secondary | ICD-10-CM

## 2020-08-09 DIAGNOSIS — Z3A36 36 weeks gestation of pregnancy: Secondary | ICD-10-CM | POA: Diagnosis not present

## 2020-08-09 DIAGNOSIS — Z1389 Encounter for screening for other disorder: Secondary | ICD-10-CM | POA: Diagnosis not present

## 2020-08-09 DIAGNOSIS — O0990 Supervision of high risk pregnancy, unspecified, unspecified trimester: Secondary | ICD-10-CM | POA: Diagnosis not present

## 2020-08-09 DIAGNOSIS — Z331 Pregnant state, incidental: Secondary | ICD-10-CM | POA: Diagnosis not present

## 2020-08-09 LAB — POCT URINALYSIS DIPSTICK OB
Glucose, UA: NEGATIVE
Ketones, UA: NEGATIVE
Nitrite, UA: NEGATIVE
POC,PROTEIN,UA: NEGATIVE

## 2020-08-09 NOTE — Progress Notes (Addendum)
   NURSE VISIT- NST  SUBJECTIVE:  Melissa Livingston is a 24 y.o. G31P2002 female at [redacted]w[redacted]d, here for a NST for pregnancy complicated by [facility].  She reports active fetal movement, contractions: none, vaginal bleeding: none, membranes: intact.   OBJECTIVE:  BP 125/84   Pulse (!) 101   Wt 224 lb 8 oz (101.8 kg)   LMP 11/26/2019   BMI 39.77 kg/m   Appears well, no apparent distress  Results for orders placed or performed in visit on 08/09/20 (from the past 24 hour(s))  POC Urinalysis Dipstick OB   Collection Time: 08/09/20 11:58 AM  Result Value Ref Range   Color, UA     Clarity, UA     Glucose, UA Negative Negative   Bilirubin, UA     Ketones, UA neg    Spec Grav, UA     Blood, UA trace    pH, UA     POC,PROTEIN,UA Negative Negative, Trace, Small (1+), Moderate (2+), Large (3+), 4+   Urobilinogen, UA     Nitrite, UA neg    Leukocytes, UA Trace (A) Negative   Appearance     Odor      NST: FHR baseline 135 bpm, Variability: moderate, Accelerations:present, Decelerations:  Absent= Cat 1/Reactive Toco: none   ASSESSMENT: G3P2002 at [redacted]w[redacted]d with CHTN NST reactive  PLAN: EFM strip reviewed by Dr. Elonda Husky   Recommendations: keep next appointment as scheduled    Alice Rieger  08/09/2020 12:37 PM   Attestation of Attending Supervision of Nursing Visit Encounter: Evaluation and management procedures were performed by the nursing staff under my supervision and collaboration.  I have reviewed the nurse's note and chart, and I agree with the management and plan.  Jacelyn Grip MD Attending Physician for the Center for [facility] 09/03/2020 4:44 PM

## 2020-08-13 ENCOUNTER — Encounter: Payer: Self-pay | Admitting: Obstetrics & Gynecology

## 2020-08-13 ENCOUNTER — Other Ambulatory Visit: Payer: Self-pay

## 2020-08-13 ENCOUNTER — Ambulatory Visit (INDEPENDENT_AMBULATORY_CARE_PROVIDER_SITE_OTHER): Payer: PRIVATE HEALTH INSURANCE | Admitting: Obstetrics & Gynecology

## 2020-08-13 ENCOUNTER — Ambulatory Visit (INDEPENDENT_AMBULATORY_CARE_PROVIDER_SITE_OTHER): Payer: PRIVATE HEALTH INSURANCE

## 2020-08-13 VITALS — BP 123/81 | HR 107 | Wt 224.0 lb

## 2020-08-13 DIAGNOSIS — O10913 Unspecified pre-existing hypertension complicating pregnancy, third trimester: Secondary | ICD-10-CM

## 2020-08-13 DIAGNOSIS — O0993 Supervision of high risk pregnancy, unspecified, third trimester: Secondary | ICD-10-CM | POA: Diagnosis not present

## 2020-08-13 DIAGNOSIS — Z331 Pregnant state, incidental: Secondary | ICD-10-CM

## 2020-08-13 DIAGNOSIS — Z1389 Encounter for screening for other disorder: Secondary | ICD-10-CM

## 2020-08-13 DIAGNOSIS — Z3A37 37 weeks gestation of pregnancy: Secondary | ICD-10-CM | POA: Diagnosis not present

## 2020-08-13 DIAGNOSIS — Z3A38 38 weeks gestation of pregnancy: Secondary | ICD-10-CM | POA: Diagnosis not present

## 2020-08-13 DIAGNOSIS — O0990 Supervision of high risk pregnancy, unspecified, unspecified trimester: Secondary | ICD-10-CM

## 2020-08-13 DIAGNOSIS — O10919 Unspecified pre-existing hypertension complicating pregnancy, unspecified trimester: Secondary | ICD-10-CM

## 2020-08-13 LAB — POCT URINALYSIS DIPSTICK OB
Blood, UA: NEGATIVE
Glucose, UA: NEGATIVE
Ketones, UA: NEGATIVE
Nitrite, UA: NEGATIVE
POC,PROTEIN,UA: NEGATIVE

## 2020-08-13 MED ORDER — PANTOPRAZOLE SODIUM 40 MG PO TBEC: 40.0000 mg | DELAYED_RELEASE_TABLET | Freq: Every day | ORAL | 1 refills | Status: DC

## 2020-08-13 NOTE — Progress Notes (Signed)
[place] 03+8 wks,cephalic,posterior fundal placenta gr 2,fhr 140 bpm,BPP 8/8,RI .57,.56,.53,.51=73%,AFI 11 cm

## 2020-08-13 NOTE — Progress Notes (Signed)
HIGH-RISK PREGNANCY VISIT Patient name: Melissa Livingston MRN [id number]  Date of birth: 10/31/1996 Chief Complaint:   High Risk Gestation ([place] today; trouble sleeping, + pressure; BP high last night; + vomiting and diarrhea; + headache)  History of Present Illness:   Yuliana GEORGEANA OERTEL is a 24 y.o. G11P2002 female at [redacted]w[redacted]d with an Estimated Date of Delivery: 09/01/20 being seen today for ongoing management of a high-risk pregnancy complicated by [facility] no meds.  Today she reports no complaints.  Depression screen [facility]: Lambert Rhonda W 2/9 05/28/2020 02/22/2020 06/05/2019 01/03/2018 11/03/2016  Decreased Interest 0 0 1 0 0  Down, Depressed, Hopeless 0 0 0 0 0  PHQ - 2 Score 0 0 1 0 0  Altered sleeping 2 1 - - -  Tired, decreased energy 1 2 - - -  Change in appetite 1 3 - - -  Feeling bad or failure about yourself  0 0 - - -  Trouble concentrating 0 0 - - -  Moving slowly or fidgety/restless 0 0 - - -  Suicidal thoughts 0 0 - - -  PHQ-9 Score 4 6 - - -  Difficult doing work/chores Not difficult at all - - - -  Some recent data might be hidden    Contractions: Regular. Vag. Bleeding: None.  Movement: Present. denies leaking of fluid.  Review of Systems:   Pertinent items are noted in HPI Denies abnormal vaginal discharge w/ itching/odor/irritation, headaches, visual changes, shortness of breath, chest pain, abdominal pain, severe nausea/vomiting, or problems with urination or bowel movements unless otherwise stated above. Pertinent History Reviewed:  Reviewed past medical,surgical, social, obstetrical and family history.  Reviewed problem list, medications and allergies. Physical Assessment:   Vitals:   08/13/20 1152  BP: 123/81  Pulse: (!) 107  Weight: 224 lb (101.6 kg)  Body mass index is 39.68 kg/m.           Physical Examination:   General appearance: alert, well appearing, and in no distress  Mental status: alert, oriented to person, place, and time  Skin: warm & dry   Extremities: Edema: Trace      Cardiovascular: normal heart rate noted  Respiratory: normal respiratory effort, no distress  Abdomen: gravid, soft, non-tender  Pelvic: Cervical exam performed         Fetal Status:     Movement: Present    Fetal Surveillance Testing today: BPP 8/8   Chaperone:     Results for orders placed or performed in visit on 08/13/20 (from the past 24 hour(s))  POC Urinalysis Dipstick OB   Collection Time: 08/13/20 11:56 AM  Result Value Ref Range   Color, UA     Clarity, UA     Glucose, UA Negative Negative   Bilirubin, UA     Ketones, UA neg    Spec Grav, UA     Blood, UA neg    pH, UA     POC,PROTEIN,UA Negative Negative, Trace, Small (1+), Moderate (2+), Large (3+), 4+   Urobilinogen, UA     Nitrite, UA neg    Leukocytes, UA Trace (A) Negative   Appearance     Odor      Assessment & Plan:  1) High-risk pregnancy G3P2002 at [redacted]w[redacted]d with an Estimated Date of Delivery: 09/01/20   2) CHTN no meds, BP still normal,   3) previous C section x 2 repeat 08/1920, stable  Meds: No orders of the defined types were placed in this encounter.   Labs/procedures today:  sonogram  Treatment Plan:  Already scheduled appts, repeat section 08/25/20  Reviewed: Preterm labor symptoms and general obstetric precautions including but not limited to vaginal bleeding, contractions, leaking of fluid and fetal movement were reviewed in detail with the patient.  All questions were answered. Has home bp cuff. Rx faxed to . Check bp weekly, let us know if >140/90.   Follow-up: No follow-ups on file.  Orders Placed This Encounter  Procedures  . POC Urinalysis Dipstick OB   Florian Buff 08/13/2020 12:13 PM

## 2020-08-16 ENCOUNTER — Ambulatory Visit (INDEPENDENT_AMBULATORY_CARE_PROVIDER_SITE_OTHER): Payer: PRIVATE HEALTH INSURANCE | Admitting: *Deleted

## 2020-08-16 ENCOUNTER — Encounter (HOSPITAL_COMMUNITY): Payer: Self-pay

## 2020-08-16 ENCOUNTER — Other Ambulatory Visit: Payer: Self-pay | Admitting: Obstetrics & Gynecology

## 2020-08-16 VITALS — BP 146/85 | HR 131 | Wt 223.0 lb

## 2020-08-16 DIAGNOSIS — O10913 Unspecified pre-existing hypertension complicating pregnancy, third trimester: Secondary | ICD-10-CM

## 2020-08-16 DIAGNOSIS — O10919 Unspecified pre-existing hypertension complicating pregnancy, unspecified trimester: Secondary | ICD-10-CM

## 2020-08-16 DIAGNOSIS — Z1389 Encounter for screening for other disorder: Secondary | ICD-10-CM

## 2020-08-16 DIAGNOSIS — Z3A38 38 weeks gestation of pregnancy: Secondary | ICD-10-CM | POA: Diagnosis not present

## 2020-08-16 DIAGNOSIS — O288 Other abnormal findings on antenatal screening of mother: Secondary | ICD-10-CM

## 2020-08-16 DIAGNOSIS — O0990 Supervision of high risk pregnancy, unspecified, unspecified trimester: Secondary | ICD-10-CM

## 2020-08-16 DIAGNOSIS — Z331 Pregnant state, incidental: Secondary | ICD-10-CM

## 2020-08-16 LAB — POCT URINALYSIS DIPSTICK OB
Blood, UA: NEGATIVE
Glucose, UA: NEGATIVE
Ketones, UA: NEGATIVE
Leukocytes, UA: NEGATIVE
Nitrite, UA: NEGATIVE

## 2020-08-16 NOTE — Patient Instructions (Signed)
[place]  08/16/2020   Your procedure is scheduled on:  08/25/2020  Arrive at [place] at [employer] [address] on [address] at [facility] [address]  and [address]. You are invited to use the FREE valet parking or use the Visitor's parking deck.  Pick up the phone at the desk and dial [phone].  Call this number if you have problems the morning of surgery: [phone]  Remember:   Do not eat food:(After Midnight) Desps de medianoche.  Do not drink clear liquids: (After Midnight) Desps de medianoche.  Take these medicines the morning of surgery with A SIP OF WATER:  none   Do not wear jewelry, make-up or nail polish.  Do not wear lotions, powders, or perfumes. Do not wear deodorant.  Do not shave 48 hours prior to surgery.  Do not bring valuables to the hospital.  [facility] is not   responsible for any belongings or valuables brought to the hospital.  Contacts, dentures or bridgework may not be worn into surgery.  Leave suitcase in the car. After surgery it may be brought to your room.  For patients admitted to the hospital, checkout time is 11:00 AM the day of              discharge.      Please read over the following fact sheets that you were given:     Preparing for Surgery

## 2020-08-19 NOTE — Progress Notes (Addendum)
   NURSE VISIT- NST  SUBJECTIVE:  Melissa Livingston is a 24 y.o. G49P2002 female at [redacted]w[redacted]d, here for a NST for pregnancy complicated by [facility].  She reports active fetal movement, contractions: none, vaginal bleeding: none, membranes: intact.   OBJECTIVE:  BP (!) 146/85   Pulse (!) 131   Wt 223 lb (101.2 kg)   LMP 11/26/2019   BMI 39.50 kg/m   Appears well, no apparent distress  No results found for this or any previous visit (from the past 24 hour(s)).  NST: FHR baseline 145 bpm, Variability: moderate, Accelerations:present, Decelerations:  Absent= Cat 1/Reactive Toco: none   ASSESSMENT: G3P2002 at [redacted]w[redacted]d with CHTN NST reactive  PLAN: EFM strip reviewed by Dr. Elonda Husky   Recommendations: keep next appointment as scheduled    Alice Rieger  08/19/2020 3:11 PM  Attestation of Attending Supervision of Nursing Visit Encounter: Evaluation and management procedures were performed by the nursing staff under my supervision and collaboration.  I have reviewed the nurse's note and chart, and I agree with the management and plan.  Jacelyn Grip MD Attending Physician for the Center for [facility] 09/16/2020 3:43 PM

## 2020-08-20 ENCOUNTER — Other Ambulatory Visit: Payer: Self-pay

## 2020-08-20 ENCOUNTER — Encounter: Payer: Self-pay | Admitting: Obstetrics & Gynecology

## 2020-08-20 ENCOUNTER — Ambulatory Visit (INDEPENDENT_AMBULATORY_CARE_PROVIDER_SITE_OTHER): Payer: PRIVATE HEALTH INSURANCE

## 2020-08-20 ENCOUNTER — Ambulatory Visit (INDEPENDENT_AMBULATORY_CARE_PROVIDER_SITE_OTHER): Payer: PRIVATE HEALTH INSURANCE | Admitting: Obstetrics & Gynecology

## 2020-08-20 VITALS — BP 135/87 | HR 106 | Wt 225.0 lb

## 2020-08-20 DIAGNOSIS — Z331 Pregnant state, incidental: Secondary | ICD-10-CM

## 2020-08-20 DIAGNOSIS — Z3483 Encounter for supervision of other normal pregnancy, third trimester: Secondary | ICD-10-CM | POA: Diagnosis not present

## 2020-08-20 DIAGNOSIS — O0993 Supervision of high risk pregnancy, unspecified, third trimester: Secondary | ICD-10-CM | POA: Diagnosis not present

## 2020-08-20 DIAGNOSIS — Z3A38 38 weeks gestation of pregnancy: Secondary | ICD-10-CM

## 2020-08-20 DIAGNOSIS — Z1389 Encounter for screening for other disorder: Secondary | ICD-10-CM | POA: Diagnosis not present

## 2020-08-20 DIAGNOSIS — O10919 Unspecified pre-existing hypertension complicating pregnancy, unspecified trimester: Secondary | ICD-10-CM

## 2020-08-20 DIAGNOSIS — O10913 Unspecified pre-existing hypertension complicating pregnancy, third trimester: Secondary | ICD-10-CM | POA: Diagnosis not present

## 2020-08-20 DIAGNOSIS — O0990 Supervision of high risk pregnancy, unspecified, unspecified trimester: Secondary | ICD-10-CM

## 2020-08-20 LAB — POCT URINALYSIS DIPSTICK OB
Blood, UA: NEGATIVE
Glucose, UA: NEGATIVE
Ketones, UA: NEGATIVE
Leukocytes, UA: NEGATIVE
Nitrite, UA: NEGATIVE
POC,PROTEIN,UA: NEGATIVE

## 2020-08-20 NOTE — Progress Notes (Signed)
HIGH-RISK PREGNANCY VISIT Patient name: Melissa Livingston  Date of birth: May 11, 1996 Chief Complaint:   Routine Prenatal Visit ([place] today)  History of Present Illness:   Melissa Livingston is a 24 y.o. G67P2002 female at [redacted]w[redacted]d with an Estimated Date of Delivery: 09/01/20 being seen today for ongoing management of a high-risk pregnancy complicated by chronic hypertension currently on no meds.  Today she reports no complaints.  Depression screen [facility] 2/9 05/28/2020 02/22/2020 06/05/2019 01/03/2018 11/03/2016  Decreased Interest 0 0 1 0 0  Down, Depressed, Hopeless 0 0 0 0 0  PHQ - 2 Score 0 0 1 0 0  Altered sleeping 2 1 - - -  Tired, decreased energy 1 2 - - -  Change in appetite 1 3 - - -  Feeling bad or failure about yourself  0 0 - - -  Trouble concentrating 0 0 - - -  Moving slowly or fidgety/restless 0 0 - - -  Suicidal thoughts 0 0 - - -  PHQ-9 Score 4 6 - - -  Difficult doing work/chores Not difficult at all - - - -  Some recent data might be hidden    Contractions: Irregular. Vag. Bleeding: None.  Movement: Present. denies leaking of fluid.  Review of Systems:   Pertinent items are noted in HPI Denies abnormal vaginal discharge w/ itching/odor/irritation, headaches, visual changes, shortness of breath, chest pain, abdominal pain, severe nausea/vomiting, or problems with urination or bowel movements unless otherwise stated above. Pertinent History Reviewed:  Reviewed past medical,surgical, social, obstetrical and family history.  Reviewed problem list, medications and allergies. Physical Assessment:   Vitals:   08/20/20 1146  BP: 135/87  Pulse: (!) 106  Weight: 225 lb (102.1 kg)  Body mass index is 39.86 kg/m.           Physical Examination:   General appearance: alert, well appearing, and in no distress  Mental status: alert, oriented to person, place, and time  Skin: warm & dry   Extremities: Edema: Trace    Cardiovascular: normal heart rate noted  Respiratory: normal  respiratory effort, no distress  Abdomen: gravid, soft, non-tender  Pelvic: Cervical exam deferred         Fetal Status:     Movement: Present    Fetal Surveillance Testing today: BPP 8/8 40% Dopplers   Chaperone: n/a    Results for orders placed or performed in visit on 08/20/20 (from the past 24 hour(s))  POC Urinalysis Dipstick OB   Collection Time: 08/20/20 11:52 AM  Result Value Ref Range   Color, UA     Clarity, UA     Glucose, UA Negative Negative   Bilirubin, UA     Ketones, UA neg    Spec Grav, UA     Blood, UA neg    pH, UA     POC,PROTEIN,UA Negative Negative, Trace, Small (1+), Moderate (2+), Large (3+), 4+   Urobilinogen, UA     Nitrite, UA neg    Leukocytes, UA Negative Negative   Appearance     Odor      Assessment & Plan:  1) High-risk pregnancy G3P2002 at [redacted]w[redacted]d with an Estimated Date of Delivery: 09/01/20   2) CHTN no meds,   3) Previous C section x 2 repeat this week  Meds: No orders of the defined types were placed in this encounter.   Labs/procedures today:   Treatment Plan:  NST 3 days, repeat C section 08/25/20  Reviewed:  Term labor symptoms and general obstetric precautions including but not limited to vaginal bleeding, contractions, leaking of fluid and fetal movement were reviewed in detail with the patient.  All questions were answered.  home bp cuff. Rx faxed to . Check bp weekly, let us know if >140/90.   Follow-up: Return in about 3 days (around 08/23/2020) for NST.  Orders Placed This Encounter  Procedures  . POC Urinalysis Dipstick OB   Florian Buff  08/20/2020 12:16 PM

## 2020-08-20 NOTE — Progress Notes (Signed)
[place] 24+4 wks,cephalic,BPP 6/2,MMN 817 bpm,fundal placenta gr 2,AFI 11.7 cm,RI .59,.54,.52,.49=40%

## 2020-08-21 ENCOUNTER — Encounter: Payer: Self-pay | Admitting: *Deleted

## 2020-08-23 ENCOUNTER — Other Ambulatory Visit: Payer: Self-pay

## 2020-08-23 ENCOUNTER — Ambulatory Visit (INDEPENDENT_AMBULATORY_CARE_PROVIDER_SITE_OTHER): Payer: PRIVATE HEALTH INSURANCE | Admitting: *Deleted

## 2020-08-23 ENCOUNTER — Other Ambulatory Visit (HOSPITAL_COMMUNITY)
Admission: RE | Admit: 2020-08-23 | Discharge: 2020-08-23 | Disposition: A | Discharge status: Home / Self Care | Payer: PRIVATE HEALTH INSURANCE | Source: Ambulatory Visit | Attending: Obstetrics & Gynecology | Admitting: Obstetrics & Gynecology

## 2020-08-23 ENCOUNTER — Encounter (HOSPITAL_COMMUNITY): Payer: Self-pay | Admitting: Obstetrics & Gynecology

## 2020-08-23 VITALS — BP 134/82 | HR 100 | Wt 226.0 lb

## 2020-08-23 DIAGNOSIS — O10919 Unspecified pre-existing hypertension complicating pregnancy, unspecified trimester: Secondary | ICD-10-CM

## 2020-08-23 DIAGNOSIS — Z1389 Encounter for screening for other disorder: Secondary | ICD-10-CM

## 2020-08-23 DIAGNOSIS — Z3A38 38 weeks gestation of pregnancy: Secondary | ICD-10-CM | POA: Diagnosis not present

## 2020-08-23 DIAGNOSIS — Z01812 Encounter for preprocedural laboratory examination: Secondary | ICD-10-CM | POA: Insufficient documentation

## 2020-08-23 DIAGNOSIS — Z331 Pregnant state, incidental: Secondary | ICD-10-CM

## 2020-08-23 DIAGNOSIS — O0990 Supervision of high risk pregnancy, unspecified, unspecified trimester: Secondary | ICD-10-CM | POA: Diagnosis not present

## 2020-08-23 LAB — TYPE AND SCREEN
ABO/RH(D): O POS
Antibody Screen: NEGATIVE

## 2020-08-23 LAB — POCT URINALYSIS DIPSTICK OB
Blood, UA: NEGATIVE
Glucose, UA: NEGATIVE
Ketones, UA: NEGATIVE
Leukocytes, UA: NEGATIVE
Nitrite, UA: NEGATIVE
POC,PROTEIN,UA: NEGATIVE

## 2020-08-23 LAB — CBC
HCT: 29.8 % — ABNORMAL LOW (ref 36.0–46.0)
Hemoglobin: 8.7 g/dL — ABNORMAL LOW (ref 12.0–15.0)
MCH: 20 pg — ABNORMAL LOW (ref 26.0–34.0)
MCHC: 29.2 g/dL — ABNORMAL LOW (ref 30.0–36.0)
MCV: 68.3 fL — ABNORMAL LOW (ref 80.0–100.0)
Platelets: 274 10*3/uL (ref 150–400)
RBC: 4.36 MIL/uL (ref 3.87–5.11)
RDW: 19.5 % — ABNORMAL HIGH (ref 11.5–15.5)
WBC: 8.9 10*3/uL (ref 4.0–10.5)
nRBC: 0 % (ref 0.0–0.2)

## 2020-08-23 LAB — RPR: RPR Ser Ql: NONREACTIVE

## 2020-08-23 NOTE — Progress Notes (Addendum)
Presents for pre-op blood work only.  Does not need Covid 19 test secondary +Covid on July 20, 2020.  Pre-op instructions and body wash given.

## 2020-08-23 NOTE — Anesthesia Preprocedure Evaluation (Addendum)
Anesthesia Evaluation  Patient identified by MRN, date of birth, ID band Patient awake    Reviewed: Allergy & Precautions, NPO status , Patient's Chart, lab work & pertinent test results, reviewed documented beta blocker date and time   Airway Mallampati: II  TM Distance: >3 FB Neck ROM: Full    Dental no notable dental hx. (+) Teeth Intact   Pulmonary asthma , pneumonia, resolved,    Pulmonary exam normal breath sounds clear to auscultation       Cardiovascular hypertension, Pt. on medications Normal cardiovascular exam Rhythm:Regular Rate:Normal     Neuro/Psych  Headaches, PSYCHIATRIC DISORDERS Anxiety Depression    GI/Hepatic Neg liver ROS, GERD  Medicated and Controlled,IBS Hx/o malrotation of gut   Endo/Other  Morbid obesity  Renal/GU negative Renal ROS  negative genitourinary   Musculoskeletal Left sciatica   Abdominal (+) + obese,   Peds  Hematology  (+) anemia ,   Anesthesia Other Findings   Reproductive/Obstetrics (+) Pregnancy Previous C/section x 2                            Anesthesia Physical Anesthesia Plan  ASA: III  Anesthesia Plan: Spinal   Post-op Pain Management:    Induction:   PONV Risk Score and Plan: 4 or greater and Scopolamine patch - Pre-op  Airway Management Planned: Natural Airway  Additional Equipment:   Intra-op Plan:   Post-operative Plan:   Informed Consent: I have reviewed the patients History and Physical, chart, labs and discussed the procedure including the risks, benefits and alternatives for the proposed anesthesia with the patient or authorized representative who has indicated his/her understanding and acceptance.     Dental advisory given  Plan Discussed with: CRNA and Anesthesiologist  Anesthesia Plan Comments:        Anesthesia Quick Evaluation

## 2020-08-23 NOTE — Progress Notes (Addendum)
   NURSE VISIT- NST  SUBJECTIVE:  Melissa Livingston is a 24 y.o. G64P2002 female at [redacted]w[redacted]d, here for a NST for pregnancy complicated by [facility].  She reports active fetal movement, contractions: none, vaginal bleeding: none, membranes: intact.   OBJECTIVE:  BP 134/82   Pulse 100   Wt 226 lb (102.5 kg)   LMP 11/26/2019   BMI 40.03 kg/m   Appears well, no apparent distress  Results for orders placed or performed in visit on 08/23/20 (from the past 24 hour(s))  POC Urinalysis Dipstick OB   Collection Time: 08/23/20 12:27 PM  Result Value Ref Range   Color, UA     Clarity, UA     Glucose, UA Negative Negative   Bilirubin, UA     Ketones, UA neg    Spec Grav, UA     Blood, UA neg    pH, UA     POC,PROTEIN,UA Negative Negative, Trace, Small (1+), Moderate (2+), Large (3+), 4+   Urobilinogen, UA     Nitrite, UA neg    Leukocytes, UA Negative Negative   Appearance     Odor    Results for orders placed or performed during the hospital encounter of 08/23/20 (from the past 24 hour(s))  CBC   Collection Time: 08/23/20  8:27 AM  Result Value Ref Range   WBC 8.9 4.0 - 10.5 K/uL   RBC 4.36 3.87 - 5.11 MIL/uL   Hemoglobin 8.7 (L) 12.0 - 15.0 g/dL   HCT 29.8 (L) 36 - 46 %   MCV 68.3 (L) 80.0 - 100.0 fL   MCH 20.0 (L) 26.0 - 34.0 pg   MCHC 29.2 (L) 30.0 - 36.0 g/dL   RDW 19.5 (H) 11.5 - 15.5 %   Platelets 274 150 - 400 K/uL   nRBC 0.0 0.0 - 0.2 %  RPR   Collection Time: 08/23/20  8:27 AM  Result Value Ref Range   RPR Ser Ql NON REACTIVE NON REACTIVE  Type and screen [place]   Collection Time: 08/23/20  8:27 AM  Result Value Ref Range   ABO/RH(D) O POS    Antibody Screen NEG    Sample Expiration      08/26/2020,2359 Performed at [facility], [address], [place], [place] [address]     NST: FHR baseline 135 bpm, Variability: moderate, Accelerations:present, Decelerations:  Absent= Cat 1/Reactive Toco: none   ASSESSMENT: G3P2002 at [redacted]w[redacted]d with CHTN NST  reactive  PLAN: EFM strip reviewed by Dr. Elonda Husky   Recommendations: keep next appointment as scheduled    Alice Rieger  08/23/2020 12:29 PM   Attestation of Attending Supervision of Nursing Visit Encounter: Evaluation and management procedures were performed by the nursing staff under my supervision and collaboration.  I have reviewed the nurse's note and chart, and I agree with the management and plan.  Jacelyn Grip MD Attending Physician for the Center for [facility] 09/25/2020 8:39 PM

## 2020-08-25 ENCOUNTER — Inpatient Hospital Stay (HOSPITAL_COMMUNITY): Payer: PRIVATE HEALTH INSURANCE | Admitting: Anesthesiology

## 2020-08-25 ENCOUNTER — Other Ambulatory Visit: Payer: Self-pay

## 2020-08-25 ENCOUNTER — Encounter (HOSPITAL_COMMUNITY): Payer: Self-pay | Admitting: Obstetrics & Gynecology

## 2020-08-25 ENCOUNTER — Encounter (HOSPITAL_COMMUNITY)
Admission: RE | Disposition: A | Discharge status: Home / Self Care | Payer: Self-pay | Source: Home / Self Care | Attending: Obstetrics & Gynecology

## 2020-08-25 ENCOUNTER — Inpatient Hospital Stay (HOSPITAL_COMMUNITY)
Admission: RE | Admit: 2020-08-25 | Discharge: 2020-08-26 | DRG: 787 | Disposition: A | Discharge status: Home / Self Care | Payer: PRIVATE HEALTH INSURANCE | Attending: Obstetrics & Gynecology | Admitting: Obstetrics & Gynecology

## 2020-08-25 ENCOUNTER — Other Ambulatory Visit: Payer: Self-pay | Admitting: Obstetrics & Gynecology

## 2020-08-25 DIAGNOSIS — O9952 Diseases of the respiratory system complicating childbirth: Secondary | ICD-10-CM | POA: Diagnosis present

## 2020-08-25 DIAGNOSIS — O34211 Maternal care for low transverse scar from previous cesarean delivery: Principal | ICD-10-CM | POA: Diagnosis present

## 2020-08-25 DIAGNOSIS — I10 Essential (primary) hypertension: Secondary | ICD-10-CM | POA: Diagnosis present

## 2020-08-25 DIAGNOSIS — O99214 Obesity complicating childbirth: Secondary | ICD-10-CM | POA: Diagnosis present

## 2020-08-25 DIAGNOSIS — Z8759 Personal history of other complications of pregnancy, childbirth and the puerperium: Secondary | ICD-10-CM

## 2020-08-25 DIAGNOSIS — O9852 Other viral diseases complicating childbirth: Secondary | ICD-10-CM | POA: Diagnosis not present

## 2020-08-25 DIAGNOSIS — Z98891 History of uterine scar from previous surgery: Secondary | ICD-10-CM

## 2020-08-25 DIAGNOSIS — O9902 Anemia complicating childbirth: Secondary | ICD-10-CM | POA: Diagnosis not present

## 2020-08-25 DIAGNOSIS — Z3A39 39 weeks gestation of pregnancy: Secondary | ICD-10-CM | POA: Diagnosis not present

## 2020-08-25 DIAGNOSIS — U071 COVID-19: Secondary | ICD-10-CM | POA: Diagnosis not present

## 2020-08-25 DIAGNOSIS — O1002 Pre-existing essential hypertension complicating childbirth: Secondary | ICD-10-CM | POA: Diagnosis present

## 2020-08-25 DIAGNOSIS — Z30017 Encounter for initial prescription of implantable subdermal contraceptive: Secondary | ICD-10-CM | POA: Diagnosis not present

## 2020-08-25 DIAGNOSIS — O34219 Maternal care for unspecified type scar from previous cesarean delivery: Secondary | ICD-10-CM | POA: Diagnosis not present

## 2020-08-25 DIAGNOSIS — Z8616 Personal history of COVID-19: Secondary | ICD-10-CM

## 2020-08-25 DIAGNOSIS — J45909 Unspecified asthma, uncomplicated: Secondary | ICD-10-CM | POA: Diagnosis not present

## 2020-08-25 DIAGNOSIS — D649 Anemia, unspecified: Secondary | ICD-10-CM | POA: Diagnosis present

## 2020-08-25 DIAGNOSIS — J1282 Pneumonia due to coronavirus disease 2019: Secondary | ICD-10-CM | POA: Diagnosis not present

## 2020-08-25 DIAGNOSIS — F418 Other specified anxiety disorders: Secondary | ICD-10-CM | POA: Diagnosis present

## 2020-08-25 SURGERY — Surgical Case
Anesthesia: Spinal | Wound class: Clean Contaminated

## 2020-08-25 MED ORDER — FENTANYL CITRATE (PF) 100 MCG/2ML IJ SOLN: 25.0000 ug | INTRAMUSCULAR | Status: DC | PRN

## 2020-08-25 MED ORDER — LACTATED RINGERS IV SOLN: INTRAVENOUS | Status: DC

## 2020-08-25 MED ORDER — SCOPOLAMINE 1 MG/3DAYS TD PT72
1.0000 | MEDICATED_PATCH | TRANSDERMAL | Status: DC
  Administered 2020-08-25: 1.5 mg via TRANSDERMAL

## 2020-08-25 MED ORDER — ACETAMINOPHEN 500 MG PO TABS
1000.0000 mg | ORAL_TABLET | Freq: Three times a day (TID) | ORAL | Status: DC
  Administered 2020-08-25 – 2020-08-26 (×4): 1000 mg via ORAL
  Filled 2020-08-25 (×4): qty 2

## 2020-08-25 MED ORDER — ONDANSETRON HCL 4 MG/2ML IJ SOLN
INTRAMUSCULAR | Status: DC | PRN
  Administered 2020-08-25: 4 mg via INTRAVENOUS

## 2020-08-25 MED ORDER — NALBUPHINE HCL 10 MG/ML IJ SOLN
5.0000 mg | Freq: Once | INTRAMUSCULAR | Status: AC | PRN
  Administered 2020-08-25: 5 mg via INTRAVENOUS

## 2020-08-25 MED ORDER — FENTANYL CITRATE (PF) 100 MCG/2ML IJ SOLN
INTRAMUSCULAR | Status: AC
  Filled 2020-08-25: qty 2

## 2020-08-25 MED ORDER — PRENATAL MULTIVITAMIN CH
1.0000 | ORAL_TABLET | Freq: Every day | ORAL | Status: DC
  Administered 2020-08-25 – 2020-08-26 (×2): 1 via ORAL
  Filled 2020-08-25 (×2): qty 1

## 2020-08-25 MED ORDER — OXYTOCIN-SODIUM CHLORIDE 30-0.9 UT/500ML-% IV SOLN
INTRAVENOUS | Status: DC | PRN
  Administered 2020-08-25: 30 [IU] via INTRAVENOUS

## 2020-08-25 MED ORDER — NALBUPHINE HCL 10 MG/ML IJ SOLN: 5.0000 mg | INTRAMUSCULAR | Status: DC | PRN

## 2020-08-25 MED ORDER — FENTANYL CITRATE (PF) 100 MCG/2ML IJ SOLN
INTRAMUSCULAR | Status: DC | PRN
Start: 2020-08-25 — End: 2020-08-25
  Administered 2020-08-25: 15 ug via INTRATHECAL

## 2020-08-25 MED ORDER — CEFAZOLIN SODIUM-DEXTROSE 2-4 GM/100ML-% IV SOLN
INTRAVENOUS | Status: AC
  Filled 2020-08-25: qty 100

## 2020-08-25 MED ORDER — SOD CITRATE-CITRIC ACID 500-334 MG/5ML PO SOLN
ORAL | Status: AC
  Filled 2020-08-25: qty 30

## 2020-08-25 MED ORDER — KETOROLAC TROMETHAMINE 30 MG/ML IJ SOLN: 30.0000 mg | Freq: Four times a day (QID) | INTRAMUSCULAR | Status: AC | PRN

## 2020-08-25 MED ORDER — SENNOSIDES-DOCUSATE SODIUM 8.6-50 MG PO TABS
2.0000 | ORAL_TABLET | ORAL | Status: DC
  Administered 2020-08-25: 2 via ORAL
  Filled 2020-08-25: qty 2

## 2020-08-25 MED ORDER — POVIDONE-IODINE 10 % EX SWAB
2.0000 "application " | Freq: Once | CUTANEOUS | Status: AC
  Administered 2020-08-25: 2 via TOPICAL

## 2020-08-25 MED ORDER — LACTATED RINGERS IV SOLN: INTRAVENOUS | Status: DC | PRN

## 2020-08-25 MED ORDER — MORPHINE SULFATE (PF) 0.5 MG/ML IJ SOLN
INTRAMUSCULAR | Status: AC
  Filled 2020-08-25: qty 10

## 2020-08-25 MED ORDER — CEFAZOLIN SODIUM-DEXTROSE 2-4 GM/100ML-% IV SOLN
2.0000 g | INTRAVENOUS | Status: AC
  Administered 2020-08-25: 2 g via INTRAVENOUS

## 2020-08-25 MED ORDER — DIPHENHYDRAMINE HCL 25 MG PO CAPS: 25.0000 mg | ORAL_CAPSULE | ORAL | Status: DC | PRN

## 2020-08-25 MED ORDER — COCONUT OIL OIL: 1.0000 "application " | TOPICAL_OIL | Status: DC | PRN

## 2020-08-25 MED ORDER — MORPHINE SULFATE (PF) 0.5 MG/ML IJ SOLN
INTRAMUSCULAR | Status: DC | PRN
Start: 2020-08-25 — End: 2020-08-25
  Administered 2020-08-25: .15 mg via INTRATHECAL

## 2020-08-25 MED ORDER — SIMETHICONE 80 MG PO CHEW: 80.0000 mg | CHEWABLE_TABLET | ORAL | Status: DC | PRN

## 2020-08-25 MED ORDER — ZOLPIDEM TARTRATE 5 MG PO TABS: 5.0000 mg | ORAL_TABLET | Freq: Every evening | ORAL | Status: DC | PRN

## 2020-08-25 MED ORDER — ENOXAPARIN SODIUM 60 MG/0.6ML ~~LOC~~ SOLN
0.5000 mg/kg | SUBCUTANEOUS | Status: DC
  Administered 2020-08-25: 52.5 mg via SUBCUTANEOUS
  Filled 2020-08-25: qty 0.6

## 2020-08-25 MED ORDER — NALOXONE HCL 4 MG/10ML IJ SOLN
1.0000 ug/kg/h | INTRAVENOUS | Status: DC | PRN
  Filled 2020-08-25: qty 5

## 2020-08-25 MED ORDER — PHENYLEPHRINE HCL-NACL 20-0.9 MG/250ML-% IV SOLN
INTRAVENOUS | Status: DC | PRN
  Administered 2020-08-25: 60 ug/min via INTRAVENOUS

## 2020-08-25 MED ORDER — DIPHENHYDRAMINE HCL 25 MG PO CAPS: 25.0000 mg | ORAL_CAPSULE | Freq: Four times a day (QID) | ORAL | Status: DC | PRN

## 2020-08-25 MED ORDER — KETOROLAC TROMETHAMINE 30 MG/ML IJ SOLN
30.0000 mg | Freq: Four times a day (QID) | INTRAMUSCULAR | Status: AC | PRN
  Administered 2020-08-25 – 2020-08-26 (×2): 30 mg via INTRAVENOUS
  Filled 2020-08-25 (×2): qty 1

## 2020-08-25 MED ORDER — PHENYLEPHRINE HCL-NACL 20-0.9 MG/250ML-% IV SOLN
INTRAVENOUS | Status: AC
  Filled 2020-08-25: qty 250

## 2020-08-25 MED ORDER — SOD CITRATE-CITRIC ACID 500-334 MG/5ML PO SOLN
30.0000 mL | Freq: Once | ORAL | Status: AC
  Administered 2020-08-25: 30 mL via ORAL

## 2020-08-25 MED ORDER — NALBUPHINE HCL 10 MG/ML IJ SOLN
5.0000 mg | Freq: Once | INTRAMUSCULAR | Status: AC | PRN
  Filled 2020-08-25: qty 1

## 2020-08-25 MED ORDER — ONDANSETRON HCL 4 MG/2ML IJ SOLN: 4.0000 mg | Freq: Three times a day (TID) | INTRAMUSCULAR | Status: DC | PRN

## 2020-08-25 MED ORDER — TETANUS-DIPHTH-ACELL PERTUSSIS 5-2.5-18.5 LF-MCG/0.5 IM SUSP: 0.5000 mL | Freq: Once | INTRAMUSCULAR | Status: DC

## 2020-08-25 MED ORDER — SIMETHICONE 80 MG PO CHEW
80.0000 mg | CHEWABLE_TABLET | Freq: Three times a day (TID) | ORAL | Status: DC
  Administered 2020-08-25 – 2020-08-26 (×4): 80 mg via ORAL
  Filled 2020-08-25 (×4): qty 1

## 2020-08-25 MED ORDER — MEPERIDINE HCL 25 MG/ML IJ SOLN: 6.2500 mg | INTRAMUSCULAR | Status: DC | PRN

## 2020-08-25 MED ORDER — NALBUPHINE HCL 10 MG/ML IJ SOLN
5.0000 mg | INTRAMUSCULAR | Status: DC | PRN
  Administered 2020-08-25: 5 mg via INTRAVENOUS
  Filled 2020-08-25: qty 1

## 2020-08-25 MED ORDER — IBUPROFEN 800 MG PO TABS
800.0000 mg | ORAL_TABLET | Freq: Three times a day (TID) | ORAL | Status: DC
  Administered 2020-08-25 – 2020-08-26 (×2): 800 mg via ORAL
  Filled 2020-08-25 (×2): qty 1

## 2020-08-25 MED ORDER — SIMETHICONE 80 MG PO CHEW
80.0000 mg | CHEWABLE_TABLET | ORAL | Status: DC
  Administered 2020-08-25: 80 mg via ORAL
  Filled 2020-08-25: qty 1

## 2020-08-25 MED ORDER — SODIUM CHLORIDE 0.9 % IR SOLN
Status: DC | PRN
  Administered 2020-08-25: 1

## 2020-08-25 MED ORDER — OXYTOCIN-SODIUM CHLORIDE 30-0.9 UT/500ML-% IV SOLN
2.5000 [IU]/h | INTRAVENOUS | Status: AC
  Administered 2020-08-25: 2.5 [IU]/h via INTRAVENOUS

## 2020-08-25 MED ORDER — OXYCODONE HCL 5 MG PO TABS: 5.0000 mg | ORAL_TABLET | ORAL | Status: DC | PRN

## 2020-08-25 MED ORDER — BUPIVACAINE IN DEXTROSE 0.75-8.25 % IT SOLN
INTRATHECAL | Status: DC | PRN
  Administered 2020-08-25: 1.6 mL via INTRATHECAL

## 2020-08-25 MED ORDER — NALOXONE HCL 0.4 MG/ML IJ SOLN: 0.4000 mg | INTRAMUSCULAR | Status: DC | PRN

## 2020-08-25 MED ORDER — SCOPOLAMINE 1 MG/3DAYS TD PT72
MEDICATED_PATCH | TRANSDERMAL | Status: AC
  Filled 2020-08-25: qty 1

## 2020-08-25 MED ORDER — OXYTOCIN-SODIUM CHLORIDE 30-0.9 UT/500ML-% IV SOLN
INTRAVENOUS | Status: AC
  Filled 2020-08-25: qty 500

## 2020-08-25 MED ORDER — DIBUCAINE (PERIANAL) 1 % EX OINT: 1.0000 "application " | TOPICAL_OINTMENT | CUTANEOUS | Status: DC | PRN

## 2020-08-25 MED ORDER — WITCH HAZEL-GLYCERIN EX PADS: 1.0000 "application " | MEDICATED_PAD | CUTANEOUS | Status: DC | PRN

## 2020-08-25 MED ORDER — DIPHENHYDRAMINE HCL 50 MG/ML IJ SOLN
12.5000 mg | INTRAMUSCULAR | Status: DC | PRN
  Administered 2020-08-25: 12.5 mg via INTRAVENOUS

## 2020-08-25 MED ORDER — DEXAMETHASONE SODIUM PHOSPHATE 4 MG/ML IJ SOLN
INTRAMUSCULAR | Status: DC | PRN
  Administered 2020-08-25: 4 mg via INTRAVENOUS

## 2020-08-25 MED ORDER — ONDANSETRON HCL 4 MG/2ML IJ SOLN
INTRAMUSCULAR | Status: AC
  Filled 2020-08-25: qty 2

## 2020-08-25 MED ORDER — MENTHOL 3 MG MT LOZG: 1.0000 | LOZENGE | OROMUCOSAL | Status: DC | PRN

## 2020-08-25 MED ORDER — GABAPENTIN 100 MG PO CAPS
300.0000 mg | ORAL_CAPSULE | Freq: Two times a day (BID) | ORAL | Status: DC
  Administered 2020-08-25 – 2020-08-26 (×2): 300 mg via ORAL
  Filled 2020-08-25 (×2): qty 3

## 2020-08-25 MED ORDER — SODIUM CHLORIDE 0.9% FLUSH: 3.0000 mL | INTRAVENOUS | Status: DC | PRN

## 2020-08-25 MED ORDER — DIPHENHYDRAMINE HCL 50 MG/ML IJ SOLN
INTRAMUSCULAR | Status: AC
  Filled 2020-08-25: qty 1

## 2020-08-25 MED ORDER — DEXAMETHASONE SODIUM PHOSPHATE 4 MG/ML IJ SOLN
INTRAMUSCULAR | Status: AC
  Filled 2020-08-25: qty 1

## 2020-08-25 SURGICAL SUPPLY — 40 items
ADH SKN CLS APL DERMABOND .7 (GAUZE/BANDAGES/DRESSINGS) ×1
CHLORAPREP W/TINT 26ML (MISCELLANEOUS) ×2 IMPLANT
CLAMP CORD UMBIL (MISCELLANEOUS) IMPLANT
CLOTH BEACON ORANGE TIMEOUT ST (SAFETY) ×2 IMPLANT
DERMABOND ADVANCED (GAUZE/BANDAGES/DRESSINGS) ×1
DERMABOND ADVANCED .7 DNX12 (GAUZE/BANDAGES/DRESSINGS) ×2 IMPLANT
DRSG OPSITE POSTOP 4X10 (GAUZE/BANDAGES/DRESSINGS) ×2 IMPLANT
ELECT REM PT RETURN 9FT ADLT (ELECTROSURGICAL) ×2
ELECTRODE REM PT RTRN 9FT ADLT (ELECTROSURGICAL) ×1 IMPLANT
EXTRACTOR VACUUM BELL STYLE (SUCTIONS) ×1 IMPLANT
GLOVE BIOGEL PI IND STRL 7.0 (GLOVE) ×1 IMPLANT
GLOVE BIOGEL PI IND STRL 8 (GLOVE) ×1 IMPLANT
GLOVE BIOGEL PI INDICATOR 7.0 (GLOVE) ×1
GLOVE BIOGEL PI INDICATOR 8 (GLOVE) ×1
GLOVE ECLIPSE 8.0 STRL XLNG CF (GLOVE) ×2 IMPLANT
GOWN STRL REUS W/TWL LRG LVL3 (GOWN DISPOSABLE) ×4 IMPLANT
KIT ABG SYR 3ML LUER SLIP (SYRINGE) ×2 IMPLANT
NDL HYPO 18GX1.5 BLUNT FILL (NEEDLE) ×1 IMPLANT
NDL HYPO 25X5/8 SAFETYGLIDE (NEEDLE) ×1 IMPLANT
NEEDLE HYPO 18GX1.5 BLUNT FILL (NEEDLE) ×2 IMPLANT
NEEDLE HYPO 22GX1.5 SAFETY (NEEDLE) ×2 IMPLANT
NEEDLE HYPO 25X5/8 SAFETYGLIDE (NEEDLE) ×2 IMPLANT
NS IRRIG 1000ML POUR BTL (IV SOLUTION) ×2 IMPLANT
PACK C SECTION WH (CUSTOM PROCEDURE TRAY) ×2 IMPLANT
PAD OB MATERNITY 4.3X12.25 (PERSONAL CARE ITEMS) ×2 IMPLANT
PENCIL SMOKE EVAC W/HOLSTER (ELECTROSURGICAL) ×2 IMPLANT
RTRCTR C-SECT PINK 25CM LRG (MISCELLANEOUS) IMPLANT
SUT CHROMIC 0 CT 1 (SUTURE) ×2 IMPLANT
SUT MNCRL 0 VIOLET CTX 36 (SUTURE) ×2 IMPLANT
SUT MONOCRYL 0 CTX 36 (SUTURE) ×6
SUT PLAIN 2 0 (SUTURE)
SUT PLAIN 2 0 XLH (SUTURE) IMPLANT
SUT PLAIN ABS 2-0 CT1 27XMFL (SUTURE) IMPLANT
SUT VIC AB 0 CTX 36 (SUTURE) ×2
SUT VIC AB 0 CTX36XBRD ANBCTRL (SUTURE) ×1 IMPLANT
SUT VIC AB 4-0 KS 27 (SUTURE) IMPLANT
SYR 20CC LL (SYRINGE) ×4 IMPLANT
TOWEL OR 17X24 6PK STRL BLUE (TOWEL DISPOSABLE) ×2 IMPLANT
TRAY FOLEY W/BAG SLVR 14FR LF (SET/KITS/TRAYS/PACK) IMPLANT
WATER STERILE IRR 1000ML POUR (IV SOLUTION) ×2 IMPLANT

## 2020-08-25 NOTE — Op Note (Signed)
Cesarean Section Procedure Note   Melissa Livingston  08/25/2020  Indications: History of Cesarean x2   Pre-operative Diagnosis: repeat cesarean section.   Post-operative Diagnosis: Repeat Cesarean delivery, Vacuum Assisted  Surgeon: Surgeon(s) and Role:    * Eure, Mertie Clause, MD - Primary        Guillermina City, MD  Anesthesia: spinal    Estimated Blood Loss: 25 mL   Total IV Fluids: 1600 ml   Urine Output: 200 ml  Specimens: none  Baby condition / location:  Couplet care / Skin to Skin 3314 gm APGAR: 9 & 10  Complications: no complications  Indications: Melissa Livingston is a 24 y.o. P5V7482 with an IUP [redacted]w[redacted]d presenting for scheduled repeat Cesarean given history of prior Cesarean x2.  The risks, benefits, complications, treatment options, and expected outcomes were discussed with the patient . The patient concurred with the proposed plan, giving informed consent. identified as [name] and the procedure verified as C-Section Delivery.  Procedure Details: A Time Out was held and the above information confirmed.  The patient was taken back to the operative suite where spinal anesthesia was placed.  After induction of anesthesia, the patient was draped and prepped in the usual sterile manner and placed in a dorsal supine position with a leftward tilt. A transverse elliptical was made and carried down through the subcutaneous tissue to the fascia, along the prior incision. Fascial incision was made and extended transversely. The fascia was separated from the underlying rectus tissue superiorly and inferiorly. The peritoneum was identified and entered. Peritoneal incision was extended longitudinally. Ubaldo Glassing was placed. A low transverse uterine incision was then made. Delivered from cephalic presentation with assistance of vacuum was a 3314 gram Living newborn, female infant with Apgar scores of 9 at one minute and 10 at five minutes. Of note, 1 pop-off with vacuum occurred prior to delivery. Cord  ph was not sent the umbilical cord was clamped and cut cord blood was obtained for evaluation. The placenta was removed Intact and appeared normal. The uterine outline, tubes and ovaries appeared normal. The uterine incision was closed with running locked sutures of 0Vicryl.   Hemostasis was observed. Lavage was carried out until clear. The fascia was then reapproximated with running sutures of 0 Vicryl. The skin was closed with 3-0 Vicryl.   Instrument, sponge, and needle counts were correct prior the abdominal closure and were correct at the conclusion of the case.   Disposition: PACU - hemodynamically stable.   Maternal Condition: stable   Signed: Randa Ngo, MD 08/25/2020 9:33 AM

## 2020-08-25 NOTE — Anesthesia Postprocedure Evaluation (Signed)
Anesthesia Post Note  Patient: Melissa Livingston  Procedure(s) Performed: CESAREAN SECTION (N/A )     Patient location during evaluation: PACU Anesthesia Type: Spinal Level of consciousness: oriented and awake and alert Pain management: pain level controlled Vital Signs Assessment: post-procedure vital signs reviewed and stable Respiratory status: spontaneous breathing, respiratory function stable and nonlabored ventilation Cardiovascular status: blood pressure returned to baseline and stable Postop Assessment: no headache, no backache, no apparent nausea or vomiting, spinal receding and patient able to bend at knees Anesthetic complications: no   No complications documented.  Last Vitals:  Vitals:   08/25/20 0915 08/25/20 0930  BP: 120/82 125/85  Pulse: 91 78  Resp: 11 11  Temp:    SpO2: 99% 97%    Last Pain:  Vitals:   08/25/20 0905  TempSrc: Oral  PainSc:    Pain Goal:    LLE Motor Response: No movement due to regional block (08/25/20 0930)   RLE Motor Response: No movement due to regional block (08/25/20 0930)       Epidural/Spinal Function Cutaneous sensation: Tingles (08/25/20 0930), Patient able to flex knees: No (08/25/20 0930), Patient able to lift hips off bed: No (08/25/20 0930), Back pain beyond tenderness at insertion site: No (08/25/20 0930), Progressively worsening motor and/or sensory loss: No (08/25/20 0930), Bowel and/or bladder incontinence post epidural: No (08/25/20 0930)  Macaria Bias A.

## 2020-08-25 NOTE — Transfer of Care (Signed)
Immediate Anesthesia Transfer of Care Note  Patient: Melissa Livingston  Procedure(s) Performed: CESAREAN SECTION (N/A )  Patient Location: PACU  Anesthesia Type:Spinal  Level of Consciousness: awake, alert  and oriented  Airway & Oxygen Therapy: Patient Spontanous Breathing and Patient connected to nasal cannula oxygen  Post-op Assessment: Report given to RN and Post -op Vital signs reviewed and stable  Post vital signs: Reviewed and stable  Last Vitals:  Vitals Value Taken Time  BP 124/76 08/25/20 0906  Temp    Pulse 97 08/25/20 0907  Resp 19 08/25/20 0907  SpO2 100 % 08/25/20 0907  Vitals shown include unvalidated device data.  Last Pain:  Vitals:   08/25/20 0613  TempSrc: Oral  PainSc: 0-No pain         Complications: No complications documented.

## 2020-08-25 NOTE — H&P (Signed)
Subjective:  Melissa Livingston is a 24 y.o. [id number] Estimated Date of Delivery: 09/01/20 [redacted]w[redacted]d admitted for a repeat Caesarean section, no tubal Objective:   Vital signs in last 24 hours: Temp:  [98.7 F (37.1 C)] 98.7 F (37.1 C) (09/19 0349) Pulse Rate:  [95] 95 (09/19 0613) Resp:  [20] 20 (09/19 0613) BP: (128)/(78) 128/78 (09/19 0613) SpO2:  [100 %] 100 % (09/19 0613) Weight:  [102.5 kg] 102.5 kg (09/19 1791)    Prenatal Transfer Tool  Maternal Diabetes: No Genetic Screening: Normal Maternal Ultrasounds/Referrals: Normal Fetal Ultrasounds or other Referrals:  None Maternal Substance Abuse:  No Significant Maternal Medications:  None Significant Maternal Lab Results: +COVID in August   Physical Exam  Vitals reviewed. Constitutional: She is oriented to person, place, and time. She appears well-developed and well-nourished.  HENT:  Head: Normocephalic and atraumatic.  Right Ear: External ear normal.  Left Ear: External ear normal.  Nose: Nose normal.  Mouth/Throat: Oropharynx is clear and moist.  Eyes: Conjunctivae and EOM are normal. Pupils are equal, round, and reactive to light. Right eye exhibits no discharge. Left eye exhibits no discharge. No scleral icterus.  Neck: Normal range of motion. Neck supple. No tracheal deviation present. No thyromegaly present.  Cardiovascular: Normal rate, regular rhythm, normal heart sounds and intact distal pulses.  Exam reveals no gallop and no friction rub.   No murmur heard. Respiratory: Effort normal and breath sounds normal. No respiratory distress. She has no wheezes. She has no rales. She exhibits no tenderness.  GI: Soft. Bowel sounds are normal. She exhibits no distension and no mass. There is tenderness. There is no rebound and no guarding.  Genitourinary:       Vulva is normal without lesions Vagina is pink moist without discharge Cervix normal in appearance and pap is normal Uterus is size consistent with term weeks Adnexa is  negative with normal sized ovaries by sonogram  Musculoskeletal: Normal range of motion. She exhibits no edema and no tenderness.  Neurological: She is alert and oriented to person, place, and time. She has normal reflexes. She displays normal reflexes. No cranial nerve deficit. She exhibits normal muscle tone. Coordination normal.  Skin: Skin is warm and dry. No rash noted. No erythema. No pallor.  Psychiatric: She has a normal mood and affect. Her behavior is normal. Judgment and thought content normal.    Lab Review  O pos AFP:NML  One hour GTT: Normal    Assessment/Plan:   [redacted]w[redacted]d  weeks gestation. Not in labor. Obstetrical history significant for prior c-section.    Reepat C section

## 2020-08-25 NOTE — Anesthesia Procedure Notes (Signed)
Spinal  Start time: 08/25/2020 7:24 AM End time: 08/25/2020 7:30 AM

## 2020-08-26 DIAGNOSIS — Z30017 Encounter for initial prescription of implantable subdermal contraceptive: Secondary | ICD-10-CM

## 2020-08-26 LAB — CBC
HCT: 24.1 % — ABNORMAL LOW (ref 36.0–46.0)
Hemoglobin: 7 g/dL — ABNORMAL LOW (ref 12.0–15.0)
MCH: 20.3 pg — ABNORMAL LOW (ref 26.0–34.0)
MCHC: 29 g/dL — ABNORMAL LOW (ref 30.0–36.0)
MCV: 70.1 fL — ABNORMAL LOW (ref 80.0–100.0)
Platelets: 237 10*3/uL (ref 150–400)
RBC: 3.44 MIL/uL — ABNORMAL LOW (ref 3.87–5.11)
RDW: 19.9 % — ABNORMAL HIGH (ref 11.5–15.5)
WBC: 13.3 10*3/uL — ABNORMAL HIGH (ref 4.0–10.5)
nRBC: 0.2 % (ref 0.0–0.2)

## 2020-08-26 MED ORDER — ETONOGESTREL 68 MG ~~LOC~~ IMPL
68.0000 mg | DRUG_IMPLANT | Freq: Once | SUBCUTANEOUS | Status: AC
  Administered 2020-08-26: 68 mg via SUBCUTANEOUS
  Filled 2020-08-26: qty 1

## 2020-08-26 MED ORDER — IBUPROFEN 800 MG PO TABS
800.0000 mg | ORAL_TABLET | Freq: Three times a day (TID) | ORAL | 0 refills | Status: DC
Start: 2020-08-26 — End: 2021-03-16

## 2020-08-26 MED ORDER — LIDOCAINE HCL 1 % IJ SOLN
0.0000 mL | Freq: Once | INTRAMUSCULAR | Status: AC | PRN
  Administered 2020-08-26: 20 mL via INTRADERMAL
  Filled 2020-08-26: qty 20

## 2020-08-26 MED ORDER — ACETAMINOPHEN 500 MG PO TABS: 1000.0000 mg | ORAL_TABLET | Freq: Four times a day (QID) | ORAL | 0 refills | Status: AC | PRN

## 2020-08-26 MED ORDER — SODIUM CHLORIDE 0.9 % IV SOLN
500.0000 mg | Freq: Once | INTRAVENOUS | Status: AC
  Administered 2020-08-26: 500 mg via INTRAVENOUS
  Filled 2020-08-26 (×3): qty 25

## 2020-08-26 NOTE — Discharge Summary (Signed)
Postpartum Discharge Summary  Date of Service updated 08/26/20     Patient Name: Melissa Livingston DOB: 06/10/1996 MRN: [id number]  Date of admission: 08/25/2020 Delivery date:08/25/2020  Delivering provider: Florian Buff  Date of discharge: 08/26/2020  Admitting diagnosis: Cesarean delivery delivered [O82] Intrauterine pregnancy: [redacted]w[redacted]d    Secondary diagnosis:  Principal Problem:   Cesarean delivery delivered Active Problems:   Asthma   Depression   History of gestational hypertension   Normocytic anemia   S/P cesarean section   Chronic hypertension affecting pregnancy  Additional problems: as noted above   Discharge diagnosis: Cesarean delivery delivered                                           Post partum procedures:none Augmentation: N/A Complications: None  Hospital course: Sceduled C/S   24y.o. yo G3P3003 at 347w0das admitted to the hospital 08/25/2020 for scheduled cesarean section with the following indication:h/o Cesarean x2.Delivery details are as follows:  Membrane Rupture Time/Date: 8:04 AM ,08/25/2020   Delivery Method:C-Section, Vacuum Assisted  Details of operation can be found in separate operative note.  Patient had an uncomplicated postpartum course.  She is ambulating, tolerating a regular diet, passing flatus, and urinating well. Patient is discharged home in stable condition on  08/26/20        Newborn Data: Birth date:08/25/2020  Birth time:8:07 AM  Gender:Female  Living status:Living  Apgars:9 ,10  Weight:3314 g     Magnesium Sulfate received: No BMZ received: No Rhophylac:N/A MMR:N/A T-DaP:Given prenatally Flu: N/A Transfusion:No  Physical exam  Vitals:   08/25/20 2138 08/26/20 0225 08/26/20 0523 08/26/20 0810  BP: 120/80 105/62 109/67 117/66  Pulse: 70 (!) 107 77 80  Resp: 18 17 18 18   Temp: 97.7 F (36.5 C) 97.7 F (36.5 C) 98.1 F (36.7 C) 98.2 F (36.8 C)  TempSrc: Oral Oral Oral   SpO2: 99% 100% 100% 100%  Weight:       Height:       General: alert, cooperative and no distress Lochia: appropriate Uterine Fundus: firm Incision: Dressing is clean, dry, and intact DVT Evaluation: No evidence of DVT seen on physical exam. Labs: Lab Results  Component Value Date   WBC 13.3 (H) 08/26/2020   HGB 7.0 (L) 08/26/2020   HCT 24.1 (L) 08/26/2020   MCV 70.1 (L) 08/26/2020   PLT 237 08/26/2020   CMP Latest Ref Rng & Units 07/27/2020  Glucose 70 - 99 mg/dL 82  BUN 6 - 20 mg/dL 5(L)  Creatinine 0.44 - 1.00 mg/dL 0.74  Sodium 135 - 145 mmol/L 138  Potassium 3.5 - 5.1 mmol/L 3.9  Chloride 98 - 111 mmol/L 109  CO2 22 - 32 mmol/L 12(L)  Calcium 8.9 - 10.3 mg/dL 8.6(L)  Total Protein 6.5 - 8.1 g/dL 6.1(L)  Total Bilirubin 0.3 - 1.2 mg/dL 1.4(H)  Alkaline Phos 38 - 126 U/L 167(H)  AST 15 - 41 U/L 37  ALT 0 - 44 U/L 29   Edinburgh Score: Edinburgh Postnatal Depression Scale Screening Tool 08/25/2020  I have been able to laugh and see the funny side of things. (No Data)     After visit meds:  Allergies as of 08/26/2020      Reactions   Adhesive [tape] Other (See Comments)   Blisters    Lorabid [loracarbef] Hives, Swelling, Other (See Comments)  Mouth swelling Can take amoxil and augmentin    Latex Itching, Rash   Orange Fruit [citrus] Rash      Medication List    STOP taking these medications   aspirin EC 81 MG tablet   guaiFENesin-dextromethorphan 100-10 MG/5ML syrup Commonly known as: ROBITUSSIN DM     TAKE these medications   acetaminophen 500 MG tablet Commonly known as: TYLENOL Take 2 tablets (1,000 mg total) by mouth every 6 (six) hours as needed for mild pain, moderate pain or headache. What changed: medication strength   ascorbic acid 1000 MG tablet Commonly known as: VITAMIN C Take 1 tablet (1,000 mg total) by mouth 2 (two) times daily.   B & B Carpal Tunnel Brace Misc 2 Units by Does not apply route at bedtime. Right and left   Cepacol Regular Strength 3 MG lozenge Generic  drug: menthol-cetylpyridinium Take 1 lozenge (3 mg total) by mouth as needed for sore throat.   cyclobenzaprine 10 MG tablet Commonly known as: FLEXERIL Take 1 tablet (10 mg total) by mouth every 8 (eight) hours as needed for muscle spasms.   ferrous sulfate 325 (65 FE) MG tablet Take 1 tablet (325 mg total) by mouth 2 (two) times daily with a meal.   ibuprofen 800 MG tablet Commonly known as: ADVIL Take 1 tablet (800 mg total) by mouth every 8 (eight) hours.   multivitamin-prenatal 27-0.8 MG Tabs tablet Take 1 tablet by mouth daily at 12 noon.   omeprazole 20 MG capsule Commonly known as: PriLOSEC Take 1 capsule (20 mg total) by mouth daily.   pantoprazole 40 MG tablet Commonly known as: Protonix Take 1 tablet (40 mg total) by mouth daily.   Vitamin D (Ergocalciferol) 1.25 MG (50000 UNIT) Caps capsule Commonly known as: DRISDOL Take 1 capsule (50,000 Units total) by mouth every 7 (seven) days.   zinc sulfate 220 (50 Zn) MG capsule Take 1 capsule (220 mg total) by mouth daily.        Discharge home in stable condition Infant Feeding: Breast Infant Disposition:home with mother Discharge instruction: per After Visit Summary and Postpartum booklet. Activity: Advance as tolerated. Pelvic rest for 6 weeks.  Diet: routine diet Future Appointments:No future appointments. Follow up Visit:   Please schedule this patient for a In person postpartum visit in 4 weeks with the following provider: Any provider. Additional Postpartum F/U:Incision check 1 week, BP check 1 week, mood check 1 week (h/o depression) High risk pregnancy complicated by: gHTN, h/o Cesarean x2 Delivery mode:  C-Section, Vacuum Assisted  Anticipated Birth Control:  PP Nexplanon placed   8/41/6606 Arrie Senate, MD

## 2020-08-26 NOTE — Lactation Note (Signed)
This note was copied from a baby's chart. Lactation Consultation Note Baby 70 hrs old. Baby has only been to breast after delivery. Mom keeps asking for formula. Mom didn't BF her other 2 children, stated they wouldn't latch.  Mom has short shaft everted nipples. Colostrum easily expressed. Praised mom. Encouraged mom to call for assistance in feeding/latching. Mom encouraged to feed baby 8-12 times/24 hours and with feeding cues. Mom encouraged to waken baby for feedings if hasn't cued in 3 hrs.  Newborn feeding habits, STS, I&O, breast massage supply and demand discussed. Encouraged to call for assistance or questions. Lactation brochure given.  Patient Name: Melissa Livingston Today's Date: 08/26/2020 Reason for consult: Initial assessment;Term;1st time breastfeeding   Maternal Data Has patient been taught Hand Expression?: Yes Does the patient have breastfeeding experience prior to this delivery?: No (mom stated they wouldn't latch)  Feeding Feeding Type: Bottle Fed - Formula Nipple Type: Slow - flow  LATCH Score       Type of Nipple: Everted at rest and after stimulation (short shaft compressible)  Comfort (Breast/Nipple): Soft / non-tender        Interventions Interventions: Breast feeding basics reviewed;Breast compression;Hand express;Breast massage  Lactation Tools Discussed/Used WIC Program: Yes   Consult Status Consult Status: Follow-up Date: 08/27/20 Follow-up type: In-patient    [place] Taber, [place]ta [place] 08/26/2020, 2:11 AM

## 2020-08-26 NOTE — Progress Notes (Deleted)
Subjective: Postpartum Day 1: Cesarean Delivery Patient reports tolerating PO and no problems voiding. Up and moving. No BM yet, has not passed gas. Denies abdominal pain, nausea or vomiting.  Objective: Vital signs in last 24 hours: Temp:  [97.6 F (36.4 C)-98.4 F (36.9 C)] 98.1 F (36.7 C) (09/20 0523) Pulse Rate:  [70-107] 77 (09/20 0523) Resp:  [11-18] 18 (09/20 0523) BP: (105-139)/(62-85) 109/67 (09/20 0523) SpO2:  [97 %-100 %] 100 % (09/20 0523)  Physical Exam:  General: alert, cooperative, appears stated age and no distress Lochia: appropriate Uterine Fundus: firm Incision: healing well, no significant drainage, no significant erythema DVT Evaluation: No evidence of DVT seen on physical exam. No significant calf/ankle edema.  Recent Labs    08/23/20 0827 08/26/20 0536  HGB 8.7* 7.0*  HCT 29.8* 24.1*    Assessment/Plan: POD#1 from repeat C/S. -  Doing well postoperatively.  - Post-op Hgb 8.7>7. Will give 500 Venofer - Desires inpatient Nexplanon for contraception.  Sharion Settler 08/26/2020, 8:00 AM

## 2020-08-26 NOTE — Discharge Instructions (Signed)

## 2020-08-26 NOTE — Procedures (Signed)
Post-Placental Nexplanon Insertion Procedure Note  Patient was identified. Informed consent was signed, signed copy in chart. A time-out was performed.    The insertion site was identified 8-10 cm (3-4 inches) from the medial epicondyle of the humerus and 3-5 cm (1.25-2 inches) posterior to (below) the sulcus (groove) between the biceps and triceps muscles of the patient's right arm and marked. The site was prepped and draped in the usual sterile fashion. Pt was prepped with alcohol swab and then injected with 10 cc of 1% lidocaine. Nexplanon removed form packaging,  Device confirmed in needle, then inserted full length of needle and withdrawn per handbook instructions. Provider and patient verified presence of the implant in the woman's arm by palpation. Pt insertion site was covered with gauze and pressure bandage. There was minimal blood loss. Patient tolerated procedure well.  Patient was given post procedure instructions and Nexplanon user card with expiration date. Condoms were recommended for STI prevention. Patient was asked to keep the pressure dressing on for 24 hours to minimize bruising and keep the adhesive bandage on for 3-5 days. The patient verbalized understanding of the plan of care and agrees.   Arrie Senate, MD OB Fellow, [place] [facility] 08/26/2020 2:50 PM

## 2020-08-26 NOTE — Social Work (Signed)
MOB was referred for history of anxiety.  * Referral screened out by Clinical Social Worker because none of the following criteria appear to apply:  ~ History of anxiety/depression during this pregnancy, or of post-partum depression. ~ Diagnosis of anxiety and/or depression within last 3 years. MOB first received medication to treat in 2014.   OR * MOB's symptoms currently being treated with medication and/or therapy.  Please contact the Clinical Social Worker if needs arise, by [facility] request, or if MOB scores greater than 9/yes to question 10 on Edinburgh Postpartum Depression Screen.  Darra Lis, [place] Worker Women[facility] and [employer]

## 2020-08-26 NOTE — Lactation Note (Signed)
This note was copied from a baby's chart. Lactation Consultation Note  Patient Name: Melissa Livingston Today's Date: 08/26/2020   [name] now 96 hours old.  Mom reports she will not latch.  Infant has also had pacifier and bottles.  Discussed pacifier and bottle use while establishing breastfeeding.  Infant content on arrival.  Mom reports recently took formula.  Asked mom if we could try and latch her now that she is calm.  Mom agreed.  Assisted with latching infant.  Infant is fussy at the breast and hard to latch.  Opens/cues/but will not latch.  Showed mom how to hand express and roll out nipple and after a few attempts infant latched.  Infant breastfed for about 5 minutes on moms right breast.  Showed mom how to use manual pump to help evert nipple to possibly make latching easier.  Infant fell asleep.   Discussed intitating pumping with DEBP. Mom reports she used pump in the hospital with her last baby who would not latch.    Mom in agreement.  Tech came in to do hearing screen.  Once infant had hearing screen and diaper changed started cuing again. Had brought everything to initiate pumping with mom.  Assist with latching infant on right breast again in laid back breastfeeding position.  Again, infant fussy and hard to latch. Finally latched.  Mom smiled and reported comfort.  Left mom and baby breastfeeding.  Urged her to call when done to initiate pumping with DEBP.  Praised breastfeeding efforts.     Maternal Data    Feeding Feeding Type: Formula Nipple Type: Slow - flow  LATCH Score                   Interventions    Lactation Tools Discussed/Used     Consult Status      Melissa Livingston 08/26/2020, 1:39 PM

## 2020-08-27 ENCOUNTER — Other Ambulatory Visit: Payer: PRIVATE HEALTH INSURANCE

## 2020-08-27 ENCOUNTER — Encounter: Payer: PRIVATE HEALTH INSURANCE | Admitting: Women's Health

## 2020-08-27 ENCOUNTER — Telehealth: Payer: Self-pay | Admitting: *Deleted

## 2020-08-27 NOTE — Telephone Encounter (Addendum)
Transition Care Management Unsuccessful Follow-up Telephone Call  Date of discharge and from where:  08/26/2020 [facility]  Attempts:  1st Attempt  Reason for unsuccessful TCM follow-up call:  Left voice message

## 2020-08-28 NOTE — Telephone Encounter (Signed)
Transition Care Management Follow-up Telephone Call  Date of discharge and from where: 08/26/2020 [facility]  How have you been since you were released from the hospital? "Doing well"  Any questions or concerns? No  Items Reviewed:  Did the pt receive and understand the discharge instructions provided? Yes   Medications obtained and verified? Yes   Any new allergies since your discharge? Yes   Dietary orders reviewed? Yes  Do you have support at home? Yes   Functional Questionnaire: (I = Independent and D = Dependent) ADLs: I  Bathing/Dressing- I  Meal Prep- I  Eating- I  Maintaining continence- I  Transferring/Ambulation- I  Managing Meds- I  Follow up appointments reviewed:   PCP Hospital f/u appt confirmed? No  - PEC Team to address  [facility] f/u appt confirmed? Yes  Scheduled to see OB on 09/02/2020 @ 1150.  Are transportation arrangements needed? No   If their condition worsens, is the pt aware to call PCP or go to the Emergency Dept.? Yes  Was the patient provided with contact information for the PCP's office or ED? Yes  Was to pt encouraged to call back with questions or concerns? Yes

## 2020-08-29 ENCOUNTER — Encounter (HOSPITAL_COMMUNITY): Payer: Self-pay | Admitting: Obstetrics & Gynecology

## 2020-08-30 ENCOUNTER — Other Ambulatory Visit: Payer: PRIVATE HEALTH INSURANCE

## 2020-09-02 ENCOUNTER — Encounter: Payer: PRIVATE HEALTH INSURANCE | Admitting: Women's Health

## 2020-09-05 ENCOUNTER — Encounter: Payer: Self-pay | Admitting: Women's Health

## 2020-09-05 ENCOUNTER — Ambulatory Visit (INDEPENDENT_AMBULATORY_CARE_PROVIDER_SITE_OTHER): Payer: PRIVATE HEALTH INSURANCE | Admitting: Women's Health

## 2020-09-05 VITALS — BP 135/78 | HR 90 | Ht 63.0 in | Wt 203.2 lb

## 2020-09-05 DIAGNOSIS — Z4889 Encounter for other specified surgical aftercare: Secondary | ICD-10-CM | POA: Diagnosis not present

## 2020-09-05 NOTE — Progress Notes (Signed)
   GYN VISIT Patient name: Melissa Livingston MRN [id number]  Date of birth: March 28, 1996 Chief Complaint:   Routine Post Op  History of Present Illness:   Melissa Livingston is a 24 y.o. G53P3003 African American female 11 days s/p RCS being seen today for incision check. Doing ok. Not sleeping much b/c baby is up a lot. Sister does come over and let her sleep some. Some feelings of anxiety and feeling overwhelmed, no depression. Feels she is coping well on her own, declines needs for meds. EPDS 0 today. Bottlefeeding. Got Nexplanon in hospital.     Depression screen [facility] 2/9 05/28/2020 02/22/2020 06/05/2019 01/03/2018 11/03/2016  Decreased Interest 0 0 1 0 0  Down, Depressed, Hopeless 0 0 0 0 0  PHQ - 2 Score 0 0 1 0 0  Altered sleeping 2 1 - - -  Tired, decreased energy 1 2 - - -  Change in appetite 1 3 - - -  Feeling bad or failure about yourself  0 0 - - -  Trouble concentrating 0 0 - - -  Moving slowly or fidgety/restless 0 0 - - -  Suicidal thoughts 0 0 - - -  PHQ-9 Score 4 6 - - -  Difficult doing work/chores Not difficult at all - - - -  Some recent data might be hidden    Patient's last menstrual period was 11/26/2019. The current method of family planning is abstinence and Nexplanon.  Review of Systems:   Pertinent items are noted in HPI Denies fever/chills, dizziness, headaches, visual disturbances, fatigue, shortness of breath, chest pain, abdominal pain, vomiting, abnormal vaginal discharge/itching/odor/irritation, problems with periods, bowel movements, urination, or intercourse unless otherwise stated above.  Pertinent History Reviewed:  Reviewed past medical,surgical, social, obstetrical and family history.  Reviewed problem list, medications and allergies. Physical Assessment:   Vitals:   09/05/20 0937  BP: 135/78  Pulse: 90  Weight: 203 lb 3.2 oz (92.2 kg)  Height: 5\' 3"  (1.6 m)  Body mass index is 36 kg/m.       Physical Examination:   General appearance: alert, well  appearing, and in no distress  Mental status: alert, oriented to person, place, and time  Skin: warm & dry   Cardiovascular: normal heart rate noted  Respiratory: normal respiratory effort, no distress  Abdomen: soft, non-tender, incision healing well   Pelvic: examination not indicated  Extremities: no edema   Chaperone: n/a    No results found for this or any previous visit (from the past 24 hour(s)).  Assessment & Plan:  1) 11d s/p RCS> c/s incision healing well  2) Anxiety> feeling overwhelmed, sleep deprived. Coping well right now, declines meds. Have sister come over when she can to help.   Meds: No orders of the defined types were placed in this encounter.   No orders of the defined types were placed in this encounter.   Return for As scheduled 10/25 for pp visit.  [place], [facility] 09/05/2020 10:08 AM

## 2020-09-11 DIAGNOSIS — Z029 Encounter for administrative examinations, unspecified: Secondary | ICD-10-CM

## 2020-09-30 ENCOUNTER — Ambulatory Visit (INDEPENDENT_AMBULATORY_CARE_PROVIDER_SITE_OTHER): Payer: PRIVATE HEALTH INSURANCE | Admitting: Women's Health

## 2020-09-30 ENCOUNTER — Other Ambulatory Visit: Payer: Self-pay

## 2020-09-30 ENCOUNTER — Encounter: Payer: Self-pay | Admitting: Women's Health

## 2020-09-30 DIAGNOSIS — O1093 Unspecified pre-existing hypertension complicating the puerperium: Secondary | ICD-10-CM

## 2020-09-30 DIAGNOSIS — Z1332 Encounter for screening for maternal depression: Secondary | ICD-10-CM

## 2020-09-30 DIAGNOSIS — I10 Essential (primary) hypertension: Secondary | ICD-10-CM

## 2020-09-30 DIAGNOSIS — Z98891 History of uterine scar from previous surgery: Secondary | ICD-10-CM

## 2020-09-30 DIAGNOSIS — Z975 Presence of (intrauterine) contraceptive device: Secondary | ICD-10-CM

## 2020-09-30 NOTE — Progress Notes (Signed)
POSTPARTUM VISIT Patient name: Melissa Livingston MRN [id number]  Date of birth: October 29, 1996 Chief Complaint:   Postpartum Care  History of Present Illness:   Melissa Livingston is a 24 y.o. G53P3003 African American female being seen today for a postpartum visit. She is 5 weeks postpartum following a repeat cesarean section, low transverse incision at 39.0 gestational weeks. IOL: No, for n/a. Anesthesia: spinal.  Laceration: n/a.  Complications: none. Inpatient contraception: yes Nexplanon inserted 08/26/20.   Pregnancy complicated by [facility] dx during pregnancy @ 16wks, on labetalol during pregnancy.  Covid pneumonia w/ hospitalization x 3d at 34wks.  Tobacco use: no. Substance use disorder: no. Last pap smear: 06/05/19 and results were normal. Next pap smear due: 2023 No LMP recorded.  Postpartum course has been uncomplicated. Bleeding on period now. Bowel function is normal. Bladder function is normal. Urinary incontinence? No, fecal incontinence? No Patient is sexually active.  Desired contraception: Nexplanon, already placed. Patient does want a pregnancy in the future.  Desired family size is 4 children.   Upstream - 09/30/20 1202      Pregnancy Intention Screening   Does the patient want to become pregnant in the next year? No    Does the patient's partner want to become pregnant in the next year? No    Would the patient like to discuss contraceptive options today? No      Contraception Wrap Up   Current Method Hormonal Implant    End Method Hormonal Implant    Contraception Counseling Provided No          The pregnancy intention screening data noted above was reviewed. Potential methods of contraception were discussed. The patient elected to proceed with Hormonal Implant.   Edinburgh Postpartum Depression Screening: negative, does have h/o dep/anx, has been on lexapro in past. Denies SI/HI/II. Decliens therapy or meds right now. Baby sleeping during day/up at night, so feels sleep deprived.  Sister helps, calls her so she can take a nap.   Edinburgh Postnatal Depression Scale - 09/30/20 1201      Edinburgh Postnatal Depression Scale:  In the Past 7 Days   I have been able to laugh and see the funny side of things. 0    I have looked forward with enjoyment to things. 0    I have blamed myself unnecessarily when things went wrong. 3    I have been anxious or worried for no good reason. 2    I have felt scared or panicky for no good reason. 0    Things have been getting on top of me. 2    I have been so unhappy that I have had difficulty sleeping. 1    I have felt sad or miserable. 0    I have been so unhappy that I have been crying. 1    The thought of harming myself has occurred to me. 0    Edinburgh Postnatal Depression Scale Total 9          Baby's course has been uncomplicated. Baby is feeding by bottle. Infant has a pediatrician/family doctor? Yes.  Childcare strategy if returning to work/school: family.  Pt has material needs met for her and baby: Yes.   Review of Systems:   Pertinent items are noted in HPI Denies Abnormal vaginal discharge w/ itching/odor/irritation, headaches, visual changes, shortness of breath, chest pain, abdominal pain, severe nausea/vomiting, or problems with urination or bowel movements. Pertinent History Reviewed:  Reviewed past medical,surgical, obstetrical and family history.  Reviewed problem list, medications and allergies. OB History  Gravida Para Term Preterm AB Living  _0 SAB TAB Ectopic Multiple Live Births        0 3    # Outcome Date GA Lbr Len/2nd Weight Sex Delivery Anes PTL Lv  3 Term 08/25/20 [redacted]w[redacted]d 7 lb 4.9 oz (3.314 kg) F CS-Vac Spinal  LIV     Birth Comments: WDL  2 Term 09/25/16 322w1d6 lb 7.7 oz (2.94 kg) F CS-Vac Spinal  LIV     Birth Comments: No gross anomalies noted  1 Term 09/29/14 3922w6d:34 / 03:58 7 lb 4.8 oz (3.311 kg) F CS-LTranv EPI N LIV     Complications: Failure to Progress in Second Stage,  Gestational hypertension   Physical Assessment:   Vitals:   09/30/20 1204  BP: 115/78  Pulse: 98  Weight: 199 lb 3.2 oz (90.4 kg)  Height: _1  (1.6 m)  Body mass index is 35.29 kg/m.       Physical Examination:   General appearance: alert, well appearing, and in no distress  Mental status: alert, oriented to person, place, and time  Skin: warm & dry   Cardiovascular: normal heart rate noted   Respiratory: normal respiratory effort, no distress   Breasts: deferred, no complaints   Abdomen: soft, non-tender, c/s incision well-healed  Pelvic: examination not indicated  Rectal: not examined   Extremities: no edema  Chaperone: N/A         No results found for this or any previous visit (from the past 24 hour(s)).  Assessment & Plan:  1) Postpartum exam 2) 5 wks s/p repeat cesarean section, low transverse incision 3) bottle feeding 4) Depression screening 5) S/P Nexplanon insertion 08/26/20  Essential components of care per ACOG recommendations:  1.  Mood and well being:  . If positive depression screen, discussed and plan developed.  . If using tobacco we discussed reduction/cessation and risk of relapse . If current substance abuse, we discussed and referral to local resources was offered.   2. Infant care and feeding:  . If breastfeeding, discussed returning to work, pumping, breastfeeding-associated pain, guidance regarding return to fertility while lactating if not using another method. If needed, patient was provided with a letter to be allowed to pump q 2-3hrs to support lactation in a private location with access to a refrigerator to store breastmilk.   . Recommended that all caregivers be immunized for flu, pertussis and other preventable communicable diseases . If pt does not have material needs met for her/baby, referred to local resources for help obtaining these.  3. Sexuality, contraception and birth spacing . Provided guidance regarding sexuality, management  of dyspareunia, and resumption of intercourse . Discussed avoiding interpregnancy interval <6mt27mand recommended birth spacing of 18 months  4. Sleep and fatigue . Discussed coping options for fatigue and sleep disruption . Encouraged family/partner/community support of 4 hrs of uninterrupted sleep to help with mood and fatigue  5. Physical recovery  . If pt had a C/S, assessed incisional pain and providing guidance on normal vs prolonged recovery . If pt had a laceration, perineal healing and pain reviewed.  . If urinary or fecal incontinence, discussed management and referred to PT or uro/gyn if indicated  . Patient is safe to resume physical activity. Discussed attainment of healthy weight.  6.  Chronic disease management . Discussed pregnancy complications if any, and their implications for future childbearing  and long-term maternal health. . Review recommendations for prevention of recurrent pregnancy complications, such as 17 hydroxyprogesterone caproate to reduce risk for recurrent PTB not applicable, or aspirin to reduce risk of preeclampsia yes. . Pt had GDM: No. If yes, 2hr GTT scheduled: not applicable. . Reviewed medications and non-pregnant dosing including consideration of whether pt is breastfeeding using a reliable resource such as LactMed: not applicable . Referred for f/u w/ PCP or subspecialist providers as indicated: not applicable  7. Health maintenance . Mammogram at 27yo or earlier if indicated . Pap smears as indicated  Meds: No orders of the defined types were placed in this encounter.   Follow-up: Return in about 1 year (around 09/30/2021) for Physical.   No orders of the defined types were placed in this encounter.   [place], [facility] 09/30/2020 12:47 PM

## 2020-10-14 ENCOUNTER — Ambulatory Visit
Admission: EM | Admit: 2020-10-14 | Discharge: 2020-10-14 | Disposition: A | Discharge status: Home / Self Care | Payer: Medicaid Other | Attending: Emergency Medicine | Admitting: Emergency Medicine

## 2020-10-14 ENCOUNTER — Other Ambulatory Visit: Payer: Self-pay

## 2020-10-14 DIAGNOSIS — R053 Chronic cough: Secondary | ICD-10-CM

## 2020-10-14 MED ORDER — BENZONATATE 100 MG PO CAPS: 100.0000 mg | ORAL_CAPSULE | Freq: Three times a day (TID) | ORAL | 0 refills | Status: DC

## 2020-10-14 MED ORDER — ALBUTEROL SULFATE HFA 108 (90 BASE) MCG/ACT IN AERS
2.0000 | INHALATION_SPRAY | Freq: Once | RESPIRATORY_TRACT | Status: AC
  Administered 2020-10-14: 2 via RESPIRATORY_TRACT

## 2020-10-14 MED ORDER — ALBUTEROL SULFATE HFA 108 (90 BASE) MCG/ACT IN AERS: 1.0000 | INHALATION_SPRAY | Freq: Four times a day (QID) | RESPIRATORY_TRACT | 0 refills | Status: DC | PRN

## 2020-10-14 MED ORDER — CETIRIZINE HCL 10 MG PO TABS: 10.0000 mg | ORAL_TABLET | Freq: Every day | ORAL | 0 refills | Status: DC

## 2020-10-14 NOTE — ED Provider Notes (Signed)
[facility]   [id number] 10/14/20 Arrival Time: 72  Cc: COUGH  SUBJECTIVE:  [place] is a 24 y.o. female who presents with dry cough x 3 months, worsening symptoms over the past 2 weeks.  Denies sick contacts.  Admits to covid infection in August.  Describes cough as intermittent and dry.  Has tried OTC medications without relief.  Denies aggravating factors.  Denies previous symptoms in the past.   Denies fever, chills, fatigue, sinus pain, rhinorrhea, sore throat, SOB, wheezing, chest pain, nausea, changes in bowel or bladder habits.    ROS: As per HPI.  All other pertinent ROS negative.     Past Medical History:  Diagnosis Date  . Anemia   . Anxiety   . Asthma    as child  . Endometritis 01/11/2015  . GERD (gastroesophageal reflux disease)   . Headache   . Hematochezia 05/08/2015  . History of ovarian cyst 01/28/2015  . IBS (irritable bowel syndrome)   . Infection    UTI  . Nausea and vomiting 01/11/2015  . Ovarian cyst 01/14/2015  . Pelvic pain in female 01/11/2015  . Pregnancy induced hypertension    with pregnancy  . Pregnant 02/05/2016  . Rectal bleeding 07/18/2013   Past Surgical History:  Procedure Laterality Date  . CESAREAN SECTION N/A 09/29/2014   Procedure: CESAREAN SECTION;  Surgeon: Jonnie Kind, MD;  Location: [facility];  Service: Obstetrics;  Laterality: N/A;  . CESAREAN SECTION N/A 09/25/2016   Procedure: CESAREAN SECTION;  Surgeon: Jonnie Kind, MD;  Location: [facility];  Service: Obstetrics;  Laterality: N/A;  . CESAREAN SECTION N/A 08/25/2020   Procedure: CESAREAN SECTION;  Surgeon: Florian Buff, MD;  Location: [facility];  Service: Obstetrics;  Laterality: N/A;  . CHOLECYSTECTOMY N/A 03/07/2018   Procedure: LAPAROSCOPIC CHOLECYSTECTOMY;  Surgeon: Aviva Signs, MD;  Location: [facility];  Service: General;  Laterality: N/A;  . COLONOSCOPY WITH PROPOFOL N/A 08/02/2017   Dr. Gala Romney: moderate internal hemorhoids, distal 5 cm of TI also appeared  normal  . ESOPHAGOGASTRODUODENOSCOPY (EGD) WITH ESOPHAGEAL DILATION N/A 07/19/2013   Dr. Rourk:normal appearing esophagus s/p dilation, normal D1 and D2, unremarkable path   . ESOPHAGOGASTRODUODENOSCOPY (EGD) WITH PROPOFOL N/A 08/02/2017   Normal esophagus, small hiatal hernia, normal duodenum  . none    . WISDOM TOOTH EXTRACTION     Allergies  Allergen Reactions  . Adhesive [Tape] Other (See Comments)    Blisters   . Lorabid [Loracarbef] Hives, Swelling and Other (See Comments)    Mouth swelling Can take amoxil and augmentin   . Latex Itching and Rash  . Orange Fruit [Citrus] Rash   No current facility-administered medications on file prior to encounter.   Current Outpatient Medications on File Prior to Encounter  Medication Sig Dispense Refill  . acetaminophen (TYLENOL) 500 MG tablet Take 2 tablets (1,000 mg total) by mouth every 6 (six) hours as needed for mild pain, moderate pain or headache. 60 tablet 0  . ascorbic acid (VITAMIN C) 1000 MG tablet Take 1 tablet (1,000 mg total) by mouth 2 (two) times daily. 30 tablet 2  . ferrous sulfate 325 (65 FE) MG tablet Take 1 tablet (325 mg total) by mouth 2 (two) times daily with a meal. 60 tablet 3  . ibuprofen (ADVIL) 800 MG tablet Take 1 tablet (800 mg total) by mouth every 8 (eight) hours. 30 tablet 0  . pantoprazole (PROTONIX) 40 MG tablet Take 1 tablet (40 mg total)  by mouth daily. 30 tablet 1  . Prenatal Vit-Fe Fumarate-FA (MULTIVITAMIN-PRENATAL) 27-0.8 MG TABS tablet Take 1 tablet by mouth daily at 12 noon.    . Vitamin D, Ergocalciferol, (DRISDOL) 1.25 MG (50000 UNIT) CAPS capsule Take 1 capsule (50,000 Units total) by mouth every 7 (seven) days. 4 capsule 2  . zinc sulfate 220 (50 Zn) MG capsule Take 1 capsule (220 mg total) by mouth daily. 30 capsule 0    Social History   Socioeconomic History  . Marital status: Single    Spouse name: Not on file  . Number of children: 2  . Years of education: Not on file  . Highest  education level: Some college, no degree  Occupational History  . Occupation: out due to knee  Tobacco Use  . Smoking status: Never Smoker  . Smokeless tobacco: Never Used  Vaping Use  . Vaping Use: Never used  Substance and Sexual Activity  . Alcohol use: No    Alcohol/week: 0.0 standard drinks  . Drug use: No  . Sexual activity: Not Currently    Birth control/protection: None  Other Topics Concern  . Not on file  Social History Narrative  . Not on file   Social Determinants of Health   Financial Resource Strain: Low Risk   . Difficulty of Paying Living Expenses: Not hard at all  Food Insecurity: No Food Insecurity  . Worried About Charity fundraiser in the Last Year: Never true  . Ran Out of Food in the Last Year: Never true  Transportation Needs: No Transportation Needs  . Lack of Transportation (Medical): No  . Lack of Transportation (Non-Medical): No  Physical Activity: Insufficiently Active  . Days of Exercise per Week: 3 days  . Minutes of Exercise per Session: 30 min  Stress: No Stress Concern Present  . Feeling of Stress : Only a little  Social Connections: Moderately Integrated  . Frequency of Communication with Friends and Family: More than three times a week  . Frequency of Social Gatherings with Friends and Family: Twice a week  . Attends Religious Services: 1 to 4 times per year  . Active Member of Clubs or Organizations: No  . Attends Archivist Meetings: Never  . Marital Status: Living with partner  Intimate Partner Violence: Not At Risk  . Fear of Current or Ex-Partner: No  . Emotionally Abused: No  . Physically Abused: No  . Sexually Abused: No   Family History  Problem Relation Age of Onset  . Multiple sclerosis Mother   . Heart disease Maternal Grandfather   . Heart disease Paternal Uncle   . Diabetes Paternal Uncle   . Hypertension Paternal Uncle   . Cancer Other        father's aunt had breast cancer  . Hypertension Father   .  Colon cancer Other        maternal great uncle, age greater than 64  . Crohn's disease Other        couple of family members on father's side of family  . Hypertension Paternal Aunt      OBJECTIVE:  Vitals:   10/14/20 1102  BP: 122/78  Pulse: 83  Resp: 16  Temp: 98.5 F (36.9 C)  TempSrc: Oral  SpO2: 99%     General appearance: alert; well-appearing, nontoxic; speaking in full sentences and tolerating own secretions HEENT: NCAT; Ears: EACs clear, TMs pearly gray; Eyes: PERRL.  EOM grossly intact. Nose: nares patent without rhinorrhea, Throat: oropharynx  clear, tonsils non erythematous or enlarged, uvula midline  Neck: supple without LAD Lungs: unlabored respirations, symmetrical air entry; cough: mild; no respiratory distress; CTAB Heart: regular rate and rhythm.  Skin: warm and dry Psychological: alert and cooperative; normal mood and affect    ASSESSMENT & PLAN:  1. Chronic cough     Meds ordered this encounter  Medications  . benzonatate (TESSALON) 100 MG capsule    Sig: Take 1 capsule (100 mg total) by mouth every 8 (eight) hours.    Dispense:  21 capsule    Refill:  0    Order Specific Question:   Supervising Provider    Answer:   Raylene Everts [[id number]]  . albuterol (VENTOLIN HFA) 108 (90 Base) MCG/ACT inhaler    Sig: Inhale 1-2 puffs into the lungs every 6 (six) hours as needed for wheezing or shortness of breath.    Dispense:  18 g    Refill:  0    Order Specific Question:   Supervising Provider    Answer:   Raylene Everts [[id number]]  . cetirizine (ZYRTEC) 10 MG tablet    Sig: Take 1 tablet (10 mg total) by mouth daily.    Dispense:  30 tablet    Refill:  0    Order Specific Question:   Supervising Provider    Answer:   Raylene Everts [[id number]]   Symptoms may be related to allergies, acid reflux, or chronic covid symptom Get plenty of rest and push fluids Zyrtec prescribed.  Use daily for symptomatic relief Prescribed tessolone perles as  needed for cough Albuterol inhaler given in office.   Use OTC medication as needed for symptomatic relief Follow up with PCP or COVID clinic if symptoms persists Return or go to ER if you have any new or worsening symptoms such as fever, chills, fatigue, shortness of breath, wheezing, chest pain, nausea, changes in bowel or bladder habits, etc...  Reviewed expectations re: course of current medical issues. Questions answered. Outlined signs and symptoms indicating need for more acute intervention. Patient verbalized understanding. After Visit Summary given.          Lestine Box, PA-C 10/14/20 1156

## 2020-10-14 NOTE — ED Triage Notes (Signed)
Pt present non productive cough. Pt states tested positive for covid 19 on 07/21/20 and since then she had this dry cough. Pt states she cough up some mucous at times.

## 2020-10-14 NOTE — Discharge Instructions (Signed)
Symptoms may be related to allergies, acid reflux, or chronic covid symptom Get plenty of rest and push fluids Zyrtec prescribed.  Use daily for symptomatic relief Prescribed tessolone perles as needed for cough Albuterol inhaler given in office.   Use OTC medication as needed for symptomatic relief Follow up with PCP or COVID clinic if symptoms persists Return or go to ER if you have any new or worsening symptoms such as fever, chills, fatigue, shortness of breath, wheezing, chest pain, nausea, changes in bowel or bladder habits, etc..Marland Kitchen

## 2020-10-28 ENCOUNTER — Ambulatory Visit: Payer: Self-pay | Admitting: *Deleted

## 2021-01-01 ENCOUNTER — Ambulatory Visit: Payer: PRIVATE HEALTH INSURANCE | Admitting: Adult Health

## 2021-01-22 ENCOUNTER — Other Ambulatory Visit: Payer: Self-pay

## 2021-01-22 ENCOUNTER — Encounter: Payer: Self-pay | Admitting: Adult Health

## 2021-01-22 ENCOUNTER — Ambulatory Visit (INDEPENDENT_AMBULATORY_CARE_PROVIDER_SITE_OTHER): Payer: Medicaid Other | Admitting: Adult Health

## 2021-01-22 VITALS — BP 125/81 | HR 97 | Ht 62.5 in | Wt 224.0 lb

## 2021-01-22 DIAGNOSIS — Z3202 Encounter for pregnancy test, result negative: Secondary | ICD-10-CM | POA: Diagnosis not present

## 2021-01-22 DIAGNOSIS — Z975 Presence of (intrauterine) contraceptive device: Secondary | ICD-10-CM | POA: Insufficient documentation

## 2021-01-22 DIAGNOSIS — R635 Abnormal weight gain: Secondary | ICD-10-CM | POA: Diagnosis not present

## 2021-01-22 DIAGNOSIS — M25471 Effusion, right ankle: Secondary | ICD-10-CM | POA: Insufficient documentation

## 2021-01-22 DIAGNOSIS — R519 Headache, unspecified: Secondary | ICD-10-CM | POA: Diagnosis not present

## 2021-01-22 DIAGNOSIS — R0602 Shortness of breath: Secondary | ICD-10-CM | POA: Diagnosis not present

## 2021-01-22 DIAGNOSIS — M25472 Effusion, left ankle: Secondary | ICD-10-CM | POA: Diagnosis not present

## 2021-01-22 LAB — POCT URINE PREGNANCY: Preg Test, Ur: NEGATIVE

## 2021-01-22 MED ORDER — SUMATRIPTAN SUCCINATE 25 MG PO TABS: 25.0000 mg | ORAL_TABLET | ORAL | 0 refills | Status: DC | PRN

## 2021-01-22 MED ORDER — ONDANSETRON HCL 4 MG PO TABS: 4.0000 mg | ORAL_TABLET | Freq: Three times a day (TID) | ORAL | 1 refills | Status: DC | PRN

## 2021-01-22 NOTE — Progress Notes (Signed)
  Subjective:     Patient ID: Melissa Livingston, female   DOB: [birth year], 25 y.o.   MRN: [id number]  HPI Melissa Livingston is a 25 year old black female,single, G3P3 in complaining of frequent headaches, about 2 a week, and some nausea, and weight gain, and swelling in ankles if stands a lot at work. She has noticed this since nexplanon placed after delivery, but she really like the nexplanon. PCP is Dr Sallee Lange.  Review of Systems +frequent headaches +weight gain,has gained about 25 lbs since October  +swelling in ankles +shortness of breath with exertion,when climbs stairs to apartment on third floof  Reviewed past medical,surgical, social and family history. Reviewed medications and allergies.     Objective:   Physical Exam BP 125/81 (BP Location: Left Arm, Patient Position: Sitting, Cuff Size: Large)   Pulse 97   Ht 5' 2.5" (1.588 m)   Wt 224 lb (101.6 kg)   Breastfeeding No   BMI 40.32 kg/m  UPT is negative. Skin warm and dry. Neck: mid line trachea, normal thyroid, good ROM, no lymphadenopathy noted. Lungs: clear to ausculation bilaterally. Cardiovascular: regular rate and rhythm. No pitting edema noted on BLE.   Fall risk is low  Upstream - 01/22/21 1424      Pregnancy Intention Screening   Does the patient want to become pregnant in the next year? No    Does the patient's partner want to become pregnant in the next year? No    Would the patient like to discuss contraceptive options today? Yes      Contraception Wrap Up   Current Method Hormonal Implant    End Method Hormonal Implant    Contraception Counseling Provided Yes             Assessment:     1. Pregnancy examination or test, negative result - POCT urine pregnancy  2. Frequent headaches - CBC - Comprehensive metabolic panel Will try imitrex and zofran Meds ordered this encounter  Medications  . SUMAtriptan (IMITREX) 25 MG tablet    Sig: Take 1 tablet (25 mg total) by mouth every 2 (two) hours as needed for  migraine. May repeat in 2 hours if headache persists or recurs.    Dispense:  10 tablet    Refill:  0    Order Specific Question:   Supervising Provider    Answer:   Elonda Husky, LUTHER H [[id number]]  . ondansetron (ZOFRAN) 4 MG tablet    Sig: Take 1 tablet (4 mg total) by mouth every 8 (eight) hours as needed for nausea or vomiting.    Dispense:  20 tablet    Refill:  1    Order Specific Question:   Supervising Provider    Answer:   Elonda Husky, LUTHER H [[id number]]    3. Weight gain - Comprehensive metabolic panel - TSH  4. Nexplanon in place Did discuss other birth control options, will keep nexplanon for now   5. Swelling of both ankles - CBC - Comprehensive metabolic panel - TSH  6. Short of breath on exertion - CBC - Comprehensive metabolic panel    Plan:    Will check labs to rule out anemia, electrolyte and thyroid problems  Follow up in 2 weeks for ROS May need cardiology referral

## 2021-01-23 LAB — COMPREHENSIVE METABOLIC PANEL
ALT: 16 IU/L (ref 0–32)
AST: 14 IU/L (ref 0–40)
Albumin/Globulin Ratio: 1.7 (ref 1.2–2.2)
Albumin: 4.4 g/dL (ref 3.9–5.0)
Alkaline Phosphatase: 101 IU/L (ref 44–121)
BUN/Creatinine Ratio: 9 (ref 9–23)
BUN: 8 mg/dL (ref 6–20)
Bilirubin Total: 0.5 mg/dL (ref 0.0–1.2)
CO2: 21 mmol/L (ref 20–29)
Calcium: 9.2 mg/dL (ref 8.7–10.2)
Chloride: 103 mmol/L (ref 96–106)
Creatinine, Ser: 0.92 mg/dL (ref 0.57–1.00)
GFR calc Af Amer: 99 mL/min/{1.73_m2} (ref >59)
GFR calc non Af Amer: 86 mL/min/{1.73_m2} (ref >59)
Globulin, Total: 2.6 g/dL (ref 1.5–4.5)
Glucose: 72 mg/dL (ref 65–99)
Potassium: 4.3 mmol/L (ref 3.5–5.2)
Sodium: 139 mmol/L (ref 134–144)
Total Protein: 7 g/dL (ref 6.0–8.5)

## 2021-01-23 LAB — CBC
Hematocrit: 39 % (ref 34.0–46.6)
Hemoglobin: 12.3 g/dL (ref 11.1–15.9)
MCH: 23.3 pg — ABNORMAL LOW (ref 26.6–33.0)
MCHC: 31.5 g/dL (ref 31.5–35.7)
MCV: 74 fL — ABNORMAL LOW (ref 79–97)
Platelets: 370 10*3/uL (ref 150–450)
RBC: 5.27 x10E6/uL (ref 3.77–5.28)
RDW: 16.4 % — ABNORMAL HIGH (ref 11.7–15.4)
WBC: 9.2 10*3/uL (ref 3.4–10.8)

## 2021-01-23 LAB — TSH: TSH: 1.05 u[IU]/mL (ref 0.450–4.500)

## 2021-02-05 ENCOUNTER — Other Ambulatory Visit: Payer: Self-pay

## 2021-02-05 ENCOUNTER — Ambulatory Visit (INDEPENDENT_AMBULATORY_CARE_PROVIDER_SITE_OTHER): Payer: 59 | Admitting: Adult Health

## 2021-02-05 ENCOUNTER — Encounter: Payer: Self-pay | Admitting: Adult Health

## 2021-02-05 VITALS — BP 134/87 | HR 105 | Ht 62.0 in | Wt 227.0 lb

## 2021-02-05 DIAGNOSIS — F32A Depression, unspecified: Secondary | ICD-10-CM | POA: Diagnosis not present

## 2021-02-05 DIAGNOSIS — M25472 Effusion, left ankle: Secondary | ICD-10-CM

## 2021-02-05 DIAGNOSIS — M25471 Effusion, right ankle: Secondary | ICD-10-CM

## 2021-02-05 DIAGNOSIS — R0602 Shortness of breath: Secondary | ICD-10-CM

## 2021-02-05 DIAGNOSIS — R519 Headache, unspecified: Secondary | ICD-10-CM

## 2021-02-05 DIAGNOSIS — Z975 Presence of (intrauterine) contraceptive device: Secondary | ICD-10-CM

## 2021-02-05 MED ORDER — TIZANIDINE HCL 2 MG PO TABS: 2.0000 mg | ORAL_TABLET | Freq: Four times a day (QID) | ORAL | 0 refills | Status: DC | PRN

## 2021-02-05 MED ORDER — ESCITALOPRAM OXALATE 10 MG PO TABS: 10.0000 mg | ORAL_TABLET | Freq: Every day | ORAL | 2 refills | Status: DC

## 2021-02-05 NOTE — Progress Notes (Signed)
  Subjective:     Patient ID: Melissa Livingston, female   DOB: 1996-11-30, 25 y.o.   MRN: [id number]  HPI Melissa Livingston is a 25 year old black female, single, G3P3 back in follow up on headaches and still having, and still short of breath has has swelling. Labs done 01/22/21, normal.  PCP is Sallee Lange MD.  Review of Systems Still having headaches about 2 a week but meds help +depression,she yelled at kids and cried for 2 hours  Shortness of breath with climbing stairs Swelling in ankles Reviewed past medical,surgical, social and family history. Reviewed medications and allergies.     Objective:   Physical Exam BP 134/87 (BP Location: Left Arm, Patient Position: Sitting, Cuff Size: Large)   Pulse (!) 105   Ht 5\' 2"  (1.575 m)   Wt 227 lb (103 kg)   LMP 02/05/2021   Breastfeeding No   BMI 41.52 kg/m  Skin warm and dry. Lungs: clear to ausculation bilaterally. Cardiovascular: regular rate and rhythm. No swelling today in BLE. PHQ 9 score is 14, no SI, open to meds Fall risk is low    Upstream - 02/05/21 1605      Pregnancy Intention Screening   Does the patient want to become pregnant in the next year? No    Does the patient's partner want to become pregnant in the next year? No    Would the patient like to discuss contraceptive options today? No      Contraception Wrap Up   Current Method Hormonal Implant    End Method Hormonal Implant    Contraception Counseling Provided No             Assessment:     1. Frequent headaches Has Imitrex Try ice on neck  Will add zanaflex prn Meds ordered this encounter  Medications  . tiZANidine (ZANAFLEX) 2 MG tablet    Sig: Take 1 tablet (2 mg total) by mouth every 6 (six) hours as needed for muscle spasms.    Dispense:  30 tablet    Refill:  0    Order Specific Question:   Supervising Provider    Answer:   Elonda Husky, LUTHER H [[id number]]  . escitalopram (LEXAPRO) 10 MG tablet    Sig: Take 1 tablet (10 mg total) by mouth daily.    Dispense:  30  tablet    Refill:  2    Order Specific Question:   Supervising Provider    Answer:   Elonda Husky, LUTHER H [[id number]]    2. Swelling of both ankles Will refer to cardiology   3. Short of breath on exertion Will refer to cardiology  4. Nexplanon in place Will leave in place for now  5. Depression, unspecified depression type Will rx lexapro See me in 8 weeks for follow up     Plan:     Follow up in 8 weeks or sooner if needed

## 2021-02-28 ENCOUNTER — Encounter: Payer: Self-pay | Admitting: Internal Medicine

## 2021-02-28 ENCOUNTER — Ambulatory Visit: Payer: Medicaid Other | Admitting: Internal Medicine

## 2021-02-28 ENCOUNTER — Other Ambulatory Visit: Payer: Self-pay

## 2021-02-28 VITALS — BP 130/82 | HR 83 | Resp 99 | Ht 62.0 in | Wt 226.4 lb

## 2021-02-28 DIAGNOSIS — R0602 Shortness of breath: Secondary | ICD-10-CM | POA: Diagnosis not present

## 2021-02-28 NOTE — Progress Notes (Signed)
Cardiology Office Note   Date:  02/28/2021   ID:  Melissa Livingston, Melissa Livingston [birth year], MRN [id number]  PCP:  Kathyrn Drown, MD  Cardiologist:   Dorris Carnes, MD   Pt referred by Lance Sell for evaluation of SOB      History of Present Illness: Melissa Livingston is a 25 y.o. female with no prior cardiac hx   She has had 3 pregnancies, each complicated by HTN and edema   The first 2 prg came off of meds and edema improved.  THie last pregnancy she developed COVID during last month of pregnancy (AUg 2021)  Delivered in September  The pt says that since delivery she has had SOB with activity and edema   Works on her feet   By end of day feet will be very swollen    Notes SOB with stairs or with waling to park with family     has had had some chest discomfort that she attrib to GERD      Current Meds  Medication Sig  . acetaminophen (TYLENOL) 500 MG tablet Take 2 tablets (1,000 mg total) by mouth every 6 (six) hours as needed for mild pain, moderate pain or headache.  . albuterol (VENTOLIN HFA) 108 (90 Base) MCG/ACT inhaler Inhale 1-2 puffs into the lungs every 6 (six) hours as needed for wheezing or shortness of breath.  . cetirizine (ZYRTEC) 10 MG tablet Take 1 tablet (10 mg total) by mouth daily. (Patient taking differently: Take 10 mg by mouth as needed.)  . escitalopram (LEXAPRO) 10 MG tablet Take 1 tablet (10 mg total) by mouth daily.  Marland Kitchen etonogestrel (NEXPLANON) 68 MG IMPL implant 1 each by Subdermal route once.  . ferrous sulfate 325 (65 FE) MG tablet Take 1 tablet (325 mg total) by mouth 2 (two) times daily with a meal.  . ibuprofen (ADVIL) 800 MG tablet Take 1 tablet (800 mg total) by mouth every 8 (eight) hours. (Patient taking differently: Take 800 mg by mouth as needed.)  . ondansetron (ZOFRAN) 4 MG tablet Take 1 tablet (4 mg total) by mouth every 8 (eight) hours as needed for nausea or vomiting.  . pantoprazole (PROTONIX) 40 MG tablet Take 1 tablet (40 mg total) by mouth daily.  . SUMAtriptan  (IMITREX) 25 MG tablet Take 1 tablet (25 mg total) by mouth every 2 (two) hours as needed for migraine. May repeat in 2 hours if headache persists or recurs.  Marland Kitchen tiZANidine (ZANAFLEX) 2 MG tablet Take 1 tablet (2 mg total) by mouth every 6 (six) hours as needed for muscle spasms.  . Vitamin D, Ergocalciferol, (DRISDOL) 1.25 MG (50000 UNIT) CAPS capsule Take 1 capsule (50,000 Units total) by mouth every 7 (seven) days.     Allergies:   Adhesive [tape], Lorabid [loracarbef], Latex, and Orange fruit [citrus]   Past Medical History:  Diagnosis Date  . Anemia   . Anxiety   . Asthma    as child  . Endometritis 01/11/2015  . GERD (gastroesophageal reflux disease)   . Headache   . Hematochezia 05/08/2015  . History of ovarian cyst 01/28/2015  . IBS (irritable bowel syndrome)   . Infection    UTI  . Nausea and vomiting 01/11/2015  . Ovarian cyst 01/14/2015  . Pelvic pain in female 01/11/2015  . Pregnancy induced hypertension    with pregnancy  . Pregnant 02/05/2016  . Rectal bleeding 07/18/2013    Past Surgical History:  Procedure Laterality Date  . CESAREAN  SECTION N/A 09/29/2014   Procedure: CESAREAN SECTION;  Surgeon: Jonnie Kind, MD;  Location: [facility];  Service: Obstetrics;  Laterality: N/A;  . CESAREAN SECTION N/A 09/25/2016   Procedure: CESAREAN SECTION;  Surgeon: Jonnie Kind, MD;  Location: [facility];  Service: Obstetrics;  Laterality: N/A;  . CESAREAN SECTION N/A 08/25/2020   Procedure: CESAREAN SECTION;  Surgeon: Florian Buff, MD;  Location: [facility];  Service: Obstetrics;  Laterality: N/A;  . CHOLECYSTECTOMY N/A 03/07/2018   Procedure: LAPAROSCOPIC CHOLECYSTECTOMY;  Surgeon: Aviva Signs, MD;  Location: [facility];  Service: General;  Laterality: N/A;  . COLONOSCOPY WITH PROPOFOL N/A 08/02/2017   Dr. Gala Romney: moderate internal hemorhoids, distal 5 cm of TI also appeared normal  . ESOPHAGOGASTRODUODENOSCOPY (EGD) WITH ESOPHAGEAL DILATION N/A 07/19/2013   Dr. Rourk:normal  appearing esophagus s/p dilation, normal D1 and D2, unremarkable path   . ESOPHAGOGASTRODUODENOSCOPY (EGD) WITH PROPOFOL N/A 08/02/2017   Normal esophagus, small hiatal hernia, normal duodenum  . none    . WISDOM TOOTH EXTRACTION       Social History:  The patient  reports that she has never smoked. She has never used smokeless tobacco. She reports that she does not drink alcohol and does not use drugs.   Family History:  The patient's family history includes Cancer in an other family member; Colon cancer in an other family member; Crohn's disease in an other family member; Diabetes in her paternal uncle; Heart disease in her maternal grandfather and paternal uncle; Hypertension in her father, paternal aunt, and paternal uncle; Multiple sclerosis in her mother.    ROS:  Please see the history of present illness. All other systems are reviewed and  Negative to the above problem except as noted.    PHYSICAL EXAM: VS:  BP 130/82 (BP Location: Right Arm, Patient Position: Sitting, Cuff Size: Normal)   Pulse 83   Resp (!) 99   Ht 5\' 2"  (1.575 m)   Wt 226 lb 6.4 oz (102.7 kg)   LMP 02/05/2021   BMI 41.41 kg/m   GEN: Morbidly obese 25 yo  in no acute distress  HEENT: normal  Neck: no JVD, carotid bruits Cardiac: RRR; no murmurs  Triv LE  edema  Respiratory:  clear to auscultation bilaterally,  GI: soft, nontender, nondistended, + BS  No hepatomegaly  MS: no deformity Moving all extremities   Skin: warm and dry, no rash Neuro:  Strength and sensation are intact Psych: euthymic mood, full affect   EKG:  EKG is ordered today.  NSR 87 bpm    Lipid Panel No results found for: CHOL, TRIG, HDL, CHOLHDL, VLDL, LDLCALC, LDLDIRECT    Wt Readings from Last 3 Encounters:  02/28/21 226 lb 6.4 oz (102.7 kg)  02/05/21 227 lb (103 kg)  01/22/21 224 lb (101.6 kg)      ASSESSMENT AND PLAN:  1  Dyspnea   Pt has never recovered since delivery  Exam:  Volume is pretty good    Will get echo  to eval systolic / diastlic function  Watch NA intake  2  Blood pressure  BP is OK today  Follow  Will need to follow as time goes on given hx of HTN in pregnancy  3  Morbid obesity  Discussed diet  Watch carbs   TRE.    F/U based on test results.    Current medicines are reviewed at length with the patient today.  The patient does not have concerns regarding medicines.  Signed, Dorris Carnes, MD  02/28/2021 9:35 AM    [address], [place], [place]  [address] Phone: [phone]; Fax: [phone]

## 2021-02-28 NOTE — Patient Instructions (Signed)
Medication Instructions:  Your physician recommends that you continue on your current medications as directed. Please refer to the Current Medication list given to you today.  *If you need a refill on your cardiac medications before your next appointment, please call your pharmacy*   Lab Work: NONE   If you have labs (blood work) drawn today and your tests are completely normal, you will receive your results only by: Marland Kitchen MyChart Message (if you have MyChart) OR . A paper copy in the mail If you have any lab test that is abnormal or we need to change your treatment, we will call you to review the results.   Testing/Procedures: Your physician has requested that you have an echocardiogram. Echocardiography is a painless test that uses sound waves to create images of your heart. It provides your doctor with information about the size and shape of your heart and how well your heart's chambers and valves are working. This procedure takes approximately one hour. There are no restrictions for this procedure.   Follow-Up: At [facility], you and your health needs are our priority.  As part of our continuing mission to provide you with exceptional heart care, we have created designated Provider Care Teams.  These Care Teams include your primary Cardiologist (physician) and Advanced Practice Providers (APPs -  Physician Assistants and Nurse Practitioners) who all work together to provide you with the care you need, when you need it.  We recommend signing up for the patient portal called "MyChart".  Sign up information is provided on this After Visit Summary.  MyChart is used to connect with patients for Virtual Visits (Telemedicine).  Patients are able to view lab/test results, encounter notes, upcoming appointments, etc.  Non-urgent messages can be sent to your provider as well.   To learn more about what you can do with MyChart, go to [email or web address].    Your next appointment:    Pending  test results   The format for your next appointment:   In Person  Provider:   Dorris Carnes, MD   Other Instructions Thank you for choosing [facility]!

## 2021-03-14 ENCOUNTER — Other Ambulatory Visit: Payer: Self-pay

## 2021-03-14 ENCOUNTER — Encounter: Payer: Self-pay | Admitting: Nurse Practitioner

## 2021-03-14 ENCOUNTER — Ambulatory Visit (INDEPENDENT_AMBULATORY_CARE_PROVIDER_SITE_OTHER): Payer: 59 | Admitting: Nurse Practitioner

## 2021-03-14 VITALS — BP 124/76 | Temp 97.9°F | Wt 225.6 lb

## 2021-03-14 DIAGNOSIS — M25512 Pain in left shoulder: Secondary | ICD-10-CM

## 2021-03-14 DIAGNOSIS — M62838 Other muscle spasm: Secondary | ICD-10-CM | POA: Diagnosis not present

## 2021-03-14 NOTE — Progress Notes (Signed)
   Subjective:    Patient ID: Melissa Livingston, female    DOB: 04-May-1996, 25 y.o.   MRN: [id number]  HPI Pt having pain in right shoulder. Began in neck area and moved to shoulder. Now pain is in shoulder and elbow area. Pt having some swelling and stiffness. Has been rotating between Ibuprofen and Tylenol.  Began last Monday. Started on the right side of the neck. Now radiating into the right shoulder and upper back area. History of whiplash injury from [place] in 2020. 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 No other injuries or repetitive motions.  Occasional mild tingling in the right hand. No significant weakness. She is left handed. Has Nexplanon for birth control. She is not breastfeeding.  Has tried Ibuprofen, Tylenol and a dose of Tizanidine with minimal improvement.   Review of Systems     Objective:   Physical Exam NAD. Alert, oriented. Lungs clear. Heart RRR. Extremely tight tender muscles noted along the right neck area into the trapezium and rhomboids. Can perform full active ROM of the neck with some tenderness. Limited active ROM of the right shoulder due to pain. Cannot rotate above shoulder height. Hand and arm strength 5+ bilaterally. Sensation grossly intact. Radial pulses strong and equal. She has her infant daughter with her today on her lap but cannot lift or hold her with her right arm.  Today's Vitals   03/14/21 1111  BP: 124/76  Temp: 97.9 F (36.6 C)  Weight: 225 lb 9.6 oz (102.3 kg)   Body mass index is 41.26 kg/m.       Assessment & Plan:  Acute pain of left shoulder  Muscle spasms of neck  Meds ordered this encounter  Medications  . meloxicam (MOBIC) 15 MG tablet    Sig: Take 1 tablet (15 mg total) by mouth daily. Prn pain    Dispense:  30 tablet    Refill:  0    Order Specific Question:   Supervising Provider    Answer:   Sallee Lange A [[id number]]   Given a copy of Jobe's shoulder exercises.  Hold on  Ibuprofen. Start Meloxicam as directed. Take with food. DC if any stomach upset.  Also recommend measures such as TENS unit and massage therapy.  Topicals such as Lidocaine patches and Voltaren gel.  Plan is to improve movement and decrease muscle spasms over the next 7-10 days. To call back is persists for further work up. Recommend she report this to her supervisor in case this is a work related injury.  Return if symptoms worsen or fail to improve.

## 2021-03-15 ENCOUNTER — Encounter: Payer: Self-pay | Admitting: Nurse Practitioner

## 2021-03-16 MED ORDER — MELOXICAM 15 MG PO TABS: 15.0000 mg | ORAL_TABLET | Freq: Every day | ORAL | 0 refills | Status: DC

## 2021-03-19 ENCOUNTER — Ambulatory Visit (HOSPITAL_COMMUNITY)
Admission: RE | Admit: 2021-03-19 | Discharge: 2021-03-19 | Disposition: A | Discharge status: Home / Self Care | Payer: 59 | Source: Ambulatory Visit | Attending: Internal Medicine | Admitting: Internal Medicine

## 2021-03-19 ENCOUNTER — Other Ambulatory Visit: Payer: Self-pay

## 2021-03-19 DIAGNOSIS — R0602 Shortness of breath: Secondary | ICD-10-CM | POA: Insufficient documentation

## 2021-03-19 LAB — ECHOCARDIOGRAM COMPLETE
Area-P 1/2: 3.53 cm2
S' Lateral: 2.1 cm

## 2021-03-19 NOTE — Progress Notes (Signed)
*  PRELIMINARY RESULTS* Echocardiogram 2D Echocardiogram has been performed.  [name] 03/19/2021, 2:51 PM

## 2021-03-21 ENCOUNTER — Telehealth: Payer: Self-pay

## 2021-03-21 DIAGNOSIS — R0602 Shortness of breath: Secondary | ICD-10-CM

## 2021-03-21 NOTE — Telephone Encounter (Signed)
Contacted patient who verbalized understanding of result note. She had no questions or concerns at this point. PFT test order.

## 2021-03-21 NOTE — Telephone Encounter (Signed)
-----   Message from Fay Records, MD sent at 03/20/2021 10:24 PM EDT ----- Echo shows normal LV and RV function   Normal valve function. Does not explain SOB Would set up for PFTss

## 2021-04-02 ENCOUNTER — Ambulatory Visit: Payer: Medicaid Other | Admitting: Adult Health

## 2021-04-23 ENCOUNTER — Telehealth: Payer: Self-pay | Admitting: Family Medicine

## 2021-04-23 DIAGNOSIS — Z13220 Encounter for screening for lipoid disorders: Secondary | ICD-10-CM

## 2021-04-23 DIAGNOSIS — D509 Iron deficiency anemia, unspecified: Secondary | ICD-10-CM

## 2021-04-23 DIAGNOSIS — Z0000 Encounter for general adult medical examination without abnormal findings: Secondary | ICD-10-CM

## 2021-04-23 NOTE — Telephone Encounter (Signed)
Last labs 01/22/21 tsh, cmp, cbc

## 2021-04-23 NOTE — Telephone Encounter (Signed)
Patient has physical on 6/30 and needing lab done

## 2021-04-23 NOTE — Telephone Encounter (Signed)
CMP, CBC, ferritin, TIBC, lipid Screening lipid Physical Iron deficient anemia

## 2021-04-24 NOTE — Telephone Encounter (Signed)
Bw orders put in and pt notified.  

## 2021-04-29 ENCOUNTER — Telehealth: Payer: Self-pay

## 2021-04-29 ENCOUNTER — Other Ambulatory Visit: Payer: Self-pay | Admitting: *Deleted

## 2021-04-29 DIAGNOSIS — R635 Abnormal weight gain: Secondary | ICD-10-CM

## 2021-04-29 DIAGNOSIS — D509 Iron deficiency anemia, unspecified: Secondary | ICD-10-CM

## 2021-04-29 DIAGNOSIS — Z0000 Encounter for general adult medical examination without abnormal findings: Secondary | ICD-10-CM

## 2021-04-29 DIAGNOSIS — Z13220 Encounter for screening for lipoid disorders: Secondary | ICD-10-CM

## 2021-04-29 NOTE — Telephone Encounter (Signed)
Orders put in and pt was notified.  

## 2021-04-29 NOTE — Telephone Encounter (Signed)
Orders put in on 5/19. Pt wants to add tsh. Is it ok to add tsh?

## 2021-04-29 NOTE — Telephone Encounter (Signed)
May add TSH and free T4

## 2021-04-29 NOTE — Telephone Encounter (Signed)
Left message to return call. Tsh done in feb that was normal. Does pt still want tsh?

## 2021-04-29 NOTE — Telephone Encounter (Signed)
Pt wants TSH ordered for her blood work coming up visit   Pt call back [phone]

## 2021-04-29 NOTE — Telephone Encounter (Signed)
I spoke with Pt and she was aware of the TSH lab in Feb and she said she is still gaining weight and wanted another ordered.

## 2021-05-07 ENCOUNTER — Encounter: Payer: Self-pay | Admitting: Family Medicine

## 2021-06-02 DIAGNOSIS — R635 Abnormal weight gain: Secondary | ICD-10-CM | POA: Diagnosis not present

## 2021-06-02 DIAGNOSIS — D509 Iron deficiency anemia, unspecified: Secondary | ICD-10-CM | POA: Diagnosis not present

## 2021-06-02 DIAGNOSIS — Z0000 Encounter for general adult medical examination without abnormal findings: Secondary | ICD-10-CM | POA: Diagnosis not present

## 2021-06-02 DIAGNOSIS — Z13220 Encounter for screening for lipoid disorders: Secondary | ICD-10-CM | POA: Diagnosis not present

## 2021-06-03 ENCOUNTER — Other Ambulatory Visit: Payer: Self-pay | Admitting: Family Medicine

## 2021-06-03 ENCOUNTER — Other Ambulatory Visit: Payer: Self-pay

## 2021-06-03 DIAGNOSIS — D509 Iron deficiency anemia, unspecified: Secondary | ICD-10-CM

## 2021-06-03 LAB — CBC WITH DIFFERENTIAL/PLATELET
Basophils Absolute: 0 10*3/uL (ref 0.0–0.2)
Basos: 0 %
EOS (ABSOLUTE): 0.2 10*3/uL (ref 0.0–0.4)
Eos: 2 %
Hematocrit: 35.7 % (ref 34.0–46.6)
Hemoglobin: 11.5 g/dL (ref 11.1–15.9)
Immature Grans (Abs): 0 10*3/uL (ref 0.0–0.1)
Immature Granulocytes: 0 %
Lymphocytes Absolute: 2.4 10*3/uL (ref 0.7–3.1)
Lymphs: 26 %
MCH: 24.3 pg — ABNORMAL LOW (ref 26.6–33.0)
MCHC: 32.2 g/dL (ref 31.5–35.7)
MCV: 75 fL — ABNORMAL LOW (ref 79–97)
Monocytes Absolute: 0.6 10*3/uL (ref 0.1–0.9)
Monocytes: 7 %
Neutrophils Absolute: 5.9 10*3/uL (ref 1.4–7.0)
Neutrophils: 65 %
Platelets: 305 10*3/uL (ref 150–450)
RBC: 4.74 x10E6/uL (ref 3.77–5.28)
RDW: 14.6 % (ref 11.7–15.4)
WBC: 9.1 10*3/uL (ref 3.4–10.8)

## 2021-06-03 LAB — LIPID PANEL
Chol/HDL Ratio: 3.7 ratio (ref 0.0–4.4)
Cholesterol, Total: 143 mg/dL (ref 100–199)
HDL: 39 mg/dL — ABNORMAL LOW (ref >39)
LDL Chol Calc (NIH): 83 mg/dL (ref 0–99)
Triglycerides: 112 mg/dL (ref 0–149)
VLDL Cholesterol Cal: 21 mg/dL (ref 5–40)

## 2021-06-03 LAB — COMPREHENSIVE METABOLIC PANEL
ALT: 22 IU/L (ref 0–32)
AST: 17 IU/L (ref 0–40)
Albumin/Globulin Ratio: 2 (ref 1.2–2.2)
Albumin: 4.4 g/dL (ref 3.9–5.0)
Alkaline Phosphatase: 113 IU/L (ref 44–121)
BUN/Creatinine Ratio: 9 (ref 9–23)
BUN: 7 mg/dL (ref 6–20)
Bilirubin Total: 0.4 mg/dL (ref 0.0–1.2)
CO2: 22 mmol/L (ref 20–29)
Calcium: 8.9 mg/dL (ref 8.7–10.2)
Chloride: 103 mmol/L (ref 96–106)
Creatinine, Ser: 0.77 mg/dL (ref 0.57–1.00)
Globulin, Total: 2.2 g/dL (ref 1.5–4.5)
Glucose: 83 mg/dL (ref 65–99)
Potassium: 4.2 mmol/L (ref 3.5–5.2)
Sodium: 138 mmol/L (ref 134–144)
Total Protein: 6.6 g/dL (ref 6.0–8.5)
eGFR: 108 mL/min/{1.73_m2} (ref >59)

## 2021-06-03 LAB — TSH: TSH: 1.42 u[IU]/mL (ref 0.450–4.500)

## 2021-06-03 LAB — T4, FREE: Free T4: 1.11 ng/dL (ref 0.82–1.77)

## 2021-06-03 LAB — IRON AND TIBC
Iron Saturation: 13 % — ABNORMAL LOW (ref 15–55)
Iron: 40 ug/dL (ref 27–159)
Total Iron Binding Capacity: 317 ug/dL (ref 250–450)
UIBC: 277 ug/dL (ref 131–425)

## 2021-06-03 LAB — FERRITIN: Ferritin: 22 ng/mL (ref 15–150)

## 2021-06-03 MED ORDER — FERROUS SULFATE 325 (65 FE) MG PO TABS: ORAL_TABLET | ORAL | 3 refills | Status: DC

## 2021-06-05 ENCOUNTER — Ambulatory Visit (INDEPENDENT_AMBULATORY_CARE_PROVIDER_SITE_OTHER): Payer: 59 | Admitting: Nurse Practitioner

## 2021-06-05 ENCOUNTER — Other Ambulatory Visit: Payer: Self-pay

## 2021-06-05 DIAGNOSIS — G479 Sleep disorder, unspecified: Secondary | ICD-10-CM

## 2021-06-05 DIAGNOSIS — M545 Low back pain, unspecified: Secondary | ICD-10-CM

## 2021-06-05 DIAGNOSIS — F418 Other specified anxiety disorders: Secondary | ICD-10-CM

## 2021-06-05 LAB — POCT URINALYSIS DIP (MANUAL ENTRY)
Bilirubin, UA: NEGATIVE
Glucose, UA: NEGATIVE mg/dL
Ketones, POC UA: NEGATIVE mg/dL
Nitrite, UA: NEGATIVE
Spec Grav, UA: 1.015 (ref 1.010–1.025)
Urobilinogen, UA: 0.2 E.U./dL
pH, UA: 6.5 (ref 5.0–8.0)

## 2021-06-05 MED ORDER — TRAZODONE HCL 50 MG PO TABS: 25.0000 mg | ORAL_TABLET | Freq: Every evening | ORAL | 0 refills | Status: DC | PRN

## 2021-06-05 NOTE — Progress Notes (Signed)
Subjective:    Patient ID: Melissa Livingston, female    DOB: 1996-01-21, 25 y.o.   MRN: [id number]  HPI The patient comes in today for a wellness visit.    A review of their health history was completed.  A review of medications was also completed.  Any needed refills; non  Eating habits: good  Falls/  MVA accidents in past few months: none  Regular exercise: walks 2 x week  Specialist pt sees on regular basis: none  Preventative health issues were discussed.   Additional concerns: mild acute bilateral low back pain; worse with movement at times. Very active job requiring lifting   Gets her regular preventive health physicals with gynecology.  Has Nexplanon for birth control.  Has a cycle about every other month.  Healthy diet.  Has a very active job requiring a lot of walking.  Also tries to stay active on the weekends.  Patient mainly here today due to extreme stress related to her current work environment.  Works as a Engineer, building services.  With low staffing patient has had 2 increase her workload which has affected her sleep and increased her anxiety.  Currently on Lexapro 10 mg prescribed by her gynecology provider.  Denies any suicidal or homicidal thoughts or ideation.  Significant difficulty sleeping, trouble going to sleep and staying asleep. Mild bilateral low back pain.  No CVA area tenderness.  No dysuria urgency or frequency.  No new sexual partners.  No vaginal discharge or pelvic pain.  No fevers.  No history of recent UTI. Depression screen PHQ 2/9 06/05/2021  Decreased Interest 1  Down, Depressed, Hopeless 2  PHQ - 2 Score 3  Altered sleeping 3  Tired, decreased energy 3  Change in appetite 3  Feeling bad or failure about yourself  1  Trouble concentrating 3  Moving slowly or fidgety/restless 3  Suicidal thoughts 0  PHQ-9 Score 19  Difficult doing work/chores Somewhat difficult  Some recent data might be hidden   GAD 7 : Generalized Anxiety Score 06/05/2021 05/28/2020   Nervous, Anxious, on Edge 2 0  Control/stop worrying 3 0  Worry too much - different things 2 0  Trouble relaxing 3 0  Restless 3 0  Easily annoyed or irritable 2 0  Afraid - awful might happen 0 0  Total GAD 7 Score 15 0  Anxiety Difficulty Somewhat difficult Not difficult at all       Objective:   Physical Exam NAD.  Alert, oriented.  Mildly anxious affect.  Making good eye contact.  Dressed appropriately.  Slightly tearful at times.  Thoughts logical coherent and relevant.  Lungs clear.  Heart regular rate rhythm.  No CVA tenderness.  Mild tenderness in the bilateral sacroiliac area.  Gets on and off exam table without difficulty. Today's Vitals   06/05/21 1420  BP: 121/82  Pulse: 99  Temp: 97.9 F (36.6 C)  SpO2: 97%  Weight: 230 lb (104.3 kg)   Body mass index is 42.07 kg/m. See recent labs 06/02/21. Results for orders placed or performed in visit on 06/05/21  POCT urinalysis dipstick  Result Value Ref Range   Color, UA yellow yellow   Clarity, UA cloudy (A) clear   Glucose, UA negative negative mg/dL   Bilirubin, UA negative negative   Ketones, POC UA negative negative mg/dL   Spec Grav, UA 1.015 1.010 - 1.025   Blood, UA small (A) negative   pH, UA 6.5 5.0 - 8.0   Protein Ur,  POC trace (A) negative mg/dL   Urobilinogen, UA 0.2 0.2 or 1.0 E.U./dL   Nitrite, UA Negative Negative   Leukocytes, UA Moderate (2+) (A) Negative  POCT UA - Microscopic Only  Result Value Ref Range   WBC, Ur, HPF, POC >10 0 - 5   RBC, Urine, Miroscopic rare 0 - 2   Bacteria, U Microscopic pos None - Trace   Mucus, UA     Epithelial cells, urine per micros many    Crystals, Ur, HPF, POC     Casts, Ur, LPF, POC     Yeast, UA          Assessment & Plan:   Problem List Items Addressed This Visit       Other   Depression with anxiety   Relevant Medications   traZODone (DESYREL) 50 MG tablet   Sleep disturbance   Other Visit Diagnoses     Frequency of urination        Relevant Orders   POCT urinalysis dipstick (Completed)      Meds ordered this encounter  Medications   traZODone (DESYREL) 50 MG tablet    Sig: Take 0.5-1 tablets (25-50 mg total) by mouth at bedtime as needed for sleep.    Dispense:  30 tablet    Refill:  0    Order Specific Question:   Supervising Provider    Answer:   Sallee Lange A [id number]   Patient defers increasing her escitalopram dose at this point.  Trial of trazodone at nighttime for sleep.  Patient agrees with this plan. Urine poor sample with multiple epithelial cells.  Patient to call back if she develops any pelvic or urinary symptoms. Return for recheck in 4-6 weeks; virtual or phone is fine.

## 2021-06-07 ENCOUNTER — Encounter: Payer: Self-pay | Admitting: Nurse Practitioner

## 2021-06-07 LAB — POCT UA - MICROSCOPIC ONLY
Bacteria, U Microscopic: POSITIVE
WBC, Ur, HPF, POC: >10 (ref 0–5)

## 2021-07-04 ENCOUNTER — Telehealth (INDEPENDENT_AMBULATORY_CARE_PROVIDER_SITE_OTHER): Payer: 59 | Admitting: Family Medicine

## 2021-07-04 ENCOUNTER — Other Ambulatory Visit: Payer: Self-pay

## 2021-07-04 ENCOUNTER — Telehealth: Payer: Self-pay | Admitting: Family Medicine

## 2021-07-04 DIAGNOSIS — F418 Other specified anxiety disorders: Secondary | ICD-10-CM | POA: Diagnosis not present

## 2021-07-04 MED ORDER — TRAZODONE HCL 50 MG PO TABS: 25.0000 mg | ORAL_TABLET | Freq: Every evening | ORAL | 4 refills | Status: DC | PRN

## 2021-07-04 MED ORDER — ESCITALOPRAM OXALATE 10 MG PO TABS: 10.0000 mg | ORAL_TABLET | Freq: Every day | ORAL | 4 refills | Status: DC

## 2021-07-04 NOTE — Telephone Encounter (Signed)
[name], nameth are scheduled for a virtual visit with your provider today.    Just as we do with appointments in the office, we must obtain your consent to participate.  Your consent will be active for this visit and any virtual visit you may have with one of our providers in the next 365 days.    If you have a MyChart account, I can also send a copy of this consent to you electronically.  All virtual visits are billed to your insurance company just like a traditional visit in the office.  As this is a virtual visit, video technology does not allow for your provider to perform a traditional examination.  This may limit your provider's ability to fully assess your condition.  If your provider identifies any concerns that need to be evaluated in person or the need to arrange testing such as labs, EKG, etc, we will make arrangements to do so.    Although advances in technology are sophisticated, we cannot ensure that it will always work on either your end or our end.  If the connection with a video visit is poor, we may have to switch to a telephone visit.  With either a video or telephone visit, we are not always able to ensure that we have a secure connection.   I need to obtain your verbal consent now.   Are you willing to proceed with your visit today?   Melissa Livingston has provided verbal consent on 07/04/2021 for a virtual visit (video or telephone).   Vicente Males, LPN [id number]  [id number] PM

## 2021-07-04 NOTE — Progress Notes (Signed)
   Subjective:    Patient ID: Melissa Livingston, female    DOB: 1996-11-16, 25 y.o.   MRN: [id number] Telephone only video not available HPI Pt following up on anxiety/depression and sleep issues. Pt is taking Trazodone prn and it seems to be helping.  Patient relates she is taking half a trazodone does not seem to help a lot we did talk about she could try the full 50 mg tablet it is unlikely that that would trigger her serotonin syndrome  Virtual Visit via Telephone Note  I connected with [name] on 07/04/21 at  3:50 PM EDT by telephone and verified that I am speaking with the correct person using two identifiers.  Location: Patient: home Provider: office   I discussed the limitations, risks, security and privacy concerns of performing an evaluation and management service by telephone and the availability of in person appointments. I also discussed with the patient that there may be a patient responsible charge related to this service. The patient expressed understanding and agreed to proceed.   History of Present Illness:    Observations/Objective:   Assessment and Plan:   Follow Up Instructions:    I discussed the assessment and treatment plan with the patient. The patient was provided an opportunity to ask questions and all were answered. The patient agreed with the plan and demonstrated an understanding of the instructions.   The patient was advised to call back or seek an in-person evaluation if the symptoms worsen or if the condition fails to improve as anticipated.  I provided  12 minutes of non-face-to-face time during this encounter.     Patient not suicidal  Review of Systems     Objective:   Physical Exam  Today's visit was via telephone Physical exam was not possible for this visit       Assessment & Plan:   Depression and anxiety continue the Lexapro daily Due to the insomnia bump up the dose of the trazodone to 50 mg If any ongoing troubles  problems issues follow-up otherwise recheck within 4 months

## 2021-08-19 ENCOUNTER — Ambulatory Visit: Payer: 59 | Admitting: Family Medicine

## 2021-08-28 ENCOUNTER — Encounter: Payer: Self-pay | Admitting: Family Medicine

## 2021-08-28 ENCOUNTER — Ambulatory Visit (INDEPENDENT_AMBULATORY_CARE_PROVIDER_SITE_OTHER): Payer: 59 | Admitting: Family Medicine

## 2021-08-28 ENCOUNTER — Other Ambulatory Visit: Payer: Self-pay

## 2021-08-28 VITALS — BP 111/71 | HR 85 | Temp 97.1°F | Wt 228.8 lb

## 2021-08-28 DIAGNOSIS — M5432 Sciatica, left side: Secondary | ICD-10-CM | POA: Diagnosis not present

## 2021-08-28 DIAGNOSIS — M778 Other enthesopathies, not elsewhere classified: Secondary | ICD-10-CM | POA: Diagnosis not present

## 2021-08-28 MED ORDER — DICLOFENAC SODIUM 75 MG PO TBEC: 75.0000 mg | DELAYED_RELEASE_TABLET | Freq: Two times a day (BID) | ORAL | 0 refills | Status: DC

## 2021-08-28 NOTE — Patient Instructions (Signed)
We will help set you up for orthopedic referral  Please send [place] an update in 3 to 4 weeks how your back and sciatica are doing  Please remember to do the stretching exercises as we showed you  If you have any further trouble let us now  If you continue to have trouble with your headaches please follow-up for a visit thanks-Dr. Nicki Reaper

## 2021-08-28 NOTE — Progress Notes (Signed)
   Subjective:    Patient ID: Melissa Livingston, female    DOB: 1996-11-14, 25 y.o.   MRN: [id number]  HPI Pt having right shoulder pain. Gets stiff at night and only way she can move shoulder is if she pops her shoulder. Going on for a few months.  Left side lower back pain that radiates to buttock area and sometimes localizes in knee. Going on since last year (during 3rd trimester of pregnancy) Pt headache have seem to worsened. Pt states back of head will become tight along with neck and headache will migrate forward. Pt has been utilizing Tylenol which dulls the pain.   Shoulder pain present for several weeks worse over the past several days hurts with certain movements.  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  The pain is unbearable at times most of the time she takes Tylenol or an anti-inflammatory  She also mentions occasional headaches we did discuss that we advise her to set up an appointment to discuss these further  Review of Systems     Objective:   Physical Exam Cervical spine normal normal range of motion minimal tenderness in the trapezius increased pain with the reaching across with her arm or reaching over her head increased pain with external rotation and with bringing the arm straight up in front of her.       Assessment & Plan:  1. Tendinitis of right shoulder I do not find evidence of rotator cuff tear but I believe that this is a strain or tendinitis.  Could be related to overuse.  Exercises were shown what to do.  Printout was given regarding rotator cuff exercises.  Anti-inflammatory over the next 2 weeks.  Referral to orthopedic surgery  would benefit from more than likely injection possibility of physical therapy if ongoing trouble possible MRI.  We will leave plain x-rays for orthopedics to do - Ambulatory referral to Orthopedic Surgery  2. Sciatica, left side Significant sciatica stretches were shown printout of exercises was given to follow-up if progressive troubles warning signs discussed in detail if this does not get better over the next 6 to 8 weeks consider MRI of the lumbar spine.  I find no evidence of muscle weakness currently.  Conservative exercises anti-inflammatories would be okay  Warned the patient about the increased risk of gastric ulcers with anti-inflammatories and antidepressants and warning signs to watch for  Follow-up for this or any other health problems on a regular basis

## 2021-09-08 ENCOUNTER — Ambulatory Visit (INDEPENDENT_AMBULATORY_CARE_PROVIDER_SITE_OTHER): Payer: 59 | Admitting: Orthopedic Surgery

## 2021-09-08 ENCOUNTER — Other Ambulatory Visit: Payer: Self-pay

## 2021-09-08 ENCOUNTER — Encounter: Payer: Self-pay | Admitting: Orthopedic Surgery

## 2021-09-08 ENCOUNTER — Encounter: Payer: Self-pay | Admitting: Family Medicine

## 2021-09-08 ENCOUNTER — Ambulatory Visit: Payer: 59

## 2021-09-08 VITALS — BP 137/97 | HR 84 | Ht 62.0 in | Wt 229.0 lb

## 2021-09-08 DIAGNOSIS — M7541 Impingement syndrome of right shoulder: Secondary | ICD-10-CM | POA: Diagnosis not present

## 2021-09-08 DIAGNOSIS — M25511 Pain in right shoulder: Secondary | ICD-10-CM

## 2021-09-08 NOTE — Telephone Encounter (Signed)
Symptoms I did review over the note from Dr. Aline Brochure in her chart Unfortunately it does not address this specific issue that the patient raises.  I would recommend a follow-up office visit next week for her with me.  If she is having ongoing troubles with the pain radiating down the arm we will help set up MRI

## 2021-09-08 NOTE — Patient Instructions (Signed)

## 2021-09-08 NOTE — Progress Notes (Signed)
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  She does not note any trauma.  She does note pain in the morning with stiffness and she has difficulty with overhead activities and picking up her 62-year-old  She took over-the-counter Tylenol and ibuprofen and was placed on meloxicam.  She is minimal meloxicam 1 week  Review of Systems  Musculoskeletal:  Positive for joint pain.  All other systems reviewed and are negative.   Past Medical History:  Diagnosis Date   Anemia    Anxiety    Asthma    as child   Endometritis 01/11/2015   GERD (gastroesophageal reflux disease)    Headache    Hematochezia 05/08/2015   History of ovarian cyst 01/28/2015   IBS (irritable bowel syndrome)    Infection    UTI   Nausea and vomiting 01/11/2015   Ovarian cyst 01/14/2015   Pelvic pain in female 01/11/2015   Pregnancy induced hypertension    with pregnancy   Pregnant 02/05/2016   Rectal bleeding 07/18/2013   Past Surgical History:  Procedure Laterality Date   CESAREAN SECTION N/A 09/29/2014   Procedure: CESAREAN SECTION;  Surgeon: Jonnie Kind, MD;  Location: [facility];  Service: Obstetrics;  Laterality: N/A;   CESAREAN SECTION N/A 09/25/2016   Procedure: CESAREAN SECTION;  Surgeon: Jonnie Kind, MD;  Location: [facility];  Service: Obstetrics;  Laterality: N/A;   CESAREAN SECTION N/A 08/25/2020   Procedure: CESAREAN SECTION;  Surgeon: Florian Buff, MD;  Location: [facility];  Service: Obstetrics;  Laterality: N/A;   CHOLECYSTECTOMY N/A 03/07/2018   Procedure: LAPAROSCOPIC CHOLECYSTECTOMY;  Surgeon: Aviva Signs, MD;  Location: [facility];  Service: General;  Laterality: N/A;   COLONOSCOPY WITH PROPOFOL N/A 08/02/2017   Dr. Gala Romney: moderate internal hemorhoids, distal 5 cm of TI also appeared normal   ESOPHAGOGASTRODUODENOSCOPY (EGD) WITH  ESOPHAGEAL DILATION N/A 07/19/2013   Dr. Vivi Ferns appearing esophagus s/p dilation, normal D1 and D2, unremarkable path    ESOPHAGOGASTRODUODENOSCOPY (EGD) WITH PROPOFOL N/A 08/02/2017   Normal esophagus, small hiatal hernia, normal duodenum   none     WISDOM TOOTH EXTRACTION    .  Social History   Tobacco Use   Smoking status: Never   Smokeless tobacco: Never  Vaping Use   Vaping Use: Never used  Substance Use Topics   Alcohol use: No    Alcohol/week: 0.0 standard drinks   Drug use: No   No orders of the defined types were placed in this encounter.  Current Outpatient Medications:    acetaminophen (TYLENOL) 500 MG tablet, Take 2 tablets (1,000 mg total) by mouth every 6 (six) hours as needed for mild pain, moderate pain or headache., Disp: 60 tablet, Rfl: 0   albuterol (VENTOLIN HFA) 108 (90 Base) MCG/ACT inhaler, Inhale 1-2 puffs into the lungs every 6 (six) hours as needed for wheezing or shortness of breath., Disp: 18 g, Rfl: 0   cetirizine (ZYRTEC) 10 MG tablet, Take 1 tablet (10 mg total) by mouth daily. (Patient taking differently: Take 10 mg by mouth as needed.), Disp: 30 tablet, Rfl: 0   diclofenac (VOLTAREN) 75 MG EC tablet, Take 1 tablet (75 mg total) by mouth 2 (two) times daily., Disp: 30 tablet, Rfl: 0   escitalopram (LEXAPRO) 10 MG tablet, Take 1 tablet (10 mg total)  by mouth daily., Disp: 30 tablet, Rfl: 4   etonogestrel (NEXPLANON) 68 MG IMPL implant, 1 each by Subdermal route once., Disp: , Rfl:    ferrous sulfate 325 (65 FE) MG tablet, Take one tablet po once daily, Disp: 60 tablet, Rfl: 3   ondansetron (ZOFRAN) 4 MG tablet, Take 1 tablet (4 mg total) by mouth every 8 (eight) hours as needed for nausea or vomiting., Disp: 20 tablet, Rfl: 1   pantoprazole (PROTONIX) 40 MG tablet, Take 1 tablet (40 mg total) by mouth daily., Disp: 30 tablet, Rfl: 1   SUMAtriptan (IMITREX) 25 MG tablet, Take 1 tablet (25 mg total) by mouth every 2 (two) hours as needed for migraine.  May repeat in 2 hours if headache persists or recurs., Disp: 10 tablet, Rfl: 0   tiZANidine (ZANAFLEX) 2 MG tablet, Take 1 tablet (2 mg total) by mouth every 6 (six) hours as needed for muscle spasms., Disp: 30 tablet, Rfl: 0   traZODone (DESYREL) 50 MG tablet, Take 0.5-1 tablets (25-50 mg total) by mouth at bedtime as needed for sleep., Disp: 30 tablet, Rfl: 4  BP (!) 137/97   Pulse 84   Ht 5\' 2"  (1.575 m)   Wt 229 lb (103.9 kg)   BMI 41.88 kg/m   Physical Exam Vitals and nursing note reviewed.  Constitutional:      General: She is not in acute distress.    Appearance: Normal appearance. She is obese. She is not toxic-appearing.  Skin:    General: Skin is warm and dry.     Findings: No erythema.  Neurological:     General: No focal deficit present.     Mental Status: She is alert and oriented to person, place, and time.     Sensory: No sensory deficit.     Motor: No weakness.  Psychiatric:        Mood and Affect: Mood normal.        Thought Content: Thought content normal.    Right shoulder active motion abduction 90 degrees flexion 100 degrees Passive range of motion abduction 110 flexion 130 Positive impingement at 120 Rotator cuff strength in abduction and flexion 5 out of 5 Normal external rotation with the arm at her side  X-rays were normal  Recommend home exercises Subacromial injection Anti-inflammatories Follow-up as needed  Right shoulder injection Consent was given to inject the right shoulder subacromial space was confirmed by timeout Medication: 6 mg Celestone, 4 cc 1% lidocaine. Technique: The injection was done by cleaning the skin with alcohol and then spraying with ethyl chloride and then 21-gauge needle was used to inject the subacromial space  This was well tolerated without complication

## 2021-10-03 ENCOUNTER — Ambulatory Visit: Payer: Self-pay

## 2021-10-03 ENCOUNTER — Ambulatory Visit
Admission: EM | Admit: 2021-10-03 | Discharge: 2021-10-03 | Disposition: A | Discharge status: Home / Self Care | Payer: 59 | Attending: Urgent Care | Admitting: Urgent Care

## 2021-10-03 ENCOUNTER — Other Ambulatory Visit: Payer: Self-pay

## 2021-10-03 ENCOUNTER — Encounter: Payer: Self-pay | Admitting: Emergency Medicine

## 2021-10-03 DIAGNOSIS — R070 Pain in throat: Secondary | ICD-10-CM | POA: Diagnosis not present

## 2021-10-03 DIAGNOSIS — J069 Acute upper respiratory infection, unspecified: Secondary | ICD-10-CM | POA: Diagnosis not present

## 2021-10-03 LAB — POCT RAPID STREP A (OFFICE): Rapid Strep A Screen: NEGATIVE

## 2021-10-03 MED ORDER — PROMETHAZINE-DM 6.25-15 MG/5ML PO SYRP: 5.0000 mL | ORAL_SOLUTION | Freq: Every evening | ORAL | 0 refills | Status: DC | PRN

## 2021-10-03 MED ORDER — PSEUDOEPHEDRINE HCL 60 MG PO TABS: 60.0000 mg | ORAL_TABLET | Freq: Three times a day (TID) | ORAL | 0 refills | Status: DC | PRN

## 2021-10-03 MED ORDER — CETIRIZINE HCL 10 MG PO TABS: 10.0000 mg | ORAL_TABLET | Freq: Every day | ORAL | 0 refills | Status: DC

## 2021-10-03 MED ORDER — BENZONATATE 100 MG PO CAPS: 100.0000 mg | ORAL_CAPSULE | Freq: Three times a day (TID) | ORAL | 0 refills | Status: DC | PRN

## 2021-10-03 NOTE — ED Triage Notes (Signed)
Patient c/o sore throat x 5 days.   Patient endorses worsening sore throat.   Patient denies fever at home.   Patient endorses productive cough w/ "green" mucus.   Patient has taken Tylenol and Mucinex with no relief of symptoms.

## 2021-10-03 NOTE — Discharge Instructions (Addendum)
We will manage this as a viral illness. For sore throat or cough try using a honey-based tea. Use 3 teaspoons of honey with juice squeezed from half lemon. Place shaved pieces of ginger into 1/2-1 cup of water and warm over stove top. Then mix the ingredients and repeat every 4 hours as needed. Please take ibuprofen 600mg  every 6 hours with food alternating with OR taken together with Tylenol 500mg -650mg  every 6 hours for throat pain, fevers, aches and pains. Hydrate very well with at least 2 liters of water. Eat light meals such as soups (chicken and noodles, vegetable, chicken and wild rice).  Do not eat foods that you are allergic to.  Taking an antihistamine like Zyrtec can help against postnasal drainage, sinus congestion.  You can take this together with pseudoephedrine (Sudafed) at a dose of 30-60 mg 3 times a day or twice daily as needed for the same kind of nasal drip, congestion.  However, limit your use of pseudoephedrine if you have high blood pressure or avoid altogether if you have abnormal heart rhythms, heart condition.

## 2021-10-03 NOTE — ED Provider Notes (Signed)
[place]   MRN: [id number] DOB: [birth year]  Subjective:   Melissa Livingston is a 25 y.o. female presenting for 5-day history of acute onset throat pain, bilateral ear fullness and congestion, productive cough.  No fever, body aches, chest pain or shortness of breath.  Patient has history of childhood asthma.  Has been using Tylenol and Mucinex without any relief.  No current facility-administered medications for this encounter.  Current Outpatient Medications:    escitalopram (LEXAPRO) 10 MG tablet, Take 1 tablet (10 mg total) by mouth daily., Disp: 30 tablet, Rfl: 4   traZODone (DESYREL) 50 MG tablet, Take 0.5-1 tablets (25-50 mg total) by mouth at bedtime as needed for sleep., Disp: 30 tablet, Rfl: 4   acetaminophen (TYLENOL) 500 MG tablet, Take 2 tablets (1,000 mg total) by mouth every 6 (six) hours as needed for mild pain, moderate pain or headache., Disp: 60 tablet, Rfl: 0   albuterol (VENTOLIN HFA) 108 (90 Base) MCG/ACT inhaler, Inhale 1-2 puffs into the lungs every 6 (six) hours as needed for wheezing or shortness of breath., Disp: 18 g, Rfl: 0   cetirizine (ZYRTEC) 10 MG tablet, Take 1 tablet (10 mg total) by mouth daily. (Patient taking differently: Take 10 mg by mouth as needed.), Disp: 30 tablet, Rfl: 0   diclofenac (VOLTAREN) 75 MG EC tablet, Take 1 tablet (75 mg total) by mouth 2 (two) times daily., Disp: 30 tablet, Rfl: 0   etonogestrel (NEXPLANON) 68 MG IMPL implant, 1 each by Subdermal route once., Disp: , Rfl:    ferrous sulfate 325 (65 FE) MG tablet, Take one tablet po once daily, Disp: 60 tablet, Rfl: 3   ondansetron (ZOFRAN) 4 MG tablet, Take 1 tablet (4 mg total) by mouth every 8 (eight) hours as needed for nausea or vomiting., Disp: 20 tablet, Rfl: 1   pantoprazole (PROTONIX) 40 MG tablet, Take 1 tablet (40 mg total) by mouth daily., Disp: 30 tablet, Rfl: 1   SUMAtriptan (IMITREX) 25 MG tablet, Take 1 tablet (25 mg total) by mouth every 2 (two) hours as  needed for migraine. May repeat in 2 hours if headache persists or recurs., Disp: 10 tablet, Rfl: 0   tiZANidine (ZANAFLEX) 2 MG tablet, Take 1 tablet (2 mg total) by mouth every 6 (six) hours as needed for muscle spasms., Disp: 30 tablet, Rfl: 0   Allergies  Allergen Reactions   Adhesive [Tape] Other (See Comments)    Blisters    Lorabid [Loracarbef] Hives, Swelling and Other (See Comments)    Mouth swelling Can take amoxil and augmentin    Latex Itching and Rash   Orange Fruit [Citrus] Rash    Past Medical History:  Diagnosis Date   Anemia    Anxiety    Asthma    as child   Endometritis 01/11/2015   GERD (gastroesophageal reflux disease)    Headache    Hematochezia 05/08/2015   History of ovarian cyst 01/28/2015   IBS (irritable bowel syndrome)    Infection    UTI   Nausea and vomiting 01/11/2015   Ovarian cyst 01/14/2015   Pelvic pain in female 01/11/2015   Pregnancy induced hypertension    with pregnancy   Pregnant 02/05/2016   Rectal bleeding 07/18/2013     Past Surgical History:  Procedure Laterality Date   CESAREAN SECTION N/A 09/29/2014   Procedure: CESAREAN SECTION;  Surgeon: Jonnie Kind, MD;  Location: [facility];  Service: Obstetrics;  Laterality: N/A;   CESAREAN SECTION N/A  09/25/2016   Procedure: CESAREAN SECTION;  Surgeon: Jonnie Kind, MD;  Location: [facility];  Service: Obstetrics;  Laterality: N/A;   CESAREAN SECTION N/A 08/25/2020   Procedure: CESAREAN SECTION;  Surgeon: Florian Buff, MD;  Location: [facility];  Service: Obstetrics;  Laterality: N/A;   CHOLECYSTECTOMY N/A 03/07/2018   Procedure: LAPAROSCOPIC CHOLECYSTECTOMY;  Surgeon: Aviva Signs, MD;  Location: [facility];  Service: General;  Laterality: N/A;   COLONOSCOPY WITH PROPOFOL N/A 08/02/2017   Dr. Gala Romney: moderate internal hemorhoids, distal 5 cm of TI also appeared normal   ESOPHAGOGASTRODUODENOSCOPY (EGD) WITH ESOPHAGEAL DILATION N/A 07/19/2013   Dr. Vivi Ferns appearing esophagus s/p  dilation, normal D1 and D2, unremarkable path    ESOPHAGOGASTRODUODENOSCOPY (EGD) WITH PROPOFOL N/A 08/02/2017   Normal esophagus, small hiatal hernia, normal duodenum   none     WISDOM TOOTH EXTRACTION      Family History  Problem Relation Age of Onset   Multiple sclerosis Mother    Heart disease Maternal Grandfather    Heart disease Paternal Uncle    Diabetes Paternal Uncle    Hypertension Paternal Uncle    Cancer Other        father's aunt had breast cancer   Hypertension Father    Colon cancer Other        maternal great uncle, age greater than 12   Crohn's disease Other        couple of family members on father's side of family   Hypertension Paternal Aunt     Social History   Tobacco Use   Smoking status: Never   Smokeless tobacco: Never  Vaping Use   Vaping Use: Never used  Substance Use Topics   Alcohol use: No    Alcohol/week: 0.0 standard drinks   Drug use: No    ROS   Objective:   Vitals: BP 124/81 (BP Location: Right Arm)   Pulse 97   Temp 99.1 F (37.3 C) (Oral)   Resp 14   LMP 08/20/2021 (Approximate)   SpO2 96%   Breastfeeding No   Physical Exam Constitutional:      General: She is not in acute distress.    Appearance: Normal appearance. She is well-developed. She is not ill-appearing, toxic-appearing or diaphoretic.  HENT:     Head: Normocephalic and atraumatic.     Right Ear: Ear canal normal. No drainage or tenderness. No middle ear effusion. Tympanic membrane is not erythematous.     Left Ear: Ear canal normal. No drainage or tenderness.  No middle ear effusion. Tympanic membrane is not erythematous.     Ears:     Comments: TMs opacified bilaterally.    Nose: Nose normal. No congestion or rhinorrhea.     Mouth/Throat:     Mouth: Mucous membranes are moist. No oral lesions.     Pharynx: Oropharynx is clear. No pharyngeal swelling, oropharyngeal exudate, posterior oropharyngeal erythema or uvula swelling.     Tonsils: No tonsillar  exudate or tonsillar abscesses.  Eyes:     Extraocular Movements: Extraocular movements intact.     Right eye: Normal extraocular motion.     Left eye: Normal extraocular motion.     Conjunctiva/sclera: Conjunctivae normal.     Pupils: Pupils are equal, round, and reactive to light.  Cardiovascular:     Rate and Rhythm: Normal rate and regular rhythm.     Pulses: Normal pulses.     Heart sounds: Normal heart sounds. No murmur heard.   No  friction rub. No gallop.  Pulmonary:     Effort: Pulmonary effort is normal. No respiratory distress.     Breath sounds: Normal breath sounds. No stridor. No wheezing, rhonchi or rales.  Musculoskeletal:     Cervical back: Normal range of motion and neck supple.  Lymphadenopathy:     Cervical: No cervical adenopathy.  Skin:    General: Skin is warm and dry.     Findings: No rash.  Neurological:     General: No focal deficit present.     Mental Status: She is alert and oriented to person, place, and time.  Psychiatric:        Mood and Affect: Mood normal.        Behavior: Behavior normal.        Thought Content: Thought content normal.    Results for orders placed or performed during the hospital encounter of 10/03/21 (from the past 24 hour(s))  POCT rapid strep A     Status: None   Collection Time: 10/03/21  9:02 AM  Result Value Ref Range   Rapid Strep A Screen Negative Negative    Assessment and Plan :   PDMP not reviewed this encounter.  1. Viral URI with cough   2. Throat pain    Strep culture pending.  Recommended management for viral respiratory infection.  Use supportive care.  Patient declined COVID test.  Low suspicion for the flu and therefore will defer testing. Counseled patient on potential for adverse effects with medications prescribed/recommended today, ER and return-to-clinic precautions discussed, patient verbalized understanding.    Jaynee Eagles, [place] 10/03/21 [phone]

## 2021-10-06 LAB — CULTURE, GROUP A STREP (THRC)

## 2022-01-02 ENCOUNTER — Ambulatory Visit: Payer: 59 | Admitting: Nurse Practitioner

## 2022-01-02 ENCOUNTER — Telehealth: Payer: Self-pay | Admitting: Family Medicine

## 2022-01-02 MED ORDER — SUMATRIPTAN SUCCINATE 25 MG PO TABS: 25.0000 mg | ORAL_TABLET | ORAL | 0 refills | Status: DC | PRN

## 2022-01-02 NOTE — Telephone Encounter (Signed)
Nurses-please talk with patient.  If she feels it is migraines I would recommend sumatriptan hand if she needs a refill she may have a refill If it is not migraines I would recommend OTC Tylenol or ibuprofen It is not possible to diagnose headaches via phone message recommend follow-up office visit

## 2022-01-02 NOTE — Telephone Encounter (Signed)
Patient had to cancel appointment this for migraines couldn't get off work so she rescheduled until Monday .[place]

## 2022-01-02 NOTE — Telephone Encounter (Signed)
So noted 

## 2022-01-02 NOTE — Telephone Encounter (Signed)
Patient is wanting something for headaches until seen on 01/05/22 called into [facility]

## 2022-01-02 NOTE — Telephone Encounter (Signed)
Patient informed of md message and instructions. Pt states she has an appointment on Monday 01/05/22. Refill on the sumatriptan sent to pharmacy.

## 2022-01-05 ENCOUNTER — Ambulatory Visit: Payer: 59 | Admitting: Nurse Practitioner

## 2022-01-07 ENCOUNTER — Other Ambulatory Visit: Payer: Self-pay

## 2022-01-07 ENCOUNTER — Encounter: Payer: Self-pay | Admitting: Nurse Practitioner

## 2022-01-07 ENCOUNTER — Ambulatory Visit (INDEPENDENT_AMBULATORY_CARE_PROVIDER_SITE_OTHER): Payer: 59 | Admitting: Nurse Practitioner

## 2022-01-07 VITALS — BP 128/85 | HR 77 | Temp 98.8°F | Ht 62.0 in | Wt 223.4 lb

## 2022-01-07 DIAGNOSIS — D509 Iron deficiency anemia, unspecified: Secondary | ICD-10-CM | POA: Diagnosis not present

## 2022-01-07 DIAGNOSIS — G43919 Migraine, unspecified, intractable, without status migrainosus: Secondary | ICD-10-CM | POA: Diagnosis not present

## 2022-01-07 MED ORDER — SUMATRIPTAN SUCCINATE 50 MG PO TABS: 50.0000 mg | ORAL_TABLET | Freq: Four times a day (QID) | ORAL | 0 refills | Status: DC | PRN

## 2022-01-07 MED ORDER — TOPIRAMATE 25 MG PO TABS: 25.0000 mg | ORAL_TABLET | Freq: Two times a day (BID) | ORAL | 1 refills | Status: DC

## 2022-01-07 NOTE — Progress Notes (Signed)
Subjective:    Patient ID: Melissa Livingston, female    DOB: Jul 28, 1996, 28 y.o.   MRN: [id number]  HPI Patient reports to the clinic for migraines over 8 days.  Patient states that she also has nausea light sensitivity, noise sensitivity with her migraines.  Migraines are relieved temporarily with Imitrex and rest for about 4 to 5 hours.  However after 4-5 hours patient states that headache comes back stronger.  Patient states she has also been using Tylenol and Tylenol PM for pain relief with which helps some.  Patient also concerned stating that she has had trouble remembering details.  Patient denies any vomiting or vision changes.  Patient does admit to possible aura where she will smell something or have black spots before a migraine.    Patient admits that she has yearly eye exams and she is due for annual eye exam in March.  Patient had Nexplanon placed in 2021 and states that her migraines have become increasingly worse since that time.   Review of Systems  Gastrointestinal:  Positive for nausea.  Neurological:  Positive for headaches.  All other systems reviewed and are negative.     Objective:   Physical Exam Constitutional:      General: She is not in acute distress.    Appearance: Normal appearance. She is normal weight. She is not ill-appearing or toxic-appearing.  HENT:     Head: Normocephalic.  Eyes:     General: No visual field deficit. Cardiovascular:     Rate and Rhythm: Normal rate and regular rhythm.     Pulses: Normal pulses.     Heart sounds: No murmur heard. Pulmonary:     Effort: Pulmonary effort is normal. No respiratory distress.     Breath sounds: Normal breath sounds. No wheezing.  Skin:    General: Skin is warm.     Capillary Refill: Capillary refill takes less than 2 seconds.  Neurological:     General: No focal deficit present.     Mental Status: She is alert and oriented to person, place, and time.     Cranial Nerves: No cranial nerve deficit,  dysarthria or facial asymmetry.     Sensory: Sensation is intact. No sensory deficit.     Motor: Motor function is intact. No weakness, tremor, atrophy, abnormal muscle tone, seizure activity or pronator drift.     Coordination: Coordination is intact. Romberg sign negative. Coordination normal. Heel to Shin Test normal.     Gait: Gait is intact. Gait normal.  Psychiatric:        Mood and Affect: Mood normal.          Assessment & Plan:   1. Intractable migraine without status migrainosus, unspecified migraine type -Possible migraines related to birth control method (Nexplanon). -We will increase sumatriptan to 50 mg and will attempt preventive therapy with topiramate. -Possible slight reaction between topiramate and Nexplanon or topiramate may decrease the effectiveness of to prevent pregnancies.  Patient encouraged to wear condoms while using topiramate. - SUMAtriptan (IMITREX) 50 MG tablet; Take 1 tablet (50 mg total) by mouth 4 (four) times daily as needed for migraine (Do not exceed 200mg  a day). May repeat in 2 hours if headache persists or recurs.  Dispense: 10 tablet; Refill: 0 - topiramate (TOPAMAX) 25 MG tablet; Take 1 tablet (25 mg total) by mouth 2 (two) times daily.  Dispense: 30 tablet; Refill: 1 -If headaches persist despite use of Imitrex 50 mg and topiramate 25 mg, may  consider Nexplanon removal and utilizing a different birth control method. -Return to clinic in 4 weeks for evaluation -Mini exam completed.  No changes to cognition noted.  Believe forgetfulness is probably related to pain as her headaches or possible depression anxiety. - PHQ-9 = 12 today - GAD-7 = 9 today

## 2022-01-08 LAB — IRON AND TIBC
Iron Saturation: 14 % — ABNORMAL LOW (ref 15–55)
Iron: 53 ug/dL (ref 27–159)
Total Iron Binding Capacity: 392 ug/dL (ref 250–450)
UIBC: 339 ug/dL (ref 131–425)

## 2022-01-08 LAB — FERRITIN: Ferritin: 15 ng/mL (ref 15–150)

## 2022-02-04 ENCOUNTER — Ambulatory Visit: Payer: 59 | Admitting: Nurse Practitioner

## 2022-02-05 ENCOUNTER — Ambulatory Visit (HOSPITAL_COMMUNITY)
Admission: RE | Admit: 2022-02-05 | Discharge: 2022-02-05 | Disposition: A | Discharge status: Home / Self Care | Payer: 59 | Source: Ambulatory Visit | Attending: Nurse Practitioner | Admitting: Nurse Practitioner

## 2022-02-05 ENCOUNTER — Encounter: Payer: Self-pay | Admitting: Nurse Practitioner

## 2022-02-05 ENCOUNTER — Ambulatory Visit (INDEPENDENT_AMBULATORY_CARE_PROVIDER_SITE_OTHER): Payer: 59 | Admitting: Nurse Practitioner

## 2022-02-05 ENCOUNTER — Other Ambulatory Visit: Payer: Self-pay

## 2022-02-05 VITALS — BP 132/74 | HR 72 | Temp 98.2°F | Wt 220.0 lb

## 2022-02-05 DIAGNOSIS — S99911A Unspecified injury of right ankle, initial encounter: Secondary | ICD-10-CM | POA: Diagnosis not present

## 2022-02-05 DIAGNOSIS — F418 Other specified anxiety disorders: Secondary | ICD-10-CM

## 2022-02-05 DIAGNOSIS — S93401A Sprain of unspecified ligament of right ankle, initial encounter: Secondary | ICD-10-CM | POA: Diagnosis not present

## 2022-02-05 DIAGNOSIS — G43919 Migraine, unspecified, intractable, without status migrainosus: Secondary | ICD-10-CM

## 2022-02-05 MED ORDER — ESCITALOPRAM OXALATE 20 MG PO TABS
20.0000 mg | ORAL_TABLET | Freq: Every day | ORAL | 3 refills | Status: DC
Start: 2022-02-05 — End: 2022-12-16

## 2022-02-05 NOTE — Progress Notes (Signed)
? ?Subjective:  ? ? Patient ID: Melissa Livingston, female    DOB: January 06, 1996, 26 y.o.   MRN: [id number] ? ?HPI ? ?Pt here to follow up on migraines. Pt states they are about the same as 01/07/22 visit. Pt is taking Imitrex PRN and Topamax BID (one in the morning and one at night). Pt is having to take Imitrex on a daily basis. Pt states her migraines are not in the same place; sometimes on top of head, sometimes at temple and sometimes at base of head.  Patient states that she believes her headaches are stress related related to her work.  Patient states that her headaches are more intense when she is at work.  Initially patient thought that headaches were related to Nexplanon however patient states that she does not think that anymore and really believe that headaches are related to her work.  Patient admits to nausea, ringing in her right ear occasionally, dizziness which she feels like the she is spinning, difficulty expressing her thoughts and getting her words out patient denies vomiting, decreased appetite, feeling the worst headache of her life, nighttime headaches, or changes to vision. ? ?Patient admits to having an anxiety attack on Friday where her headaches were also intense. ? ?Pt feels like she sprained her right ankle on Friday (x5 days ago). Pt slid off curb while trying to get in car at work last Friday. Ankle is swollen and pt has limited ROM.  ? ? ?Review of Systems  ?HENT:  Positive for tinnitus.   ?Gastrointestinal:  Positive for nausea.  ?Musculoskeletal:   ?     Right ankle pain  ?Neurological:  Positive for dizziness and headaches.  ? ?   ?Objective:  ? Physical Exam ?Constitutional:   ?   Appearance: Normal appearance.  ?HENT:  ?   Head: Normocephalic and atraumatic.  ?Cardiovascular:  ?   Rate and Rhythm: Normal rate and regular rhythm.  ?   Pulses: Normal pulses.  ?   Heart sounds: Normal heart sounds. No murmur heard. ?Pulmonary:  ?   Effort: Pulmonary effort is normal. No respiratory distress.  ?    Breath sounds: Normal breath sounds. No wheezing.  ?Musculoskeletal:     ?   General: Normal range of motion.  ?Skin: ?   General: Skin is warm.  ?   Capillary Refill: Capillary refill takes less than 2 seconds.  ?Neurological:  ?   General: No focal deficit present.  ?   Mental Status: She is alert and oriented to person, place, and time.  ?   Cranial Nerves: No cranial nerve deficit.  ?   Sensory: No sensory deficit.  ?   Motor: No weakness.  ?   Coordination: Coordination normal.  ?   Gait: Gait normal.  ?Psychiatric:     ?   Mood and Affect: Mood normal.     ?   Behavior: Behavior normal.  ? ? ? ? ? ?   ?Assessment & Plan:  ? ?1. Intractable migraine without status migrainosus, unspecified migraine type ?-Topiramate and Imitrex did not relieve headaches ?-We will refer patient to neurology for further evaluation. ?-Headaches possibly related to depression and anxiety and stress however due to symptoms feel like it is important for neurology to evaluate ?- Ambulatory referral to Neurology ?-Return to clinic in 6 weeks for follow-up.  May return sooner if headaches intensify or or if you experience new symptoms ? ?2. Depression with anxiety ?-We will increase Lexapro from  10 mg to 20 mg. ?- Ambulatory referral to Psychiatry ?- escitalopram (LEXAPRO) 20 MG tablet; Take 1 tablet (20 mg total) by mouth daily.  Dispense: 30 tablet; Refill: 3 ?-Patient taking trazodone.  Discussed signs and symptoms of serotonin syndrome.  Patient encouraged to monitor for signs symptoms of serotonin syndrome especially after increasing Lexapro to 20 mg.  Patient to contact clinic if she experiences any symptoms. ?-PHQ-9 score today is 10 ?-GAD-7 score today is 4 ?-Return to clinic in 6 weeks ? ?3. Injury of right ankle, initial encounter ?-Possible ankle sprain. ?-We will get x-ray to rule out any fractures. ?- DG Ankle Complete Right ?-May use warm and hot compresses for pain ?-May use Voltaren medication for pain ?-May use  ibuprofen however must stop use of Voltaren before using ibuprofen. ? ?

## 2022-02-18 DIAGNOSIS — G43711 Chronic migraine without aura, intractable, with status migrainosus: Secondary | ICD-10-CM | POA: Diagnosis not present

## 2022-02-18 DIAGNOSIS — G44221 Chronic tension-type headache, intractable: Secondary | ICD-10-CM | POA: Diagnosis not present

## 2022-02-18 DIAGNOSIS — F5104 Psychophysiologic insomnia: Secondary | ICD-10-CM | POA: Diagnosis not present

## 2022-03-02 ENCOUNTER — Other Ambulatory Visit: Payer: Self-pay | Admitting: Nurse Practitioner

## 2022-03-02 ENCOUNTER — Encounter: Payer: Self-pay | Admitting: Nurse Practitioner

## 2022-03-02 DIAGNOSIS — S99911A Unspecified injury of right ankle, initial encounter: Secondary | ICD-10-CM

## 2022-03-05 ENCOUNTER — Ambulatory Visit: Payer: 59

## 2022-03-05 ENCOUNTER — Ambulatory Visit (INDEPENDENT_AMBULATORY_CARE_PROVIDER_SITE_OTHER): Payer: 59 | Admitting: Orthopaedic Surgery

## 2022-03-05 ENCOUNTER — Encounter: Payer: Self-pay | Admitting: Orthopaedic Surgery

## 2022-03-05 VITALS — BP 134/89 | HR 81 | Ht 62.0 in | Wt 220.0 lb

## 2022-03-05 DIAGNOSIS — M25571 Pain in right ankle and joints of right foot: Secondary | ICD-10-CM | POA: Diagnosis not present

## 2022-03-05 NOTE — Progress Notes (Signed)
My ankle hurts on the right. ? ?She stepped off a curb and hurt her right ankle 01-31-22.  She was seen in the ER for this on 02-05-22 and X-rays were done.  X-rays were negative.  I have reviewed the notes and films.  She has continued pain of the right ankle laterally.  She has swelling.  She has used ice and elevation and Tylenol.  She continues to have pain.  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  She has no new trauma. ? ?Right ankle has some lateral swelling, some tenderness over the anterior talofibular ligament, ROM full, slight limp left, NV intact, no redness, no edema. ? ?I have independently reviewed and interpreted x-rays of this patient done at another site by another physician or qualified health professional. ? ?I repeated the X-rays of the right ankle, reported separately.  Negative. ? ?Encounter Diagnosis  ?Name Primary?  ? Pain in right ankle and joints of right foot Yes  ? ?I will give ankle brace, lace up. ? ?Use Aspercreme, Biofreeze or Voltaren Gel to the ankle. ? ?Elevate as needed. ? ?Return in two weeks. ? ?Call if any problem. ? ?Precautions discussed. ? ?Electronically Signed ?Sanjuana Kava, MD ?3/30/202310:31 AM ? ?   ?

## 2022-03-18 ENCOUNTER — Telehealth: Payer: Self-pay

## 2022-03-18 NOTE — Telephone Encounter (Signed)
Okay to provide work note.  ? ?Thank you  ? ?Dr. Lenna Sciara  ?

## 2022-03-18 NOTE — Telephone Encounter (Signed)
[place]  ? ?On DPR? :Yes ? ?Call back number:[phone] ? ?Provider they see: Melissa Livingston  [name])    ? ?Reason for call:Pt daughter Melissa Livingston) at side door today and pt is wanting a work excuse for her to be out the rest of the week to take care of her daughter. ? ?

## 2022-03-18 NOTE — Telephone Encounter (Signed)
Please advise. Thank you

## 2022-03-19 ENCOUNTER — Ambulatory Visit (INDEPENDENT_AMBULATORY_CARE_PROVIDER_SITE_OTHER): Payer: 59 | Admitting: Nurse Practitioner

## 2022-03-19 ENCOUNTER — Encounter: Payer: Self-pay | Admitting: Orthopaedic Surgery

## 2022-03-19 ENCOUNTER — Ambulatory Visit (INDEPENDENT_AMBULATORY_CARE_PROVIDER_SITE_OTHER): Payer: 59 | Admitting: Orthopaedic Surgery

## 2022-03-19 ENCOUNTER — Encounter: Payer: Self-pay | Admitting: Family Medicine

## 2022-03-19 ENCOUNTER — Encounter: Payer: Self-pay | Admitting: Nurse Practitioner

## 2022-03-19 VITALS — BP 131/82 | HR 81 | Temp 98.1°F | Wt 219.8 lb

## 2022-03-19 DIAGNOSIS — F418 Other specified anxiety disorders: Secondary | ICD-10-CM

## 2022-03-19 DIAGNOSIS — Z862 Personal history of diseases of the blood and blood-forming organs and certain disorders involving the immune mechanism: Secondary | ICD-10-CM | POA: Diagnosis not present

## 2022-03-19 DIAGNOSIS — G43919 Migraine, unspecified, intractable, without status migrainosus: Secondary | ICD-10-CM

## 2022-03-19 DIAGNOSIS — M25571 Pain in right ankle and joints of right foot: Secondary | ICD-10-CM

## 2022-03-19 NOTE — Progress Notes (Signed)
My ankle is better. ? ?She has less pain of the right ankle laterally now.  She has been using the brace and the rubs. She has no new trauma.  She has much less pain but more at the end of the job now.  She is walking better.  She has no redness. ? ?Right ankle still has some slight tenderness of the anterior talofibular ligament area and slight lateral swelling but full ROM.  NV intact.  Gait is good. ? ?Encounter Diagnosis  ?Name Primary?  ? Pain in right ankle and joints of right foot Yes  ? ?Continue the brace but gradually try to come out of it. ? ?Return in two weeks.  If doing well, call and cancel. ? ?Call if any problem. ? ?Precautions discussed. ? ?Electronically Signed ?Sanjuana Kava, MD ?4/13/20237:57 AM ? ?

## 2022-03-19 NOTE — Progress Notes (Signed)
? ?Subjective:  ? ? Patient ID: Melissa Livingston, female    DOB: 03/18/1996, 26 y.o.   MRN: [id number] ? ?HPI ? ?26 year old female with history of hypertension, anxiety, depression, and migraines here today to follow-up on headaches.  During last visit Lexapro dosage was increased.  With the thought that migraines may be related to depression and anxiety.  Today, patient states that headaches are doing a lot better and she is not had to take her Imitrex as often.  Patient also states that she is tolerating Lexapro 20 mg well without any issues.  Patient denies experiencing any side effects.  ? ?Patient does admit to having chronic fatigue and was found that at one point she had iron deficiency anemia.  Ferritin level was 15 2 months ago.  Patient states that she has been taking her iron supplements as prescribed but states that she still feels fatigued.  Patient also states that she sometimes gets short of breath when going up 3 flights of stairs to her apartment.  Patient denies any chest palpitations or lightheadedness.   ? ?Patient is also been making several lifestyle changes to help with weight loss.  Patient states that she has lost approximately 15 pounds so far.  Patient states that she is walking a lot more and has made changes to her diet and has decreased her portion sizes.  However patient states that she is slightly frustrated because it feels like she has reached a plateau.  Patient would like to know if there is anything else she can do to help with weight loss  ? ? ?Review of Systems  ?Constitutional:  Positive for fatigue.  ?Respiratory:    ?     Short of breath on exertion  ?All other systems reviewed and are negative. ? ?   ?Objective:  ? Physical Exam ?Vitals reviewed.  ?Constitutional:   ?   General: She is not in acute distress. ?   Appearance: Normal appearance. She is obese. She is not ill-appearing, toxic-appearing or diaphoretic.  ?Cardiovascular:  ?   Rate and Rhythm: Normal rate and regular  rhythm.  ?   Pulses: Normal pulses.  ?   Heart sounds: Normal heart sounds. No murmur heard. ?Pulmonary:  ?   Effort: Pulmonary effort is normal. No respiratory distress.  ?   Breath sounds: Normal breath sounds. No wheezing.  ?Musculoskeletal:  ?   Comments: Grossly intact  ?Skin: ?   General: Skin is warm.  ?   Capillary Refill: Capillary refill takes less than 2 seconds.  ?Neurological:  ?   Mental Status: She is alert.  ?   Comments: Grossly intact  ?Psychiatric:     ?   Mood and Affect: Mood normal.     ?   Behavior: Behavior normal.  ? ? ?   ?Assessment & Plan:  ? ?1. Intractable migraine without status migrainosus, unspecified migraine type ?-Migraines seem to be better with increase of Lexapro. ?-Suspect that migraines are linked to patient's depression and anxiety. ?-Continue to take topiramate as prescribed if helpful ?-Continue to use Imitrex as needed. ?-Return to clinic in 6 months or sooner if needed ? ?2. Depression with anxiety ?-Ambulatory referral to psychiatry was placed during last patient visit.  The referral apparently was denied with no provider notification. ?-Another referral to psychiatry is placed today. ?-Continue taking Lexapro 20 mg as prescribed. ?-Continue to monitor for side effects ?- Ambulatory referral to Psychiatry ?-Return to clinic in 6 months. ? ?3.  History of anemia ?-Due to continued complaints of fatigue and occasional shortness of breath on exertion will reevaluate patient's CBC and iron studies. ?- CBC with Differential/Platelet ?- Fe+TIBC+Fer ?-If lab work within normal limits patient to follow-up in 6 months or sooner if needed.  However if lab work abnormal we will bring patient in sooner. ? ?4.  Morbid obesity ?-Patient to continue lifestyle changes. ?-Discussed that weight loss is a long journey and to continue to keep doing her lifestyle changes ?-Encourage patient to set small goals of 3 to 5 pound increments at a time. ?-Goal weight at this time is 164 pounds for  a BMI of 30 or less. ?-Discussed phentermine as an option.  Patient to research and notify provider if that is something she would like to do. ?-Return to clinic in 6 months ? ? ?Note:  This document was prepared using Dragon voice recognition software and may include unintentional dictation errors. ? ? ? ?

## 2022-03-20 LAB — CBC WITH DIFFERENTIAL/PLATELET
Basophils Absolute: 0 10*3/uL (ref 0.0–0.2)
Basos: 0 %
EOS (ABSOLUTE): 0.1 10*3/uL (ref 0.0–0.4)
Eos: 1 %
Hematocrit: 39.5 % (ref 34.0–46.6)
Hemoglobin: 12.4 g/dL (ref 11.1–15.9)
Immature Grans (Abs): 0 10*3/uL (ref 0.0–0.1)
Immature Granulocytes: 0 %
Lymphocytes Absolute: 1.7 10*3/uL (ref 0.7–3.1)
Lymphs: 22 %
MCH: 23.6 pg — ABNORMAL LOW (ref 26.6–33.0)
MCHC: 31.4 g/dL — ABNORMAL LOW (ref 31.5–35.7)
MCV: 75 fL — ABNORMAL LOW (ref 79–97)
Monocytes Absolute: 0.5 10*3/uL (ref 0.1–0.9)
Monocytes: 6 %
Neutrophils Absolute: 5.4 10*3/uL (ref 1.4–7.0)
Neutrophils: 71 %
Platelets: 418 10*3/uL (ref 150–450)
RBC: 5.26 x10E6/uL (ref 3.77–5.28)
RDW: 14.5 % (ref 11.7–15.4)
WBC: 7.7 10*3/uL (ref 3.4–10.8)

## 2022-03-20 LAB — IRON,TIBC AND FERRITIN PANEL
Ferritin: 21 ng/mL (ref 15–150)
Iron Saturation: 16 % (ref 15–55)
Iron: 58 ug/dL (ref 27–159)
Total Iron Binding Capacity: 372 ug/dL (ref 250–450)
UIBC: 314 ug/dL (ref 131–425)

## 2022-03-23 ENCOUNTER — Encounter: Payer: Self-pay | Admitting: Nurse Practitioner

## 2022-03-23 NOTE — Telephone Encounter (Signed)
Nurses-please forward back to [facility] you ?

## 2022-03-25 ENCOUNTER — Other Ambulatory Visit: Payer: Self-pay | Admitting: Nurse Practitioner

## 2022-03-25 MED ORDER — PHENTERMINE HCL 15 MG PO CAPS: 15.0000 mg | ORAL_CAPSULE | ORAL | 1 refills | Status: DC

## 2022-03-30 ENCOUNTER — Other Ambulatory Visit: Payer: Self-pay | Admitting: Nurse Practitioner

## 2022-03-30 DIAGNOSIS — G43919 Migraine, unspecified, intractable, without status migrainosus: Secondary | ICD-10-CM

## 2022-03-30 MED ORDER — SUMATRIPTAN SUCCINATE 50 MG PO TABS: 50.0000 mg | ORAL_TABLET | Freq: Four times a day (QID) | ORAL | 3 refills | Status: DC | PRN

## 2022-04-02 ENCOUNTER — Ambulatory Visit: Payer: 59 | Admitting: Orthopaedic Surgery

## 2022-04-28 ENCOUNTER — Encounter: Payer: Self-pay | Admitting: Nurse Practitioner

## 2022-04-28 DIAGNOSIS — G43919 Migraine, unspecified, intractable, without status migrainosus: Secondary | ICD-10-CM

## 2022-05-01 MED ORDER — TOPIRAMATE 50 MG PO TABS: ORAL_TABLET | ORAL | 1 refills | Status: DC

## 2022-05-01 NOTE — Telephone Encounter (Signed)
Nurses As you are aware Docia Barrier is out currently As for topiramate-Reagyn may go up to 50 mg twice daily Please send a new prescription for #60 with 1 refill  Recommend that she set up a follow-up visit with Docia Barrier in June  Thanks-Dr. Nicki Reaper  Minimize and avoid caffeine's, patient may help the process by also keeping a headache diary regarding the frequency of her headaches, how long they last for, what she takes for them, bring these with her to her office visit thank you

## 2022-07-02 ENCOUNTER — Other Ambulatory Visit: Payer: Self-pay | Admitting: Family Medicine

## 2022-07-02 DIAGNOSIS — G43919 Migraine, unspecified, intractable, without status migrainosus: Secondary | ICD-10-CM

## 2022-07-28 ENCOUNTER — Ambulatory Visit (INDEPENDENT_AMBULATORY_CARE_PROVIDER_SITE_OTHER): Payer: 59 | Admitting: Adult Health

## 2022-07-28 ENCOUNTER — Other Ambulatory Visit (HOSPITAL_COMMUNITY)
Admission: RE | Admit: 2022-07-28 | Discharge: 2022-07-28 | Disposition: A | Discharge status: Home / Self Care | Payer: 59 | Source: Ambulatory Visit | Attending: Adult Health | Admitting: Adult Health

## 2022-07-28 ENCOUNTER — Ambulatory Visit: Payer: 59 | Admitting: Nurse Practitioner

## 2022-07-28 ENCOUNTER — Encounter: Payer: Self-pay | Admitting: Adult Health

## 2022-07-28 VITALS — BP 122/79 | HR 99 | Ht 62.5 in | Wt 199.0 lb

## 2022-07-28 DIAGNOSIS — Z975 Presence of (intrauterine) contraceptive device: Secondary | ICD-10-CM | POA: Diagnosis not present

## 2022-07-28 DIAGNOSIS — N941 Unspecified dyspareunia: Secondary | ICD-10-CM | POA: Diagnosis not present

## 2022-07-28 DIAGNOSIS — Z01419 Encounter for gynecological examination (general) (routine) without abnormal findings: Secondary | ICD-10-CM | POA: Insufficient documentation

## 2022-07-28 DIAGNOSIS — R1030 Lower abdominal pain, unspecified: Secondary | ICD-10-CM | POA: Insufficient documentation

## 2022-07-28 MED ORDER — TIZANIDINE HCL 2 MG PO TABS: 2.0000 mg | ORAL_TABLET | Freq: Four times a day (QID) | ORAL | 0 refills | Status: DC | PRN

## 2022-07-28 NOTE — Progress Notes (Signed)
Patient ID: Melissa Livingston, female   DOB: 1996/07/23, 26 y.o.   MRN: [id number] History of Present Illness: [place] is a 26 year old black female, with SO, G3P3 in for a well woman gyn exam and pap. She has pain with sex, esp week before period.  She has nexplanon. PCP is Dr Sallee Lange.   Current Medications, Allergies, Past Medical History, Past Surgical History, Family History and Social History were reviewed in [employer] record.     Review of Systems: Patient denies any headaches, hearing loss, fatigue, blurred vision, shortness of breath, chest pain, abdominal pain, problems with bowel movements, urination. No joint pain or mood swings.  See HPI for positives.   Physical Exam:BP 122/79 (BP Location: Left Arm, Patient Position: Sitting, Cuff Size: Normal)   Pulse 99   Ht 5' 2.5" (1.588 m)   Wt 199 lb (90.3 kg)   LMP 07/05/2022 (Approximate)   Breastfeeding No   BMI 35.82 kg/m   General:  Well developed, well nourished, no acute distress Skin:  Warm and dry Neck:  Midline trachea, normal thyroid, good ROM, no lymphadenopathy Lungs; Clear to auscultation bilaterally Breast:  No dominant palpable mass, retraction, or nipple discharge Cardiovascular: Regular rate and rhythm Abdomen:  Soft, non tender, no hepatosplenomegaly Pelvic:  External genitalia is normal in appearance, no lesions.  The vagina is normal in appearance. Urethra has no lesions or masses. The cervix is smooth, pap with GC/CHL and HR HPV genotyping performed.  Uterus is felt to be normal size, shape, and contour.  No adnexal masses or tenderness noted.Bladder is non tender, no masses felt. Extremities/musculoskeletal:  No swelling or varicosities noted, no clubbing or cyanosis Psych:  No mood changes, alert and cooperative,seems happy AA is 1 Fall risk si low    07/28/2022   11:39 AM 02/05/2022    3:19 PM 01/07/2022    9:33 PM  Depression screen PHQ 2/9  Decreased Interest 1 0 0  Down,  Depressed, Hopeless 3 0 0  PHQ - 2 Score 4 0 0  Altered sleeping 3 0 1  Tired, decreased energy '3 3 3  '$ Change in appetite '2 3 2  '$ Feeling bad or failure about yourself  0 0 0  Trouble concentrating '2 2 3  '$ Moving slowly or fidgety/restless '2 2 3  '$ Suicidal thoughts 0 0 0  PHQ-9 Score '16 10 12  '$ Difficult doing work/chores  Somewhat difficult Somewhat difficult   She is on meds from PCP    07/28/2022   11:40 AM 02/05/2022    3:19 PM 01/07/2022    9:35 PM 06/05/2021    2:20 PM  GAD 7 : Generalized Anxiety Score  Nervous, Anxious, on Edge '2 2 3 2  '$ Control/stop worrying 2 0 0 3  Worry too much - different things '3 1 1 2  '$ Trouble relaxing '3 1 2 3  '$ Restless 0 0 2 3  Easily annoyed or irritable 0 0 1 2  Afraid - awful might happen 0 0 0 0  Total GAD 7 Score '10 4 9 15  '$ Anxiety Difficulty  Somewhat difficult Somewhat difficult Somewhat difficult    Upstream - 07/28/22 1149       Pregnancy Intention Screening   Does the patient want to become pregnant in the next year? Ok Either Way    Does the patient's partner want to become pregnant in the next year? Ok Either Way    Would the patient like to discuss contraceptive options  today? No      Contraception Wrap Up   Current Method Hormonal Implant    End Method Hormonal Implant              Examination chaperoned by Levy Pupa LPN   Impression and Plan: 1. Encounter for gynecological examination with Papanicolaou smear of cervix Pap sent Pap in 3 years if normal Physical in 1 year  2. Nexplanon in place Placed 08/26/20  3. Dyspareunia, female Try different positions  Call and let me know if zanaflex helped with pain   4. Abdominal wall pain in suprapubic region Try Zanaflex Meds ordered this encounter  Medications   tiZANidine (ZANAFLEX) 2 MG tablet    Sig: Take 1 tablet (2 mg total) by mouth every 6 (six) hours as needed for muscle spasms.    Dispense:  30 tablet    Refill:  0    Order Specific Question:   Supervising  Provider    Answer:   Tania Ade H [[id number]]

## 2022-07-31 LAB — CYTOLOGY - PAP
Chlamydia: NEGATIVE
Comment: NEGATIVE
Comment: NEGATIVE
Comment: NORMAL
Diagnosis: NEGATIVE
High risk HPV: NEGATIVE
Neisseria Gonorrhea: NEGATIVE

## 2022-08-11 ENCOUNTER — Other Ambulatory Visit: Payer: Self-pay | Admitting: Nurse Practitioner

## 2022-08-19 ENCOUNTER — Other Ambulatory Visit: Payer: Self-pay | Admitting: Nurse Practitioner

## 2022-08-20 ENCOUNTER — Other Ambulatory Visit: Payer: Self-pay

## 2022-08-20 ENCOUNTER — Encounter: Payer: Self-pay | Admitting: Nurse Practitioner

## 2022-08-20 MED ORDER — PHENTERMINE HCL 15 MG PO CAPS: ORAL_CAPSULE | ORAL | 0 refills | Status: DC

## 2022-08-20 MED ORDER — PHENTERMINE HCL 15 MG PO CAPS
ORAL_CAPSULE | ORAL | 0 refills | Status: DC
Start: 2022-08-20 — End: 2022-08-20

## 2022-09-21 ENCOUNTER — Ambulatory Visit (INDEPENDENT_AMBULATORY_CARE_PROVIDER_SITE_OTHER): Payer: 59 | Admitting: Nurse Practitioner

## 2022-09-21 ENCOUNTER — Encounter: Payer: Self-pay | Admitting: Nurse Practitioner

## 2022-09-21 VITALS — BP 124/68 | HR 97 | Temp 98.0°F | Resp 20 | Ht 62.5 in | Wt 199.0 lb

## 2022-09-21 DIAGNOSIS — G43919 Migraine, unspecified, intractable, without status migrainosus: Secondary | ICD-10-CM

## 2022-09-21 DIAGNOSIS — F418 Other specified anxiety disorders: Secondary | ICD-10-CM

## 2022-09-21 MED ORDER — NURTEC 75 MG PO TBDP: 75.0000 mg | ORAL_TABLET | ORAL | 2 refills | Status: DC

## 2022-09-21 NOTE — Progress Notes (Signed)
   Subjective:    Patient ID: Melissa Livingston, female    DOB: 07-16-1996, 26 y.o.   MRN: [id number]  HPI  26 year old female with history of obesity, anxiety with depression, and migraines presents to clinic for routine follow-up.  Patient states that her migraines have been better with sumatriptan and Topamax however patient reports increased tingling to her fingers and toes after we increased the dose of Topamax at last visit.  Patient also reports having increased dry mouth associated with Topamax.  Patient reports that depression and anxiety is better with increased dose of Lexapro.  Patient reports 22 pound weight loss while using phentermine.  Patient states she has 1 more bottle left.  Patient doing well with phentermine.  Patient has no other concerns.  Review of Systems  Constitutional:        Dry mouth and tingling of hands and feet  All other systems reviewed and are negative.      Objective:   Physical Exam Vitals reviewed.  Constitutional:      General: She is not in acute distress.    Appearance: Normal appearance. She is normal weight. She is not ill-appearing, toxic-appearing or diaphoretic.  HENT:     Head: Normocephalic and atraumatic.  Cardiovascular:     Rate and Rhythm: Normal rate and regular rhythm.     Pulses: Normal pulses.     Heart sounds: Normal heart sounds. No murmur heard. Pulmonary:     Effort: Pulmonary effort is normal. No respiratory distress.     Breath sounds: Normal breath sounds. No wheezing.  Musculoskeletal:     Comments: Grossly intact  Skin:    General: Skin is warm.     Capillary Refill: Capillary refill takes less than 2 seconds.  Neurological:     Mental Status: She is alert.     Comments: Grossly intact  Psychiatric:        Mood and Affect: Mood normal.        Behavior: Behavior normal.           Assessment & Plan:   1. Intractable migraine without status migrainosus, unspecified migraine type -Stop Topamax due to  side effects -Continue sumatriptan for abortive therapy -We will trial patient on Nurtec for preventative therapy -We will also refer patient to neurology for evaluation of headaches especially in the event of challenges with insurance coverage for Nurtec. - Rimegepant Sulfate (NURTEC) 75 MG TBDP; Take 75 mg by mouth every other day.  Dispense: 18 tablet; Refill: 2 - Ambulatory referral to Neurology  2. Morbid obesity ([place]) -Patient lost 22 pounds on phentermine. -Continue with current prescription -Continue with lifestyle changes once prescription is completed.  3. Depression with anxiety -Patient doing well on Lexapro 20 mg -Continue with prescription  Follow-up in 6 months with PCP or sooner if needed    Note:  This document was prepared using Dragon voice recognition software and may include unintentional dictation errors. Note - This record has been created using [employer].  Chart creation errors have been sought, but may not always  have been located. Such creation errors do not reflect on  the standard of medical care.

## 2022-10-02 ENCOUNTER — Telehealth: Payer: Self-pay | Admitting: *Deleted

## 2022-10-02 NOTE — Telephone Encounter (Signed)
Patient notfied

## 2022-10-02 NOTE — Telephone Encounter (Signed)
PA for Nurtec ODT 75 mg approved by insurance. Approval good 10/02/22- 04/02/23   Left message to return call to notify patient

## 2022-10-13 ENCOUNTER — Other Ambulatory Visit (HOSPITAL_COMMUNITY): Payer: Self-pay

## 2022-10-14 ENCOUNTER — Encounter: Payer: Self-pay | Admitting: Nurse Practitioner

## 2022-10-21 ENCOUNTER — Ambulatory Visit: Payer: 59 | Admitting: Neurology

## 2022-10-21 ENCOUNTER — Other Ambulatory Visit: Payer: Self-pay | Admitting: Nurse Practitioner

## 2022-10-21 MED ORDER — ONDANSETRON HCL 4 MG PO TABS: 4.0000 mg | ORAL_TABLET | Freq: Three times a day (TID) | ORAL | 1 refills | Status: DC | PRN

## 2022-11-19 ENCOUNTER — Encounter: Payer: Self-pay | Admitting: Orthopaedic Surgery

## 2022-11-19 ENCOUNTER — Ambulatory Visit (INDEPENDENT_AMBULATORY_CARE_PROVIDER_SITE_OTHER): Payer: 59

## 2022-11-19 ENCOUNTER — Ambulatory Visit: Payer: 59 | Admitting: Orthopaedic Surgery

## 2022-11-19 VITALS — BP 136/80 | HR 88 | Ht 62.0 in | Wt 194.0 lb

## 2022-11-19 DIAGNOSIS — G8929 Other chronic pain: Secondary | ICD-10-CM

## 2022-11-19 DIAGNOSIS — M25562 Pain in left knee: Secondary | ICD-10-CM

## 2022-11-19 MED ORDER — METHYLPREDNISOLONE ACETATE 40 MG/ML IJ SUSP
40.0000 mg | Freq: Once | INTRAMUSCULAR | Status: AC
  Administered 2022-11-19: 40 mg via INTRA_ARTICULAR

## 2022-11-19 NOTE — Patient Instructions (Addendum)
Medication Instructions:  Take voltaren as directed  Labwork: N/A  Testing/Procedures: N/A  Follow-Up: 1 month  Any Other Special Instructions Will Be Listed Below (If Applicable). Ice your knee several times today and take it easy.         If you need a refill on your  medications prescribed by Dr. Luna Glasgow before your next appointment, please call your pharmacy.

## 2022-11-19 NOTE — Addendum Note (Signed)
Addended by: Obie Dredge A on: 11/19/2022 08:52 AM   Modules accepted: Orders

## 2022-11-19 NOTE — Progress Notes (Signed)
My knee is hurting.  She has had pain in the left knee for several months.  She is doing a lot of bending and squatting at work now.  She has no trauma, no redness.  She has some swelling and popping but no giving way.  She has no numbness.  She has tried ice, heat, rubs and Advil with minimal help. She has Voltaren 75 but does not take it.  Left knee has ROM 0 to 110, stable, sight effusion, slight crepitus, good gait, NV intact, no distal edema.  X-rays were done of the left knee, reported separately.  Encounter Diagnosis  Name Primary?   Chronic pain of left knee Yes   PROCEDURE NOTE:  The patient requests injections of the left knee , verbal consent was obtained.  The left knee was prepped appropriately after time out was performed.   Sterile technique was observed and injection of 1 cc of DepoMedrol 40 mg with several cc's of plain xylocaine. Anesthesia was provided by ethyl chloride and a 20-gauge needle was used to inject the knee area. The injection was tolerated well.  A band aid dressing was applied.  The patient was advised to apply ice later today and tomorrow to the injection sight as needed.  Resume the diclofenac   Return in one month.  Call if any problem.  Precautions discussed.  Electronically Signed Sanjuana Kava, MD 12/14/20238:20 AM

## 2022-11-23 ENCOUNTER — Encounter: Payer: Self-pay | Admitting: Neurology

## 2022-11-23 ENCOUNTER — Ambulatory Visit: Payer: 59 | Admitting: Neurology

## 2022-11-23 VITALS — BP 121/77 | HR 91 | Ht 62.0 in | Wt 195.6 lb

## 2022-11-23 DIAGNOSIS — R51 Headache with orthostatic component, not elsewhere classified: Secondary | ICD-10-CM | POA: Diagnosis not present

## 2022-11-23 DIAGNOSIS — G4484 Primary exertional headache: Secondary | ICD-10-CM | POA: Diagnosis not present

## 2022-11-23 DIAGNOSIS — H539 Unspecified visual disturbance: Secondary | ICD-10-CM | POA: Diagnosis not present

## 2022-11-23 DIAGNOSIS — G43709 Chronic migraine without aura, not intractable, without status migrainosus: Secondary | ICD-10-CM | POA: Diagnosis not present

## 2022-11-23 DIAGNOSIS — R519 Headache, unspecified: Secondary | ICD-10-CM

## 2022-11-23 MED ORDER — RIZATRIPTAN BENZOATE 10 MG PO TBDP: 10.0000 mg | ORAL_TABLET | ORAL | 11 refills | Status: DC | PRN

## 2022-11-23 MED ORDER — AJOVY 225 MG/1.5ML ~~LOC~~ SOAJ: 225.0000 mg | SUBCUTANEOUS | 11 refills | Status: DC

## 2022-11-23 MED ORDER — ONDANSETRON 4 MG PO TBDP
4.0000 mg | ORAL_TABLET | Freq: Three times a day (TID) | ORAL | 3 refills | Status: AC | PRN
  Filled 2023-03-22 – 2023-04-16 (×2): qty 30, 5d supply, fill #0

## 2022-11-23 NOTE — Progress Notes (Signed)
[facility]    Provider:  Dr Jaynee Eagles Requesting Provider: Ameduite, Trenton Gammon, FNP Primary Care Provider:  Kathyrn Drown, MD  CC:  migraines  HPI:  Melissa Livingston is a 26 y.o. female here as requested by Ameduite, Trenton Gammon, FNP for migraines. PMHx HTN, asthma, IBS,gerd, left sciatica, weight gain, obesity, headache, constipation, anxiety with depression. She started with migraines since the age of 35. They severe enough she can;t get out of work. They start in the back of front, pulsating/pounding.throbbing, in the temples, when she laughs a lof the back of the head will hurt, occ she gets blurry, hurts in there neck with valsalva or pushing down, morning headaches and nocturnal headaches worse supine. Pulsating/pounding/throbbing, nausa, light sensitivity, daull headache almost every day and >14 migraine days a month, she had side effects to the topiramate couldn;t tolerate a higher dose, they can be moreate to severe and on average lasts 48 hours, she has a lot neck pain and radiates into the neck. Nothing makes it better. Trggers are bright lights. Cetain smells can triggers. For over year. No aura. No medication overuse, No other focal neurologic deficits, associated symptoms, inciting events or modifiable factors.   Reviewed notes, labs and imaging from outside physicians, which showed:   01/09/2019: CT cervical spine  EXAM: CT CERVICAL SPINE WITHOUT CONTRAST   TECHNIQUE: Multidetector CT imaging of the cervical spine was performed without intravenous contrast. Multiplanar CT image reconstructions were also generated.   COMPARISON:  None.   FINDINGS: Alignment: There is reversal of the usual cervical lordosis. This is nonspecific and can be due to patient positioning but ligamentous injury or muscle spasm could also have this appearance and are not excluded. MRI is more sensitive for evaluation of ligamentous injury if clinically indicated. No anterior subluxation.  Normal alignment of the facet joints. C1-2 articulation appears intact.   Skull base and vertebrae: No vertebral compression deformities. No focal bone lesion or bone destruction. Bone cortex appears intact.   Soft tissues and spinal canal: No paraspinal soft tissue mass or infiltration. No prevertebral soft tissue swelling.   Disc levels:  Intervertebral disc space heights are preserved.   Upper chest: Lung apices are clear.   Other: None.   IMPRESSION: Nonspecific reversal of the usual cervical lordosis. This could be positional or may indicate ligamentous injury. MRI would be more sensitive for evaluation of ligamentous injury if clinically indicated. No acute displaced fractures identified.  From a thorough review of records, meds treid that can be used in migraine managemen tinclude: tylenol, aspirin, fioticet, celexa, flexeril, propranolol contraindicated due to asthma, voltaren, benadryl, lexapro, gabapentin, labetalol, ibuprofen, ketorolac, magnesium, mobic, solumedrol, reglan, naproxen, nortriptyline, zofran, oxy, phenergen, imitrex, tizanidine, topamax  Review of Systems: Patient complains of symptoms per HPI as well as the following symptoms migraines. Pertinent negatives and positives per HPI. All others negative.   Social History   Socioeconomic History   Marital status: Significant Other    Spouse name: Not on file   Number of children: 2   Years of education: Not on file   Highest education level: Some college, no degree  Occupational History   Occupation: out due to knee  Tobacco Use   Smoking status: Never   Smokeless tobacco: Never  Vaping Use   Vaping Use: Never used  Substance and Sexual Activity   Alcohol use: No    Alcohol/week: 0.0 standard drinks of alcohol   Drug use: No   Sexual activity: Yes  Birth control/protection: Implant  Other Topics Concern   Not on file  Social History Narrative   Not on file   Social Determinants of Health    Financial Resource Strain: Medium Risk (07/28/2022)   Overall Financial Resource Strain (CARDIA)    Difficulty of Paying Living Expenses: Somewhat hard  Food Insecurity: No Food Insecurity (07/28/2022)   Hunger Vital Sign    Worried About Running Out of Food in the Last Year: Never true    Ran Out of Food in the Last Year: Never true  Transportation Needs: No Transportation Needs (07/28/2022)   PRAPARE - Hydrologist (Medical): No    Lack of Transportation (Non-Medical): No  Physical Activity: Insufficiently Active (07/28/2022)   Exercise Vital Sign    Days of Exercise per Week: 3 days    Minutes of Exercise per Session: 40 min  Stress: Stress Concern Present (07/28/2022)   [place]    Feeling of Stress : Very much  Social Connections: Moderately Isolated (07/28/2022)   Social Connection and Isolation Panel [NHANES]    Frequency of Communication with Friends and Family: More than three times a week    Frequency of Social Gatherings with Friends and Family: More than three times a week    Attends Religious Services: More than 4 times per year    Active Member of Clubs or Organizations: No    Attends Archivist Meetings: Never    Marital Status: Never married  Intimate Partner Violence: Not At Risk (07/28/2022)   Humiliation, Afraid, Rape, and Kick questionnaire    Fear of Current or Ex-Partner: No    Emotionally Abused: No    Physically Abused: No    Sexually Abused: No    Family History  Problem Relation Age of Onset   Multiple sclerosis Mother    Migraines Mother    Hypertension Father    Hypertension Paternal Aunt    Heart disease Paternal Uncle    Diabetes Paternal Uncle    Hypertension Paternal Uncle    Heart disease Maternal Grandfather    Cancer Other        father's aunt had breast cancer   Colon cancer Other        maternal great uncle, age greater than 56    Crohn's disease Other        couple of family members on father's side of family    Past Medical History:  Diagnosis Date   Anemia    Anxiety    Asthma    as child   Endometritis 01/11/2015   GERD (gastroesophageal reflux disease)    Headache    Hematochezia 05/08/2015   History of ovarian cyst 01/28/2015   IBS (irritable bowel syndrome)    Infection    UTI   Nausea and vomiting 01/11/2015   Ovarian cyst 01/14/2015   Pelvic pain in female 01/11/2015   Pregnancy induced hypertension    with pregnancy   Pregnant 02/05/2016   Rectal bleeding 07/18/2013    Patient Active Problem List   Diagnosis Date Noted   Chronic migraine without aura without status migrainosus, not intractable 11/23/2022   Abdominal wall pain in suprapubic region 07/28/2022   Dyspareunia, female 07/28/2022   Encounter for gynecological examination with Papanicolaou smear of cervix 07/28/2022   Sleep disturbance 06/05/2021   Nexplanon in place 01/22/2021   Weight gain 01/22/2021   Frequent headaches 01/22/2021   Pregnancy examination or  test, negative result 01/22/2021   Swelling of both ankles 01/22/2021   Short of breath on exertion 01/22/2021   Nexplanon insertion 08/26/2020   Sepsis ([place]) 07/25/2020   Pneumonia due to COVID-19 virus 07/25/2020   Chronic hypertension 03/21/2020   Headache disorder 01/01/2019   Chronic pain of both knees 08/25/2018   Constipation 08/25/2018   Acute left-sided low back pain with left-sided sciatica 01/09/2017   Morbid obesity ([place]) 01/09/2017   S/P cesarean section 03/04/2016   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   GERD (gastroesophageal reflux disease) 07/18/2013   Irritable bowel syndrome 07/03/2013   Depression with anxiety 07/03/2013   Asthma 03/27/2013    Past Surgical History:  Procedure Laterality Date   CESAREAN SECTION N/A 09/29/2014   Procedure: CESAREAN SECTION;  Surgeon: Jonnie Kind, MD;  Location: [facility];  Service: Obstetrics;  Laterality:  N/A;   CESAREAN SECTION N/A 09/25/2016   Procedure: CESAREAN SECTION;  Surgeon: Jonnie Kind, MD;  Location: [facility];  Service: Obstetrics;  Laterality: N/A;   CESAREAN SECTION N/A 08/25/2020   Procedure: CESAREAN SECTION;  Surgeon: Florian Buff, MD;  Location: [facility];  Service: Obstetrics;  Laterality: N/A;   CHOLECYSTECTOMY N/A 03/07/2018   Procedure: LAPAROSCOPIC CHOLECYSTECTOMY;  Surgeon: Aviva Signs, MD;  Location: [facility];  Service: General;  Laterality: N/A;   COLONOSCOPY WITH PROPOFOL N/A 08/02/2017   Dr. Gala Romney: moderate internal hemorhoids, distal 5 cm of TI also appeared normal   ESOPHAGOGASTRODUODENOSCOPY (EGD) WITH ESOPHAGEAL DILATION N/A 07/19/2013   Dr. Vivi Ferns appearing esophagus s/p dilation, normal D1 and D2, unremarkable path    ESOPHAGOGASTRODUODENOSCOPY (EGD) WITH PROPOFOL N/A 08/02/2017   Normal esophagus, small hiatal hernia, normal duodenum   none     WISDOM TOOTH EXTRACTION      Current Outpatient Medications  Medication Sig Dispense Refill   acetaminophen (TYLENOL) 500 MG tablet Take 2 tablets (1,000 mg total) by mouth every 6 (six) hours as needed for mild pain, moderate pain or headache. 60 tablet 0   albuterol (VENTOLIN HFA) 108 (90 Base) MCG/ACT inhaler Inhale 1-2 puffs into the lungs every 6 (six) hours as needed for wheezing or shortness of breath. 18 g 0   cetirizine (ZYRTEC ALLERGY) 10 MG tablet Take 1 tablet (10 mg total) by mouth daily. 30 tablet 0   diclofenac (VOLTAREN) 75 MG EC tablet Take 1 tablet (75 mg total) by mouth 2 (two) times daily. 30 tablet 0   escitalopram (LEXAPRO) 20 MG tablet Take 1 tablet (20 mg total) by mouth daily. 30 tablet 3   etonogestrel (NEXPLANON) 68 MG IMPL implant 1 each by Subdermal route once.     ferrous sulfate 325 (65 FE) MG tablet Take one tablet po once daily 60 tablet 3   Fremanezumab-vfrm (AJOVY) 225 MG/1.5ML SOAJ Inject 225 mg into the skin every 30 (thirty) days. 1.5 mL 11   ondansetron (ZOFRAN)  4 MG tablet Take 1 tablet (4 mg total) by mouth every 8 (eight) hours as needed for nausea or vomiting. 20 tablet 1   ondansetron (ZOFRAN-ODT) 4 MG disintegrating tablet Take 1-2 tablets (4-8 mg total) by mouth every 8 (eight) hours as needed. 30 tablet 3   pantoprazole (PROTONIX) 40 MG tablet Take 1 tablet (40 mg total) by mouth daily. 30 tablet 1   phentermine 15 MG capsule TAKE ONE CAPSULE ('15MG'$  TOTAL) BY MOUTH EVERY MORNING 60 capsule 0   Rimegepant Sulfate (NURTEC) 75 MG TBDP Take 75 mg by mouth every  other day. 18 tablet 2   rizatriptan (MAXALT-MLT) 10 MG disintegrating tablet Take 1 tablet (10 mg total) by mouth as needed for migraine. May repeat in 2 hours if needed 9 tablet 11   SUMAtriptan (IMITREX) 50 MG tablet Take 1 tablet (50 mg total) by mouth 4 (four) times daily as needed for migraine (Do not exceed '200mg'$  a day). May repeat in 2 hours if headache persists or recurs. 10 tablet 3   tiZANidine (ZANAFLEX) 2 MG tablet Take 1 tablet (2 mg total) by mouth every 6 (six) hours as needed for muscle spasms. 30 tablet 0   traZODone (DESYREL) 50 MG tablet Take 0.5-1 tablets (25-50 mg total) by mouth at bedtime as needed for sleep. 30 tablet 4   No current facility-administered medications for this visit.    Allergies as of 11/23/2022 - Review Complete 11/23/2022  Allergen Reaction Noted   Adhesive [tape] Other (See Comments) 06/14/2015   Lorabid [loracarbef] Hives, Swelling, and Other (See Comments) 03/28/2013   Latex Itching and Rash 07/18/2013   Orange fruit [citrus] Rash 07/18/2013    Vitals: BP 121/77   Pulse 91   Ht '5\' 2"'$  (1.575 m)   Wt 195 lb 9.6 oz (88.7 kg)   BMI 35.78 kg/m  Last Weight:  Wt Readings from Last 1 Encounters:  11/23/22 195 lb 9.6 oz (88.7 kg)   Last Height:   Ht Readings from Last 1 Encounters:  11/23/22 '5\' 2"'$  (1.575 m)     Physical exam: Exam: Gen: NAD, conversant, well nourised, obese, well groomed                     CV: RRR, no MRG. No Carotid  Bruits. No peripheral edema, warm, nontender Eyes: Conjunctivae clear without exudates or hemorrhage  Neuro: Detailed Neurologic Exam  Speech:    Speech is normal; fluent and spontaneous with normal comprehension.  Cognition:    The patient is oriented to person, place, and time;     recent and remote memory intact;     language fluent;     normal attention, concentration,     fund of knowledge Cranial Nerves:    The pupils are equal, round, and reactive to light. The fundi are normal and spontaneous venous pulsations are present. Visual fields are full to finger confrontation. Extraocular movements are intact. Trigeminal sensation is intact and the muscles of mastication are normal. The face is symmetric. The palate elevates in the midline. Hearing intact. Voice is normal. Shoulder shrug is normal. The tongue has normal motion without fasciculations.   Coordination: nml  Gait: nml  Motor Observation:    No asymmetry, no atrophy, and no involuntary movements noted. Tone:    Normal muscle tone.    Posture:    Posture is normal. normal erect    Strength:    Strength is V/V in the upper and lower limbs.      Sensation: intact to LT     Reflex Exam:  DTR's:    Deep tendon reflexes in the upper and lower extremities are normal bilaterally.   Toes:    The toes are downgoing bilaterally.   Clonus:    Clonus is absent.    Assessment/Plan:  Patient with chronic migraines  Try one of the new preventatibe medications; Ajovy or Emgality. For prevention. Don;t et pregnant for 6 months until after stopping. Others in clude qulipta, nurtec. Acute/as needed: Rizatriptan and Ondansetron. Take them toether at onset, then can repeating in  2 hours RIGHT at onset. Other new ones include ubrelvy and nurtec.  MRI of the brain w.wo contrast: MRI brain due to concerning symptoms of morning headaches, positional headaches,headaches with laughing or valsalva(chairi?)vision changes, worsening  headaches  to look for space occupying mass, chiari or intracranial hypertension (pseudotumor), strokes, malignancies, vasculidities, demyelination(multiple sclerosis) or other   Orders Placed This Encounter  Procedures   MR BRAIN W WO CONTRAST   CBC with Differential/Platelets   Comprehensive metabolic panel   TSH Rfx on Abnormal to Free T4   Meds ordered this encounter  Medications   ondansetron (ZOFRAN-ODT) 4 MG disintegrating tablet    Sig: Take 1-2 tablets (4-8 mg total) by mouth every 8 (eight) hours as needed.    Dispense:  30 tablet    Refill:  3   Fremanezumab-vfrm (AJOVY) 225 MG/1.5ML SOAJ    Sig: Inject 225 mg into the skin every 30 (thirty) days.    Dispense:  1.5 mL    Refill:  11   rizatriptan (MAXALT-MLT) 10 MG disintegrating tablet    Sig: Take 1 tablet (10 mg total) by mouth as needed for migraine. May repeat in 2 hours if needed    Dispense:  9 tablet    Refill:  11    Cc: Ameduite, Trenton Gammon, FNP,  [place], Elayne Snare, MD  Sarina Ill, MD  [facility] [address] [place], [place] [address]  Phone [phone] Fax [phone]

## 2022-11-23 NOTE — Patient Instructions (Signed)
Try one of the new preventatibe medications; Ajovy or Emgality. For prevention. Don;t et pregnant for 6 months until after stopping. Others in clude qulipta, nurtec. Acute/as needed: Rizatriptan and Ondansetron. Take them toether at onset, then can repeating in 2 hours RIGHT at onset. Other new ones include ubrelvy and nurtec.  MRI of the brain w.wo contrast   Fremanezumab Injection What is this medication? FREMANEZUMAB (fre ma NEZ ue mab) prevents migraines. It works by blocking a substance in the body that causes migraines. It is a monoclonal antibody. This medicine may be used for other purposes; ask your health care provider or pharmacist if you have questions. COMMON BRAND NAME(S): AJOVY What should I tell my care team before I take this medication? They need to know if you have any of these conditions: An unusual or allergic reaction to fremanezumab, other medications, foods, dyes, or preservatives Pregnant or trying to get pregnant Breast-feeding How should I use this medication? This medication is injected under the skin. You will be taught how to prepare and give it. Take it as directed on the prescription label. Keep taking it unless your care team tells you to stop. It is important that you put your used needles and syringes in a special sharps container. Do not put them in a trash can. If you do not have a sharps container, call your pharmacist or care team to get one. Talk to your care team about the use of this medication in children. Special care may be needed. Overdosage: If you think you have taken too much of this medicine contact a poison control center or emergency room at once. NOTE: This medicine is only for you. Do not share this medicine with others. What if I miss a dose? If you miss a dose, take it as soon as you can. If it is almost time for your next dose, take only that dose. Do not take double or extra doses. What may interact with this medication? Interactions  are not expected. This list may not describe all possible interactions. Give your health care provider a list of all the medicines, herbs, non-prescription drugs, or dietary supplements you use. Also tell them if you smoke, drink alcohol, or use illegal drugs. Some items may interact with your medicine. What should I watch for while using this medication? Tell your care team if your symptoms do not start to get better or if they get worse. What side effects may I notice from receiving this medication? Side effects that you should report to your care team as soon as possible: Allergic reactions or angioedema--skin rash, itching or hives, swelling of the face, eyes, lips, tongue, arms, or legs, trouble swallowing or breathing Side effects that usually do not require medical attention (report to your care team if they continue or are bothersome): Pain, redness, or irritation at injection site This list may not describe all possible side effects. Call your doctor for medical advice about side effects. You may report side effects to FDA at 1-800-FDA-1088. Where should I keep my medication? Keep out of the reach of children and pets. Store in a refrigerator or at room temperature between 20 and 25 degrees C (68 and 77 degrees F). Refrigeration (preferred): Store in the refrigerator. Do not freeze. Keep in the original container until you are ready to take it. Remove the dose from the carton about 30 minutes before it is time for you to use it. If the dose is not used, it may be stored  in the original container at room temperature for 7 days. Get rid of any unused medication after the expiration date. Room Temperature: This medication may be stored at room temperature for up to 7 days. Keep it in the original container. Protect from light until time of use. If it is stored at room temperature, get rid of any unused medication after 7 days or after it expires, whichever is first. To get rid of medications  that are no longer needed or have expired: Take the medication to a medication take-back program. Check with your pharmacy or law enforcement to find a location. If you cannot return the medication, ask your pharmacist or care team how to get rid of this medication safely. NOTE: This sheet is a summary. It may not cover all possible information. If you have questions about this medicine, talk to your doctor, pharmacist, or health care provider.  2023 Elsevier/Gold Standard (2017-08-24 00:00:00) Ondansetron Tablets What is this medication? ONDANSETRON (on DAN se tron) prevents nausea and vomiting from chemotherapy, radiation, or surgery. It works by blocking substances in the body that may cause nausea or vomiting. It belongs to a group of medications called antiemetics. This medicine may be used for other purposes; ask your health care provider or pharmacist if you have questions. COMMON BRAND NAME(S): Zofran What should I tell my care team before I take this medication? They need to know if you have any of these conditions: Heart disease History of irregular heartbeat Liver disease Low levels of magnesium or potassium in the blood An unusual or allergic reaction to ondansetron, granisetron, other medications, foods, dyes, or preservatives Pregnant or trying to get pregnant Breast-feeding How should I use this medication? Take this medication by mouth with a glass of water. Follow the directions on your prescription label. Take your doses at regular intervals. Do not take your medication more often than directed. Talk to your care team regarding the use of this medication in children. Special care may be needed. Overdosage: If you think you have taken too much of this medicine contact a poison control center or emergency room at once. NOTE: This medicine is only for you. Do not share this medicine with others. What if I miss a dose? If you miss a dose, take it as soon as you can. If it is  almost time for your next dose, take only that dose. Do not take double or extra doses. What may interact with this medication? Do not take this medication with any of the following: Apomorphine Certain medications for fungal infections like fluconazole, itraconazole, ketoconazole, posaconazole, voriconazole Cisapride Dronedarone Pimozide Thioridazine This medication may also interact with the following: Carbamazepine Certain medications for depression, anxiety, or psychotic disturbances Fentanyl Linezolid MAOIs like Carbex, Eldepryl, Marplan, Nardil, and Parnate Methylene blue (injected into a vein) Other medications that prolong the QT interval (cause an abnormal heart rhythm) like dofetilide, ziprasidone Phenytoin Rifampicin Tramadol This list may not describe all possible interactions. Give your health care provider a list of all the medicines, herbs, non-prescription drugs, or dietary supplements you use. Also tell them if you smoke, drink alcohol, or use illegal drugs. Some items may interact with your medicine. What should I watch for while using this medication? Check with your care team right away if you have any sign of an allergic reaction. What side effects may I notice from receiving this medication? Side effects that you should report to your care team as soon as possible: Allergic reactions--skin rash, itching, hives,  swelling of the face, lips, tongue, or throat Bowel blockage--stomach cramping, unable to have a bowel movement or pass gas, loss of appetite, vomiting Chest pain (angina)--pain, pressure, or tightness in the chest, neck, back, or arms Heart rhythm changes--fast or irregular heartbeat, dizziness, feeling faint or lightheaded, chest pain, trouble breathing Irritability, confusion, fast or irregular heartbeat, muscle stiffness, twitching muscles, sweating, high fever, seizure, chills, vomiting, diarrhea, which may be signs of serotonin syndrome Side effects  that usually do not require medical attention (report to your care team if they continue or are bothersome): Constipation Diarrhea General discomfort and fatigue Headache This list may not describe all possible side effects. Call your doctor for medical advice about side effects. You may report side effects to FDA at 1-800-FDA-1088. Where should I keep my medication? Keep out of the reach of children and pets. Store between 2 and 30 degrees C (36 and 86 degrees F). Throw away any unused medication after the expiration date. NOTE: This sheet is a summary. It may not cover all possible information. If you have questions about this medicine, talk to your doctor, pharmacist, or health care provider.  2023 Elsevier/Gold Standard (2020-12-27 00:00:00) Rizatriptan Disintegrating Tablets What is this medication? RIZATRIPTAN (rye za TRIP tan) treats migraines. It works by blocking pain signals and narrowing blood vessels in the brain. It belongs to a group of medications called triptans. It is not used to prevent migraines. This medicine may be used for other purposes; ask your health care provider or pharmacist if you have questions. COMMON BRAND NAME(S): Maxalt-MLT What should I tell my care team before I take this medication? They need to know if you have any of these conditions: Circulation problems in fingers and toes Diabetes Heart disease High blood pressure High cholesterol History of irregular heartbeat History of stroke Stomach or intestine problems Tobacco use An unusual or allergic reaction to rizatriptan, other medications, foods, dyes, or preservatives Pregnant or trying to get pregnant Breast-feeding How should I use this medication? Take this medication by mouth. Take it as directed on the prescription label. You do not need water to take this medication. Leave the tablet in the sealed pack until you are ready to take it. With dry hands, open the pack and gently remove the  tablet. If the tablet breaks or crumbles, throw it away. Use a new tablet. Place the tablet on the tongue and allow it to dissolve. Then, swallow it. Do not cut, crush, or chew this medication. Do not use it more often than directed. Talk to your care team about the use of this medication in children. While it may be prescribed for children as young as 6 years for selected conditions, precautions do apply. Overdosage: If you think you have taken too much of this medicine contact a poison control center or emergency room at once. NOTE: This medicine is only for you. Do not share this medicine with others. What if I miss a dose? This does not apply. This medication is not for regular use. What may interact with this medication? Do not take this medication with any of the following: Ergot alkaloids, such as dihydroergotamine, ergotamine MAOIs, such as Marplan, Nardil, Parnate Other medications for migraine headache, such as almotriptan, eletriptan, frovatriptan, naratriptan, sumatriptan, zolmitriptan This medication may also interact with the following: Certain medications for depression, anxiety, or other mental health conditions Propranolol This list may not describe all possible interactions. Give your health care provider a list of all the medicines, herbs, non-prescription  drugs, or dietary supplements you use. Also tell them if you smoke, drink alcohol, or use illegal drugs. Some items may interact with your medicine. What should I watch for while using this medication? Visit your care team for regular checks on your progress. Tell your care team if your symptoms do not start to get better or if they get worse. This medication may affect your coordination, reaction time, or judgment. Do not drive or operate machinery until you know how this medication affects you. Sit up or stand slowly to reduce the risk of dizzy or fainting spells. If you take migraine medications for 10 or more days a month,  your migraines may get worse. Keep a diary of headache days and medication use. Contact your care team if your migraine attacks occur more frequently. What side effects may I notice from receiving this medication? Side effects that you should report to your care team as soon as possible: Allergic reactions--skin rash, itching, hives, swelling of the face, lips, tongue, or throat Burning, pain, tingling, or color changes in the hands, arms, legs, or feet Heart attack--pain or tightness in the chest, shoulders, arms, or jaw, nausea, shortness of breath, cold or clammy skin, feeling faint or lightheaded Heart rhythm changes--fast or irregular heartbeat, dizziness, feeling faint or lightheaded, chest pain, trouble breathing Increase in blood pressure Irritability, confusion, fast or irregular heartbeat, muscle stiffness, twitching muscles, sweating, high fever, seizure, chills, vomiting, diarrhea, which may be signs of serotonin syndrome Raynaud syndrome--cool, numb, or painful fingers or toes that may change color from pale, to blue, to red Seizures Stroke--sudden numbness or weakness of the face, arm, or leg, trouble speaking, confusion, trouble walking, loss of balance or coordination, dizziness, severe headache, change in vision Sudden or severe stomach pain, bloody diarrhea, fever, nausea, vomiting Vision loss Side effects that usually do not require medical attention (report to your care team if they continue or are bothersome): Dizziness Unusual weakness or fatigue This list may not describe all possible side effects. Call your doctor for medical advice about side effects. You may report side effects to FDA at 1-800-FDA-1088. Where should I keep my medication? Keep out of the reach of children and pets. Store at room temperature between 15 and 30 degrees C (59 and 86 degrees F). Protect from light and moisture. Get rid of any unused medication after the expiration date. To get rid of  medications that are no longer needed or have expired: Take the medication to a medication take-back program. Check with your pharmacy or law enforcement to find a location. If you cannot return the medication, check the label or package insert to see if the medication should be thrown out in the garbage or flushed down the toilet. If you are not sure, ask your care team. If it is safe to put it in the trash, empty the medication out of the container. Mix the medication with cat litter, dirt, coffee grounds, or other unwanted substance. Seal the mixture in a bag or container. Put it in the trash. NOTE: This sheet is a summary. It may not cover all possible information. If you have questions about this medicine, talk to your doctor, pharmacist, or health care provider.  2023 Elsevier/Gold Standard (2022-03-26 00:00:00)

## 2022-11-24 ENCOUNTER — Telehealth: Payer: Self-pay | Admitting: Neurology

## 2022-11-24 ENCOUNTER — Telehealth: Payer: Self-pay | Admitting: *Deleted

## 2022-11-24 LAB — CBC WITH DIFFERENTIAL/PLATELET
Basophils Absolute: 0 10*3/uL (ref 0.0–0.2)
Basos: 0 %
EOS (ABSOLUTE): 0.1 10*3/uL (ref 0.0–0.4)
Eos: 1 %
Hematocrit: 36.1 % (ref 34.0–46.6)
Hemoglobin: 11.5 g/dL (ref 11.1–15.9)
Immature Grans (Abs): 0 10*3/uL (ref 0.0–0.1)
Immature Granulocytes: 0 %
Lymphocytes Absolute: 2 10*3/uL (ref 0.7–3.1)
Lymphs: 26 %
MCH: 24.5 pg — ABNORMAL LOW (ref 26.6–33.0)
MCHC: 31.9 g/dL (ref 31.5–35.7)
MCV: 77 fL — ABNORMAL LOW (ref 79–97)
Monocytes Absolute: 0.6 10*3/uL (ref 0.1–0.9)
Monocytes: 7 %
Neutrophils Absolute: 5 10*3/uL (ref 1.4–7.0)
Neutrophils: 66 %
Platelets: 389 10*3/uL (ref 150–450)
RBC: 4.7 x10E6/uL (ref 3.77–5.28)
RDW: 14 % (ref 11.7–15.4)
WBC: 7.7 10*3/uL (ref 3.4–10.8)

## 2022-11-24 LAB — COMPREHENSIVE METABOLIC PANEL
ALT: 6 IU/L (ref 0–32)
AST: 6 IU/L (ref 0–40)
Albumin/Globulin Ratio: 1.6 (ref 1.2–2.2)
Albumin: 4.1 g/dL (ref 4.0–5.0)
Alkaline Phosphatase: 87 IU/L (ref 44–121)
BUN/Creatinine Ratio: 11 (ref 9–23)
BUN: 9 mg/dL (ref 6–20)
Bilirubin Total: 0.5 mg/dL (ref 0.0–1.2)
CO2: 24 mmol/L (ref 20–29)
Calcium: 9.1 mg/dL (ref 8.7–10.2)
Chloride: 103 mmol/L (ref 96–106)
Creatinine, Ser: 0.83 mg/dL (ref 0.57–1.00)
Globulin, Total: 2.6 g/dL (ref 1.5–4.5)
Glucose: 84 mg/dL (ref 70–99)
Potassium: 4.5 mmol/L (ref 3.5–5.2)
Sodium: 139 mmol/L (ref 134–144)
Total Protein: 6.7 g/dL (ref 6.0–8.5)
eGFR: 98 mL/min/{1.73_m2} (ref >59)

## 2022-11-24 LAB — TSH RFX ON ABNORMAL TO FREE T4: TSH: 1.33 u[IU]/mL (ref 0.450–4.500)

## 2022-11-24 NOTE — Telephone Encounter (Signed)
Melissa Livingston Key: [id number] - PA Case ID: [id number] - Rx #: [id number]   PA Ajovy complete waiting on approval

## 2022-11-24 NOTE — Telephone Encounter (Signed)
[facility] NPR sent to [facility] per patient request [phone]

## 2022-11-28 ENCOUNTER — Ambulatory Visit
Admission: RE | Admit: 2022-11-28 | Discharge: 2022-11-28 | Disposition: A | Discharge status: Home / Self Care | Payer: 59 | Source: Ambulatory Visit | Attending: Neurology | Admitting: Neurology

## 2022-11-28 DIAGNOSIS — R51 Headache with orthostatic component, not elsewhere classified: Secondary | ICD-10-CM

## 2022-11-28 DIAGNOSIS — H539 Unspecified visual disturbance: Secondary | ICD-10-CM

## 2022-11-28 DIAGNOSIS — G4484 Primary exertional headache: Secondary | ICD-10-CM | POA: Diagnosis not present

## 2022-11-28 DIAGNOSIS — R519 Headache, unspecified: Secondary | ICD-10-CM | POA: Diagnosis not present

## 2022-11-28 MED ORDER — GADOBUTROL 1 MMOL/ML IV SOLN
8.0000 mL | Freq: Once | INTRAVENOUS | Status: AC | PRN
  Administered 2022-11-28: 10 mL via INTRAVENOUS

## 2022-12-02 ENCOUNTER — Telehealth: Payer: Self-pay | Admitting: Neurology

## 2022-12-02 NOTE — Telephone Encounter (Signed)
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 Could have been there for years. Have her take a baby aspirin and follow up with me to discuss first avail not urgent. Could really be nothing but we should talk about it and work it [employer] phone note for my team to call next week

## 2022-12-02 NOTE — Telephone Encounter (Signed)
Please contact pt to get scheduled with Dr Jaynee Eagles  first avail, not urgent.

## 2022-12-09 MED ORDER — EMGALITY 120 MG/ML ~~LOC~~ SOAJ
1.0000 | SUBCUTANEOUS | 11 refills | Status: DC
  Filled 2023-02-03: qty 1, 30d supply, fill #0
  Filled 2023-03-22 – 2023-04-16 (×2): qty 1, 30d supply, fill #1
  Filled 2023-06-22: qty 1, 30d supply, fill #2

## 2022-12-09 NOTE — Telephone Encounter (Signed)
Received a fax from [place] stating [facility] has been denied due to insurance requirement that patient try at least 2 formulary alternatives (Aimovig, Emgality, Nurtec ODT, or Qulipta unless there are contraindications. If appeal desired, fax to [phone]. PA reference # [id number].   Per office note, Dr Jaynee Eagles had suggested Ajovy or Emgality to try. Ajovy was ordered first, but since insurance prefers Baker, will see if work-in provider will authorize prescription for Emgality.

## 2022-12-09 NOTE — Addendum Note (Signed)
Addended by: Brandon Melnick on: 12/09/2022 04:53 PM   Modules accepted: Orders

## 2022-12-09 NOTE — Telephone Encounter (Signed)
Ok to switch to [employer].

## 2022-12-11 ENCOUNTER — Encounter: Payer: Self-pay | Admitting: Neurology

## 2022-12-16 ENCOUNTER — Emergency Department (HOSPITAL_COMMUNITY)
Admission: EM | Admit: 2022-12-16 | Discharge: 2022-12-17 | Disposition: A | Discharge status: Home / Self Care | Payer: Commercial Managed Care - PPO | Attending: Emergency Medicine | Admitting: Emergency Medicine

## 2022-12-16 ENCOUNTER — Encounter (HOSPITAL_COMMUNITY): Payer: Self-pay | Admitting: *Deleted

## 2022-12-16 ENCOUNTER — Other Ambulatory Visit: Payer: Self-pay

## 2022-12-16 ENCOUNTER — Emergency Department (HOSPITAL_COMMUNITY): Payer: Commercial Managed Care - PPO

## 2022-12-16 DIAGNOSIS — I6381 Other cerebral infarction due to occlusion or stenosis of small artery: Secondary | ICD-10-CM | POA: Diagnosis not present

## 2022-12-16 DIAGNOSIS — R519 Headache, unspecified: Secondary | ICD-10-CM

## 2022-12-16 DIAGNOSIS — Z8673 Personal history of transient ischemic attack (TIA), and cerebral infarction without residual deficits: Secondary | ICD-10-CM | POA: Diagnosis not present

## 2022-12-16 DIAGNOSIS — Z7982 Long term (current) use of aspirin: Secondary | ICD-10-CM | POA: Insufficient documentation

## 2022-12-16 DIAGNOSIS — Z91040 Latex allergy status: Secondary | ICD-10-CM | POA: Diagnosis not present

## 2022-12-16 DIAGNOSIS — Z7951 Long term (current) use of inhaled steroids: Secondary | ICD-10-CM | POA: Diagnosis not present

## 2022-12-16 DIAGNOSIS — G43109 Migraine with aura, not intractable, without status migrainosus: Secondary | ICD-10-CM

## 2022-12-16 DIAGNOSIS — J45909 Unspecified asthma, uncomplicated: Secondary | ICD-10-CM | POA: Insufficient documentation

## 2022-12-16 DIAGNOSIS — R531 Weakness: Secondary | ICD-10-CM | POA: Insufficient documentation

## 2022-12-16 DIAGNOSIS — R29818 Other symptoms and signs involving the nervous system: Secondary | ICD-10-CM | POA: Diagnosis not present

## 2022-12-16 HISTORY — DX: Cerebral infarction, unspecified: I63.9

## 2022-12-16 HISTORY — DX: Migraine, unspecified, not intractable, without status migrainosus: G43.909

## 2022-12-16 LAB — CBC
HCT: 36 % (ref 36.0–46.0)
Hemoglobin: 11.2 g/dL — ABNORMAL LOW (ref 12.0–15.0)
MCH: 24.3 pg — ABNORMAL LOW (ref 26.0–34.0)
MCHC: 31.1 g/dL (ref 30.0–36.0)
MCV: 78.1 fL — ABNORMAL LOW (ref 80.0–100.0)
Platelets: 364 10*3/uL (ref 150–400)
RBC: 4.61 MIL/uL (ref 3.87–5.11)
RDW: 14.9 % (ref 11.5–15.5)
WBC: 12 10*3/uL — ABNORMAL HIGH (ref 4.0–10.5)
nRBC: 0 % (ref 0.0–0.2)

## 2022-12-16 LAB — BASIC METABOLIC PANEL
Anion gap: 7 (ref 5–15)
BUN: 9 mg/dL (ref 6–20)
CO2: 26 mmol/L (ref 22–32)
Calcium: 8.6 mg/dL — ABNORMAL LOW (ref 8.9–10.3)
Chloride: 100 mmol/L (ref 98–111)
Creatinine, Ser: 0.73 mg/dL (ref 0.44–1.00)
GFR, Estimated: >60 mL/min (ref >60)
Glucose, Bld: 100 mg/dL — ABNORMAL HIGH (ref 70–99)
Potassium: 3.5 mmol/L (ref 3.5–5.1)
Sodium: 133 mmol/L — ABNORMAL LOW (ref 135–145)

## 2022-12-16 MED ORDER — METOCLOPRAMIDE HCL 5 MG/ML IJ SOLN
10.0000 mg | Freq: Once | INTRAMUSCULAR | Status: AC
  Administered 2022-12-16: 10 mg via INTRAVENOUS
  Filled 2022-12-16: qty 2

## 2022-12-16 MED ORDER — PROCHLORPERAZINE EDISYLATE 10 MG/2ML IJ SOLN
5.0000 mg | Freq: Once | INTRAMUSCULAR | Status: AC
  Administered 2022-12-16: 5 mg via INTRAVENOUS
  Filled 2022-12-16: qty 2

## 2022-12-16 MED ORDER — IOHEXOL 350 MG/ML SOLN
75.0000 mL | Freq: Once | INTRAVENOUS | Status: AC | PRN
  Administered 2022-12-16: 75 mL via INTRAVENOUS

## 2022-12-16 MED ORDER — KETOROLAC TROMETHAMINE 15 MG/ML IJ SOLN
15.0000 mg | Freq: Once | INTRAMUSCULAR | Status: AC
  Administered 2022-12-16: 15 mg via INTRAVENOUS
  Filled 2022-12-16: qty 1

## 2022-12-16 MED ORDER — SODIUM CHLORIDE 0.9 % IV BOLUS
500.0000 mL | Freq: Once | INTRAVENOUS | Status: AC
  Administered 2022-12-16: 500 mL via INTRAVENOUS

## 2022-12-16 NOTE — ED Triage Notes (Signed)
Pt with weakness to left hand on way to work this morning at [place].  Still has tingling to left hand.  + HA x 2 days. Bilateral leg weakness this afternoon.

## 2022-12-16 NOTE — ED Notes (Signed)
ED Provider at bedside. 

## 2022-12-16 NOTE — ED Provider Notes (Signed)
[facility] EMERGENCY DEPARTMENT Provider Note   CSN: [id number] Arrival date & time: 12/16/22  1857     History  Chief Complaint  Patient presents with   Weakness    Melissa Livingston is a 27 y.o. female.   Weakness Patient presents with weakness and some numbness in her left hand.  Noticed it going to work at around 745 this morning.  Slight tingling in the hand still feels weak.  Has a history of chronic migraines.  Gets about 14 of a month.  States she has had on for the last 2 days.  States legs both feel little weak this afternoon.  However Ver has been able to ambulate.  Patient is left-handed.  States she had a driver the right hand because the left hand felt off.  He has been seen by Dr. Lavell Anchors from neurology.  Had a recent MRI that did show old small stroke.  Unknown chronicity however.  The headache she has had for the last 2 days is not different from previous headaches.  Has not had neurodeficits with headaches in the past.    Past Medical History:  Diagnosis Date   Anemia    Anxiety    Asthma    as child   Endometritis 01/11/2015   GERD (gastroesophageal reflux disease)    Headache    Hematochezia 05/08/2015   History of ovarian cyst 01/28/2015   IBS (irritable bowel syndrome)    Infection    UTI   Migraine headache    Nausea and vomiting 01/11/2015   Ovarian cyst 01/14/2015   Pelvic pain in female 01/11/2015   Pregnancy induced hypertension    with pregnancy   Pregnant 02/05/2016   Rectal bleeding 07/18/2013   Stroke [facility])     Home Medications Prior to Admission medications   Medication Sig Start Date End Date Taking? Authorizing Provider  acetaminophen (TYLENOL) 500 MG tablet Take 2 tablets (1,000 mg total) by mouth every 6 (six) hours as needed for mild pain, moderate pain or headache. 08/26/20  Yes Arrie Senate, MD  albuterol (VENTOLIN HFA) 108 (90 Base) MCG/ACT inhaler Inhale 1-2 puffs into the lungs every 6 (six) hours as needed for wheezing or  shortness of breath. 10/14/20  Yes Wurst, [place], PA-C  aspirin EC 81 MG tablet Take 81 mg by mouth daily. Swallow whole.   Yes [provider]  cetirizine (ZYRTEC ALLERGY) 10 MG tablet Take 1 tablet (10 mg total) by mouth daily. 10/03/21  Yes Jaynee Eagles, PA-C  diclofenac (VOLTAREN) 75 MG EC tablet Take 1 tablet (75 mg total) by mouth 2 (two) times daily. 08/28/21  Yes Kathyrn Drown, MD  etonogestrel (NEXPLANON) 68 MG IMPL implant 1 each by Subdermal route once.   Yes [provider]  Galcanezumab-gnlm (EMGALITY) 120 MG/ML SOAJ Inject 1 Pen into the skin every 30 (thirty) days. 12/09/22  Yes Star Age, MD  ondansetron (ZOFRAN-ODT) 4 MG disintegrating tablet Take 1-2 tablets (4-8 mg total) by mouth every 8 (eight) hours as needed. 11/23/22  Yes Melvenia Beam, MD  rizatriptan (MAXALT-MLT) 10 MG disintegrating tablet Take 1 tablet (10 mg total) by mouth as needed for migraine. May repeat in 2 hours if needed 11/23/22  Yes Melvenia Beam, MD  tiZANidine (ZANAFLEX) 2 MG tablet Take 1 tablet (2 mg total) by mouth every 6 (six) hours as needed for muscle spasms. 07/28/22  Yes Estill Dooms, NP  traZODone (DESYREL) 50 MG tablet Take 0.5-1 tablets (25-50  mg total) by mouth at bedtime as needed for sleep. 07/04/21  Yes Luking, Elayne Snare, MD  Rimegepant Sulfate (NURTEC) 75 MG TBDP Take 75 mg by mouth every other day. Patient not taking: Reported on 12/16/2022 09/21/22   Ameduite, Trenton Gammon, FNP      Allergies    Adhesive [tape], Lorabid [loracarbef], Latex, and Orange fruit [citrus]    Review of Systems   Review of Systems  Neurological:  Positive for weakness.    Physical Exam Updated Vital Signs BP 124/77   Pulse 97   Temp 98.3 F (36.8 C) (Oral)   Resp 17   Ht '5\' 2"'$  (1.575 m)   Wt 90.7 kg   LMP 12/12/2022   SpO2 99%   BMI 36.58 kg/m  Physical Exam Vitals and nursing note reviewed.  Constitutional:      Appearance: Normal appearance.  HENT:     Head:  Atraumatic.  Eyes:     Extraocular Movements: Extraocular movements intact.     Pupils: Pupils are equal, round, and reactive to light.  Cardiovascular:     Rate and Rhythm: Regular rhythm.  Abdominal:     Tenderness: There is no abdominal tenderness.  Musculoskeletal:        General: No tenderness.  Skin:    General: Skin is warm.  Neurological:     Mental Status: She is alert and oriented to person, place, and time.     Comments: Face symmetric eye movements intact.  Has mildly decreased strength in left upper extremity/hand.  Slight paresthesias.  Grossly equal strength and both lower extremities however.     ED Results / Procedures / Treatments   Labs (all labs ordered are listed, but only abnormal results are displayed) Labs Reviewed  BASIC METABOLIC PANEL - Abnormal; Notable for the following components:      Result Value   Sodium 133 (*)    Glucose, Bld 100 (*)    Calcium 8.6 (*)    All other components within normal limits  CBC - Abnormal; Notable for the following components:   WBC 12.0 (*)    Hemoglobin 11.2 (*)    MCV 78.1 (*)    MCH 24.3 (*)    All other components within normal limits    EKG None  Radiology CT ANGIO HEAD NECK W WO CM  Result Date: 12/16/2022 CLINICAL DATA:  Initial evaluation for neuro deficit, stroke suspected. EXAM: CT ANGIOGRAPHY HEAD AND NECK TECHNIQUE: Multidetector CT imaging of the head and neck was performed using the standard protocol during bolus administration of intravenous contrast. Multiplanar CT image reconstructions and MIPs were obtained to evaluate the vascular anatomy. Carotid stenosis measurements (when applicable) are obtained utilizing NASCET criteria, using the distal internal carotid diameter as the denominator. RADIATION DOSE REDUCTION: This exam was performed according to the departmental dose-optimization program which includes automated exposure control, adjustment of the mA and/or kV according to patient size and/or  use of iterative reconstruction technique. CONTRAST:  51m OMNIPAQUE IOHEXOL 350 MG/ML SOLN COMPARISON:  Prior study from 11/28/2022. FINDINGS: CT HEAD FINDINGS Brain: Cerebral volume within normal limits. Remote left basal gangliar lacunar infarct noted. No acute intracranial hemorrhage. No acute large vessel territory infarct. No mass lesion or midline shift. No hydrocephalus or extra-axial fluid collection. Vascular: No abnormal hyperdense vessel. Skull: Scalp soft tissues and calvarium within normal limits. Sinuses/Orbits: Globes orbital soft tissues within normal limits. Paranasal sinuses and mastoid air cells are clear. Other: None. Review of the MIP images confirms  the above findings CTA NECK FINDINGS Aortic arch: Visualized aortic arch normal in caliber with standard 3 vessel morphology. No stenosis about the origin the great vessels. Right carotid system: Right common and internal carotid arteries widely patent without stenosis, dissection or occlusion. Left carotid system: Left common and internal carotid arteries widely patent without stenosis, dissection or occlusion. Vertebral arteries: Both vertebral arteries arise from the subclavian arteries. No proximal subclavian artery stenosis. Both vertebral arteries widely patent without stenosis, dissection or occlusion. Skeleton: No discrete or worrisome osseous lesions. Other neck: No other acute soft tissue abnormality within the neck. Upper chest: Visualized upper chest demonstrates no acute finding. Review of the MIP images confirms the above findings CTA HEAD FINDINGS Anterior circulation: Both internal carotid arteries widely patent to the termini without stenosis. A1 segments widely patent. Normal anterior communicating artery complex. Both anterior cerebral arteries widely patent to their distal aspects without stenosis. No M1 stenosis or occlusion. Normal right MCA bifurcation. Left MCA trifurcates. Distal MCA branches well perfused and symmetric.  Posterior circulation: Both vertebral arteries patent without stenosis. Left PICA patent. Right PICA not seen. Basilar patent without stenosis. Superior cerebellar and posterior cerebral arteries patent bilaterally. Venous sinuses: Patent allowing for timing the contrast bolus. Anatomic variants: None significant.  No aneurysm. Review of the MIP images confirms the above findings IMPRESSION: 1. Normal CTA of the head and neck. No large vessel occlusion, hemodynamically significant stenosis, or other acute vascular abnormality. 2. No other acute intracranial abnormality. 3. Remote left basal gangliar lacunar infarct. Electronically Signed   By: Jeannine Boga M.D.   On: 12/16/2022 22:00    Procedures Procedures    Medications Ordered in ED Medications  ketorolac (TORADOL) 15 MG/ML injection 15 mg (15 mg Intravenous Given 12/16/22 2013)  metoCLOPramide (REGLAN) injection 10 mg (10 mg Intravenous Given 12/16/22 2013)  sodium chloride 0.9 % bolus 500 mL (0 mLs Intravenous Stopped 12/16/22 2101)  iohexol (OMNIPAQUE) 350 MG/ML injection 75 mL (75 mLs Intravenous Contrast Given 12/16/22 2117)    ED Course/ Medical Decision Making/ A&P                           Medical Decision Making Amount and/or Complexity of Data Reviewed Labs: ordered. Radiology: ordered.  Risk Prescription drug management.   Patient with left hand weakness.  Began this morning.  Associate with headache.  Does have chronic headaches however.  Tingling of the hand.  Potentially even mild weakness.  Good exam on lower extremities but states they feel weak.  Discussed with Dr. Rory Percy from neurology.  It is a little more worrisome because did have a stroke on recent MRI.  Although unknown chronicity of the stroke and would not account for the deficits for today.  However potential would put her at higher risk.  Has not had deficits with complicated migraines in the past.  Plan was to get CT CTA along with the delay.  It does show  the old stroke.  However we are going to treat for potential complicated migraines with deficits completely resolved would discharge home.  If not we will need MRI.  Patient not a TNK candidate due to time of onset of around 12 hours ago. Patient feels a little better after the treatment but still continues to have left-sided weakness.  With the deficit will transfer down to [facility] for MRI.  Dr. Billy Fischer accepting.  If MRI negative presume complicated migraine.  If positive would  need more neuroevaluation.  CRITICAL CARE Performed by: Davonna Belling Total critical care time: 30 minutes Critical care time was exclusive of separately billable procedures and treating other patients. Critical care was necessary to treat or prevent imminent or life-threatening deterioration. Critical care was time spent personally by me on the following activities: development of treatment plan with patient and/or surrogate as well as nursing, discussions with consultants, evaluation of patient's response to treatment, examination of patient, obtaining history from patient or surrogate, ordering and performing treatments and interventions, ordering and review of laboratory studies, ordering and review of radiographic studies, pulse oximetry and re-evaluation of patient's condition.            Final Clinical Impression(s) / ED Diagnoses Final diagnoses:  Acute nonintractable headache, unspecified headache type  Weakness    Rx / DC Orders ED Discharge Orders     None         Davonna Belling, MD 12/16/22 2254

## 2022-12-16 NOTE — Progress Notes (Signed)
ON CALL PHONE CONSULT  Call from Dr. Pickering'[email or web address]'$  [facility] emergency room   Discussion: 27 year old with a history of headaches, recent MRI done in December with no acute abnormality but concern for a left basal ganglia chronic infarct-now coming in with headache for a few days but new symptoms since 7 AM of left-sided numbness and weakness.  Given her focal findings, definitely prudent to do a CT to rule out acute process.  Have also recommended doing a CT angio head and neck to make sure that there is no underlying vascular malformation because it is odd for a 27 year old to have had chronic stroke. I would recommend then treating her with a migraine cocktail if the CT head is negative for acute bleed. If the symptoms resolve with a migraine cocktail, no further inpatient workup-follow-up with Dr. 26. If the symptoms do not resolve, I would recommend transfer to [facility] for an MRI to look for any structural abnormality or other causes of headaches. -- [facility], MD Neurologist [facility] Pager: [phone]  Approximately 10 minutes of time were spent reviewing the case and discussion with Dr [phone], EDP, > 50% of which was spent in discussion.

## 2022-12-17 ENCOUNTER — Ambulatory Visit: Payer: Medicaid Other | Admitting: Orthopaedic Surgery

## 2022-12-17 ENCOUNTER — Emergency Department (HOSPITAL_COMMUNITY): Payer: Commercial Managed Care - PPO

## 2022-12-17 ENCOUNTER — Telehealth: Payer: Self-pay | Admitting: *Deleted

## 2022-12-17 DIAGNOSIS — J45909 Unspecified asthma, uncomplicated: Secondary | ICD-10-CM | POA: Diagnosis not present

## 2022-12-17 DIAGNOSIS — Z91040 Latex allergy status: Secondary | ICD-10-CM | POA: Diagnosis not present

## 2022-12-17 DIAGNOSIS — Z7982 Long term (current) use of aspirin: Secondary | ICD-10-CM | POA: Diagnosis not present

## 2022-12-17 DIAGNOSIS — R519 Headache, unspecified: Secondary | ICD-10-CM | POA: Diagnosis not present

## 2022-12-17 DIAGNOSIS — Z7951 Long term (current) use of inhaled steroids: Secondary | ICD-10-CM | POA: Diagnosis not present

## 2022-12-17 DIAGNOSIS — Z8673 Personal history of transient ischemic attack (TIA), and cerebral infarction without residual deficits: Secondary | ICD-10-CM | POA: Diagnosis not present

## 2022-12-17 DIAGNOSIS — R531 Weakness: Secondary | ICD-10-CM | POA: Diagnosis not present

## 2022-12-17 DIAGNOSIS — R29818 Other symptoms and signs involving the nervous system: Secondary | ICD-10-CM | POA: Diagnosis not present

## 2022-12-17 DIAGNOSIS — I6381 Other cerebral infarction due to occlusion or stenosis of small artery: Secondary | ICD-10-CM | POA: Diagnosis not present

## 2022-12-17 NOTE — ED Provider Notes (Signed)
Received in transfer for MRI of head secondary to headache and weakness, numbness  NCAT EOMI RRR CTAB NABS  Case d/w Dr. Cheral Marker, no acute stroke, safe for discharge  MRI without acute changes    Antinio Sanderfer, MD 12/17/22 [id number]

## 2022-12-17 NOTE — Telephone Encounter (Signed)
Melissa Livingston (KeyRiccardo Dubin) Rx #: [id number] Emgality '120MG'$ /ML auto-injectors (migraine)   PA Emgality complete waiting on approval

## 2022-12-17 NOTE — ED Notes (Signed)
This RN spoke with Joellen Jersey from MRI who reports they will send for her as soon as possible

## 2022-12-17 NOTE — ED Notes (Signed)
Discharge instructions discussed with pt. Verbalized understanding. VSS. No questions or concerns regarding discharge  

## 2022-12-25 ENCOUNTER — Other Ambulatory Visit: Payer: Self-pay

## 2022-12-25 ENCOUNTER — Ambulatory Visit
Admission: EM | Admit: 2022-12-25 | Discharge: 2022-12-25 | Disposition: A | Discharge status: Home / Self Care | Payer: Commercial Managed Care - PPO | Attending: Emergency Medicine | Admitting: Emergency Medicine

## 2022-12-25 ENCOUNTER — Encounter: Payer: Self-pay | Admitting: Emergency Medicine

## 2022-12-25 DIAGNOSIS — G43809 Other migraine, not intractable, without status migrainosus: Secondary | ICD-10-CM

## 2022-12-25 MED ORDER — KETOROLAC TROMETHAMINE 30 MG/ML IJ SOLN
30.0000 mg | Freq: Once | INTRAMUSCULAR | Status: AC
  Administered 2022-12-25: 30 mg via INTRAMUSCULAR

## 2022-12-25 MED ORDER — NAPROXEN 500 MG PO TABS: 500.0000 mg | ORAL_TABLET | Freq: Two times a day (BID) | ORAL | 0 refills | Status: DC

## 2022-12-25 MED ORDER — ACETAMINOPHEN 500 MG PO TABS
1000.0000 mg | ORAL_TABLET | Freq: Once | ORAL | Status: AC
  Administered 2022-12-25: 1000 mg via ORAL

## 2022-12-25 MED ORDER — ACETAMINOPHEN 325 MG PO TABS: 975.0000 mg | ORAL_TABLET | Freq: Once | ORAL | Status: DC

## 2022-12-25 MED ORDER — DEXAMETHASONE SODIUM PHOSPHATE 10 MG/ML IJ SOLN
10.0000 mg | Freq: Once | INTRAMUSCULAR | Status: AC
  Administered 2022-12-25: 10 mg via INTRAMUSCULAR

## 2022-12-25 MED ORDER — ONDANSETRON 8 MG PO TBDP
8.0000 mg | ORAL_TABLET | Freq: Once | ORAL | Status: AC
  Administered 2022-12-25: 8 mg via ORAL

## 2022-12-25 NOTE — ED Triage Notes (Addendum)
Pt reports headache that started yesterday while at work, approximately 1030 am. Pt reports history of migraines and reports has taken baby aspirin, rizatriptan x2 doses, and zofran this afternoon. Pt states pain and nausea persists. Pt reports light and sound sensitivity as well.

## 2022-12-25 NOTE — ED Provider Notes (Signed)
HPI  SUBJECTIVE:  Melissa Livingston is a 27 y.o. female who reports a gradual onset, pulsing, throbbing headache located posteriorly and behind her eyes worse on the left starting yesterday.  She reports nausea, photophobia, phonophobia.  She had 6 hours of left arm numbness and tingling today, which is identical to the symptoms she had last week she was evaluated in the ER.  She tried rizatriptan x 2, Zofran, combination Tylenol/ibuprofen.  The rizatriptan helped.  Symptoms are worse with light, noise, smells.  She thinks this was triggered by stress at work.  She states she gets 14 migraines per month.  No vomiting, fevers, slurred speech, visual changes, facial droop, arm or leg weakness, purulent nasal discharge, nasal congestion, jaw or dental pain, sinus pain/pressure facial droop, neck stiffness, rash, syncope, seizures.  Patient has a past medical history of chronic migraines, asthma, hypertension, IBS, old stroke seen on MRI from December 23.  She is on 81 mg aspirin daily to prevent further strokes..  She is followed by [facility] for her headaches.  Last visit was on 11/23/2022, where she was prescribed rizatriptan and Zofran.  She had an MRI on 11/28/2022 that showed an old infarct.  She was seen in the ED 9 days ago for weakness, numbness in the left hand accompanied with headache.  She was transferred to [facility] for an MRI, which showed no acute changes.  No past medical history of hypertension, atrial fibrillation, temporal arteritis, glaucoma.  LMP: 1/6.  Denies possibility of being pregnant.  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   Past Medical History:  Diagnosis Date   Anemia    Anxiety    Asthma    as child   Endometritis 01/11/2015   GERD (gastroesophageal reflux disease)    Headache    Hematochezia 05/08/2015   History of ovarian cyst 01/28/2015   IBS (irritable bowel syndrome)    Infection    UTI   Migraine headache    Nausea and vomiting 01/11/2015    Ovarian cyst 01/14/2015   Pelvic pain in female 01/11/2015   Pregnancy induced hypertension    with pregnancy   Pregnant 02/05/2016   Rectal bleeding 07/18/2013   Stroke [facility])     Past Surgical History:  Procedure Laterality Date   CESAREAN SECTION N/A 09/29/2014   Procedure: CESAREAN SECTION;  Surgeon: Jonnie Kind, MD;  Location: [facility];  Service: Obstetrics;  Laterality: N/A;   CESAREAN SECTION N/A 09/25/2016   Procedure: CESAREAN SECTION;  Surgeon: Jonnie Kind, MD;  Location: [place];  Service: Obstetrics;  Laterality: N/A;   CESAREAN SECTION N/A 08/25/2020   Procedure: CESAREAN SECTION;  Surgeon: Florian Buff, MD;  Location: [facility];  Service: Obstetrics;  Laterality: N/A;   CHOLECYSTECTOMY N/A 03/07/2018   Procedure: LAPAROSCOPIC CHOLECYSTECTOMY;  Surgeon: Aviva Signs, MD;  Location: [facility];  Service: General;  Laterality: N/A;   COLONOSCOPY WITH PROPOFOL N/A 08/02/2017   Dr. Gala Romney: moderate internal hemorhoids, distal 5 cm of TI also appeared normal   ESOPHAGOGASTRODUODENOSCOPY (EGD) WITH ESOPHAGEAL DILATION N/A 07/19/2013   Dr. Vivi Ferns appearing esophagus s/p dilation, normal D1 and D2, unremarkable path    ESOPHAGOGASTRODUODENOSCOPY (EGD) WITH PROPOFOL N/A 08/02/2017   Normal esophagus, small hiatal hernia, normal duodenum   none     WISDOM TOOTH EXTRACTION      Family History  Problem Relation Age of Onset   Multiple sclerosis Mother    Migraines Mother    Hypertension Father  Hypertension Paternal Aunt    Heart disease Paternal Uncle    Diabetes Paternal Uncle    Hypertension Paternal Uncle    Heart disease Maternal Grandfather    Cancer Other        father's aunt had breast cancer   Colon cancer Other        maternal great uncle, age greater than 70   Crohn's disease Other        couple of family members on father's side of family    Social History   Tobacco Use   Smoking status: Never   Smokeless tobacco: Never  Vaping Use    Vaping Use: Never used  Substance Use Topics   Alcohol use: No    Alcohol/week: 0.0 standard drinks of alcohol   Drug use: No    No current facility-administered medications for this encounter.  Current Outpatient Medications:    naproxen (NAPROSYN) 500 MG tablet, Take 1 tablet (500 mg total) by mouth 2 (two) times daily., Disp: 20 tablet, Rfl: 0   ondansetron (ZOFRAN-ODT) 4 MG disintegrating tablet, Take 1-2 tablets (4-8 mg total) by mouth every 8 (eight) hours as needed., Disp: 30 tablet, Rfl: 3   acetaminophen (TYLENOL) 500 MG tablet, Take 2 tablets (1,000 mg total) by mouth every 6 (six) hours as needed for mild pain, moderate pain or headache., Disp: 60 tablet, Rfl: 0   albuterol (VENTOLIN HFA) 108 (90 Base) MCG/ACT inhaler, Inhale 1-2 puffs into the lungs every 6 (six) hours as needed for wheezing or shortness of breath., Disp: 18 g, Rfl: 0   aspirin EC 81 MG tablet, Take 81 mg by mouth daily. Swallow whole., Disp: , Rfl:    cetirizine (ZYRTEC ALLERGY) 10 MG tablet, Take 1 tablet (10 mg total) by mouth daily., Disp: 30 tablet, Rfl: 0   etonogestrel (NEXPLANON) 68 MG IMPL implant, 1 each by Subdermal route once., Disp: , Rfl:    Galcanezumab-gnlm (EMGALITY) 120 MG/ML SOAJ, Inject 1 Pen into the skin every 30 (thirty) days., Disp: 1.12 mL, Rfl: 11   Rimegepant Sulfate (NURTEC) 75 MG TBDP, Take 75 mg by mouth every other day. (Patient not taking: Reported on 12/16/2022), Disp: 18 tablet, Rfl: 2   rizatriptan (MAXALT-MLT) 10 MG disintegrating tablet, Take 1 tablet (10 mg total) by mouth as needed for migraine. May repeat in 2 hours if needed, Disp: 9 tablet, Rfl: 11   tiZANidine (ZANAFLEX) 2 MG tablet, Take 1 tablet (2 mg total) by mouth every 6 (six) hours as needed for muscle spasms., Disp: 30 tablet, Rfl: 0   traZODone (DESYREL) 50 MG tablet, Take 0.5-1 tablets (25-50 mg total) by mouth at bedtime as needed for sleep., Disp: 30 tablet, Rfl: 4  Allergies  Allergen Reactions   Adhesive  [Tape] Other (See Comments)    Blisters    Lorabid [Loracarbef] Hives, Swelling and Other (See Comments)    Mouth swelling Can take amoxil and augmentin    Latex Itching and Rash   Orange Fruit [Citrus] Rash     ROS  As noted in HPI.   Physical Exam  BP 124/76 (BP Location: Right Arm)   Pulse 92   Temp 97.9 F (36.6 C) (Oral)   Resp 18   LMP 12/12/2022   SpO2 100%   Constitutional: Well developed, well nourished, lying with her eyes covered Eyes: PERRL, EOMI, conjunctiva normal bilaterally.  Positive photophobia. HENT: Normocephalic, atraumatic,mucus membranes moist, normal nontender dentition.  TM normal b/l. No TMJ tenderness. No nasal congestion,  normal turbinates.  Positive maxillary, frontal sinus tenderness.  Positive diffuse tenderness over the left head.  No temporal artery tenderness.  Neck: no cervical LN positive left trapezial muscle tenderness. No meningismus Respiratory: normal inspiratory effort Cardiovascular: Normal rate, regular rhythm GI:  nondistended skin: No rash, skin intact Musculoskeletal: No edema, no tenderness, no deformities Neurologic: Alert & oriented x 3, CN III-XII intact, romberg neg, finger-> nose, heel-> shin equal b/l, Romberg neg, tandem gait steady Psychiatric: Speech and behavior appropriate   ED Course   Medications  ketorolac (TORADOL) 30 MG/ML injection 30 mg (30 mg Intramuscular Given 12/25/22 1904)  dexamethasone (DECADRON) injection 10 mg (10 mg Intramuscular Given 12/25/22 1904)  ondansetron (ZOFRAN-ODT) disintegrating tablet 8 mg (8 mg Oral Given 12/25/22 1902)  acetaminophen (TYLENOL) tablet 1,000 mg (1,000 mg Oral Given 12/25/22 1901)    No orders of the defined types were placed in this encounter.  No results found for this or any previous visit (from the past 24 hour(s)). No results found.   ED Clinical Impression  1. Other migraine without status migrainosus, not intractable     ED Assessment/Plan     ER  and neurology notes extensively reviewed.  As noted in HPI.  Pt describing typical pain, no sudden onset. Doubt SAH, ICH or space occupying lesion. Pt without fevers/chills, Pt has no meningeal sx, no nuchal rigidity. Doubt meningitis. Pt with normal neuro exam, no evidence of CVA/TIA.  Pt BP not elevated significantly, doubt hypertensive emergency. No evidence of temporal artery tenderness, no evidence of glaucoma or other ocular pathology. Will give headache cocktail (dexamethasone 10 IM, zofran 8 po, toradol 30 IM, Tylenol 1000 mg po), and reassess.  Pt much improved after medications. Pt with continued non-focal neuro exam. Will d/c home with Naprosyn/Tylenol, continue her triptans, Zofran and have her follow-up with neurology ASAP.  Discussed MDM, plan for follow up, signs and sx that should prompt return to ER. Pt agrees with plan  Meds ordered this encounter  Medications   DISCONTD: acetaminophen (TYLENOL) tablet 975 mg   ketorolac (TORADOL) 30 MG/ML injection 30 mg   dexamethasone (DECADRON) injection 10 mg   ondansetron (ZOFRAN-ODT) disintegrating tablet 8 mg   acetaminophen (TYLENOL) tablet 1,000 mg   naproxen (NAPROSYN) 500 MG tablet    Sig: Take 1 tablet (500 mg total) by mouth 2 (two) times daily.    Dispense:  20 tablet    Refill:  0    *This clinic note was created using [employer]. Therefore, there may be occasional mistakes despite careful proofreading.  ?    Melynda Ripple, MD 12/25/22 1952

## 2022-12-25 NOTE — ED Notes (Signed)
Ice pack provided

## 2022-12-25 NOTE — Discharge Instructions (Signed)
Take 500 mg 9%, and 1000 mg of Tylenol twice a day.  May take an additional 1000 mg Tylenol 1 more time a day.  Continue the triptans as prescribed, Zofran with the triptans and the NSAIDs/Tylenol is a very effective combination for migraine headaches.  Go to the ER for any strokelike symptoms.

## 2022-12-25 NOTE — ED Notes (Addendum)
New ice pack and ice water provided. Lights turned down in room. Pt laying back in chair with feet elevated for comfort.   Provider discussed with pt care plan regarding re-evaluation approx 45 minutes from injection administration time.

## 2022-12-28 ENCOUNTER — Encounter: Payer: Self-pay | Admitting: Neurology

## 2022-12-28 ENCOUNTER — Ambulatory Visit: Payer: Commercial Managed Care - PPO | Admitting: Neurology

## 2022-12-28 VITALS — BP 121/75 | HR 82 | Ht 62.0 in | Wt 205.2 lb

## 2022-12-28 DIAGNOSIS — I63512 Cerebral infarction due to unspecified occlusion or stenosis of left middle cerebral artery: Secondary | ICD-10-CM | POA: Diagnosis not present

## 2022-12-28 DIAGNOSIS — E785 Hyperlipidemia, unspecified: Secondary | ICD-10-CM | POA: Diagnosis not present

## 2022-12-28 DIAGNOSIS — R7309 Other abnormal glucose: Secondary | ICD-10-CM | POA: Diagnosis not present

## 2022-12-28 DIAGNOSIS — D6859 Other primary thrombophilia: Secondary | ICD-10-CM

## 2022-12-28 DIAGNOSIS — I6381 Other cerebral infarction due to occlusion or stenosis of small artery: Secondary | ICD-10-CM | POA: Diagnosis not present

## 2022-12-28 DIAGNOSIS — G43109 Migraine with aura, not intractable, without status migrainosus: Secondary | ICD-10-CM

## 2022-12-28 MED ORDER — UBRELVY 100 MG PO TABS: 100.0000 mg | ORAL_TABLET | ORAL | 0 refills | Status: DC | PRN

## 2022-12-28 NOTE — Progress Notes (Addendum)
[id number]    Provider:  Dr Jaynee Eagles Requesting Provider: Kathyrn Drown, MD Primary Care Provider:  Kathyrn Drown, MD  CC:  migraines  Addendum: Patient doing great now 4 migraines a month and < 10 total headache days a month. Order Roselyn Meier prn.   12/29/2022: MRI showed a remote lacunar infarct, patient back to discuss with fiance.  We saw patient for migraines, MRI of the brain showed a remote lacunar infarct at the left basal ganglia.  Unfortunately there is no telling how old this lacunar infarct is, could have even been in utero.  We reviewed the images together with her fianc, discussed ischemic strokes lacunar infarcts embolic type events and all the different types of cerebrovascular accidents that can happen.  Hers looks like lacunar.  Despite remote she needs a thorough evaluation including imaging of her blood vessels, a complete hypercoagulable profile, lipids, hemoglobin A1c, we have to mitigate every risk for stroke.  We discussed in detail answered all questions.  MRI brain 12/16/2022: Personally reviewed with patient and completely agree. IMPRESSION: 1. No acute intracranial abnormality. 2. 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   CTA H&N: IMPRESSION: 1. Normal CTA of the head and neck. No large vessel occlusion, hemodynamically significant stenosis, or other acute vascular abnormality. 2. No other acute intracranial abnormality. 3. Remote left basal gangliar lacunar infarct.  Patient complains of symptoms per HPI as well as the following symptoms: stroke . Pertinent negatives and positives per HPI. All others negative   HPI:  Melissa Livingston is a 27 y.o. female here as requested by Kathyrn Drown, MD for migraines. PMHx HTN, asthma, IBS,gerd, left sciatica, weight gain, obesity, headache, constipation, anxiety with depression. She started with migraines since the age of 77. They severe enough she can;t get out of work. They  start in the back of front, pulsating/pounding.throbbing, in the temples, when she laughs a lof the back of the head will hurt, occ she gets blurry, hurts in there neck with valsalva or pushing down, morning headaches and nocturnal headaches worse supine. Pulsating/pounding/throbbing, nausa, light sensitivity, daull headache almost every day and >14 migraine days a month, she had side effects to the topiramate couldn;t tolerate a higher dose, they can be moreate to severe and on average lasts 48 hours, she has a lot neck pain and radiates into the neck. Nothing makes it better. Trggers are bright lights. Cetain smells can triggers. For over year. No aura. No medication overuse, No other focal neurologic deficits, associated symptoms, inciting events or modifiable factors.   Reviewed notes, labs and imaging from outside physicians, which showed:   01/09/2019: CT cervical spine  EXAM: CT CERVICAL SPINE WITHOUT CONTRAST   TECHNIQUE: Multidetector CT imaging of the cervical spine was performed without intravenous contrast. Multiplanar CT image reconstructions were also generated.   COMPARISON:  None.   FINDINGS: Alignment: There is reversal of the usual cervical lordosis. This is nonspecific and can be due to patient positioning but ligamentous injury or muscle spasm could also have this appearance and are not excluded. MRI is more sensitive for evaluation of ligamentous injury if clinically indicated. No anterior subluxation. Normal alignment of the facet joints. C1-2 articulation appears intact.   Skull base and vertebrae: No vertebral compression deformities. No focal bone lesion or bone destruction. Bone cortex appears intact.   Soft tissues and spinal canal: No paraspinal soft tissue mass or infiltration. No prevertebral soft tissue swelling.   Disc levels:  Intervertebral disc space heights are preserved.   Upper chest: Lung apices are clear.   Other: None.    IMPRESSION: Nonspecific reversal of the usual cervical lordosis. This could be positional or may indicate ligamentous injury. MRI would be more sensitive for evaluation of ligamentous injury if clinically indicated. No acute displaced fractures identified.  From a thorough review of records, meds treid that can be used in migraine managemen tinclude: tylenol, aspirin, fioticet, celexa, flexeril, propranolol contraindicated due to asthma, voltaren, benadryl, lexapro, gabapentin, labetalol, ibuprofen, ketorolac, magnesium, mobic, solumedrol, reglan, naproxen, nortriptyline, zofran, oxy, phenergen, imitrex, tizanidine, topamax  Review of Systems: Patient complains of symptoms per HPI as well as the following symptoms migraines. Pertinent negatives and positives per HPI. All others negative.   Social History   Socioeconomic History   Marital status: Significant Other    Spouse name: Not on file   Number of children: 2   Years of education: Not on file   Highest education level: Some college, no degree  Occupational History   Occupation: out due to knee  Tobacco Use   Smoking status: Never   Smokeless tobacco: Never  Vaping Use   Vaping Use: Never used  Substance and Sexual Activity   Alcohol use: No    Alcohol/week: 0.0 standard drinks of alcohol   Drug use: No   Sexual activity: Yes    Birth control/protection: Implant  Other Topics Concern   Not on file  Social History Narrative   Not on file   Social Determinants of Health   Financial Resource Strain: Medium Risk (07/28/2022)   Overall Financial Resource Strain (CARDIA)    Difficulty of Paying Living Expenses: Somewhat hard  Food Insecurity: No Food Insecurity (07/28/2022)   Hunger Vital Sign    Worried About Running Out of Food in the Last Year: Never true    Ran Out of Food in the Last Year: Never true  Transportation Needs: No Transportation Needs (07/28/2022)   PRAPARE - Transportation    Lack of Transportation  (Medical): No    Lack of Transportation (Non-Medical): No  Physical Activity: Insufficiently Active (07/28/2022)   Exercise Vital Sign    Days of Exercise per Week: 3 days    Minutes of Exercise per Session: 40 min  Stress: Stress Concern Present (07/28/2022)   Level Green    Feeling of Stress : Very much  Social Connections: Moderately Isolated (07/28/2022)   Social Connection and Isolation Panel [NHANES]    Frequency of Communication with Friends and Family: More than three times a week    Frequency of Social Gatherings with Friends and Family: More than three times a week    Attends Religious Services: More than 4 times per year    Active Member of [employer] or Organizations: No    Attends Archivist Meetings: Never    Marital Status: Never married  Intimate Partner Violence: Not At Risk (07/28/2022)   Humiliation, Afraid, Rape, and Kick questionnaire    Fear of Current or Ex-Partner: No    Emotionally Abused: No    Physically Abused: No    Sexually Abused: No    Family History  Problem Relation Age of Onset   Multiple sclerosis Mother    Migraines Mother    Hypertension Father    Hypertension Paternal Aunt    Heart disease Paternal Uncle    Diabetes Paternal Uncle    Hypertension Paternal Uncle    Heart disease Maternal  Grandfather    Cancer Other        father's aunt had breast cancer   Colon cancer Other        maternal great uncle, age greater than 71   Crohn's disease Other        couple of family members on father's side of family    Past Medical History:  Diagnosis Date   Anemia    Anxiety    Asthma    as child   Endometritis 01/11/2015   GERD (gastroesophageal reflux disease)    Headache    Hematochezia 05/08/2015   History of ovarian cyst 01/28/2015   IBS (irritable bowel syndrome)    Infection    UTI   Migraine headache    Nausea and vomiting 01/11/2015   Ovarian cyst 01/14/2015    Pelvic pain in female 01/11/2015   Pregnancy induced hypertension    with pregnancy   Pregnant 02/05/2016   Rectal bleeding 07/18/2013   Stroke [facility])     Patient Active Problem List   Diagnosis Date Noted   Chronic migraine without aura without status migrainosus, not intractable 11/23/2022   Abdominal wall pain in suprapubic region 07/28/2022   Dyspareunia, female 07/28/2022   Encounter for gynecological examination with Papanicolaou smear of cervix 07/28/2022   Sleep disturbance 06/05/2021   Nexplanon in place 01/22/2021   Weight gain 01/22/2021   Frequent headaches 01/22/2021   Pregnancy examination or test, negative result 01/22/2021   Swelling of both ankles 01/22/2021   Short of breath on exertion 01/22/2021   Nexplanon insertion 08/26/2020   Sepsis ([place]) 07/25/2020   Pneumonia due to COVID-19 virus 07/25/2020   Chronic hypertension 03/21/2020   Headache disorder 01/01/2019   Chronic pain of both knees 08/25/2018   Constipation 08/25/2018   Acute left-sided low back pain with left-sided sciatica 01/09/2017   Morbid obesity ([place]) 01/09/2017   S/P cesarean section 03/04/2016   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   GERD (gastroesophageal reflux disease) 07/18/2013   Irritable bowel syndrome 07/03/2013   Depression with anxiety 07/03/2013   Asthma 03/27/2013    Past Surgical History:  Procedure Laterality Date   CESAREAN SECTION N/A 09/29/2014   Procedure: CESAREAN SECTION;  Surgeon: Jonnie Kind, MD;  Location: [facility];  Service: Obstetrics;  Laterality: N/A;   CESAREAN SECTION N/A 09/25/2016   Procedure: CESAREAN SECTION;  Surgeon: Jonnie Kind, MD;  Location: [place];  Service: Obstetrics;  Laterality: N/A;   CESAREAN SECTION N/A 08/25/2020   Procedure: CESAREAN SECTION;  Surgeon: Florian Buff, MD;  Location: [facility];  Service: Obstetrics;  Laterality: N/A;   CHOLECYSTECTOMY N/A 03/07/2018   Procedure: LAPAROSCOPIC CHOLECYSTECTOMY;   Surgeon: Aviva Signs, MD;  Location: [facility];  Service: General;  Laterality: N/A;   COLONOSCOPY WITH PROPOFOL N/A 08/02/2017   Dr. Gala Romney: moderate internal hemorhoids, distal 5 cm of TI also appeared normal   ESOPHAGOGASTRODUODENOSCOPY (EGD) WITH ESOPHAGEAL DILATION N/A 07/19/2013   Dr. Vivi Ferns appearing esophagus s/p dilation, normal D1 and D2, unremarkable path    ESOPHAGOGASTRODUODENOSCOPY (EGD) WITH PROPOFOL N/A 08/02/2017   Normal esophagus, small hiatal hernia, normal duodenum   none     WISDOM TOOTH EXTRACTION      Current Outpatient Medications  Medication Sig Dispense Refill   acetaminophen (TYLENOL) 500 MG tablet Take 2 tablets (1,000 mg total) by mouth every 6 (six) hours as needed for mild pain, moderate pain or headache. 60 tablet 0   albuterol (VENTOLIN HFA)  108 (90 Base) MCG/ACT inhaler Inhale 1-2 puffs into the lungs every 6 (six) hours as needed for wheezing or shortness of breath. 18 g 0   aspirin EC 81 MG tablet Take 81 mg by mouth daily. Swallow whole.     cetirizine (ZYRTEC ALLERGY) 10 MG tablet Take 1 tablet (10 mg total) by mouth daily. 30 tablet 0   etonogestrel (NEXPLANON) 68 MG IMPL implant 1 each by Subdermal route once.     Galcanezumab-gnlm (EMGALITY) 120 MG/ML SOAJ Inject 1 Pen into the skin every 30 (thirty) days. 1.12 mL 11   naproxen (NAPROSYN) 500 MG tablet Take 1 tablet (500 mg total) by mouth 2 (two) times daily. 20 tablet 0   ondansetron (ZOFRAN-ODT) 4 MG disintegrating tablet Take 1-2 tablets (4-8 mg total) by mouth every 8 (eight) hours as needed. 30 tablet 3   Rimegepant Sulfate (NURTEC) 75 MG TBDP Take 75 mg by mouth every other day. 18 tablet 2   tiZANidine (ZANAFLEX) 2 MG tablet Take 1 tablet (2 mg total) by mouth every 6 (six) hours as needed for muscle spasms. 30 tablet 0   traZODone (DESYREL) 50 MG tablet Take 0.5-1 tablets (25-50 mg total) by mouth at bedtime as needed for sleep. 30 tablet 4   Ubrogepant (UBRELVY) 100 MG TABS Take 1 tablet  (100 mg total) by mouth every 2 (two) hours as needed. Maximum '200mg'$  a day. 4 tablet 0   No current facility-administered medications for this visit.    Allergies as of 12/28/2022 - Review Complete 12/28/2022  Allergen Reaction Noted   Adhesive [tape] Other (See Comments) 06/14/2015   Lorabid [loracarbef] Hives, Swelling, and Other (See Comments) 03/28/2013   Latex Itching and Rash 07/18/2013   Orange fruit [citrus] Rash 07/18/2013    Vitals: BP 121/75   Pulse 82   Ht '5\' 2"'$  (1.575 m)   Wt 205 lb 3.2 oz (93.1 kg)   LMP 12/12/2022   BMI 37.53 kg/m  Last Weight:  Wt Readings from Last 1 Encounters:  12/28/22 205 lb 3.2 oz (93.1 kg)   Last Height:   Ht Readings from Last 1 Encounters:  12/28/22 '5\' 2"'$  (1.575 m)     Physical exam: Exam: Gen: NAD, conversant, well nourised, obese, well groomed                     CV: RRR, no MRG. No Carotid Bruits. No peripheral edema, warm, nontender Eyes: Conjunctivae clear without exudates or hemorrhage  Neuro: Detailed Neurologic Exam  Speech:    Speech is normal; fluent and spontaneous with normal comprehension.  Cognition:    The patient is oriented to person, place, and time;     recent and remote memory intact;     language fluent;     normal attention, concentration,     fund of knowledge Cranial Nerves:    The pupils are equal, round, and reactive to light. The fundi are normal and spontaneous venous pulsations are present. Visual fields are full to finger confrontation. Extraocular movements are intact. Trigeminal sensation is intact and the muscles of mastication are normal. The face is symmetric. The palate elevates in the midline. Hearing intact. Voice is normal. Shoulder shrug is normal. The tongue has normal motion without fasciculations.   Coordination:    Normal finger to nose and heel to shin. Normal rapid alternating movements.   Gait:    Heel-toe and tandem gait are normal.   Motor Observation:    No asymmetry,  no atrophy, and no involuntary movements noted. Tone:    Normal muscle tone.    Posture:    Posture is normal. normal erect    Strength:    Strength is V/V in the upper and lower limbs.      Sensation: intact to LT     Reflex Exam:  DTR's:    Deep tendon reflexes in the upper and lower extremities are normal bilaterally.   Toes:    The toes are downgoing bilaterally.   Clonus:    Clonus is absent.    Assessment/Plan:  Patient with chronic migraines and remote lacunar stroke found on MRI  Stroke: MRI showed a remote lacunar infarct, patient back to discuss with fiance.  We saw patient for migraines, MRI of the brain showed a remote lacunar infarct at the left basal ganglia.  Unfortunately there is no telling how old this lacunar infarct is, could have even been in utero.  We reviewed the images together with her fianc, discussed ischemic strokes lacunar infarcts embolic type events and all the different types of cerebrovascular accidents that can happen.  Hers looks like lacunar.  Despite remote she needs a thorough evaluation including imaging of her blood vessels, a complete hypercoagulable profile, lipids, hemoglobin A1c, we have to mitigate every risk for stroke.  We discussed in detail answered all questions.  Addendum: Patient doing great now 4 migraines a month and < 10 total headache days a month. Order Roselyn Meier prn. Triptans contrindicated due to stroke but did try imitrex and maxalt in the past.  MRI brain 12/16/2022: Personally reviewed with patient and completely agree. IMPRESSION: 1. No acute intracranial abnormality. 2. Remote lacunar infarct at the anterior left basal ganglia,   CT Angiogram head and neck: No large vessel occlusion,hemodynamically significant stenosis, or other acute vascular abnormality. MRI: Lacunar Stroke, remote Baby aspirin every day Blood work: Science writer, diabetes, cholesterol etc come back at 8am one day fasting TCD: Trans Cranial  Doppler Echocardiogram with bubble study 30 day heart monitor Wegovy or Zepbound - or can send you to a weight loss center (Recommmend BMI <27-28)  Pick up emgality monthly - Preventative reduce freq of migraines (don;t forget copay card) When you have a migraine: Try Ubrelvy.  Please take one tablet at the onset of your headache. If it does not improve the symptoms please take one additional tablet 2 hours.  Cannot take anything ending in "Triptan" anymore stable.  Migraines Try one of the new preventatibe medications; Ajovy or Emgality. For prevention. Don;t et pregnant for 6 months until after stopping. Others in clude qulipta, nurtec. Acute/as needed:  Other new ones include ubrelvy and nurtec. TRIPTANS CONTRAINDICATED.   Orders Placed This Encounter  Procedures   Hemoglobin A1c   Lipid Panel   Sickle Cell Screen   Cardiolipin antibodies, IgG, IgM, IgA   Prothrombin gene mutation   Factor 5 leiden   Homocysteine   Beta-2-glycoprotein i abs, IgG/M/A   Lupus anticoagulant   Protein C activity   Protein C, total   Protein S activity   Protein S, total   Antithrombin III   CBC with Differential/Platelets   Comprehensive metabolic panel   Cardiac event monitor   ECHOCARDIOGRAM COMPLETE BUBBLE STUDY   VAS [place] TRANSCRANIAL DOPPLER W BUBBLES   Meds ordered this encounter  Medications   Ubrogepant (UBRELVY) 100 MG TABS    Sig: Take 1 tablet (100 mg total) by mouth every 2 (two) hours as needed. Maximum '200mg'$  a day.  Dispense:  4 tablet    Refill:  0    Cc: Luking, Elayne Snare, MD,  Kathyrn Drown, MD  Sarina Ill, MD  [facility] [address] [place], [place] [address]  Phone [phone] Fax [phone]   I spent over 45 minutes of face-to-face and non-face-to-face time with patient on the  1. Cerebrovascular accident (CVA) due to stenosis of left middle cerebral artery ([place])   2. Lacunar stroke ([place])   3. Hypercoagulable state  ([place])   4. Elevated glucose   5. Hyperlipidemia, unspecified hyperlipidemia type    diagnosis.  This included previsit chart review, lab review, study review, order entry, electronic health record documentation, patient education on the different diagnostic and therapeutic options, counseling and coordination of care, risks and benefits of management, compliance, or risk factor reduction

## 2022-12-28 NOTE — Patient Instructions (Addendum)
CT Angiogram head and neck Baby aspirin every day Blood work: Science writer, diabetes, cholesterol etc come back at 8am one day fasting TCD: Trans Cranial Doppler Echocardiogram with bubble study 30 day heart monitor Wegovy or Zepbound - or can send you to a weight loss center (Recommmend BMI <27-28)  Pick up emgality monthly - Preventative reduce freq of migraines (don;t forget copay card) When you have a migraine: Try Ubrelvy.  Please take one tablet at the onset of your headache. If it does not improve the symptoms please take one additional tablet 2 hours.  Cannot take anything ending in "Triptan" anymore   Ischemic Stroke  An ischemic stroke (cerebrovascular accident, CVA) occurs when an area of the brain does not get enough blood flow. This leads to the sudden death of brain tissue and can cause brain damage. An ischemic stroke is a medical emergency. It must be treated right away. What are the causes? This condition is caused by a decrease of blood flow to a part of the brain. This may be due to: A small blood clot (embolus). A buildup of plaque in the blood vessels (atherosclerosis). This blocks blood flow in the brain. An abnormal heart rhythm, called atrial fibrillation (AFib). This sends a small blood clot to the brain. A blocked or damaged artery in the head or neck. Certain infections. Inflammation of the arteries in the brain (vasculitis). Sometimes, the cause of ischemic stroke is not known. What increases the risk? The following medical conditions may increase your risk of a stroke: High blood pressure (hypertension). Heart disease. Diabetes. High cholesterol. Obesity. Sleep problems (sleep apnea). Other risk factors that you can change include: Using products that contain nicotine or tobacco. Not being active. Heavy use of alcohol or drugs, especially cocaine and methamphetamine. Taking birth control pills, especially if you also use tobacco. Risk factors that  you cannot change include: Being older than age 35. Having a history of blood clots, stroke, or mini-stroke (transient ischemic attack, TIA). Having a family history of stroke. What are the signs or symptoms? Symptoms of this condition usually develop suddenly, or you may notice them after waking from sleep. These may include: Weakness or numbness of your face, arm, or leg, especially on one side of your body. Loss of balance or coordination. Slurred speech, trouble speaking, trouble understanding speech, or a combination of these. Vision changes. You may have double vision, blurred vision, or loss of vision. Dizziness or confusion. Nausea and vomiting. Severe headache. If possible, write down the exact time your symptoms started. Tell your health care provider. If symptoms come and go, they could be signs of a TIA. Get help right away, even if you feel better. How is this diagnosed? This condition may be diagnosed based on: Your symptoms, your medical history, and a physical exam. CT scan of the brain. MRI. Imaging tests that scan blood flow in the brain (CT angiogram, MRI angiogram, or cerebral angiogram). You may also have other tests, including: Electrocardiogram (ECG). Continuous heart monitoring. Transthoracic echocardiogram (TTE). Transesophageal echocardiogram (TEE). Carotid ultrasound. Blood tests. Sleep study to check for sleep apnea. You may need to see a health care provider who specializes in stroke care. How is this treated? Treatment for this condition depends on the area of the brain affected and the cause of your symptoms. Some treatments work better if they are done within 3-6 hours of the start of symptoms. These may include: Medicine that is injected to dissolve the blood clot. Treatments given  directly to the affected artery to remove or dissolve the blood clot. Medicines to control blood pressure. Medicines to thin the blood (anticoagulant or  antiplatelet). Other treatments may include: Oxygen. IV fluids. Procedures to increase blood flow. After a stroke, you may work with physical, speech, mental health, or occupational therapists to help you recover. Follow these instructions at home: Medicines Take over-the-counter and prescription medicines only as told by your health care provider. If you were told to take a medicine to thin your blood, take it exactly as told, at the same time every day. Taking too much of a blood thinner can cause bleeding. Taking too little may not protect you against a stroke and other problems. If you were not prescribed medicines that contain aspirin or NSAIDs, such as ibuprofen, talk with your health care provider before you take any of these. These medicines increase your risk for dangerous bleeding. When taking a blood thinner, do these things: Hold pressure over any cuts that bleed for longer than usual. Tell your dentist and other health care providers that you are taking anticoagulants before having any procedures that may cause bleeding. Avoid activities that could cause injury or bruising. Wear a medical alert bracelet or carry a card that lists what medicines you take. Eating and drinking Follow instructions from your health care provider about diet. Eat healthy foods. If your stroke affected your ability to swallow, you may need to take steps to avoid choking. These may include: Taking small bites of food. Eating soft or pureed foods. Safety Follow instructions from your health care team about physical activity. Use a walker or cane as told by your health care provider. Take steps to lower your risk of falls at home. These may include: Installing grab bars in the bedroom and bathroom. Using raised toilets and putting a seat in the shower. Removing clutter and tripping hazards, such as cords or area rugs. General instructions Do not use any products that contain nicotine or tobacco.  These include cigarettes, chewing tobacco, and vaping devices, such as e-cigarettes. If you need help quitting, ask your health care provider. If you drink alcohol: Limit how much you have to: 0-1 drink a day for women who are not pregnant. 0-2 drinks a day for men. Know how much alcohol is in your drink. In the [place] one drink equals one 12 oz bottle of beer (355 mL), one 5 oz glass of wine (148 mL), or one 1 oz glass of hard liquor (44 mL). Keep all follow-up visits. This is important. How is this prevented? You can lower your risk of another stroke by managing these conditions: High blood pressure. High cholesterol. Diabetes. Heart disease. Sleep apnea. Obesity. Quitting smoking, limiting alcohol, and staying physically active also will reduce your risk. Your health care provider will continue to help you with ways to prevent short-term and long-term problems caused by stroke. Get help right away if: You have any symptoms of a stroke. "BE FAST" is an easy way to remember the main warning signs of a stroke: B - Balance. Signs are dizziness, sudden trouble walking, or loss of balance. E - Eyes. Signs are trouble seeing or a sudden change in vision. F - Face. Signs are sudden weakness or numbness of the face, or the face or eyelid drooping on one side. A - Arms. Signs are weakness or numbness in an arm. This happens suddenly and usually on one side of the body. S - Speech. Signs are sudden trouble speaking, slurred  speech, or trouble understanding what people say. T - Time. Time to call emergency services. Write down what time symptoms started. You have other signs of a stroke, such as: A sudden, severe headache with no known cause. Nausea or vomiting. Seizure. These symptoms may represent a serious problem that is an emergency. Do not wait to see if the symptoms will go away. Get medical help right away. Call your local emergency services (911 in the [place] Do not drive yourself to the  hospital. Summary An ischemic stroke (cerebrovascular accident, CVA) occurs when an area of the brain does not get enough blood flow. Symptoms of this condition usually develop suddenly, or you may notice them after waking from sleep. It is very important to get treatment at the first sign of stroke symptoms. Stroke is a medical emergency that must be treated right away. This information is not intended to replace advice given to you by your health care provider. Make sure you discuss any questions you have with your health care provider. Document Revised: 07/03/2020 Document Reviewed: 07/03/2020 Elsevier Patient Education  [place].

## 2022-12-29 ENCOUNTER — Encounter: Payer: Self-pay | Admitting: Neurology

## 2022-12-30 ENCOUNTER — Other Ambulatory Visit: Payer: Self-pay | Admitting: Neurology

## 2022-12-30 ENCOUNTER — Encounter: Payer: Self-pay | Admitting: Neurology

## 2022-12-30 DIAGNOSIS — I4891 Unspecified atrial fibrillation: Secondary | ICD-10-CM

## 2022-12-30 DIAGNOSIS — R7309 Other abnormal glucose: Secondary | ICD-10-CM

## 2022-12-30 DIAGNOSIS — I6381 Other cerebral infarction due to occlusion or stenosis of small artery: Secondary | ICD-10-CM

## 2022-12-30 DIAGNOSIS — E785 Hyperlipidemia, unspecified: Secondary | ICD-10-CM

## 2022-12-30 DIAGNOSIS — D6859 Other primary thrombophilia: Secondary | ICD-10-CM

## 2022-12-30 DIAGNOSIS — I63512 Cerebral infarction due to unspecified occlusion or stenosis of left middle cerebral artery: Secondary | ICD-10-CM

## 2023-01-05 ENCOUNTER — Encounter: Payer: Self-pay | Admitting: Neurology

## 2023-01-06 MED ORDER — UBRELVY 100 MG PO TABS: 100.0000 mg | ORAL_TABLET | ORAL | 11 refills | Status: DC | PRN

## 2023-01-06 NOTE — Addendum Note (Signed)
Addended by: Sarina Ill B on: 01/06/2023 05:28 PM   Modules accepted: Orders

## 2023-01-07 ENCOUNTER — Ambulatory Visit (HOSPITAL_BASED_OUTPATIENT_CLINIC_OR_DEPARTMENT_OTHER): Payer: Commercial Managed Care - PPO

## 2023-01-07 ENCOUNTER — Other Ambulatory Visit: Payer: Self-pay

## 2023-01-07 DIAGNOSIS — Z0289 Encounter for other administrative examinations: Secondary | ICD-10-CM

## 2023-01-07 DIAGNOSIS — I6381 Other cerebral infarction due to occlusion or stenosis of small artery: Secondary | ICD-10-CM | POA: Diagnosis not present

## 2023-01-07 DIAGNOSIS — I63512 Cerebral infarction due to unspecified occlusion or stenosis of left middle cerebral artery: Secondary | ICD-10-CM

## 2023-01-07 DIAGNOSIS — D6859 Other primary thrombophilia: Secondary | ICD-10-CM

## 2023-01-07 DIAGNOSIS — R7309 Other abnormal glucose: Secondary | ICD-10-CM

## 2023-01-07 DIAGNOSIS — E785 Hyperlipidemia, unspecified: Secondary | ICD-10-CM

## 2023-01-07 LAB — ECHOCARDIOGRAM COMPLETE BUBBLE STUDY
Area-P 1/2: 4.86 cm2
S' Lateral: 2.8 cm

## 2023-01-08 ENCOUNTER — Ambulatory Visit (HOSPITAL_COMMUNITY)
Admission: RE | Admit: 2023-01-08 | Discharge: 2023-01-08 | Disposition: A | Discharge status: Home / Self Care | Payer: Commercial Managed Care - PPO | Source: Ambulatory Visit | Attending: Neurology | Admitting: Neurology

## 2023-01-08 DIAGNOSIS — I6381 Other cerebral infarction due to occlusion or stenosis of small artery: Secondary | ICD-10-CM | POA: Diagnosis not present

## 2023-01-08 DIAGNOSIS — I63512 Cerebral infarction due to unspecified occlusion or stenosis of left middle cerebral artery: Secondary | ICD-10-CM | POA: Insufficient documentation

## 2023-01-08 NOTE — Progress Notes (Signed)
TCD Bubble study has been completed.   Preliminary results in [facility] Proc.   Melissa Livingston 01/08/2023 2:33 PM

## 2023-01-10 ENCOUNTER — Ambulatory Visit: Payer: Commercial Managed Care - PPO | Attending: Neurology

## 2023-01-10 DIAGNOSIS — I6381 Other cerebral infarction due to occlusion or stenosis of small artery: Secondary | ICD-10-CM | POA: Diagnosis not present

## 2023-01-10 DIAGNOSIS — I63512 Cerebral infarction due to unspecified occlusion or stenosis of left middle cerebral artery: Secondary | ICD-10-CM | POA: Diagnosis not present

## 2023-01-10 DIAGNOSIS — I4891 Unspecified atrial fibrillation: Secondary | ICD-10-CM | POA: Diagnosis not present

## 2023-01-11 DIAGNOSIS — I4891 Unspecified atrial fibrillation: Secondary | ICD-10-CM | POA: Diagnosis not present

## 2023-01-11 DIAGNOSIS — I6381 Other cerebral infarction due to occlusion or stenosis of small artery: Secondary | ICD-10-CM | POA: Diagnosis not present

## 2023-01-11 DIAGNOSIS — I63512 Cerebral infarction due to unspecified occlusion or stenosis of left middle cerebral artery: Secondary | ICD-10-CM | POA: Diagnosis not present

## 2023-01-19 LAB — CBC WITH DIFFERENTIAL/PLATELET
Basophils Absolute: 0 10*3/uL (ref 0.0–0.2)
Basos: 1 %
EOS (ABSOLUTE): 0.1 10*3/uL (ref 0.0–0.4)
Eos: 2 %
Hematocrit: 34.6 % (ref 34.0–46.6)
Hemoglobin: 10.9 g/dL — ABNORMAL LOW (ref 11.1–15.9)
Immature Grans (Abs): 0 10*3/uL (ref 0.0–0.1)
Immature Granulocytes: 0 %
Lymphocytes Absolute: 1.2 10*3/uL (ref 0.7–3.1)
Lymphs: 20 %
MCH: 23.7 pg — ABNORMAL LOW (ref 26.6–33.0)
MCHC: 31.5 g/dL (ref 31.5–35.7)
MCV: 75 fL — ABNORMAL LOW (ref 79–97)
Monocytes Absolute: 0.5 10*3/uL (ref 0.1–0.9)
Monocytes: 8 %
Neutrophils Absolute: 4.3 10*3/uL (ref 1.4–7.0)
Neutrophils: 69 %
Platelets: 340 10*3/uL (ref 150–450)
RBC: 4.59 x10E6/uL (ref 3.77–5.28)
RDW: 14.5 % (ref 11.7–15.4)
WBC: 6.2 10*3/uL (ref 3.4–10.8)

## 2023-01-19 LAB — COMPREHENSIVE METABOLIC PANEL
ALT: 9 IU/L (ref 0–32)
AST: 10 IU/L (ref 0–40)
Albumin/Globulin Ratio: 1.6 (ref 1.2–2.2)
Albumin: 4.2 g/dL (ref 4.0–5.0)
Alkaline Phosphatase: 81 IU/L (ref 44–121)
BUN/Creatinine Ratio: 10 (ref 9–23)
BUN: 7 mg/dL (ref 6–20)
Bilirubin Total: 0.4 mg/dL (ref 0.0–1.2)
CO2: 22 mmol/L (ref 20–29)
Calcium: 8.8 mg/dL (ref 8.7–10.2)
Chloride: 104 mmol/L (ref 96–106)
Creatinine, Ser: 0.71 mg/dL (ref 0.57–1.00)
Globulin, Total: 2.7 g/dL (ref 1.5–4.5)
Glucose: 84 mg/dL (ref 70–99)
Potassium: 4.4 mmol/L (ref 3.5–5.2)
Sodium: 140 mmol/L (ref 134–144)
Total Protein: 6.9 g/dL (ref 6.0–8.5)
eGFR: 119 mL/min/{1.73_m2} (ref >59)

## 2023-01-19 LAB — PROTEIN C ACTIVITY: Protein C Activity: 86 % (ref 73–180)

## 2023-01-19 LAB — PROTHROMBIN GENE MUTATION

## 2023-01-19 LAB — LIPID PANEL
Chol/HDL Ratio: 2.9 ratio (ref 0.0–4.4)
Cholesterol, Total: 175 mg/dL (ref 100–199)
HDL: 60 mg/dL (ref >39)
LDL Chol Calc (NIH): 104 mg/dL — ABNORMAL HIGH (ref 0–99)
Triglycerides: 59 mg/dL (ref 0–149)
VLDL Cholesterol Cal: 11 mg/dL (ref 5–40)

## 2023-01-19 LAB — HEMOGLOBIN A1C
Est. average glucose Bld gHb Est-mCnc: 117 mg/dL
Hgb A1c MFr Bld: 5.7 % — ABNORMAL HIGH (ref 4.8–5.6)

## 2023-01-19 LAB — LUPUS ANTICOAGULANT
Dilute Viper Venom Time: 32.1 s (ref 0.0–47.0)
PTT Lupus Anticoagulant: 43 s (ref 0.0–43.5)
Thrombin Time: 16.6 s (ref 0.0–23.0)
dPT Confirm Ratio: 0.96 Ratio (ref 0.00–1.34)
dPT: 39.5 s (ref 0.0–47.6)

## 2023-01-19 LAB — PROTEIN S ACTIVITY: Protein S Activity: 51 % — ABNORMAL LOW (ref 63–140)

## 2023-01-19 LAB — SICKLE CELL SCREEN: Sickle Cell Screen: NEGATIVE

## 2023-01-19 LAB — ANTITHROMBIN III: AntiThromb III Func: 94 % (ref 75–135)

## 2023-01-19 LAB — PROTEIN S, TOTAL: Protein S Ag, Total: 56 % — ABNORMAL LOW (ref 60–150)

## 2023-01-19 LAB — BETA-2-GLYCOPROTEIN I ABS, IGG/M/A
Beta-2 Glyco 1 IgA: <9 GPI IgA units (ref 0–25)
Beta-2 Glyco 1 IgM: <9 GPI IgM units (ref 0–32)
Beta-2 Glyco I IgG: <9 GPI IgG units (ref 0–20)

## 2023-01-19 LAB — FACTOR 5 LEIDEN

## 2023-01-19 LAB — HOMOCYSTEINE: Homocysteine: 10.9 umol/L (ref 0.0–14.5)

## 2023-01-19 LAB — PROTEIN C, TOTAL: Protein C Antigen: 84 % (ref 60–150)

## 2023-01-21 ENCOUNTER — Encounter: Payer: Self-pay | Admitting: Neurology

## 2023-01-21 ENCOUNTER — Telehealth: Payer: Self-pay | Admitting: Neurology

## 2023-01-21 ENCOUNTER — Telehealth: Payer: Self-pay | Admitting: Family Medicine

## 2023-01-21 DIAGNOSIS — D6859 Other primary thrombophilia: Secondary | ICD-10-CM

## 2023-01-21 DIAGNOSIS — Z79899 Other long term (current) drug therapy: Secondary | ICD-10-CM

## 2023-01-21 DIAGNOSIS — E8809 Other disorders of plasma-protein metabolism, not elsewhere classified: Secondary | ICD-10-CM

## 2023-01-21 DIAGNOSIS — E785 Hyperlipidemia, unspecified: Secondary | ICD-10-CM

## 2023-01-21 MED ORDER — ATORVASTATIN CALCIUM 10 MG PO TABS: 10.0000 mg | ORAL_TABLET | Freq: Every day | ORAL | 3 refills | Status: DC

## 2023-01-21 NOTE — Addendum Note (Signed)
Addended by: Gildardo Griffes on: 01/21/2023 01:39 PM   Modules accepted: Orders

## 2023-01-21 NOTE — Telephone Encounter (Signed)
I spoke with the patient and discussed all points below by Dr Jaynee Eagles regarding pt's lab results. The patient verbalized understanding. She will come back on 4/15 at 9 am for Protein S recheck. Patient was advised to continue Aspirin 81 mg daily. She is amenable to trying Lipitor 10 mg PO daily but she did ask if Dr Jaynee Eagles had any idea how long she may be on this, I.e. would it be for life. Patient verbalized appreciation for the call. I told her I would send a summary message to her mychart. She was appreciative.

## 2023-01-21 NOTE — Telephone Encounter (Signed)
Nurses Please  Touch base with patient Let her know that neurology sent [place] information regarding the workup they have done including scans and lab work Based upon criteria it is recommended to be on cholesterol medicine to lower LDL below 70. We can go ahead and start low-dose statin and do a follow-up office visit to discuss further or if patient is hesitant to start low-dose statin we can schedule follow-up office visit within 2 to 3 weeks to discuss all of these findings and the importance of getting cholesterol under control.  Please see what the patient would like to do  If she would like to start low-dose statin we can do so and do a follow-up visit in several weeks thank you let me know

## 2023-01-21 NOTE — Telephone Encounter (Signed)
Hi to all I had sent a message earlier today to Dr. Jaynee Eagles.  This (lipid risk factor control) will be an area that we can manage.  Given that this area will require follow-up labs in 6 to 8 weeks and potential manipulation of medication it makes sense for primary care to follow this area.  It is fine that the medication was sent in.  I believe that the message sent to Dr. Lavell Anchors did not filter to the other staff within neurology.  I certainly appreciate everybody's help please see below  Our staff will call patient to let her know that we will follow the lipid related issue within our office.  Also we will place  order in epic for her to do a lipid liver profile in approximately 6 to 8 weeks.  We will also inform the patient to do the lab work in 6 to 8 weeks after starting the statin. Then we will have her do a follow-up visit with [place] regarding this particular issue.  Best for her to go ahead and schedule a follow-up office visit in approximately 10 weeks. She will continue to do any necessary labs and follow-up with neurology as well but moving forward I think it would be best for primary care to manage her hyperlipidemia and get her LDL below 70. Our office will call the patient and let her know about the lab work, the follow-up visit, and how in her situation being on statin long-term would be the best approach but we can certainly discuss it in greater detail when the patient does her follow-up visit.  If the patient would like to have a follow-up visit sooner we can accommodate that as well. Thanks-Dr. Sallee Lange (feel free to send a response if any questions concerns or issues thank you)

## 2023-01-21 NOTE — Telephone Encounter (Signed)
Cc Dr Wolfgang Phoenix, if you have any comments or changes let us know thanks  Pod 4: Please call and tell the following to patient: 1. She has anemia, mild, iron deficient, not new 2. Her cholesterol is slightly elevated. I would start a statin to get it down to <70 since we saw what we think is a stroke on MRI (could be a congenital anomaly, we can't say for sure so should be careful). If she agrees I can order one thanks let me know. Lipitor 49m 90 days with a year of refills 3. The protein S total and activity slightly decreased I would have her come back in 2 months to repeat, please order if she agrees. Protein S is a protein involved in clotting and people with this deficiency can have venous clots like DVTs/PE which she does not have a history so I would repeat in 2 months.  4. She Is pre-diabetic 5.7 monitor with primary care 5. Everything else was normal/negative. See phone note. Cc pcp 6. Continue asa [id number]

## 2023-01-22 NOTE — Telephone Encounter (Signed)
Patient advised of provider's recommendations and verbalized understanding. Blood work was ordered in [facility] and patient scheduled follow up office visit in 10 weeks with Dr Nicki Reaper.

## 2023-01-22 NOTE — Addendum Note (Signed)
Addended by: Dairl Ponder on: 01/22/2023 04:08 PM   Modules accepted: Orders

## 2023-01-22 NOTE — Telephone Encounter (Signed)
See other message -(from neurology responded to by Dr Nicki Reaper)

## 2023-01-22 NOTE — Telephone Encounter (Signed)
Left message to return call (Blood work ordered in [facility])

## 2023-01-25 ENCOUNTER — Encounter: Payer: Self-pay | Admitting: *Deleted

## 2023-01-25 ENCOUNTER — Ambulatory Visit: Payer: Commercial Managed Care - PPO | Admitting: Neurology

## 2023-01-27 NOTE — Telephone Encounter (Signed)
FMLA form completed signed and given to medical records for processing.

## 2023-02-02 ENCOUNTER — Other Ambulatory Visit (HOSPITAL_COMMUNITY): Payer: Self-pay

## 2023-02-03 ENCOUNTER — Other Ambulatory Visit (HOSPITAL_COMMUNITY): Payer: Self-pay

## 2023-02-03 MED ORDER — RIZATRIPTAN BENZOATE 10 MG PO TBDP: 10.0000 mg | ORAL_TABLET | Freq: Every day | ORAL | 9 refills | Status: DC | PRN

## 2023-02-03 MED ORDER — EMGALITY 120 MG/ML ~~LOC~~ SOAJ
120.0000 mg | SUBCUTANEOUS | 10 refills | Status: DC
  Filled 2023-02-03: qty 1, 30d supply, fill #0

## 2023-02-03 MED ORDER — ATORVASTATIN CALCIUM 10 MG PO TABS
10.0000 mg | ORAL_TABLET | Freq: Every day | ORAL | 10 refills | Status: DC
  Filled 2023-02-03: qty 30, 30d supply, fill #0

## 2023-02-03 MED ORDER — ONDANSETRON HCL 4 MG PO TABS
4.0000 mg | ORAL_TABLET | Freq: Three times a day (TID) | ORAL | 0 refills | Status: DC
  Filled 2023-06-22: qty 20, 7d supply, fill #0

## 2023-02-03 MED ORDER — RIZATRIPTAN BENZOATE 10 MG PO TBDP: 10.0000 mg | ORAL_TABLET | ORAL | 10 refills | Status: DC | PRN

## 2023-02-04 ENCOUNTER — Encounter: Payer: Self-pay | Admitting: Radiology

## 2023-02-04 ENCOUNTER — Other Ambulatory Visit (HOSPITAL_COMMUNITY): Payer: Self-pay

## 2023-02-05 ENCOUNTER — Other Ambulatory Visit (HOSPITAL_COMMUNITY): Payer: Self-pay

## 2023-03-10 ENCOUNTER — Telehealth: Payer: Commercial Managed Care - PPO | Admitting: Nurse Practitioner

## 2023-03-10 DIAGNOSIS — R051 Acute cough: Secondary | ICD-10-CM

## 2023-03-10 DIAGNOSIS — J309 Allergic rhinitis, unspecified: Secondary | ICD-10-CM

## 2023-03-10 MED ORDER — ALBUTEROL SULFATE HFA 108 (90 BASE) MCG/ACT IN AERS: 2.0000 | INHALATION_SPRAY | Freq: Four times a day (QID) | RESPIRATORY_TRACT | 0 refills | Status: DC | PRN

## 2023-03-10 MED ORDER — CETIRIZINE HCL 10 MG PO TABS: 10.0000 mg | ORAL_TABLET | Freq: Every day | ORAL | 2 refills | Status: DC

## 2023-03-10 MED ORDER — FLUTICASONE PROPIONATE 50 MCG/ACT NA SUSP: 2.0000 | Freq: Every day | NASAL | 6 refills | Status: AC

## 2023-03-10 MED ORDER — OLOPATADINE HCL 0.1 % OP SOLN: 1.0000 [drp] | Freq: Two times a day (BID) | OPHTHALMIC | 12 refills | Status: AC

## 2023-03-10 MED ORDER — BENZONATATE 100 MG PO CAPS
100.0000 mg | ORAL_CAPSULE | Freq: Three times a day (TID) | ORAL | 0 refills | Status: DC | PRN
Start: 2023-03-10 — End: 2023-04-26

## 2023-03-10 NOTE — Progress Notes (Signed)
E-Visit for Cough  We are sorry that you are not feeling well.  Here is how we plan to help!  Based on your presentation I believe you most likely have A cough due to allergies.  I recommend that you start the an over-the counter-allergy medication such Zyrtec 10 mg daily and Flonase    In addition you may use A prescription cough medication called Tessalon Perles 100mg . You may take 1-2 capsules every 8 hours as needed for your cough.  We will refill your Zyrtec and allergy eye drops along with your Albuterol inhaler  Meds ordered this encounter  Medications   cetirizine (ZYRTEC ALLERGY) 10 MG tablet    Sig: Take 1 tablet (10 mg total) by mouth daily.    Dispense:  30 tablet    Refill:  2   fluticasone (FLONASE) 50 MCG/ACT nasal spray    Sig: Place 2 sprays into both nostrils daily.    Dispense:  16 g    Refill:  6   olopatadine (PATANOL) 0.1 % ophthalmic solution    Sig: Place 1 drop into both eyes 2 (two) times daily.    Dispense:  5 mL    Refill:  12   albuterol (VENTOLIN HFA) 108 (90 Base) MCG/ACT inhaler    Sig: Inhale 2 puffs into the lungs every 6 (six) hours as needed for wheezing or shortness of breath.    Dispense:  8 g    Refill:  0   benzonatate (TESSALON) 100 MG capsule    Sig: Take 1 capsule (100 mg total) by mouth 3 (three) times daily as needed.    Dispense:  30 capsule    Refill:  0     From your responses in the eVisit questionnaire you describe inflammation in the upper respiratory tract which is causing a significant cough.  This is commonly called Bronchitis and has four common causes:   Allergies Viral Infections Acid Reflux Bacterial Infection Allergies, viruses and acid reflux are treated by controlling symptoms or eliminating the cause. An example might be a cough caused by taking certain blood pressure medications. You stop the cough by changing the medication. Another example might be a cough caused by acid reflux. Controlling the reflux helps control  the cough.  USE OF BRONCHODILATOR ("RESCUE") INHALERS: There is a risk from using your bronchodilator too frequently.  The risk is that over-reliance on a medication which only relaxes the muscles surrounding the breathing tubes can reduce the effectiveness of medications prescribed to reduce swelling and congestion of the tubes themselves.  Although you feel brief relief from the bronchodilator inhaler, your asthma may actually be worsening with the tubes becoming more swollen and filled with mucus.  This can delay other crucial treatments, such as oral steroid medications. If you need to use a bronchodilator inhaler daily, several times per day, you should discuss this with your provider.  There are probably better treatments that could be used to keep your asthma under control.     HOME CARE Only take medications as instructed by your medical team. Complete the entire course of an antibiotic. Drink plenty of fluids and get plenty of rest. Avoid close contacts especially the very young and the elderly Cover your mouth if you cough or cough into your sleeve. Always remember to wash your hands A steam or ultrasonic humidifier can help congestion.   GET HELP RIGHT AWAY IF: You develop worsening fever. You become short of breath You cough up blood. Your symptoms  persist after you have completed your treatment plan MAKE SURE YOU  Understand these instructions. Will watch your condition. Will get help right away if you are not doing well or get worse.    Thank you for choosing an e-visit.  Your e-visit answers were reviewed by a board certified advanced clinical practitioner to complete your personal care plan. Depending upon the condition, your plan could have included both over the counter or prescription medications.  Please review your pharmacy choice. Make sure the pharmacy is open so you can pick up prescription now. If there is a problem, you may contact your provider through [employer] and have the prescription routed to another pharmacy.  Your safety is important to [place]. If you have drug allergies check your prescription carefully.   For the next 24 hours you can use MyChart to ask questions about today's visit, request a non-urgent call back, or ask for a work or school excuse. You will get an email in the next two days asking about your experience. I hope that your e-visit has been valuable and will speed your recovery.   I spent approximately 5 minutes reviewing the patient's history, current symptoms and coordinating their care today.

## 2023-03-22 ENCOUNTER — Other Ambulatory Visit (HOSPITAL_COMMUNITY): Payer: Self-pay

## 2023-03-22 ENCOUNTER — Other Ambulatory Visit (INDEPENDENT_AMBULATORY_CARE_PROVIDER_SITE_OTHER): Payer: Self-pay

## 2023-03-22 ENCOUNTER — Encounter (HOSPITAL_COMMUNITY): Payer: Self-pay

## 2023-03-22 ENCOUNTER — Other Ambulatory Visit: Payer: Self-pay

## 2023-03-22 DIAGNOSIS — Z0289 Encounter for other administrative examinations: Secondary | ICD-10-CM

## 2023-03-22 DIAGNOSIS — E8809 Other disorders of plasma-protein metabolism, not elsewhere classified: Secondary | ICD-10-CM

## 2023-03-22 DIAGNOSIS — D6859 Other primary thrombophilia: Secondary | ICD-10-CM

## 2023-03-24 LAB — PROTEIN S, TOTAL: Protein S Ag, Total: 73 % (ref 60–150)

## 2023-03-24 LAB — PROTEIN S ACTIVITY: Protein S Activity: 78 % (ref 63–140)

## 2023-03-25 ENCOUNTER — Other Ambulatory Visit: Payer: Self-pay

## 2023-03-30 ENCOUNTER — Other Ambulatory Visit (HOSPITAL_COMMUNITY): Payer: Self-pay

## 2023-04-01 DIAGNOSIS — E785 Hyperlipidemia, unspecified: Secondary | ICD-10-CM | POA: Diagnosis not present

## 2023-04-01 DIAGNOSIS — Z79899 Other long term (current) drug therapy: Secondary | ICD-10-CM | POA: Diagnosis not present

## 2023-04-02 ENCOUNTER — Ambulatory Visit: Payer: Commercial Managed Care - PPO | Admitting: Family Medicine

## 2023-04-02 LAB — HEPATIC FUNCTION PANEL
ALT: 9 IU/L (ref 0–32)
AST: 11 IU/L (ref 0–40)
Albumin: 4.1 g/dL (ref 4.0–5.0)
Alkaline Phosphatase: 90 IU/L (ref 44–121)
Bilirubin Total: 0.6 mg/dL (ref 0.0–1.2)
Bilirubin, Direct: 0.14 mg/dL (ref 0.00–0.40)
Total Protein: 6.7 g/dL (ref 6.0–8.5)

## 2023-04-02 LAB — LIPID PANEL
Chol/HDL Ratio: 3.4 ratio (ref 0.0–4.4)
Cholesterol, Total: 158 mg/dL (ref 100–199)
HDL: 46 mg/dL (ref >39)
LDL Chol Calc (NIH): 97 mg/dL (ref 0–99)
Triglycerides: 81 mg/dL (ref 0–149)
VLDL Cholesterol Cal: 15 mg/dL (ref 5–40)

## 2023-04-06 ENCOUNTER — Other Ambulatory Visit (HOSPITAL_COMMUNITY): Payer: Self-pay

## 2023-04-06 ENCOUNTER — Ambulatory Visit (INDEPENDENT_AMBULATORY_CARE_PROVIDER_SITE_OTHER): Payer: Commercial Managed Care - PPO | Admitting: Family Medicine

## 2023-04-06 DIAGNOSIS — Z79899 Other long term (current) drug therapy: Secondary | ICD-10-CM

## 2023-04-06 DIAGNOSIS — Z8673 Personal history of transient ischemic attack (TIA), and cerebral infarction without residual deficits: Secondary | ICD-10-CM

## 2023-04-06 DIAGNOSIS — R7303 Prediabetes: Secondary | ICD-10-CM

## 2023-04-06 DIAGNOSIS — E785 Hyperlipidemia, unspecified: Secondary | ICD-10-CM | POA: Diagnosis not present

## 2023-04-06 DIAGNOSIS — J309 Allergic rhinitis, unspecified: Secondary | ICD-10-CM

## 2023-04-06 MED ORDER — ATORVASTATIN CALCIUM 10 MG PO TABS
10.0000 mg | ORAL_TABLET | Freq: Every day | ORAL | 3 refills | Status: DC
Start: 2023-04-06 — End: 2023-05-09
  Filled 2023-04-06 – 2023-04-16 (×2): qty 90, 90d supply, fill #0

## 2023-04-06 MED ORDER — AZELASTINE HCL 0.1 % NA SOLN
2.0000 | Freq: Two times a day (BID) | NASAL | 12 refills | Status: DC
  Filled 2023-04-06 – 2023-06-22 (×2): qty 30, 25d supply, fill #0

## 2023-04-06 NOTE — Progress Notes (Unsigned)
Subjective:    Patient ID: Melissa Livingston, female    DOB: 04/26/1996, 27 y.o.   MRN: [id number]  HPI  Patient arrives follow up for lab work.   Patient would like to discuss allergies and weight management. Outpatient Encounter Medications as of 04/06/2023  Medication Sig   acetaminophen (TYLENOL) 500 MG tablet Take 2 tablets (1,000 mg total) by mouth every 6 (six) hours as needed for mild pain, moderate pain or headache.   albuterol (VENTOLIN HFA) 108 (90 Base) MCG/ACT inhaler Inhale 2 puffs into the lungs every 6 (six) hours as needed for wheezing or shortness of breath.   aspirin EC 81 MG tablet Take 81 mg by mouth daily. Swallow whole.   azelastine (ASTELIN) 0.1 % nasal spray Place 2 sprays into both nostrils 2 (two) times daily.   cetirizine (ZYRTEC ALLERGY) 10 MG tablet Take 1 tablet (10 mg total) by mouth daily.   etonogestrel (NEXPLANON) 68 MG IMPL implant 1 each by Subdermal route once.   fluticasone (FLONASE) 50 MCG/ACT nasal spray Place 2 sprays into both nostrils daily.   Galcanezumab-gnlm (EMGALITY) 120 MG/ML SOAJ Inject 1 Pen into the skin every 30 (thirty) days.   olopatadine (PATANOL) 0.1 % ophthalmic solution Place 1 drop into both eyes 2 (two) times daily.   ondansetron (ZOFRAN) 4 MG tablet Take 1 tablet (4 mg total) by mouth every 8 (eight) hours as needed for nausea or vomiting.   ondansetron (ZOFRAN-ODT) 4 MG disintegrating tablet Take 1-2 tablets (4-8 mg total) by mouth every 8 (eight) hours as needed.   tiZANidine (ZANAFLEX) 2 MG tablet Take 1 tablet (2 mg total) by mouth every 6 (six) hours as needed for muscle spasms.   traZODone (DESYREL) 50 MG tablet Take 0.5-1 tablets (25-50 mg total) by mouth at bedtime as needed for sleep.   Ubrogepant (UBRELVY) 100 MG TABS Take 1 tablet (100 mg total) by mouth every 2 (two) hours as needed. Maximum 200mg  a day.   Ubrogepant (UBRELVY) 100 MG TABS Take 1 tablet (100 mg total) by mouth every 2 (two) hours as needed. Maximum 200mg  a  day.   [DISCONTINUED] atorvastatin (LIPITOR) 10 MG tablet Take 1 tablet (10 mg total) by mouth daily.   [DISCONTINUED] atorvastatin (LIPITOR) 10 MG tablet Take 1 tablet (10 mg total) by mouth daily.   atorvastatin (LIPITOR) 10 MG tablet Take 1 tablet (10 mg total) by mouth daily.   benzonatate (TESSALON) 100 MG capsule Take 1 capsule (100 mg total) by mouth 3 (three) times daily as needed. (Patient not taking: Reported on 04/06/2023)   naproxen (NAPROSYN) 500 MG tablet Take 1 tablet (500 mg total) by mouth 2 (two) times daily. (Patient not taking: Reported on 04/06/2023)   rizatriptan (MAXALT-MLT) 10 MG disintegrating tablet Take 1 tablet (10 mg total) by mouth as needed. May repeat in two hours if needed. (Patient not taking: Reported on 04/06/2023)   rizatriptan (MAXALT-MLT) 10 MG disintegrating tablet Take 1 tablet (10 mg total) by mouth as needed for migraine. May repeat in 2 hours if needed. (Patient not taking: Reported on 04/06/2023)   [DISCONTINUED] Rimegepant Sulfate (NURTEC) 75 MG TBDP Take 75 mg by mouth every other day. (Patient not taking: Reported on 04/06/2023)   No facility-administered encounter medications on file as of 04/06/2023.   Morbid obesity (HCC)  History of completed stroke  Hyperlipidemia, unspecified hyperlipidemia type - Plan: atorvastatin (LIPITOR) 10 MG tablet, Lipid panel  Prediabetes - Plan: Hemoglobin A1c  Encounter for long-term (current)  use of high-risk medication - Plan: Hepatic Function Panel  Allergic rhinitis, unspecified seasonality, unspecified trigger  Patient deals with morbid obesity she has tried phenteramine which helped her bring her weight down but unfortunately did not keep it down obviously once coming off phenteramine weight tends to gravitate back up.  She does try to eat relatively healthy tries to fit in some exercise but she is apparently young children as well as full-time work She does have prediabetes she knows the importance of  minimizing starches in the diet She also has allergic rhinitis with runny nose sneezing coughing no wheezing or difficulty breathing She does have hyperlipidemia for which she does take medication she would like to come off the medicine but I reminded her that she did have a small stroke and that there is risk of further strokes being off of statins We also discussed her migraines for which she is following up with the specialist  Review of Systems     Objective:   Physical Exam General-in no acute distress Eyes-no discharge Lungs-respiratory rate normal, CTA CV-no murmurs,RRR Extremities skin warm dry no edema Neuro grossly normal Behavior normal, alert        Assessment & Plan:  1. Morbid obesity (HCC) Portion control regular physical activity would be a good candidate for GLP-1 but medicine not covered Gastric bypass seemingly a good solution but patient wants to have 1 more child recommend to wait till after pregnancy  2. History of completed stroke Very important to get keep LDL below 70.  3. Hyperlipidemia, unspecified hyperlipidemia type Healthy diet continue current medication check lab work before next follow-up visit - atorvastatin (LIPITOR) 10 MG tablet; Take 1 tablet (10 mg total) by mouth daily.  Dispense: 90 tablet; Refill: 3 - Lipid panel  4. Prediabetes Minimize starches in diet stay physically active check A1c before next visit - Hemoglobin A1c  5. Encounter for long-term (current) use of high-risk medication Before next visit - Hepatic Function Panel  6. Allergic rhinitis, unspecified seasonality, unspecified trigger Add Astelin  30 minutes spent between documentation and discussion with patient regarding morbid obesity and options we will print out for her some additional dietary measures to follow  Follow-up in the fall time

## 2023-04-14 ENCOUNTER — Other Ambulatory Visit (HOSPITAL_COMMUNITY): Payer: Self-pay

## 2023-04-16 ENCOUNTER — Other Ambulatory Visit (HOSPITAL_COMMUNITY): Payer: Self-pay

## 2023-04-16 ENCOUNTER — Other Ambulatory Visit: Payer: Self-pay

## 2023-04-19 ENCOUNTER — Other Ambulatory Visit: Payer: Self-pay

## 2023-04-19 ENCOUNTER — Other Ambulatory Visit (HOSPITAL_COMMUNITY): Payer: Self-pay

## 2023-04-26 ENCOUNTER — Telehealth (INDEPENDENT_AMBULATORY_CARE_PROVIDER_SITE_OTHER): Payer: Commercial Managed Care - PPO | Admitting: Neurology

## 2023-04-26 ENCOUNTER — Telehealth: Payer: Self-pay | Admitting: Neurology

## 2023-04-26 DIAGNOSIS — I63512 Cerebral infarction due to unspecified occlusion or stenosis of left middle cerebral artery: Secondary | ICD-10-CM | POA: Diagnosis not present

## 2023-04-26 DIAGNOSIS — G43709 Chronic migraine without aura, not intractable, without status migrainosus: Secondary | ICD-10-CM | POA: Diagnosis not present

## 2023-04-26 NOTE — Patient Instructions (Signed)
Eptinezumab Injection What is this medication? EPTINEZUMAB (EP ti NEZ ue mab) prevents migraines. It works by blocking a substance in the body that causes migraines. It is a monoclonal antibody. This medicine may be used for other purposes; ask your health care provider or pharmacist if you have questions. COMMON BRAND NAME(S): Vyepti What should I tell my care team before I take this medication? They need to know if you have any of these conditions: An unusual or allergic reaction to eptinezumab, other medications, foods, dyes, or preservatives Pregnant or trying to get pregnant Breast-feeding How should I use this medication? This medication is injected into a vein. It is given by your care team in a hospital or clinic setting. Talk to your care team about the use of this medication in children. Special care may be needed. Overdosage: If you think you have taken too much of this medicine contact a poison control center or emergency room at once. NOTE: This medicine is only for you. Do not share this medicine with others. What if I miss a dose? Keep appointments for follow-up doses. It is important not to miss your dose. Call your care team if you are unable to keep an appointment. What may interact with this medication? Interactions are not expected. This list may not describe all possible interactions. Give your health care provider a list of all the medicines, herbs, non-prescription drugs, or dietary supplements you use. Also tell them if you smoke, drink alcohol, or use illegal drugs. Some items may interact with your medicine. What should I watch for while using this medication? Your condition will be monitored carefully while you are receiving this medication. What side effects may I notice from receiving this medication? Side effects that you should report to your care team as soon as possible: Allergic reactions or angioedema--skin rash, itching or hives, swelling of the face, eyes,  lips, tongue, arms, or legs, trouble swallowing or breathing Side effects that usually do not require medical attention (report to your care team if they continue or are bothersome): Runny or stuffy nose This list may not describe all possible side effects. Call your doctor for medical advice about side effects. You may report side effects to FDA at 1-800-FDA-1088. Where should I keep my medication? This medication is given in a hospital or clinic. It will not be stored at home. NOTE: This sheet is a summary. It may not cover all possible information. If you have questions about this medicine, talk to your doctor, pharmacist, or health care provider.  2023 Elsevier/Gold Standard (2022-01-19 00:00:00)  

## 2023-04-26 NOTE — Telephone Encounter (Signed)
Sent mychart msg informing pt of follow up made with Sarah 

## 2023-04-26 NOTE — Telephone Encounter (Signed)
Can you schedule her with a follow up with megan or sarah in 6 months please it can be video thanks for migraines

## 2023-04-26 NOTE — Progress Notes (Signed)
[facility]    Provider:  Dr Lucia Gaskins Requesting Provider: Babs Sciara, MD Primary Care Provider:  Babs Sciara, MD  Virtual Visit via Video Note  I connected with [place] on 04/26/23 at  2:00 PM EDT by a video enabled telemedicine application and verified that I am speaking with the correct person using two identifiers.  Location: Patient: home Provider: office   I discussed the limitations of evaluation and management by telemedicine and the availability of in person appointments. The patient expressed understanding and agreed to proceed.  Follow Up Instructions:    I discussed the assessment and treatment plan with the patient. The patient was provided an opportunity to ask questions and all were answered. The patient agreed with the plan and demonstrated an understanding of the instructions.   The patient was advised to call back or seek an in-person evaluation if the symptoms worsen or if the condition fails to improve as anticipated.  I provided  over 30 minutes of non-face-to-face time during this encounter.   Anson Fret, MD   CC:  migraines  04/26/2023: Patient having 8 migraine days a month and > 15 total headache days. Uses ubrelvy. Ajovy and emgality helped about 50%. Still a significant burden of migraies we can try Vyepti.   Patient complains of symptoms per HPI as well as the following symptoms: migraine . Pertinent negatives and positives per HPI. All others negative    12/29/2022: MRI showed a remote lacunar infarct, patient back to discuss with fiance.  We saw patient for migraines, MRI of the brain showed a remote lacunar infarct at the left basal ganglia.  Unfortunately there is no telling how old this lacunar infarct is, could have even been in utero.  We reviewed the images together with her fianc, discussed ischemic strokes lacunar infarcts embolic type events and all the different types of cerebrovascular accidents that  can happen.  Hers looks like lacunar.  Despite remote she needs a thorough evaluation including imaging of her blood vessels, a complete hypercoagulable profile, lipids, hemoglobin A1c, we have to mitigate every risk for stroke.  We discussed in detail answered all questions.  MRI brain 12/16/2022: Personally reviewed with patient and completely agree. IMPRESSION: 1. No acute intracranial abnormality. 2. 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   CTA H&N: IMPRESSION: 1. Normal CTA of the head and neck. No large vessel occlusion, hemodynamically significant stenosis, or other acute vascular abnormality. 2. No other acute intracranial abnormality. 3. Remote left basal gangliar lacunar infarct.  Patient complains of symptoms per HPI as well as the following symptoms: stroke . Pertinent negatives and positives per HPI. All others negative   HPI:  [name] SONIKA GOSLIN is a 27 y.o. female here as requested by Babs Sciara, MD for migraines. PMHx HTN, asthma, IBS,gerd, left sciatica, weight gain, obesity, headache, constipation, anxiety with depression. She started with migraines since the age of 40. They severe enough she can;t get out of work. They start in the back of front, pulsating/pounding.throbbing, in the temples, when she laughs a lof the back of the head will hurt, occ she gets blurry, hurts in there neck with valsalva or pushing down, morning headaches and nocturnal headaches worse supine. Pulsating/pounding/throbbing, nausa, light sensitivity, daull headache almost every day and >14 migraine days a month, she had side effects to the topiramate couldn;t tolerate a higher dose, they can be moreate to severe and on average lasts 48 hours, she  has a lot neck pain and radiates into the neck. Nothing makes it better. Trggers are bright lights. Cetain smells can triggers. For over year. No aura. No medication overuse, No other focal neurologic deficits, associated symptoms,  inciting events or modifiable factors.   Reviewed notes, labs and imaging from outside physicians, which showed:   01/09/2019: CT cervical spine  EXAM: CT CERVICAL SPINE WITHOUT CONTRAST   TECHNIQUE: Multidetector CT imaging of the cervical spine was performed without intravenous contrast. Multiplanar CT image reconstructions were also generated.   COMPARISON:  None.   FINDINGS: Alignment: There is reversal of the usual cervical lordosis. This is nonspecific and can be due to patient positioning but ligamentous injury or muscle spasm could also have this appearance and are not excluded. MRI is more sensitive for evaluation of ligamentous injury if clinically indicated. No anterior subluxation. Normal alignment of the facet joints. C1-2 articulation appears intact.   Skull base and vertebrae: No vertebral compression deformities. No focal bone lesion or bone destruction. Bone cortex appears intact.   Soft tissues and spinal canal: No paraspinal soft tissue mass or infiltration. No prevertebral soft tissue swelling.   Disc levels:  Intervertebral disc space heights are preserved.   Upper chest: Lung apices are clear.   Other: None.   IMPRESSION: Nonspecific reversal of the usual cervical lordosis. This could be positional or may indicate ligamentous injury. MRI would be more sensitive for evaluation of ligamentous injury if clinically indicated. No acute displaced fractures identified.  From a thorough review of records, meds treid that can be used in migraine managemen tinclude: tylenol, aspirin, fioticet, celexa, flexeril, propranolol contraindicated due to asthma, voltaren, benadryl, lexapro, gabapentin, labetalol, ibuprofen, ketorolac, magnesium, mobic, solumedrol, reglan, naproxen, nortriptyline, zofran, oxy, phenergen, imitrex, tizanidine, topamax  Review of Systems: Patient complains of symptoms per HPI as well as the following symptoms migraines. Pertinent negatives  and positives per HPI. All others negative.   Social History   Socioeconomic History   Marital status: Significant Other    Spouse name: Not on file   Number of children: 2   Years of education: Not on file   Highest education level: Some college, no degree  Occupational History   Occupation: out due to knee  Tobacco Use   Smoking status: Never   Smokeless tobacco: Never  Vaping Use   Vaping Use: Never used  Substance and Sexual Activity   Alcohol use: No    Alcohol/week: 0.0 standard drinks of alcohol   Drug use: No   Sexual activity: Yes    Birth control/protection: Implant  Other Topics Concern   Not on file  Social History Narrative   Not on file   Social Determinants of Health   Financial Resource Strain: Medium Risk (07/28/2022)   Overall Financial Resource Strain (CARDIA)    Difficulty of Paying Living Expenses: Somewhat hard  Food Insecurity: No Food Insecurity (07/28/2022)   Hunger Vital Sign    Worried About Running Out of Food in the Last Year: Never true    Ran Out of Food in the Last Year: Never true  Transportation Needs: No Transportation Needs (07/28/2022)   PRAPARE - Transportation    Lack of Transportation (Medical): No    Lack of Transportation (Non-Medical): No  Physical Activity: Insufficiently Active (07/28/2022)   Exercise Vital Sign    Days of Exercise per Week: 3 days    Minutes of Exercise per Session: 40 min  Stress: Stress Concern Present (07/28/2022)   [employer]  of Occupational Health - Occupational Stress Questionnaire    Feeling of Stress : Very much  Social Connections: Moderately Isolated (07/28/2022)   Social Connection and Isolation Panel [NHANES]    Frequency of Communication with Friends and Family: More than three times a week    Frequency of Social Gatherings with Friends and Family: More than three times a week    Attends Religious Services: More than 4 times per year    Active Member of Clubs or Organizations: No     Attends Banker Meetings: Never    Marital Status: Never married  Intimate Partner Violence: Not At Risk (07/28/2022)   Humiliation, Afraid, Rape, and Kick questionnaire    Fear of Current or Ex-Partner: No    Emotionally Abused: No    Physically Abused: No    Sexually Abused: No    Family History  Problem Relation Age of Onset   Multiple sclerosis Mother    Migraines Mother    Hypertension Father    Hypertension Paternal Aunt    Heart disease Paternal Uncle    Diabetes Paternal Uncle    Hypertension Paternal Uncle    Heart disease Maternal Grandfather    Cancer Other        father's aunt had breast cancer   Colon cancer Other        maternal great uncle, age greater than 43   Crohn's disease Other        couple of family members on father's side of family    Past Medical History:  Diagnosis Date   Anemia    Anxiety    Asthma    as child   Endometritis 01/11/2015   GERD (gastroesophageal reflux disease)    Headache    Hematochezia 05/08/2015   History of ovarian cyst 01/28/2015   IBS (irritable bowel syndrome)    Infection    UTI   Migraine headache    Nausea and vomiting 01/11/2015   Ovarian cyst 01/14/2015   Pelvic pain in female 01/11/2015   Pregnancy induced hypertension    with pregnancy   Pregnant 02/05/2016   Rectal bleeding 07/18/2013   Stroke [facility])     Patient Active Problem List   Diagnosis Date Noted   Chronic migraine without aura without status migrainosus, not intractable 11/23/2022   Abdominal wall pain in suprapubic region 07/28/2022   Dyspareunia, female 07/28/2022   Encounter for gynecological examination with Papanicolaou smear of cervix 07/28/2022   Sleep disturbance 06/05/2021   Nexplanon in place 01/22/2021   Weight gain 01/22/2021   Frequent headaches 01/22/2021   Pregnancy examination or test, negative result 01/22/2021   Swelling of both ankles 01/22/2021   Short of breath on exertion 01/22/2021   Nexplanon  insertion 08/26/2020   Sepsis (HCC) 07/25/2020   Pneumonia due to COVID-19 virus 07/25/2020   Chronic hypertension 03/21/2020   Headache disorder 01/01/2019   Chronic pain of both knees 08/25/2018   Constipation 08/25/2018   Acute left-sided low back pain with left-sided sciatica 01/09/2017   Morbid obesity (HCC) 01/09/2017   S/P cesarean section 03/04/2016   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   GERD (gastroesophageal reflux disease) 07/18/2013   Irritable bowel syndrome 07/03/2013   Depression with anxiety 07/03/2013   Asthma 03/27/2013    Past Surgical History:  Procedure Laterality Date   CESAREAN SECTION N/A 09/29/2014   Procedure: CESAREAN SECTION;  Surgeon: Tilda Burrow, MD;  Location: [facility] ORS;  Service: Obstetrics;  Laterality: N/A;  CESAREAN SECTION N/A 09/25/2016   Procedure: CESAREAN SECTION;  Surgeon: Tilda Burrow, MD;  Location: [facility] BIRTHING SUITES;  Service: Obstetrics;  Laterality: N/A;   CESAREAN SECTION N/A 08/25/2020   Procedure: CESAREAN SECTION;  Surgeon: Lazaro Arms, MD;  Location: [facility] LD ORS;  Service: Obstetrics;  Laterality: N/A;   CHOLECYSTECTOMY N/A 03/07/2018   Procedure: LAPAROSCOPIC CHOLECYSTECTOMY;  Surgeon: Franky Macho, MD;  Location: [facility] ORS;  Service: General;  Laterality: N/A;   COLONOSCOPY WITH PROPOFOL N/A 08/02/2017   Dr. Jena Gauss: moderate internal hemorhoids, distal 5 cm of TI also appeared normal   ESOPHAGOGASTRODUODENOSCOPY (EGD) WITH ESOPHAGEAL DILATION N/A 07/19/2013   Dr. Elmer Ramp appearing esophagus s/p dilation, normal D1 and D2, unremarkable path    ESOPHAGOGASTRODUODENOSCOPY (EGD) WITH PROPOFOL N/A 08/02/2017   Normal esophagus, small hiatal hernia, normal duodenum   none     WISDOM TOOTH EXTRACTION      Current Outpatient Medications  Medication Sig Dispense Refill   acetaminophen (TYLENOL) 500 MG tablet Take 2 tablets (1,000 mg total) by mouth every 6 (six) hours as needed for mild pain, moderate pain or headache.  60 tablet 0   albuterol (VENTOLIN HFA) 108 (90 Base) MCG/ACT inhaler Inhale 2 puffs into the lungs every 6 (six) hours as needed for wheezing or shortness of breath. 8 g 0   aspirin EC 81 MG tablet Take 81 mg by mouth daily. Swallow whole.     atorvastatin (LIPITOR) 10 MG tablet Take 1 tablet (10 mg total) by mouth daily. 90 tablet 3   azelastine (ASTELIN) 0.1 % nasal spray Place 2 sprays into both nostrils 2 (two) times daily. 30 mL 12   cetirizine (ZYRTEC ALLERGY) 10 MG tablet Take 1 tablet (10 mg total) by mouth daily. 30 tablet 2   etonogestrel (NEXPLANON) 68 MG IMPL implant 1 each by Subdermal route once.     fluticasone (FLONASE) 50 MCG/ACT nasal spray Place 2 sprays into both nostrils daily. 16 g 6   Galcanezumab-gnlm (EMGALITY) 120 MG/ML SOAJ Inject 1 Pen into the skin every 30 (thirty) days. 1 mL 11   naproxen (NAPROSYN) 500 MG tablet Take 1 tablet (500 mg total) by mouth 2 (two) times daily. (Patient not taking: Reported on 04/06/2023) 20 tablet 0   olopatadine (PATANOL) 0.1 % ophthalmic solution Place 1 drop into both eyes 2 (two) times daily. 5 mL 12   ondansetron (ZOFRAN) 4 MG tablet Take 1 tablet (4 mg total) by mouth every 8 (eight) hours as needed for nausea or vomiting. 20 tablet 1   ondansetron (ZOFRAN-ODT) 4 MG disintegrating tablet Take 1-2 tablets (4-8 mg total) by mouth every 8 (eight) hours as needed. 30 tablet 3   tiZANidine (ZANAFLEX) 2 MG tablet Take 1 tablet (2 mg total) by mouth every 6 (six) hours as needed for muscle spasms. 30 tablet 0   traZODone (DESYREL) 50 MG tablet Take 0.5-1 tablets (25-50 mg total) by mouth at bedtime as needed for sleep. 30 tablet 4   Ubrogepant (UBRELVY) 100 MG TABS Take 1 tablet (100 mg total) by mouth every 2 (two) hours as needed. Maximum 200mg  a day. 4 tablet 0   Ubrogepant (UBRELVY) 100 MG TABS Take 1 tablet (100 mg total) by mouth every 2 (two) hours as needed. Maximum 200mg  a day. 16 tablet 11   No current facility-administered  medications for this visit.    Allergies as of 04/26/2023 - Review Complete 04/06/2023  Allergen Reaction Noted   Adhesive [tape] Other (See Comments)  06/14/2015   Lorabid [loracarbef] Hives, Swelling, and Other (See Comments) 03/28/2013   Latex Itching and Rash 07/18/2013   Orange fruit [citrus] Rash 07/18/2013    Vitals: There were no vitals taken for this visit. Last Weight:  Wt Readings from Last 1 Encounters:  04/06/23 216 lb 6.4 oz (98.2 kg)   Last Height:   Ht Readings from Last 1 Encounters:  04/06/23 5\' 2"  (1.575 m)      Physical exam: Exam: Gen: NAD, conversant      CV: Denies palpitations or chest pain or SOB. VS: Breathing at a normal rate.  Not febrile. Eyes: Conjunctivae clear without exudates or hemorrhage  Neuro: Detailed Neurologic Exam  Speech:    Speech is normal; fluent and spontaneous with normal comprehension.  Cognition:    The patient is oriented to person, place, and time;     recent and remote memory intact;     language fluent;     normal attention, concentration,     fund of knowledge Cranial Nerves:    The pupils are equal, round, and reactive to light. A. Visual fields are full to finger confrontation. Extraocular movements are intact.  The face is symmetric with normal sensation. The palate elevates in the midline. Hearing intact. Voice is normal. Shoulder shrug is normal. The tongue has normal motion without fasciculations.   Coordination:    Normal finger to nose  Gait:    Normal native gait  Motor Observation:   no involuntary movements noted. Tone:    Appears normal  Posture:    Posture is normal. normal erect    Strength:    Strength is anti-gravity and symmetric in the upper and lower limbs.      Sensation: intact to LT         Assessment/Plan:  Patient with chronic migraines and remote lacunar stroke found on MRI, complete stroke wkup neg, do not take triptans in the future and stay on baby aspirin.    Patient  having 8 migraine days a month and > 15 total headache days on emgality. Both Ajovy and emgality helped about 50%. Still a significant burden of migraies we can try Vyepti. Stop emgality. Failed: Ajovy, Vyepti, Aimovig contraindicated due to constipation, triptans contraindicated due to stroke (bit has tried imitrex and maxalt), tylenol, asa, fioricet,celexa, flexeril,decadron,dicofenac,lexapro,benadryl, gabapentin, ibuprofen,ketorolac, reglan, naproxen, topiramate, trazodone, BP med contraindicated due to hypotension, nortriptyline, Next could try botox  Stroke: MRI showed a remote lacunar infarct, patient back to discuss with fiance.  We saw patient for migraines, MRI of the brain showed a remote lacunar infarct at the left basal ganglia.  Unfortunately there is no telling how old this lacunar infarct is, could have even been in utero.  We reviewed the images together with her fianc, discussed ischemic strokes lacunar infarcts embolic type events and all the different types of cerebrovascular accidents that can happen.  Hers looks like lacunar.  Despite remote she needs a thorough evaluation including imaging of her blood vessels, a complete hypercoagulable profile, lipids, hemoglobin A1c, we have to mitigate every risk for stroke.  We discussed in detail answered all questions.    MRI brain 12/16/2022: Personally reviewed with patient and completely agree. IMPRESSION: 1. No acute intracranial abnormality. 2. Remote lacunar infarct at the anterior left basal ganglia,   CT Angiogram head and neck: No large vessel occlusion,hemodynamically significant stenosis, or other acute vascular abnormality. MRI: Lacunar Stroke, remote Baby aspirin every day Blood work: Garment/textile technologist, diabetes, cholesterol etc  come back at 8am one day fasting TCD: Trans Cranial Doppler: No HITS at rest or during Valsalva. Negative transcranial Doppler Bubble study with no evidence of right to left intracardiac  communication. Echocardiogram with bubble study: The TCD test did not show a PFO (patent foramen ovale) which we discussed in appointment and can be a risk factor for stroke. The Echocardiogram however did show a few bubbles that may indicate a very small PFO but cardiology thinks too small to be clinically warranted closure 30 day heart monitor : No abnormal rhythms seen except your heart rate gets elevated and that is when you feel it I would discuss with primary care at next appointment non urgently but nothing abnormal as far as rhythm  Wegovy or Zepbound - or can send you to a weight loss center (Recommmend BMI <27-28)  Start Vyepti:  When you have a migraine: Try Ubrelvy.  Please take one tablet at the onset of your headache. If it does not improve the symptoms please take one additional tablet 2 hours.  Cannot take anything ending in "Triptan" anymore stable.   No orders of the defined types were placed in this encounter.  No orders of the defined types were placed in this encounter.   Cc: Babs Sciara, MD,  Babs Sciara, MD  Naomie Dean, MD  [facility] [address] [place], [place] [address]  Phone [phone] Fax [phone]

## 2023-04-27 ENCOUNTER — Telehealth: Payer: Self-pay | Admitting: *Deleted

## 2023-04-27 NOTE — Telephone Encounter (Signed)
Order form for Vyepti 100 mg IV every 3 months is complete and is pending Dr Trevor Mace signature.

## 2023-04-27 NOTE — Telephone Encounter (Signed)
-----   Message from Anson Fret, MD sent at 04/26/2023  2:30 PM EDT ----- Regarding: g43.709 lets start vyepti approval please Can we start patient on Vyepti please?    Patient having 8 migraine days a month and > 15 total headache days on emgality. Both Ajovy and emgality helped about 50%. Still a significant burden of migraies we can try Vyepti. Stop emgality. Failed: Ajovy, Vyepti, Aimovig contraindicated due to constipation, triptans contraindicated due to stroke (bit has tried imitrex and maxalt), tylenol, asa, fioricet,celexa, flexeril,decadron,dicofenac,lexapro,benadryl, gabapentin, ibuprofen,ketorolac, reglan, naproxen, topiramate, trazodone, BP med contraindicated due to hypotension, nortriptyline,

## 2023-04-27 NOTE — Telephone Encounter (Signed)
Vyepti order form signed by Dr Lucia Gaskins. Order form given to [facility].

## 2023-05-04 NOTE — Telephone Encounter (Signed)
Form signed. Thanks!

## 2023-05-04 NOTE — Telephone Encounter (Signed)
To [facility].

## 2023-05-04 NOTE — Telephone Encounter (Signed)
[employer] medication precertification request for completed and needs signature.  Dr. Lucia Gaskins out of office.  Sent to [facility], Dr. San Morelle for signature.

## 2023-05-07 ENCOUNTER — Encounter: Payer: Self-pay | Admitting: Family Medicine

## 2023-05-09 ENCOUNTER — Other Ambulatory Visit: Payer: Self-pay | Admitting: Family Medicine

## 2023-05-11 ENCOUNTER — Encounter: Payer: Self-pay | Admitting: Orthopaedic Surgery

## 2023-05-11 ENCOUNTER — Ambulatory Visit (INDEPENDENT_AMBULATORY_CARE_PROVIDER_SITE_OTHER): Payer: Commercial Managed Care - PPO | Admitting: Orthopaedic Surgery

## 2023-05-11 VITALS — BP 131/78 | HR 96 | Ht 62.0 in | Wt 216.0 lb

## 2023-05-11 DIAGNOSIS — G8929 Other chronic pain: Secondary | ICD-10-CM | POA: Diagnosis not present

## 2023-05-11 DIAGNOSIS — M25562 Pain in left knee: Secondary | ICD-10-CM

## 2023-05-11 MED ORDER — METHYLPREDNISOLONE ACETATE 40 MG/ML IJ SUSP
40.0000 mg | Freq: Once | INTRAMUSCULAR | Status: AC
  Administered 2023-05-11: 40 mg via INTRA_ARTICULAR

## 2023-05-11 NOTE — Progress Notes (Signed)
PROCEDURE NOTE:  The patient requests injections of the left knee , verbal consent was obtained.  The left knee was prepped appropriately after time out was performed.   Sterile technique was observed and injection of 1 cc of DepoMedrol 40 mg with several cc's of plain xylocaine. Anesthesia was provided by ethyl chloride and a 20-gauge needle was used to inject the knee area. The injection was tolerated well.  A band aid dressing was applied.  The patient was advised to apply ice later today and tomorrow to the injection sight as needed.  Encounter Diagnosis  Name Primary?   Chronic pain of left knee Yes   Return prn.  Call if any problem.  Precautions discussed.  Electronically Signed Darreld Mclean, MD 6/4/202410:23 AM

## 2023-05-14 ENCOUNTER — Encounter: Payer: Self-pay | Admitting: Orthopaedic Surgery

## 2023-05-27 ENCOUNTER — Ambulatory Visit: Payer: Commercial Managed Care - PPO | Admitting: Orthopaedic Surgery

## 2023-05-27 DIAGNOSIS — G43709 Chronic migraine without aura, not intractable, without status migrainosus: Secondary | ICD-10-CM | POA: Diagnosis not present

## 2023-06-22 ENCOUNTER — Other Ambulatory Visit: Payer: Self-pay | Admitting: Nurse Practitioner

## 2023-06-22 ENCOUNTER — Other Ambulatory Visit: Payer: Self-pay | Admitting: Neurology

## 2023-06-22 ENCOUNTER — Other Ambulatory Visit (HOSPITAL_COMMUNITY): Payer: Self-pay

## 2023-06-22 DIAGNOSIS — R051 Acute cough: Secondary | ICD-10-CM

## 2023-06-23 ENCOUNTER — Other Ambulatory Visit (HOSPITAL_COMMUNITY): Payer: Self-pay

## 2023-06-23 ENCOUNTER — Other Ambulatory Visit: Payer: Self-pay

## 2023-06-23 MED ORDER — ALBUTEROL SULFATE HFA 108 (90 BASE) MCG/ACT IN AERS
2.0000 | INHALATION_SPRAY | Freq: Four times a day (QID) | RESPIRATORY_TRACT | 0 refills | Status: AC | PRN
Start: 2023-06-23 — End: ?
  Filled 2023-06-23: qty 6.7, 25d supply, fill #0

## 2023-06-24 MED ORDER — UBRELVY 100 MG PO TABS
100.0000 mg | ORAL_TABLET | ORAL | 0 refills | Status: DC | PRN
  Filled 2023-06-24: qty 16, 30d supply, fill #0

## 2023-06-25 ENCOUNTER — Other Ambulatory Visit: Payer: Self-pay

## 2023-07-16 ENCOUNTER — Ambulatory Visit: Payer: Commercial Managed Care - PPO | Admitting: Nurse Practitioner

## 2023-07-16 VITALS — BP 114/74 | HR 100 | Temp 98.2°F | Ht 62.0 in | Wt 222.0 lb

## 2023-07-16 DIAGNOSIS — F439 Reaction to severe stress, unspecified: Secondary | ICD-10-CM | POA: Insufficient documentation

## 2023-07-16 DIAGNOSIS — D500 Iron deficiency anemia secondary to blood loss (chronic): Secondary | ICD-10-CM | POA: Diagnosis not present

## 2023-07-16 DIAGNOSIS — R5383 Other fatigue: Secondary | ICD-10-CM | POA: Diagnosis not present

## 2023-07-16 DIAGNOSIS — N921 Excessive and frequent menstruation with irregular cycle: Secondary | ICD-10-CM | POA: Diagnosis not present

## 2023-07-16 NOTE — Progress Notes (Signed)
Subjective:    Patient ID: Melissa Livingston, female    DOB: 02-22-1996, 27 y.o.   MRN: [id number]  HPI Pt come sin today to discuss being fatigue for about 2 months now. Pt states she has been constantly tired and is not sure what is causing this. Has Nexplanon for birth control. Having heavy cycles most of the time lasting 7-8 days. Has an appointment with GYN this month to discuss options.  States she is "constantly exhausted"; has gained over 20 lbs since January. Since then has noticed slight SOB and getting "tired easily". Denies any loud snoring or gasping for air while sleeping.  Has had financial issues since her husband lost her job but he is starting a new one soon. The couple along with their 2 young children are living with her in laws. States her father-in-law has an alcohol abuse issue which makes a strained situation since she does not want her children around him when he is drinking. Has plans to save money so they can find another place to live.  Denies suicidal or homicidal thoughts or ideation.     07/16/2023    3:22 PM  Depression screen PHQ 2/9  Decreased Interest 0  Down, Depressed, Hopeless 0  PHQ - 2 Score 0  Altered sleeping 3  Tired, decreased energy 3  Change in appetite 0  Feeling bad or failure about yourself  0  Trouble concentrating 3  Moving slowly or fidgety/restless 3  Suicidal thoughts 0  PHQ-9 Score 12      07/16/2023    3:22 PM 04/06/2023    9:59 AM 09/21/2022    8:54 AM 07/28/2022   11:40 AM  GAD 7 : Generalized Anxiety Score  Nervous, Anxious, on Edge 1 1 0 2  Control/stop worrying 0 1 2 2   Worry too much - different things 0 0 2 3  Trouble relaxing 2 0 0 3  Restless 0 0 2 0  Easily annoyed or irritable 2 1 1  0  Afraid - awful might happen 0 0 0 0  Total GAD 7 Score 5 3 7 10   Anxiety Difficulty Somewhat difficult Not difficult at all Somewhat difficult       Review of Systems  Constitutional:  Positive for appetite change and fatigue.   HENT:  Negative for sore throat and trouble swallowing.   Respiratory:  Positive for shortness of breath. Negative for choking and chest tightness.   Cardiovascular:  Negative for chest pain, palpitations and leg swelling.  Psychiatric/Behavioral:  Positive for dysphoric mood and sleep disturbance. Negative for self-injury and suicidal ideas. The patient is nervous/anxious.        Objective:   Physical Exam NAD. Alert, oriented. Tired in appearance. Becomes tearful when discussing her home situation but otherwise calm affect. Speech clear. Making good eye contact. Thyroid non tender to palpation, no mass or goiter noted.  Lungs clear.  Heart regular rate rhythm.  Lower extremities no edema. Today's Vitals   07/16/23 1458  BP: 114/74  Pulse: 100  Temp: 98.2 F (36.8 C)  SpO2: 99%  Weight: 222 lb (100.7 kg)  Height: 5\' 2"  (1.575 m)   Body mass index is 40.6 kg/m.       Assessment & Plan:   Problem List Items Addressed This Visit       Other   Iron deficiency anemia due to chronic blood loss - Primary   Relevant Orders   CBC with Differential/Platelet (Completed)   Iron, TIBC  and Ferritin Panel (Completed)   Menorrhagia with irregular cycle   Situational stress   Other Visit Diagnoses     Fatigue, unspecified type       Relevant Orders   Comprehensive metabolic panel (Completed)   CBC with Differential/Platelet (Completed)   TSH (Completed)      Lab work pending. Lengthy discussion regarding stress reduction.  Encourage patient to continue to work on getting another place to live.  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  Defers medication for her stress  and anxiety at this time but will call back if she changes her mind.  Follow-up with gynecology as planned regarding menorrhagia. Recheck here as needed.

## 2023-07-18 ENCOUNTER — Encounter: Payer: Self-pay | Admitting: Nurse Practitioner

## 2023-07-21 ENCOUNTER — Telehealth: Payer: Self-pay | Admitting: Neurology

## 2023-07-21 NOTE — Telephone Encounter (Signed)
 LVM and sent mychart msg informing pt of need to reschedule 11/03/23 appt - NP out

## 2023-07-22 ENCOUNTER — Other Ambulatory Visit (HOSPITAL_COMMUNITY): Payer: Self-pay

## 2023-08-06 ENCOUNTER — Other Ambulatory Visit (HOSPITAL_COMMUNITY)
Admission: RE | Admit: 2023-08-06 | Discharge: 2023-08-06 | Disposition: A | Discharge status: Home / Self Care | Payer: Commercial Managed Care - PPO | Source: Ambulatory Visit | Attending: Adult Health | Admitting: Adult Health

## 2023-08-06 ENCOUNTER — Ambulatory Visit: Payer: Commercial Managed Care - PPO | Admitting: Adult Health

## 2023-08-06 ENCOUNTER — Encounter: Payer: Self-pay | Admitting: Adult Health

## 2023-08-06 VITALS — BP 125/78 | HR 84 | Ht 62.0 in | Wt 219.0 lb

## 2023-08-06 DIAGNOSIS — Z975 Presence of (intrauterine) contraceptive device: Secondary | ICD-10-CM | POA: Diagnosis not present

## 2023-08-06 DIAGNOSIS — Z113 Encounter for screening for infections with a predominantly sexual mode of transmission: Secondary | ICD-10-CM | POA: Insufficient documentation

## 2023-08-06 DIAGNOSIS — Z1330 Encounter for screening examination for mental health and behavioral disorders, unspecified: Secondary | ICD-10-CM | POA: Diagnosis not present

## 2023-08-06 DIAGNOSIS — Z3009 Encounter for other general counseling and advice on contraception: Secondary | ICD-10-CM | POA: Insufficient documentation

## 2023-08-06 DIAGNOSIS — N898 Other specified noninflammatory disorders of vagina: Secondary | ICD-10-CM | POA: Insufficient documentation

## 2023-08-06 DIAGNOSIS — N920 Excessive and frequent menstruation with regular cycle: Secondary | ICD-10-CM | POA: Diagnosis not present

## 2023-08-06 DIAGNOSIS — Z01419 Encounter for gynecological examination (general) (routine) without abnormal findings: Secondary | ICD-10-CM | POA: Diagnosis not present

## 2023-08-06 MED ORDER — FLUCONAZOLE 150 MG PO TABS: ORAL_TABLET | ORAL | 1 refills | Status: DC

## 2023-08-06 MED ORDER — IRON 325 (65 FE) MG PO TABS: ORAL_TABLET | ORAL | Status: DC

## 2023-08-06 MED ORDER — MISOPROSTOL 200 MCG PO TABS: ORAL_TABLET | ORAL | 0 refills | Status: DC

## 2023-08-06 NOTE — Progress Notes (Signed)
Patient ID: Melissa Livingston, female   DOB: Dec 04, 1996, 27 y.o.   MRN: [id number] History of Present Illness: Melissa Livingston is a 27 year old black female with SO, G3P3003, in for a well woman gyn exam. She has nexplanon that is due to be removed in September, and is having 1-2 week periods that are heavy, will change every 2 hours, and she has been tired, HGB was 11 07/16/23 and Ferritin was 7, iron sat 8, when she saw Sherie Don NP. 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  Has had some itching, on antibiotics.      Component Value Date/Time   DIAGPAP  07/28/2022 1150    - Negative for intraepithelial lesion or malignancy (NILM)   DIAGPAP  06/05/2019 0000    NEGATIVE FOR INTRAEPITHELIAL LESIONS OR MALIGNANCY.   HPVHIGH Negative 07/28/2022 1150   ADEQPAP  07/28/2022 1150    Satisfactory for evaluation; transformation zone component PRESENT.   ADEQPAP  06/05/2019 0000    Satisfactory for evaluation  endocervical/transformation zone component PRESENT.   PCP is Dr Lilyan Punt   Current Medications, Allergies, Past Medical History, Past Surgical History, Family History and Social History were reviewed in [employer] electronic medical record.     Review of Systems:  Patient denies any headaches, hearing loss, blurred vision, shortness of breath, chest pain, abdominal pain, problems with bowel movements, urination, or intercourse. No joint pain or mood swings.  See HPI for positives   Physical Exam:BP 125/78 (BP Location: Left Arm, Patient Position: Sitting, Cuff Size: Large)   Pulse 84   Ht 5\' 2"  (1.575 m)   Wt 219 lb (99.3 kg)   LMP 07/23/2023   BMI 40.06 kg/m   General:  Well developed, well nourished, no acute distress Skin:  Warm and dry Neck:  Midline trachea, normal thyroid, good ROM, no lymphadenopathy Lungs; Clear to auscultation bilaterally Breast:  No dominant palpable mass, retraction, or nipple  discharge Cardiovascular: Regular rate and rhythm Abdomen:  Soft, non tender, no hepatosplenomegaly Pelvic:  External genitalia is normal in appearance, no lesions.  The vagina is normal in appearance. Urethra has no lesions or masses. The cervix is smooth. Uterus is felt to be normal size, shape, and contour.  No adnexal masses or tenderness noted.Bladder is non tender, no masses felt. Extremities/musculoskeletal:  No swelling or varicosities noted, no clubbing or cyanosis Psych:  No mood changes, alert and cooperative,seems happy AA is 3 Fall risk is low    08/06/2023    9:27 AM 07/16/2023    3:22 PM 04/06/2023    9:59 AM  Depression screen PHQ 2/9  Decreased Interest 0 0 0  Down, Depressed, Hopeless 0 0 1  PHQ - 2 Score 0 0 1  Altered sleeping 2 3 1   Tired, decreased energy 2 3 1   Change in appetite 1 0 0  Feeling bad or failure about yourself  0 0 0  Trouble concentrating 0 3 0  Moving slowly or fidgety/restless 1 3 1   Suicidal thoughts 0 0 0  PHQ-9 Score 6 12 4   Difficult doing work/chores   Not difficult at all       08/06/2023    9:27 AM 07/16/2023    3:22 PM 04/06/2023    9:59 AM 09/21/2022    8:54 AM  GAD 7 : Generalized Anxiety Score  Nervous, Anxious, on Edge 1 1 1  0  Control/stop  worrying 0 0 1 2  Worry too much - different things 1 0 0 2  Trouble relaxing 0 2 0 0  Restless 0 0 0 2  Easily annoyed or irritable 0 2 1 1   Afraid - awful might happen 0 0 0 0  Total GAD 7 Score 2 5 3 7   Anxiety Difficulty  Somewhat difficult Not difficult at all Somewhat difficult      Upstream - 08/06/23 0935       Pregnancy Intention Screening   Does the patient want to become pregnant in the next year? Unsure    Does the patient's partner want to become pregnant in the next year? Unsure    Would the patient like to discuss contraceptive options today? No      Contraception Wrap Up   Current Method Hormonal Implant    End Method Hormonal Implant    Contraception Counseling  Provided Yes            Examination chaperoned by Malachy Mood LPN   Impression and Plan: 1. Encounter for well woman exam with routine gynecological exam Physical in 1 year Pap in 2026  2. Nexplanon in place Paced 08/26/20 after delivery   3. Menorrhagia with regular cycle Periods lasting 1-2 weeks and heavy  Take iron every day  4. Vaginal itching On antibiotic for tooth, as had itching CV swab sent  Will rx diflucan   Meds ordered this encounter  Medications   fluconazole (DIFLUCAN) 150 MG tablet    Sig: Take 1 now and 1 in 3 days if needed    Dispense:  2 tablet    Refill:  1    Order Specific Question:   Supervising Provider    Answer:   Duane Lope H [[id number]]   Ferrous Sulfate (IRON) 325 (65 Fe) MG TABS    Sig: Take 1 daily    Order Specific Question:   Supervising Provider    Answer:   Despina Hidden, LUTHER H [[id number]]   misoprostol (CYTOTEC) 200 MCG tablet    Sig: Take 2 po 4 hours prior to IUD insertion    Dispense:  2 tablet    Refill:  0    Order Specific Question:   Supervising Provider    Answer:   Duane Lope H [[id number]]    - Cervicovaginal ancillary only( [facility])  5. Screening examination for STD (sexually transmitted disease) CV swab sent for GC/CHL,trich,BV and yeast  - Cervicovaginal ancillary only( )  6. General counseling and advice for contraceptive management Discussed options, like depo, POP and IUD  Discussed with Dr Despina Hidden too and Mirena IUD best option per him   Return 08/19/23 for IUD insertion and nexplanon removal with Dr Charlotta Newton Will rx cytotec before IUD insertion, has had 3 C -sections

## 2023-08-12 LAB — CERVICOVAGINAL ANCILLARY ONLY
Bacterial Vaginitis (gardnerella): NEGATIVE
Candida Glabrata: NEGATIVE
Candida Vaginitis: POSITIVE — AB
Chlamydia: NEGATIVE
Comment: NEGATIVE
Comment: NEGATIVE
Comment: NEGATIVE
Comment: NEGATIVE
Comment: NEGATIVE
Comment: NORMAL
Neisseria Gonorrhea: NEGATIVE
Trichomonas: NEGATIVE

## 2023-08-19 ENCOUNTER — Ambulatory Visit (INDEPENDENT_AMBULATORY_CARE_PROVIDER_SITE_OTHER): Payer: Commercial Managed Care - PPO | Admitting: Obstetrics & Gynecology

## 2023-08-19 ENCOUNTER — Encounter: Payer: Self-pay | Admitting: Obstetrics & Gynecology

## 2023-08-19 VITALS — BP 139/80 | HR 100 | Ht 62.0 in | Wt 220.0 lb

## 2023-08-19 DIAGNOSIS — N946 Dysmenorrhea, unspecified: Secondary | ICD-10-CM | POA: Diagnosis not present

## 2023-08-19 DIAGNOSIS — N920 Excessive and frequent menstruation with regular cycle: Secondary | ICD-10-CM | POA: Diagnosis not present

## 2023-08-19 DIAGNOSIS — Z3009 Encounter for other general counseling and advice on contraception: Secondary | ICD-10-CM

## 2023-08-19 DIAGNOSIS — Z3046 Encounter for surveillance of implantable subdermal contraceptive: Secondary | ICD-10-CM | POA: Diagnosis not present

## 2023-08-19 DIAGNOSIS — Z30013 Encounter for initial prescription of injectable contraceptive: Secondary | ICD-10-CM

## 2023-08-19 MED ORDER — MEDROXYPROGESTERONE ACETATE 150 MG/ML IM SUSP
150.0000 mg | INTRAMUSCULAR | 3 refills | Status: DC
Start: 2023-08-19 — End: 2023-11-18

## 2023-08-19 MED ORDER — MEDROXYPROGESTERONE ACETATE 150 MG/ML IM SUSY
150.0000 mg | PREFILLED_SYRINGE | Freq: Once | INTRAMUSCULAR | Status: AC
Start: 2023-08-19 — End: 2023-08-19
  Administered 2023-08-19: 150 mg via INTRAMUSCULAR

## 2023-08-19 NOTE — Progress Notes (Signed)
GYN VISIT Patient name: Melissa Livingston MRN [id number]  Date of birth: 1996-02-18 Chief Complaint:   Contraception  History of Present Illness:   Melissa Livingston is a 27 y.o. [phone] female being seen today for the following concerns:   Contraceptive management: Nexplanon in for 3 yrs.  Noted change in her period- typically last 5 days now lasting 8-10 days.  Changing a pad every 2 hours with passage of small to moderate-sized clot.  +dysmenorrhea.  Taking OTC meds with minimal improvement.  Previously tried Depot, COCs and IUD.   PMHx significant for stroke- noted on MRI.  Seen by neurology for worsening migraines, imaging done.  Only on ASA daily  Of note, she may would consider another pregnancy; however, she is concerned about risk.   Denies vaginal discharge, itching or irritation.  Denies pelvic pain outside of her menses.  Reports no other acute GYN concerns  Patient's last menstrual period was 07/23/2023.    Review of Systems:   Pertinent items are noted in HPI Denies fever/chills, dizziness, headaches, visual disturbances, fatigue, shortness of breath, chest pain, abdominal pain, vomiting, no problems with  bowel movements, urination, or intercourse unless otherwise stated above.  Pertinent History Reviewed:   Past Surgical History:  Procedure Laterality Date   CESAREAN SECTION N/A 09/29/2014   Procedure: CESAREAN SECTION;  Surgeon: Tilda Burrow, MD;  Location: [facility];  Service: Obstetrics;  Laterality: N/A;   CESAREAN SECTION N/A 09/25/2016   Procedure: CESAREAN SECTION;  Surgeon: Tilda Burrow, MD;  Location: [facility]IRTHING SUIT[facility];  Service: Obstetrics;  Laterality: N/A;   CESAREAN SECTION N/A 08/25/2020   Procedure: CESAREAN SECTION;  Surgeon: Lazaro Arms, MD;  Location: [facility] LD ORS;  Service: Obstetrics;  Laterality: N/A;   CHOLECYSTECTOMY N/A 03/07/2018   Procedure: LAPAROSCOPIC CHOLECYSTECTOMY;  Surgeon: Franky Macho, MD;  Location: [facility];  Service: General;  Laterality:  N/A;   COLONOSCOPY WITH PROPOFOL N/A 08/02/2017   Dr. Jena Gauss: moderate internal hemorhoids, distal 5 cm of TI also appeared normal   ESOPHAGOGASTRODUODENOSCOPY (EGD) WITH ESOPHAGEAL DILATION N/A 07/19/2013   Dr. Elmer Ramp appearing esophagus s/p dilation, normal D1 and D2, unremarkable path    ESOPHAGOGASTRODUODENOSCOPY (EGD) WITH PROPOFOL N/A 08/02/2017   Normal esophagus, small hiatal hernia, normal duodenum   none     WISDOM TOOTH EXTRACTION      Past Medical History:  Diagnosis Date   Anemia    Anxiety    Asthma    as child   Endometritis 01/11/2015   GERD (gastroesophageal reflux disease)    Headache    Hematochezia 05/08/2015   History of ovarian cyst 01/28/2015   IBS (irritable bowel syndrome)    Infection    UTI   Migraine headache    Nausea and vomiting 01/11/2015   Ovarian cyst 01/14/2015   Pelvic pain in female 01/11/2015   Pregnancy induced hypertension    with pregnancy   Pregnant 02/05/2016   Rectal bleeding 07/18/2013   Stroke [facility])    Reviewed problem list, medications and allergies. Physical Assessment:   Vitals:   08/19/23 1345  BP: 139/80  Pulse: 100  Weight: 220 lb (99.8 kg)  Height: 5\' 2"  (1.575 m)  Body mass index is 40.24 kg/m.       Physical Examination:   General appearance: alert, well appearing, and in no distress  Psych: mood appropriate, normal affect  Skin: warm & dry   Cardiovascular: normal heart rate noted  Respiratory: normal respiratory effort, no  distress Pelvic: examination not indicated  Extremities: no edema, right arm with palpable Nexplanon  Chaperone: N/A     NEXPLANON REMOVAL   Risks/benefits/side effects of Nexplanon have been discussed and her questions have been answered.  Signed copy of informed consent in chart.    Nexplanon site identified.  Area prepped in usual sterile fashon. Two cc's of 2% lidocaine was used to anesthetize the area. A small stab incision was made right beside the implant on the distal  portion.  The Nexplanon rod was grasped using hemostats and removed intact without difficulty.  Steri-strips and a pressure bandage was applied.  There was less than 3 cc blood loss. There were no complications.  The patient tolerated the procedure well.  Assessment & Plan:  1) Family planning/General counseling regarding contraception -Medical history reviewed-patient notes history of preeclampsia with each pregnancy and prior C-section x 3.  As mentioned prior history of stroke.  Discussed that while a pregnancy is not contraindicated her medical history does put her at high risk for recurrence of preeclampsia and long-term morbidity and mortality related to heart disease.  Also discussed increased risk of VTE. Discussed that should she strongly desire pregnancy she could also discuss this as an option with MFM.  After much discussion, patient does not desire future pregnancy  -Reviewed all contraceptive options-mended to progesterone only including Pops, Depo, LARC -Also discussed permanent sterilization including vasectomy, bilateral salpingectomy -Plan to start on Depo for contraception and to regulate menses    2) Nexplanon removal -Device removed without complication  3) HMB/dysmenorrhea Discussed long term management and goals Briefly discussed surgical intervention such as ablation or hysterectomy Plan for pelvic US to r/o underlying etiology, further management pending results of [place] -plan to start on Depot for now and f/u in 3mos  Meds ordered this encounter  Medications   medroxyPROGESTERone Acetate SUSY 150 mg   medroxyPROGESTERone (DEPO-PROVERA) 150 MG/ML injection    Sig: Inject 1 mL (150 mg total) into the muscle every 3 (three) months.    Dispense:  1 mL    Refill:  3     Orders Placed This Encounter  Procedures   US PELVIC COMPLETE WITH TRANSVAGINAL    Standing Status:   Future    Standing Expiration Date:   08/18/2024    Order Specific Question:   Reason for Exam  (SYMPTOM  OR DIAGNOSIS REQUIRED)    Answer:   heavy menstrual bleeding    Order Specific Question:   Preferred imaging location?    Answer:   Internal    Return in about 3 months (around 11/18/2023) for Depot/follow up.   [name], DO Attending Obstetrician & Gynecologist, [facility] for [employer], [facility]

## 2023-08-26 IMAGING — DX DG ANKLE COMPLETE 3+V*R*
3 series · 3 of 3 positions shown · non-contrast
Comparison: None.

CLINICAL DATA: Twisted right ankle when stepping off curb. Lateral
pain.

EXAM:
RIGHT ANKLE - COMPLETE 3+ VIEW

[ankle ap]
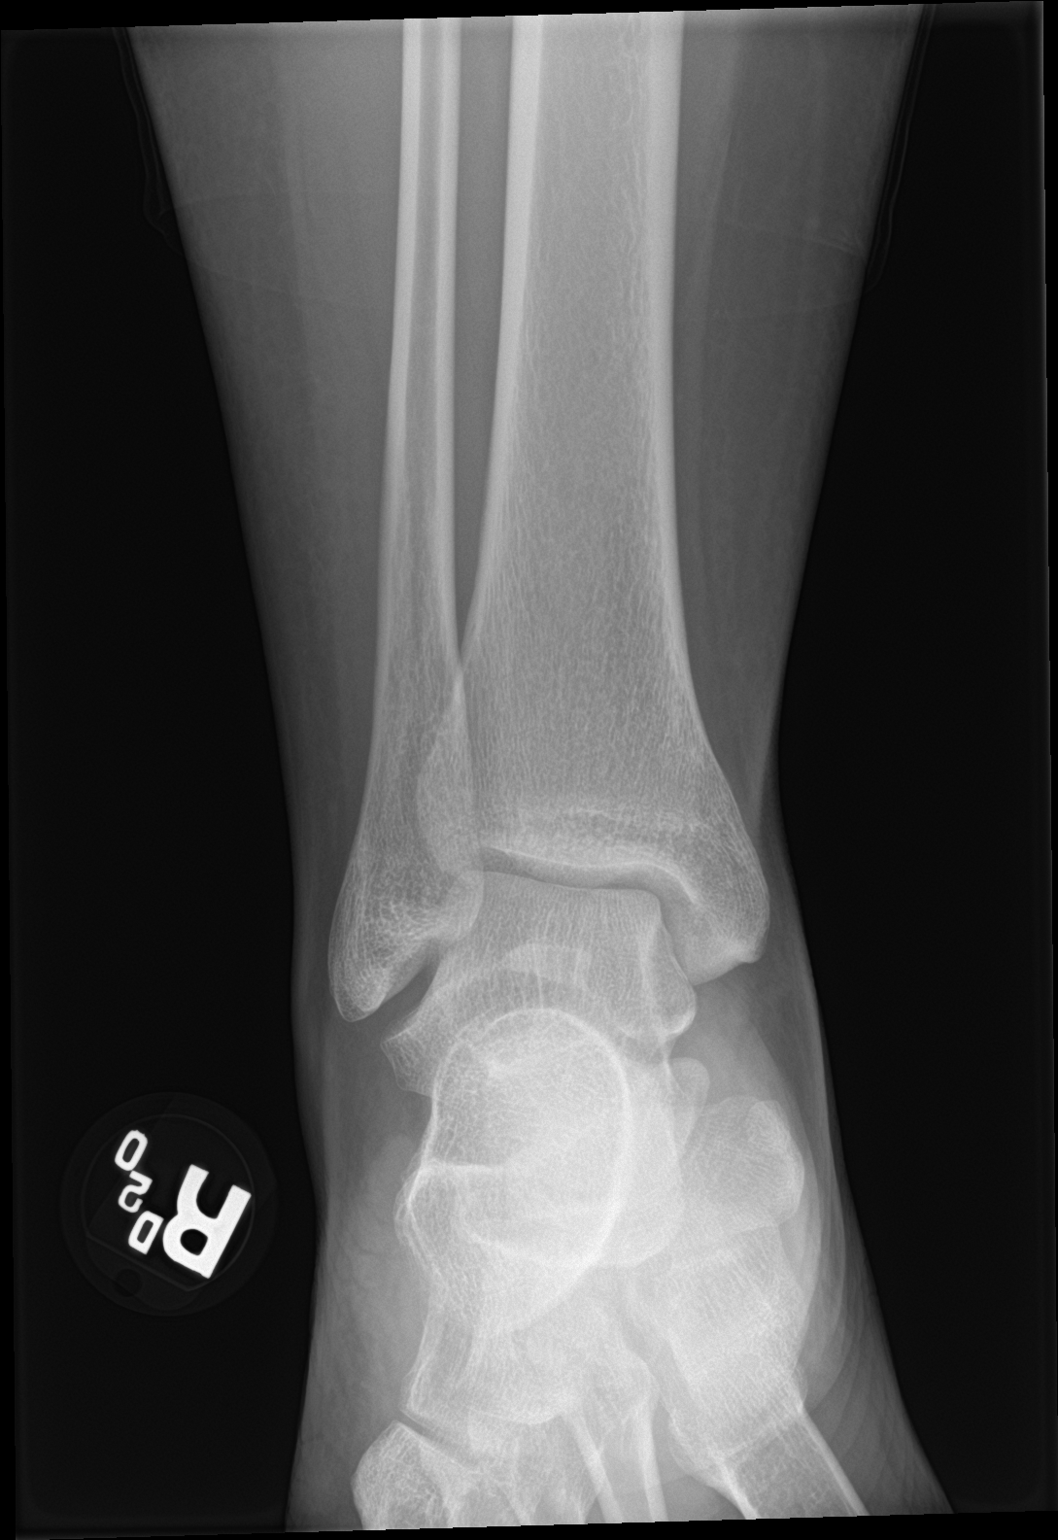

[ankle obl]
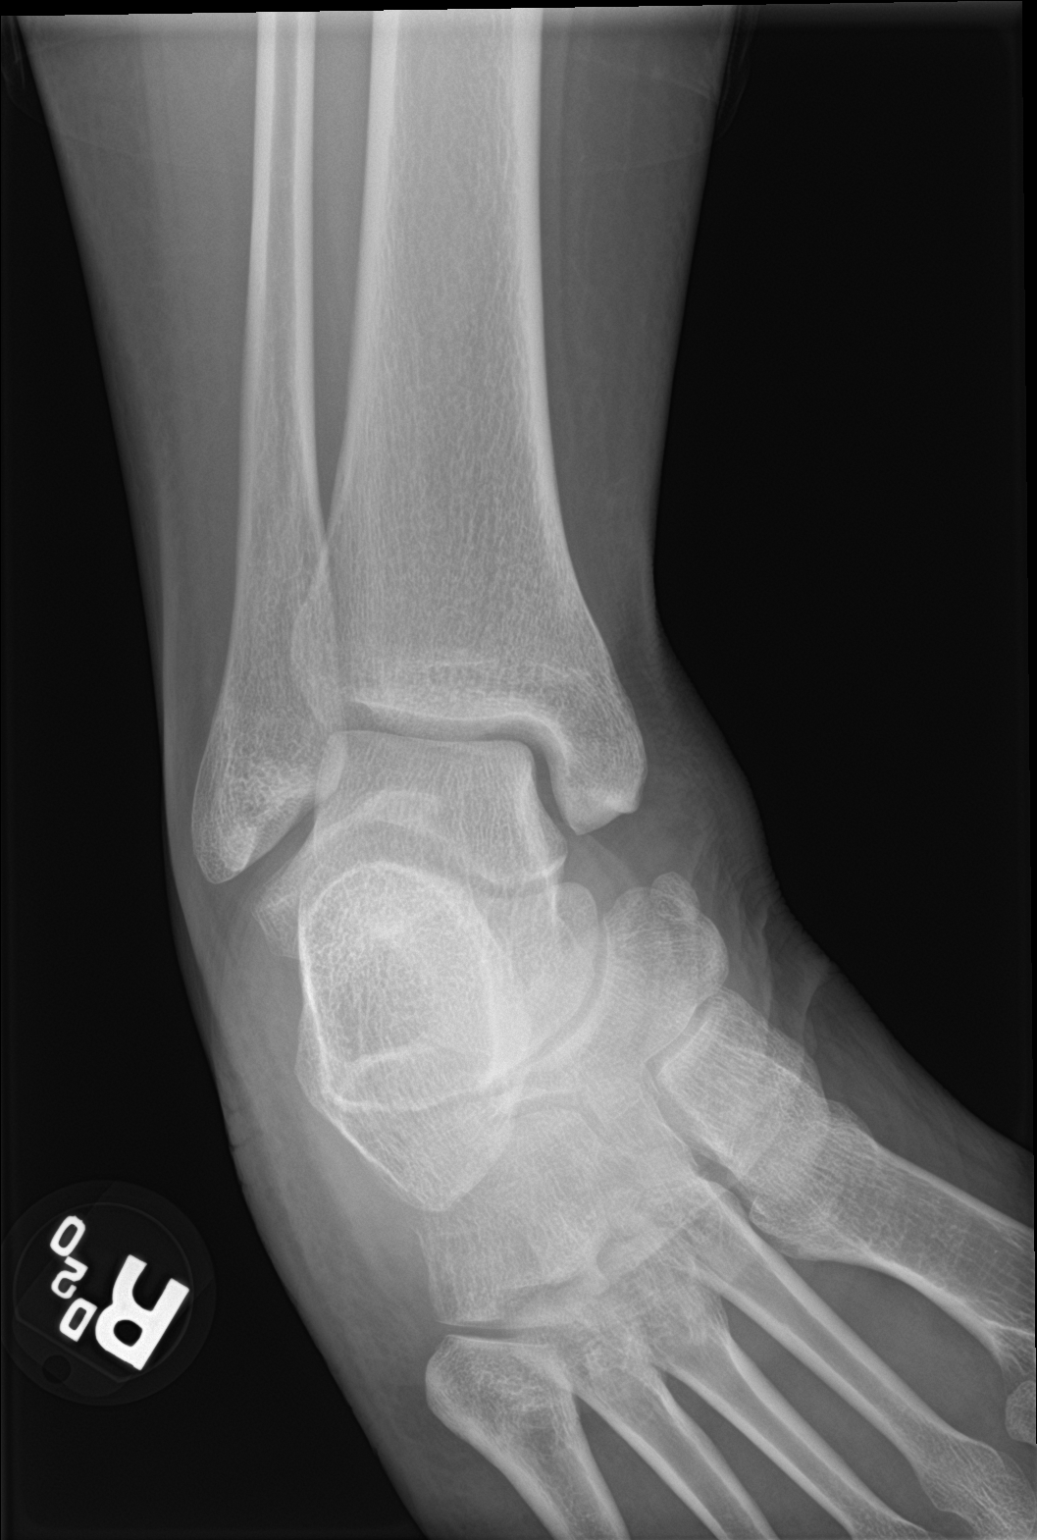

[ankle lat]
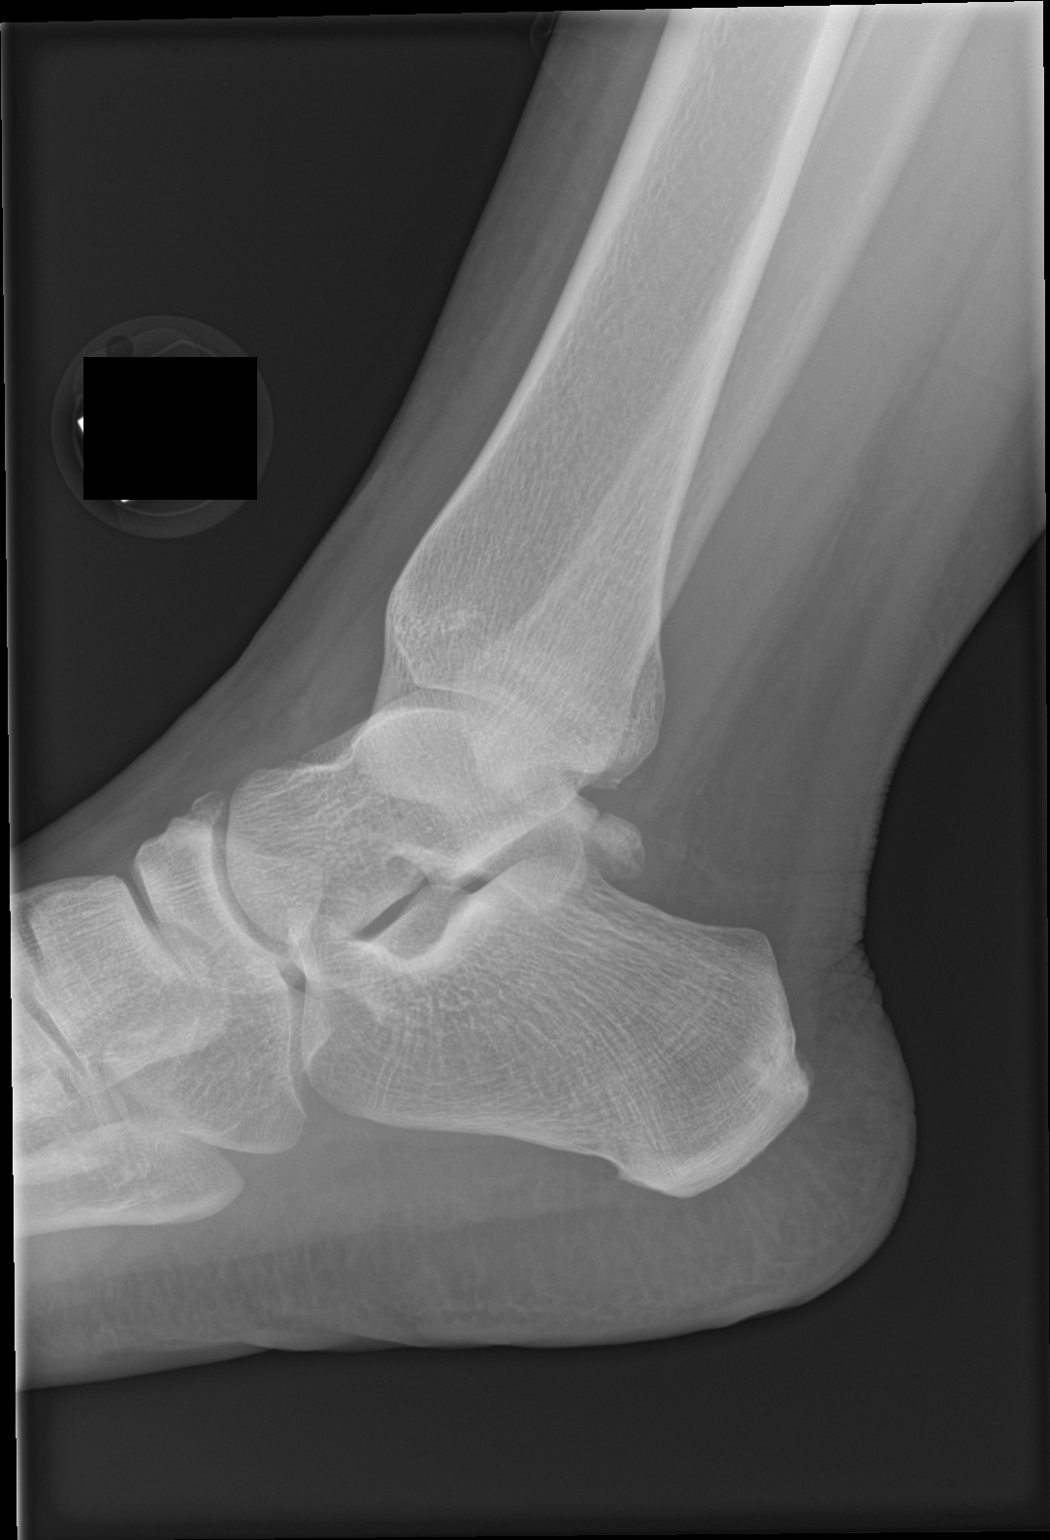

[3 of 3 positions shown; findings below may reference images not displayed]

FINDINGS: The ankle mortise is symmetric and intact. There is segmentation of
the posterior dorsal aspect of the navicular seen on lateral view,
likely a developmental secondary ossification center. Joint spaces
are preserved. No acute fracture or dislocation.
IMPRESSION: No acute abnormality.

## 2023-09-30 ENCOUNTER — Other Ambulatory Visit: Payer: Self-pay

## 2023-10-01 ENCOUNTER — Encounter: Payer: Self-pay | Admitting: Pharmacist

## 2023-10-01 ENCOUNTER — Other Ambulatory Visit: Payer: Self-pay

## 2023-10-06 ENCOUNTER — Other Ambulatory Visit: Payer: Self-pay

## 2023-10-13 ENCOUNTER — Encounter: Payer: Self-pay | Admitting: Family Medicine

## 2023-10-13 DIAGNOSIS — Z111 Encounter for screening for respiratory tuberculosis: Secondary | ICD-10-CM

## 2023-10-13 NOTE — Telephone Encounter (Signed)
If she has not had the TB test previously and desires to have the TB Gold test please go ahead and order thank you

## 2023-10-18 NOTE — Telephone Encounter (Signed)
Order placed at the front desk for pick TB gold

## 2023-10-27 DIAGNOSIS — Z111 Encounter for screening for respiratory tuberculosis: Secondary | ICD-10-CM | POA: Diagnosis not present

## 2023-10-29 LAB — QUANTIFERON-TB GOLD PLUS
QuantiFERON Mitogen Value: >10 [IU]/mL
QuantiFERON Nil Value: 0.02 [IU]/mL
QuantiFERON TB1 Ag Value: 0.02 [IU]/mL
QuantiFERON TB2 Ag Value: 0.02 [IU]/mL
QuantiFERON-TB Gold Plus: NEGATIVE

## 2023-11-03 ENCOUNTER — Telehealth: Payer: Commercial Managed Care - PPO | Admitting: Neurology

## 2023-11-08 ENCOUNTER — Other Ambulatory Visit: Payer: Self-pay | Admitting: Nurse Practitioner

## 2023-11-08 ENCOUNTER — Encounter: Payer: Self-pay | Admitting: Nurse Practitioner

## 2023-11-10 ENCOUNTER — Other Ambulatory Visit: Payer: Self-pay | Admitting: Nurse Practitioner

## 2023-11-10 MED ORDER — SERTRALINE HCL 50 MG PO TABS: 50.0000 mg | ORAL_TABLET | Freq: Every day | ORAL | 0 refills | Status: DC

## 2023-11-11 ENCOUNTER — Ambulatory Visit: Payer: Commercial Managed Care - PPO

## 2023-11-18 ENCOUNTER — Ambulatory Visit: Payer: Commercial Managed Care - PPO | Admitting: Obstetrics & Gynecology

## 2023-11-18 ENCOUNTER — Encounter: Payer: Self-pay | Admitting: Obstetrics & Gynecology

## 2023-11-18 ENCOUNTER — Ambulatory Visit: Payer: Commercial Managed Care - PPO

## 2023-11-18 VITALS — BP 138/85 | HR 69 | Ht 62.0 in | Wt 215.2 lb

## 2023-11-18 DIAGNOSIS — Z8673 Personal history of transient ischemic attack (TIA), and cerebral infarction without residual deficits: Secondary | ICD-10-CM

## 2023-11-18 DIAGNOSIS — N920 Excessive and frequent menstruation with regular cycle: Secondary | ICD-10-CM

## 2023-11-18 DIAGNOSIS — N946 Dysmenorrhea, unspecified: Secondary | ICD-10-CM | POA: Diagnosis not present

## 2023-11-18 DIAGNOSIS — Z30013 Encounter for initial prescription of injectable contraceptive: Secondary | ICD-10-CM

## 2023-11-18 MED ORDER — MEDROXYPROGESTERONE ACETATE 150 MG/ML IM SUSY
150.0000 mg | PREFILLED_SYRINGE | Freq: Once | INTRAMUSCULAR | Status: AC
Start: 2023-11-18 — End: 2023-11-18
  Administered 2023-11-18: 150 mg via INTRAMUSCULAR

## 2023-11-18 MED ORDER — MEDROXYPROGESTERONE ACETATE 150 MG/ML IM SUSP
150.0000 mg | INTRAMUSCULAR | 3 refills | Status: DC
Start: 2023-11-18 — End: 2024-02-09

## 2023-11-18 NOTE — Progress Notes (Signed)
PELVIC US TA/TV: heterogeneous anteverted uterus,EEC 3.2 mm,normal ovaries,ovaries appear mobile,no free fluid  Chaperone Peggy

## 2023-11-18 NOTE — Progress Notes (Signed)
GYN VISIT Patient name: Melissa Livingston MRN [id number]  Date of birth: 1996-10-07 Chief Complaint:   Follow-up (Discuss [place]; Needs Depo today)  History of Present Illness:   Melissa Livingston is a 27 y.o. [phone] female being seen today for the following concerns:  HMB: At her last visit, she was given Depot on Sept- she has not followed up since then.  With Depot she did not have a period until recently.  Her menses started 11/29 and has continued- typically changing regular pad every 2 hours.  Of note pt has h/o stroke and in the past has been on pills, Nexplanon and IUD.  She may desires another pregnancy- not sure  Denies abnormal discharge, itching or irritation.  Denies pelvic or abdominal pain.  She reports no other acute complaints  Patient's last menstrual period was 11/05/2023.    Review of Systems:   Pertinent items are noted in HPI Denies fever/chills, dizziness, headaches, visual disturbances, fatigue, shortness of breath, chest pain, abdominal pain, vomiting, denies problems with bowel movements, urination, or intercourse unless otherwise stated above.  Pertinent History Reviewed:   Past Surgical History:  Procedure Laterality Date   CESAREAN SECTION N/A 09/29/2014   Procedure: CESAREAN SECTION;  Surgeon: Tilda Burrow, MD;  Location: [facility];  Service: Obstetrics;  Laterality: N/A;   CESAREAN SECTION N/A 09/25/2016   Procedure: CESAREAN SECTION;  Surgeon: Tilda Burrow, MD;  Location: [facility] BIRTHING SUIT[facility];  Service: Obstetrics;  Laterality: N/A;   CESAREAN SECTION N/A 08/25/2020   Procedure: CESAREAN SECTION;  Surgeon: Lazaro Arms, MD;  Location: [facility] LD [facility]S;  Service: Obstetrics;  Laterality: N/A;   CHOLECYSTECTOMY N/A 03/07/2018   Procedure: LAPAROSCOPIC CHOLECYSTECTOMY;  Surgeon: Franky Macho, MD;  Location: [facility];  Service: General;  Laterality: N/A;   COLONOSCOPY WITH PROPOFOL N/A 08/02/2017   Dr. Jena Gauss: moderate internal hemorhoids, distal 5 cm of TI also appeared  normal   ESOPHAGOGASTRODUODENOSCOPY (EGD) WITH ESOPHAGEAL DILATION N/A 07/19/2013   Dr. Elmer Ramp appearing esophagus s/p dilation, normal D1 and D2, unremarkable path    ESOPHAGOGASTRODUODENOSCOPY (EGD) WITH PROPOFOL N/A 08/02/2017   Normal esophagus, small hiatal hernia, normal duodenum   none     WISDOM TOOTH EXTRACTION      Past Medical History:  Diagnosis Date   Anemia    Anxiety    Asthma    as child   Endometritis 01/11/2015   GERD (gastroesophageal reflux disease)    Headache    Hematochezia 05/08/2015   History of ovarian cyst 01/28/2015   IBS (irritable bowel syndrome)    Infection    UTI   Migraine headache    Nausea and vomiting 01/11/2015   Ovarian cyst 01/14/2015   Pelvic pain in female 01/11/2015   Pregnancy induced hypertension    with pregnancy   Pregnant 02/05/2016   Rectal bleeding 07/18/2013   Stroke [facility])    Reviewed problem list, medications and allergies. Physical Assessment:   Vitals:   11/18/23 1415  BP: 138/85  Pulse: 69  Weight: 215 lb 3.2 oz (97.6 kg)  Height: 5\' 2"  (1.575 m)  Body mass index is 39.36 kg/m.       Physical Examination:   General appearance: alert, well appearing, and in no distress  Psych: mood appropriate, normal affect  Skin: warm & dry   Cardiovascular: normal heart rate noted  Respiratory: normal respiratory effort, no distress  Pelvic: examination not indicated  Extremities: no edema   Chaperone: N/A  Assessment & Plan:  1) HMB -Reviewed all progesterone only options -As mentioned patient is not yet interested in permanent sterilization -It is unclear as to why patient missed her Depot we will plan to restart today and prescription sent in   No orders of the defined types were placed in this encounter.   Return in about 3 months (around 02/16/2024) for Depot every 3 mos.   [name], DO Attending Obstetrician & Gynecologist, [facility] for [employer], [facility]

## 2023-11-19 ENCOUNTER — Ambulatory Visit: Payer: Commercial Managed Care - PPO | Admitting: Family Medicine

## 2023-11-30 ENCOUNTER — Ambulatory Visit: Payer: Commercial Managed Care - PPO | Admitting: Family Medicine

## 2023-12-20 ENCOUNTER — Ambulatory Visit
Admission: EM | Admit: 2023-12-20 | Discharge: 2023-12-20 | Disposition: A | Discharge status: Home / Self Care | Payer: Commercial Managed Care - PPO | Attending: Nurse Practitioner | Admitting: Nurse Practitioner

## 2023-12-20 DIAGNOSIS — R399 Unspecified symptoms and signs involving the genitourinary system: Secondary | ICD-10-CM | POA: Insufficient documentation

## 2023-12-20 DIAGNOSIS — Z87448 Personal history of other diseases of urinary system: Secondary | ICD-10-CM | POA: Diagnosis not present

## 2023-12-20 DIAGNOSIS — N760 Acute vaginitis: Secondary | ICD-10-CM | POA: Diagnosis not present

## 2023-12-20 LAB — POCT URINALYSIS DIP (MANUAL ENTRY)
Glucose, UA: NEGATIVE mg/dL
Nitrite, UA: NEGATIVE
Protein Ur, POC: 100 mg/dL — AB
Spec Grav, UA: 1.02
Urobilinogen, UA: 1 U/dL
pH, UA: 7

## 2023-12-20 MED ORDER — SULFAMETHOXAZOLE-TRIMETHOPRIM 800-160 MG PO TABS: 1.0000 | ORAL_TABLET | Freq: Two times a day (BID) | ORAL | 0 refills | Status: AC

## 2023-12-20 MED ORDER — FLUCONAZOLE 150 MG PO TABS: ORAL_TABLET | ORAL | 0 refills | Status: DC

## 2023-12-20 NOTE — ED Triage Notes (Signed)
 Urinary frequency, urgency, lower abdominal and back pressure and pain with vaginal itching and discharge x 1 week.

## 2023-12-20 NOTE — Discharge Instructions (Addendum)
-  A urine culture iand cytology swab are pending. You will be contacted if the results of the pending tests are abnormal. You will also have also have access to your results via MyChart. -Take medications as prescribed. -Increase fluids.Try to drink at least 8 to 10 8oz glasses of water  daily. -Ibuprofen  or Tylenol  for pain, fever, or general discomfort. -Develop a toileting schedule that will allow you to toilet at least every 2 hours. -Avoid caffeine  to include tea, soda, and coffee. -If sexually active, void at least 15 to 20 minutes after sexual intercourse. -Follow-up in the emergency department if you develop fever, chills, worsening abdominal pain, or other concerns.

## 2023-12-20 NOTE — ED Provider Notes (Signed)
 [facility]    CSN: [id number] Arrival date & time: 12/20/23  0814      History   Chief Complaint Chief Complaint  Patient presents with   Urinary Frequency    HPI Melissa Livingston is a 28 y.o. female.   The history is provided by the patient.   Patient presents with a 1 week history of urinary frequency, urgency, lower abdominal pain, low back pain, vaginal itching, vaginal odor, and vaginal discharge.  Patient denies fever, chills, nausea, vomiting, hematuria, dysuria, decreased urine stream, or flank pain.  Patient reports prior history of pyelonephritis.  Patient is requesting screening for STI.  She reports 1 female partner in the past 90 days.  Past Medical History:  Diagnosis Date   Anemia    Anxiety    Asthma    as child   Endometritis 01/11/2015   GERD (gastroesophageal reflux disease)    Headache    Hematochezia 05/08/2015   History of ovarian cyst 01/28/2015   IBS (irritable bowel syndrome)    Infection    UTI   Migraine headache    Nausea and vomiting 01/11/2015   Ovarian cyst 01/14/2015   Pelvic pain in female 01/11/2015   Pregnancy induced hypertension    with pregnancy   Pregnant 02/05/2016   Rectal bleeding 07/18/2013   Stroke [facility])     Patient Active Problem List   Diagnosis Date Noted   Menorrhagia with regular cycle 08/06/2023   Encounter for well woman exam with routine gynecological exam 08/06/2023   Vaginal itching 08/06/2023   Screening examination for STD (sexually transmitted disease) 08/06/2023   General counseling and advice for contraceptive management 08/06/2023   Menorrhagia with irregular cycle 07/16/2023   Iron  deficiency anemia due to chronic blood loss 07/16/2023   Situational stress 07/16/2023   Chronic migraine without aura without status migrainosus, not intractable 11/23/2022   Abdominal wall pain in suprapubic region 07/28/2022   Dyspareunia, female 07/28/2022   Encounter for gynecological examination with  Papanicolaou smear of cervix 07/28/2022   Sleep disturbance 06/05/2021   Nexplanon  in place 01/22/2021   Weight gain 01/22/2021   Frequent headaches 01/22/2021   Pregnancy examination or test, negative result 01/22/2021   Swelling of both ankles 01/22/2021   Short of breath on exertion 01/22/2021   Nexplanon  insertion 08/26/2020   Sepsis (HCC) 07/25/2020   Pneumonia due to COVID-19 virus 07/25/2020   Chronic hypertension 03/21/2020   Headache disorder 01/01/2019   Chronic pain of both knees 08/25/2018   Constipation 08/25/2018   Acute left-sided low back pain with left-sided sciatica 01/09/2017   Morbid obesity (HCC) 01/09/2017   S/P cesarean section 03/04/2016   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   GERD (gastroesophageal reflux disease) 07/18/2013   Irritable bowel syndrome 07/03/2013   Depression with anxiety 07/03/2013   Asthma 03/27/2013    Past Surgical History:  Procedure Laterality Date   CESAREAN SECTION N/A 09/29/2014   Procedure: CESAREAN SECTION;  Surgeon: Norleen Edsel GAILS, MD;  Location: [facility];  Service: Obstetrics;  Laterality: N/A;   CESAREAN SECTION N/A 09/25/2016   Procedure: CESAREAN SECTION;  Surgeon: Norleen GAILS Edsel, MD;  Location: [facility];  Service: Obstetrics;  Laterality: N/A;   CESAREAN SECTION N/A 08/25/2020   Procedure: CESAREAN SECTION;  Surgeon: Jayne Vonn DEL, MD;  Location: [facility] LD ORS;  Service: Obstetrics;  Laterality: N/A;   CHOLECYSTECTOMY N/A 03/07/2018   Procedure: LAPAROSCOPIC CHOLECYSTECTOMY;  Surgeon: Mavis Anes, MD;  Location: [facility];  Service: General;  Laterality: N/A;   COLONOSCOPY WITH PROPOFOL  N/A 08/02/2017   Dr. Shaaron: moderate internal hemorhoids, distal 5 cm of TI also appeared normal   ESOPHAGOGASTRODUODENOSCOPY (EGD) WITH ESOPHAGEAL DILATION N/A 07/19/2013   Dr. Charley appearing esophagus s/p dilation, normal D1 and D2, unremarkable path    ESOPHAGOGASTRODUODENOSCOPY (EGD) WITH PROPOFOL  N/A 08/02/2017    Normal esophagus, small hiatal hernia, normal duodenum   none     WISDOM TOOTH EXTRACTION      OB History     Gravida  3   Para  3   Term  3   Preterm      AB      Living  3      SAB      IAB      Ectopic      Multiple  0   Live Births  3            Home Medications    Prior to Admission medications   Medication Sig Start Date End Date Taking? Authorizing Provider  azelastine  (ASTELIN ) 0.1 % nasal spray Place 2 sprays into both nostrils 2 (two) times daily. 04/06/23  Yes Luking, Glendia LABOR, MD  fluconazole  (DIFLUCAN ) 150 MG tablet Take 1 tablet by mouth today. May repeat every 72 hours up to 2 additional doses for continued symptoms. 12/20/23  Yes Leath-Warren, Etta PARAS, NP  fluticasone  (FLONASE ) 50 MCG/ACT nasal spray Place 2 sprays into both nostrils daily. 03/10/23  Yes Kennyth Domino, FNP  sertraline  (ZOLOFT ) 50 MG tablet Take 1 tablet (50 mg total) by mouth daily. 11/10/23  Yes Mauro Elveria BROCKS, NP  sulfamethoxazole -trimethoprim  (BACTRIM  DS) 800-160 MG tablet Take 1 tablet by mouth 2 (two) times daily for 7 days. 12/20/23 12/27/23 Yes Leath-Warren, Etta PARAS, NP  acetaminophen  (TYLENOL ) 500 MG tablet Take 2 tablets (1,000 mg total) by mouth every 6 (six) hours as needed for mild pain, moderate pain or headache. 08/26/20   Cecilia Bernarda BROCKS, MD  albuterol  (VENTOLIN  HFA) 108 (90 Base) MCG/ACT inhaler Inhale 2 puffs into the lungs every 6 (six) hours as needed for wheezing or shortness of breath. 06/23/23   Alphonsa Glendia LABOR, MD  aspirin  EC 81 MG tablet Take 81 mg by mouth daily. Swallow whole.    [provider]  Cetirizine  HCl (ZYRTEC  PO) Take by mouth.    [provider]  Eptinezumab -jjmr (VYEPTI  IV) Inject into the vein every 3 (three) months.    [provider]  medroxyPROGESTERone  (DEPO-PROVERA ) 150 MG/ML injection Inject 150 mg into the muscle every 3 (three) months.    [provider]  medroxyPROGESTERone  (DEPO-PROVERA ) 150  MG/ML injection Inject 1 mL (150 mg total) into the muscle every 3 (three) months. 11/18/23 02/16/24  Ozan, Jennifer, DO  olopatadine  (PATANOL) 0.1 % ophthalmic solution Place 1 drop into both eyes 2 (two) times daily. 03/10/23   Kennyth Domino, FNP  ondansetron  (ZOFRAN -ODT) 4 MG disintegrating tablet Take 1-2 tablets (4-8 mg total) by mouth every 8 (eight) hours as needed. 11/23/22   Ines Onetha NOVAK, MD  Ubrogepant  (UBRELVY ) 100 MG TABS Take 1 tablet (100 mg total) by mouth every 2 (two) hours as needed. Maximum 200mg  a day. 06/24/23   Ines Onetha NOVAK, MD    Family History Family History  Problem Relation Age of Onset   Multiple sclerosis Mother    Migraines Mother    Hypertension Father    Hypertension Paternal Aunt    Heart disease Paternal Uncle  Diabetes Paternal Uncle    Hypertension Paternal Uncle    Heart disease Maternal Grandfather    Cancer Other        father's aunt had breast cancer   Colon cancer Other        maternal great uncle, age greater than 15   Crohn's disease Other        couple of family members on father's side of family    Social History Social History   Tobacco Use   Smoking status: Former    Types: Cigars   Smokeless tobacco: Never  Vaping Use   Vaping status: Never Used  Substance Use Topics   Alcohol use: Yes   Drug use: No     Allergies   Adhesive [tape], Lorabid [loracarbef], Latex, and Orange fruit [citrus]   Review of Systems Review of Systems Per HPI  Physical Exam Triage Vital Signs ED Triage Vitals [12/20/23 0828]  Encounter Vitals Group     BP 120/79     Systolic BP Percentile      Diastolic BP Percentile      Pulse Rate (!) 106     Resp 16     Temp 98.4 F (36.9 C)     Temp Source Oral     SpO2 95 %     Weight      Height      Head Circumference      Peak Flow      Pain Score 5     Pain Loc      Pain Education      Exclude from Growth Chart    No data found.  Updated Vital Signs BP 120/79 (BP Location:  Right Arm)   Pulse (!) 106   Temp 98.4 F (36.9 C) (Oral)   Resp 16   LMP 11/07/2023 (Approximate)   SpO2 95%   Visual Acuity Right Eye Distance:   Left Eye Distance:   Bilateral Distance:    Right Eye Near:   Left Eye Near:    Bilateral Near:     Physical Exam Vitals and nursing note reviewed.  Constitutional:      General: She is not in acute distress.    Appearance: Normal appearance.  HENT:     Head: Normocephalic.  Eyes:     Extraocular Movements: Extraocular movements intact.     Conjunctiva/sclera: Conjunctivae normal.     Pupils: Pupils are equal, round, and reactive to light.  Cardiovascular:     Rate and Rhythm: Normal rate and regular rhythm.     Pulses: Normal pulses.     Heart sounds: Normal heart sounds.  Pulmonary:     Effort: Pulmonary effort is normal. No respiratory distress.     Breath sounds: Normal breath sounds. No stridor. No wheezing, rhonchi or rales.  Abdominal:     General: Bowel sounds are normal.     Palpations: Abdomen is soft.     Tenderness: There is abdominal tenderness in the suprapubic area. There is right CVA tenderness. There is no left CVA tenderness.  Musculoskeletal:     Cervical back: Normal range of motion.  Lymphadenopathy:     Cervical: No cervical adenopathy.  Skin:    General: Skin is warm and dry.  Neurological:     General: No focal deficit present.     Mental Status: She is alert and oriented to person, place, and time.  Psychiatric:        Mood and Affect: Mood normal.  Behavior: Behavior normal.      UC Treatments / Results  Labs (all labs ordered are listed, but only abnormal results are displayed) Labs Reviewed  POCT URINALYSIS DIP (MANUAL ENTRY) - Abnormal; Notable for the following components:      Result Value   Bilirubin, UA small (*)    Ketones, POC UA trace (5) (*)    Blood, UA trace-intact (*)    Protein Ur, POC =100 (*)    Leukocytes, UA Large (3+) (*)    All other components within  normal limits  URINE CULTURE  CERVICOVAGINAL ANCILLARY ONLY    EKG   Radiology No results found.  Procedures Procedures (including critical care time)  Medications Ordered in UC Medications - No data to display  Initial Impression / Assessment and Plan / UC Course  I have reviewed the triage vital signs and the nursing notes.  Pertinent labs & imaging results that were available during my care of the patient were reviewed by me and considered in my medical decision making (see chart for details).  On exam, patient with right CVA tenderness.  Urinalysis shows large leukocytes, protein, and blood, cannot rule out pyelonephritis at this time.  Patient's vital signs are stable, although she is mildly tachycardic.  Will treat empirically with Bactrim  DS 800/160 mg tablets twice daily for the next 7 days.  Urine culture is pending.  For vaginal symptoms, fluconazole  150 mg tablets was also prescribed pending the cytology swab.  Supportive care recommendations were provided and discussed with the patient to include fluids, rest, over-the-counter analgesics, developing a toileting schedule, and avoiding caffeine .  Discussed indications with the patient regarding ER follow-up.  Patient was in agreement with this plan of care and verbalizes understanding.  All questions were answered.  Patient stable for discharge.  Final Clinical Impressions(s) / UC Diagnoses   Final diagnoses:  UTI symptoms  Acute vaginitis  History of pyelonephritis     Discharge Instructions      -A urine culture iand cytology swab are pending. You will be contacted if the results of the pending tests are abnormal. You will also have also have access to your results via MyChart. -Take medications as prescribed. -Increase fluids.Try to drink at least 8 to 10 8oz glasses of water  daily. -Ibuprofen  or Tylenol  for pain, fever, or general discomfort. -Develop a toileting schedule that will allow you to toilet at least  every 2 hours. -Avoid caffeine  to include tea, soda, and coffee. -If sexually active, void at least 15 to 20 minutes after sexual intercourse. -Follow-up in the emergency department if you develop fever, chills, worsening abdominal pain, or other concerns.      ED Prescriptions     Medication Sig Dispense Auth. Provider   sulfamethoxazole -trimethoprim  (BACTRIM  DS) 800-160 MG tablet Take 1 tablet by mouth 2 (two) times daily for 7 days. 14 tablet Leath-Warren, Etta PARAS, NP   fluconazole  (DIFLUCAN ) 150 MG tablet Take 1 tablet by mouth today. May repeat every 72 hours up to 2 additional doses for continued symptoms. 3 tablet Leath-Warren, Etta PARAS, NP      PDMP not reviewed this encounter.   Gilmer Etta PARAS, NP 12/20/23 [phone]

## 2023-12-21 ENCOUNTER — Telehealth (HOSPITAL_COMMUNITY): Payer: Self-pay

## 2023-12-21 LAB — URINE CULTURE: Culture: <10000 — AB

## 2023-12-21 LAB — CERVICOVAGINAL ANCILLARY ONLY
Bacterial Vaginitis (gardnerella): POSITIVE — AB
Candida Glabrata: NEGATIVE
Candida Vaginitis: POSITIVE — AB
Chlamydia: NEGATIVE
Comment: NEGATIVE
Comment: NEGATIVE
Comment: NEGATIVE
Comment: NEGATIVE
Comment: NEGATIVE
Comment: NORMAL
Neisseria Gonorrhea: NEGATIVE
Trichomonas: NEGATIVE

## 2023-12-21 MED ORDER — METRONIDAZOLE 500 MG PO TABS
500.0000 mg | ORAL_TABLET | Freq: Two times a day (BID) | ORAL | 0 refills | Status: AC
Start: 2023-12-21 — End: 2023-12-28

## 2023-12-21 NOTE — Telephone Encounter (Signed)
 Per protocol, pt requires tx with metronidazole. Rx sent to pharmacy on file.

## 2024-01-20 ENCOUNTER — Telehealth: Payer: Commercial Managed Care - PPO | Admitting: Physician Assistant

## 2024-01-20 DIAGNOSIS — R6889 Other general symptoms and signs: Secondary | ICD-10-CM

## 2024-01-20 MED ORDER — OSELTAMIVIR PHOSPHATE 75 MG PO CAPS: 75.0000 mg | ORAL_CAPSULE | Freq: Two times a day (BID) | ORAL | 0 refills | Status: DC

## 2024-01-20 NOTE — Progress Notes (Signed)

## 2024-01-20 NOTE — Progress Notes (Signed)
Message sent to patient requesting further input regarding current symptoms. Awaiting patient response.

## 2024-01-20 NOTE — Progress Notes (Signed)
I have spent 5 minutes in review of e-visit questionnaire, review and updating patient chart, medical decision making and response to patient.   Piedad Climes, PA-C

## 2024-02-08 ENCOUNTER — Ambulatory Visit: Payer: Commercial Managed Care - PPO

## 2024-02-09 ENCOUNTER — Encounter: Payer: Self-pay | Admitting: Obstetrics & Gynecology

## 2024-02-09 ENCOUNTER — Other Ambulatory Visit: Payer: Self-pay | Admitting: Obstetrics & Gynecology

## 2024-02-09 DIAGNOSIS — Z30013 Encounter for initial prescription of injectable contraceptive: Secondary | ICD-10-CM

## 2024-02-09 MED ORDER — MEDROXYPROGESTERONE ACETATE 150 MG/ML IM SUSP: 150.0000 mg | INTRAMUSCULAR | 0 refills | Status: DC

## 2024-02-09 NOTE — Progress Notes (Signed)
 [facility]  [name], DO Attending Obstetrician & Gynecologist, [facility] for [employer], [facility]

## 2024-02-10 ENCOUNTER — Ambulatory Visit: Payer: Commercial Managed Care - PPO

## 2024-02-18 ENCOUNTER — Ambulatory Visit: Payer: Commercial Managed Care - PPO | Admitting: Nurse Practitioner

## 2024-02-18 VITALS — BP 120/79 | HR 96 | Temp 98.6°F | Ht 62.0 in | Wt 232.6 lb

## 2024-02-18 DIAGNOSIS — F418 Other specified anxiety disorders: Secondary | ICD-10-CM

## 2024-02-18 DIAGNOSIS — G479 Sleep disorder, unspecified: Secondary | ICD-10-CM | POA: Diagnosis not present

## 2024-02-18 DIAGNOSIS — F4323 Adjustment disorder with mixed anxiety and depressed mood: Secondary | ICD-10-CM | POA: Diagnosis not present

## 2024-02-18 MED ORDER — SERTRALINE HCL 50 MG PO TABS: 50.0000 mg | ORAL_TABLET | Freq: Every day | ORAL | 0 refills | Status: DC

## 2024-02-18 MED ORDER — HYDROXYZINE PAMOATE 25 MG PO CAPS: ORAL_CAPSULE | ORAL | 2 refills | Status: DC

## 2024-02-18 NOTE — Progress Notes (Signed)
 Subjective:    Patient ID: Melissa Livingston, female    DOB: 02/28/1996, 28 y.o.   MRN: [id number]  HPI Presents today for a medication follow-up after starting Zoloft. She is still continuing to feel overwhelmed. She has three children, is working and going to school full-time. Was prescribed the medication in December, and did not start the medication until January, and ran out of the medication at the end of February. Felt improvements on the medication, and denies any side effects. Has struggled to get an appointment with school schedule. Has trouble falling asleep due to racing thoughts, and reports that she only gets 5 hours of sleep per night. Has a good support system at home to help with her children. Is unable to exercise or take time for herself. Denies suicidal thoughts or ideation. Has cut back on the number of classes this semester.   Review of Systems  Constitutional:  Positive for fatigue. Negative for activity change, appetite change and fever.  Respiratory:  Negative for choking, chest tightness, shortness of breath and wheezing.   Cardiovascular:  Negative for chest pain and palpitations.  Psychiatric/Behavioral:  Positive for sleep disturbance. Negative for self-injury and suicidal ideas. The patient is nervous/anxious.       Objective:   Physical Exam Vitals and nursing note reviewed.  Constitutional:      General: She is not in acute distress.    Appearance: Normal appearance. She is not ill-appearing.  Cardiovascular:     Rate and Rhythm: Normal rate and regular rhythm.     Heart sounds: Normal heart sounds, S1 normal and S2 normal. No murmur heard. Neurological:     Mental Status: She is alert.  Psychiatric:        Attention and Perception: Attention normal.        Mood and Affect: Mood is anxious and depressed. Affect is tearful.        Speech: Speech normal.        Behavior: Behavior normal.        Thought Content: Thought content normal. Thought content does not  include suicidal ideation. Thought content does not include suicidal plan.        Cognition and Memory: Cognition normal.        Judgment: Judgment normal.    Vitals:   02/18/24 1049  BP: 120/79  Pulse: 96  Temp: 98.6 F (37 C)  Height: 5\' 2"  (1.575 m)  Weight: 232 lb 9.6 oz (105.5 kg)  SpO2: 100%  BMI (Calculated): 42.53       02/18/2024   10:53 AM 08/06/2023    9:27 AM 07/16/2023    3:22 PM 04/06/2023    9:59 AM 09/21/2022    8:53 AM  Depression screen PHQ 2/9  Decreased Interest 1 0 0 0 2  Down, Depressed, Hopeless 2 0 0 1 1  PHQ - 2 Score 3 0 0 1 3  Altered sleeping 3 2 3 1 2   Tired, decreased energy 3 2 3 1 2   Change in appetite 3 1 0 0   Feeling bad or failure about yourself  0 0 0 0 0  Trouble concentrating 3 0 3 0 1  Moving slowly or fidgety/restless 2 1 3 1 2   Suicidal thoughts 0 0 0 0 0  PHQ-9 Score 17 6 12 4 10   Difficult doing work/chores Very difficult   Not difficult at all Somewhat difficult       02/18/2024   10:53 AM 08/06/2023  9:27 AM 07/16/2023    3:22 PM 04/06/2023    9:59 AM  GAD 7 : Generalized Anxiety Score  Nervous, Anxious, on Edge 2 1 1 1   Control/stop worrying 1 0 0 1  Worry too much - different things 1 1 0 0  Trouble relaxing 1 0 2 0  Restless 2 0 0 0  Easily annoyed or irritable 2 0 2 1  Afraid - awful might happen 0 0 0 0  Total GAD 7 Score 9 2 5 3   Anxiety Difficulty Very difficult  Somewhat difficult Not difficult at all      Assessment & Plan:  1. Depression with anxiety (Primary)  - Restart Sertraline (ZOLOFT) 50 MG tablet; Take 1 tablet (50 mg total) by mouth daily.  Dispense: 90 tablet; Refill: 0 - Ambulatory referral to Psychology -Advised patient to take time for self-care. Encouraged part time classes if possible due to the level of stress.   2. Sleep disturbance  - hydrOXYzine (VISTARIL) 25 MG capsule; Take 1 cap po at bedtime PRN sleep.  Dispense: 30 capsule; Refill: 2   Return in about 3 months (around 05/20/2024).   Call back sooner if needed.   I have seen and examined this patient alongside the NP student. I have reviewed and verified the student note and agree with the assessment and plan.  Sherie Don, FNP

## 2024-02-18 NOTE — Patient Instructions (Signed)
 Allegra or Claritin in the AM, stop taking the Cetrizine when taking Hydroxyzine

## 2024-02-19 ENCOUNTER — Encounter: Payer: Self-pay | Admitting: Nurse Practitioner

## 2024-02-19 DIAGNOSIS — F4323 Adjustment disorder with mixed anxiety and depressed mood: Secondary | ICD-10-CM | POA: Insufficient documentation

## 2024-04-18 ENCOUNTER — Encounter: Payer: Self-pay | Admitting: Nurse Practitioner

## 2024-04-18 ENCOUNTER — Telehealth: Admitting: Nurse Practitioner

## 2024-04-18 DIAGNOSIS — G5603 Carpal tunnel syndrome, bilateral upper limbs: Secondary | ICD-10-CM | POA: Diagnosis not present

## 2024-04-18 DIAGNOSIS — M778 Other enthesopathies, not elsewhere classified: Secondary | ICD-10-CM

## 2024-04-18 MED ORDER — MELOXICAM 15 MG PO TABS: 15.0000 mg | ORAL_TABLET | Freq: Every day | ORAL | 0 refills | Status: DC

## 2024-04-18 MED ORDER — WRIST BRACE MISC: 0 refills | Status: AC

## 2024-04-18 NOTE — Progress Notes (Signed)
 Virtual Visit via Video Note  I connected with Melissa Livingston on 04/18/24 at  9:00 AM EDT by a video enabled telemedicine application and verified that I am speaking with the correct person using two identifiers.  Location: Patient: home Provider: home   I discussed the limitations of evaluation and management by telemedicine and the availability of in person appointments. The patient expressed understanding and agreed to proceed.  History of Present Illness: Presents for complaints of pain and swelling in both hands and wrist for the past month.  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  Pain has been waking her up from sleep.  Having numbness tingling and pain in the wrist and hands.  Slight swelling especially on the left side.  Note patient is left-handed.  Difficulty lifting due to the pain.  Slight weakness and numbness at times.  States when she is driving her hand will go to sleep and she has to switch hands.  No relief with OTC Tylenol  or ibuprofen .  No specific history of injury.    Observations/Objective: NAD.  Alert, oriented.  Can perform full active ROM of the wrist with tenderness noted in all movements.  Phalen and Tinel's positive.  Mild generalized edema noted in both hands more on the left.  Assessment and Plan: Problem List Items Addressed This Visit       Nervous and Auditory   Bilateral carpal tunnel syndrome     Musculoskeletal and Integument   Wrist tendonitis - Primary   Meds ordered this encounter  Medications   meloxicam  (MOBIC ) 15 MG tablet    Sig: Take 1 tablet (15 mg total) by mouth daily. Prn pain; do not take with OTC NSAIDs such as Ibuprofen     Dispense:  30 tablet    Refill:  0    Supervising Provider:   Charlotta Cook A [[id number]]   Misc. Devices (WRIST BRACE) MISC    Sig: Apply to both wrists as directed for wrist pain    Dispense:  2 each    Refill:  0    Diagnoses: M77.8 and G56.03     Supervising Provider:   Charlotta Cook A [[id number]]     Follow Up Instructions: Start meloxicam  as directed as needed wrist pain. Prescription sent in for bilateral wrist braces.  Advised to wear is much as possible including nighttime. Also recommend OTC topicals as directed. Patient to call back in 2 weeks if no improvement in her symptoms or if she is unable to tolerate wearing braces.   I discussed the assessment and treatment plan with the patient. The patient was provided an opportunity to ask questions and all were answered. The patient agreed with the plan and demonstrated an understanding of the instructions.   The patient was advised to call back or seek an in-person evaluation if the symptoms worsen or if the condition fails to improve as anticipated.  I provided 15 minutes of non-face-to-face time during this encounter.   Derenda Flax, NP

## 2024-05-04 ENCOUNTER — Encounter: Payer: Self-pay | Admitting: Nurse Practitioner

## 2024-05-08 ENCOUNTER — Other Ambulatory Visit: Payer: Self-pay | Admitting: Nurse Practitioner

## 2024-05-08 DIAGNOSIS — G5603 Carpal tunnel syndrome, bilateral upper limbs: Secondary | ICD-10-CM

## 2024-05-08 DIAGNOSIS — M778 Other enthesopathies, not elsewhere classified: Secondary | ICD-10-CM

## 2024-05-17 ENCOUNTER — Other Ambulatory Visit (HOSPITAL_COMMUNITY): Payer: Self-pay

## 2024-05-18 ENCOUNTER — Other Ambulatory Visit (HOSPITAL_COMMUNITY): Payer: Self-pay

## 2024-05-18 MED ORDER — MEDROXYPROGESTERONE ACETATE 150 MG/ML IM SUSY
1.0000 mL | PREFILLED_SYRINGE | INTRAMUSCULAR | 0 refills | Status: DC
Start: 2024-02-09 — End: 2024-07-21
  Filled 2024-05-18: qty 1, 90d supply, fill #0

## 2024-05-19 ENCOUNTER — Other Ambulatory Visit: Payer: Self-pay | Admitting: Family Medicine

## 2024-05-19 ENCOUNTER — Other Ambulatory Visit: Payer: Self-pay | Admitting: Neurology

## 2024-05-19 DIAGNOSIS — G43709 Chronic migraine without aura, not intractable, without status migrainosus: Secondary | ICD-10-CM

## 2024-05-19 DIAGNOSIS — R051 Acute cough: Secondary | ICD-10-CM

## 2024-05-20 ENCOUNTER — Ambulatory Visit
Admission: EM | Admit: 2024-05-20 | Discharge: 2024-05-20 | Disposition: A | Discharge status: Home / Self Care | Attending: Family Medicine | Admitting: Family Medicine

## 2024-05-20 ENCOUNTER — Other Ambulatory Visit: Payer: Self-pay

## 2024-05-20 ENCOUNTER — Encounter: Payer: Self-pay | Admitting: Emergency Medicine

## 2024-05-20 DIAGNOSIS — M778 Other enthesopathies, not elsewhere classified: Secondary | ICD-10-CM | POA: Diagnosis not present

## 2024-05-20 MED ORDER — DEXAMETHASONE SODIUM PHOSPHATE 10 MG/ML IJ SOLN
10.0000 mg | Freq: Once | INTRAMUSCULAR | Status: AC
  Administered 2024-05-20: 10 mg via INTRAMUSCULAR

## 2024-05-20 MED ORDER — TIZANIDINE HCL 4 MG PO CAPS: 4.0000 mg | ORAL_CAPSULE | Freq: Three times a day (TID) | ORAL | 0 refills | Status: DC | PRN

## 2024-05-20 NOTE — ED Provider Notes (Signed)
 [facility]    CSN: [id number] Arrival date & time: 05/20/24  1459      History   Chief Complaint Chief Complaint  Patient presents with   Wrist Pain    HPI Jackye OZELLE BRUBACHER is a 28 y.o. female.   Presenting today with 1 month history of left radial wrist pain, clicking with range of motion, stiffness, spasm.  Denies any known injury, swelling, discoloration, also range of motion but does have numbness and tingling in certain positions.  So far has tried meloxicam , Tylenol  with minimal relief.    Past Medical History:  Diagnosis Date   Anemia    Anxiety    Asthma    as child   Endometritis 01/11/2015   GERD (gastroesophageal reflux disease)    Headache    Hematochezia 05/08/2015   History of ovarian cyst 01/28/2015   IBS (irritable bowel syndrome)    Infection    UTI   Migraine headache    Nausea and vomiting 01/11/2015   Ovarian cyst 01/14/2015   Pelvic pain in female 01/11/2015   Pregnancy induced hypertension    with pregnancy   Pregnant 02/05/2016   Rectal bleeding 07/18/2013   Stroke [facility])     Patient Active Problem List   Diagnosis Date Noted   Wrist tendonitis 04/18/2024   Bilateral carpal tunnel syndrome 04/18/2024   Adjustment disorder with mixed anxiety and depressed mood 02/19/2024   Menorrhagia with regular cycle 08/06/2023   Encounter for well woman exam with routine gynecological exam 08/06/2023   Vaginal itching 08/06/2023   Screening examination for STD (sexually transmitted disease) 08/06/2023   General counseling and advice for contraceptive management 08/06/2023   Menorrhagia with irregular cycle 07/16/2023   Iron  deficiency anemia due to chronic blood loss 07/16/2023   Situational stress 07/16/2023   Chronic migraine without aura without status migrainosus, not intractable 11/23/2022   Abdominal wall pain in suprapubic region 07/28/2022   Dyspareunia, female 07/28/2022   Encounter for gynecological examination with  Papanicolaou smear of cervix 07/28/2022   Sleep disturbance 06/05/2021   Nexplanon  in place 01/22/2021   Weight gain 01/22/2021   Frequent headaches 01/22/2021   Pregnancy examination or test, negative result 01/22/2021   Swelling of both ankles 01/22/2021   Short of breath on exertion 01/22/2021   Nexplanon  insertion 08/26/2020   Sepsis (HCC) 07/25/2020   Pneumonia due to COVID-19 virus 07/25/2020   Chronic hypertension 03/21/2020   Headache disorder 01/01/2019   Chronic pain of both knees 08/25/2018   Constipation 08/25/2018   Acute left-sided low back pain with left-sided sciatica 01/09/2017   Morbid obesity (HCC) 01/09/2017   S/P cesarean section 03/04/2016   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   GERD (gastroesophageal reflux disease) 07/18/2013   Irritable bowel syndrome 07/03/2013   Depression with anxiety 07/03/2013   Asthma 03/27/2013    Past Surgical History:  Procedure Laterality Date   CESAREAN SECTION N/A 09/29/2014   Procedure: CESAREAN SECTION;  Surgeon: Albino Hum, MD;  Location: [facility];  Service: Obstetrics;  Laterality: N/A;   CESAREAN SECTION N/A 09/25/2016   Procedure: CESAREAN SECTION;  Surgeon: Albino Hum, MD;  Location: [facility];  Service: Obstetrics;  Laterality: N/A;   CESAREAN SECTION N/A 08/25/2020   Procedure: CESAREAN SECTION;  Surgeon: Wendelyn Halter, MD;  Location: [facility];  Service: Obstetrics;  Laterality: N/A;   CHOLECYSTECTOMY N/A 03/07/2018   Procedure: LAPAROSCOPIC CHOLECYSTECTOMY;  Surgeon: Alanda Allegra, MD;  Location: [facility];  Service: General;  Laterality: N/A;   COLONOSCOPY WITH PROPOFOL  N/A 08/02/2017   Dr. Riley Cheadle: moderate internal hemorhoids, distal 5 cm of TI also appeared normal   ESOPHAGOGASTRODUODENOSCOPY (EGD) WITH ESOPHAGEAL DILATION N/A 07/19/2013   Dr. Davonna Estes appearing esophagus s/p dilation, normal D1 and D2, unremarkable path    ESOPHAGOGASTRODUODENOSCOPY (EGD) WITH PROPOFOL  N/A 08/02/2017    Normal esophagus, small hiatal hernia, normal duodenum   none     WISDOM TOOTH EXTRACTION      OB History     Gravida  3   Para  3   Term  3   Preterm      AB      Living  3      SAB      IAB      Ectopic      Multiple  0   Live Births  3            Home Medications    Prior to Admission medications   Medication Sig Start Date End Date Taking? Authorizing Provider  tiZANidine  (ZANAFLEX ) 4 MG capsule Take 1 capsule (4 mg total) by mouth 3 (three) times daily as needed for muscle spasms. Do not drink alcohol or drive while taking this medication.  May cause drowsiness. 05/20/24  Yes Corbin Dess, PA-C  acetaminophen  (TYLENOL ) 500 MG tablet Take 2 tablets (1,000 mg total) by mouth every 6 (six) hours as needed for mild pain, moderate pain or headache. 08/26/20   Charleen Conn, MD  albuterol  (VENTOLIN  HFA) 108 [phone] Base) MCG/ACT inhaler Inhale 2 puffs into the lungs every 6 (six) hours as needed for wheezing or shortness of breath. 06/23/23   Bennet Brasil, MD  aspirin  EC 81 MG tablet Take 81 mg by mouth daily. Swallow whole.    [provider]  azelastine  (ASTELIN ) 0.1 % nasal spray Place 2 sprays into both nostrils 2 (two) times daily. 04/06/23   Bennet Brasil, MD  Eptinezumab -jjmr (VYEPTI  IV) Inject into the vein every 3 (three) months.    [provider]  fluconazole  (DIFLUCAN ) 150 MG tablet Take 1 tablet by mouth today. May repeat every 72 hours up to 2 additional doses for continued symptoms. Patient not taking: Reported on 02/18/2024 12/20/23   Leath-Warren, Belen Bowers, NP  fluticasone  (FLONASE ) 50 MCG/ACT nasal spray Place 2 sprays into both nostrils daily. 03/10/23   Mardene Shake, FNP  hydrOXYzine  (VISTARIL ) 25 MG capsule Take 1 cap po at bedtime PRN sleep. 02/18/24   Derenda Flax, NP  medroxyPROGESTERone  (DEPO-PROVERA ) 150 MG/ML injection Inject 150 mg into the muscle every 3 (three) months.    [provider]   medroxyPROGESTERone  (DEPO-PROVERA ) 150 MG/ML injection Inject 1 mL (150 mg total) into the muscle every 3 (three) months. 02/09/24 05/09/24  Ozan, Jennifer, DO  medroxyPROGESTERone  Acetate 150 MG/ML SUSY Inject 1 mL (150 mg total) into the muscle every 3 (three) months. 02/09/24   Ozan, Jennifer, DO  meloxicam  (MOBIC ) 15 MG tablet Take 1 tablet (15 mg total) by mouth daily. Prn pain; do not take with OTC NSAIDs such as Ibuprofen  04/18/24   Derenda Flax, NP  Misc. Devices (WRIST BRACE) MISC Apply to both wrists as directed for wrist pain 04/18/24   Derenda Flax, NP  olopatadine  (PATANOL) 0.1 % ophthalmic solution Place 1 drop into both eyes 2 (two) times daily. 03/10/23   Mardene Shake, FNP  ondansetron  (ZOFRAN -ODT) 4 MG disintegrating tablet Take 1-2 tablets (4-8 mg total)  by mouth every 8 (eight) hours as needed. 11/23/22   Glory Larsen, MD  sertraline  (ZOLOFT ) 50 MG tablet Take 1 tablet (50 mg total) by mouth daily. 02/18/24   Derenda Flax, NP  Ubrogepant  (UBRELVY ) 100 MG TABS Take 1 tablet (100 mg total) by mouth every 2 (two) hours as needed. Maximum 200mg  a day. 06/24/23   Glory Larsen, MD    Family History Family History  Problem Relation Age of Onset   Multiple sclerosis Mother    Migraines Mother    Hypertension Father    Hypertension Paternal Aunt    Heart disease Paternal Uncle    Diabetes Paternal Uncle    Hypertension Paternal Uncle    Heart disease Maternal Grandfather    Cancer Other        father's aunt had breast cancer   Colon cancer Other        maternal great uncle, age greater than 60   Crohn's disease Other        couple of family members on father's side of family    Social History Social History   Tobacco Use   Smoking status: Former    Types: Cigars   Smokeless tobacco: Never  Vaping Use   Vaping status: Never Used  Substance Use Topics   Alcohol use: Yes   Drug use: No     Allergies   Adhesive [tape], Lorabid [loracarbef], Latex,  and Orange fruit [citrus]   Review of Systems Review of Systems PER HPI  Physical Exam Triage Vital Signs ED Triage Vitals [05/20/24 1513]  Encounter Vitals Group     BP (!) 147/78     Girls Systolic BP Percentile      Girls Diastolic BP Percentile      Boys Systolic BP Percentile      Boys Diastolic BP Percentile      Pulse Rate (!) 106     Resp 20     Temp 98.9 F (37.2 C)     Temp Source Oral     SpO2 100 %     Weight      Height      Head Circumference      Peak Flow      Pain Score 7     Pain Loc      Pain Education      Exclude from Growth Chart    No data found.  Updated Vital Signs BP (!) 147/78 (BP Location: Right Arm)   Pulse (!) 106   Temp 98.9 F (37.2 C) (Oral)   Resp 20   SpO2 100%   Visual Acuity Right Eye Distance:   Left Eye Distance:   Bilateral Distance:    Right Eye Near:   Left Eye Near:    Bilateral Near:     Physical Exam Vitals and nursing note reviewed.  Constitutional:      Appearance: Normal appearance. She is not ill-appearing.  HENT:     Head: Atraumatic.   Eyes:     Extraocular Movements: Extraocular movements intact.     Conjunctiva/sclera: Conjunctivae normal.    Cardiovascular:     Rate and Rhythm: Normal rate.  Pulmonary:     Effort: Pulmonary effort is normal.   Musculoskeletal:        General: Tenderness present. No swelling or deformity. Normal range of motion.     Cervical back: Normal range of motion and neck supple.     Comments: Tenderness to palpation to  the radial aspect of the left wrist with mild crepitus upon range of motion.  No bony deformity palpable and range of motion intact   Skin:    General: Skin is warm and dry.   Neurological:     Mental Status: She is alert and oriented to person, place, and time.     Comments: Left upper extremity neurovascularly intact  Psychiatric:        Mood and Affect: Mood normal.        Thought Content: Thought content normal.        Judgment: Judgment  normal.      UC Treatments / Results  Labs (all labs ordered are listed, but only abnormal results are displayed) Labs Reviewed - No data to display  EKG   Radiology No results found.  Procedures Procedures (including critical care time)  Medications Ordered in UC Medications  dexamethasone  (DECADRON ) injection 10 mg (has no administration in time range)    Initial Impression / Assessment and Plan / UC Course  I have reviewed the triage vital signs and the nursing notes.  Pertinent labs & imaging results that were available during my care of the patient were reviewed by me and considered in my medical decision making (see chart for details).     Suspect wrist tendinitis.  Treat with IM Decadron , Zanaflex , massage, bracing, elevation, ice off-and-on.  Follow-up with Ortho if not resolving. Final Clinical Impressions(s) / UC Diagnoses   Final diagnoses:  Left wrist tendonitis   Discharge Instructions   None    ED Prescriptions     Medication Sig Dispense Auth. Provider   tiZANidine  (ZANAFLEX ) 4 MG capsule Take 1 capsule (4 mg total) by mouth 3 (three) times daily as needed for muscle spasms. Do not drink alcohol or drive while taking this medication.  May cause drowsiness. 15 capsule Corbin Dess, [place]      PDMP not reviewed this encounter.   Jilleen, [place], [place] 05/20/24 1551

## 2024-05-20 NOTE — ED Triage Notes (Addendum)
 Pt reports left wrist pain x1 month. Pt denies any known injury. Seen for similar recently and reports hx of bilateral carpal tunnel.

## 2024-05-22 ENCOUNTER — Other Ambulatory Visit: Payer: Self-pay

## 2024-05-22 DIAGNOSIS — R051 Acute cough: Secondary | ICD-10-CM

## 2024-05-22 MED ORDER — ALBUTEROL SULFATE HFA 108 (90 BASE) MCG/ACT IN AERS
2.0000 | INHALATION_SPRAY | Freq: Four times a day (QID) | RESPIRATORY_TRACT | 0 refills | Status: AC | PRN
Start: 2024-05-22 — End: ?

## 2024-05-22 MED ORDER — AZELASTINE HCL 0.1 % NA SOLN: 2.0000 | Freq: Two times a day (BID) | NASAL | 12 refills | Status: AC

## 2024-05-25 ENCOUNTER — Other Ambulatory Visit (HOSPITAL_COMMUNITY): Payer: Self-pay

## 2024-05-25 MED ORDER — SERTRALINE HCL 50 MG PO TABS: 50.0000 mg | ORAL_TABLET | Freq: Every day | ORAL | 0 refills | Status: DC

## 2024-05-25 MED ORDER — HYDROXYZINE PAMOATE 25 MG PO CAPS
25.0000 mg | ORAL_CAPSULE | Freq: Every evening | ORAL | 2 refills | Status: AC | PRN
  Filled 2024-06-07 – 2024-07-19 (×3): qty 30, 30d supply, fill #0
  Filled 2024-12-19: qty 30, 30d supply, fill #1

## 2024-05-25 MED ORDER — MELOXICAM 15 MG PO TABS: 15.0000 mg | ORAL_TABLET | Freq: Every day | ORAL | 0 refills | Status: DC | PRN

## 2024-06-07 ENCOUNTER — Other Ambulatory Visit (HOSPITAL_COMMUNITY): Payer: Self-pay

## 2024-06-07 ENCOUNTER — Other Ambulatory Visit: Payer: Self-pay | Admitting: Neurology

## 2024-06-07 ENCOUNTER — Other Ambulatory Visit: Payer: Self-pay | Admitting: Nurse Practitioner

## 2024-06-07 DIAGNOSIS — G43709 Chronic migraine without aura, not intractable, without status migrainosus: Secondary | ICD-10-CM

## 2024-06-07 MED ORDER — SERTRALINE HCL 50 MG PO TABS
50.0000 mg | ORAL_TABLET | Freq: Every day | ORAL | 0 refills | Status: DC
  Filled 2024-06-07: qty 90, 90d supply, fill #0

## 2024-06-07 MED ORDER — MELOXICAM 15 MG PO TABS
15.0000 mg | ORAL_TABLET | Freq: Every day | ORAL | 0 refills | Status: DC | PRN
  Filled 2024-06-07 – 2024-07-19 (×4): qty 30, 30d supply, fill #0

## 2024-06-08 ENCOUNTER — Other Ambulatory Visit: Payer: Self-pay

## 2024-06-08 ENCOUNTER — Other Ambulatory Visit (HOSPITAL_COMMUNITY): Payer: Self-pay

## 2024-06-13 ENCOUNTER — Ambulatory Visit (INDEPENDENT_AMBULATORY_CARE_PROVIDER_SITE_OTHER)

## 2024-06-13 ENCOUNTER — Ambulatory Visit
Admission: EM | Admit: 2024-06-13 | Discharge: 2024-06-13 | Disposition: A | Discharge status: Home / Self Care | Source: Ambulatory Visit | Attending: Nurse Practitioner | Admitting: Nurse Practitioner

## 2024-06-13 ENCOUNTER — Other Ambulatory Visit: Payer: Self-pay

## 2024-06-13 ENCOUNTER — Ambulatory Visit: Payer: Self-pay

## 2024-06-13 ENCOUNTER — Encounter: Payer: Self-pay | Admitting: Emergency Medicine

## 2024-06-13 DIAGNOSIS — R000 Tachycardia, unspecified: Secondary | ICD-10-CM | POA: Diagnosis not present

## 2024-06-13 DIAGNOSIS — R0602 Shortness of breath: Secondary | ICD-10-CM | POA: Diagnosis not present

## 2024-06-13 NOTE — Telephone Encounter (Signed)
 FYI Only or Action Required?: FYI only for provider.  Patient was last seen in primary care on 04/18/2024 by Mauro Elveria BROCKS, NP.  Called Nurse Triage reporting Elevated Heart Rate.  Symptoms began yesterday.  Interventions attempted: Rest, hydration, or home remedies.  Symptoms are: gradually worsening.  Triage Disposition: Go to ED or PCP/Alternative with Approval  Patient/caregiver understands and will follow disposition?: Yes  Copied from CRM [id number]. Topic: Clinical - Red Word Triage >> Jun 13, 2024  2:28 PM Tiffini S wrote: [facility] that prompted transfer to Nurse Triage: Patient called stating that her heart rate is elevated since yesterday- current heart rate is 128. Have only taken prescribed medications. Reason for Disposition  Patient sounds very sick or weak to the triager  Answer Assessment - Initial Assessment Questions 1. DESCRIPTION: Please describe your heart rate or heartbeat that you are having (e.g., fast/slow, regular/irregular, skipped or extra beats, palpitations)     Patient states that she can tell that its fast 2. ONSET: When did it start? (Minutes, hours or days)      Yesterday.  3. DURATION: How long does it last (e.g., seconds, minutes, hours)     Lasts for several hours 4. PATTERN Does it come and go, or has it been constant since it started?  Does it get worse with exertion?   Are you feeling it now?     constant 5. TAP: Using your hand, can you tap out what you are feeling on a chair or table in front of you, so that I can hear? (Note: not all patients can do this)       128 6. HEART RATE: Can you tell me your heart rate? How many beats in 15 seconds?  (Note: not all patients can do this)       128 7. RECURRENT SYMPTOM: Have you ever had this before? If Yes, ask: When was the last time? and What happened that time?      Yes-last time she diagnosed with anxiety 8. CAUSE: What do you think is causing the palpitations?      unsure 9. CARDIAC HISTORY: Do you have any history of heart disease? (e.g., heart attack, angina, bypass surgery, angioplasty, arrhythmia)      no 10. OTHER SYMPTOMS: Do you have any other symptoms? (e.g., dizziness, chest pain, sweating, difficulty breathing)       no 11. PREGNANCY: Is there any chance you are pregnant? When was your last menstrual period?       no  Protocols used: Heart Rate and Heartbeat Questions-A-AH

## 2024-06-13 NOTE — ED Notes (Signed)
 Multiple EKGs completed due to artifact.

## 2024-06-13 NOTE — Discharge Instructions (Addendum)
 Your EKG shows that your heart rate was elevated.  Your chest x-ray is pending along with lab work to include CBC and BMP.  You will be contacted if your pending test results are abnormal.  You will also access to your results via MyChart. Increase fluids and allow for plenty of rest. Avoid caffeine  such as tea, soda, or coffee while your heart rate remains elevated. Try to decrease your stress to help prevent further elevation of your heart rate. Go to the emergency department immediately if you experience increased heart rate, shortness of breath, chest pain, difficulty breathing, or other concerns. Please follow-up with your primary care physician within the next 3 to 5 days if possible.  Recommend discussing referral to cardiology for further evaluation. Follow-up as needed.

## 2024-06-13 NOTE — Telephone Encounter (Signed)
 noted

## 2024-06-13 NOTE — ED Provider Notes (Addendum)
 [facility]    CSN: [id number] Arrival date & time: 06/13/24  1446      History   Chief Complaint Chief Complaint  Patient presents with   Irregular Heart Beat    HPI Melissa Livingston is a 28 y.o. female.   The history is provided by the patient.   Patient presents with a several day history of elevated heart rate.  Patient was working today and noticed shortness of breath that gradually worsened.  Patient denies fever, chills, chest pain, difficulty breathing, wheezing, abdominal pain, nausea, vomiting, diarrhea, or rash.  Patient reports that she is currently taking cetirizine  and meloxicam , denies any other possible stimulants to include caffeine , tea, or soda.  Patient reports underlying history of TIA.  States that she also has an underlying history of anemia.  States that she does take an iron  supplement daily.  Patient reports that she is on Depo-Provera  for birth control.  Denies prior history of smoking.  Past Medical History:  Diagnosis Date   Anemia    Anxiety    Asthma    as child   Endometritis 01/11/2015   GERD (gastroesophageal reflux disease)    Headache    Hematochezia 05/08/2015   History of ovarian cyst 01/28/2015   IBS (irritable bowel syndrome)    Infection    UTI   Migraine headache    Nausea and vomiting 01/11/2015   Ovarian cyst 01/14/2015   Pelvic pain in female 01/11/2015   Pregnancy induced hypertension    with pregnancy   Pregnant 02/05/2016   Rectal bleeding 07/18/2013   Stroke [facility])     Patient Active Problem List   Diagnosis Date Noted   Wrist tendonitis 04/18/2024   Bilateral carpal tunnel syndrome 04/18/2024   Adjustment disorder with mixed anxiety and depressed mood 02/19/2024   Menorrhagia with regular cycle 08/06/2023   Encounter for well woman exam with routine gynecological exam 08/06/2023   Vaginal itching 08/06/2023   Screening examination for STD (sexually transmitted disease) 08/06/2023   General counseling and  advice for contraceptive management 08/06/2023   Menorrhagia with irregular cycle 07/16/2023   Iron  deficiency anemia due to chronic blood loss 07/16/2023   Situational stress 07/16/2023   Chronic migraine without aura without status migrainosus, not intractable 11/23/2022   Abdominal wall pain in suprapubic region 07/28/2022   Dyspareunia, female 07/28/2022   Encounter for gynecological examination with Papanicolaou smear of cervix 07/28/2022   Sleep disturbance 06/05/2021   Nexplanon  in place 01/22/2021   Weight gain 01/22/2021   Frequent headaches 01/22/2021   Pregnancy examination or test, negative result 01/22/2021   Swelling of both ankles 01/22/2021   Short of breath on exertion 01/22/2021   Nexplanon  insertion 08/26/2020   Sepsis (HCC) 07/25/2020   Pneumonia due to COVID-19 virus 07/25/2020   Chronic hypertension 03/21/2020   Headache disorder 01/01/2019   Chronic pain of both knees 08/25/2018   Constipation 08/25/2018   Acute left-sided low back pain with left-sided sciatica 01/09/2017   Morbid obesity (HCC) 01/09/2017   S/P cesarean section 03/04/2016   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   GERD (gastroesophageal reflux disease) 07/18/2013   Irritable bowel syndrome 07/03/2013   Depression with anxiety 07/03/2013   Asthma 03/27/2013    Past Surgical History:  Procedure Laterality Date   CESAREAN SECTION N/A 09/29/2014   Procedure: CESAREAN SECTION;  Surgeon: Norleen Edsel GAILS, MD;  Location: [facility]S;  Service: Obstetrics;  Laterality: N/A;   CESAREAN SECTION N/A 09/25/2016  Procedure: CESAREAN SECTION;  Surgeon: Norleen LULLA Server, MD;  Location: [facility];  Service: Obstetrics;  Laterality: N/A;   CESAREAN SECTION N/A 08/25/2020   Procedure: CESAREAN SECTION;  Surgeon: Jayne Vonn DEL, MD;  Location: [facility] LD ORS;  Service: Obstetrics;  Laterality: N/A;   CHOLECYSTECTOMY N/A 03/07/2018   Procedure: LAPAROSCOPIC CHOLECYSTECTOMY;  Surgeon: Mavis Anes, MD;   Location: [facility];  Service: General;  Laterality: N/A;   COLONOSCOPY WITH PROPOFOL  N/A 08/02/2017   Dr. Shaaron: moderate internal hemorhoids, distal 5 cm of TI also appeared normal   ESOPHAGOGASTRODUODENOSCOPY (EGD) WITH ESOPHAGEAL DILATION N/A 07/19/2013   Dr. Charley appearing esophagus s/p dilation, normal D1 and D2, unremarkable path    ESOPHAGOGASTRODUODENOSCOPY (EGD) WITH PROPOFOL  N/A 08/02/2017   Normal esophagus, small hiatal hernia, normal duodenum   none     WISDOM TOOTH EXTRACTION      OB History     Gravida  3   Para  3   Term  3   Preterm      AB      Living  3      SAB      IAB      Ectopic      Multiple  0   Live Births  3            Home Medications    Prior to Admission medications   Medication Sig Start Date End Date Taking? Authorizing Provider  acetaminophen  (TYLENOL ) 500 MG tablet Take 2 tablets (1,000 mg total) by mouth every 6 (six) hours as needed for mild pain, moderate pain or headache. 08/26/20   Cecilia Bernarda BROCKS, MD  albuterol  (VENTOLIN  HFA) 108 (90 Base) MCG/ACT inhaler Inhale 2 puffs into the lungs every 6 (six) hours as needed for wheezing or shortness of breath. 05/22/24   Alphonsa Glendia LABOR, MD  aspirin  EC 81 MG tablet Take 81 mg by mouth daily. Swallow whole.    [provider]  azelastine  (ASTELIN ) 0.1 % nasal spray Place 2 sprays into both nostrils 2 (two) times daily. 05/22/24   Alphonsa Glendia LABOR, MD  Eptinezumab -jjmr (VYEPTI  IV) Inject into the vein every 3 (three) months.    [provider]  fluconazole  (DIFLUCAN ) 150 MG tablet Take 1 tablet by mouth today. May repeat every 72 hours up to 2 additional doses for continued symptoms. Patient not taking: Reported on 02/18/2024 12/20/23   Leath-Warren, Etta PARAS, NP  fluticasone  (FLONASE ) 50 MCG/ACT nasal spray Place 2 sprays into both nostrils daily. 03/10/23   Kennyth Domino, FNP  hydrOXYzine  (VISTARIL ) 25 MG capsule Take 1 cap po at bedtime PRN sleep. 02/18/24    Hoskins, Carolyn C, NP  hydrOXYzine  (VISTARIL ) 25 MG capsule Take 1 capsule (25 mg total) by mouth at bedtime as needed for sleep. 02/18/24   Hoskins, Carolyn C, NP  medroxyPROGESTERone  (DEPO-PROVERA ) 150 MG/ML injection Inject 150 mg into the muscle every 3 (three) months.    [provider]  medroxyPROGESTERone  (DEPO-PROVERA ) 150 MG/ML injection Inject 1 mL (150 mg total) into the muscle every 3 (three) months. 02/09/24 05/09/24  Ozan, Jennifer, DO  medroxyPROGESTERone  Acetate 150 MG/ML SUSY Inject 1 mL (150 mg total) into the muscle every 3 (three) months. 02/09/24   Ozan, Jennifer, DO  meloxicam  (MOBIC ) 15 MG tablet Take 1 tablet (15 mg total) by mouth daily. Prn pain; do not take with OTC NSAIDs such as Ibuprofen  04/18/24   Mauro Elveria BROCKS, NP  meloxicam  (MOBIC ) 15 MG tablet Take  1 tablet (15 mg total) by mouth daily as needed for pain. - Do not take with any otc nsaids such as ibuprofen - 06/07/24   Alphonsa Glendia LABOR, MD  Misc. Devices (WRIST BRACE) MISC Apply to both wrists as directed for wrist pain 04/18/24   Mauro Elveria BROCKS, NP  olopatadine  (PATANOL) 0.1 % ophthalmic solution Place 1 drop into both eyes 2 (two) times daily. 03/10/23   Kennyth Domino, FNP  ondansetron  (ZOFRAN -ODT) 4 MG disintegrating tablet Take 1-2 tablets (4-8 mg total) by mouth every 8 (eight) hours as needed. 11/23/22   Ines Onetha NOVAK, MD  sertraline  (ZOLOFT ) 50 MG tablet Take 1 tablet (50 mg total) by mouth daily. 02/18/24   Mauro Elveria BROCKS, NP  sertraline  (ZOLOFT ) 50 MG tablet Take 1 tablet (50 mg total) by mouth daily. 06/07/24   Alphonsa Glendia LABOR, MD  tiZANidine  (ZANAFLEX ) 4 MG capsule Take 1 capsule (4 mg total) by mouth 3 (three) times daily as needed for muscle spasms. Do not drink alcohol or drive while taking this medication.  May cause drowsiness. 05/20/24   Stuart Vernell Norris, PA-C  Ubrogepant  (UBRELVY ) 100 MG TABS Take 1 tablet (100 mg total) by mouth every 2 (two) hours as needed. Maximum 200mg  a day. 06/24/23    Ines Onetha NOVAK, MD    Family History Family History  Problem Relation Age of Onset   Multiple sclerosis Mother    Migraines Mother    Hypertension Father    Hypertension Paternal Aunt    Heart disease Paternal Uncle    Diabetes Paternal Uncle    Hypertension Paternal Uncle    Heart disease Maternal Grandfather    Cancer Other        father's aunt had breast cancer   Colon cancer Other        maternal great uncle, age greater than 84   Crohn's disease Other        couple of family members on father's side of family    Social History Social History   Tobacco Use   Smoking status: Former    Types: Cigars   Smokeless tobacco: Never  Vaping Use   Vaping status: Never Used  Substance Use Topics   Alcohol use: Yes   Drug use: No     Allergies   Adhesive [tape], Lorabid [loracarbef], Latex, and Orange fruit [citrus]   Review of Systems Review of Systems Per HPI  Physical Exam Triage Vital Signs ED Triage Vitals  Encounter Vitals Group     BP 06/13/24 1454 138/84     Girls Systolic BP Percentile --      Girls Diastolic BP Percentile --      Boys Systolic BP Percentile --      Boys Diastolic BP Percentile --      Pulse Rate 06/13/24 1454 (!) 112     Resp 06/13/24 1454 20     Temp 06/13/24 1454 98.2 F (36.8 C)     Temp Source 06/13/24 1454 Oral     SpO2 06/13/24 1454 96 %     Weight --      Height --      Head Circumference --      Peak Flow --      Pain Score 06/13/24 1452 0     Pain Loc --      Pain Education --      Exclude from Growth Chart --    No data found.  Updated Vital Signs BP 138/84 (BP  Location: Right Arm)   Pulse (!) 112   Temp 98.2 F (36.8 C) (Oral)   Resp 20   SpO2 96%   Visual Acuity Right Eye Distance:   Left Eye Distance:   Bilateral Distance:    Right Eye Near:   Left Eye Near:    Bilateral Near:     Physical Exam Vitals and nursing note reviewed.  Constitutional:      General: She is not in acute distress.     Appearance: Normal appearance.  HENT:     Head: Normocephalic.  Eyes:     Extraocular Movements: Extraocular movements intact.     Conjunctiva/sclera: Conjunctivae normal.     Pupils: Pupils are equal, round, and reactive to light.  Cardiovascular:     Rate and Rhythm: Normal rate and regular rhythm.     Pulses: Normal pulses.     Heart sounds: Normal heart sounds.  Pulmonary:     Effort: Pulmonary effort is normal. No respiratory distress.     Breath sounds: Normal breath sounds. No stridor. No wheezing, rhonchi or rales.  Abdominal:     General: Bowel sounds are normal.     Palpations: Abdomen is soft.     Tenderness: There is no abdominal tenderness.  Musculoskeletal:     Cervical back: Normal range of motion.  Lymphadenopathy:     Cervical: No cervical adenopathy.  Skin:    General: Skin is warm and dry.  Neurological:     General: No focal deficit present.     Mental Status: She is alert and oriented to person, place, and time.  Psychiatric:        Mood and Affect: Mood normal.        Behavior: Behavior normal.      UC Treatments / Results  Labs (all labs ordered are listed, but only abnormal results are displayed) Labs Reviewed  CBC WITH DIFFERENTIAL/PLATELET  BASIC METABOLIC PANEL WITH GFR    EKG   Radiology DG Chest 2 View Result Date: 06/13/2024 CLINICAL DATA:  Tachycardia. EXAM: CHEST - 2 VIEW COMPARISON:  March 25, 2020. FINDINGS: The heart size and mediastinal contours are within normal limits. Both lungs are clear. The visualized skeletal structures are unremarkable. IMPRESSION: No active cardiopulmonary disease. Electronically Signed   By: Lynwood Landy Raddle M.D.   On: 06/13/2024 15:39    Procedures Procedures (including critical care time)  Medications Ordered in UC Medications - No data to display  Initial Impression / Assessment and Plan / UC Course  I have reviewed the triage vital signs and the nursing notes.  Pertinent labs & imaging results  that were available during my care of the patient were reviewed by me and considered in my medical decision making (see chart for details).  Patient presents for complaints of elevated heart rate for the past several days.  On exam, lung sounds are clear throughout, room air sats at 96%.  EKG shows sinus tachycardia, no ectopy, no STEMI.  Chest x-ray was also negative for active cardiopulmonary disease.  Patient reports intermittent shortness of breath, most often when she is exerting herself.  She is currently on Depo, advised patient that if she develops worsening shortness of breath or trouble breathing, recommend ER follow-up to rule out possible PE.  Supportive care recommendations were provided and discussed with the patient to include over-the-counter analgesics, avoiding caffeine , and decreasing stress.  Would like for patient to follow-up with her PCP within the next 3 to 5 days  for reevaluation to discuss possible referral to cardiology.  Patient was in agreement with this plan of care and verbalizes understanding.  All questions were answered.  Patient stable for discharge.  Final Clinical Impressions(s) / UC Diagnoses   Final diagnoses:  Tachycardia  Shortness of breath     Discharge Instructions      Your EKG shows that your heart rate was elevated.  Your chest x-ray is pending along with lab work to include CBC and BMP.  You will be contacted if your pending test results are abnormal.  You will also access to your results via MyChart. Increase fluids and allow for plenty of rest. Avoid caffeine  such as tea, soda, or coffee while your heart rate remains elevated. Try to decrease your stress to help prevent further elevation of your heart rate. Go to the emergency department immediately if you experience increased heart rate, shortness of breath, chest pain, difficulty breathing, or other concerns. Please follow-up with your primary care physician within the next 3 to 5 days if  possible.  Recommend discussing referral to cardiology for further evaluation. Follow-up as needed.     ED Prescriptions   None    PDMP not reviewed this encounter.   Gilmer Etta PARAS, NP 06/13/24 1550    Gilmer Etta PARAS, NP 06/13/24 1550

## 2024-06-13 NOTE — ED Triage Notes (Signed)
 Pt reports intermittent elevated heart rate with increased frequency for last several weeks. Denies any shortness of breath, chest pain, palpitations at this time. Reports I just feel off.

## 2024-06-14 ENCOUNTER — Other Ambulatory Visit: Payer: Self-pay

## 2024-06-14 LAB — CBC WITH DIFFERENTIAL/PLATELET
Basophils Absolute: 0 x10E3/uL (ref 0.0–0.2)
Basos: 0 %
EOS (ABSOLUTE): 0.1 x10E3/uL (ref 0.0–0.4)
Eos: 2 %
Hematocrit: 38.1 % (ref 34.0–46.6)
Hemoglobin: 11.3 g/dL (ref 11.1–15.9)
Immature Grans (Abs): 0 x10E3/uL (ref 0.0–0.1)
Immature Granulocytes: 0 %
Lymphocytes Absolute: 2 x10E3/uL (ref 0.7–3.1)
Lymphs: 26 %
MCH: 22.5 pg — ABNORMAL LOW (ref 26.6–33.0)
MCHC: 29.7 g/dL — ABNORMAL LOW (ref 31.5–35.7)
MCV: 76 fL — ABNORMAL LOW (ref 79–97)
Monocytes Absolute: 0.4 x10E3/uL (ref 0.1–0.9)
Monocytes: 5 %
Neutrophils Absolute: 5.3 x10E3/uL (ref 1.4–7.0)
Neutrophils: 67 %
Platelets: 365 x10E3/uL (ref 150–450)
RBC: 5.02 x10E6/uL (ref 3.77–5.28)
RDW: 16.8 % — ABNORMAL HIGH (ref 11.7–15.4)
WBC: 7.8 x10E3/uL (ref 3.4–10.8)

## 2024-06-14 LAB — BASIC METABOLIC PANEL WITH GFR
BUN/Creatinine Ratio: 8 — ABNORMAL LOW (ref 9–23)
BUN: 6 mg/dL (ref 6–20)
CO2: 21 mmol/L (ref 20–29)
Calcium: 8.8 mg/dL (ref 8.7–10.2)
Chloride: 104 mmol/L (ref 96–106)
Creatinine, Ser: 0.72 mg/dL (ref 0.57–1.00)
Glucose: 116 mg/dL — ABNORMAL HIGH (ref 70–99)
Potassium: 3.9 mmol/L (ref 3.5–5.2)
Sodium: 138 mmol/L (ref 134–144)
eGFR: 115 mL/min/1.73 (ref >59)

## 2024-06-15 ENCOUNTER — Ambulatory Visit (HOSPITAL_COMMUNITY): Payer: Self-pay

## 2024-06-15 NOTE — Telephone Encounter (Signed)
 Patient is not anemic at this time but is iron  deficient, recommend daily OTC iron  supplement taken with 500 mg of vitamin C daily.  Basic metabolic panel is unremarkable.

## 2024-06-16 ENCOUNTER — Ambulatory Visit (INDEPENDENT_AMBULATORY_CARE_PROVIDER_SITE_OTHER): Admitting: Physician Assistant

## 2024-06-16 VITALS — BP 123/79 | HR 98 | Temp 98.1°F | Ht 62.0 in | Wt 247.0 lb

## 2024-06-16 DIAGNOSIS — R000 Tachycardia, unspecified: Secondary | ICD-10-CM | POA: Diagnosis not present

## 2024-06-17 ENCOUNTER — Encounter: Payer: Self-pay | Admitting: Physician Assistant

## 2024-06-17 DIAGNOSIS — R000 Tachycardia, unspecified: Secondary | ICD-10-CM | POA: Insufficient documentation

## 2024-06-17 NOTE — Progress Notes (Signed)
   Established Patient Office Visit  Subjective   Patient ID: Melissa Livingston, female    DOB: 1996/06/29  Age: 28 y.o. MRN: [id number]  Chief Complaint  Patient presents with   heart rate elevated    Pt noticed as high as 125 for about 3 days intermittently, no other symptoms voiced    Patient presents today for urgent care follow up regarding intermittent episodes of racing heart. Patient was seen and evaluated in UC on 7/8 for concerns of tachycardia. At that time, overall benign workup. EKG and lab work reviewed today. EKG shows sinus tachycardia, no ectopy, no STEMI.  Chest x-ray was also negative for active cardiopulmonary disease. Today, she reports approximately 2 episodes of hearting racing lasting about 20 minutes at a time. She relates heart rate ranging from 120-130, relates baseline heart rate around 90-100. Patient does relate history of anxiety, however denies worsening symptoms or triggers today. Denies significant chest pain or shortness of breath, however does endorse mild shortness of breath with exertion.      Review of Systems  Constitutional:  Negative for chills, fever and malaise/fatigue.  Respiratory:  Negative for cough and shortness of breath.   Cardiovascular:  Positive for palpitations. Negative for chest pain.  Neurological:  Negative for dizziness, loss of consciousness and headaches.      Objective:     BP 123/79   Pulse 98   Temp 98.1 F (36.7 C)   Ht 5' 2 (1.575 m)   Wt 247 lb (112 kg)   SpO2 98%   BMI 45.18 kg/m    Physical Exam Constitutional:      General: She is not in acute distress.    Appearance: Normal appearance. She is obese.  HENT:     Head: Normocephalic and atraumatic.  Cardiovascular:     Rate and Rhythm: Normal rate and regular rhythm.     Heart sounds: Normal heart sounds. No murmur heard. Pulmonary:     Effort: Pulmonary effort is normal.     Breath sounds: Normal breath sounds.  Neurological:     General: No focal  deficit present.     Mental Status: She is alert.  Psychiatric:        Mood and Affect: Mood normal.     No results found for any visits on 06/16/24.  The ASCVD Risk score (Arnett DK, et al., 2019) failed to calculate for the following reasons:   The 2019 ASCVD risk score is only valid for ages 27 to 73   Risk score cannot be calculated because patient has a medical history suggesting prior/existing ASCVD    Assessment & Plan:   Return if symptoms worsen or fail to improve.   Sinus tachycardia Assessment & Plan: Patient presents today with intermittent tachycardia. Physical exam, overall within normal limits today. Heart sounds are normal, no murmurs, normal rate and rhythm. Lungs clear to auscultation bilaterally. Patient does not appear in distress. UC lab work and imaging reviewed and discussed with patient. Will do thryoid studies to rule our thyroid  etiology. Referral to cardiology for further workup. Patient to seek care in the ER for new chest pain, worsening shortness of breath, pain with deep breaths, dizziness, or syncope. Patient agreeable to plan.   Orders: -     Ambulatory referral to Cardiology -     TSH + free T4    Izaia Say, PA-C

## 2024-06-17 NOTE — Assessment & Plan Note (Signed)
 Patient presents today with intermittent tachycardia. Physical exam, overall within normal limits today. Heart sounds are normal, no murmurs, normal rate and rhythm. Lungs clear to auscultation bilaterally. Patient does not appear in distress. UC lab work and imaging reviewed and discussed with patient. Will do thryoid studies to rule our thyroid  etiology. Referral to cardiology for further workup. Patient to seek care in the ER for new chest pain, worsening shortness of breath, pain with deep breaths, dizziness, or syncope. Patient agreeable to plan.

## 2024-06-23 ENCOUNTER — Other Ambulatory Visit (HOSPITAL_COMMUNITY): Payer: Self-pay

## 2024-06-26 ENCOUNTER — Other Ambulatory Visit (HOSPITAL_COMMUNITY): Payer: Self-pay

## 2024-06-26 ENCOUNTER — Other Ambulatory Visit: Payer: Self-pay | Admitting: Neurology

## 2024-06-26 ENCOUNTER — Encounter: Payer: Self-pay | Admitting: Pharmacist

## 2024-06-26 ENCOUNTER — Other Ambulatory Visit: Payer: Self-pay

## 2024-06-26 DIAGNOSIS — G43709 Chronic migraine without aura, not intractable, without status migrainosus: Secondary | ICD-10-CM

## 2024-06-29 ENCOUNTER — Other Ambulatory Visit: Payer: Self-pay

## 2024-07-10 ENCOUNTER — Ambulatory Visit
Admission: EM | Admit: 2024-07-10 | Discharge: 2024-07-10 | Disposition: A | Discharge status: Home / Self Care | Attending: Family Medicine | Admitting: Family Medicine

## 2024-07-10 DIAGNOSIS — K0889 Other specified disorders of teeth and supporting structures: Secondary | ICD-10-CM

## 2024-07-10 MED ORDER — LIDOCAINE VISCOUS HCL 2 % MT SOLN: 10.0000 mL | OROMUCOSAL | 0 refills | Status: DC | PRN

## 2024-07-10 MED ORDER — AMOXICILLIN-POT CLAVULANATE 875-125 MG PO TABS: 1.0000 | ORAL_TABLET | Freq: Two times a day (BID) | ORAL | 0 refills | Status: DC

## 2024-07-10 MED ORDER — CHLORHEXIDINE GLUCONATE 0.12 % MT SOLN
15.0000 mL | Freq: Two times a day (BID) | OROMUCOSAL | 0 refills | Status: DC
Start: 2024-07-10 — End: 2024-09-13

## 2024-07-10 NOTE — ED Triage Notes (Signed)
 Pt states she having pain on right side of mouth x 2 days and noticed swelling of right jaw and cheek today. Taking ibuprofen .

## 2024-07-11 NOTE — ED Provider Notes (Signed)
 [facility]    CSN: [id number] Arrival date & time: 07/10/24  0859      History   Chief Complaint Chief Complaint  Patient presents with   Oral Swelling    HPI Melissa Livingston is a 28 y.o. female.   Patient presenting today with 2-day history of right sided dental pain and swelling.  Denies fever, chills, drainage, bleeding, difficulty breathing or swallowing.  Trying ibuprofen  with minimal relief.    Past Medical History:  Diagnosis Date   Anemia    Anxiety    Asthma    as child   Endometritis 01/11/2015   GERD (gastroesophageal reflux disease)    Headache    Hematochezia 05/08/2015   History of ovarian cyst 01/28/2015   IBS (irritable bowel syndrome)    Infection    UTI   Migraine headache    Nausea and vomiting 01/11/2015   Ovarian cyst 01/14/2015   Pelvic pain in female 01/11/2015   Pregnancy induced hypertension    with pregnancy   Pregnant 02/05/2016   Rectal bleeding 07/18/2013   Stroke [facility])     Patient Active Problem List   Diagnosis Date Noted   Sinus tachycardia 06/17/2024   Bilateral carpal tunnel syndrome 04/18/2024   Adjustment disorder with mixed anxiety and depressed mood 02/19/2024   Menorrhagia with regular cycle 08/06/2023   Iron  deficiency anemia due to chronic blood loss 07/16/2023   Chronic migraine without aura without status migrainosus, not intractable 11/23/2022   Dyspareunia, female 07/28/2022   Chronic hypertension 03/21/2020   Chronic pain of both knees 08/25/2018   Constipation 08/25/2018   Acute left-sided low back pain with left-sided sciatica 01/09/2017   Morbid obesity (HCC) 01/09/2017   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   GERD (gastroesophageal reflux disease) 07/18/2013   Irritable bowel syndrome 07/03/2013   Depression with anxiety 07/03/2013   Asthma 03/27/2013    Past Surgical History:  Procedure Laterality Date   CESAREAN SECTION N/A 09/29/2014   Procedure: CESAREAN SECTION;  Surgeon:  Norleen Edsel GAILS, MD;  Location: [facility];  Service: Obstetrics;  Laterality: N/A;   CESAREAN SECTION N/A 09/25/2016   Procedure: CESAREAN SECTION;  Surgeon: Norleen GAILS Edsel, MD;  Location: [facility] BIRTHING SUITES;  Service: Obstetrics;  Laterality: N/A;   CESAREAN SECTION N/A 08/25/2020   Procedure: CESAREAN SECTION;  Surgeon: Jayne Vonn DEL, MD;  Location: [facility] LD ORS;  Service: Obstetrics;  Laterality: N/A;   CHOLECYSTECTOMY N/A 03/07/2018   Procedure: LAPAROSCOPIC CHOLECYSTECTOMY;  Surgeon: Mavis Anes, MD;  Location: [facility];  Service: General;  Laterality: N/A;   COLONOSCOPY WITH PROPOFOL  N/A 08/02/2017   Dr. Shaaron: moderate internal hemorhoids, distal 5 cm of TI also appeared normal   ESOPHAGOGASTRODUODENOSCOPY (EGD) WITH ESOPHAGEAL DILATION N/A 07/19/2013   Dr. Charley appearing esophagus s/p dilation, normal D1 and D2, unremarkable path    ESOPHAGOGASTRODUODENOSCOPY (EGD) WITH PROPOFOL  N/A 08/02/2017   Normal esophagus, small hiatal hernia, normal duodenum   none     WISDOM TOOTH EXTRACTION      OB History     Gravida  3   Para  3   Term  3   Preterm      AB      Living  3      SAB      IAB      Ectopic      Multiple  0   Live Births  3  Home Medications    Prior to Admission medications   Medication Sig Start Date End Date Taking? Authorizing Provider  amoxicillin -clavulanate (AUGMENTIN ) 875-125 MG tablet Take 1 tablet by mouth every 12 (twelve) hours. 07/10/24  Yes Stuart Vernell Norris, PA-C  chlorhexidine  (PERIDEX ) 0.12 % solution Use as directed 15 mLs in the mouth or throat 2 (two) times daily. 07/10/24  Yes Stuart Vernell Norris, PA-C  lidocaine  (XYLOCAINE ) 2 % solution Use as directed 10 mLs in the mouth or throat every 3 (three) hours as needed. 07/10/24  Yes Stuart Vernell Norris, PA-C  acetaminophen  (TYLENOL ) 500 MG tablet Take 2 tablets (1,000 mg total) by mouth every 6 (six) hours as needed for mild pain, moderate pain or headache. 08/26/20    Cecilia Bernarda BROCKS, MD  albuterol  (VENTOLIN  HFA) 108 (90 Base) MCG/ACT inhaler Inhale 2 puffs into the lungs every 6 (six) hours as needed for wheezing or shortness of breath. 05/22/24   Alphonsa Glendia LABOR, MD  azelastine  (ASTELIN ) 0.1 % nasal spray Place 2 sprays into both nostrils 2 (two) times daily. 05/22/24   Alphonsa Glendia LABOR, MD  Eptinezumab -jjmr (VYEPTI  IV) Inject into the vein every 3 (three) months.    [provider]  fluticasone  (FLONASE ) 50 MCG/ACT nasal spray Place 2 sprays into both nostrils daily. 03/10/23   Kennyth Domino, FNP  hydrOXYzine  (VISTARIL ) 25 MG capsule Take 1 capsule (25 mg total) by mouth at bedtime as needed for sleep. 02/18/24   Mauro Elveria BROCKS, NP  medroxyPROGESTERone  Acetate 150 MG/ML SUSY Inject 1 mL (150 mg total) into the muscle every 3 (three) months. 02/09/24   Ozan, Jennifer, DO  meloxicam  (MOBIC ) 15 MG tablet Take 1 tablet (15 mg total) by mouth daily as needed for pain. - Do not take with any otc nsaids such as ibuprofen - 06/07/24   Alphonsa Glendia LABOR, MD  Misc. Devices (WRIST BRACE) MISC Apply to both wrists as directed for wrist pain 04/18/24   Mauro Elveria BROCKS, NP  olopatadine  (PATANOL) 0.1 % ophthalmic solution Place 1 drop into both eyes 2 (two) times daily. 03/10/23   Kennyth Domino, FNP  ondansetron  (ZOFRAN -ODT) 4 MG disintegrating tablet Take 1-2 tablets (4-8 mg total) by mouth every 8 (eight) hours as needed. 11/23/22   Ines Onetha NOVAK, MD  sertraline  (ZOLOFT ) 50 MG tablet Take 1 tablet (50 mg total) by mouth daily. 02/18/24   Mauro Elveria BROCKS, NP  tiZANidine  (ZANAFLEX ) 4 MG capsule Take 1 capsule (4 mg total) by mouth 3 (three) times daily as needed for muscle spasms. Do not drink alcohol or drive while taking this medication.  May cause drowsiness. 05/20/24   Stuart Vernell Norris, PA-C  Ubrogepant  (UBRELVY ) 100 MG TABS Take 1 tablet (100 mg total) by mouth every 2 (two) hours as needed. Maximum 200mg  a day. 06/24/23   Ines Onetha NOVAK, MD    Family  History Family History  Problem Relation Age of Onset   Multiple sclerosis Mother    Migraines Mother    Hypertension Father    Hypertension Paternal Aunt    Heart disease Paternal Uncle    Diabetes Paternal Uncle    Hypertension Paternal Uncle    Heart disease Maternal Grandfather    Cancer Other        father's aunt had breast cancer   Colon cancer Other        maternal great uncle, age greater than 98   Crohn's disease Other        couple of family  members on father's side of family    Social History Social History   Tobacco Use   Smoking status: Former    Types: Cigars   Smokeless tobacco: Never  Vaping Use   Vaping status: Never Used  Substance Use Topics   Alcohol use: Yes   Drug use: No     Allergies   Adhesive [tape], Lorabid [loracarbef], Latex, and Orange fruit [citrus]   Review of Systems Review of Systems Per HPI  Physical Exam Triage Vital Signs ED Triage Vitals  Encounter Vitals Group     BP 07/10/24 0953 109/72     Girls Systolic BP Percentile --      Girls Diastolic BP Percentile --      Boys Systolic BP Percentile --      Boys Diastolic BP Percentile --      Pulse Rate 07/10/24 0952 88     Resp 07/10/24 0952 16     Temp 07/10/24 0952 98.2 F (36.8 C)     Temp Source 07/10/24 0952 Oral     SpO2 --      Weight --      Height --      Head Circumference --      Peak Flow --      Pain Score 07/10/24 0954 3     Pain Loc --      Pain Education --      Exclude from Growth Chart --    No data found.  Updated Vital Signs BP 109/72   Pulse 88   Temp 98.2 F (36.8 C) (Oral)   Resp 16   LMP  (LMP Unknown)   Visual Acuity Right Eye Distance:   Left Eye Distance:   Bilateral Distance:    Right Eye Near:   Left Eye Near:    Bilateral Near:     Physical Exam Vitals and nursing note reviewed.  Constitutional:      Appearance: Normal appearance. She is not ill-appearing.  HENT:     Head: Atraumatic.     Mouth/Throat:     Mouth:  Mucous membranes are moist.     Pharynx: Oropharynx is clear.     Comments: No appreciable dental abnormality in the area of her pain but coordinating facial swelling to the area of the painful tooth Eyes:     Extraocular Movements: Extraocular movements intact.     Conjunctiva/sclera: Conjunctivae normal.  Cardiovascular:     Rate and Rhythm: Normal rate and regular rhythm.     Heart sounds: Normal heart sounds.  Pulmonary:     Effort: Pulmonary effort is normal.     Breath sounds: Normal breath sounds.  Musculoskeletal:        General: Normal range of motion.     Cervical back: Normal range of motion and neck supple.  Skin:    General: Skin is warm and dry.  Neurological:     Mental Status: She is alert and oriented to person, place, and time.  Psychiatric:        Mood and Affect: Mood normal.        Thought Content: Thought content normal.        Judgment: Judgment normal.      UC Treatments / Results  Labs (all labs ordered are listed, but only abnormal results are displayed) Labs Reviewed - No data to display  EKG   Radiology No results found.  Procedures Procedures (including critical care time)  Medications Ordered in UC Medications -  No data to display  Initial Impression / Assessment and Plan / UC Course  I have reviewed the triage vital signs and the nursing notes.  Pertinent labs & imaging results that were available during my care of the patient were reviewed by me and considered in my medical decision making (see chart for details).     Treat for possible dental infection with Augmentin , viscous lidocaine , Peridex  rinse.  Discussed supportive over-the-counter medications, home care and close dental follow-up.  Return for worsening symptoms.  Final Clinical Impressions(s) / UC Diagnoses   Final diagnoses:  Pain, dental   Discharge Instructions   None    ED Prescriptions     Medication Sig Dispense Auth. Provider   amoxicillin -clavulanate  (AUGMENTIN ) 875-125 MG tablet Take 1 tablet by mouth every 12 (twelve) hours. 14 tablet Stuart Vernell Norris, PA-C   lidocaine  (XYLOCAINE ) 2 % solution Use as directed 10 mLs in the mouth or throat every 3 (three) hours as needed. 100 mL Stuart Vernell Norris, PA-C   chlorhexidine  (PERIDEX ) 0.12 % solution Use as directed 15 mLs in the mouth or throat 2 (two) times daily. 120 mL Stuart Vernell Norris, [place]      PDMP not reviewed this encounter.   Neila, [place]em, [place] 07/11/24 [phone]

## 2024-07-15 ENCOUNTER — Telehealth: Payer: Self-pay | Admitting: Family Medicine

## 2024-07-15 MED ORDER — FLUCONAZOLE 150 MG PO TABS: 150.0000 mg | ORAL_TABLET | ORAL | 0 refills | Status: DC

## 2024-07-15 NOTE — Telephone Encounter (Signed)
 Diflucan  requested, sent

## 2024-07-19 ENCOUNTER — Telehealth (HOSPITAL_COMMUNITY): Payer: Self-pay

## 2024-07-19 ENCOUNTER — Other Ambulatory Visit: Payer: Self-pay

## 2024-07-19 ENCOUNTER — Other Ambulatory Visit (HOSPITAL_COMMUNITY): Payer: Self-pay

## 2024-07-19 ENCOUNTER — Other Ambulatory Visit: Payer: Self-pay | Admitting: Family Medicine

## 2024-07-19 ENCOUNTER — Other Ambulatory Visit: Payer: Self-pay | Admitting: Neurology

## 2024-07-19 MED ORDER — UBRELVY 100 MG PO TABS
100.0000 mg | ORAL_TABLET | ORAL | 0 refills | Status: AC | PRN
  Filled 2024-07-19: qty 16, 30d supply, fill #0
  Filled 2024-11-05: qty 16, 8d supply, fill #0
  Filled 2024-11-23 – 2024-12-19 (×2): qty 16, 2d supply, fill #0

## 2024-07-20 ENCOUNTER — Other Ambulatory Visit: Payer: Self-pay

## 2024-07-20 ENCOUNTER — Other Ambulatory Visit (HOSPITAL_COMMUNITY): Payer: Self-pay

## 2024-07-20 ENCOUNTER — Encounter: Payer: Self-pay | Admitting: Nurse Practitioner

## 2024-07-20 MED ORDER — SERTRALINE HCL 50 MG PO TABS
50.0000 mg | ORAL_TABLET | Freq: Every day | ORAL | 0 refills | Status: DC
  Filled 2024-07-20: qty 90, 90d supply, fill #0

## 2024-07-21 ENCOUNTER — Telehealth: Payer: Self-pay

## 2024-07-21 ENCOUNTER — Other Ambulatory Visit: Payer: Self-pay

## 2024-07-21 ENCOUNTER — Other Ambulatory Visit (HOSPITAL_COMMUNITY): Payer: Self-pay

## 2024-07-21 DIAGNOSIS — N920 Excessive and frequent menstruation with regular cycle: Secondary | ICD-10-CM

## 2024-07-21 MED ORDER — MEDROXYPROGESTERONE ACETATE 150 MG/ML IM SUSY
1.0000 mL | PREFILLED_SYRINGE | INTRAMUSCULAR | 0 refills | Status: DC
  Filled 2024-07-21: qty 1, 90d supply, fill #0

## 2024-07-21 NOTE — Telephone Encounter (Signed)
 PT has not been seen in over a year, I have sent [facility] a message to let them know PT needs appointment to get updated chart notes in order to submit a PA.

## 2024-07-21 NOTE — Telephone Encounter (Signed)
 Pharmacy Patient Advocate Encounter   Received notification from Patient Pharmacy that prior authorization for Ubrelvy  100mg  Tablet is due for renewal.   Insurance verification completed.   The patient is insured through [facility].  Action: Patient hasn't been seen in your office in over a year. Plan requires updated chart notes for PA renewal.

## 2024-07-26 NOTE — Progress Notes (Deleted)
 Patient: Melissa Livingston Date of Birth: Feb 08, 1996  Reason for Visit: Follow up History from: Patient Primary Neurologist: Ines    ASSESSMENT AND PLAN 28 y.o. year old female with chronic migraines and remote lacunar stroke found on MRI, complete stroke wkup neg, do not take triptans in the future and stay on baby aspirin .   HISTORY OF PRESENT ILLNESS: Today 07/26/24 Update 07/28/24 SS:   HISTORY  04/26/2023: Patient having 8 migraine days a month and > 15 total headache days. Uses ubrelvy . Ajovy  and emgality  helped about 50%. Still a significant burden of migraies we can try Vyepti .    12/29/2022: MRI showed a remote lacunar infarct, patient back to discuss with fiance.  We saw patient for migraines, MRI of the brain showed a remote lacunar infarct at the left basal ganglia.  Unfortunately there is no telling how old this lacunar infarct is, could have even been in utero.  We reviewed the images together with her fianc, discussed ischemic strokes lacunar infarcts embolic type events and all the different types of cerebrovascular accidents that can happen.  Hers looks like lacunar.  Despite remote she needs a thorough evaluation including imaging of her blood vessels, a complete hypercoagulable profile, lipids, hemoglobin A1c, we have to mitigate every risk for stroke.  We discussed in detail answered all questions.   MRI brain 12/16/2022: Personally reviewed with patient and completely agree. IMPRESSION: 1. No acute intracranial abnormality. 2. 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   CTA H&N: IMPRESSION: 1. Normal CTA of the head and neck. No large vessel occlusion, hemodynamically significant stenosis, or other acute vascular abnormality. 2. No other acute intracranial abnormality. 3. Remote left basal gangliar lacunar infarct.   REVIEW OF SYSTEMS: Out of a complete 14 system review of symptoms, the patient complains only of the following symptoms, and all  other reviewed systems are negative.  See HPI  ALLERGIES: Allergies  Allergen Reactions   Adhesive [Tape] Other (See Comments)    Blisters    Lorabid [Loracarbef] Hives, Swelling and Other (See Comments)    Mouth swelling Can take amoxil  and augmentin     Latex Itching and Rash   Orange Fruit [Citrus] Rash    HOME MEDICATIONS: Outpatient Medications Prior to Visit  Medication Sig Dispense Refill   acetaminophen  (TYLENOL ) 500 MG tablet Take 2 tablets (1,000 mg total) by mouth every 6 (six) hours as needed for mild pain, moderate pain or headache. 60 tablet 0   albuterol  (VENTOLIN  HFA) 108 (90 Base) MCG/ACT inhaler Inhale 2 puffs into the lungs every 6 (six) hours as needed for wheezing or shortness of breath. 6.7 g 0   amoxicillin -clavulanate (AUGMENTIN ) 875-125 MG tablet Take 1 tablet by mouth every 12 (twelve) hours. 14 tablet 0   azelastine  (ASTELIN ) 0.1 % nasal spray Place 2 sprays into both nostrils 2 (two) times daily. 30 mL 12   chlorhexidine  (PERIDEX ) 0.12 % solution Use as directed 15 mLs in the mouth or throat 2 (two) times daily. 120 mL 0   Eptinezumab -jjmr (VYEPTI  IV) Inject into the vein every 3 (three) months.     fluconazole  (DIFLUCAN ) 150 MG tablet Take 1 tablet (150 mg total) by mouth every other day. 3 tablet 0   fluticasone  (FLONASE ) 50 MCG/ACT nasal spray Place 2 sprays into both nostrils daily. 16 g 6   hydrOXYzine  (VISTARIL ) 25 MG capsule Take 1 capsule (25 mg total) by mouth at bedtime as needed for sleep. 30 capsule 2  lidocaine  (XYLOCAINE ) 2 % solution Use as directed 10 mLs in the mouth or throat every 3 (three) hours as needed. 100 mL 0   medroxyPROGESTERone  Acetate 150 MG/ML SUSY Inject 1 mL (150 mg total) into the muscle every 3 (three) months. 1 mL 0   meloxicam  (MOBIC ) 15 MG tablet Take 1 tablet (15 mg total) by mouth daily as needed for pain. - Do not take with any otc nsaids such as ibuprofen - 30 tablet 0   Misc. Devices (WRIST BRACE) MISC Apply to both  wrists as directed for wrist pain 2 each 0   olopatadine  (PATANOL) 0.1 % ophthalmic solution Place 1 drop into both eyes 2 (two) times daily. 5 mL 12   ondansetron  (ZOFRAN -ODT) 4 MG disintegrating tablet Take 1-2 tablets (4-8 mg total) by mouth every 8 (eight) hours as needed. 30 tablet 3   sertraline  (ZOLOFT ) 50 MG tablet Take 1 tablet (50 mg total) by mouth daily. 90 tablet 0   tiZANidine  (ZANAFLEX ) 4 MG capsule Take 1 capsule (4 mg total) by mouth 3 (three) times daily as needed for muscle spasms. Do not drink alcohol or drive while taking this medication.  May cause drowsiness. 15 capsule 0   Ubrogepant  (UBRELVY ) 100 MG TABS Take 1 tablet (100 mg total) by mouth every 2 (two) hours as needed. Maximum 200mg  a day. 16 tablet 0   No facility-administered medications prior to visit.    PAST MEDICAL HISTORY: Past Medical History:  Diagnosis Date   Anemia    Anxiety    Asthma    as child   Endometritis 01/11/2015   GERD (gastroesophageal reflux disease)    Headache    Hematochezia 05/08/2015   History of ovarian cyst 01/28/2015   IBS (irritable bowel syndrome)    Infection    UTI   Migraine headache    Nausea and vomiting 01/11/2015   Ovarian cyst 01/14/2015   Pelvic pain in female 01/11/2015   Pregnancy induced hypertension    with pregnancy   Pregnant 02/05/2016   Rectal bleeding 07/18/2013   Stroke [facility])     PAST SURGICAL HISTORY: Past Surgical History:  Procedure Laterality Date   CESAREAN SECTION N/A 09/29/2014   Procedure: CESAREAN SECTION;  Surgeon: Norleen Edsel GAILS, MD;  Location: [facility];  Service: Obstetrics;  Laterality: N/A;   CESAREAN SECTION N/A 09/25/2016   Procedure: CESAREAN SECTION;  Surgeon: Norleen GAILS Edsel, MD;  Location: [facility] BIRTHING SUITES;  Service: Obstetrics;  Laterality: N/A;   CESAREAN SECTION N/A 08/25/2020   Procedure: CESAREAN SECTION;  Surgeon: Jayne Vonn DEL, MD;  Location: [facility] LD ORS;  Service: Obstetrics;  Laterality: N/A;   CHOLECYSTECTOMY N/A  03/07/2018   Procedure: LAPAROSCOPIC CHOLECYSTECTOMY;  Surgeon: Mavis Anes, MD;  Location: [facility];  Service: General;  Laterality: N/A;   COLONOSCOPY WITH PROPOFOL  N/A 08/02/2017   Dr. Shaaron: moderate internal hemorhoids, distal 5 cm of TI also appeared normal   ESOPHAGOGASTRODUODENOSCOPY (EGD) WITH ESOPHAGEAL DILATION N/A 07/19/2013   Dr. Charley appearing esophagus s/p dilation, normal D1 and D2, unremarkable path    ESOPHAGOGASTRODUODENOSCOPY (EGD) WITH PROPOFOL  N/A 08/02/2017   Normal esophagus, small hiatal hernia, normal duodenum   none     WISDOM TOOTH EXTRACTION      FAMILY HISTORY: Family History  Problem Relation Age of Onset   Multiple sclerosis Mother    Migraines Mother    Hypertension Father    Hypertension Paternal Aunt    Heart disease Paternal Uncle    Diabetes  Paternal Uncle    Hypertension Paternal Uncle    Heart disease Maternal Grandfather    Cancer Other        father's aunt had breast cancer   Colon cancer Other        maternal great uncle, age greater than 89   Crohn's disease Other        couple of family members on father's side of family    SOCIAL HISTORY: Social History   Socioeconomic History   Marital status: Significant Other    Spouse name: Not on file   Number of children: 2   Years of education: Not on file   Highest education level: Associate degree: occupational, Scientist, product/process development, or vocational program  Occupational History   Occupation: out due to knee  Tobacco Use   Smoking status: Former    Types: Cigars   Smokeless tobacco: Never  Vaping Use   Vaping status: Never Used  Substance and Sexual Activity   Alcohol use: Yes   Drug use: No   Sexual activity: Yes    Birth control/protection: Injection  Other Topics Concern   Not on file  Social History Narrative   Not on file   Social Drivers of Health   Financial Resource Strain: Low Risk  (06/16/2024)   Overall Financial Resource Strain (CARDIA)    Difficulty of Paying Living  Expenses: Not hard at all  Food Insecurity: No Food Insecurity (06/16/2024)   Hunger Vital Sign    Worried About Running Out of Food in the Last Year: Never true    Ran Out of Food in the Last Year: Never true  Transportation Needs: No Transportation Needs (06/16/2024)   PRAPARE - Administrator, Civil Service (Medical): No    Lack of Transportation (Non-Medical): No  Physical Activity: Inactive (06/16/2024)   Exercise Vital Sign    Days of Exercise per Week: 0 days    Minutes of Exercise per Session: Not on file  Stress: Stress Concern Present (06/16/2024)   [employer] of Occupational Health - Occupational Stress Questionnaire    Feeling of Stress: To some extent  Social Connections: Unknown (06/16/2024)   Social Connection and Isolation Panel    Frequency of Communication with Friends and Family: More than three times a week    Frequency of Social Gatherings with Friends and Family: Three times a week    Attends Religious Services: Not on file    Active Member of Clubs or Organizations: No    Attends Banker Meetings: Not on file    Marital Status: Living with partner  Intimate Partner Violence: Not At Risk (08/06/2023)   Humiliation, Afraid, Rape, and Kick questionnaire    Fear of Current or Ex-Partner: No    Emotionally Abused: No    Physically Abused: No    Sexually Abused: No    PHYSICAL EXAM  There were no vitals filed for this visit. There is no height or weight on file to calculate BMI.  Generalized: Well developed, in no acute distress  Neurological examination  Mentation: Alert oriented to time, place, history taking. Follows all commands speech and language fluent Cranial nerve II-XII: Pupils were equal round reactive to light. Extraocular movements were full, visual field were full on confrontational test. Facial sensation and strength were normal. Uvula tongue midline. Head turning and shoulder shrug  were normal and symmetric. Motor:  The motor testing reveals 5 over 5 strength of all 4 extremities. Good symmetric motor tone is noted  throughout.  Sensory: Sensory testing is intact to soft touch on all 4 extremities. No evidence of extinction is noted.  Coordination: Cerebellar testing reveals good finger-nose-finger and heel-to-shin bilaterally.  Gait and station: Gait is normal. Tandem gait is normal. Romberg is negative. No drift is seen.  Reflexes: Deep tendon reflexes are symmetric and normal bilaterally.   DIAGNOSTIC DATA (LABS, IMAGING, TESTING) - I reviewed patient records, labs, notes, testing and imaging myself where available.  Lab Results  Component Value Date   WBC 7.8 06/13/2024   HGB 11.3 06/13/2024   HCT 38.1 06/13/2024   MCV 76 (L) 06/13/2024   PLT 365 06/13/2024      Component Value Date/Time   NA 138 06/13/2024 1521   K 3.9 06/13/2024 1521   CL 104 06/13/2024 1521   CO2 21 06/13/2024 1521   GLUCOSE 116 (H) 06/13/2024 1521   GLUCOSE 100 (H) 12/16/2022 2015   BUN 6 06/13/2024 1521   CREATININE 0.72 06/13/2024 1521   CREATININE 0.67 09/27/2014 1330   CALCIUM  8.8 06/13/2024 1521   PROT 7.1 07/16/2023 1557   ALBUMIN 4.2 07/16/2023 1557   AST 12 07/16/2023 1557   ALT 11 07/16/2023 1557   ALKPHOS 95 07/16/2023 1557   BILITOT 0.5 07/16/2023 1557   GFRNONAA >60 12/16/2022 2015   GFRAA 99 01/22/2021 1506   Lab Results  Component Value Date   CHOL 158 04/01/2023   HDL 46 04/01/2023   LDLCALC 97 04/01/2023   TRIG 81 04/01/2023   CHOLHDL 3.4 04/01/2023   Lab Results  Component Value Date   HGBA1C 5.7 (H) 01/07/2023   No results found for: [id number] Lab Results  Component Value Date   TSH 1.410 07/16/2023    Lauraine Born, AGNP-C, DNP 07/26/2024, 4:21 PM [facility] [address] [place], [place] [address] [phone]

## 2024-07-28 ENCOUNTER — Encounter: Payer: Self-pay | Admitting: Radiology

## 2024-07-28 ENCOUNTER — Encounter: Payer: Self-pay | Admitting: Neurology

## 2024-07-28 ENCOUNTER — Ambulatory Visit: Admitting: Neurology

## 2024-07-28 NOTE — Telephone Encounter (Signed)
 PT missed appointment-I will archive the PA KEY.

## 2024-08-02 ENCOUNTER — Other Ambulatory Visit (HOSPITAL_COMMUNITY): Payer: Self-pay

## 2024-08-11 ENCOUNTER — Other Ambulatory Visit (HOSPITAL_COMMUNITY): Payer: Self-pay

## 2024-08-15 ENCOUNTER — Other Ambulatory Visit (HOSPITAL_COMMUNITY): Payer: Self-pay

## 2024-08-17 ENCOUNTER — Other Ambulatory Visit (HOSPITAL_COMMUNITY): Payer: Self-pay

## 2024-08-18 ENCOUNTER — Other Ambulatory Visit (HOSPITAL_COMMUNITY): Payer: Self-pay

## 2024-08-18 ENCOUNTER — Other Ambulatory Visit: Payer: Self-pay | Admitting: Family Medicine

## 2024-08-21 MED ORDER — ONDANSETRON HCL 4 MG PO TABS
4.0000 mg | ORAL_TABLET | Freq: Three times a day (TID) | ORAL | 0 refills | Status: DC
  Filled 2024-08-21: qty 20, 7d supply, fill #0

## 2024-08-22 ENCOUNTER — Other Ambulatory Visit: Payer: Self-pay

## 2024-08-22 ENCOUNTER — Other Ambulatory Visit (HOSPITAL_COMMUNITY): Payer: Self-pay

## 2024-08-25 ENCOUNTER — Ambulatory Visit (INDEPENDENT_AMBULATORY_CARE_PROVIDER_SITE_OTHER): Admitting: Obstetrics and Gynecology

## 2024-08-25 ENCOUNTER — Encounter: Payer: Self-pay | Admitting: Obstetrics and Gynecology

## 2024-08-25 ENCOUNTER — Other Ambulatory Visit (HOSPITAL_COMMUNITY)
Admission: RE | Admit: 2024-08-25 | Discharge: 2024-08-25 | Disposition: A | Discharge status: Home / Self Care | Source: Ambulatory Visit | Attending: Obstetrics and Gynecology | Admitting: Obstetrics and Gynecology

## 2024-08-25 VITALS — BP 116/78 | HR 85 | Ht 62.0 in | Wt 241.0 lb

## 2024-08-25 DIAGNOSIS — G43709 Chronic migraine without aura, not intractable, without status migrainosus: Secondary | ICD-10-CM | POA: Diagnosis not present

## 2024-08-25 DIAGNOSIS — Z01419 Encounter for gynecological examination (general) (routine) without abnormal findings: Secondary | ICD-10-CM

## 2024-08-25 DIAGNOSIS — Z113 Encounter for screening for infections with a predominantly sexual mode of transmission: Secondary | ICD-10-CM

## 2024-08-25 DIAGNOSIS — Z1331 Encounter for screening for depression: Secondary | ICD-10-CM | POA: Diagnosis not present

## 2024-08-25 DIAGNOSIS — N760 Acute vaginitis: Secondary | ICD-10-CM | POA: Diagnosis not present

## 2024-08-25 DIAGNOSIS — N898 Other specified noninflammatory disorders of vagina: Secondary | ICD-10-CM | POA: Insufficient documentation

## 2024-08-25 DIAGNOSIS — Z3042 Encounter for surveillance of injectable contraceptive: Secondary | ICD-10-CM

## 2024-08-25 NOTE — Progress Notes (Unsigned)
 ANNUAL EXAM Patient name: Melissa Livingston MRN [id number]  Date of birth: 02/02/1996 Chief Complaint:   Gynecologic Exam (Last pap 07-28-22 normal)  History of Present Illness:   Melissa Livingston is a 28 y.o. [phone]  female being seen today for a routine annual exam.  Current complaints: Vaginal discharge, vaginal itching, STI screen  Patient's last menstrual period was 08/18/2024.   Upstream - 08/25/24 0842       Pregnancy Intention Screening   Does the patient want to become pregnant in the next year? Unsure    Does the patient's partner want to become pregnant in the next year? No    Would the patient like to discuss contraceptive options today? No      Contraception Wrap Up   Current Method --   no birth control   Contraception Counseling Provided No         The pregnancy intention screening data noted above was reviewed. Potential methods of contraception were discussed. The patient elected to proceed with No data recorded.   Gynecologic History Patient's last menstrual period was 07/18/2024 Contraception:  Last Pap:07/2022 . Results were: Normal      08/25/2024    8:30 AM 06/16/2024    4:13 PM 02/18/2024   10:53 AM 08/06/2023    9:27 AM 07/16/2023    3:22 PM  Depression screen PHQ 2/9  Decreased Interest 0 0 1 0 0  Down, Depressed, Hopeless 0 0 2 0 0  PHQ - 2 Score 0 0 3 0 0  Altered sleeping 2 3 3 2 3   Tired, decreased energy 2 3 3 2 3   Change in appetite 2 0 3 1 0  Feeling bad or failure about yourself  0 0 0 0 0  Trouble concentrating 0 3 3 0 3  Moving slowly or fidgety/restless 2 3 2 1 3   Suicidal thoughts 0 0 0 0 0  PHQ-9 Score 8 12 17 6 12   Difficult doing work/chores  Somewhat difficult Very difficult          08/25/2024    8:30 AM 06/16/2024    4:13 PM 02/18/2024   10:53 AM 08/06/2023    9:27 AM  GAD 7 : Generalized Anxiety Score  Nervous, Anxious, on Edge 0 0 2 1  Control/stop worrying 0 0 1 0  Worry too much - different things 0 0 1 1  Trouble relaxing 2  0 1 0  Restless 2 0 2 0  Easily annoyed or irritable 2 0 2 0  Afraid - awful might happen 0 0 0 0  Total GAD 7 Score 6 0 9 2  Anxiety Difficulty  Not difficult at all Very difficult      Review of Systems:   Pertinent items are noted in HPI Denies any headaches, blurred vision, fatigue, shortness of breath, chest pain, abdominal pain, abnormal vaginal discharge/itching/odor/irritation, problems with periods, bowel movements, urination, or intercourse unless otherwise stated above. Pertinent History Reviewed:  Reviewed past medical,surgical, social and family history.  Reviewed problem list, medications and allergies. Physical Assessment:   Vitals:   08/25/24 0829  BP: 116/78  Pulse: 85  Weight: 109.3 kg  Height: 5' 2 (1.575 m)  Body mass index is 44.08 kg/m.        Physical Examination:   General appearance - well appearing, and in no distress  Mental status - alert, oriented   Psych:  She has a normal mood and affect  Skin - warm and  dry, normal color  Chest - effort normal  Breasts - breasts appear normal, no suspicious masses, no skin or nipple changes or  axillary nodes  Abdomen - soft, nontender, nondistended  Pelvic - VULVA: normal appearing vulva with no masses, tenderness or lesions  VAGINA: normal appearing vagina with normal color and discharge, no lesions  Thin prep pap is not done    UTERUS: uterus is felt to be normal size, shape, consistency and nontender CERVICAL: Deferred  ADNEXA: No adnexal masses or tenderness noted.  Extremities:  No swelling or varicosities noted  Chaperone present for exam  No results found for this or any previous visit (from the past 24 hours).  Assessment & Plan:  1. Encounter for annual routine gynecological examination (Primary) -Doing well today. BP appropriate -Last pap 07/2022 normal, next due 2026 -Currently uses withdrawal method for contraception.  -Discussed taking PNV   2. Surveillance for Depo-Provera   contraception - No longer wants to use Depo for contraception d/t concerns for weight gain  3. Vaginal discharge - Cervicovaginal ancillary only  4. Vaginal itching - Cervicovaginal ancillary only  5. Screen for STD (sexually transmitted disease) - RPR+HBsAg+HCVAb+...   6. Chronic migraine with aura without status migrainosus, not intractable -Discussed contraindication for estrogen-containing contraceptive methods, may consider progestin-only pills   Orders Placed This Encounter  Procedures   RPR+HBsAg+HCVAb+...    Meds: No orders of the defined types were placed in this encounter.   Follow-up: Return in about 1 year (around 08/25/2025) for [place] Rolin GORMAN Ramonita, RN 08/25/2024 9:14 AM

## 2024-08-25 NOTE — Progress Notes (Deleted)
 ANNUAL EXAM Patient name: Melissa Livingston MRN [id number]  Date of birth: 1996-09-19 Chief Complaint:   Gynecologic Exam (Last pap 07-28-22 normal)  History of Present Illness:   Melissa Livingston is a 28 y.o. [phone]  female being seen today for a routine annual exam.  Current complaints:  Hx of stroke   No LMP recorded. Patient has had an injection.   The pregnancy intention screening data noted above was reviewed. Potential methods of contraception were discussed. The patient elected to proceed with No data recorded.   Gynecologic History Patient's last menstrual period was . Contraception:  Last Pap: 2023. Results were: NILM Last mammogram:n/a     08/25/2024    8:30 AM 06/16/2024    4:13 PM 02/18/2024   10:53 AM 08/06/2023    9:27 AM 07/16/2023    3:22 PM  Depression screen PHQ 2/9  Decreased Interest 0 0 1 0 0  Down, Depressed, Hopeless 0 0 2 0 0  PHQ - 2 Score 0 0 3 0 0  Altered sleeping 2 3 3 2 3   Tired, decreased energy 2 3 3 2 3   Change in appetite 2 0 3 1 0  Feeling bad or failure about yourself  0 0 0 0 0  Trouble concentrating 0 3 3 0 3  Moving slowly or fidgety/restless 2 3 2 1 3   Suicidal thoughts 0 0 0 0 0  PHQ-9 Score 8 12 17 6 12   Difficult doing work/chores  Somewhat difficult Very difficult          08/25/2024    8:30 AM 06/16/2024    4:13 PM 02/18/2024   10:53 AM 08/06/2023    9:27 AM  GAD 7 : Generalized Anxiety Score  Nervous, Anxious, on Edge 0 0 2 1  Control/stop worrying 0 0 1 0  Worry too much - different things 0 0 1 1  Trouble relaxing 2 0 1 0  Restless 2 0 2 0  Easily annoyed or irritable 2 0 2 0  Afraid - awful might happen 0 0 0 0  Total GAD 7 Score 6 0 9 2  Anxiety Difficulty  Not difficult at all Very difficult      Review of Systems:   Pertinent items are noted in HPI Denies any headaches, blurred vision, fatigue, shortness of breath, chest pain, abdominal pain, abnormal vaginal discharge/itching/odor/irritation, problems with periods,  bowel movements, urination, or intercourse unless otherwise stated above. Pertinent History Reviewed:  Reviewed past medical,surgical, social and family history.  Reviewed problem list, medications and allergies. Physical Assessment:   Vitals:   08/25/24 0829  BP: 116/78  Pulse: 85  Weight: 241 lb (109.3 kg)  Height: 5' 2 (1.575 m)  Body mass index is 44.08 kg/m.        Physical Examination:   General appearance - well appearing, and in no distress  Mental status - alert, oriented   Psych:  She has a normal mood and affect  Skin - warm and dry, normal color  Chest - effort normal, all lung fields clear to auscultation bilaterally  Heart - normal rate and regular rhythm  Neck:  midline trachea, no thyromegaly or nodules  Breasts - breasts appear normal, no suspicious masses, no skin or nipple changes or  axillary nodes  Abdomen - soft, nontender, nondistended  Pelvic - VULVA: normal appearing vulva with no masses, tenderness or lesions  VAGINA: normal appearing vagina with normal color and discharge, no lesions  CERVIX: normal  appearing cervix without discharge or lesions, no CMT  Thin prep pap is {Desc; done/not:10129} *** HR HPV cotesting  UTERUS: uterus is felt to be normal size, shape, consistency and nontender   ADNEXA: No adnexal masses or tenderness noted.  Extremities:  No swelling or varicosities noted  Chaperone present for exam  No results found for this or any previous visit (from the past 24 hours).  Assessment & Plan:  1) Well-Woman Exam  2) ***  Labs/procedures today: ***  Mammogram: {Mammo f/u:25212::@ 30yo}, or sooner if problems Colonoscopy: {TCS f/u:25213::@ 30yo}, or sooner if problems  No orders of the defined types were placed in this encounter.   Meds: No orders of the defined types were placed in this encounter.   Follow-up: No follow-ups on file.  Nidia Daring, FNP 08/25/2024 8:37 AM

## 2024-08-26 LAB — RPR+HBSAG+HCVAB+...
HIV Screen 4th Generation wRfx: NONREACTIVE
Hep C Virus Ab: NONREACTIVE
Hepatitis B Surface Ag: NEGATIVE
RPR Ser Ql: NONREACTIVE

## 2024-08-28 ENCOUNTER — Ambulatory Visit: Payer: Self-pay | Admitting: Obstetrics and Gynecology

## 2024-08-28 LAB — CERVICOVAGINAL ANCILLARY ONLY
Bacterial Vaginitis (gardnerella): POSITIVE — AB
Candida Glabrata: NEGATIVE
Candida Vaginitis: NEGATIVE
Chlamydia: NEGATIVE
Comment: NEGATIVE
Comment: NEGATIVE
Comment: NEGATIVE
Comment: NEGATIVE
Comment: NEGATIVE
Comment: NORMAL
Neisseria Gonorrhea: NEGATIVE
Trichomonas: NEGATIVE

## 2024-08-28 MED ORDER — METRONIDAZOLE 500 MG PO TABS: 500.0000 mg | ORAL_TABLET | Freq: Two times a day (BID) | ORAL | 0 refills | Status: AC

## 2024-08-31 NOTE — Progress Notes (Incomplete)
 Cardiology Office Note:   Date:  08/31/2024  ID:  Melissa Livingston, Melissa Livingston [birth year], MRN [id number] PCP: Alphonsa Glendia LABOR, MD  [facility] Providers Cardiologist:  None { Chief Complaint: No chief complaint on file.     History of Present Illness:   Melissa Livingston is a 28 y.o. female with a PMH of morbid obesity who presents as a new patient referral by Dr. Alphonsa for the evaluation of tachycardia.  She presented to UC on 06/13/2024 with a complaint of feeling her heart is racing.  EKG was notable for sinus tachycardia without ischemic changes.  CXR was negative for cardiopulmonary process.  She has had a prior echocardiogram in 2024 which was largely unremarkable.  She wore a 30-day event monitor in 2024 which revealed an average heart rate of 95 bpm and multiple episodes of sinus tachycardia.  There were no critical events however, each patient selected event was associated with sinus tachycardia.   Past Medical History:  Diagnosis Date   Anemia    Anxiety    Asthma    as child   Endometritis 01/11/2015   GERD (gastroesophageal reflux disease)    Headache    Hematochezia 05/08/2015   History of ovarian cyst 01/28/2015   IBS (irritable bowel syndrome)    Infection    UTI   Migraine headache    Nausea and vomiting 01/11/2015   Ovarian cyst 01/14/2015   Pelvic pain in female 01/11/2015   Pregnancy induced hypertension    with pregnancy   Pregnant 02/05/2016   Rectal bleeding 07/18/2013   Stroke [facility])      Studies Reviewed:    EKG: ***       Cardiac Studies & Procedures   ______________________________________________________________________________________________     ECHOCARDIOGRAM  ECHOCARDIOGRAM COMPLETE BUBBLE STUDY 01/07/2023  Narrative ECHOCARDIOGRAM REPORT    Patient Name:   Melissa Livingston   Date of Exam: 01/07/2023 Medical Rec #:  [id number]     Height:       62.0 in Accession #:    [id number]    Weight:       205.2 lb Date of Birth:  1996-01-05     BSA:           1.933 m Patient Age:    26 years      BP:           121/75 mmHg Patient Gender: F             HR:           84 bpm. Exam Location:  [address]  Procedure: 2D Echo, Cardiac Doppler, Color Doppler and Saline Contrast Bubble Study  Indications:     I63.9 CVA  History:         Patient has prior history of Echocardiogram examinations, most recent 03/19/2021. Stroke; Risk Factors:Dyslipidemia.  Sonographer:     Elsie Bohr RDCS Referring Phys:  [id number] ONETHA KATHEE EPP Diagnosing Phys: Powell Sorrow MD  IMPRESSIONS   1. There was one bubble seen after 10 cardiac cycles on agitated saline bubble study at rest and one bubble seen after 11 cardiac cycles with agitated saline with valsalva. This is most consistent with an intrapulmonary shunt with lower suspicion of clinically significant PFO. Agitated saline contrast bubble study was negative, with no evidence of any interatrial shunt. 2. Left ventricular ejection fraction, by estimation, is 60 to 65%. The left ventricle has normal function. The left ventricle has no regional wall motion abnormalities.  Left ventricular diastolic parameters were normal. 3. The mitral valve is normal in structure. Trivial mitral valve regurgitation. 4. Right ventricular systolic function is normal. The right ventricular size is normal. 5. The aortic valve is tricuspid. Aortic valve regurgitation is not visualized. No aortic stenosis is present. 6. The inferior vena cava is normal in size with greater than 50% respiratory variability, suggesting right atrial pressure of 3 mmHg.  Comparison(s): No significant change from prior study.  FINDINGS Left Ventricle: Left ventricular ejection fraction, by estimation, is 60 to 65%. The left ventricle has normal function. The left ventricle has no regional wall motion abnormalities. The left ventricular internal cavity size was normal in size. There is no left ventricular hypertrophy. Left ventricular  diastolic parameters were normal.  Right Ventricle: The right ventricular size is normal. No increase in right ventricular wall thickness. Right ventricular systolic function is normal.  Left Atrium: Left atrial size was normal in size.  Right Atrium: Right atrial size was normal in size.  Pericardium: There is no evidence of pericardial effusion.  Mitral Valve: The mitral valve is normal in structure. Trivial mitral valve regurgitation.  Tricuspid Valve: The tricuspid valve is normal in structure. Tricuspid valve regurgitation is trivial.  Aortic Valve: The aortic valve is tricuspid. Aortic valve regurgitation is not visualized. No aortic stenosis is present.  Pulmonic Valve: The pulmonic valve was normal in structure. Pulmonic valve regurgitation is trivial.  Aorta: The aortic root and ascending aorta are structurally normal, with no evidence of dilitation.  Venous: The inferior vena cava is normal in size with greater than 50% respiratory variability, suggesting right atrial pressure of 3 mmHg.  IAS/Shunts: The atrial septum is grossly normal. Agitated saline contrast was given intravenously to evaluate for intracardiac shunting. Agitated saline contrast bubble study was negative, with no evidence of any interatrial shunt.   LEFT VENTRICLE PLAX 2D LVIDd:         4.90 cm   Diastology LVIDs:         2.80 cm   LV e' medial:    14.00 cm/s LV PW:         0.90 cm   LV E/e' medial:  9.4 LV IVS:        0.90 cm   LV e' lateral:   26.00 cm/s LVOT diam:     1.80 cm   LV E/e' lateral: 5.1 LV SV:         61 LV SV Index:   31 LVOT Area:     2.54 cm   RIGHT VENTRICLE             IVC RV S prime:     17.40 cm/s  IVC diam: 1.50 cm TAPSE (M-mode): 1.9 cm RVSP:           24.3 mmHg  LEFT ATRIUM             Index        RIGHT ATRIUM           Index LA diam:        3.50 cm 1.81 cm/m   RA Pressure: 3.00 mmHg LA Vol (A2C):   47.2 ml 24.42 ml/m  RA Area:     9.96 cm LA Vol (A4C):   28.0 ml  14.49 ml/m  RA Volume:   21.60 ml  11.18 ml/m LA Biplane Vol: 36.6 ml 18.94 ml/m AORTIC VALVE LVOT Vmax:   113.00 cm/s LVOT Vmean:  78.800 cm/s LVOT VTI:  0.239 m  AORTA Ao Root diam: 2.70 cm Ao Asc diam:  2.70 cm  MITRAL VALVE                TRICUSPID VALVE MV Area (PHT): 4.86 cm     TR Peak grad:   21.3 mmHg MV Decel Time: 156 msec     TR Vmax:        231.00 cm/s MV E velocity: 132.00 cm/s  Estimated RAP:  3.00 mmHg MV A velocity: 91.20 cm/s   RVSP:           24.3 mmHg MV E/A ratio:  1.45 SHUNTS Systemic VTI:  0.24 m Systemic Diam: 1.80 cm  Powell Sorrow MD Electronically signed by Powell Sorrow MD Signature Date/Time: 01/07/2023/11:31:35 AM    Final (Updated)    MONITORS  CARDIAC EVENT MONITOR 02/11/2023  Narrative Sinus rhythm HR 61-164 bpm, avg 95 Multiple symptom episodes correlated with sinus tachycardia       ______________________________________________________________________________________________      Risk Assessment/Calculations:   {Does this patient have ATRIAL FIBRILLATION?[phone]} No BP recorded.  {Refresh Note OR Click here to enter BP  :1}***        Physical Exam:     VS:  LMP 08/18/2024  ***    Wt Readings from Last 3 Encounters:  08/25/24 241 lb (109.3 kg)  06/16/24 247 lb (112 kg)  02/18/24 232 lb 9.6 oz (105.5 kg)     GEN: Well nourished, well developed, in no acute distress NECK: No JVD; No carotid bruits CARDIAC: ***RRR, no murmurs, rubs, gallops RESPIRATORY:  Clear to auscultation without rales, wheezing or rhonchi  ABDOMEN: Soft, non-tender, non-distended, normal bowel sounds EXTREMITIES:  Warm and well perfused, no edema; No deformity, 2+ radial pulses PSYCH: Normal mood and affect   Assessment & Plan   -maybe propanolol for anxiety***    {Are you ordering a CV Procedure (e.g. stress test, cath, DCCV, TEE, etc)?   Press F2        :[id number]}   This note was written with the assistance of a dictation  microphone or AI dictation software. Please excuse any typos or grammatical errors.   Signed, Georganna Archer, MD 08/31/2024 2:41 PM     [facility]

## 2024-09-01 ENCOUNTER — Ambulatory Visit
Attending: Student in an Organized Health Care Education/Training Program | Admitting: Student in an Organized Health Care Education/Training Program

## 2024-09-13 ENCOUNTER — Encounter: Payer: Self-pay | Admitting: Emergency Medicine

## 2024-09-13 ENCOUNTER — Ambulatory Visit
Admission: EM | Admit: 2024-09-13 | Discharge: 2024-09-13 | Disposition: A | Discharge status: Home / Self Care | Attending: Physician Assistant | Admitting: Physician Assistant

## 2024-09-13 DIAGNOSIS — N390 Urinary tract infection, site not specified: Secondary | ICD-10-CM | POA: Insufficient documentation

## 2024-09-13 LAB — POCT URINE DIPSTICK
Glucose, UA: NEGATIVE mg/dL
Ketones, POC UA: NEGATIVE mg/dL
Nitrite, UA: NEGATIVE
Spec Grav, UA: 1.025 (ref 1.010–1.025)
Urobilinogen, UA: 1 U/dL
pH, UA: 6 (ref 5.0–8.0)

## 2024-09-13 LAB — POCT URINE PREGNANCY: Preg Test, Ur: NEGATIVE

## 2024-09-13 MED ORDER — SULFAMETHOXAZOLE-TRIMETHOPRIM 800-160 MG PO TABS: 1.0000 | ORAL_TABLET | Freq: Two times a day (BID) | ORAL | 0 refills | Status: AC

## 2024-09-13 MED ORDER — CEFTRIAXONE SODIUM 1 G IJ SOLR
1.0000 g | Freq: Once | INTRAMUSCULAR | Status: AC
  Administered 2024-09-13: 1 g via INTRAMUSCULAR

## 2024-09-13 MED ORDER — FLUCONAZOLE 150 MG PO TABS: 150.0000 mg | ORAL_TABLET | ORAL | 0 refills | Status: AC | PRN

## 2024-09-13 NOTE — Discharge Instructions (Addendum)
 We are treating you for urinary tract infection.  We given injection of Rocephin  today.  Please start Bactrim  DS twice daily.  If you develop any rash or lesions stop the medication and be seen immediately.  I have called in Diflucan  to have on hand in case you develop any yeast infection symptoms.  Will contact you if need to stop or change her antibiotics based on culture results.  If you develop any fever, nausea, vomiting, pelvic pain, abdominal pain, weakness you need to go to the ER.

## 2024-09-13 NOTE — ED Triage Notes (Signed)
 Lower back pain and lower abd pain x 3 days.  States urine is cloudy.

## 2024-09-13 NOTE — ED Provider Notes (Signed)
 [facility]    CSN: [id number] Arrival date & time: 09/13/24  1357      History   Chief Complaint No chief complaint on file.   HPI Melissa Livingston is a 28 y.o. female.   Patient presents today with 3-day history of lower back pain, lower abdominal pain, frequency/urgency.  She does have a history of UTI with similar presentation.  She denies any dysuria, hematuria, fever, nausea, vomiting.  Denies any history of diabetes and does not take SGLT2 inhibitor.  She denies any recent antibiotics in the past 90 days outside of a course of metronidazole  for BV.  She denies any recent urogenital procedure or self-catheterization.  She does not believe that she could be pregnant as she is on Depo-Provera  but has not had a menstrual cycle recently.  She reports that her lower back pain is rated 6 on a 0-10 pain scale, described as aching, no aggravating relieving factors identified.  She denies any change in activity or known trauma before symptoms began.     Past Medical History:  Diagnosis Date   Anemia    Anxiety    Asthma    as child   Endometritis 01/11/2015   GERD (gastroesophageal reflux disease)    Headache    Hematochezia 05/08/2015   History of ovarian cyst 01/28/2015   IBS (irritable bowel syndrome)    Infection    UTI   Migraine headache    Nausea and vomiting 01/11/2015   Ovarian cyst 01/14/2015   Pelvic pain in female 01/11/2015   Pregnancy induced hypertension    with pregnancy   Pregnant 02/05/2016   Rectal bleeding 07/18/2013   Stroke [facility])     Patient Active Problem List   Diagnosis Date Noted   Sinus tachycardia 06/17/2024   Bilateral carpal tunnel syndrome 04/18/2024   Adjustment disorder with mixed anxiety and depressed mood 02/19/2024   Menorrhagia with regular cycle 08/06/2023   Iron  deficiency anemia due to chronic blood loss 07/16/2023   Chronic migraine without aura without status migrainosus, not intractable 11/23/2022   Dyspareunia,  female 07/28/2022   Chronic hypertension 03/21/2020   Chronic pain of both knees 08/25/2018   Constipation 08/25/2018   Acute left-sided low back pain with left-sided sciatica 01/09/2017   Morbid obesity (HCC) 01/09/2017   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   GERD (gastroesophageal reflux disease) 07/18/2013   Irritable bowel syndrome 07/03/2013   Depression with anxiety 07/03/2013   Asthma 03/27/2013    Past Surgical History:  Procedure Laterality Date   CESAREAN SECTION N/A 09/29/2014   Procedure: CESAREAN SECTION;  Surgeon: Norleen Edsel GAILS, MD;  Location: [facility];  Service: Obstetrics;  Laterality: N/A;   CESAREAN SECTION N/A 09/25/2016   Procedure: CESAREAN SECTION;  Surgeon: Norleen GAILS Edsel, MD;  Location: [facility];  Service: Obstetrics;  Laterality: N/A;   CESAREAN SECTION N/A 08/25/2020   Procedure: CESAREAN SECTION;  Surgeon: Jayne Vonn DEL, MD;  Location: [facility] LD ORS;  Service: Obstetrics;  Laterality: N/A;   CHOLECYSTECTOMY N/A 03/07/2018   Procedure: LAPAROSCOPIC CHOLECYSTECTOMY;  Surgeon: Mavis Anes, MD;  Location: [facility];  Service: General;  Laterality: N/A;   COLONOSCOPY WITH PROPOFOL  N/A 08/02/2017   Dr. Shaaron: moderate internal hemorhoids, distal 5 cm of TI also appeared normal   ESOPHAGOGASTRODUODENOSCOPY (EGD) WITH ESOPHAGEAL DILATION N/A 07/19/2013   Dr. Charley appearing esophagus s/p dilation, normal D1 and D2, unremarkable path    ESOPHAGOGASTRODUODENOSCOPY (EGD) WITH PROPOFOL  N/A 08/02/2017  Normal esophagus, small hiatal hernia, normal duodenum   none     WISDOM TOOTH EXTRACTION      OB History     Gravida  3   Para  3   Term  3   Preterm      AB      Living  3      SAB      IAB      Ectopic      Multiple  0   Live Births  3            Home Medications    Prior to Admission medications   Medication Sig Start Date End Date Taking? Authorizing Provider  fluconazole  (DIFLUCAN ) 150 MG tablet Take 1  tablet (150 mg total) by mouth every 3 (three) days as needed. 09/13/24  Yes Katyra Tomassetti K, PA-C  sulfamethoxazole -trimethoprim  (BACTRIM  DS) 800-160 MG tablet Take 1 tablet by mouth 2 (two) times daily for 5 days. 09/13/24 09/18/24 Yes Dashaun Onstott K, PA-C  acetaminophen  (TYLENOL ) 500 MG tablet Take 2 tablets (1,000 mg total) by mouth every 6 (six) hours as needed for mild pain, moderate pain or headache. 08/26/20   Cecilia Bernarda BROCKS, MD  albuterol  (VENTOLIN  HFA) 108 [phone] Base) MCG/ACT inhaler Inhale 2 puffs into the lungs every 6 (six) hours as needed for wheezing or shortness of breath. 05/22/24   Alphonsa Glendia LABOR, MD  azelastine  (ASTELIN ) 0.1 % nasal spray Place 2 sprays into both nostrils 2 (two) times daily. 05/22/24   Alphonsa Glendia LABOR, MD  Eptinezumab -jjmr (VYEPTI  IV) Inject into the vein every 3 (three) months.    [provider]  fluticasone  (FLONASE ) 50 MCG/ACT nasal spray Place 2 sprays into both nostrils daily. 03/10/23   Kennyth Domino, FNP  hydrOXYzine  (VISTARIL ) 25 MG capsule Take 1 capsule (25 mg total) by mouth at bedtime as needed for sleep. 02/18/24   Mauro Elveria BROCKS, NP  meloxicam  (MOBIC ) 15 MG tablet Take 1 tablet (15 mg total) by mouth daily as needed for pain. - Do not take with any otc nsaids such as ibuprofen - 06/07/24   Alphonsa Glendia LABOR, MD  Misc. Devices (WRIST BRACE) MISC Apply to both wrists as directed for wrist pain 04/18/24   Mauro Elveria BROCKS, NP  olopatadine  (PATANOL) 0.1 % ophthalmic solution Place 1 drop into both eyes 2 (two) times daily. 03/10/23   Kennyth Domino, FNP  ondansetron  (ZOFRAN ) 4 MG tablet Take 1 tablet (4 mg total) by mouth every 8 (eight) hours as needed for nausea or vomiting. 08/21/24 06/06/25  Mauro Elveria BROCKS, NP  ondansetron  (ZOFRAN -ODT) 4 MG disintegrating tablet Take 1-2 tablets (4-8 mg total) by mouth every 8 (eight) hours as needed. 11/23/22   Ines Onetha NOVAK, MD  sertraline  (ZOLOFT ) 50 MG tablet Take 1 tablet (50 mg total) by mouth daily. 07/20/24    Mauro Elveria BROCKS, NP  Ubrogepant  (UBRELVY ) 100 MG TABS Take 1 tablet (100 mg total) by mouth every 2 (two) hours as needed. Maximum 200mg  a day. 07/19/24   Ines Onetha NOVAK, MD    Family History Family History  Adopted: Yes  Problem Relation Age of Onset   Multiple sclerosis Mother    Migraines Mother    Hypertension Father    Heart disease Maternal Grandfather    Hypertension Paternal Aunt    Heart disease Paternal Uncle    Diabetes Paternal Uncle    Hypertension Paternal Uncle    Cancer Other  father's aunt had breast cancer   Colon cancer Other        maternal great uncle, age greater than 14   Crohn's disease Other        couple of family members on father's side of family    Social History Social History   Tobacco Use   Smoking status: Former    Types: Cigars   Smokeless tobacco: Never  Vaping Use   Vaping status: Never Used  Substance Use Topics   Alcohol use: Yes   Drug use: No     Allergies   Adhesive [tape], Lorabid [loracarbef], Latex, and Orange fruit [citrus]   Review of Systems Review of Systems  Constitutional:  Positive for activity change. Negative for appetite change, fatigue and fever.  Gastrointestinal:  Negative for abdominal pain, diarrhea, nausea and vomiting.  Genitourinary:  Positive for frequency and urgency. Negative for dysuria and flank pain.  Musculoskeletal:  Positive for back pain. Negative for arthralgias and myalgias.     Physical Exam Triage Vital Signs ED Triage Vitals  Encounter Vitals Group     BP 09/13/24 1411 128/83     Girls Systolic BP Percentile --      Girls Diastolic BP Percentile --      Boys Systolic BP Percentile --      Boys Diastolic BP Percentile --      Pulse Rate 09/13/24 1411 91     Resp 09/13/24 1411 18     Temp 09/13/24 1411 98.4 F (36.9 C)     Temp Source 09/13/24 1411 Oral     SpO2 09/13/24 1411 98 %     Weight --      Height --      Head Circumference --      Peak Flow --       Pain Score 09/13/24 1407 6     Pain Loc --      Pain Education --      Exclude from Growth Chart --    No data found.  Updated Vital Signs BP 128/83 (BP Location: Right Arm)   Pulse 91   Temp 98.4 F (36.9 C) (Oral)   Resp 18   LMP 07/18/2024 (Exact Date)   SpO2 98%   Visual Acuity Right Eye Distance:   Left Eye Distance:   Bilateral Distance:    Right Eye Near:   Left Eye Near:    Bilateral Near:     Physical Exam Vitals reviewed.  Constitutional:      General: She is awake. She is not in acute distress.    Appearance: Normal appearance. She is well-developed. She is not ill-appearing.     Comments: Very pleasant female appears stated age in no acute distress sitting comfortably in exam room  HENT:     Head: Normocephalic and atraumatic.  Cardiovascular:     Rate and Rhythm: Normal rate and regular rhythm.     Heart sounds: Normal heart sounds, S1 normal and S2 normal. No murmur heard. Pulmonary:     Effort: Pulmonary effort is normal.     Breath sounds: Normal breath sounds. No wheezing, rhonchi or rales.     Comments: Clear to auscultation bilaterally Abdominal:     General: Bowel sounds are normal.     Palpations: Abdomen is soft.     Tenderness: There is no abdominal tenderness. There is no right CVA tenderness, left CVA tenderness, guarding or rebound.     Comments: Benign abdominal exam.  Psychiatric:  Behavior: Behavior is cooperative.      UC Treatments / Results  Labs (all labs ordered are listed, but only abnormal results are displayed) Labs Reviewed  POCT URINE DIPSTICK - Abnormal; Notable for the following components:      Result Value   Bilirubin, UA small (*)    Blood, UA moderate (*)    Leukocytes, UA Trace (*)    All other components within normal limits  URINE CULTURE  POCT URINE PREGNANCY    EKG   Radiology No results found.  Procedures Procedures (including critical care time)  Medications Ordered in UC Medications   cefTRIAXone  (ROCEPHIN ) injection 1 g (1 g Intramuscular Given 09/13/24 1438)    Initial Impression / Assessment and Plan / UC Course  I have reviewed the triage vital signs and the nursing notes.  Pertinent labs & imaging results that were available during my care of the patient were reviewed by me and considered in my medical decision making (see chart for details).     Patient is well-appearing, afebrile, nontoxic, nontachycardic.  Vital signs and physical exam are reassuring with no indication for emergent evaluation or imaging.  Urine pregnancy was negative.  UA was obtained that was consistent with urinary tract infection.  She was given 1 g of Rocephin  as she would not be able to get her medication for a few days but will also start Bactrim  DS twice daily for 5 days.  No indication for dose adjustment based on metabolic panel from 06/13/2024 with creatinine of 0.72 calculated creatinine clearance of 197 mL/min.  We discussed that if develops any rash or oral lesion she needs to stop the medication.  Will send this for culture and contact her if we need to discontinue or change her antibiotics based on culture results.  She reports often getting yeast infection with antibiotic use and so requested to have Diflucan  on hand.  2 doses were sent to pharmacy per her request.  Recommend that she rest and drink plenty of fluid.  Discussed that if anything changes or worsens and she has a severe abdominal pain, fever, nausea, vomiting, weakness, persistent or worsening urinary symptoms she needs to be seen immediately.  Strict return precautions given.     Final Clinical Impressions(s) / UC Diagnoses   Final diagnoses:  Acute UTI     Discharge Instructions      We are treating you for urinary tract infection.  We given injection of Rocephin  today.  Please start Bactrim  DS twice daily.  If you develop any rash or lesions stop the medication and be seen immediately.  I have called in Diflucan  to  have on hand in case you develop any yeast infection symptoms.  Will contact you if need to stop or change her antibiotics based on culture results.  If you develop any fever, nausea, vomiting, pelvic pain, abdominal pain, weakness you need to go to the ER.     ED Prescriptions     Medication Sig Dispense Auth. Provider   sulfamethoxazole -trimethoprim  (BACTRIM  DS) 800-160 MG tablet Take 1 tablet by mouth 2 (two) times daily for 5 days. 10 tablet Dayvion Sans K, PA-C   fluconazole  (DIFLUCAN ) 150 MG tablet Take 1 tablet (150 mg total) by mouth every 3 (three) days as needed. 2 tablet Tyla Burgner K, PA-C      PDMP not reviewed this encounter.   Sherrell Rocky POUR, PA-C 09/13/24 1440

## 2024-09-15 ENCOUNTER — Ambulatory Visit (HOSPITAL_COMMUNITY): Payer: Self-pay

## 2024-09-15 LAB — URINE CULTURE: Culture: 60000 — AB

## 2024-10-09 ENCOUNTER — Encounter: Payer: Self-pay | Admitting: Radiology

## 2024-11-05 ENCOUNTER — Other Ambulatory Visit (HOSPITAL_BASED_OUTPATIENT_CLINIC_OR_DEPARTMENT_OTHER): Payer: Self-pay

## 2024-11-05 ENCOUNTER — Other Ambulatory Visit: Payer: Self-pay | Admitting: Nurse Practitioner

## 2024-11-05 ENCOUNTER — Other Ambulatory Visit: Payer: Self-pay | Admitting: Family Medicine

## 2024-11-06 ENCOUNTER — Other Ambulatory Visit (HOSPITAL_BASED_OUTPATIENT_CLINIC_OR_DEPARTMENT_OTHER): Payer: Self-pay

## 2024-11-06 MED ORDER — ONDANSETRON HCL 4 MG PO TABS
4.0000 mg | ORAL_TABLET | Freq: Three times a day (TID) | ORAL | 3 refills | Status: AC
  Filled 2024-11-06 – 2024-12-19 (×3): qty 20, 7d supply, fill #0

## 2024-11-06 MED ORDER — SERTRALINE HCL 50 MG PO TABS
50.0000 mg | ORAL_TABLET | Freq: Every day | ORAL | 0 refills | Status: AC
  Filled 2024-11-06 – 2024-12-19 (×2): qty 90, 90d supply, fill #0

## 2024-11-06 MED ORDER — MELOXICAM 15 MG PO TABS
15.0000 mg | ORAL_TABLET | Freq: Every day | ORAL | 0 refills | Status: AC | PRN
  Filled 2024-11-06 – 2024-12-19 (×2): qty 30, 30d supply, fill #0

## 2024-11-17 ENCOUNTER — Other Ambulatory Visit (HOSPITAL_BASED_OUTPATIENT_CLINIC_OR_DEPARTMENT_OTHER): Payer: Self-pay

## 2024-11-23 ENCOUNTER — Other Ambulatory Visit (HOSPITAL_BASED_OUTPATIENT_CLINIC_OR_DEPARTMENT_OTHER): Payer: Self-pay

## 2024-11-24 ENCOUNTER — Other Ambulatory Visit (HOSPITAL_BASED_OUTPATIENT_CLINIC_OR_DEPARTMENT_OTHER): Payer: Self-pay

## 2024-11-27 ENCOUNTER — Other Ambulatory Visit (HOSPITAL_BASED_OUTPATIENT_CLINIC_OR_DEPARTMENT_OTHER): Payer: Self-pay

## 2024-11-28 ENCOUNTER — Other Ambulatory Visit (HOSPITAL_BASED_OUTPATIENT_CLINIC_OR_DEPARTMENT_OTHER): Payer: Self-pay

## 2024-12-04 ENCOUNTER — Other Ambulatory Visit (HOSPITAL_BASED_OUTPATIENT_CLINIC_OR_DEPARTMENT_OTHER): Payer: Self-pay

## 2024-12-05 ENCOUNTER — Other Ambulatory Visit (HOSPITAL_BASED_OUTPATIENT_CLINIC_OR_DEPARTMENT_OTHER): Payer: Self-pay

## 2024-12-19 ENCOUNTER — Other Ambulatory Visit (HOSPITAL_COMMUNITY): Payer: Self-pay

## 2024-12-19 ENCOUNTER — Telehealth: Payer: Self-pay | Admitting: *Deleted

## 2024-12-19 ENCOUNTER — Other Ambulatory Visit (HOSPITAL_BASED_OUTPATIENT_CLINIC_OR_DEPARTMENT_OTHER): Payer: Self-pay

## 2024-12-19 ENCOUNTER — Other Ambulatory Visit: Payer: Self-pay

## 2024-12-19 NOTE — Telephone Encounter (Signed)
 Pt asking for refill of her ubrelvy .  Last seen by Dr. Ines 04-26-2023.  Pt will need new appt.  Dr. Onita is her new neurologist.  They will relay to pt to call and make appt. Her ubrelvy  was up for a PA renewal.
